# Patient Record
Sex: Female | Born: 1950
Health system: Southern US, Community
[De-identification: ages and names within clinical notes are randomized; demographics above are authoritative.]

## PROBLEM LIST (undated history)

## (undated) DIAGNOSIS — G629 Polyneuropathy, unspecified: Secondary | ICD-10-CM

## (undated) DIAGNOSIS — D649 Anemia, unspecified: Secondary | ICD-10-CM

## (undated) DIAGNOSIS — K5792 Diverticulitis of intestine, part unspecified, without perforation or abscess without bleeding: Secondary | ICD-10-CM

## (undated) DIAGNOSIS — F32A Depression, unspecified: Secondary | ICD-10-CM

## (undated) DIAGNOSIS — M869 Osteomyelitis, unspecified: Secondary | ICD-10-CM

## (undated) DIAGNOSIS — E11319 Type 2 diabetes mellitus with unspecified diabetic retinopathy without macular edema: Secondary | ICD-10-CM

## (undated) DIAGNOSIS — Z87442 Personal history of urinary calculi: Secondary | ICD-10-CM

## (undated) DIAGNOSIS — M5136 Other intervertebral disc degeneration, lumbar region: Secondary | ICD-10-CM

## (undated) DIAGNOSIS — E559 Vitamin D deficiency, unspecified: Secondary | ICD-10-CM

## (undated) DIAGNOSIS — N189 Chronic kidney disease, unspecified: Secondary | ICD-10-CM

## (undated) DIAGNOSIS — I10 Essential (primary) hypertension: Secondary | ICD-10-CM

## (undated) DIAGNOSIS — M51369 Other intervertebral disc degeneration, lumbar region without mention of lumbar back pain or lower extremity pain: Secondary | ICD-10-CM

## (undated) DIAGNOSIS — I6381 Other cerebral infarction due to occlusion or stenosis of small artery: Secondary | ICD-10-CM

## (undated) DIAGNOSIS — E538 Deficiency of other specified B group vitamins: Secondary | ICD-10-CM

## (undated) DIAGNOSIS — F329 Major depressive disorder, single episode, unspecified: Secondary | ICD-10-CM

## (undated) DIAGNOSIS — Z972 Presence of dental prosthetic device (complete) (partial): Secondary | ICD-10-CM

## (undated) HISTORY — DX: Deficiency of other specified B group vitamins: E53.8

## (undated) HISTORY — DX: Other cerebral infarction due to occlusion or stenosis of small artery: I63.81

## (undated) HISTORY — PX: TOE AMPUTATION: SHX809

## (undated) HISTORY — DX: Polyneuropathy, unspecified: G62.9

## (undated) HISTORY — DX: Other intervertebral disc degeneration, lumbar region without mention of lumbar back pain or lower extremity pain: M51.369

## (undated) HISTORY — DX: Other intervertebral disc degeneration, lumbar region: M51.36

---
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
## 1997-12-05 ENCOUNTER — Emergency Department (HOSPITAL_COMMUNITY): Admission: EM | Admit: 1997-12-05 | Discharge: 1997-12-05 | Payer: Self-pay | Admitting: Emergency Medicine

## 1997-12-05 ENCOUNTER — Encounter: Payer: Self-pay | Admitting: Emergency Medicine

## 1998-09-02 ENCOUNTER — Emergency Department (HOSPITAL_COMMUNITY): Admission: EM | Admit: 1998-09-02 | Discharge: 1998-09-02 | Payer: Self-pay | Admitting: *Deleted

## 1999-04-19 ENCOUNTER — Encounter: Payer: Self-pay | Admitting: Emergency Medicine

## 1999-04-19 ENCOUNTER — Emergency Department (HOSPITAL_COMMUNITY): Admission: EM | Admit: 1999-04-19 | Discharge: 1999-04-19 | Payer: Self-pay | Admitting: Emergency Medicine

## 2002-07-11 ENCOUNTER — Emergency Department (HOSPITAL_COMMUNITY): Admission: EM | Admit: 2002-07-11 | Discharge: 2002-07-11 | Payer: Self-pay | Admitting: Emergency Medicine

## 2002-07-11 ENCOUNTER — Encounter: Payer: Self-pay | Admitting: Emergency Medicine

## 2002-07-12 ENCOUNTER — Ambulatory Visit (HOSPITAL_COMMUNITY): Admission: RE | Admit: 2002-07-12 | Discharge: 2002-07-12 | Payer: Self-pay | Admitting: Emergency Medicine

## 2002-07-12 ENCOUNTER — Encounter: Payer: Self-pay | Admitting: Emergency Medicine

## 2008-07-05 ENCOUNTER — Encounter: Admission: RE | Admit: 2008-07-05 | Discharge: 2008-07-05 | Payer: Self-pay | Admitting: Family Medicine

## 2009-01-05 ENCOUNTER — Inpatient Hospital Stay (HOSPITAL_COMMUNITY): Admission: EM | Admit: 2009-01-05 | Discharge: 2009-01-14 | Payer: Self-pay | Admitting: Emergency Medicine

## 2009-01-07 ENCOUNTER — Encounter (INDEPENDENT_AMBULATORY_CARE_PROVIDER_SITE_OTHER): Payer: Self-pay | Admitting: Specialist

## 2010-04-13 ENCOUNTER — Inpatient Hospital Stay (HOSPITAL_COMMUNITY)
Admission: EM | Admit: 2010-04-13 | Discharge: 2010-04-15 | DRG: 637 | Disposition: A | Discharge status: Home / Self Care | Payer: Managed Care, Other (non HMO) | Attending: Internal Medicine | Admitting: Internal Medicine

## 2010-04-13 ENCOUNTER — Emergency Department (HOSPITAL_COMMUNITY): Payer: Managed Care, Other (non HMO)

## 2010-04-13 ENCOUNTER — Emergency Department (HOSPITAL_COMMUNITY): Payer: Managed Care, Other (non HMO) | Attending: Emergency Medicine

## 2010-04-13 DIAGNOSIS — Z9119 Patient's noncompliance with other medical treatment and regimen: Secondary | ICD-10-CM

## 2010-04-13 DIAGNOSIS — D649 Anemia, unspecified: Secondary | ICD-10-CM | POA: Diagnosis present

## 2010-04-13 DIAGNOSIS — R5381 Other malaise: Secondary | ICD-10-CM | POA: Insufficient documentation

## 2010-04-13 DIAGNOSIS — E876 Hypokalemia: Secondary | ICD-10-CM | POA: Diagnosis present

## 2010-04-13 DIAGNOSIS — E119 Type 2 diabetes mellitus without complications: Secondary | ICD-10-CM | POA: Insufficient documentation

## 2010-04-13 DIAGNOSIS — E871 Hypo-osmolality and hyponatremia: Secondary | ICD-10-CM | POA: Diagnosis present

## 2010-04-13 DIAGNOSIS — Z91199 Patient's noncompliance with other medical treatment and regimen due to unspecified reason: Secondary | ICD-10-CM

## 2010-04-13 DIAGNOSIS — N390 Urinary tract infection, site not specified: Secondary | ICD-10-CM | POA: Diagnosis present

## 2010-04-13 DIAGNOSIS — R4182 Altered mental status, unspecified: Secondary | ICD-10-CM | POA: Insufficient documentation

## 2010-04-13 DIAGNOSIS — E8809 Other disorders of plasma-protein metabolism, not elsewhere classified: Secondary | ICD-10-CM | POA: Diagnosis present

## 2010-04-13 DIAGNOSIS — R748 Abnormal levels of other serum enzymes: Secondary | ICD-10-CM | POA: Diagnosis present

## 2010-04-13 DIAGNOSIS — F29 Unspecified psychosis not due to a substance or known physiological condition: Secondary | ICD-10-CM | POA: Insufficient documentation

## 2010-04-13 DIAGNOSIS — J40 Bronchitis, not specified as acute or chronic: Secondary | ICD-10-CM | POA: Diagnosis present

## 2010-04-13 DIAGNOSIS — G9349 Other encephalopathy: Secondary | ICD-10-CM | POA: Diagnosis present

## 2010-04-13 DIAGNOSIS — IMO0001 Reserved for inherently not codable concepts without codable children: Principal | ICD-10-CM | POA: Diagnosis present

## 2010-04-13 DIAGNOSIS — R5383 Other fatigue: Secondary | ICD-10-CM | POA: Insufficient documentation

## 2010-04-13 LAB — AMMONIA: Ammonia: <8 umol/L — ABNORMAL LOW (ref 11–35)

## 2010-04-13 LAB — BLOOD GAS, ARTERIAL
Bicarbonate: 23.3 mEq/L (ref 20.0–24.0)
O2 Saturation: 93.3 %
pO2, Arterial: 62.8 mmHg — ABNORMAL LOW (ref 80.0–100.0)

## 2010-04-13 LAB — ACETAMINOPHEN LEVEL: Acetaminophen (Tylenol), Serum: <10 ug/mL — ABNORMAL LOW (ref 10–30)

## 2010-04-13 LAB — GLUCOSE, CAPILLARY
Glucose-Capillary: 314 mg/dL — ABNORMAL HIGH (ref 70–99)
Glucose-Capillary: 256 mg/dL — ABNORMAL HIGH (ref 70–99)
Glucose-Capillary: 259 mg/dL — ABNORMAL HIGH (ref 70–99)

## 2010-04-13 LAB — DIFFERENTIAL
Basophils Absolute: 0 10*3/uL (ref 0.0–0.1)
Lymphocytes Relative: 12 % (ref 12–46)
Monocytes Absolute: 0.4 10*3/uL (ref 0.1–1.0)
Neutro Abs: 7.4 10*3/uL (ref 1.7–7.7)

## 2010-04-13 LAB — URINE MICROSCOPIC-ADD ON

## 2010-04-13 LAB — HEPATIC FUNCTION PANEL
ALT: 8 U/L (ref 0–35)
Bilirubin, Direct: 0.1 mg/dL (ref 0.0–0.3)
Indirect Bilirubin: 0.8 mg/dL (ref 0.3–0.9)

## 2010-04-13 LAB — BASIC METABOLIC PANEL
Calcium: 9.4 mg/dL (ref 8.4–10.5)
Creatinine, Ser: 0.68 mg/dL (ref 0.4–1.2)
GFR calc Af Amer: >60 mL/min (ref >60)

## 2010-04-13 LAB — URINALYSIS, ROUTINE W REFLEX MICROSCOPIC
Specific Gravity, Urine: 1.031 — ABNORMAL HIGH (ref 1.005–1.030)
Urine Glucose, Fasting: >1000 mg/dL — AB

## 2010-04-13 LAB — CBC
HCT: 38.8 % (ref 36.0–46.0)
Hemoglobin: 12.8 g/dL (ref 12.0–15.0)
MCHC: 33 g/dL (ref 30.0–36.0)
WBC: 9 10*3/uL (ref 4.0–10.5)

## 2010-04-13 LAB — CK TOTAL AND CKMB (NOT AT ARMC)
Relative Index: INVALID (ref 0.0–2.5)
Total CK: <7 U/L — ABNORMAL LOW (ref 7–177)

## 2010-04-13 LAB — BRAIN NATRIURETIC PEPTIDE: Pro B Natriuretic peptide (BNP): 35.5 pg/mL (ref 0.0–100.0)

## 2010-04-13 LAB — GLUCOSE, RANDOM: Glucose, Bld: 245 mg/dL — ABNORMAL HIGH (ref 70–99)

## 2010-04-14 LAB — GLUCOSE, CAPILLARY
Glucose-Capillary: 202 mg/dL — ABNORMAL HIGH (ref 70–99)
Glucose-Capillary: 240 mg/dL — ABNORMAL HIGH (ref 70–99)
Glucose-Capillary: 292 mg/dL — ABNORMAL HIGH (ref 70–99)

## 2010-04-14 LAB — COMPREHENSIVE METABOLIC PANEL
ALT: <8 U/L (ref 0–35)
AST: 6 U/L (ref 0–37)
Albumin: 2.1 g/dL — ABNORMAL LOW (ref 3.5–5.2)
CO2: 23 mEq/L (ref 19–32)
Calcium: 8 mg/dL — ABNORMAL LOW (ref 8.4–10.5)
Chloride: 100 mEq/L (ref 96–112)
Creatinine, Ser: 0.52 mg/dL (ref 0.4–1.2)
GFR calc Af Amer: >60 mL/min (ref >60)
GFR calc non Af Amer: >60 mL/min (ref >60)
Sodium: 132 mEq/L — ABNORMAL LOW (ref 135–145)

## 2010-04-14 LAB — CBC
Hemoglobin: 11.1 g/dL — ABNORMAL LOW (ref 12.0–15.0)
MCHC: 32.6 g/dL (ref 30.0–36.0)
Platelets: 273 10*3/uL (ref 150–400)
RBC: 3.85 MIL/uL — ABNORMAL LOW (ref 3.87–5.11)

## 2010-04-14 LAB — DIFFERENTIAL
Basophils Absolute: 0 10*3/uL (ref 0.0–0.1)
Eosinophils Absolute: 0.1 10*3/uL (ref 0.0–0.7)
Lymphocytes Relative: 20 % (ref 12–46)
Monocytes Relative: 7 % (ref 3–12)

## 2010-04-14 LAB — TSH: TSH: 2.023 u[IU]/mL (ref 0.350–4.500)

## 2010-04-14 LAB — URINE CULTURE

## 2010-04-14 LAB — HEMOGLOBIN A1C: Mean Plasma Glucose: 315 mg/dL — ABNORMAL HIGH (ref <117)

## 2010-04-15 LAB — CBC
HCT: 32.6 % — ABNORMAL LOW (ref 36.0–46.0)
Hemoglobin: 10.9 g/dL — ABNORMAL LOW (ref 12.0–15.0)
MCH: 29.7 pg (ref 26.0–34.0)
MCHC: 33.4 g/dL (ref 30.0–36.0)
MCV: 88.8 fL (ref 78.0–100.0)
RBC: 3.67 MIL/uL — ABNORMAL LOW (ref 3.87–5.11)

## 2010-04-15 LAB — BASIC METABOLIC PANEL
CO2: 25 mEq/L (ref 19–32)
Calcium: 8.1 mg/dL — ABNORMAL LOW (ref 8.4–10.5)
Chloride: 101 mEq/L (ref 96–112)
Creatinine, Ser: 0.45 mg/dL (ref 0.4–1.2)
GFR calc Af Amer: >60 mL/min (ref >60)
Glucose, Bld: 316 mg/dL — ABNORMAL HIGH (ref 70–99)

## 2010-04-15 LAB — GLUCOSE, CAPILLARY
Glucose-Capillary: 312 mg/dL — ABNORMAL HIGH (ref 70–99)
Glucose-Capillary: 284 mg/dL — ABNORMAL HIGH (ref 70–99)

## 2010-04-15 LAB — FOLATE RBC: RBC Folate: 887 ng/mL — ABNORMAL HIGH (ref 180–600)

## 2010-04-18 NOTE — Discharge Summary (Signed)
Bailey [name], Bailey [name]                ACCOUNT NO.:  [id number]  MEDICAL RECORD NO.:  [id number]           PATIENT TYPE:  I  LOCATION:  0973                         FACILITY:  [facility]  PHYSICIAN:  Jacquelynn Cree, M.D.   DATE OF BIRTH:  [birth year]  DATE OF ADMISSION:  04/13/2010 DATE OF DISCHARGE:  04/15/2010                              DISCHARGE SUMMARY   PRIMARY CARE PHYSICIAN:  [name], M.D.  The patient states she has not followed up with Dr. Ernie Hew because of losing her insurance.  The patient will be referred to the [facility].  DISCHARGE DIAGNOSES: 1. Altered mental status secondary to hyperglycemic encephalopathy. 2. Polymicrobial urinary tract infection. 3. Uncontrolled type 2 diabetes. 4. Mild elevation of alkaline phosphatase. 5. Hypokalemia. 6. Hyponatremia. 7. Hypoalbuminemia. 8. Bronchitis. 9. Mild normocytic anemia.  DISCHARGE MEDICATIONS: 1. Insulin 70/30, 15 units subcutaneously b.i.d. 2. Azithromycin 250 mg p.o. daily. 3. Aleve 220 mg p.o. q.8 h. p.r.n. headache.  CONSULTATIONS:  None.  BRIEF ADMISSION HISTORY OF PRESENT ILLNESS:  The patient is a 60 year old female with past medical history of type 2 diabetes, who presented to the hospital with alteration in her mental status.  The patient has not been taking care of her diabetes and upon initial evaluation, had a marked elevation of her blood glucose with a serum glucose documented as 351.  She subsequently was referred to the Hospitalist Service for further evaluation and treatment.  For the full details, please see the dictated report done by Dr. Maudie Mercury.  PROCEDURES AND DIAGNOSTIC STUDIES: 1. CT scan of the head on April 13, 2010, was negative for bleed or     other acute intracranial process. 2. Chest x-ray on April 13, 2010, showed vascular congestion with     possible mild edema and no focal airspace disease.  DISCHARGE LABORATORY VALUES:  RBC folate was 887.  ANA was  negative. Magnesium was 1.9.  Sodium was 132, potassium 4.2, chloride 101, bicarb 25, BUN 5, creatinine 0.45, glucose 316, calcium 8.1.  White blood cell count was 8.4, hemoglobin 10.9, hematocrit 32.6, platelets 261.  Urine cultures grew multiple bacterial morphotypes.  Hemoglobin A1c was 12.6%. RPR was nonreactive.  Vitamin B12 was 292.  TSH was 2.023.  ESR was 108. BNP was 35.5.  Cardiac markers were negative.  Salicylate level was less than 4.  Alcohol level was less than 5.  Acetaminophen level was less than 10.  Ammonia level was less than 8.  HOSPITAL COURSE BY PROBLEM: 1. Altered mental status:  The patient was admitted and her husband     described delusional type behaviors.  A diagnostic evaluation for     altered mental status was undertaken and the findings were     unrevealing.  It was felt that the patient's mental status change     is most likely due to encephalopathy induced by significant     hyperglycemia.  The patient had no changes in her mental status     noted once her sugars came down. 2. Polymicrobial urinary tract infection:  The patient was treated     with Rocephin  for 2 days which should be adequate given that this     was a polymicrobial and asymptomatic infection. 3. Uncontrolled type 2 diabetes:  The patient has been noncompliant     with medications secondary to cost issues.  She was initially put     on GlucoStabilizer and insulin drip.  She was subsequently seen by     the inpatient diabetes program coordinator and recommendations were     made to switch her to 70/30 insulin which was done.  The patient     will be discharged on 15 units of 70/30 b.i.d. with instructions to     increase her insulin by 5 units b.i.d. if her sugars are     consistently above 250.  She is also provided information as to     follow up with the Public Health Department at the [facility] and provided with their number with instructions to call     given her  problems affording her treatment. 4. Hypokalemia:  The patient was fully repleted with potassium. 5. Hyponatremia:  Felt to be artifactual related to her hyperglycemia. 6. Hypoalbuminemia:  Likely related to poor nutritional intake. 7. Bronchitis:  The patient was treated with Rocephin and     azithromycin.  She will be discharged on additional 3 days of     therapy with azithromycin. 8. Mild normocytic anemia:  No further diagnostic evaluation was done     other than to check her vitamin B12 and serum folate levels which     were within normal range.  DISPOSITION:  The patient is medically stable and will be discharged home.  CONDITION AT DISCHARGE:  Improved.  Time spent coordinating care for discharge and discharge instructions including face-to-face time equals 25 minutes.     Jacquelynn Cree, M.D.     CR/MEDQ  D:  04/15/2010  T:  04/16/2010  Job:  [id number]  cc:   [facility]  [name], M.D. Fax: [phone]  Electronically Signed by Jacquelynn Cree M.D. on 04/18/2010 03:12:50 PM

## 2010-05-14 NOTE — H&P (Signed)
[name], [name]                ACCOUNT NO.:  [id number]  MEDICAL RECORD NO.:  [id number]           PATIENT TYPE:  E  LOCATION:  WLED                         FACILITY:  [facility]  PHYSICIAN:  Arlyss Repress, MD        DATE OF BIRTH:  [birth year]  DATE OF ADMISSION:  04/13/2010 DATE OF DISCHARGE:                             HISTORY & PHYSICAL   PRIMARY CARE PHYSICIAN:  [name], M.D.  CHIEF COMPLAINT:  Altered mental status.  HISTORY OF PRESENT ILLNESS:  This 60 year old female with type 2 diabetes apparently seem delusional according to her husband.  She seems slightly disoriented but denied any focal weakness, numbness, tingling, seizure-like activity.  Apparently, they checked her blood sugar at home and it was 480.  Apparently, she has been using some form of insulin to control her sugar but may have been somewhat noncompliant.  The patient was evaluated in the ED and CT brain was negative for any acute process. Chest x-ray showed question of mild vascular congestion and BNP is still pending.  EKG showed normal sinus rhythm at 100, normal axis, no ST-T segment changes consistent with acute ischemia.  Urinalysis did show evidence of a urinary tract infection.  The patient was not acidotic and cardiac markers were negative.  The patient appeared to be dehydrated as well.  Specific gravity was high.  The patient will be admitted for altered mental status.  PAST MEDICAL HISTORY: 1. Type 2 diabetes. 2. Left first toe osteomyelitis of the proximal and distal phalanx 3. Sigmoid colonic diverticulosis on CT scan Jul 11, 2002, small cyst     of the left ovary, seen on CT scan Jul 11, 2002 with followup     ultrasound Jul 12, 2002.  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  SOCIAL HISTORY:  The patient is married and has 3 children.  She was born in [place], [place].  She used to work for [employer] but has been laid off.  She does not smoke or drink.  She does not do drugs.  Mother died at age 40 of old age but was a diabetic.  Father died at age 30 of old age.  One sister died at age 70 of breast cancer and was a nonsmoker.  REVIEW OF SYSTEMS:  Positive for GERD and dysphagia to solid foods. This has been going on for about a year.  Review of systems is otherwise negative for all 10 organ systems except for pertinent positives stated above.  ALLERGIES:  No known drug allergies.  MEDICATIONS:  Unknown, the patient cannot recall.  PHYSICAL EXAMINATION:  VITAL SIGNS:  Temperature 99.3, pulse 108, blood pressure 150/85, pulse ox is 95% on room air. HEENT:  Anicteric. NECK:  No JVD, no bruit. HEART:  Regular rate and rhythm.  S1, S2.  No murmurs, gallops or rubs. LUNGS:  Slight crackles at the left base.  No wheezes. ABDOMEN:  Soft, obese, nontender, nondistended.  Positive bowel sounds. EXTREMITIES:  No cyanosis, clubbing or edema. SKIN:  No rashes. LYMPH NODES:  No adenopathy.  NEURO EXAM:  Nonfocal.  Cranial nerves II through XII intact.  Reflexes 2+, symmetric, diffuse with downgoing toes bilaterally, motor strength 5/5 in all 4 extremities, pinprick intact.  LABORATORY DATA:  ABG, pH 7.482, pCO2 of 31.5, pO2 of 62.8.  WBC 9.0, hemoglobin 12.8, platelet count 326.  Urinalysis shows ketones greater than 80, specific gravity 1.031, nitrite positive, leuk esterase positive, wbcs 11 to 20.  Sodium 132, potassium 3.9, BUN 11, creatinine 0.68.  Ammonia less than 8.  Tylenol level less than 10.  AST 8, ALT 8, alk phos 148, total bilirubin 0.9.  Alcohol level less than 5.  CPK less than 7, MB 0.3, troponin-I less than 0.01.  CT brain negative for bleed or other acute intracranial process.  Chest x-ray, vascular congestion with possible mild edema.  ASSESSMENT AND PLAN: 1. Altered mental status:  Check M40, folic acid, ESR, ANA, RPR, TSH.     This may be possibly  due to hyperglycemia or urinary tract     infection. 2. Cough, ? mild vascular congestion, possible edema:  The patient may     have bronchitis or trace of pneumonia at the left lung base.  The     patient will be covered with Zithromax. 3. Urinary tract infection:  Ceftriaxone 1 g IV daily. 4. Diabetes type 2:  Fingerstick blood sugars initially q.4 h. and we     will keep the patient n.p.o. until blood sugars are less than 200     and then can transition to a.c. and h.s. with NovoLog sensitive     sliding scale. 5. Abnormal liver function tests:  Repeat a CMP in the a.m.  If alk     phos is still high, then consider a GGT. 6. History of cystic left ovary:  Please follow up with Dr. [name] for this. 7. DVT prophylaxis, SCDs.     Arlyss Repress, MD     JYK/MEDQ  D:  04/13/2010  T:  04/13/2010  Job:  [id number]  cc:   [name], M.D. Fax: [phone]  Electronically Signed by Jani Gravel MD on 05/14/2010 10:28:53 PM

## 2010-05-28 LAB — BASIC METABOLIC PANEL
BUN: 1 mg/dL — ABNORMAL LOW (ref 6–23)
BUN: 2 mg/dL — ABNORMAL LOW (ref 6–23)
BUN: 2 mg/dL — ABNORMAL LOW (ref 6–23)
BUN: 4 mg/dL — ABNORMAL LOW (ref 6–23)
BUN: 6 mg/dL (ref 6–23)
BUN: 5 mg/dL — ABNORMAL LOW (ref 6–23)
BUN: 5 mg/dL — ABNORMAL LOW (ref 6–23)
CO2: 27 mEq/L (ref 19–32)
CO2: 29 mEq/L (ref 19–32)
CO2: 29 mEq/L (ref 19–32)
CO2: 25 mEq/L (ref 19–32)
CO2: 25 mEq/L (ref 19–32)
Calcium: 7.8 mg/dL — ABNORMAL LOW (ref 8.4–10.5)
Calcium: 7.9 mg/dL — ABNORMAL LOW (ref 8.4–10.5)
Calcium: 8.4 mg/dL (ref 8.4–10.5)
Calcium: 8.5 mg/dL (ref 8.4–10.5)
Calcium: 7.8 mg/dL — ABNORMAL LOW (ref 8.4–10.5)
Calcium: 9.1 mg/dL (ref 8.4–10.5)
Chloride: 102 mEq/L (ref 96–112)
Chloride: 104 mEq/L (ref 96–112)
Chloride: 105 mEq/L (ref 96–112)
Chloride: 105 mEq/L (ref 96–112)
Chloride: 106 mEq/L (ref 96–112)
Chloride: 108 mEq/L (ref 96–112)
Chloride: 105 mEq/L (ref 96–112)
Creatinine, Ser: 0.46 mg/dL (ref 0.4–1.2)
Creatinine, Ser: 0.46 mg/dL (ref 0.4–1.2)
Creatinine, Ser: 0.46 mg/dL (ref 0.4–1.2)
Creatinine, Ser: 0.48 mg/dL (ref 0.4–1.2)
Creatinine, Ser: 0.56 mg/dL (ref 0.4–1.2)
Creatinine, Ser: 0.34 mg/dL — ABNORMAL LOW (ref 0.4–1.2)
Creatinine, Ser: 0.35 mg/dL — ABNORMAL LOW (ref 0.4–1.2)
Creatinine, Ser: 0.45 mg/dL (ref 0.4–1.2)
GFR calc Af Amer: >60 mL/min (ref >60)
GFR calc Af Amer: >60 mL/min (ref >60)
GFR calc Af Amer: >60 mL/min (ref >60)
GFR calc Af Amer: >60 mL/min (ref >60)
GFR calc Af Amer: >60 mL/min (ref >60)
GFR calc non Af Amer: >60 mL/min (ref >60)
GFR calc non Af Amer: >60 mL/min (ref >60)
GFR calc non Af Amer: >60 mL/min (ref >60)
Glucose, Bld: 178 mg/dL — ABNORMAL HIGH (ref 70–99)
Glucose, Bld: 196 mg/dL — ABNORMAL HIGH (ref 70–99)
Glucose, Bld: 227 mg/dL — ABNORMAL HIGH (ref 70–99)
Glucose, Bld: 207 mg/dL — ABNORMAL HIGH (ref 70–99)
Potassium: 3.5 mEq/L (ref 3.5–5.1)
Potassium: 3.6 mEq/L (ref 3.5–5.1)
Potassium: 3.9 mEq/L (ref 3.5–5.1)
Potassium: 3.6 mEq/L (ref 3.5–5.1)
Sodium: 141 mEq/L (ref 135–145)
Sodium: 132 mEq/L — ABNORMAL LOW (ref 135–145)

## 2010-05-28 LAB — ANAEROBIC CULTURE

## 2010-05-28 LAB — CBC
HCT: 28.1 % — ABNORMAL LOW (ref 36.0–46.0)
HCT: 29.2 % — ABNORMAL LOW (ref 36.0–46.0)
HCT: 29.3 % — ABNORMAL LOW (ref 36.0–46.0)
HCT: 30.9 % — ABNORMAL LOW (ref 36.0–46.0)
HCT: 31.3 % — ABNORMAL LOW (ref 36.0–46.0)
Hemoglobin: 12.9 g/dL (ref 12.0–15.0)
MCHC: 33 g/dL (ref 30.0–36.0)
MCHC: 33.2 g/dL (ref 30.0–36.0)
MCHC: 33.2 g/dL (ref 30.0–36.0)
MCHC: 33.2 g/dL (ref 30.0–36.0)
MCHC: 34 g/dL (ref 30.0–36.0)
MCHC: 35.1 g/dL (ref 30.0–36.0)
MCV: 86.8 fL (ref 78.0–100.0)
MCV: 87.1 fL (ref 78.0–100.0)
MCV: 87.5 fL (ref 78.0–100.0)
MCV: 87.5 fL (ref 78.0–100.0)
MCV: 87.6 fL (ref 78.0–100.0)
MCV: 87.3 fL (ref 78.0–100.0)
Platelets: 248 10*3/uL (ref 150–400)
Platelets: 258 10*3/uL (ref 150–400)
Platelets: 273 10*3/uL (ref 150–400)
Platelets: 276 10*3/uL (ref 150–400)
Platelets: 293 10*3/uL (ref 150–400)
Platelets: 279 10*3/uL (ref 150–400)
Platelets: 317 10*3/uL (ref 150–400)
RBC: 3.07 MIL/uL — ABNORMAL LOW (ref 3.87–5.11)
RBC: 3.24 MIL/uL — ABNORMAL LOW (ref 3.87–5.11)
RBC: 3.34 MIL/uL — ABNORMAL LOW (ref 3.87–5.11)
RBC: 4.35 MIL/uL (ref 3.87–5.11)
RDW: 13.8 % (ref 11.5–15.5)
RDW: 14 % (ref 11.5–15.5)
RDW: 14.6 % (ref 11.5–15.5)
RDW: 12.8 % (ref 11.5–15.5)
RDW: 12.9 % (ref 11.5–15.5)
RDW: 13.1 % (ref 11.5–15.5)
WBC: 4.5 10*3/uL (ref 4.0–10.5)
WBC: 4.9 10*3/uL (ref 4.0–10.5)

## 2010-05-28 LAB — GLUCOSE, CAPILLARY
Glucose-Capillary: 113 mg/dL — ABNORMAL HIGH (ref 70–99)
Glucose-Capillary: 156 mg/dL — ABNORMAL HIGH (ref 70–99)
Glucose-Capillary: 162 mg/dL — ABNORMAL HIGH (ref 70–99)
Glucose-Capillary: 178 mg/dL — ABNORMAL HIGH (ref 70–99)
Glucose-Capillary: 181 mg/dL — ABNORMAL HIGH (ref 70–99)
Glucose-Capillary: 190 mg/dL — ABNORMAL HIGH (ref 70–99)
Glucose-Capillary: 203 mg/dL — ABNORMAL HIGH (ref 70–99)
Glucose-Capillary: 205 mg/dL — ABNORMAL HIGH (ref 70–99)
Glucose-Capillary: 206 mg/dL — ABNORMAL HIGH (ref 70–99)
Glucose-Capillary: 214 mg/dL — ABNORMAL HIGH (ref 70–99)
Glucose-Capillary: 215 mg/dL — ABNORMAL HIGH (ref 70–99)
Glucose-Capillary: 227 mg/dL — ABNORMAL HIGH (ref 70–99)
Glucose-Capillary: 244 mg/dL — ABNORMAL HIGH (ref 70–99)
Glucose-Capillary: 250 mg/dL — ABNORMAL HIGH (ref 70–99)
Glucose-Capillary: 256 mg/dL — ABNORMAL HIGH (ref 70–99)
Glucose-Capillary: 98 mg/dL (ref 70–99)
Glucose-Capillary: 130 mg/dL — ABNORMAL HIGH (ref 70–99)
Glucose-Capillary: 145 mg/dL — ABNORMAL HIGH (ref 70–99)
Glucose-Capillary: 183 mg/dL — ABNORMAL HIGH (ref 70–99)
Glucose-Capillary: 220 mg/dL — ABNORMAL HIGH (ref 70–99)
Glucose-Capillary: 274 mg/dL — ABNORMAL HIGH (ref 70–99)
Glucose-Capillary: 315 mg/dL — ABNORMAL HIGH (ref 70–99)
Glucose-Capillary: 339 mg/dL — ABNORMAL HIGH (ref 70–99)
Glucose-Capillary: 356 mg/dL — ABNORMAL HIGH (ref 70–99)
Glucose-Capillary: 469 mg/dL — ABNORMAL HIGH (ref 70–99)

## 2010-05-28 LAB — FERRITIN: Ferritin: 258 ng/mL (ref 10–291)

## 2010-05-28 LAB — TISSUE CULTURE

## 2010-05-28 LAB — PROTIME-INR: Prothrombin Time: 14.8 seconds (ref 11.6–15.2)

## 2010-05-28 LAB — DIFFERENTIAL
Basophils Absolute: 0 10*3/uL (ref 0.0–0.1)
Basophils Absolute: 0 10*3/uL (ref 0.0–0.1)
Basophils Relative: 0 % (ref 0–1)
Basophils Relative: 0 % (ref 0–1)
Eosinophils Absolute: 0.1 10*3/uL (ref 0.0–0.7)
Eosinophils Relative: 3 % (ref 0–5)
Lymphocytes Relative: 28 % (ref 12–46)
Monocytes Absolute: 0.5 10*3/uL (ref 0.1–1.0)
Monocytes Relative: 6 % (ref 3–12)
Neutro Abs: 3.5 10*3/uL (ref 1.7–7.7)
Neutro Abs: 5.3 10*3/uL (ref 1.7–7.7)
Neutrophils Relative %: 59 % (ref 43–77)
Neutrophils Relative %: 65 % (ref 43–77)

## 2010-05-28 LAB — LIPID PANEL
Total CHOL/HDL Ratio: 6.6 RATIO
Triglycerides: 238 mg/dL — ABNORMAL HIGH (ref <150)
VLDL: 48 mg/dL — ABNORMAL HIGH (ref 0–40)

## 2010-05-28 LAB — HEMOGLOBIN A1C
Hgb A1c MFr Bld: 13.5 % — ABNORMAL HIGH (ref 4.6–6.1)
Hgb A1c MFr Bld: 13.9 % — ABNORMAL HIGH (ref 4.6–6.1)
Mean Plasma Glucose: 341 mg/dL

## 2010-05-28 LAB — IRON AND TIBC
TIBC: 175 ug/dL — ABNORMAL LOW (ref 250–470)
UIBC: 156 ug/dL

## 2010-05-28 LAB — FOLATE: Folate: 19.8 ng/mL

## 2010-05-28 LAB — MAGNESIUM: Magnesium: 1.7 mg/dL (ref 1.5–2.5)

## 2010-05-28 LAB — RETICULOCYTES: Retic Count, Absolute: 71.6 10*3/uL (ref 19.0–186.0)

## 2011-01-22 ENCOUNTER — Encounter: Payer: Self-pay | Admitting: *Deleted

## 2011-01-22 ENCOUNTER — Emergency Department (HOSPITAL_COMMUNITY)
Admission: EM | Admit: 2011-01-22 | Discharge: 2011-01-22 | Disposition: A | Discharge status: Other Acute Inpatient Hospital | Payer: Self-pay | Attending: Emergency Medicine | Admitting: Emergency Medicine

## 2011-01-22 DIAGNOSIS — E119 Type 2 diabetes mellitus without complications: Secondary | ICD-10-CM | POA: Insufficient documentation

## 2011-01-22 DIAGNOSIS — L02219 Cutaneous abscess of trunk, unspecified: Secondary | ICD-10-CM | POA: Insufficient documentation

## 2011-01-22 DIAGNOSIS — L02212 Cutaneous abscess of back [any part, except buttock]: Secondary | ICD-10-CM

## 2011-01-22 LAB — BASIC METABOLIC PANEL
BUN: 12 mg/dL (ref 6–23)
CO2: 23 mEq/L (ref 19–32)
Calcium: 9.5 mg/dL (ref 8.4–10.5)
Chloride: 93 mEq/L — ABNORMAL LOW (ref 96–112)
Creatinine, Ser: 0.42 mg/dL — ABNORMAL LOW (ref 0.50–1.10)
GFR calc Af Amer: >90 mL/min (ref >90)
GFR calc non Af Amer: >90 mL/min (ref >90)
Glucose, Bld: 382 mg/dL — ABNORMAL HIGH (ref 70–99)
Potassium: 3.7 mEq/L (ref 3.5–5.1)
Sodium: 128 mEq/L — ABNORMAL LOW (ref 135–145)

## 2011-01-22 LAB — DIFFERENTIAL
Basophils Absolute: 0 10*3/uL (ref 0.0–0.1)
Basophils Relative: 0 % (ref 0–1)
Eosinophils Absolute: 0.1 10*3/uL (ref 0.0–0.7)
Eosinophils Relative: 1 % (ref 0–5)
Lymphocytes Relative: 16 % (ref 12–46)
Lymphs Abs: 2.1 10*3/uL (ref 0.7–4.0)
Monocytes Absolute: 0.9 10*3/uL (ref 0.1–1.0)
Monocytes Relative: 7 % (ref 3–12)
Neutro Abs: 10.4 10*3/uL — ABNORMAL HIGH (ref 1.7–7.7)
Neutrophils Relative %: 77 % (ref 43–77)

## 2011-01-22 LAB — CBC
HCT: 40.3 % (ref 36.0–46.0)
Hemoglobin: 13.7 g/dL (ref 12.0–15.0)
MCH: 30.2 pg (ref 26.0–34.0)
MCHC: 34 g/dL (ref 30.0–36.0)
MCV: 88.8 fL (ref 78.0–100.0)
Platelets: 198 10*3/uL (ref 150–400)
RBC: 4.54 MIL/uL (ref 3.87–5.11)
RDW: 13.1 % (ref 11.5–15.5)
WBC: 13.5 10*3/uL — ABNORMAL HIGH (ref 4.0–10.5)

## 2011-01-22 MED ORDER — HYDROCODONE-ACETAMINOPHEN 5-325 MG PO TABS: 1.0000 | ORAL_TABLET | Freq: Four times a day (QID) | ORAL | Status: AC | PRN

## 2011-01-22 MED ORDER — LIDOCAINE HCL 2 % IJ SOLN
INTRAMUSCULAR | Status: AC
  Filled 2011-01-22: qty 1

## 2011-01-22 MED ORDER — FENTANYL CITRATE 0.05 MG/ML IJ SOLN
100.0000 ug | Freq: Once | INTRAMUSCULAR | Status: AC
  Administered 2011-01-22: 100 ug via INTRAVENOUS
  Filled 2011-01-22: qty 2

## 2011-01-22 MED ORDER — CLINDAMYCIN PHOSPHATE 900 MG/50ML IV SOLN
900.0000 mg | Freq: Once | INTRAVENOUS | Status: AC
  Administered 2011-01-22: 900 mg via INTRAVENOUS
  Filled 2011-01-22: qty 50

## 2011-01-22 MED ORDER — CLINDAMYCIN HCL 150 MG PO CAPS: 450.0000 mg | ORAL_CAPSULE | Freq: Three times a day (TID) | ORAL | Status: AC

## 2011-01-22 NOTE — ED Provider Notes (Signed)
Medical screening examination/treatment/procedure(s) were conducted as a shared visit with non-physician practitioner(s) and myself.  I personally evaluated the patient during the encounter  Pt with localized abscess without cellulitis with large amt of pus.  No systemic sx.  Blanchie Dessert, MD 01/22/11 [phone]

## 2011-01-22 NOTE — ED Provider Notes (Signed)
History     CSN: [id number] Arrival date & time: 01/22/2011  7:41 AM   First MD Initiated Contact with Patient 01/22/11 0815      Chief Complaint  Patient presents with  . Cyst    (Consider location/radiation/quality/duration/timing/severity/associated sxs/prior treatment) The history is provided by the patient.   SUBJECTIVE: Bailey [name] is a 60 y.o. female who presents with erythema, tenderness, pain, fever and swelling.  Location: center back between the shoulder blades  Onset: acute   Duration: 4 days and symptoms are worsening x 2 days Associated symptoms: fatigue Recent treatment: OTC medications and "boil ease" Functional status affected: no Pt states it began with itching in that spot and began swelling rather immediately.  Fever of 102 F last night documented by the patient, but resolved by this morning.  Home treatments have not resolved any symptoms.  No drainage noted. Pt is a known diabetic who has been noncompliant with medications due to financial strain for more than 1 yr.  Was taking metformin and insulin before losing insurance.  Pts home blood sugar was in the high 200's when last checked a few weeks ago.  Pt does not have a PCP at this time.  Hx of diabetic infection with L great toe amputation, no Hx of significant boil requiring I&D.  Denies CP, SOB, other lesions/rashes, weakness, dizziness, insomnia.  Admits to polyuria, polydipsia, mild anorexia.     Past Medical History  Diagnosis Date  . Diabetes mellitus     Past Surgical History  Procedure Date  . Toe amputation     left foot great toe    No family history on file.  History  Substance Use Topics  . Smoking status: Never Smoker   . Smokeless tobacco: Not on file  . Alcohol Use: No    OB History    Grav Para Term Preterm Abortions TAB SAB Ect Mult Living                  Review of Systems All pertinent positives and negatives in the history of present illness  Allergies  Review of  patient's allergies indicates no known allergies.  Home Medications   Current Outpatient Rx  Name Route Sig Dispense Refill  . ASPIRIN 500 MG PO TBEC Oral Take 500 mg by mouth every 6 (six) hours as needed. For pain     . BOIL-EASE EX Apply externally Apply 1 application topically 3 (three) times daily as needed. For back pain/irritaion     . DRAW OUT SALVE EX Apply externally Apply 1 application topically 3 (three) times daily as needed. For back pain/irritation     . INSULIN ASPART PROT & ASPART (70-30) 100 UNIT/ML Allison SUSP Subcutaneous Inject 15 Units into the skin daily.      Marland Kitchen METFORMIN HCL 500 MG PO TABS Oral Take 500 mg by mouth 2 (two) times daily with a meal.        BP 127/67  Pulse 94  Temp(Src) 98.7 F (37.1 C) (Oral)  Resp 14  Wt 150 lb (68.04 kg)  SpO2 97%  Physical Exam  Constitutional: She is oriented to person, place, and time. She appears well-developed and well-nourished.  HENT:  Head: Normocephalic and atraumatic.  Eyes: Pupils are equal, round, and reactive to light.  Neck: Normal range of motion.  Cardiovascular: Normal rate and regular rhythm.   Pulmonary/Chest: Effort normal and breath sounds normal.  Abdominal: Soft.  Neurological: She is alert and oriented to person, place, and  time. She has normal reflexes.  Skin: Skin is warm and dry. Lesion noted. She is not diaphoretic. There is erythema.     Psychiatric: She has a normal mood and affect. Her behavior is normal. Judgment and thought content normal.  LESION SIZE/LOCATION: 12-15cm LESION DESCRIPTION: erythema, swelling, ulceration, induration and fluctuant  ASSOCIATED SIGNS: none  SYSTEMIC SYMPTOMS: fatigue, fever CAPILLARY REFILL: Normal  ED Course  Procedures (including critical care time)  INCISION AND DRAINAGE Performed by: Brent General Consent: Verbal consent obtained. Risks and benefits: risks, benefits and alternatives were discussed Type: abscess  Body area: mid upper  back  Anesthesia: local infiltration  Local anesthetic: lidocaine 2% without epinephrine  Anesthetic total: 10 ml  Complexity: complex Blunt dissection to break up loculations  Drainage: purulent  Drainage amount: 25cc  Packing material: 1/2 in iodoform gauze  Patient tolerance: Patient tolerated the procedure well with no immediate complications.    Labs Reviewed  BASIC METABOLIC PANEL - Abnormal; Notable for the following:    Sodium 128 (*)    Chloride 93 (*)    Glucose, Bld 382 (*)    Creatinine, Ser 0.42 (*)    All other components within normal limits  CBC - Abnormal; Notable for the following:    WBC 13.5 (*)    All other components within normal limits  DIFFERENTIAL - Abnormal; Notable for the following:    Neutro Abs 10.4 (*)    All other components within normal limits     Large amount of pus drained from this abscess.  Patient is advised to return here in 2 days for a recheck.  Her blood sugars are not very well controlled and she states she cannot afford the medication.      [place], [place] 01/22/11 [phone]

## 2011-01-22 NOTE — ED Notes (Signed)
Pt states "it started bothering me probably last Sat, I used hot compresses on it, it was itching"; pt presents with large abcess to posterior neck region

## 2011-01-24 ENCOUNTER — Encounter (HOSPITAL_COMMUNITY): Payer: Self-pay | Admitting: *Deleted

## 2011-01-24 ENCOUNTER — Emergency Department (HOSPITAL_COMMUNITY)
Admission: EM | Admit: 2011-01-24 | Discharge: 2011-01-24 | Disposition: A | Discharge status: Home / Self Care | Payer: Self-pay | Attending: Emergency Medicine | Admitting: Emergency Medicine

## 2011-01-24 DIAGNOSIS — R739 Hyperglycemia, unspecified: Secondary | ICD-10-CM

## 2011-01-24 DIAGNOSIS — Z794 Long term (current) use of insulin: Secondary | ICD-10-CM | POA: Insufficient documentation

## 2011-01-24 DIAGNOSIS — L0390 Cellulitis, unspecified: Secondary | ICD-10-CM | POA: Insufficient documentation

## 2011-01-24 DIAGNOSIS — E119 Type 2 diabetes mellitus without complications: Secondary | ICD-10-CM | POA: Insufficient documentation

## 2011-01-24 DIAGNOSIS — L0291 Cutaneous abscess, unspecified: Secondary | ICD-10-CM | POA: Insufficient documentation

## 2011-01-24 DIAGNOSIS — Z09 Encounter for follow-up examination after completed treatment for conditions other than malignant neoplasm: Secondary | ICD-10-CM | POA: Insufficient documentation

## 2011-01-24 LAB — GLUCOSE, CAPILLARY: Glucose-Capillary: 162 mg/dL — ABNORMAL HIGH (ref 70–99)

## 2011-01-24 MED ORDER — METFORMIN HCL 500 MG PO TABS: 500.0000 mg | ORAL_TABLET | Freq: Two times a day (BID) | ORAL | Status: DC

## 2011-01-24 MED ORDER — SODIUM CHLORIDE 0.9 % IV BOLUS (SEPSIS)
500.0000 mL | Freq: Once | INTRAVENOUS | Status: AC
  Administered 2011-01-24: 500 mL via INTRAVENOUS

## 2011-01-24 MED ORDER — INSULIN ASPART 100 UNIT/ML ~~LOC~~ SOLN
10.0000 [IU] | Freq: Once | SUBCUTANEOUS | Status: AC
  Administered 2011-01-24: 10 [IU] via INTRAVENOUS
  Filled 2011-01-24: qty 1

## 2011-01-24 NOTE — ED Notes (Signed)
Pt here to have packing removed from upper back area

## 2011-01-24 NOTE — ED Notes (Signed)
CSW met with pt by bedside per request of RN. Pt informed Probation officer that she has been unemployed since August and is in need of medication assistance. CSW contacted in house CM who provided Probation officer with a list of medications that pt can receive through an assistance program at [employer]. Writer provided information that was given from in house CM to pt and also provided pt with information for [facility] for medical care. Pt verbalized no other concerns at this time. CSW will remain available to pt and provide assistance as needed.

## 2011-01-24 NOTE — ED Notes (Signed)
Social work at bedside.  

## 2011-01-24 NOTE — ED Notes (Signed)
Pt is DM II has not had her Insulin nor Metformin. States FBS this AM was >300. My concern is that with her BS elevated like this that we are going to have difficulty healing this infection.

## 2011-01-24 NOTE — ED Provider Notes (Signed)
Medical screening examination/treatment/procedure(s) were performed by non-physician practitioner and as supervising physician I was immediately available for consultation/collaboration.  Elmer Picker, MD 01/24/11 1544

## 2011-01-24 NOTE — ED Notes (Signed)
I&D 01/12/11 on back, returns for removal of packing. Continues to take ATB, has not taken pain medication since last PM @ 2100.

## 2011-01-24 NOTE — ED Notes (Signed)
Pt was here on Thursday last week for an abcess on her upper back, had a I & D and came back for recheck of wound, bs is 390, pt sts she los there job and can no longer afford her dm medications

## 2011-01-24 NOTE — ED Provider Notes (Signed)
History     CSN: [id number] Arrival date & time: 01/24/2011  8:48 AM   First MD Initiated Contact with Patient 01/24/11 1023      Chief Complaint  Patient presents with  . Wound Check    I&D on back 01/12/11    (Consider location/radiation/quality/duration/timing/severity/associated sxs/prior treatment) HPI Comments: Pt presents for wound check of her I &D done on 11/19.  Patient states she is taking her antibiotics as prescribed.  She denies fevers, night sweats, chills.  Her diabetes has been uncontrolled for the last couple of months and she is not taking her medication.  Her blood sugars have been running in the high 300s.  Patient did not have insurance or a PCP to followup with.  Patient is a 60 y.o. female presenting with wound check. The history is provided by the patient.  Wound Check     Past Medical History  Diagnosis Date  . Diabetes mellitus     Past Surgical History  Procedure Date  . Toe amputation     left foot great toe    No family history on file.  History  Substance Use Topics  . Smoking status: Never Smoker   . Smokeless tobacco: Not on file  . Alcohol Use: No    OB History    Grav Para Term Preterm Abortions TAB SAB Ect Mult Living                  Review of Systems  All other systems reviewed and are negative.    Allergies  Review of patient's allergies indicates no known allergies.  Home Medications   Current Outpatient Rx  Name Route Sig Dispense Refill  . ASPIRIN 500 MG PO TBEC Oral Take 500 mg by mouth every 6 (six) hours as needed. For pain     . BOIL-EASE EX Apply externally Apply 1 application topically 3 (three) times daily as needed. For back pain/irritaion     . CLINDAMYCIN HCL 150 MG PO CAPS Oral Take 3 capsules (450 mg total) by mouth 3 (three) times daily. If patient can't afford she will need Doxy 100 28(twenty-eight) 1 PO BID 90 capsule 0  . DRAW OUT SALVE EX Apply externally Apply 1 application topically 3 (three)  times daily as needed. For back pain/irritation     . HYDROCODONE-ACETAMINOPHEN 5-325 MG PO TABS Oral Take 1 tablet by mouth every 6 (six) hours as needed for pain. 15 tablet 0  . INSULIN ASPART PROT & ASPART (70-30) 100 UNIT/ML Ebony SUSP Subcutaneous Inject 15 Units into the skin daily.      Marland Kitchen METFORMIN HCL 500 MG PO TABS Oral Take 500 mg by mouth 2 (two) times daily with a meal.        BP 114/72  Pulse 81  Temp(Src) 98.7 F (37.1 C) (Oral)  Resp 16  SpO2 98%  Physical Exam  Nursing note and vitals reviewed. Constitutional: She is oriented to person, place, and time. She appears well-developed and well-nourished. No distress.  HENT:  Head: Normocephalic and atraumatic.  Eyes:       Normal appearance  Neck: Normal range of motion.  Neurological: She is alert and oriented to person, place, and time.  Skin:     Psychiatric: She has a normal mood and affect. Her behavior is normal.    ED Course  Procedures (including critical care time)  .Wound Recheck  Performed by: Verl Dicker Consent: Verbal consent obtained. Risks and benefits: risks, benefits and alternatives  were discussed Type: abscess Packing material: 1/4 in iodoform gauze Erythematous border not currently healing properly.    Discussed with patient the necessity to followup with the wound clinic in regards to the healing of her wound.  I will also be discharging the patient with metformin and informed her to go to [facility] because they have a special going on right now for free medication.  Patient verbalized her understanding and states she will do this.        Labs Reviewed  GLUCOSE, CAPILLARY - Abnormal; Notable for the following:    Glucose-Capillary 382 (*)    All other components within normal limits  POCT CBG MONITORING   No results found.   No diagnosis found.    MDM  Hyperglycemia Abscess  Wound re-check         Verl Dicker, [place] 01/24/11 1228

## 2011-01-24 NOTE — ED Notes (Signed)
bs 162

## 2011-01-24 NOTE — ED Notes (Signed)
TTC:NG39<EV> Expected date:01/24/11<BR> Expected time: 1:18 PM<BR> Means of arrival:Ambulance<BR> Comments:<BR> [place]

## 2011-01-26 ENCOUNTER — Encounter (HOSPITAL_BASED_OUTPATIENT_CLINIC_OR_DEPARTMENT_OTHER): Payer: Self-pay | Attending: Internal Medicine

## 2011-01-26 DIAGNOSIS — L02219 Cutaneous abscess of trunk, unspecified: Secondary | ICD-10-CM | POA: Insufficient documentation

## 2011-01-26 DIAGNOSIS — L03319 Cellulitis of trunk, unspecified: Secondary | ICD-10-CM | POA: Insufficient documentation

## 2011-01-26 DIAGNOSIS — IMO0001 Reserved for inherently not codable concepts without codable children: Secondary | ICD-10-CM | POA: Insufficient documentation

## 2011-01-26 DIAGNOSIS — S98119A Complete traumatic amputation of unspecified great toe, initial encounter: Secondary | ICD-10-CM | POA: Insufficient documentation

## 2011-01-26 DIAGNOSIS — L98499 Non-pressure chronic ulcer of skin of other sites with unspecified severity: Secondary | ICD-10-CM | POA: Insufficient documentation

## 2011-01-26 NOTE — Progress Notes (Unsigned)
[facility]  NAME:  Bailey [name], Bailey [name]                ACCOUNT NO.:  [id number]  MEDICAL RECORD NO.:  [id number]      DATE OF BIRTH:  11-30-1950  PHYSICIAN:  Orlando Penner. Anastashia Westerfeld, M.D.  VISIT DATE:  01/26/2011                                  OFFICE VISIT   HISTORY:  This is a 60 year old white female with type 2 diabetes, is seen for follow up of an abscess, which was drained in the emergency room 4 days ago and repacked 2 days ago.  The patient has had type 2 diabetes for some time, but apparently because of her husband's loss of insurance, she has not had a primary doctor in some time and had been off all medications.  She apparently developed a painful "boil" in the dorsal region of her back and was seen in the emergency room 4 days ago, where this was felt to be an abscess and was lanced.  The culture results are not available at this time. She was placed on doxycycline 100 mg b.i.d. and the wound was packed with iodoform gauze.  She returned to the ER 2 days later, it was repacked and was referred here for further evaluation and continued care.  PAST MEDICAL HISTORY:  Primarily remarkable for her diabetes, which is currently unattended and for toe amputation of the left hallux done in 2010 as result of complications of diabetes.  Apparently, that has healed them in satisfactory since.  ALLERGIES:  She has no known medicinal allergies.  MEDICATIONS:  Metformin 500 mg b.i.d., which was begun again in the emergency room; doxycycline 100 mg b.i.d., likewise begun in the emergency room, hydrocodone 5/325 as needed for pain.  The patient has no other active problems or complaints at this time. She was given a list of physicians who accepting new patients and promises to seek a new physician for her diabetes care post haste. Incidentally, she says her blood sugars continue run in about 240 to 250 range even on the current dose of metformin.  EXAMINATION:  VITAL  SIGNS:  Blood pressure is 155/86, pulse is 80 and regular, respirations 16, temperature 97.7, random blood glucose 244 mg/dL. GENERAL:  She is a late middle-aged white female, appearing a bit unkempt, in no immediate distress.  Alert and cooperative.  General examination is not undertaken, but she does have on her upper back just to the left of the midline, at approximately the T3 level, an opened abscess cavity measuring 2.0 x 0.7 x 1.1 cm in depth with surrounding erythema, which appears to be abating.  There is bloody serosanguineous drainage from the wound.  IMPRESSION: 1. Drained abscess possible secondarily infected sebaceous cyst at the     left upper back. 2. Diabetes mellitus type 2, poorly controlled.  DISPOSITION:  The wound is irrigated and notices that there is some fibrous tissue around one of the walls near the mouth of the wound, which raises the issue of could this possibly been a cystic area that was secondarily infected.  Incidentally, the patient is unaware of any typical sebaceous odor when the wound was drained.  Following the irrigation and excision of that fibrous tissue, the wound was then repacked with Iodoform gauze.  The patient is to continue her doxycycline 100 mg  p.o. b.i.d.  She will follow up here in 3 days for repacking of the wound.  She will be seen again by me in 1 week.          ______________________________ Orlando Penner. London Pepper, M.D.     RES/MEDQ  D:  01/26/2011  T:  01/26/2011  Job:  [id number]

## 2011-02-25 ENCOUNTER — Encounter (HOSPITAL_BASED_OUTPATIENT_CLINIC_OR_DEPARTMENT_OTHER): Payer: Self-pay | Attending: Internal Medicine

## 2011-02-25 DIAGNOSIS — L02219 Cutaneous abscess of trunk, unspecified: Secondary | ICD-10-CM | POA: Insufficient documentation

## 2011-02-25 DIAGNOSIS — L03319 Cellulitis of trunk, unspecified: Secondary | ICD-10-CM | POA: Insufficient documentation

## 2011-02-26 NOTE — Progress Notes (Signed)
[facility]  NAME:  [facility]                     ACCOUNT NO.:  MEDICAL RECORD NO.:  [id number]      DATE OF BIRTH:  Nov 27, 1950  PHYSICIAN:  Theodoro Kos, DO       VISIT DATE:  02/25/2011                                  OFFICE VISIT   HISTORY OF PRESENT ILLNESS:  The patient is a 61 year old white female with an abscess on her back area.  She has been using her Aquacel Ag with Mepetil and according to the notes, this is markedly improved since her last visit.  The redness and swelling have improved around the area. She is a diabetic.  There has been no change in her medications.  Her blood sugars have been a little elevated due to the holidays but she states that she is going to work on keeping those regulated better.  No change in social history.  REVIEW OF SYSTEMS:  Negative.  PHYSICAL EXAMINATION:  GENERAL:  She is alert and oriented, cooperative, very pleasant.  She seems to be a good historian. HEENT:  Her pupils are equal.  Extraocular muscles are intact. BACK:  No cervical lymphadenopathy. LUNGS:  Breathing is unlabored. HEART:  Regular. ABDOMEN:  Soft. SKIN:  Her wound as noted in the nurse's note and described above with a little bit of undermining from the 12-3 o'clock position, but overall improving.  At this point, I would like to continue with Aquacel Ag for 1 week and then hope to be able to switch to collagen.  The patient acknowledges understanding and agreement with this plan and we will see her back in 1 week.     Theodoro Kos, DO     CS/MEDQ  D:  02/25/2011  T:  02/26/2011  Job:  [id number]

## 2011-03-02 ENCOUNTER — Encounter (HOSPITAL_BASED_OUTPATIENT_CLINIC_OR_DEPARTMENT_OTHER): Payer: Managed Care, Other (non HMO)

## 2014-02-10 ENCOUNTER — Ambulatory Visit (HOSPITAL_BASED_OUTPATIENT_CLINIC_OR_DEPARTMENT_OTHER)
Admission: RE | Admit: 2014-02-10 | Discharge: 2014-02-10 | Disposition: A | Discharge status: Home / Self Care | Payer: Medicare Other | Source: Ambulatory Visit | Attending: Family Medicine | Admitting: Family Medicine

## 2014-02-10 ENCOUNTER — Ambulatory Visit (INDEPENDENT_AMBULATORY_CARE_PROVIDER_SITE_OTHER): Payer: Medicare Other | Admitting: Family Medicine

## 2014-02-10 ENCOUNTER — Ambulatory Visit (INDEPENDENT_AMBULATORY_CARE_PROVIDER_SITE_OTHER): Payer: Medicare Other

## 2014-02-10 ENCOUNTER — Inpatient Hospital Stay (HOSPITAL_COMMUNITY)
Admission: EM | Admit: 2014-02-10 | Discharge: 2014-02-14 | DRG: 871 | Disposition: A | Discharge status: Home / Self Care | Payer: Medicare Other | Attending: Internal Medicine | Admitting: Internal Medicine

## 2014-02-10 ENCOUNTER — Encounter (HOSPITAL_COMMUNITY): Payer: Self-pay | Admitting: *Deleted

## 2014-02-10 VITALS — BP 96/52 | HR 90 | Temp 98.1°F | Resp 24 | Ht 63.25 in | Wt 149.4 lb

## 2014-02-10 DIAGNOSIS — A4901 Methicillin susceptible Staphylococcus aureus infection, unspecified site: Secondary | ICD-10-CM | POA: Diagnosis present

## 2014-02-10 DIAGNOSIS — K5730 Diverticulosis of large intestine without perforation or abscess without bleeding: Secondary | ICD-10-CM | POA: Diagnosis present

## 2014-02-10 DIAGNOSIS — E119 Type 2 diabetes mellitus without complications: Secondary | ICD-10-CM

## 2014-02-10 DIAGNOSIS — M791 Myalgia: Secondary | ICD-10-CM

## 2014-02-10 DIAGNOSIS — B9620 Unspecified Escherichia coli [E. coli] as the cause of diseases classified elsewhere: Secondary | ICD-10-CM | POA: Diagnosis present

## 2014-02-10 DIAGNOSIS — E118 Type 2 diabetes mellitus with unspecified complications: Secondary | ICD-10-CM

## 2014-02-10 DIAGNOSIS — R229 Localized swelling, mass and lump, unspecified: Secondary | ICD-10-CM | POA: Insufficient documentation

## 2014-02-10 DIAGNOSIS — Z89412 Acquired absence of left great toe: Secondary | ICD-10-CM | POA: Diagnosis not present

## 2014-02-10 DIAGNOSIS — R8281 Pyuria: Secondary | ICD-10-CM

## 2014-02-10 DIAGNOSIS — E1310 Other specified diabetes mellitus with ketoacidosis without coma: Secondary | ICD-10-CM | POA: Diagnosis present

## 2014-02-10 DIAGNOSIS — N390 Urinary tract infection, site not specified: Secondary | ICD-10-CM | POA: Diagnosis present

## 2014-02-10 DIAGNOSIS — A419 Sepsis, unspecified organism: Principal | ICD-10-CM | POA: Diagnosis present

## 2014-02-10 DIAGNOSIS — M25552 Pain in left hip: Secondary | ICD-10-CM

## 2014-02-10 DIAGNOSIS — L0231 Cutaneous abscess of buttock: Secondary | ICD-10-CM | POA: Diagnosis present

## 2014-02-10 DIAGNOSIS — Z794 Long term (current) use of insulin: Secondary | ICD-10-CM | POA: Diagnosis not present

## 2014-02-10 DIAGNOSIS — M7918 Myalgia, other site: Secondary | ICD-10-CM

## 2014-02-10 DIAGNOSIS — E1165 Type 2 diabetes mellitus with hyperglycemia: Secondary | ICD-10-CM

## 2014-02-10 DIAGNOSIS — E1110 Type 2 diabetes mellitus with ketoacidosis without coma: Secondary | ICD-10-CM | POA: Diagnosis present

## 2014-02-10 DIAGNOSIS — Z833 Family history of diabetes mellitus: Secondary | ICD-10-CM

## 2014-02-10 DIAGNOSIS — Z9119 Patient's noncompliance with other medical treatment and regimen: Secondary | ICD-10-CM | POA: Diagnosis present

## 2014-02-10 LAB — POCT URINALYSIS DIPSTICK
Glucose, UA: >=1000
Ketones, UA: 40
Nitrite, UA: NEGATIVE
Protein, UA: NEGATIVE
Spec Grav, UA: <=1.005
Urobilinogen, UA: 1
pH, UA: 5.5

## 2014-02-10 LAB — BASIC METABOLIC PANEL
ANION GAP: 21 — AB (ref 5–15)
BUN: 18 mg/dL (ref 6–23)
CALCIUM: 9.6 mg/dL (ref 8.4–10.5)
CHLORIDE: 88 meq/L — AB (ref 96–112)
CO2: 21 meq/L (ref 19–32)
Creatinine, Ser: 0.53 mg/dL (ref 0.50–1.10)
GFR calc Af Amer: >90 mL/min (ref >90)
GFR calc non Af Amer: >90 mL/min (ref >90)
GLUCOSE: 563 mg/dL — AB (ref 70–99)
Potassium: 3.6 mEq/L — ABNORMAL LOW (ref 3.7–5.3)
SODIUM: 130 meq/L — AB (ref 137–147)

## 2014-02-10 LAB — CBG MONITORING, ED
GLUCOSE-CAPILLARY: 552 mg/dL — AB (ref 70–99)
Glucose-Capillary: 496 mg/dL — ABNORMAL HIGH (ref 70–99)

## 2014-02-10 LAB — POCT CBC
Granulocyte percent: 81.9 %G — AB (ref 37–80)
HCT, POC: 38.7 % (ref 37.7–47.9)
Hemoglobin: 12.9 g/dL (ref 12.2–16.2)
Lymph, poc: 2.3 (ref 0.6–3.4)
MCH, POC: 29.8 pg (ref 27–31.2)
MCHC: 33.3 g/dL (ref 31.8–35.4)
MCV: 89.6 fL (ref 80–97)
MID (cbc): 0.6 (ref 0–0.9)
MPV: 6.2 fL (ref 0–99.8)
POC Granulocyte: 13.5 — AB (ref 2–6.9)
POC LYMPH PERCENT: 14.2 %L (ref 10–50)
POC MID %: 3.9 %M (ref 0–12)
Platelet Count, POC: 327 10*3/uL (ref 142–424)
RBC: 4.32 M/uL (ref 4.04–5.48)
RDW, POC: 13 %
WBC: 16.5 10*3/uL — AB (ref 4.6–10.2)

## 2014-02-10 LAB — CBC WITH DIFFERENTIAL/PLATELET
Basophils Absolute: 0 10*3/uL (ref 0.0–0.1)
Basophils Relative: 0 % (ref 0–1)
Eosinophils Absolute: 0 10*3/uL (ref 0.0–0.7)
Eosinophils Relative: 0 % (ref 0–5)
HCT: 39 % (ref 36.0–46.0)
Hemoglobin: 13.2 g/dL (ref 12.0–15.0)
LYMPHS ABS: 1 10*3/uL (ref 0.7–4.0)
LYMPHS PCT: 9 % — AB (ref 12–46)
MCH: 29.8 pg (ref 26.0–34.0)
MCHC: 33.8 g/dL (ref 30.0–36.0)
MCV: 88 fL (ref 78.0–100.0)
Monocytes Absolute: 0.7 10*3/uL (ref 0.1–1.0)
Monocytes Relative: 6 % (ref 3–12)
NEUTROS ABS: 10.2 10*3/uL — AB (ref 1.7–7.7)
Neutrophils Relative %: 85 % — ABNORMAL HIGH (ref 43–77)
PLATELETS: 288 10*3/uL (ref 150–400)
RBC: 4.43 MIL/uL (ref 3.87–5.11)
RDW: 12.8 % (ref 11.5–15.5)
WBC: 11.9 10*3/uL — AB (ref 4.0–10.5)

## 2014-02-10 LAB — CREATININE, SERUM: Creat: 0.74 mg/dL (ref 0.50–1.10)

## 2014-02-10 LAB — URINE MICROSCOPIC-ADD ON

## 2014-02-10 LAB — GLUCOSE, CAPILLARY: Glucose-Capillary: 277 mg/dL — ABNORMAL HIGH (ref 70–99)

## 2014-02-10 LAB — POCT UA - MICROSCOPIC ONLY
Casts, Ur, LPF, POC: NEGATIVE
Crystals, Ur, HPF, POC: NEGATIVE
Mucus, UA: NEGATIVE
Yeast, UA: POSITIVE

## 2014-02-10 LAB — URINALYSIS, ROUTINE W REFLEX MICROSCOPIC
BILIRUBIN URINE: NEGATIVE
Glucose, UA: >1000 mg/dL — AB
Ketones, ur: 40 mg/dL — AB
LEUKOCYTES UA: NEGATIVE
Nitrite: NEGATIVE
PH: 5 (ref 5.0–8.0)
Protein, ur: 30 mg/dL — AB
SPECIFIC GRAVITY, URINE: 1.042 — AB (ref 1.005–1.030)
UROBILINOGEN UA: 1 mg/dL (ref 0.0–1.0)

## 2014-02-10 LAB — POCT SEDIMENTATION RATE: POCT SED RATE: 115 mm/hr — AB (ref 0–22)

## 2014-02-10 LAB — BUN+CREAT: BUN/Creatinine Ratio: 27 Ratio

## 2014-02-10 LAB — BUN: BUN: 20 mg/dL (ref 6–23)

## 2014-02-10 MED ORDER — VANCOMYCIN HCL IN DEXTROSE 750-5 MG/150ML-% IV SOLN
750.0000 mg | Freq: Two times a day (BID) | INTRAVENOUS | Status: DC
  Administered 2014-02-10 – 2014-02-11 (×2): 750 mg via INTRAVENOUS
  Filled 2014-02-10 (×2): qty 150

## 2014-02-10 MED ORDER — ONDANSETRON HCL 4 MG/2ML IJ SOLN
4.0000 mg | Freq: Once | INTRAMUSCULAR | Status: AC
  Administered 2014-02-10: 4 mg via INTRAVENOUS
  Filled 2014-02-10: qty 2

## 2014-02-10 MED ORDER — HYDROCODONE-ACETAMINOPHEN 5-325 MG PO TABS
1.0000 | ORAL_TABLET | Freq: Four times a day (QID) | ORAL | Status: DC | PRN
  Administered 2014-02-12 – 2014-02-14 (×3): 1 via ORAL
  Filled 2014-02-10 (×3): qty 1

## 2014-02-10 MED ORDER — SODIUM CHLORIDE 0.9 % IV SOLN
INTRAVENOUS | Status: DC
  Administered 2014-02-10: 23:00:00 via INTRAVENOUS

## 2014-02-10 MED ORDER — DEXTROSE-NACL 5-0.45 % IV SOLN
INTRAVENOUS | Status: DC
  Administered 2014-02-11: 01:00:00 via INTRAVENOUS

## 2014-02-10 MED ORDER — PIPERACILLIN-TAZOBACTAM 3.375 G IVPB
3.3750 g | Freq: Three times a day (TID) | INTRAVENOUS | Status: DC
  Administered 2014-02-11 – 2014-02-13 (×8): 3.375 g via INTRAVENOUS
  Filled 2014-02-10 (×9): qty 50

## 2014-02-10 MED ORDER — SODIUM CHLORIDE 0.9 % IV BOLUS (SEPSIS)
1000.0000 mL | Freq: Once | INTRAVENOUS | Status: AC
  Administered 2014-02-10: 1000 mL via INTRAVENOUS

## 2014-02-10 MED ORDER — POTASSIUM CHLORIDE 10 MEQ/100ML IV SOLN
10.0000 meq | Freq: Once | INTRAVENOUS | Status: AC
  Administered 2014-02-10: 10 meq via INTRAVENOUS
  Filled 2014-02-10: qty 100

## 2014-02-10 MED ORDER — ACETAMINOPHEN 500 MG PO TABS: 500.0000 mg | ORAL_TABLET | Freq: Four times a day (QID) | ORAL | Status: DC | PRN

## 2014-02-10 MED ORDER — MORPHINE SULFATE 2 MG/ML IJ SOLN
2.0000 mg | INTRAMUSCULAR | Status: DC | PRN
  Administered 2014-02-11 – 2014-02-12 (×2): 2 mg via INTRAVENOUS
  Filled 2014-02-10 (×2): qty 1

## 2014-02-10 MED ORDER — HYDROMORPHONE HCL 1 MG/ML IJ SOLN
1.0000 mg | Freq: Once | INTRAMUSCULAR | Status: AC
  Administered 2014-02-10: 1 mg via INTRAVENOUS
  Filled 2014-02-10: qty 1

## 2014-02-10 MED ORDER — DEXTROSE-NACL 5-0.45 % IV SOLN: INTRAVENOUS | Status: DC

## 2014-02-10 MED ORDER — HEPARIN SODIUM (PORCINE) 5000 UNIT/ML IJ SOLN
5000.0000 [IU] | Freq: Three times a day (TID) | INTRAMUSCULAR | Status: DC
  Administered 2014-02-10: 5000 [IU] via SUBCUTANEOUS
  Filled 2014-02-10 (×2): qty 1

## 2014-02-10 MED ORDER — POTASSIUM CHLORIDE CRYS ER 20 MEQ PO TBCR
40.0000 meq | EXTENDED_RELEASE_TABLET | Freq: Once | ORAL | Status: AC
  Administered 2014-02-10: 40 meq via ORAL
  Filled 2014-02-10: qty 2

## 2014-02-10 MED ORDER — SODIUM CHLORIDE 0.9 % IV SOLN
INTRAVENOUS | Status: DC
  Administered 2014-02-10: 4.4 [IU]/h via INTRAVENOUS
  Filled 2014-02-10: qty 2.5

## 2014-02-10 MED ORDER — ONDANSETRON HCL 4 MG/2ML IJ SOLN: 4.0000 mg | Freq: Three times a day (TID) | INTRAMUSCULAR | Status: AC | PRN

## 2014-02-10 MED ORDER — SODIUM CHLORIDE 0.9 % IV SOLN: 1000.0000 mL | INTRAVENOUS | Status: DC

## 2014-02-10 MED ORDER — HYDROCODONE-ACETAMINOPHEN 5-325 MG PO TABS: 1.0000 | ORAL_TABLET | Freq: Four times a day (QID) | ORAL | Status: DC | PRN

## 2014-02-10 MED ORDER — SODIUM CHLORIDE 0.9 % IV SOLN
INTRAVENOUS | Status: DC
  Filled 2014-02-10: qty 2.5

## 2014-02-10 MED ORDER — SODIUM CHLORIDE 0.9 % IV BOLUS (SEPSIS): 1000.0000 mL | Freq: Once | INTRAVENOUS | Status: DC

## 2014-02-10 MED ORDER — IOHEXOL 300 MG/ML  SOLN
100.0000 mL | Freq: Once | INTRAMUSCULAR | Status: AC | PRN
  Administered 2014-02-10: 100 mL via INTRAVENOUS

## 2014-02-10 MED ORDER — CEFTRIAXONE SODIUM 1 G IJ SOLR
1.0000 g | Freq: Once | INTRAMUSCULAR | Status: AC
  Administered 2014-02-10: 1 g via INTRAMUSCULAR

## 2014-02-10 MED ORDER — SODIUM CHLORIDE 0.9 % IV SOLN
1000.0000 mL | Freq: Once | INTRAVENOUS | Status: AC
  Administered 2014-02-10: 1000 mL via INTRAVENOUS

## 2014-02-10 NOTE — ED Notes (Signed)
Pt reports L buttock abscess since Monday.  Had a CT done today and was seen at the [facility]-advised to come to the ED.

## 2014-02-10 NOTE — Progress Notes (Addendum)
This 63 year old retired woman who comes in with left buttock swelling and pain. She is unable to sit or press on the ischium.  Patient denies any acute fall or trauma, but does have a history of overuse with lifting one week ago. She's never had problems with her hip. Has no difficulty bending the knee or during the foot in her outlook. She has no numbness in her left leg either.  Patient also notes some urinary frequency over the last week.  Objective: Patient's in no acute distress, is appropriate and has good eye contact Examination of the left buttock reveals a fullness over the ischium which is quite tender. There is no erythema or warmth over this area. She has good range of motion of her hip.  There is no rash.  Rectal exam is normal with no pain in the anus or in the rectal ampulla. There is no swelling or bony tenderness on digital rectal exam.  [facility] reading (PRIMARY) by  Dr. Joseph Art:  Left hip: some soft tissue swelling, bones appear normal  Results for orders placed or performed in visit on 02/10/14  POCT CBC  Result Value Ref Range   WBC 16.5 (A) 4.6 - 10.2 K/uL   Lymph, poc 2.3 0.6 - 3.4   POC LYMPH PERCENT 14.2 10 - 50 %L   MID (cbc) 0.6 0 - 0.9   POC MID % 3.9 0 - 12 %M   POC Granulocyte 13.5 (A) 2 - 6.9   Granulocyte percent 81.9 (A) 37 - 80 %G   RBC 4.32 4.04 - 5.48 M/uL   Hemoglobin 12.9 12.2 - 16.2 g/dL   HCT, POC 38.7 37.7 - 47.9 %   MCV 89.6 80 - 97 fL   MCH, POC 29.8 27 - 31.2 pg   MCHC 33.3 31.8 - 35.4 g/dL   RDW, POC 13.0 %   Platelet Count, POC 327 142 - 424 K/uL   MPV 6.2 0 - 99.8 fL  POCT urinalysis dipstick  Result Value Ref Range   Color, UA yellow    Clarity, UA cloudy    Glucose, UA >=1000    Bilirubin, UA small    Ketones, UA 40    Spec Grav, UA <=1.005    Blood, UA trace    pH, UA 5.5    Protein, UA neg    Urobilinogen, UA 1.0    Nitrite, UA neg    Leukocytes, UA Trace   POCT UA - Microscopic Only  Result Value Ref Range   WBC, Ur,  HPF, POC 20-25    RBC, urine, microscopic 1-3    Bacteria, U Microscopic 3+    Mucus, UA neg    Epithelial cells, urine per micros 2-4    Crystals, Ur, HPF, POC neg    Casts, Ur, LPF, POC neg    Yeast, UA positive    . Assessment: This is an unusual presentation. She does have a urinary tract infection but the swelling and tenderness over the ischium is difficult to explain. Unconcerned she may have a deep abscess in her left gluteal region.  Plan:  Rocephin 1 gm IM norco CT of left gluteal area Urine culture This chart was scribed in my presence and reviewed by me personally.    ICD-9-CM ICD-10-CM   1. Hip pain, acute, left 719.45 M25.552 DG Hip Complete Left     POCT CBC     POCT SEDIMENTATION RATE     POCT urinalysis dipstick     POCT  UA - Microscopic Only     HYDROcodone-acetaminophen (NORCO) 5-325 MG per tablet     BUN+Creat     CANCELED: BUN+Creat  2. Pain in left buttock 729.1 M79.1 CT Pelvis W Contrast     HYDROcodone-acetaminophen (NORCO) 5-325 MG per tablet     cefTRIAXone (ROCEPHIN) injection 1 g     BUN+Creat     CANCELED: BUN+Creat  3. Pyuria 791.9 N39.0 Urine culture     cefTRIAXone (ROCEPHIN) injection 1 g     BUN+Creat     CANCELED: BUN+Creat   Note: This is sam green's sister  Signed, Robyn Haber, MD

## 2014-02-10 NOTE — Patient Instructions (Signed)
Please go to [facility] you will check in at the Emergency Department for a 3pm appointment.  Tell them you are there for an outpatient scan.  [address] [place],  [address] Phone: [phone]

## 2014-02-10 NOTE — ED Notes (Signed)
Critical value-CBG 563 relayed to Nurse [place] as well as PA JGEIPLE

## 2014-02-10 NOTE — ED Provider Notes (Signed)
CSN: [id number]     Arrival date & time 02/10/14  1742 History   First MD Initiated Contact with Patient 02/10/14 1856     Chief Complaint  Patient presents with  . Abscess   (Consider location/radiation/quality/duration/timing/severity/associated sxs/prior Treatment) HPI Comments: Patient with history of diabetes, currently noncompliant, presents with complaint of left buttock pain for approximately one week, much worse over the past 5 days. Patient saw an urgent care physician today and had a CT ordered which demonstrated a deep large gluteal abscess on the left. Patient denies fever, nausea, vomiting. No rectal pain or difficulty with bowel movements. The onset of this condition was gradual. The course is worsening. Aggravating factors: palpation and lying on the area. Alleviating factors: none.    Patient is a 63 y.o. female presenting with abscess. The history is provided by the patient and medical records.  Abscess Associated symptoms: no fever, no headaches, no nausea and no vomiting     Past Medical History  Diagnosis Date  . Diabetes mellitus    Past Surgical History  Procedure Laterality Date  . Toe amputation      left foot great toe   Family History  Problem Relation Age of Onset  . Diabetes Mother   . Asthma Sister    History  Substance Use Topics  . Smoking status: Never Smoker   . Smokeless tobacco: Never Used  . Alcohol Use: No   OB History    No data available     Review of Systems  Constitutional: Negative for fever.  HENT: Negative for rhinorrhea and sore throat.   Eyes: Negative for redness.  Respiratory: Negative for cough.   Cardiovascular: Negative for chest pain.  Gastrointestinal: Negative for nausea, vomiting, abdominal pain and diarrhea.  Genitourinary: Negative for dysuria.  Musculoskeletal: Positive for myalgias. Negative for arthralgias.  Skin: Negative for color change and rash.       Positive for abscess.  Neurological: Negative for  headaches.  Hematological: Negative for adenopathy.    Allergies  Review of patient's allergies indicates no known allergies.  Home Medications   Prior to Admission medications   Medication Sig Start Date End Date Taking? Authorizing Provider  acetaminophen (TYLENOL) 500 MG tablet Take 500 mg by mouth every 6 (six) hours as needed for moderate pain (pain).   Yes Historical Provider, MD  Menthol, Topical Analgesic, (ICY HOT EX) Apply 1 application topically daily as needed (pain).   Yes Historical Provider, MD  HYDROcodone-acetaminophen (NORCO) 5-325 MG per tablet Take 1 tablet by mouth every 6 (six) hours as needed for moderate pain. 02/10/14   Robyn Haber, MD  insulin aspart protamine-insulin aspart (NOVOLOG 70/30) (70-30) 100 UNIT/ML injection Inject 15 Units into the skin daily.      Historical Provider, MD  metFORMIN (GLUCOPHAGE) 500 MG tablet Take 500 mg by mouth 2 (two) times daily with a meal.      Historical Provider, MD   BP 147/72 mmHg  Pulse 102  Temp(Src) 98.5 F (36.9 C) (Oral)  Resp 21  SpO2 98%   Physical Exam  Constitutional: She appears well-developed and well-nourished.  HENT:  Head: Normocephalic and atraumatic.  Eyes: Conjunctivae are normal. Right eye exhibits no discharge. Left eye exhibits no discharge.  Neck: Normal range of motion. Neck supple.  Cardiovascular: Regular rhythm and normal heart sounds.  Tachycardia present.   Mild tachycardia  Pulmonary/Chest: Effort normal and breath sounds normal.  Abdominal: Soft. There is no tenderness.  Genitourinary:  Candidal infection  noted in gluteal cleft.   Neurological: She is alert.  Skin: Skin is warm and dry.  Firm large area of induration noted L buttock, warm and tender, no significant overlying redness.   Psychiatric: She has a normal mood and affect.  Nursing note and vitals reviewed.   ED Course  Procedures (including critical care time) Labs Review Labs Reviewed  CBC WITH DIFFERENTIAL -  Abnormal; Notable for the following:    WBC 11.9 (*)    Neutrophils Relative % 85 (*)    Neutro Abs 10.2 (*)    Lymphocytes Relative 9 (*)    All other components within normal limits  BASIC METABOLIC PANEL - Abnormal; Notable for the following:    Sodium 130 (*)    Potassium 3.6 (*)    Chloride 88 (*)    Glucose, Bld 563 (*)    Anion gap 21 (*)    All other components within normal limits  CBG MONITORING, ED - Abnormal; Notable for the following:    Glucose-Capillary 552 (*)    All other components within normal limits  URINALYSIS, ROUTINE W REFLEX MICROSCOPIC    Imaging Review Ct Pelvis W Contrast  02/10/2014   CLINICAL DATA:  Left buttock swelling. Question abscess. No trauma.  EXAM: CT PELVIS WITH CONTRAST  TECHNIQUE: Multidetector CT imaging of the pelvis was performed using the standard protocol following the bolus administration of intravenous contrast.  CONTRAST:  OMNIPAQUE IOHEXOL 300 MG/ML  SOLN  COMPARISON:  CT 07/11/2002  FINDINGS: Along the inferior medial border of the left gluteus maximus muscle there ovoid mass measuring 9.4 x 4.7x 5.5 cm. The mass has similar density to adjacent muscle. There is edema within the subcutaneous fat superficial to this mass lesion suggesting infection. The mass extends from the coccyx laterally towards the femur. Mass lesion is seen on image 39, series 2.  There is no evidence perianal fistula. There is no evidence of abscess or infection within the peritoneal retroperitoneal space with the pelvis.  Adjacent bone to this presumed infection is normal without cortical erosion.  There are several diverticula of the sigmoid colon without acute inflammation. The uterus and ovaries are normal. The bladder is intact. Abdominal aorta is partially image appears normal.  IMPRESSION: 1. Large presumed abscess along the medial border of the left gluteus maximus muscle. 2. No evidence of intraperitoneal retroperitoneal infection. These results will be  called to the ordering clinician or representative by the Radiologist Assistant, and communication documented in the PACS or zVision Dashboard.   Electronically Signed   By: Genevive Bi M.D.   On: 02/10/2014 16:13     EKG Interpretation None       7:19 PM Patient seen and examined. Work-up initiated. Medications ordered.   Vital signs reviewed and are as follows: BP 147/72 mmHg  Pulse 102  Temp(Src) 98.5 F (36.9 C) (Oral)  Resp 21  SpO2 98%  9:03 PM Pt discussed with Dr. Effie Shy. DKA suspected, given ketones in urine on previous UA and AG > 20. Started insulin drip. Pt has received 2L NS previously. Also will give K+ given low potassium.   I spoke with Dr. Abbey Chatters who will see to manage abscess.   Spoke with Dr. Clyde Lundborg who will admit patient. Stepdown bed requested.   CRITICAL CARE Performed by: Carolee Rota Total critical care time: 30 Critical care time was exclusive of separately billable procedures and treating other patients. Critical care was necessary to treat or prevent imminent or life-threatening  deterioration. Critical care was time spent personally by me on the following activities: development of treatment plan with patient and/or surrogate as well as nursing, discussions with consultants, evaluation of patient's response to treatment, examination of patient, obtaining history from patient or surrogate, ordering and performing treatments and interventions, ordering and review of laboratory studies, ordering and review of radiographic studies, pulse oximetry and re-evaluation of patient's condition.   MDM   Final diagnoses:  Diabetic ketoacidosis without coma associated with type 2 diabetes mellitus  Gluteal abscess   Admit.     Carlisle Cater, PA-C 02/10/14 2106  Richarda Blade, MD 02/10/14 [phone]

## 2014-02-10 NOTE — Consult Note (Signed)
Reason for Consult:  Left buttock abscess Referring Physician: Carlisle Cater, PA  Bailey [name] is an 63 y.o. female.  HPI: Bailey [name] went to urgent care today because of left buttock swelling and pain. Approximately a 1 week ago Bailey [name] noted some left buttock soreness.  Approximately 4 days ago Bailey [name] noted the swelling was very uncomfortable. Bailey [name] went to urgent care and was noted to have swelling and warmth in the left buttock region and a leukocytosis. Bailey [name] subsequently was sent for a CT scan which demonstrated a 9.4 cm left buttock abscess. Bailey [name] denies any trauma to the area. Of note is that Bailey [name] has diabetes mellitus and is currently in DKA. I was asked to see her because of the abscess. Bailey [name] denies any fever or chills.  Past Medical History  Diagnosis Date  . Diabetes mellitus     Past Surgical History  Procedure Laterality Date  . Toe amputation      left foot great toe    Family History  Problem Relation Age of Onset  . Diabetes Mother   . Asthma Sister     Social History:  reports that Bailey [name] has never smoked. Bailey [name] has never used smokeless tobacco. Bailey [name] reports that Bailey [name] does not drink alcohol or use illicit drugs.  Allergies: No Known Allergies  Prior to Admission medications   Medication Sig Start Date End Date Taking? Authorizing Provider  acetaminophen (TYLENOL) 500 MG tablet Take 500 mg by mouth every 6 (six) hours as needed for moderate pain (pain).   Yes Historical Provider, MD  Menthol, Topical Analgesic, (ICY HOT EX) Apply 1 application topically daily as needed (pain).   Yes Historical Provider, MD  HYDROcodone-acetaminophen (NORCO) 5-325 MG per tablet Take 1 tablet by mouth every 6 (six) hours as needed for moderate pain. 02/10/14   Robyn Haber, MD  insulin aspart protamine-insulin aspart (NOVOLOG 70/30) (70-30) 100 UNIT/ML injection Inject 15 Units into the skin daily.      Historical Provider, MD  metFORMIN (GLUCOPHAGE) 500 MG tablet Take 500 mg by mouth 2 (two) times daily with  a meal.      Historical Provider, MD    Results for orders placed or performed during the hospital encounter of 02/10/14 (from the past 48 hour(s))  CBC with Differential     Status: Abnormal   Collection Time: 02/10/14  7:19 PM  Result Value Ref Range   WBC 11.9 (H) 4.0 - 10.5 K/uL   RBC 4.43 3.87 - 5.11 MIL/uL   Hemoglobin 13.2 12.0 - 15.0 g/dL   HCT 39.0 36.0 - 46.0 %   MCV 88.0 78.0 - 100.0 fL   MCH 29.8 26.0 - 34.0 pg   MCHC 33.8 30.0 - 36.0 g/dL   RDW 12.8 11.5 - 15.5 %   Platelets 288 150 - 400 K/uL   Neutrophils Relative % 85 (H) 43 - 77 %   Neutro Abs 10.2 (H) 1.7 - 7.7 K/uL   Lymphocytes Relative 9 (L) 12 - 46 %   Lymphs Abs 1.0 0.7 - 4.0 K/uL   Monocytes Relative 6 3 - 12 %   Monocytes Absolute 0.7 0.1 - 1.0 K/uL   Eosinophils Relative 0 0 - 5 %   Eosinophils Absolute 0.0 0.0 - 0.7 K/uL   Basophils Relative 0 0 - 1 %   Basophils Absolute 0.0 0.0 - 0.1 K/uL  Basic metabolic panel     Status: Abnormal   Collection Time: 02/10/14  7:19 PM  Result Value Ref Range  Sodium 130 (L) 137 - 147 mEq/L   Potassium 3.6 (L) 3.7 - 5.3 mEq/L   Chloride 88 (L) 96 - 112 mEq/L   CO2 21 19 - 32 mEq/L   Glucose, Bld 563 (HH) 70 - 99 mg/dL    Comment: CRITICAL RESULT CALLED TO, READ BACK BY AND VERIFIED WITH: SPOKE WITH DUDLEY,F RN [id number] [place],N    BUN 18 6 - 23 mg/dL   Creatinine, Ser 0.53 0.50 - 1.10 mg/dL   Calcium 9.6 8.4 - 10.5 mg/dL   GFR calc non Af Amer >90 >90 mL/min   GFR calc Af Amer >90 >90 mL/min    Comment: (NOTE) The eGFR has been calculated using the CKD EPI equation. This calculation has not been validated in all clinical situations. eGFR's persistently <90 mL/min signify possible Chronic Kidney Disease.    Anion gap 21 (H) 5 - 15    Comment: REPEATED TO VERIFY  CBG monitoring, ED     Status: Abnormal   Collection Time: 02/10/14  7:38 PM  Result Value Ref Range   Glucose-Capillary 552 (HH) 70 - 99 mg/dL    Ct Pelvis W Contrast  02/10/2014    CLINICAL DATA:  Left buttock swelling. Question abscess. No trauma.  EXAM: CT PELVIS WITH CONTRAST  TECHNIQUE: Multidetector CT imaging of the pelvis was performed using the standard protocol following the bolus administration of intravenous contrast.  CONTRAST:  131m OMNIPAQUE IOHEXOL 300 MG/ML  SOLN  COMPARISON:  CT 07/11/2002  FINDINGS: Along the inferior medial border of the left gluteus maximus muscle there ovoid mass measuring 9.4 x 4.7x 5.5 cm. The mass has similar density to adjacent muscle. There is edema within the subcutaneous fat superficial to this mass lesion suggesting infection. The mass extends from the coccyx laterally towards the femur. Mass lesion is seen on image 39, series 2.  There is no evidence perianal fistula. There is no evidence of abscess or infection within the peritoneal retroperitoneal space with the pelvis.  Adjacent bone to this presumed infection is normal without cortical erosion.  There are several diverticula of the sigmoid colon without acute inflammation. The uterus and ovaries are normal. The bladder is intact. Abdominal aorta is partially image appears normal.  IMPRESSION: 1. Large presumed abscess along the medial border of the left gluteus maximus muscle. 2. No evidence of intraperitoneal retroperitoneal infection. These results will be called to the ordering clinician or representative by the Radiologist Assistant, and communication documented in the PACS or zVision Dashboard.   Electronically Signed   By: SSuzy BouchardM.D.   On: 02/10/2014 16:13    Review of Systems  Constitutional: Negative for fever, chills, weight loss and malaise/fatigue.  Cardiovascular: Negative for chest pain.  Gastrointestinal: Negative for abdominal pain, diarrhea and constipation.  Genitourinary: Positive for dysuria. Negative for hematuria.   Blood pressure 144/55, pulse 102, temperature 98.5 F (36.9 C), temperature source Oral, resp. rate 21, SpO2 99 %. Physical Exam   Constitutional: Bailey [name] appears well-developed and well-nourished. No distress.  HENT:  Head: Normocephalic and atraumatic.  GI: Soft. Bailey [name] exhibits no distension and no mass. There is no tenderness.  Genitourinary:  There is some perianal redness present but no swelling or fissure.  Musculoskeletal:  There is a 9-10 cm warm tender left buttock mass present.  Neurological: Bailey [name] is alert.  Psychiatric: Bailey [name] has a normal mood and affect. Her behavior is normal.    Assessment/Plan: 1. Large left buttock mass  2. Diabetic ketoacidosis  Plan:  Bailey [name] is being admitted to the medical service for control of her diabetic ketoacidosis. We'll plan on taking her to the operating room tomorrow for incision and drainage of the left buttock abscess. In the meantime have recommended broad-spectrum antibiotics. I discussed the procedure and risks. The risks include but are not limited to bleeding, recurrent infection, need for other operations, poor wound healing, anesthesia. I did explain to her and her family that if Bailey [name] did not keep her blood sugars under good control at home, the risk of poor wound healing was significantly higher. They seem to understand this and agree with the plan.  Varvara Legault J 02/10/2014, 9:22 PM

## 2014-02-10 NOTE — Progress Notes (Signed)
ANTIBIOTIC CONSULT NOTE - INITIAL  Pharmacy Consult for Vancomycin & Zosyn Indication: Buttock Abscess  No Known Allergies  Patient Measurements: Height = 63.25 inches Weight = 67.8 kg   Vital Signs: Temp: 98.5 F (36.9 C) (12/19 1817) Temp Source: Oral (12/19 1817) BP: 139/63 mmHg (12/19 2130) Pulse Rate: 103 (12/19 2130) Intake/Output from previous day:   Intake/Output from this shift:    Labs:  Recent Labs  02/10/14 1127 02/10/14 1220 02/10/14 1919  WBC 16.5*  --  11.9*  HGB 12.9  --  13.2  PLT  --   --  288  CREATININE  --  0.74 0.53   Estimated Creatinine Clearance: 65.2 mL/min (by C-G formula based on Cr of 0.53). No results for input(s): VANCOTROUGH, VANCOPEAK, VANCORANDOM, GENTTROUGH, GENTPEAK, GENTRANDOM, TOBRATROUGH, TOBRAPEAK, TOBRARND, AMIKACINPEAK, AMIKACINTROU, AMIKACIN in the last 72 hours.   Microbiology: No results found for this or any previous visit (from the past 720 hour(s)).  Medical History: Past Medical History  Diagnosis Date  . Diabetes mellitus     Medications:  Scheduled:  . heparin  5,000 Units Subcutaneous 3 times per day  . sodium chloride  1,000 mL Intravenous Once   Infusions:  . sodium chloride    . dextrose 5 % and 0.45% NaCl    . insulin (NOVOLIN-R) infusion     Assessment:  63 yr female with h/o DM presents with left buttock swelling and pain.  CT shows abscess  Plan for I&D tomorrow  CrCl ~ 65 ml/min  Blood and urine cultures ordered  Goal of Therapy:  Vancomycin trough level 15-20 mcg/ml  Plan:  Vancomycin $RemoveBef'750mg'EaEPifQTeU$  IV q12 Zosyn 3.375gm IV q8h (each dose infused over 4 hrs) Follow cultures & sensitivities  Shambria Camerer, Toribio Harbour, PharmD 02/10/2014,10:43 PM

## 2014-02-10 NOTE — H&P (Addendum)
[facility] History and Physical  Bailey [name]:[id number] DOB: March 24, 1950 DOA: 02/10/2014  Referring physician: ED physician PCP: No PCP Per Patient  Specialists:   Chief Complaint: left buttock pain  HPI: Bailey [name] is a 63 y.o. female with past history of diabetes type 2, noncompliant medications, who presents with left buttock pain.  Patient reports that she started having left buttock pain for approximately one week, which has been progressively getting worse. Patient saw an urgent care physician today and had  CT ordered which demonstrated a deep large gluteal abscess on the left. Patient feeling cold and has chills. She denies headaches, cough, chest pain, SOB, abdominal pain, diarrhea, constipation, hematuria, skin rashes, joint pain or leg swelling.  Work up in the ED demonstrates tachycardia with heart rates while 2, leukocytosis with WBC 11.9, trace amount of leukocytes in urine, positive ketone in urine. Potassium 3.6, elevated anion gap of 21. Patient is admitted to inpatient for further evaluation and treatment.  Review of Systems: As presented in the history of presenting illness, rest negative.  Where does patient live?  At home Can patient participate in ADLs? Yes  Allergy: No Known Allergies  Past Medical History  Diagnosis Date  . Diabetes mellitus     Past Surgical History  Procedure Laterality Date  . Toe amputation      left foot great toe    Social History:  reports that she has never smoked. She has never used smokeless tobacco. She reports that she does not drink alcohol or use illicit drugs.  Family History:  Family History  Problem Relation Age of Onset  . Diabetes Mother   . Asthma Sister      Prior to Admission medications   Medication Sig Start Date End Date Taking? Authorizing Provider  acetaminophen (TYLENOL) 500 MG tablet Take 500 mg by mouth every 6 (six) hours as needed for moderate pain (pain).   Yes Historical Provider,  MD  Menthol, Topical Analgesic, (ICY HOT EX) Apply 1 application topically daily as needed (pain).   Yes Historical Provider, MD  HYDROcodone-acetaminophen (NORCO) 5-325 MG per tablet Take 1 tablet by mouth every 6 (six) hours as needed for moderate pain. 02/10/14   Robyn Haber, MD  insulin aspart protamine-insulin aspart (NOVOLOG 70/30) (70-30) 100 UNIT/ML injection Inject 15 Units into the skin daily.      Historical Provider, MD  metFORMIN (GLUCOPHAGE) 500 MG tablet Take 500 mg by mouth 2 (two) times daily with a meal.      Historical Provider, MD    Physical Exam: Filed Vitals:   02/10/14 1900 02/10/14 2000 02/10/14 2030 02/10/14 2130  BP: 149/62 144/57 144/55 139/63  Pulse: 105 102  103  Temp:      TempSrc:      Resp:      SpO2: 98% 99%  100%   General: Not in acute distress HEENT:       Eyes: PERRL, EOMI, no scleral icterus       ENT: No discharge from the ears and nose, no pharynx injection, no tonsillar enlargement.        Neck: No JVD, no bruit, no mass felt. Cardiac: S1/S2, RRR, tachycardia.  No murmurs, No gallops or rubs Pulm: Good air movement bilaterally. Clear to auscultation bilaterally. No rales, wheezing, rhonchi or rubs. Abd: Soft, nondistended, nontender, no rebound pain, no organomegaly, BS present Ext: No edema bilaterally. 2+DP/PT pulse bilaterally Musculoskeletal: No joint deformities, erythema, or stiffness, ROM full Skin: Firm large  area of induration over L buttock, warm and tender, not red. Genitourinary: There is some perianal redness, no swelling or fissure.  Neuro: Alert and oriented X3, cranial nerves II-XII grossly intact, muscle strength 5/5 in all extremeties, sensation to light touch intact. B Psych: Patient is not psychotic, no suicidal or hemocidal ideation.  Labs on Admission:  Basic Metabolic Panel:  Recent Labs Lab 02/10/14 1220 02/10/14 1919  NA  --  130*  K  --  3.6*  CL  --  88*  CO2  --  21  GLUCOSE  --  563*  BUN 20 18   CREATININE 0.74 0.53  CALCIUM  --  9.6   Liver Function Tests: No results for input(s): AST, ALT, ALKPHOS, BILITOT, PROT, ALBUMIN in the last 168 hours. No results for input(s): LIPASE, AMYLASE in the last 168 hours. No results for input(s): AMMONIA in the last 168 hours. CBC:  Recent Labs Lab 02/10/14 1127 02/10/14 1919  WBC 16.5* 11.9*  NEUTROABS  --  10.2*  HGB 12.9 13.2  HCT 38.7 39.0  MCV 89.6 88.0  PLT  --  288   Cardiac Enzymes: No results for input(s): CKTOTAL, CKMB, CKMBINDEX, TROPONINI in the last 168 hours.  BNP (last 3 results) No results for input(s): PROBNP in the last 8760 hours. CBG:  Recent Labs Lab 02/10/14 1938 02/10/14 2057  GLUCAP 552* 496*    Radiological Exams on Admission: Ct Pelvis W Contrast  02/10/2014   CLINICAL DATA:  Left buttock swelling. Question abscess. No trauma.  EXAM: CT PELVIS WITH CONTRAST  TECHNIQUE: Multidetector CT imaging of the pelvis was performed using the standard protocol following the bolus administration of intravenous contrast.  CONTRAST:  174mL OMNIPAQUE IOHEXOL 300 MG/ML  SOLN  COMPARISON:  CT 07/11/2002  FINDINGS: Along the inferior medial border of the left gluteus maximus muscle there ovoid mass measuring 9.4 x 4.7x 5.5 cm. The mass has similar density to adjacent muscle. There is edema within the subcutaneous fat superficial to this mass lesion suggesting infection. The mass extends from the coccyx laterally towards the femur. Mass lesion is seen on image 39, series 2.  There is no evidence perianal fistula. There is no evidence of abscess or infection within the peritoneal retroperitoneal space with the pelvis.  Adjacent bone to this presumed infection is normal without cortical erosion.  There are several diverticula of the sigmoid colon without acute inflammation. The uterus and ovaries are normal. The bladder is intact. Abdominal aorta is partially image appears normal.  IMPRESSION: 1. Large presumed abscess along the  medial border of the left gluteus maximus muscle. 2. No evidence of intraperitoneal retroperitoneal infection. These results will be called to the ordering clinician or representative by the Radiologist Assistant, and communication documented in the PACS or zVision Dashboard.   Electronically Signed   By: Suzy Bouchard M.D.   On: 02/10/2014 16:13    EKG: will get one  Assessment/Plan Principal Problem:   Abscess of buttock, left Active Problems:   Diabetes mellitus without complication   DKA (diabetic ketoacidoses)   Sepsis  DKA: patient has mild DKA with AG of 21 and bicarbonate of 2, ketone positivity in urine. Mental status is normal. Obviously due to Medication non-complaints and ongoing infection. - Admit to stepdown  - Received 2L of NS bolus in ED, will give another 1L of ns bolus,  - start DKA protocol with BMP q2h - IVF: NS 125 cc/h; will switch to D5-1/4NS when CBG<250 - replete  K as needed - Zofran prn nausea  - NPO   Abscess of left buttock and sepsis: Patient has mild sepsis with heart rate of 102, leukocytosis. General surgeon was consulted, Dr. Elinor Parkinson evaluated the patient, plan to do I&D after DKA is controlled. Patient received 2 L of normal saline bolus in the emergency room. -will give another 1L of ns bolus, and followed by IVF: 125 cc/h of NS -check lactic acid -IV vancomycin plus Zosyn -Blood cultures 2  -PTT/IRN -pain control  DM-II: has not taken insulin and metformin for long time (~2 years per patient). Now with DKA -will consult to Diabetic Educator -resume home regimen at discharge  DVT ppx: SQ Heparin     Code Status: Full code Family Communication:  Yes, patient's  husband     at bed side Disposition Plan: Admit to inpatient   Date of Service 02/10/2014    Ivor Costa [facility] Pager [phone]  If 7PM-7AM, please contact night-coverage [email or web address] Password [email or web address] 02/10/2014, 10:05 PM

## 2014-02-11 ENCOUNTER — Encounter (HOSPITAL_COMMUNITY)
Admission: EM | Disposition: A | Discharge status: Home / Self Care | Payer: Self-pay | Source: Home / Self Care | Attending: Internal Medicine

## 2014-02-11 ENCOUNTER — Encounter (HOSPITAL_COMMUNITY): Payer: Self-pay | Admitting: *Deleted

## 2014-02-11 ENCOUNTER — Inpatient Hospital Stay (HOSPITAL_COMMUNITY): Payer: Medicare Other | Admitting: *Deleted

## 2014-02-11 DIAGNOSIS — E1010 Type 1 diabetes mellitus with ketoacidosis without coma: Secondary | ICD-10-CM

## 2014-02-11 HISTORY — PX: INCISION AND DRAINAGE ABSCESS: SHX5864

## 2014-02-11 LAB — GLUCOSE, CAPILLARY
GLUCOSE-CAPILLARY: 183 mg/dL — AB (ref 70–99)
GLUCOSE-CAPILLARY: 131 mg/dL — AB (ref 70–99)
GLUCOSE-CAPILLARY: 119 mg/dL — AB (ref 70–99)
GLUCOSE-CAPILLARY: 137 mg/dL — AB (ref 70–99)
GLUCOSE-CAPILLARY: 151 mg/dL — AB (ref 70–99)
Glucose-Capillary: 420 mg/dL — ABNORMAL HIGH (ref 70–99)
Glucose-Capillary: 204 mg/dL — ABNORMAL HIGH (ref 70–99)
Glucose-Capillary: 239 mg/dL — ABNORMAL HIGH (ref 70–99)
Glucose-Capillary: 200 mg/dL — ABNORMAL HIGH (ref 70–99)
Glucose-Capillary: 161 mg/dL — ABNORMAL HIGH (ref 70–99)
Glucose-Capillary: 145 mg/dL — ABNORMAL HIGH (ref 70–99)
Glucose-Capillary: 159 mg/dL — ABNORMAL HIGH (ref 70–99)
Glucose-Capillary: 208 mg/dL — ABNORMAL HIGH (ref 70–99)
Glucose-Capillary: 123 mg/dL — ABNORMAL HIGH (ref 70–99)
Glucose-Capillary: 167 mg/dL — ABNORMAL HIGH (ref 70–99)
Glucose-Capillary: 171 mg/dL — ABNORMAL HIGH (ref 70–99)

## 2014-02-11 LAB — BASIC METABOLIC PANEL
ANION GAP: 12 (ref 5–15)
Anion gap: 16 — ABNORMAL HIGH (ref 5–15)
Anion gap: 17 — ABNORMAL HIGH (ref 5–15)
BUN: 13 mg/dL (ref 6–23)
BUN: 13 mg/dL (ref 6–23)
BUN: 12 mg/dL (ref 6–23)
CO2: 19 mEq/L (ref 19–32)
CO2: 19 mEq/L (ref 19–32)
CO2: 20 mEq/L (ref 19–32)
Calcium: 8.4 mg/dL (ref 8.4–10.5)
Calcium: 8.5 mg/dL (ref 8.4–10.5)
Calcium: 8.4 mg/dL (ref 8.4–10.5)
Chloride: 98 mEq/L (ref 96–112)
Chloride: 99 mEq/L (ref 96–112)
Chloride: 102 mEq/L (ref 96–112)
Creatinine, Ser: 0.47 mg/dL — ABNORMAL LOW (ref 0.50–1.10)
Creatinine, Ser: 0.48 mg/dL — ABNORMAL LOW (ref 0.50–1.10)
Creatinine, Ser: 0.54 mg/dL (ref 0.50–1.10)
GFR calc Af Amer: >90 mL/min (ref >90)
GFR calc non Af Amer: >90 mL/min (ref >90)
GFR calc non Af Amer: >90 mL/min (ref >90)
Glucose, Bld: 237 mg/dL — ABNORMAL HIGH (ref 70–99)
Glucose, Bld: 206 mg/dL — ABNORMAL HIGH (ref 70–99)
Glucose, Bld: 126 mg/dL — ABNORMAL HIGH (ref 70–99)
POTASSIUM: 3.6 meq/L — AB (ref 3.7–5.3)
POTASSIUM: 3.9 meq/L (ref 3.7–5.3)
Potassium: 4 mEq/L (ref 3.7–5.3)
SODIUM: 135 meq/L — AB (ref 137–147)
Sodium: 133 mEq/L — ABNORMAL LOW (ref 137–147)
Sodium: 134 mEq/L — ABNORMAL LOW (ref 137–147)

## 2014-02-11 LAB — HEMOGLOBIN A1C
Hgb A1c MFr Bld: 12.9 % — ABNORMAL HIGH (ref <5.7)
Mean Plasma Glucose: 324 mg/dL — ABNORMAL HIGH (ref <117)

## 2014-02-11 LAB — PROTIME-INR
INR: 1.47 (ref 0.00–1.49)
Prothrombin Time: 18 seconds — ABNORMAL HIGH (ref 11.6–15.2)

## 2014-02-11 LAB — TYPE AND SCREEN
ABO/RH(D): A NEG
ANTIBODY SCREEN: NEGATIVE

## 2014-02-11 LAB — ABO/RH: ABO/RH(D): A NEG

## 2014-02-11 LAB — APTT: aPTT: 38 seconds — ABNORMAL HIGH (ref 24–37)

## 2014-02-11 LAB — LACTIC ACID, PLASMA: LACTIC ACID, VENOUS: 2.3 mmol/L — AB (ref 0.5–2.2)

## 2014-02-11 LAB — MRSA PCR SCREENING: MRSA by PCR: NEGATIVE

## 2014-02-11 SURGERY — INCISION AND DRAINAGE, ABSCESS
Anesthesia: General | Site: Buttocks | Laterality: Left

## 2014-02-11 MED ORDER — BUPIVACAINE HCL (PF) 0.5 % IJ SOLN
INTRAMUSCULAR | Status: AC
Start: 2014-02-11 — End: 2014-02-11
  Filled 2014-02-11: qty 30

## 2014-02-11 MED ORDER — MIDAZOLAM HCL 2 MG/2ML IJ SOLN
INTRAMUSCULAR | Status: AC
  Filled 2014-02-11: qty 2

## 2014-02-11 MED ORDER — ONDANSETRON HCL 4 MG/2ML IJ SOLN
INTRAMUSCULAR | Status: DC | PRN
  Administered 2014-02-11: 4 mg via INTRAVENOUS

## 2014-02-11 MED ORDER — LIDOCAINE HCL (CARDIAC) 20 MG/ML IV SOLN
INTRAVENOUS | Status: DC | PRN
  Administered 2014-02-11: 100 mg via INTRAVENOUS

## 2014-02-11 MED ORDER — INSULIN ASPART 100 UNIT/ML ~~LOC~~ SOLN
3.0000 [IU] | Freq: Three times a day (TID) | SUBCUTANEOUS | Status: DC
  Administered 2014-02-12 – 2014-02-13 (×4): 3 [IU] via SUBCUTANEOUS

## 2014-02-11 MED ORDER — MICONAZOLE NITRATE 2 % VA CREA
1.0000 | TOPICAL_CREAM | Freq: Every day | VAGINAL | Status: DC
  Administered 2014-02-11 – 2014-02-13 (×4): 1 via VAGINAL
  Filled 2014-02-11 (×3): qty 45

## 2014-02-11 MED ORDER — MICONAZOLE NITRATE 2 % VA CREA
1.0000 | TOPICAL_CREAM | Freq: Every day | VAGINAL | Status: DC
  Filled 2014-02-11: qty 45

## 2014-02-11 MED ORDER — FENTANYL CITRATE 0.05 MG/ML IJ SOLN
25.0000 ug | INTRAMUSCULAR | Status: DC | PRN
  Administered 2014-02-11 (×2): 50 ug via INTRAVENOUS

## 2014-02-11 MED ORDER — 0.9 % SODIUM CHLORIDE (POUR BTL) OPTIME
TOPICAL | Status: DC | PRN
  Administered 2014-02-11: 1000 mL

## 2014-02-11 MED ORDER — PROPOFOL 10 MG/ML IV BOLUS
INTRAVENOUS | Status: AC
  Filled 2014-02-11: qty 20

## 2014-02-11 MED ORDER — LACTATED RINGERS IV SOLN
INTRAVENOUS | Status: DC | PRN
  Administered 2014-02-11: 12:00:00 via INTRAVENOUS

## 2014-02-11 MED ORDER — HEPARIN SODIUM (PORCINE) 5000 UNIT/ML IJ SOLN
5000.0000 [IU] | Freq: Three times a day (TID) | INTRAMUSCULAR | Status: DC
  Administered 2014-02-12 – 2014-02-14 (×6): 5000 [IU] via SUBCUTANEOUS
  Filled 2014-02-11 (×9): qty 1

## 2014-02-11 MED ORDER — SODIUM CHLORIDE 0.9 % IV SOLN
INTRAVENOUS | Status: DC
  Administered 2014-02-11 (×2): via INTRAVENOUS

## 2014-02-11 MED ORDER — POTASSIUM CHLORIDE 10 MEQ/100ML IV SOLN
10.0000 meq | Freq: Once | INTRAVENOUS | Status: AC
  Administered 2014-02-11: 10 meq via INTRAVENOUS
  Filled 2014-02-11: qty 100

## 2014-02-11 MED ORDER — INSULIN DETEMIR 100 UNIT/ML ~~LOC~~ SOLN
10.0000 [IU] | Freq: Every day | SUBCUTANEOUS | Status: DC
  Administered 2014-02-11: 10 [IU] via SUBCUTANEOUS
  Filled 2014-02-11 (×2): qty 0.1

## 2014-02-11 MED ORDER — INSULIN ASPART 100 UNIT/ML ~~LOC~~ SOLN
0.0000 [IU] | Freq: Three times a day (TID) | SUBCUTANEOUS | Status: DC
  Administered 2014-02-11: 2 [IU] via SUBCUTANEOUS
  Administered 2014-02-11: 3 [IU] via SUBCUTANEOUS
  Administered 2014-02-12: 2 [IU] via SUBCUTANEOUS
  Administered 2014-02-12: 1 [IU] via SUBCUTANEOUS
  Administered 2014-02-13 – 2014-02-14 (×5): 2 [IU] via SUBCUTANEOUS

## 2014-02-11 MED ORDER — FENTANYL CITRATE 0.05 MG/ML IJ SOLN
INTRAMUSCULAR | Status: AC
  Filled 2014-02-11: qty 2

## 2014-02-11 MED ORDER — CETYLPYRIDINIUM CHLORIDE 0.05 % MT LIQD
7.0000 mL | Freq: Two times a day (BID) | OROMUCOSAL | Status: DC
  Administered 2014-02-11 – 2014-02-14 (×7): 7 mL via OROMUCOSAL

## 2014-02-11 MED ORDER — SUCCINYLCHOLINE CHLORIDE 20 MG/ML IJ SOLN
INTRAMUSCULAR | Status: DC | PRN
  Administered 2014-02-11: 100 mg via INTRAVENOUS

## 2014-02-11 MED ORDER — PROPOFOL 10 MG/ML IV BOLUS
INTRAVENOUS | Status: DC | PRN
  Administered 2014-02-11: 150 mg via INTRAVENOUS

## 2014-02-11 MED ORDER — MIDAZOLAM HCL 5 MG/5ML IJ SOLN
INTRAMUSCULAR | Status: DC | PRN
  Administered 2014-02-11: 2 mg via INTRAVENOUS

## 2014-02-11 MED ORDER — FENTANYL CITRATE 0.05 MG/ML IJ SOLN
INTRAMUSCULAR | Status: DC | PRN
  Administered 2014-02-11 (×2): 50 ug via INTRAVENOUS

## 2014-02-11 MED ORDER — LACTATED RINGERS IV SOLN: INTRAVENOUS | Status: DC

## 2014-02-11 MED ORDER — GERHARDT'S BUTT CREAM
TOPICAL_CREAM | Freq: Two times a day (BID) | CUTANEOUS | Status: DC
  Administered 2014-02-11: 1 via TOPICAL
  Administered 2014-02-11: 16:00:00 via TOPICAL
  Administered 2014-02-12: 1 via TOPICAL
  Administered 2014-02-13 – 2014-02-14 (×4): via TOPICAL
  Filled 2014-02-11 (×2): qty 1

## 2014-02-11 MED ORDER — INSULIN DETEMIR 100 UNIT/ML ~~LOC~~ SOLN
10.0000 [IU] | Freq: Once | SUBCUTANEOUS | Status: AC
  Administered 2014-02-11: 10 [IU] via SUBCUTANEOUS
  Filled 2014-02-11: qty 0.1

## 2014-02-11 MED ORDER — VANCOMYCIN HCL IN DEXTROSE 1-5 GM/200ML-% IV SOLN
1000.0000 mg | Freq: Two times a day (BID) | INTRAVENOUS | Status: DC
  Administered 2014-02-11 – 2014-02-13 (×4): 1000 mg via INTRAVENOUS
  Filled 2014-02-11 (×4): qty 200

## 2014-02-11 SURGICAL SUPPLY — 30 items
BLADE SURG 15 STRL LF DISP TIS (BLADE) ×1 IMPLANT
BLADE SURG 15 STRL SS (BLADE) ×2
BNDG GAUZE ELAST 4 BULKY (GAUZE/BANDAGES/DRESSINGS) IMPLANT
COVER SURGICAL LIGHT HANDLE (MISCELLANEOUS) ×3 IMPLANT
DECANTER SPIKE VIAL GLASS SM (MISCELLANEOUS) IMPLANT
DRAIN PENROSE 18X1/2 LTX STRL (DRAIN) ×3 IMPLANT
DRAPE LAPAROSCOPIC ABDOMINAL (DRAPES) IMPLANT
DRAPE LAPAROTOMY TRNSV 102X78 (DRAPE) ×3 IMPLANT
DRSG PAD ABDOMINAL 8X10 ST (GAUZE/BANDAGES/DRESSINGS) IMPLANT
ELECT REM PT RETURN 9FT ADLT (ELECTROSURGICAL) ×3
ELECTRODE REM PT RTRN 9FT ADLT (ELECTROSURGICAL) ×1 IMPLANT
GAUZE SPONGE 4X4 12PLY STRL (GAUZE/BANDAGES/DRESSINGS) IMPLANT
GLOVE BIO SURGEON STRL SZ7.5 (GLOVE) ×3 IMPLANT
GOWN STRL REUS W/TWL LRG LVL3 (GOWN DISPOSABLE) ×6 IMPLANT
KIT BASIN OR (CUSTOM PROCEDURE TRAY) ×3 IMPLANT
NEEDLE HYPO 25X1 1.5 SAFETY (NEEDLE) IMPLANT
NS IRRIG 1000ML POUR BTL (IV SOLUTION) ×3 IMPLANT
PAD ABD 8X10 STRL (GAUZE/BANDAGES/DRESSINGS) ×6 IMPLANT
PENCIL BUTTON HOLSTER BLD 10FT (ELECTRODE) ×3 IMPLANT
SPONGE LAP 18X18 X RAY DECT (DISPOSABLE) ×3 IMPLANT
SUT MNCRL AB 4-0 PS2 18 (SUTURE) IMPLANT
SUT VIC AB 3-0 SH 27 (SUTURE)
SUT VIC AB 3-0 SH 27XBRD (SUTURE) IMPLANT
SWAB COLLECTION DEVICE MRSA (MISCELLANEOUS) ×3 IMPLANT
SYR BULB 3OZ (MISCELLANEOUS) IMPLANT
SYR CONTROL 10ML LL (SYRINGE) IMPLANT
TAPE CLOTH SURG 6X10 WHT LF (GAUZE/BANDAGES/DRESSINGS) ×3 IMPLANT
TOWEL OR 17X26 10 PK STRL BLUE (TOWEL DISPOSABLE) ×3 IMPLANT
TUBE ANAEROBIC SPECIMEN COL (MISCELLANEOUS) ×3 IMPLANT
YANKAUER SUCT BULB TIP NO VENT (SUCTIONS) ×3 IMPLANT

## 2014-02-11 NOTE — Progress Notes (Signed)
Pt for I/D left buttocks today. T.Callahan paged concerning AM dose of heparin. Per NP to hold heparin this am, SCDs ordered and requested from house staff.

## 2014-02-11 NOTE — Progress Notes (Signed)
[facility] PROGRESS NOTE  Bailey [name] XNA:[id number] DOB: 28-Mar-1950 DOA: 02/10/2014 PCP: No PCP Per Patient  Assessment/Plan: Buttock Abscess -Continue vanc/zosyn -For I an D by surgery later today.  DKA -GAP closed and bicarb 20. -Will transition off drip today. -Admits to being non-compliant with insulin and was only using her metformin.  Code Status: Full code Family Communication: husband at  bedside Disposition Plan: to be determined   Consultants:  Surgery   Antibiotics:  Vancomycin 12/19-->  Zosyn 12/19-->  Subjective: C/o pain in back and buttocks  Objective: Filed Vitals:   02/11/14 1320 02/11/14 1325 02/11/14 1330 02/11/14 1353  BP:   107/52 118/48  Pulse: 87 85 85 86  Temp:   99.1 F (37.3 C) 99.3 F (37.4 C)  TempSrc:      Resp: $Remo'26 24 19 24  'oWfVw$ Height:      Weight:      SpO2: 97% 99% 98% 98%    Intake/Output Summary (Last 24 hours) at 02/11/14 1534 Last data filed at 02/11/14 1400  Gross per 24 hour  Intake 2838.92 ml  Output    425 ml  Net 2413.92 ml   Filed Weights   02/10/14 2300 02/11/14 0400  Weight: 71.5 kg (157 lb 10.1 oz) 71.5 kg (157 lb 10.1 oz)    Exam:   General:  AA OX3  Cardiovascular: RRR  Respiratory: CTA B  Abdomen: S/NT/ND/+BS  Extremities: no C/C/E  Neurologic:  Non-focal  Data Reviewed: Basic Metabolic Panel:  Recent Labs Lab 02/10/14 1220 02/10/14 1919 02/11/14 0049 02/11/14 0300 02/11/14 0629  NA  --  130* 133* 135* 134*  K  --  3.6* 4.0 3.6* 3.9  CL  --  88* 98 99 102  CO2  --  $R'21 19 19 20  'ud$ GLUCOSE  --  563* 237* 206* 126*  BUN $Re'20 18 13 13 12  'ThP$ CREATININE 0.74 0.53 0.47* 0.48* 0.54  CALCIUM  --  9.6 8.4 8.5 8.4   Liver Function Tests: No results for input(s): AST, ALT, ALKPHOS, BILITOT, PROT, ALBUMIN in the last 168 hours. No results for input(s): LIPASE, AMYLASE in the last 168 hours. No results for input(s): AMMONIA in the last 168 hours. CBC:  Recent Labs Lab  02/10/14 1127 02/10/14 1919  WBC 16.5* 11.9*  NEUTROABS  --  10.2*  HGB 12.9 13.2  HCT 38.7 39.0  MCV 89.6 88.0  PLT  --  288   Cardiac Enzymes: No results for input(s): CKTOTAL, CKMB, CKMBINDEX, TROPONINI in the last 168 hours. BNP (last 3 results) No results for input(s): PROBNP in the last 8760 hours. CBG:  Recent Labs Lab 02/11/14 0801 02/11/14 0912 02/11/14 1022 02/11/14 1115 02/11/14 1253  GLUCAP 137* 145* 159* 208* 151*    Recent Results (from the past 240 hour(s))  MRSA PCR Screening     Status: None   Collection Time: 02/10/14 10:32 PM  Result Value Ref Range Status   MRSA by PCR NEGATIVE NEGATIVE Final    Comment:        The GeneXpert MRSA Assay (FDA approved for NASAL specimens only), is one component of a comprehensive MRSA colonization surveillance program. It is not intended to diagnose MRSA infection nor to guide or monitor treatment for MRSA infections.      Studies: Dg Hip Complete Left  02/11/2014   CLINICAL DATA:  Left buttock region pain and swelling. No history of trauma  EXAM: LEFT HIP - COMPLETE 2+ VIEW  COMPARISON:  None.  FINDINGS: Frontal pelvis and lateral left hip images were obtained. No fracture or dislocation. Joint spaces appear intact. No erosive change. There appears to be some soft tissue swelling over the left hip region.  IMPRESSION: Soft tissue swelling left hip region. No fracture or dislocation. No appreciable arthropathy.   Electronically Signed   By: Lowella Grip M.D.   On: 02/11/2014 10:46   Ct Pelvis W Contrast  02/10/2014   CLINICAL DATA:  Left buttock swelling. Question abscess. No trauma.  EXAM: CT PELVIS WITH CONTRAST  TECHNIQUE: Multidetector CT imaging of the pelvis was performed using the standard protocol following the bolus administration of intravenous contrast.  CONTRAST:  161mL OMNIPAQUE IOHEXOL 300 MG/ML  SOLN  COMPARISON:  CT 07/11/2002  FINDINGS: Along the inferior medial border of the left gluteus  maximus muscle there ovoid mass measuring 9.4 x 4.7x 5.5 cm. The mass has similar density to adjacent muscle. There is edema within the subcutaneous fat superficial to this mass lesion suggesting infection. The mass extends from the coccyx laterally towards the femur. Mass lesion is seen on image 39, series 2.  There is no evidence perianal fistula. There is no evidence of abscess or infection within the peritoneal retroperitoneal space with the pelvis.  Adjacent bone to this presumed infection is normal without cortical erosion.  There are several diverticula of the sigmoid colon without acute inflammation. The uterus and ovaries are normal. The bladder is intact. Abdominal aorta is partially image appears normal.  IMPRESSION: 1. Large presumed abscess along the medial border of the left gluteus maximus muscle. 2. No evidence of intraperitoneal retroperitoneal infection. These results will be called to the ordering clinician or representative by the Radiologist Assistant, and communication documented in the PACS or zVision Dashboard.   Electronically Signed   By: Suzy Bouchard M.D.   On: 02/10/2014 16:13    Scheduled Meds: . antiseptic oral rinse  7 mL Mouth Rinse BID  . Gerhardt's butt cream   Topical BID  . heparin  5,000 Units Subcutaneous 3 times per day  . insulin aspart  0-9 Units Subcutaneous TID WC  . insulin aspart  3 Units Subcutaneous TID WC  . insulin detemir  10 Units Subcutaneous QHS  . miconazole  1 Applicatorful Vaginal QHS  . piperacillin-tazobactam (ZOSYN)  IV  3.375 g Intravenous Q8H  . sodium chloride  1,000 mL Intravenous Once  . vancomycin  1,000 mg Intravenous Q12H   Continuous Infusions: . sodium chloride 125 mL/hr at 02/10/14 2314  . sodium chloride 100 mL/hr at 02/11/14 1325  . insulin (NOVOLIN-R) infusion Stopped (02/11/14 1119)  . lactated ringers    . lactated ringers      Principal Problem:   Abscess of buttock, left Active Problems:   Diabetes mellitus  without complication   DKA (diabetic ketoacidoses)   Sepsis    Time spent: 35 minutes. Greater than 50% of this time was spent in direct contact with the patient coordinating care.    Lelon Frohlich  [facility] Pager [phone]  If 7PM-7AM, please contact night-coverage at [email or web address], password [facility] 02/11/2014, 3:34 PM  LOS: 1 day

## 2014-02-11 NOTE — Progress Notes (Signed)
Pt with red angry skin in perianal area, complains of itching as well. Pt with urinary urgency and incontinence. T.Callahan NP paged and updated. Monistat Cream ordered. Awaiting from pharmacy.

## 2014-02-11 NOTE — Anesthesia Preprocedure Evaluation (Addendum)
Anesthesia Evaluation  Patient identified by MRN, date of birth, ID band Patient awake    Reviewed: Allergy & Precautions, H&P , NPO status , Patient's Chart, lab work & pertinent test results  Airway Mallampati: II  TM Distance: >3 FB Neck ROM: full    Dental  (+) Missing, Dental Advisory Given Missing all of front teeth on left side:   Pulmonary neg pulmonary ROS,  breath sounds clear to auscultation  Pulmonary exam normal       Cardiovascular Exercise Tolerance: Good negative cardio ROS  Rhythm:regular Rate:Normal     Neuro/Psych negative neurological ROS  negative psych ROS   GI/Hepatic negative GI ROS, Neg liver ROS,   Endo/Other  diabetes, Type 2, Insulin Dependent  Renal/GU negative Renal ROS  negative genitourinary   Musculoskeletal   Abdominal   Peds  Hematology negative hematology ROS (+)   Anesthesia Other Findings   Reproductive/Obstetrics negative OB ROS                           Anesthesia Physical Anesthesia Plan  ASA: III  Anesthesia Plan: General   Post-op Pain Management:    Induction: Intravenous  Airway Management Planned: Oral ETT  Additional Equipment:   Intra-op Plan:   Post-operative Plan: Extubation in OR  Informed Consent: I have reviewed the patients History and Physical, chart, labs and discussed the procedure including the risks, benefits and alternatives for the proposed anesthesia with the patient or authorized representative who has indicated his/her understanding and acceptance.   Dental Advisory Given  Plan Discussed with: CRNA and Surgeon  Anesthesia Plan Comments:        Anesthesia Quick Evaluation

## 2014-02-11 NOTE — Progress Notes (Signed)
ANTIBIOTIC CONSULT NOTE - FOLLOW UP  Pharmacy Consult for Vancomycin and Zosyn Indication: Buttock Abscess  No Known Allergies  Patient Measurements: Height: 5\' 3"  (160 cm) Weight: 157 lb 10.1 oz (71.5 kg) IBW/kg (Calculated) : 52.4  Vital Signs: Temp: 99.1 F (37.3 C) (12/20 1330) Temp Source: Oral (12/20 0800) BP: 107/52 mmHg (12/20 1330) Pulse Rate: 85 (12/20 1330) Intake/Output from previous day: 12/19 0701 - 12/20 0700 In: 1333.4 [I.V.:1033.4; IV Piggyback:300] Out: 275 [Urine:275]  Labs:  Recent Labs  02/10/14 1127  02/10/14 1919 02/11/14 0049 02/11/14 0300 02/11/14 0629  WBC 16.5*  --  11.9*  --   --   --   HGB 12.9  --  13.2  --   --   --   PLT  --   --  288  --   --   --   CREATININE  --   < > 0.53 0.47* 0.48* 0.54  < > = values in this interval not displayed. Estimated Creatinine Clearance: 66.4 mL/min (by C-G formula based on Cr of 0.54). No results for input(s): VANCOTROUGH, VANCOPEAK, VANCORANDOM, GENTTROUGH, GENTPEAK, GENTRANDOM, TOBRATROUGH, TOBRAPEAK, TOBRARND, AMIKACINPEAK, AMIKACINTROU, AMIKACIN in the last 72 hours.    Assessment: 30 yoF presenting 12/19 with left buttock swelling and pain.  CT shows abscess, now s/p I&D in OR on 12/20.  Pharmacy consulted to dose Vancomycin and Zosyn.  12/19 >> vanc >> 12/19 >> zosyn >>   Today, 02/11/2014:   Tmax: 100.1  WBCs: 11.9 (12/19)  Renal: Scr 0.54 with CrCl ~ 66 ml/min  Cultures pending   Goal of Therapy:  Vancomycin trough level 15-20 mcg/ml  Plan:   Continue Zosyn 3.375g IV Q8H infused over 4hrs.   Increase to Vancomycin 1g IV q12h.  Measure Vanc trough at steady state.  Follow up renal fxn and culture results.  Gretta Arab PharmD, BCPS Pager [phone] 02/11/2014 1:38 PM

## 2014-02-11 NOTE — Op Note (Signed)
Operative Note  Bailey [name] female 63 y.o. 02/11/2014  PREOPERATIVE DX:  Left buttock abscess  POSTOPERATIVE DX:  Same  PROCEDURE:   Complex incision and drainage of left buttock's         Surgeon: Odis Hollingshead   Assistants: none  Anesthesia: General endotracheal anesthesia  Indications:   This is a 64 year old diabetic female with an enlarging painful mass in the left buttock consistent with abscess by CT and on exam. She now brought to the operating room for drainage.    Procedure Detail:  The left buttock was marked with my initials. She is brought to the operating room placed supine on the operating table and a general anesthetic was given. She was then placed in the right lateral decubitus position and padded appropriately. The left buttock was sterilely prepped and draped.  An incision was made directly over the firm indurated area and carried through the subcutaneous tissue and muscle. Purulent fluid was then evacuated some of which was sent for culture. I digitally broke up loculations. There the cavity size was approximately 10-12 cm. The wound was copiously irrigated.  A half-inch Penrose drain was then placed into the wound. It was anchored to a safety pin which had been anchored to the skin to allow for gradual withdrawal. Hemostasis was adequate. A bulky dry dressing was applied.  She tolerated the procedure well without any apparent complications was taken to the recovery room in satisfactory condition.   Estimated Blood Loss:  150 ml         Drains: PENROSE                Specimens: abscess fluid sent for culture        Complications:  * No complications entered in OR log *         Disposition: PACU - hemodynamically stable.         Condition: stable

## 2014-02-11 NOTE — Transfer of Care (Signed)
Immediate Anesthesia Transfer of Care Note  Patient: Bailey [name]  Procedure(s) Performed: Procedure(s): INCISION AND DRAINAGE ABSCESS Left Buttock (Left)  Patient Location: PACU  Anesthesia Type:General  Level of Consciousness: awake and alert   Airway & Oxygen Therapy: Patient Spontanous Breathing and Patient connected to face mask oxygen  Post-op Assessment: Report given to PACU RN and Post -op Vital signs reviewed and stable  Post vital signs: Reviewed and stable  Complications: No apparent anesthesia complications

## 2014-02-11 NOTE — Anesthesia Postprocedure Evaluation (Signed)
  Anesthesia Post-op Note  Patient: Bailey [name]  Procedure(s) Performed: Procedure(s) (LRB): INCISION AND DRAINAGE ABSCESS Left Buttock (Left)  Patient Location: PACU  Anesthesia Type: General  Level of Consciousness: awake and alert   Airway and Oxygen Therapy: Patient Spontanous Breathing  Post-op Pain: mild  Post-op Assessment: Post-op Vital signs reviewed, Patient's Cardiovascular Status Stable, Respiratory Function Stable, Patent Airway and No signs of Nausea or vomiting  Last Vitals:  Filed Vitals:   02/11/14 1320  BP:   Pulse: 87  Temp:   Resp: 26    Post-op Vital Signs: stable   Complications: No apparent anesthesia complications

## 2014-02-11 NOTE — Anesthesia Procedure Notes (Signed)
Procedure Name: Intubation Date/Time: 02/11/2014 12:07 PM Performed by: Erline Hau Pre-anesthesia Checklist: Patient identified, Emergency Drugs available, Suction available, Patient being monitored and Timeout performed Patient Re-evaluated:Patient Re-evaluated prior to inductionOxygen Delivery Method: Circle system utilized Preoxygenation: Pre-oxygenation with 100% oxygen Intubation Type: IV induction Ventilation: Mask ventilation without difficulty Laryngoscope Size: Miller and 3 Grade View: Grade I Tube type: Oral Tube size: 7.5 mm Number of attempts: 1 Placement Confirmation: ETT inserted through vocal cords under direct vision,  positive ETCO2,  CO2 detector and breath sounds checked- equal and bilateral Secured at: 22 cm Dental Injury: Teeth and Oropharynx as per pre-operative assessment

## 2014-02-11 NOTE — Progress Notes (Signed)
DKA better.  Glucose 206 this AM.  To OR later this AM for incision and drainage of buttock abscess.

## 2014-02-12 ENCOUNTER — Encounter (HOSPITAL_COMMUNITY): Payer: Self-pay | Admitting: General Surgery

## 2014-02-12 LAB — BASIC METABOLIC PANEL
ANION GAP: 12 (ref 5–15)
BUN: 14 mg/dL (ref 6–23)
CALCIUM: 8 mg/dL — AB (ref 8.4–10.5)
CO2: 19 mEq/L (ref 19–32)
CREATININE: 0.7 mg/dL (ref 0.50–1.10)
Chloride: 104 mEq/L (ref 96–112)
GFR calc non Af Amer: 89 mL/min — ABNORMAL LOW (ref >90)
Glucose, Bld: 130 mg/dL — ABNORMAL HIGH (ref 70–99)
Potassium: 3.4 mEq/L — ABNORMAL LOW (ref 3.7–5.3)
Sodium: 135 mEq/L — ABNORMAL LOW (ref 137–147)

## 2014-02-12 LAB — CBC
HCT: 29.9 % — ABNORMAL LOW (ref 36.0–46.0)
Hemoglobin: 9.8 g/dL — ABNORMAL LOW (ref 12.0–15.0)
MCH: 29.7 pg (ref 26.0–34.0)
MCHC: 32.8 g/dL (ref 30.0–36.0)
MCV: 90.6 fL (ref 78.0–100.0)
Platelets: 213 10*3/uL (ref 150–400)
RBC: 3.3 MIL/uL — ABNORMAL LOW (ref 3.87–5.11)
RDW: 13.3 % (ref 11.5–15.5)
WBC: 10.7 10*3/uL — ABNORMAL HIGH (ref 4.0–10.5)

## 2014-02-12 LAB — GLUCOSE, CAPILLARY
GLUCOSE-CAPILLARY: 128 mg/dL — AB (ref 70–99)
Glucose-Capillary: 116 mg/dL — ABNORMAL HIGH (ref 70–99)
Glucose-Capillary: 165 mg/dL — ABNORMAL HIGH (ref 70–99)
Glucose-Capillary: 290 mg/dL — ABNORMAL HIGH (ref 70–99)

## 2014-02-12 MED ORDER — DOCUSATE SODIUM 100 MG PO CAPS
100.0000 mg | ORAL_CAPSULE | Freq: Two times a day (BID) | ORAL | Status: DC
  Administered 2014-02-12 – 2014-02-14 (×5): 100 mg via ORAL
  Filled 2014-02-12 (×6): qty 1

## 2014-02-12 MED ORDER — POTASSIUM CHLORIDE IN NACL 20-0.9 MEQ/L-% IV SOLN
INTRAVENOUS | Status: DC
  Administered 2014-02-12 (×2): via INTRAVENOUS
  Filled 2014-02-12 (×5): qty 1000

## 2014-02-12 MED ORDER — INSULIN STARTER KIT- SYRINGES (ENGLISH)
1.0000 | Freq: Once | Status: AC
  Administered 2014-02-12: 1
  Filled 2014-02-12: qty 1

## 2014-02-12 MED ORDER — INSULIN DETEMIR 100 UNIT/ML ~~LOC~~ SOLN
15.0000 [IU] | Freq: Every day | SUBCUTANEOUS | Status: DC
  Administered 2014-02-12: 15 [IU] via SUBCUTANEOUS
  Filled 2014-02-12: qty 0.15

## 2014-02-12 MED ORDER — LIVING WELL WITH DIABETES BOOK
Freq: Once | Status: AC
  Administered 2014-02-12: 17:00:00
  Filled 2014-02-12: qty 1

## 2014-02-12 MED ORDER — ZOLPIDEM TARTRATE 5 MG PO TABS
5.0000 mg | ORAL_TABLET | Freq: Once | ORAL | Status: AC
  Administered 2014-02-12: 5 mg via ORAL
  Filled 2014-02-12: qty 1

## 2014-02-12 NOTE — Evaluation (Signed)
Physical Therapy One TIme Evaluation Patient Details Name: Bailey [name] MRN: [id number] DOB: Jun 14, 1950 Today's Date: 02/12/2014   History of Present Illness  Pt is a 63 year old female s/p I & D for L gluteal abscess 12/20.  Clinical Impression  Patient evaluated by Physical Therapy with no further acute PT needs identified. All education has been completed and the patient has no further questions.  Pt mobilizing well s/p L gluteal I&D.  Pt reports performing yard sale this past weekend and doing standing exercises while waiting, she reports feeling a "catching" sensation L gluteal area and has been having increased pain lately leading to admission.  Pt states she ambulates around yard every day which has hills so discussed flat surface activity for now due to strain on gluteal muscles with increased terrain. No further needs identified and pt agreeable to ambulate as tolerated during hospitalization. PT is signing off. Thank you for this referral.     Follow Up Recommendations No PT follow up    Equipment Recommendations  None recommended by PT    Recommendations for Other Services       Precautions / Restrictions Precautions Precautions: Fall Precaution Comments: bandage on L gluteal area from I and D Restrictions Weight Bearing Restrictions: No      Mobility  Bed Mobility Overal bed mobility: Needs Assistance Bed Mobility: Supine to Sit;Sit to Supine     Supine to sit: Supervision Sit to supine: Supervision   General bed mobility comments: increased time and VC for sequencing  Transfers Overall transfer level: Needs assistance Equipment used: Rolling walker (2 wheeled) Transfers: Sit to/from Stand Sit to Stand: Supervision         General transfer comment: RW used to stand however no device upon sitting  Ambulation/Gait Ambulation/Gait assistance: Min guard;Supervision Ambulation Distance (Feet): 400 Feet Assistive device: Rolling walker (2  wheeled);None Gait Pattern/deviations: Step-through pattern     General Gait Details: pt utilized RW initially however able to progress to no UE support, has RW at home if needed however no unsteadiness or LOB observed  Financial trader Rankin (Stroke Patients Only)       Balance Overall balance assessment: No apparent balance deficits (not formally assessed)                                           Pertinent Vitals/Pain Pain Assessment: 0-10 Pain Score: 5  Pain Location: L gluteal area during ambulation Pain Descriptors / Indicators: Sore Pain Intervention(s): Monitored during session;Limited activity within patient's tolerance;Patient requesting pain meds-RN notified    Home Living Family/patient expects to be discharged to:: Private residence Living Arrangements: Spouse/significant other Available Help at Discharge: Family Type of Home: House Home Access: Stairs to enter Entrance Stairs-Rails: Can reach both Entrance Stairs-Number of Steps: 6 in front Home Layout: One level [place]: Environmental consultant - 2 wheels      Prior Function Level of Independence: Independent               Hand Dominance        Extremity/Trunk Assessment   Upper Extremity Assessment: Generalized weakness           Lower Extremity Assessment: Overall WFL for tasks assessed         Communication   Communication: No difficulties  Cognition Arousal/Alertness: Awake/alert Behavior During Therapy: WFL for tasks assessed/performed Overall Cognitive Status: Within Functional Limits for tasks assessed                      General Comments      Exercises        Assessment/Plan    PT Assessment Patent does not need any further PT services  PT Diagnosis Difficulty walking   PT Problem List    PT Treatment Interventions     PT Goals (Current goals can be found in the Care Plan section) Acute Rehab PT  Goals Patient Stated Goal: home before holiday PT Goal Formulation: All assessment and education complete, DC therapy    Frequency     Barriers to discharge        Co-evaluation               End of Session   Activity Tolerance: Patient tolerated treatment well Patient left: in bed;with call bell/phone within reach Nurse Communication: Patient requests pain meds;Mobility status         Time: 5885-0277 PT Time Calculation (min) (ACUTE ONLY): 13 min   Charges:   PT Evaluation $Initial PT Evaluation Tier I: 1 Procedure PT Treatments $Gait Training: 8-22 mins   PT G Codes:          Alesi Zachery,KATHrine E 02/12/2014, 3:44 PM Carmelia Bake, PT, DPT 02/12/2014 Pager: [phone]

## 2014-02-12 NOTE — Progress Notes (Signed)
[facility] PROGRESS NOTE  Bailey [name] EVO:[id number] DOB: [birth year] DOA: 02/10/2014 PCP: No PCP Per Patient    Assessment/Plan: Buttock Abscess -Continue vanc/zosyn Culture pending . Status post incision and drainage of left buttock abscess Appreciate general surgery input   DKA -GAP closed and bicarb 19 Continue Lantus SSI -Admits to being non-compliant with insulin and was only using her metformin. Diabetes coordinator consultation Insulin teaching   Code Status: Full code Family Communication: husband at  bedside Disposition Plan: Transfer to [place]   Consultants:  Surgery   Antibiotics:  Vancomycin 12/19-->  Zosyn 12/19-->  Subjective: C/o pain in back and buttocks  Objective: Filed Vitals:   02/12/14 0700 02/12/14 0800 02/12/14 0900 02/12/14 1000  BP: 92/41 92/51 110/46 95/47  Pulse: 75 75 78 79  Temp:  97.6 F (36.4 C)    TempSrc:  Oral    Resp: $Remo'26 18 20 23  'CHHpl$ Height:      Weight:      SpO2: 96% 97% 96% 98%    Intake/Output Summary (Last 24 hours) at 02/12/14 1128 Last data filed at 02/12/14 0800  Gross per 24 hour  Intake 3508.33 ml  Output    650 ml  Net 2858.33 ml   Filed Weights   02/10/14 2300 02/11/14 0400 02/12/14 0400  Weight: 71.5 kg (157 lb 10.1 oz) 71.5 kg (157 lb 10.1 oz) 74.2 kg (163 lb 9.3 oz)    Exam:   General:  AA OX3  Cardiovascular: RRR  Respiratory: CTA B  Abdomen: S/NT/ND/+BS  Extremities: no C/C/E  Neurologic:  Non-focal  Data Reviewed: Basic Metabolic Panel:  Recent Labs Lab 02/10/14 1919 02/11/14 0049 02/11/14 0300 02/11/14 0629 02/12/14 0339  NA 130* 133* 135* 134* 135*  K 3.6* 4.0 3.6* 3.9 3.4*  CL 88* 98 99 102 104  CO2 $Re'21 19 19 20 19  'BYO$ GLUCOSE 563* 237* 206* 126* 130*  BUN $Re'18 13 13 12 14  'Ahj$ CREATININE 0.53 0.47* 0.48* 0.54 0.70  CALCIUM 9.6 8.4 8.5 8.4 8.0*   Liver Function Tests: No results for input(s): AST, ALT, ALKPHOS, BILITOT, PROT, ALBUMIN in the last 168  hours. No results for input(s): LIPASE, AMYLASE in the last 168 hours. No results for input(s): AMMONIA in the last 168 hours. CBC:  Recent Labs Lab 02/10/14 1127 02/10/14 1919 02/12/14 0339  WBC 16.5* 11.9* 10.7*  NEUTROABS  --  10.2*  --   HGB 12.9 13.2 9.8*  HCT 38.7 39.0 29.9*  MCV 89.6 88.0 90.6  PLT  --  288 213   Cardiac Enzymes: No results for input(s): CKTOTAL, CKMB, CKMBINDEX, TROPONINI in the last 168 hours. BNP (last 3 results) No results for input(s): PROBNP in the last 8760 hours. CBG:  Recent Labs Lab 02/11/14 1253 02/11/14 1512 02/11/14 1740 02/11/14 2125 02/12/14 0729  GLUCAP 151* 123* 167* 171* 116*    Recent Results (from the past 240 hour(s))  Urine culture     Status: None (Preliminary result)   Collection Time: 02/10/14 12:20 PM  Result Value Ref Range Status   Preliminary Report Culture reincubated for better growth  Preliminary  MRSA PCR Screening     Status: None   Collection Time: 02/10/14 10:32 PM  Result Value Ref Range Status   MRSA by PCR NEGATIVE NEGATIVE Final    Comment:        The GeneXpert MRSA Assay (FDA approved for NASAL specimens only), is one component of a comprehensive MRSA colonization surveillance program. It is  not intended to diagnose MRSA infection nor to guide or monitor treatment for MRSA infections.   Culture, blood (routine x 2)     Status: None (Preliminary result)   Collection Time: 02/10/14 10:33 PM  Result Value Ref Range Status   Specimen Description BLOOD LEFT HAND  Final   Special Requests BOTTLES DRAWN AEROBIC ONLY 5CC  Final   Culture  Setup Time   Final    02/11/2014 04:13 Performed at [facility]    Culture   Final           BLOOD CULTURE RECEIVED NO GROWTH TO DATE CULTURE WILL BE HELD FOR 5 DAYS BEFORE ISSUING A FINAL NEGATIVE REPORT Performed at [facility]    Report Status PENDING  Incomplete  Culture, blood (routine x 2)     Status: None (Preliminary result)    Collection Time: 02/10/14 10:38 PM  Result Value Ref Range Status   Specimen Description BLOOD TIGHT HAND RIGHT HAND  Final   Special Requests BOTTLES DRAWN AEROBIC ONLY 5CC  Final   Culture  Setup Time   Final    02/11/2014 04:13 Performed at [facility]    Culture   Final           BLOOD CULTURE RECEIVED NO GROWTH TO DATE CULTURE WILL BE HELD FOR 5 DAYS BEFORE ISSUING A FINAL NEGATIVE REPORT Performed at [facility]    Report Status PENDING  Incomplete     Studies: Dg Hip Complete Left  02/11/2014   CLINICAL DATA:  Left buttock region pain and swelling. No history of trauma  EXAM: LEFT HIP - COMPLETE 2+ VIEW  COMPARISON:  None.  FINDINGS: Frontal pelvis and lateral left hip images were obtained. No fracture or dislocation. Joint spaces appear intact. No erosive change. There appears to be some soft tissue swelling over the left hip region.  IMPRESSION: Soft tissue swelling left hip region. No fracture or dislocation. No appreciable arthropathy.   Electronically Signed   By: Lowella Grip M.D.   On: 02/11/2014 10:46   Ct Pelvis W Contrast  02/10/2014   CLINICAL DATA:  Left buttock swelling. Question abscess. No trauma.  EXAM: CT PELVIS WITH CONTRAST  TECHNIQUE: Multidetector CT imaging of the pelvis was performed using the standard protocol following the bolus administration of intravenous contrast.  CONTRAST:  111mL OMNIPAQUE IOHEXOL 300 MG/ML  SOLN  COMPARISON:  CT 07/11/2002  FINDINGS: Along the inferior medial border of the left gluteus maximus muscle there ovoid mass measuring 9.4 x 4.7x 5.5 cm. The mass has similar density to adjacent muscle. There is edema within the subcutaneous fat superficial to this mass lesion suggesting infection. The mass extends from the coccyx laterally towards the femur. Mass lesion is seen on image 39, series 2.  There is no evidence perianal fistula. There is no evidence of abscess or infection within the peritoneal retroperitoneal  space with the pelvis.  Adjacent bone to this presumed infection is normal without cortical erosion.  There are several diverticula of the sigmoid colon without acute inflammation. The uterus and ovaries are normal. The bladder is intact. Abdominal aorta is partially image appears normal.  IMPRESSION: 1. Large presumed abscess along the medial border of the left gluteus maximus muscle. 2. No evidence of intraperitoneal retroperitoneal infection. These results will be called to the ordering clinician or representative by the Radiologist Assistant, and communication documented in the PACS or zVision Dashboard.   Electronically Signed   By: Nicole Kindred  Leonia Reeves M.D.   On: 02/10/2014 16:13    Scheduled Meds: . antiseptic oral rinse  7 mL Mouth Rinse BID  . docusate sodium  100 mg Oral BID  . Gerhardt's butt cream   Topical BID  . heparin  5,000 Units Subcutaneous 3 times per day  . insulin aspart  0-9 Units Subcutaneous TID WC  . insulin aspart  3 Units Subcutaneous TID WC  . insulin detemir  15 Units Subcutaneous QHS  . miconazole  1 Applicatorful Vaginal QHS  . piperacillin-tazobactam (ZOSYN)  IV  3.375 g Intravenous Q8H  . sodium chloride  1,000 mL Intravenous Once  . vancomycin  1,000 mg Intravenous Q12H   Continuous Infusions: . 0.9 % NaCl with KCl 20 mEq / L 125 mL/hr at 02/12/14 1024  . lactated ringers Stopped (02/11/14 1900)  . lactated ringers Stopped (02/11/14 1900)    Principal Problem:   Abscess of buttock, left Active Problems:   Diabetes mellitus without complication   DKA (diabetic ketoacidoses)   Sepsis    Time spent: 35 minutes. Greater than 50% of this time was spent in direct contact with the patient coordinating care.    [facility]  [facility] Pager [phone]  If 7PM-7AM, please contact night-coverage at [email or web address], password [facility] 02/12/2014, 11:28 AM  LOS: 2 days

## 2014-02-12 NOTE — Evaluation (Signed)
Occupational Therapy Evaluation Patient Details Name: Bailey [name] MRN: [id number] DOB: [birth year] Today's Date: 02/12/2014    History of Present Illness Pt admitted with L gluteal abscess.  Pt did have  Incision and drainage left gluteal abscess 12/20.    Clinical Impression   Pt admitted with L gluteal abscess. Pt currently with functional limitations due to the deficits listed below (see OT Problem List).  Pt will benefit from skilled OT to increase their safety and independence with ADL and functional mobility for ADL to facilitate discharge to venue listed below.      Follow Up Recommendations  Supervision - Intermittent;No OT follow up    Equipment Recommendations  None recommended by OT    Recommendations for Other Services       Precautions / Restrictions Precautions Precautions: Fall Precaution Comments: bandage on L gluteal area from I and D      Mobility Bed Mobility Overal bed mobility: Needs Assistance Bed Mobility: Supine to Sit     Supine to sit: Min assist     General bed mobility comments: increased time and VC for sequencing  Transfers Overall transfer level: Needs assistance Equipment used: Rolling walker (2 wheeled) Transfers: Sit to/from Stand Sit to Stand: Min assist                   ADL Overall ADL's : Needs assistance/impaired     Grooming: Sitting;Set up   Upper Body Bathing: Set up;Sitting   Lower Body Bathing: Moderate assistance;Sit to/from stand   Upper Body Dressing : Set up;Sitting   Lower Body Dressing: Moderate assistance;Sit to/from stand   Toilet Transfer: Minimal assistance;BSC;RW   Toileting- Clothing Manipulation and Hygiene: Minimal assistance;Sit to/from stand       Functional mobility during ADLs: Rolling walker;Minimal assistance;Cueing for safety                 Pertinent Vitals/Pain Pain Assessment: 0-10 Pain Score: 3  Pain Location: L gluteal area Pain Descriptors / Indicators:  Sore Pain Intervention(s): Monitored during session;Limited activity within patient's tolerance;Repositioned     Hand Dominance     Extremity/Trunk Assessment Upper Extremity Assessment Upper Extremity Assessment: Generalized weakness           Communication Communication Communication: No difficulties   Cognition Arousal/Alertness: Awake/alert Behavior During Therapy: WFL for tasks assessed/performed Overall Cognitive Status: Within Functional Limits for tasks assessed                     General Comments   pt needed to go to rest room - opted for bed side commode            Home Living Family/patient expects to be discharged to:: Private residence Living Arrangements: Spouse/significant other Available Help at Discharge: Family Type of Home: House Home Access: Stairs to enter [employer] of Steps: 6 in front Entrance Stairs-Rails: Can reach both Home Layout: One level     Bathroom Shower/Tub: Tub/shower unit;Walk-in shower   Bathroom Toilet: Standard     Home Equipment: None          Prior Functioning/Environment Level of Independence: Independent             OT Diagnosis: Generalized weakness;Acute pain   OT Problem List: Decreased strength;Decreased activity tolerance   OT Treatment/Interventions: Self-care/ADL training;Patient/family education;DME and/or AE instruction    OT Goals(Current goals can be found in the care plan section) Acute Rehab OT Goals Patient Stated Goal: home before holiday OT  Goal Formulation: With patient Time For Goal Achievement: 02/19/14 ADL Goals Pt Will Perform Grooming: with modified independence;standing Pt Will Perform Lower Body Dressing: with modified independence;sit to/from stand Pt Will Transfer to Toilet: with modified independence;regular height toilet;ambulating Pt Will Perform Toileting - Clothing Manipulation and hygiene: with modified independence;sit to/from stand Pt Will Perform  Tub/Shower Transfer: Shower transfer;with supervision  OT Frequency: Min 2X/week              End of Session Nurse Communication: Mobility status  Activity Tolerance: Patient tolerated treatment well Patient left: in bed;with call bell/phone within reach   Time: 1228-1247 OT Time Calculation (min): 19 min Charges:  OT General Charges $OT Visit: 1 Procedure OT Evaluation $Initial OT Evaluation Tier I: 1 Procedure OT Treatments $Self Care/Home Management : 8-22 mins G-Codes:    Payton Mccallum D 02-20-14, 12:51 PM

## 2014-02-12 NOTE — Progress Notes (Signed)
1 Day Post-Op  Subjective: Stable. Alert. Cooperative. Next line states pain in left gluteal area is much better. Glucose 130. Potassium 3.4. Hemoglobin 9.8. WBC 10,700. Creatinine 0.7.  Objective: Vital signs in last 24 hours: Temp:  [97.2 F (36.2 C)-99.6 F (37.6 C)] 98.3 F (36.8 C) (12/21 0400) Pulse Rate:  [73-95] 75 (12/21 0600) Resp:  [12-31] 22 (12/21 0600) BP: (86-131)/(35-86) 98/44 mmHg (12/21 0600) SpO2:  [95 %-100 %] 97 % (12/21 0600) Weight:  [163 lb 9.3 oz (74.2 kg)] 163 lb 9.3 oz (74.2 kg) (12/21 0400) Last BM Date: 02/10/14  Intake/Output from previous day: 12/20 0701 - 12/21 0700 In: 3765.5 [P.O.:360; I.V.:2905.5; IV Piggyback:500] Out: 800 [Urine:800] Intake/Output this shift: Total I/O In: 1350 [I.V.:1100; IV Piggyback:250] Out: 600 [Urine:600]  General appearance: Alert. No distress. Cooperative. Oriented. Mental status normal. Slightly deconditioned. GI: soft, non-tender; bowel sounds normal; no masses,  no organomegaly Incision/Wound:Left gluteal wound examined. Skin is intact without necrosis or significant cellulitis. Penrose drain in place with serosanguineous drainage. No odor. Redressed. Tissues are soft.  Lab Results:  Results for orders placed or performed during the hospital encounter of 02/10/14 (from the past 24 hour(s))  Glucose, capillary     Status: Abnormal   Collection Time: 02/11/14  6:55 AM  Result Value Ref Range   Glucose-Capillary 119 (H) 70 - 99 mg/dL  Glucose, capillary     Status: Abnormal   Collection Time: 02/11/14  8:01 AM  Result Value Ref Range   Glucose-Capillary 137 (H) 70 - 99 mg/dL   Comment 1 Documented in Chart    Comment 2 Notify RN   Glucose, capillary     Status: Abnormal   Collection Time: 02/11/14  9:12 AM  Result Value Ref Range   Glucose-Capillary 145 (H) 70 - 99 mg/dL   Comment 1 Documented in Chart    Comment 2 Notify RN   Glucose, capillary     Status: Abnormal   Collection Time: 02/11/14 10:22 AM   Result Value Ref Range   Glucose-Capillary 159 (H) 70 - 99 mg/dL  Glucose, capillary     Status: Abnormal   Collection Time: 02/11/14 11:15 AM  Result Value Ref Range   Glucose-Capillary 208 (H) 70 - 99 mg/dL  Glucose, capillary     Status: Abnormal   Collection Time: 02/11/14 12:53 PM  Result Value Ref Range   Glucose-Capillary 151 (H) 70 - 99 mg/dL   Comment 1 Documented in Chart   Glucose, capillary     Status: Abnormal   Collection Time: 02/11/14  3:12 PM  Result Value Ref Range   Glucose-Capillary 123 (H) 70 - 99 mg/dL   Comment 1 Documented in Chart    Comment 2 Notify RN   Glucose, capillary     Status: Abnormal   Collection Time: 02/11/14  5:40 PM  Result Value Ref Range   Glucose-Capillary 167 (H) 70 - 99 mg/dL   Comment 1 Documented in Chart    Comment 2 Notify RN   Glucose, capillary     Status: Abnormal   Collection Time: 02/11/14  9:25 PM  Result Value Ref Range   Glucose-Capillary 171 (H) 70 - 99 mg/dL  Basic metabolic panel     Status: Abnormal   Collection Time: 02/12/14  3:39 AM  Result Value Ref Range   Sodium 135 (L) 137 - 147 mEq/L   Potassium 3.4 (L) 3.7 - 5.3 mEq/L   Chloride 104 96 - 112 mEq/L   CO2 19  19 - 32 mEq/L   Glucose, Bld 130 (H) 70 - 99 mg/dL   BUN 14 6 - 23 mg/dL   Creatinine, Ser 0.70 0.50 - 1.10 mg/dL   Calcium 8.0 (L) 8.4 - 10.5 mg/dL   GFR calc non Af Amer 89 (L) >90 mL/min   GFR calc Af Amer >90 >90 mL/min   Anion gap 12 5 - 15  CBC     Status: Abnormal   Collection Time: 02/12/14  3:39 AM  Result Value Ref Range   WBC 10.7 (H) 4.0 - 10.5 K/uL   RBC 3.30 (L) 3.87 - 5.11 MIL/uL   Hemoglobin 9.8 (L) 12.0 - 15.0 g/dL   HCT 29.9 (L) 36.0 - 46.0 %   MCV 90.6 78.0 - 100.0 fL   MCH 29.7 26.0 - 34.0 pg   MCHC 32.8 30.0 - 36.0 g/dL   RDW 13.3 11.5 - 15.5 %   Platelets 213 150 - 400 K/uL     Studies/Results: No results found.  Marland Kitchen antiseptic oral rinse  7 mL Mouth Rinse BID  . docusate sodium  100 mg Oral BID  . Gerhardt's butt  cream   Topical BID  . heparin  5,000 Units Subcutaneous 3 times per day  . insulin aspart  0-9 Units Subcutaneous TID WC  . insulin aspart  3 Units Subcutaneous TID WC  . insulin detemir  10 Units Subcutaneous QHS  . miconazole  1 Applicatorful Vaginal QHS  . piperacillin-tazobactam (ZOSYN)  IV  3.375 g Intravenous Q8H  . sodium chloride  1,000 mL Intravenous Once  . vancomycin  1,000 mg Intravenous Q12H     Assessment/Plan: s/p Procedure(s): INCISION AND DRAINAGE ABSCESS Left Buttock  POD #1. Incision and drainage left gluteal abscess. Neck line appears adequately drained Wound care ordered Advance diet and activities. Continue IV Zosyn.  DVT prophylaxis. On subcutaneous heparin.  Diabetes. Admitted with DKA that this seems to have resolved.  appreciate all of the management by [facility]. It appears that she has been noncompliant as outpatient. Patient states that she will resume seeing [name] as outpatient.  $RemoveBefo'@PROBHOSP'QAnhpxXpgZv$ @  LOS: 2 days    Tlaloc Taddei M 02/12/2014  . .prob

## 2014-02-12 NOTE — Progress Notes (Signed)
Inpatient Diabetes Program Recommendations  AACE/ADA: New Consensus Statement on Inpatient Glycemic Control (2013)  Target Ranges:  Prepandial:   less than 140 mg/dL      Peak postprandial:   less than 180 mg/dL (1-2 hours)      Critically ill patients:  140 - 180 mg/dL   Reason for Visit:  Diabetes Consult  Diabetes history: DM2 Outpatient Diabetes medications: None Current orders for Inpatient glycemic control: Levemir 15 units QHS, Novolog sensitive tidwc + 3 units tidwc  Results for WAUNETA, SILVERIA (MRN [id number]) as of 02/12/2014 16:31  Ref. Range 02/11/2014 15:12 02/11/2014 17:40 02/11/2014 21:25 02/12/2014 07:29 02/12/2014 12:08  Glucose-Capillary Latest Range: 70-99 mg/dL 123 (H) 167 (H) 171 (H) 116 (H) 128 (H)   Results for JAIDYNN, BALSTER (MRN [id number]) as of 02/12/2014 16:31  Ref. Range 02/11/2014 06:29  Hgb A1c MFr Bld Latest Range: <5.7 % 12.9 (H)   Transitioned to Levemir-Novolog after criteria met for discontinuation of GlucoStabilizer. AG is 12   Inpatient Diabetes Program Recommendations Correction (SSI): Add HS correction HgbA1C: 12.9% uncontrolled Outpatient Referral: Will order OP Diabetes Education consult for uncontrolled DM  Note: Pt states she's taken no meds x 2 years d/t costs. Has Medicare now and states her PCP is re-opening her practice in January [name]) and she will be following up with her. Interested in OP Diabetes Education classes. Will order same. Discussed importance of controlling blood sugars to prevent long-term complications. Discussed HgbA1C and insulin administration. Insulin starter kit ordered and RN to begin teaching insulin administration. Encouraged pt to view diabetes videos on pt ed channel. Ordered Living Well With Diabetes book and will f/u with questions tomorrow am.  May need care management to f/u on what copay will be for Levemir and Novolog and whether an insulin pen is covered. May need more affordable insulin with 2  shots/day - 70/30.  Thank you. Lorenda Peck, RD, LDN, CDE Inpatient Diabetes Coordinator [phone]

## 2014-02-13 LAB — COMPREHENSIVE METABOLIC PANEL
ALK PHOS: 408 U/L — AB (ref 39–117)
ALT: 11 U/L (ref 0–35)
AST: 17 U/L (ref 0–37)
Albumin: 2 g/dL — ABNORMAL LOW (ref 3.5–5.2)
Anion gap: 7 (ref 5–15)
BUN: 18 mg/dL (ref 6–23)
CALCIUM: 7.9 mg/dL — AB (ref 8.4–10.5)
CO2: 21 mmol/L (ref 19–32)
Chloride: 111 mEq/L (ref 96–112)
Creatinine, Ser: 0.68 mg/dL (ref 0.50–1.10)
GFR calc non Af Amer: 90 mL/min — ABNORMAL LOW (ref >90)
GLUCOSE: 225 mg/dL — AB (ref 70–99)
POTASSIUM: 4.3 mmol/L (ref 3.5–5.1)
SODIUM: 139 mmol/L (ref 135–145)
Total Bilirubin: 0.4 mg/dL (ref 0.3–1.2)
Total Protein: 5.8 g/dL — ABNORMAL LOW (ref 6.0–8.3)

## 2014-02-13 LAB — GLUCOSE, CAPILLARY
GLUCOSE-CAPILLARY: 159 mg/dL — AB (ref 70–99)
Glucose-Capillary: 152 mg/dL — ABNORMAL HIGH (ref 70–99)
Glucose-Capillary: 171 mg/dL — ABNORMAL HIGH (ref 70–99)

## 2014-02-13 LAB — CULTURE, ROUTINE-ABSCESS

## 2014-02-13 LAB — CBC
HEMATOCRIT: 32.6 % — AB (ref 36.0–46.0)
HEMOGLOBIN: 10.5 g/dL — AB (ref 12.0–15.0)
MCH: 29.5 pg (ref 26.0–34.0)
MCHC: 32.2 g/dL (ref 30.0–36.0)
MCV: 91.6 fL (ref 78.0–100.0)
Platelets: 269 10*3/uL (ref 150–400)
RBC: 3.56 MIL/uL — ABNORMAL LOW (ref 3.87–5.11)
RDW: 13.3 % (ref 11.5–15.5)
WBC: 7.7 10*3/uL (ref 4.0–10.5)

## 2014-02-13 LAB — URINE CULTURE: Colony Count: >=100000

## 2014-02-13 MED ORDER — LEVOFLOXACIN 500 MG PO TABS
500.0000 mg | ORAL_TABLET | Freq: Every day | ORAL | Status: DC
  Administered 2014-02-13 – 2014-02-14 (×2): 500 mg via ORAL
  Filled 2014-02-13 (×3): qty 1

## 2014-02-13 MED ORDER — INSULIN ASPART PROT & ASPART (70-30 MIX) 100 UNIT/ML ~~LOC~~ SUSP
10.0000 [IU] | Freq: Two times a day (BID) | SUBCUTANEOUS | Status: DC
  Administered 2014-02-13 – 2014-02-14 (×2): 10 [IU] via SUBCUTANEOUS
  Filled 2014-02-13: qty 10

## 2014-02-13 NOTE — Progress Notes (Signed)
CARE MANAGEMENT NOTE 02/13/2014  Patient:  Bailey [name], Bailey [name]   Account Number:  [id number]  Date Initiated:  02/13/2014  Documentation initiated by:  Edwyna Shell  Subjective/Objective Assessment:   63 yo female admitted with abscess of left buttock from home     Action/Plan:   discharge planning   Anticipated DC Date:  02/15/2014   Anticipated DC Plan:  [place]  CM consult      Choice offered to / List presented to:             Status of service:  Completed, signed off Medicare Important Message given?  YES (If response is "NO", the following Medicare IM given date fields will be blank) Date Medicare IM given:  02/13/2014 Medicare IM given by:  Edwyna Shell Date Additional Medicare IM given:   Additional Medicare IM given by:    Discharge Disposition:    Per UR Regulation:    If discussed at Long Length of Stay Meetings, dates discussed:    Comments:  02/13/14 Edwyna Shell RN BSN CM [phone] Patient stated that she qualified for Medicare in July but prior to that did not have health insurance or PCP. She stated that she recently found a PCP, Bailey [name], that she will follow up with. Patient stated that she feels confirdent that she will be able to remain on DM medications. Discussed [facility] $4 list in addition to Medicare coverage and provided the patient with [facility] $4 med list.

## 2014-02-13 NOTE — Progress Notes (Signed)
2 Days Post-Op  Subjective: No complaints, pain much improved.    Objective: Vital signs in last 24 hours: Temp:  [98 F (36.7 C)-98.7 F (37.1 C)] 98 F (36.7 C) (12/22 0456) Pulse Rate:  [72-86] 72 (12/22 0456) Resp:  [20-27] 22 (12/22 0456) BP: (95-136)/(47-91) 129/60 mmHg (12/22 0456) SpO2:  [95 %-100 %] 100 % (12/22 0456) Last BM Date: 02/12/14 360 PO 1 BM recorded Afebrile, VSS RR up in the 20-30 range, BP better Glucose improving Intake/Output from previous day: 12/21 0701 - 12/22 0700 In: 3085 [P.O.:360; I.V.:2525; IV Piggyback:200] Out: -  Intake/Output this shift: Total I/O In: 240 [P.O.:240] Out: -   General appearance: alert, cooperative and no distress Skin: Skin color, texture, turgor normal. No rashes or lesions or drain has some purulent drainage on dressing ,but cellulitis is resolved.    Lab Results:   Recent Labs  02/12/14 0339 02/13/14 0441  WBC 10.7* 7.7  HGB 9.8* 10.5*  HCT 29.9* 32.6*  PLT 213 269    BMET  Recent Labs  02/12/14 0339 02/13/14 0500  NA 135* 139  K 3.4* 4.3  CL 104 111  CO2 19 21  GLUCOSE 130* 225*  BUN 14 18  CREATININE 0.70 0.68  CALCIUM 8.0* 7.9*   PT/INR  Recent Labs  02/10/14 2233  LABPROT 18.0*  INR 1.47     Recent Labs Lab 02/13/14 0500  AST 17  ALT 11  ALKPHOS 408*  BILITOT 0.4  PROT 5.8*  ALBUMIN 2.0*     Lipase  No results found for: LIPASE   Studies/Results: No results found.  Medications: . antiseptic oral rinse  7 mL Mouth Rinse BID  . docusate sodium  100 mg Oral BID  . Gerhardt's butt cream   Topical BID  . heparin  5,000 Units Subcutaneous 3 times per day  . insulin aspart  0-9 Units Subcutaneous TID WC  . insulin aspart  3 Units Subcutaneous TID WC  . insulin detemir  15 Units Subcutaneous QHS  . miconazole  1 Applicatorful Vaginal QHS  . piperacillin-tazobactam (ZOSYN)  IV  3.375 g Intravenous Q8H  . sodium chloride  1,000 mL Intravenous Once  . vancomycin  1,000 mg  Intravenous Q12H    Assessment/Plan Left buttock abscess Complex incision and drainage of left buttock's, 02/11/2014, Dr. Jackolyn Confer DKA AODM - A1C 12.9 Prior left great toe amputation  UTI - e coli Plan:  Continue dressing change, advance drain some tomorrow.  Mobilize.    This is day 4 of antibiotics.  Wound culture:  ABUNDANT STAPHYLOCOCCUS AUREUS     Antibiotic Sensitivity Microscan Status     CLINDAMYCIN  RESISTANT Final    Method: MIC    ERYTHROMYCIN Resistant >=8 RESISTANT Final    Method: MIC    GENTAMICIN Sensitive <=0.5 SENSITIVE Final    Method: MIC    LEVOFLOXACIN Sensitive 0.25 SENSITIVE Final    Method: MIC    MOXIFLOXACIN Sensitive <=0.25 SENSITIVE Final    Method: MIC    OXACILLIN Sensitive 0.5 SENSITIVE Final    Method: MIC    PENICILLIN Resistant >=0.5 RESISTANT Final    Method: MIC    RIFAMPIN Sensitive <=0.5 SENSITIVE Final    Method: MIC    TETRACYCLINE Sensitive <=1 SENSITIVE Final    Method: MIC    TRIMETH/SULFA Sensitive <=10 SENSITIVE Final    Method: MIC    VANCOMYCIN Sensitive 1 SENSITIVE Final    Method: MIC    Comments STAPHYLOCOCCUS AUREUS (  MIC)    ABUNDANT STAPHYLOCOCCUS AUREUS             UTI -   Culture ESCHERICHIA COLIP   Colony Count >=100,000 COLONIES/MLP   Sensitive to everything  It looks like Levofloxacin will cover everything.   LOS: 3 days    Bailey [name] 02/13/2014

## 2014-02-13 NOTE — Progress Notes (Signed)
[facility] PROGRESS NOTE  Bailey [name] TKW:[id number] DOB: 01-18-1951 DOA: 02/10/2014 PCP: No PCP Per Patient    Assessment/Plan: Buttock Abscess Discontinue vanc/zosyn , switched to Levaquin Urine culture shows Escherichia coli, wound culture shows staph aureus, both sensitive to Levaquin . Status post incision and drainage of left buttock abscess Appreciate general surgery input   DKA, CBG improving -GAP closed and bicarb 21 Discontinued Levemir, transition to insulin 70/30, 10 units twice a day -Monitor Accu-Cheks today, if stable patient may discharge home tomorrow Diabetes coordinator consultation appreciated Insulin teaching   Code Status: Full code Family Communication: husband at  bedside Disposition Plan: Continue MedSurg, anticipate discharge tomorrow   Consultants:  Surgery   Antibiotics:  Vancomycin 12/19-->  Zosyn 12/19-->  Subjective: C/o pain in back and buttocks  Objective: Filed Vitals:   02/12/14 1413 02/12/14 2103 02/13/14 0456 02/13/14 1052  BP: 136/63 110/91 129/60 133/69  Pulse: 82 86 72 73  Temp: 98.7 F (37.1 C) 98.6 F (37 C) 98 F (36.7 C) 98.1 F (36.7 C)  TempSrc: Oral Oral Oral Oral  Resp: $Remo'24 22 22 20  'qVDiI$ Height:      Weight:      SpO2: 97% 95% 100% 100%    Intake/Output Summary (Last 24 hours) at 02/13/14 1211 Last data filed at 02/13/14 0843  Gross per 24 hour  Intake   2850 ml  Output      0 ml  Net   2850 ml   Filed Weights   02/10/14 2300 02/11/14 0400 02/12/14 0400  Weight: 71.5 kg (157 lb 10.1 oz) 71.5 kg (157 lb 10.1 oz) 74.2 kg (163 lb 9.3 oz)    Exam:   General:  AA OX3  Cardiovascular: RRR  Respiratory: CTA B  Abdomen: S/NT/ND/+BS  Extremities: no C/C/E  Neurologic:  Non-focal  Data Reviewed: Basic Metabolic Panel:  Recent Labs Lab 02/11/14 0049 02/11/14 0300 02/11/14 0629 02/12/14 0339 02/13/14 0500  NA 133* 135* 134* 135* 139  K 4.0 3.6* 3.9 3.4* 4.3  CL 98 99 102  104 111  CO2 $Re'19 19 20 19 21  'vQn$ GLUCOSE 237* 206* 126* 130* 225*  BUN $Re'13 13 12 14 18  'rQS$ CREATININE 0.47* 0.48* 0.54 0.70 0.68  CALCIUM 8.4 8.5 8.4 8.0* 7.9*   Liver Function Tests:  Recent Labs Lab 02/13/14 0500  AST 17  ALT 11  ALKPHOS 408*  BILITOT 0.4  PROT 5.8*  ALBUMIN 2.0*   No results for input(s): LIPASE, AMYLASE in the last 168 hours. No results for input(s): AMMONIA in the last 168 hours. CBC:  Recent Labs Lab 02/10/14 1127 02/10/14 1919 02/12/14 0339 02/13/14 0441  WBC 16.5* 11.9* 10.7* 7.7  NEUTROABS  --  10.2*  --   --   HGB 12.9 13.2 9.8* 10.5*  HCT 38.7 39.0 29.9* 32.6*  MCV 89.6 88.0 90.6 91.6  PLT  --  288 213 269   Cardiac Enzymes: No results for input(s): CKTOTAL, CKMB, CKMBINDEX, TROPONINI in the last 168 hours. BNP (last 3 results) No results for input(s): PROBNP in the last 8760 hours. CBG:  Recent Labs Lab 02/12/14 0729 02/12/14 1208 02/12/14 1657 02/12/14 2151 02/13/14 0739  GLUCAP 116* 128* 165* 290* 152*    Recent Results (from the past 240 hour(s))  Urine culture     Status: None (Preliminary result)   Collection Time: 02/10/14 12:20 PM  Result Value Ref Range Status   Culture ESCHERICHIA COLI  Preliminary   Colony Count >=100,000  COLONIES/ML  Preliminary   Organism ID, Bacteria ESCHERICHIA COLI  Preliminary      Susceptibility   Escherichia coli -  (no method available)    AMPICILLIN <=2 Sensitive     AMOX/CLAVULANIC <=2 Sensitive     AMPICILLIN/SULBACTAM <=2 Sensitive     PIP/TAZO <=4 Sensitive     IMIPENEM <=0.25 Sensitive     CEFAZOLIN <=4 Sensitive     CEFTRIAXONE <=1 Sensitive     CEFTAZIDIME <=1 Sensitive     CEFEPIME <=1 Sensitive     GENTAMICIN <=1 Sensitive     TOBRAMYCIN <=1 Sensitive     CIPROFLOXACIN <=0.25 Sensitive     LEVOFLOXACIN <=0.12 Sensitive     NITROFURANTOIN <=16 Sensitive     TRIMETH/SULFA <=20 Sensitive   MRSA PCR Screening     Status: None   Collection Time: 02/10/14 10:32 PM  Result Value  Ref Range Status   MRSA by PCR NEGATIVE NEGATIVE Final    Comment:        The GeneXpert MRSA Assay (FDA approved for NASAL specimens only), is one component of a comprehensive MRSA colonization surveillance program. It is not intended to diagnose MRSA infection nor to guide or monitor treatment for MRSA infections.   Culture, blood (routine x 2)     Status: None (Preliminary result)   Collection Time: 02/10/14 10:33 PM  Result Value Ref Range Status   Specimen Description BLOOD LEFT HAND  Final   Special Requests BOTTLES DRAWN AEROBIC ONLY 5CC  Final   Culture  Setup Time   Final    02/11/2014 04:13 Performed at [facility]    Culture   Final           BLOOD CULTURE RECEIVED NO GROWTH TO DATE CULTURE WILL BE HELD FOR 5 DAYS BEFORE ISSUING A FINAL NEGATIVE REPORT Note: Culture results may be compromised due to an inadequate volume of blood received in culture bottles. Performed at [facility]    Report Status PENDING  Incomplete  Culture, blood (routine x 2)     Status: None (Preliminary result)   Collection Time: 02/10/14 10:38 PM  Result Value Ref Range Status   Specimen Description BLOOD TIGHT HAND RIGHT HAND  Final   Special Requests BOTTLES DRAWN AEROBIC ONLY 5CC  Final   Culture  Setup Time   Final    02/11/2014 04:13 Performed at [facility]    Culture   Final           BLOOD CULTURE RECEIVED NO GROWTH TO DATE CULTURE WILL BE HELD FOR 5 DAYS BEFORE ISSUING A FINAL NEGATIVE REPORT Performed at [facility]    Report Status PENDING  Incomplete  Anaerobic culture     Status: None (Preliminary result)   Collection Time: 02/11/14 12:32 PM  Result Value Ref Range Status   Specimen Description ABSCESS LEFT BUTTOCK  Final   Special Requests PATIENT ON FOLLOWING VANCOMYCIN AND ZOSYN  Final   Gram Stain PENDING  Incomplete   Culture   Final    NO ANAEROBES ISOLATED; CULTURE IN PROGRESS FOR 5 DAYS Performed at [facility]     Report Status PENDING  Incomplete  Culture, routine-abscess     Status: None   Collection Time: 02/11/14 12:32 PM  Result Value Ref Range Status   Specimen Description ABSCESS LEFT BUTTOCK DRAINAGE  Final   Special Requests PATIENT ON FOLLOWING VANCOMYCIN AND ZOSYN  Final   Gram Stain   Final  ABUNDANT WBC PRESENT,BOTH PMN AND MONONUCLEAR NO SQUAMOUS EPITHELIAL CELLS SEEN FEW GRAM POSITIVE COCCI IN PAIRS IN CLUSTERS Performed at [facility]    Culture   Final    ABUNDANT STAPHYLOCOCCUS AUREUS Note: RIFAMPIN AND GENTAMICIN SHOULD NOT BE USED AS SINGLE DRUGS FOR TREATMENT OF STAPH INFECTIONS. This organism is presumed to be Clindamycin resistant based on detection of inducible Clindamycin resistance. Performed at [facility]    Report Status 02/13/2014 FINAL  Final   Organism ID, Bacteria STAPHYLOCOCCUS AUREUS  Final      Susceptibility   Staphylococcus aureus - MIC*    CLINDAMYCIN RESISTANT      ERYTHROMYCIN >=8 RESISTANT Resistant     GENTAMICIN <=0.5 SENSITIVE Sensitive     LEVOFLOXACIN 0.25 SENSITIVE Sensitive     OXACILLIN 0.5 SENSITIVE Sensitive     PENICILLIN >=0.5 RESISTANT Resistant     RIFAMPIN <=0.5 SENSITIVE Sensitive     TRIMETH/SULFA <=10 SENSITIVE Sensitive     VANCOMYCIN 1 SENSITIVE Sensitive     TETRACYCLINE <=1 SENSITIVE Sensitive     MOXIFLOXACIN <=0.25 SENSITIVE Sensitive     * ABUNDANT STAPHYLOCOCCUS AUREUS     Studies: No results found.  Scheduled Meds: . antiseptic oral rinse  7 mL Mouth Rinse BID  . docusate sodium  100 mg Oral BID  . Gerhardt's butt cream   Topical BID  . heparin  5,000 Units Subcutaneous 3 times per day  . insulin aspart  0-9 Units Subcutaneous TID WC  . insulin aspart protamine- aspart  10 Units Subcutaneous BID WC  . miconazole  1 Applicatorful Vaginal QHS  . piperacillin-tazobactam (ZOSYN)  IV  3.375 g Intravenous Q8H  . sodium chloride  1,000 mL Intravenous Once  . vancomycin  1,000 mg Intravenous Q12H     Continuous Infusions: . lactated ringers Stopped (02/11/14 1900)  . lactated ringers Stopped (02/11/14 1900)    Principal Problem:   Abscess of buttock, left Active Problems:   Diabetes mellitus without complication   DKA (diabetic ketoacidoses)   Sepsis    Time spent: 35 minutes. Greater than 50% of this time was spent in direct contact with the patient coordinating care.    [facility]  [facility] Pager [phone]  If 7PM-7AM, please contact night-coverage at [email or web address], password [facility] 02/13/2014, 12:11 PM  LOS: 3 days

## 2014-02-14 LAB — BASIC METABOLIC PANEL
ANION GAP: 3 — AB (ref 5–15)
BUN: 11 mg/dL (ref 6–23)
CO2: 21 mmol/L (ref 19–32)
CREATININE: 0.52 mg/dL (ref 0.50–1.10)
Calcium: 8.6 mg/dL (ref 8.4–10.5)
Chloride: 114 mEq/L — ABNORMAL HIGH (ref 96–112)
GFR calc Af Amer: >90 mL/min (ref >90)
Glucose, Bld: 201 mg/dL — ABNORMAL HIGH (ref 70–99)
Potassium: 4.6 mmol/L (ref 3.5–5.1)
SODIUM: 138 mmol/L (ref 135–145)

## 2014-02-14 LAB — GLUCOSE, CAPILLARY
GLUCOSE-CAPILLARY: 139 mg/dL — AB (ref 70–99)
Glucose-Capillary: 168 mg/dL — ABNORMAL HIGH (ref 70–99)
Glucose-Capillary: 187 mg/dL — ABNORMAL HIGH (ref 70–99)

## 2014-02-14 MED ORDER — DSS 100 MG PO CAPS: 100.0000 mg | ORAL_CAPSULE | Freq: Two times a day (BID) | ORAL | Status: DC

## 2014-02-14 MED ORDER — INSULIN ASPART PROT & ASPART (70-30 MIX) 100 UNIT/ML ~~LOC~~ SUSP: 15.0000 [IU] | Freq: Two times a day (BID) | SUBCUTANEOUS | Status: DC

## 2014-02-14 MED ORDER — HYDROCODONE-ACETAMINOPHEN 5-325 MG PO TABS: 1.0000 | ORAL_TABLET | Freq: Four times a day (QID) | ORAL | Status: DC | PRN

## 2014-02-14 MED ORDER — LEVOFLOXACIN 500 MG PO TABS: 500.0000 mg | ORAL_TABLET | Freq: Every day | ORAL | Status: DC

## 2014-02-14 NOTE — Progress Notes (Signed)
Inpatient Diabetes Program Recommendations  AACE/ADA: New Consensus Statement on Inpatient Glycemic Control (2013)  Target Ranges:  Prepandial:   less than 140 mg/dL      Peak postprandial:   less than 180 mg/dL (1-2 hours)      Critically ill patients:  140 - 180 mg/dL   Reason for Visit: Hyperglycemia  On 70/30 10 units bid. FBS 201. Will likely need insulin adjustment.  Consider increasing 70/30 to 12 units bid. (May need 15 units bid at home.) Will need prescription for Novolin 70/30 (ReliOn brand) at [facility]. Has meter and supplies at home. Will need f/u appt to PCP who will be back in January. Have discussed hypoglycemia s/s and treatment, importance of spacing meals and eating moderate portion sizes with limited simple CHOs. Ordered OP Diabetes Education consult [facility] will call pt for appt.  Pt seems motivated to control blood sugars at home.  Thank you. Lorenda Peck, RD, LDN, CDE Inpatient Diabetes Coordinator [phone]

## 2014-02-14 NOTE — Progress Notes (Signed)
Discharge instructions given to pt, verbalized understanding. Left the unit in stable condition. 

## 2014-02-14 NOTE — Discharge Summary (Addendum)
Physician Discharge Summary  Bailey [name] MRN: [id number] DOB/AGE: 1950/06/17 63 y.o.  PCP: No PCP Per Patient   Admit date: 02/10/2014 Discharge date: 02/14/2014  Discharge Diagnoses:     Abscess of buttock, left   Diabetes mellitus without complication   DKA (diabetic ketoacidoses)   Sepsis  Follow up recommendations  Follow-up with PCP in 5-7 days Hemoglobin A1c in 3 months Follow-up with general surgery     Medication List    STOP taking these medications        metFORMIN 500 MG tablet  Commonly known as:  GLUCOPHAGE      TAKE these medications        acetaminophen 500 MG tablet  Commonly known as:  TYLENOL  Take 500 mg by mouth every 6 (six) hours as needed for moderate pain (pain).     DSS 100 MG Caps  Take 100 mg by mouth 2 (two) times daily.     HYDROcodone-acetaminophen 5-325 MG per tablet  Commonly known as:  NORCO  Take 1 tablet by mouth every 6 (six) hours as needed for moderate pain.     ICY HOT EX  Apply 1 application topically daily as needed (pain).     insulin aspart protamine- aspart (70-30) 100 UNIT/ML injection  Commonly known as:  NOVOLOG MIX 70/30  Inject 0.15 mLs (15 Units total) into the skin 2 (two) times daily with a meal.     levofloxacin 500 MG tablet  Commonly known as:  LEVAQUIN  Take 1 tablet (500 mg total) by mouth daily.        Discharge Condition: Disposition: 01-Home or Self Care   Consults: General surgery      Significant Diagnostic Studies: Dg Hip Complete Left  02/11/2014   CLINICAL DATA:  Left buttock region pain and swelling. No history of trauma  EXAM: LEFT HIP - COMPLETE 2+ VIEW  COMPARISON:  None.  FINDINGS: Frontal pelvis and lateral left hip images were obtained. No fracture or dislocation. Joint spaces appear intact. No erosive change. There appears to be some soft tissue swelling over the left hip region.  IMPRESSION: Soft tissue swelling left hip region. No fracture or dislocation. No  appreciable arthropathy.   Electronically Signed   By: Lowella Grip M.D.   On: 02/11/2014 10:46   Ct Pelvis W Contrast  02/10/2014   CLINICAL DATA:  Left buttock swelling. Question abscess. No trauma.  EXAM: CT PELVIS WITH CONTRAST  TECHNIQUE: Multidetector CT imaging of the pelvis was performed using the standard protocol following the bolus administration of intravenous contrast.  CONTRAST:  123m OMNIPAQUE IOHEXOL 300 MG/ML  SOLN  COMPARISON:  CT 07/11/2002  FINDINGS: Along the inferior medial border of the left gluteus maximus muscle there ovoid mass measuring 9.4 x 4.7x 5.5 cm. The mass has similar density to adjacent muscle. There is edema within the subcutaneous fat superficial to this mass lesion suggesting infection. The mass extends from the coccyx laterally towards the femur. Mass lesion is seen on image 39, series 2.  There is no evidence perianal fistula. There is no evidence of abscess or infection within the peritoneal retroperitoneal space with the pelvis.  Adjacent bone to this presumed infection is normal without cortical erosion.  There are several diverticula of the sigmoid colon without acute inflammation. The uterus and ovaries are normal. The bladder is intact. Abdominal aorta is partially image appears normal.  IMPRESSION: 1. Large presumed abscess along the medial border of the left gluteus  maximus muscle. 2. No evidence of intraperitoneal retroperitoneal infection. These results will be called to the ordering clinician or representative by the Radiologist Assistant, and communication documented in the PACS or zVision Dashboard.   Electronically Signed   By: Suzy Bouchard M.D.   On: 02/10/2014 16:13      Microbiology: Recent Results (from the past 240 hour(s))  Urine culture     Status: None   Collection Time: 02/10/14 12:20 PM  Result Value Ref Range Status   Culture ESCHERICHIA COLI  Final   Colony Count >=100,000 COLONIES/ML  Final   Organism ID, Bacteria  ESCHERICHIA COLI  Final      Susceptibility   Escherichia coli -  (no method available)    AMPICILLIN <=2 Sensitive     AMOX/CLAVULANIC <=2 Sensitive     AMPICILLIN/SULBACTAM <=2 Sensitive     PIP/TAZO <=4 Sensitive     IMIPENEM <=0.25 Sensitive     CEFAZOLIN <=4 Sensitive     CEFTRIAXONE <=1 Sensitive     CEFTAZIDIME <=1 Sensitive     CEFEPIME <=1 Sensitive     GENTAMICIN <=1 Sensitive     TOBRAMYCIN <=1 Sensitive     CIPROFLOXACIN <=0.25 Sensitive     LEVOFLOXACIN <=0.12 Sensitive     NITROFURANTOIN <=16 Sensitive     TRIMETH/SULFA <=20 Sensitive   MRSA PCR Screening     Status: None   Collection Time: 02/10/14 10:32 PM  Result Value Ref Range Status   MRSA by PCR NEGATIVE NEGATIVE Final    Comment:        The GeneXpert MRSA Assay (FDA approved for NASAL specimens only), is one component of a comprehensive MRSA colonization surveillance program. It is not intended to diagnose MRSA infection nor to guide or monitor treatment for MRSA infections.   Culture, blood (routine x 2)     Status: None (Preliminary result)   Collection Time: 02/10/14 10:33 PM  Result Value Ref Range Status   Specimen Description BLOOD LEFT HAND  Final   Special Requests BOTTLES DRAWN AEROBIC ONLY 5CC  Final   Culture  Setup Time   Final    02/11/2014 04:13 Performed at [facility]    Culture   Final           BLOOD CULTURE RECEIVED NO GROWTH TO DATE CULTURE WILL BE HELD FOR 5 DAYS BEFORE ISSUING A FINAL NEGATIVE REPORT Note: Culture results may be compromised due to an inadequate volume of blood received in culture bottles. Performed at [facility]    Report Status PENDING  Incomplete  Culture, blood (routine x 2)     Status: None (Preliminary result)   Collection Time: 02/10/14 10:38 PM  Result Value Ref Range Status   Specimen Description BLOOD TIGHT HAND RIGHT HAND  Final   Special Requests BOTTLES DRAWN AEROBIC ONLY 5CC  Final   Culture  Setup Time   Final     02/11/2014 04:13 Performed at [facility]    Culture   Final           BLOOD CULTURE RECEIVED NO GROWTH TO DATE CULTURE WILL BE HELD FOR 5 DAYS BEFORE ISSUING A FINAL NEGATIVE REPORT Performed at [facility]    Report Status PENDING  Incomplete  Anaerobic culture     Status: None (Preliminary result)   Collection Time: 02/11/14 12:32 PM  Result Value Ref Range Status   Specimen Description ABSCESS LEFT BUTTOCK  Final   Special Requests PATIENT ON  FOLLOWING VANCOMYCIN AND ZOSYN  Final   Gram Stain PENDING  Incomplete   Culture   Final    NO ANAEROBES ISOLATED; CULTURE IN PROGRESS FOR 5 DAYS Performed at [facility]    Report Status PENDING  Incomplete  Culture, routine-abscess     Status: None   Collection Time: 02/11/14 12:32 PM  Result Value Ref Range Status   Specimen Description ABSCESS LEFT BUTTOCK DRAINAGE  Final   Special Requests PATIENT ON FOLLOWING VANCOMYCIN AND ZOSYN  Final   Gram Stain   Final    ABUNDANT WBC PRESENT,BOTH PMN AND MONONUCLEAR NO SQUAMOUS EPITHELIAL CELLS SEEN FEW GRAM POSITIVE COCCI IN PAIRS IN CLUSTERS Performed at [facility]    Culture   Final    ABUNDANT STAPHYLOCOCCUS AUREUS Note: RIFAMPIN AND GENTAMICIN SHOULD NOT BE USED AS SINGLE DRUGS FOR TREATMENT OF STAPH INFECTIONS. This organism is presumed to be Clindamycin resistant based on detection of inducible Clindamycin resistance. Performed at [facility]    Report Status 02/13/2014 FINAL  Final   Organism ID, Bacteria STAPHYLOCOCCUS AUREUS  Final      Susceptibility   Staphylococcus aureus - MIC*    CLINDAMYCIN RESISTANT      ERYTHROMYCIN >=8 RESISTANT Resistant     GENTAMICIN <=0.5 SENSITIVE Sensitive     LEVOFLOXACIN 0.25 SENSITIVE Sensitive     OXACILLIN 0.5 SENSITIVE Sensitive     PENICILLIN >=0.5 RESISTANT Resistant     RIFAMPIN <=0.5 SENSITIVE Sensitive     TRIMETH/SULFA <=10 SENSITIVE Sensitive     VANCOMYCIN 1 SENSITIVE Sensitive      TETRACYCLINE <=1 SENSITIVE Sensitive     MOXIFLOXACIN <=0.25 SENSITIVE Sensitive     * ABUNDANT STAPHYLOCOCCUS AUREUS  AFB culture with smear     Status: None (Preliminary result)   Collection Time: 02/11/14 12:33 PM  Result Value Ref Range Status   Specimen Description ABSCESS  Final   Special Requests PATIENT ON FOLLOWING VANCOMYCIN AND ZOSYN  Final   Acid Fast Smear   Final    NO ACID FAST BACILLI SEEN Performed at [facility]    Culture   Final    CULTURE WILL BE EXAMINED FOR 6 WEEKS BEFORE ISSUING A FINAL REPORT Performed at [facility]    Report Status PENDING  Incomplete     Labs: Results for orders placed or performed during the hospital encounter of 02/10/14 (from the past 48 hour(s))  Glucose, capillary     Status: Abnormal   Collection Time: 02/12/14  4:57 PM  Result Value Ref Range   Glucose-Capillary 165 (H) 70 - 99 mg/dL  Glucose, capillary     Status: Abnormal   Collection Time: 02/12/14  9:51 PM  Result Value Ref Range   Glucose-Capillary 290 (H) 70 - 99 mg/dL   Comment 1 Notify RN   CBC     Status: Abnormal   Collection Time: 02/13/14  4:41 AM  Result Value Ref Range   WBC 7.7 4.0 - 10.5 K/uL   RBC 3.56 (L) 3.87 - 5.11 MIL/uL   Hemoglobin 10.5 (L) 12.0 - 15.0 g/dL   HCT 32.6 (L) 36.0 - 46.0 %   MCV 91.6 78.0 - 100.0 fL   MCH 29.5 26.0 - 34.0 pg   MCHC 32.2 30.0 - 36.0 g/dL   RDW 13.3 11.5 - 15.5 %   Platelets 269 150 - 400 K/uL  Comprehensive metabolic panel     Status: Abnormal   Collection Time: 02/13/14  5:00 AM  Result Value Ref Range   Sodium 139 135 - 145 mmol/L    Comment: Please note change in reference range.   Potassium 4.3 3.5 - 5.1 mmol/L    Comment: Please note change in reference range. DELTA CHECK NOTED    Chloride 111 96 - 112 mEq/L   CO2 21 19 - 32 mmol/L   Glucose, Bld 225 (H) 70 - 99 mg/dL   BUN 18 6 - 23 mg/dL   Creatinine, Ser 0.68 0.50 - 1.10 mg/dL   Calcium 7.9 (L) 8.4 - 10.5 mg/dL   Total Protein 5.8  (L) 6.0 - 8.3 g/dL   Albumin 2.0 (L) 3.5 - 5.2 g/dL   AST 17 0 - 37 U/L   ALT 11 0 - 35 U/L   Alkaline Phosphatase 408 (H) 39 - 117 U/L   Total Bilirubin 0.4 0.3 - 1.2 mg/dL   GFR calc non Af Amer 90 (L) >90 mL/min   GFR calc Af Amer >90 >90 mL/min    Comment: (NOTE) The eGFR has been calculated using the CKD EPI equation. This calculation has not been validated in all clinical situations. eGFR's persistently <90 mL/min signify possible Chronic Kidney Disease.    Anion gap 7 5 - 15  Glucose, capillary     Status: Abnormal   Collection Time: 02/13/14  7:39 AM  Result Value Ref Range   Glucose-Capillary 152 (H) 70 - 99 mg/dL  Glucose, capillary     Status: Abnormal   Collection Time: 02/13/14 12:12 PM  Result Value Ref Range   Glucose-Capillary 171 (H) 70 - 99 mg/dL  Glucose, capillary     Status: Abnormal   Collection Time: 02/13/14  4:48 PM  Result Value Ref Range   Glucose-Capillary 159 (H) 70 - 99 mg/dL  Glucose, capillary     Status: Abnormal   Collection Time: 02/13/14  9:44 PM  Result Value Ref Range   Glucose-Capillary 139 (H) 70 - 99 mg/dL  Basic metabolic panel     Status: Abnormal   Collection Time: 02/14/14  5:18 AM  Result Value Ref Range   Sodium 138 135 - 145 mmol/L    Comment: Please note change in reference range.   Potassium 4.6 3.5 - 5.1 mmol/L    Comment: Please note change in reference range.   Chloride 114 (H) 96 - 112 mEq/L   CO2 21 19 - 32 mmol/L   Glucose, Bld 201 (H) 70 - 99 mg/dL   BUN 11 6 - 23 mg/dL   Creatinine, Ser 0.52 0.50 - 1.10 mg/dL   Calcium 8.6 8.4 - 10.5 mg/dL   GFR calc non Af Amer >90 >90 mL/min   GFR calc Af Amer >90 >90 mL/min    Comment: (NOTE) The eGFR has been calculated using the CKD EPI equation. This calculation has not been validated in all clinical situations. eGFR's persistently <90 mL/min signify possible Chronic Kidney Disease.    Anion gap 3 (L) 5 - 15  Glucose, capillary     Status: Abnormal   Collection Time:  02/14/14  7:48 AM  Result Value Ref Range   Glucose-Capillary 168 (H) 70 - 99 mg/dL  Glucose, capillary     Status: Abnormal   Collection Time: 02/14/14 11:39 AM  Result Value Ref Range   Glucose-Capillary 187 (H) 70 - 99 mg/dL       HPI :Bailey [name] is a 63 y.o. female with past history of diabetes type 2, noncompliant medications,  who presents with left buttock pain.  Patient reports that she started having left buttock pain for approximately one week, which has been progressively getting worse. Patient saw an urgent care physician today and had CT ordered which demonstrated a deep large gluteal abscess on the left. Patient feeling cold and has chills. She denies headaches, cough, chest pain, SOB, abdominal pain, diarrhea, constipation, hematuria, skin rashes, joint pain or leg swelling.  Work up in the ED demonstrates tachycardia with heart rates while 2, leukocytosis with WBC 11.9, trace amount of leukocytes in urine, positive ketone in urine. Potassium 3.6, elevated anion gap of 21. Patient is admitted to inpatient for further evaluation and treatment.   HOSPITAL COURSE:  Buttock Abscess Initially treated with broad-spectrum antibiotics, Culture grew out MSSA from left gluteal abscess, status post incision and drainage Discontinue vanc/zosyn , switched to Levaquin for 10 days Urine culture shows Escherichia coli, wound culture shows staph aureus, both sensitive to Levaquin General surgery recommends follow-up with Dr. Zella Richer in one week for drain management. she would need to change the bandage with 4 x 4's and tape twice a day  DKA, CBG improving -GAP closed and bicarb 21 Discontinued Levemir, transition to insulin 70/30, 15 units twice a day Patient provided with a prescription for Novolin 70/30 (ReliOn brand) at [facility]. Has meter and supplies at home. Will need f/u appt to PCP who will be back in January. Have discussed hypoglycemia s/s and treatment, importance  of spacing meals and eating moderate portion sizes with limited simple CHOs. Ordered OP Diabetes Education consult [facility] will call pt for appt.   Discharge Exam: Blood pressure 117/66, pulse 69, temperature 97.8 F (36.6 C), temperature source Oral, resp. rate 18, height 5' 3" (1.6 m), weight 74.2 kg (163 lb 9.3 oz), SpO2 100 %.  General appearance: Alert. No distress. Cooperative. GI: soft, non-tender; bowel sounds normal; no masses, no organomegaly Skin: Left gluteal wound clean. No cellulitis. No bleeding. Minimal thin drainage. No odor. No necrosis. Penrose drain advanced 1 events and resecured with safety pin today       Discharge Instructions    Ambulatory referral to Nutrition and Diabetic Education    Complete by:  As directed      Diet - low sodium heart healthy    Complete by:  As directed      Increase activity slowly    Complete by:  As directed            Follow-up Information    Follow up with [facility] On 02/20/2014.   Why:  Be at the office at 1:45 PM for check in your appointment is for 2:15 PM.  You are the 4th person double booked so you may be a little later, but show up and get checked in on time.   Contact information:   [address] [place]   [place] [address] [phone]       Signed: Reyne Dumas 02/14/2014, 12:10 PM

## 2014-02-14 NOTE — Progress Notes (Signed)
Dr. Zella Richer is not in the clinic next week so I have double booked her to see [place] in the office next Tuesday in the [place] clinic.  Info and care instructions are in the AVS.

## 2014-02-14 NOTE — Progress Notes (Signed)
3 Days Post-Op  Subjective: Patient is doing well. Spirits good. Denies pain. Currently on Levaquin for MSS a left gluteal abscess and Escherichia coli UTI. Next line the patient is asking to go home. Afebrile.  Objective: Vital signs in last 24 hours: Temp:  [98.1 F (36.7 C)-98.5 F (36.9 C)] 98.2 F (36.8 C) (12/22 2100) Pulse Rate:  [73-83] 78 (12/22 2100) Resp:  [20] 20 (12/22 2100) BP: (128-143)/(61-71) 143/61 mmHg (12/22 2100) SpO2:  [96 %-100 %] 96 % (12/22 2100) Last BM Date: 02/13/14  Intake/Output from previous day: 12/22 0701 - 12/23 0700 In: 600 [P.O.:600] Out: -  Intake/Output this shift:    General appearance: Alert. No distress. Cooperative. GI: soft, non-tender; bowel sounds normal; no masses,  no organomegaly Skin:  Left gluteal wound clean. No cellulitis. No bleeding. Minimal thin drainage. No odor. No necrosis. Penrose drain advanced 1 events and resecured with safety pin today.  Lab Results:  Results for orders placed or performed during the hospital encounter of 02/10/14 (from the past 24 hour(s))  Glucose, capillary     Status: Abnormal   Collection Time: 02/13/14  7:39 AM  Result Value Ref Range   Glucose-Capillary 152 (H) 70 - 99 mg/dL  Glucose, capillary     Status: Abnormal   Collection Time: 02/13/14 12:12 PM  Result Value Ref Range   Glucose-Capillary 171 (H) 70 - 99 mg/dL  Glucose, capillary     Status: Abnormal   Collection Time: 02/13/14  4:48 PM  Result Value Ref Range   Glucose-Capillary 159 (H) 70 - 99 mg/dL  Glucose, capillary     Status: Abnormal   Collection Time: 02/13/14  9:44 PM  Result Value Ref Range   Glucose-Capillary 139 (H) 70 - 99 mg/dL     Studies/Results: No results found.  Marland Kitchen antiseptic oral rinse  7 mL Mouth Rinse BID  . docusate sodium  100 mg Oral BID  . Gerhardt's butt cream   Topical BID  . heparin  5,000 Units Subcutaneous 3 times per day  . insulin aspart  0-9 Units Subcutaneous TID WC  . insulin aspart  protamine- aspart  10 Units Subcutaneous BID WC  . levofloxacin  500 mg Oral Daily  . miconazole  1 Applicatorful Vaginal QHS  . sodium chloride  1,000 mL Intravenous Once     Assessment/Plan: s/p Procedure(s): INCISION AND DRAINAGE ABSCESS Left Buttock  MSSA abscess left gluteal area. Appears adequately drained.   patient could be discharged on 10 day course of Levaquin, if discharged could follow-up with Dr. Zella Richer  in one week for drain management.  she would need to change the bandage with 4 x 4's and tape twice a day Will discuss with you.  Escherichia coli UTI. Also sensitive to Levaquin.  DKA. Resolved. CBGs ranging from 139 to 290. Needs careful internal medicine and/or endocrinology follow-up once discharged.  $RemoveBefo'@PROBHOSP'[id number]$ @  LOS: 4 days    Lord Lancour M 02/14/2014  . .prob

## 2014-02-14 NOTE — Discharge Instructions (Signed)
Peri-Rectal Abscess Your caregiver has diagnosed you as having a peri-rectal abscess. This is an infected area near the rectum that is filled with pus. If the abscess is near the surface of the skin, your caregiver may open (incise) the area and drain the pus. HOME CARE INSTRUCTIONS   If your abscess was opened up and drained. A small piece of gauze may be placed in the opening so that it can drain. Do not remove the gauze unless directed by your caregiver.  You can shower and get soap and water in this site.  Redress after shower.  A loose dressing may be placed over the abscess site. Change the dressing as often as necessary to keep it clean and dry.  After the drain is removed, the area may be washed with a gentle antiseptic (soap) four times per day.  A warm sitz bath, warm packs or heating pad may be used for pain relief, taking care not to burn yourself.  Return for a wound check in 1 day or as directed.  An "inflatable doughnut" may be used for sitting with added comfort. These can be purchased at a drugstore or medical supply house.  To reduce pain and straining with bowel movements, eat a high fiber diet with plenty of fruits and vegetables. Use stool softeners as recommended by your caregiver. This is especially important if narcotic type pain medications were prescribed as these may cause marked constipation.  Only take over-the-counter or prescription medicines for pain, discomfort, or fever as directed by your caregiver. SEEK IMMEDIATE MEDICAL CARE IF:   You have increasing pain that is not controlled by medication.  There is increased inflammation (redness), swelling, bleeding, or drainage from the area.  An oral temperature above 102 F (38.9 C) develops.  You develop chills or generalized malaise (feel lethargic or feel "washed out").  You develop any new symptoms (problems) you feel may be related to your present problem. Document Released: 02/07/2000 Document Revised:  05/04/2011 Document Reviewed: 02/07/2008 [facility] Patient Information 2015 [place], [place]. This information is not intended to replace advice given to you by your health care provider. Make sure you discuss any questions you have with your health care provider.   bandage with 4 x 4's and tape twice a day

## 2014-02-14 NOTE — Progress Notes (Signed)
Occupational Therapy Treatment Patient Details Name: Bailey [name] MRN: [id number] DOB: 07/26/1950 Today's Date: 02/14/2014    History of present illness Pt is a 63 year old female s/p I & D for L gluteal abscess 12/20.   OT comments  Pt ready to go home from OT standpoint. She is overall S with ADL's working toward I  Follow Up Recommendations  Supervision - Intermittent;No OT follow up    Equipment Recommendations  None recommended by OT    Recommendations for Other Services      Precautions / Restrictions         Mobility Bed Mobility Overal bed mobility: Independent                Transfers Overall transfer level: Needs assistance     Sit to Stand: Supervision              Balance                                   ADL Overall ADL's : Needs assistance/impaired Eating/Feeding: Independent;Sitting   Grooming: Sitting;Set up   Upper Body Bathing: Set up;Sitting   Lower Body Bathing: Set up;Sit to/from stand   Upper Body Dressing : Set up;Sitting   Lower Body Dressing: Set up;Sit to/from stand   Toilet Transfer: Supervision/safety;Ambulation;Regular Toilet   Toileting- Water quality scientist and Hygiene: Supervision/safety;Sit to/from stand       Functional mobility during ADLs: Set up        Vision                     Perception     Praxis      Cognition   Behavior During Therapy: [facility] for tasks assessed/performed Overall Cognitive Status: Within Functional Limits for tasks assessed                       Extremity/Trunk Assessment               Exercises     Shoulder Instructions       General Comments      Pertinent Vitals/ Pain       Pain Assessment: No/denies pain  Home Living                                          Prior Functioning/Environment              Frequency       Progress Toward Goals  OT Goals(current goals can now be found in the care  plan section)  Progress towards OT goals: Progressing toward goals     Plan Discharge plan remains appropriate    Co-evaluation                 End of Session     Activity Tolerance Patient tolerated treatment well   Patient Left in chair;with call bell/phone within reach   Nurse Communication Mobility status        Time: 9480-1655 OT Time Calculation (min): 24 min  Charges: OT General Charges $OT Visit: 1 Procedure OT Treatments $Self Care/Home Management : 23-37 mins  [name] 02/14/2014, 10:06 AM

## 2014-02-16 ENCOUNTER — Telehealth (INDEPENDENT_AMBULATORY_CARE_PROVIDER_SITE_OTHER): Payer: Self-pay | Admitting: Surgery

## 2014-02-16 LAB — ANAEROBIC CULTURE

## 2014-02-16 NOTE — Telephone Encounter (Signed)
Bailey [name]  06-07-1950 [id number]  Patient Care Team: No Pcp Per Patient as PCP - General (General Practice)  This patient is a 63 y.o.female who calls today for surgical evaluation.   02/11/2014  PREOPERATIVE DX: Left buttock abscess  POSTOPERATIVE DX: Same  PROCEDURE: Complex incision and drainage of left buttock abscess     Surgeon: Odis Hollingshead    Reason for call:   S/p I&D abscess drainage.  D/c'd 2 days ago.  Husband called concerned that the penrose tube in the wound has partially moved out.  Wound draining well & pain / swelling is less.  Continues w Levaquin PO ABx.  Since the wound is draining & she is feeling better, continue the Tx plan & keep appt in 4 days.  I updated the status of the patient to the patient's husband.  I made recommendations.  I answered questions.  Understanding & appreciation was expressed.   Patient Active Problem List   Diagnosis Date Noted  . DKA (diabetic ketoacidoses) 02/10/2014  . Abscess of buttock, left 02/10/2014  . Sepsis 02/10/2014  . Diabetes mellitus without complication     Past Medical History  Diagnosis Date  . Diabetes mellitus     Past Surgical History  Procedure Laterality Date  . Toe amputation      left foot great toe  . Incision and drainage abscess Left 02/11/2014    Procedure: INCISION AND DRAINAGE ABSCESS Left Buttock;  Surgeon: Jackolyn Confer, MD;  Location: [facility];  Service: General;  Laterality: Left;    History   Social History  . Marital Status: Married    Spouse Name: N/A    Number of Children: N/A  . Years of Education: N/A   Occupational History  . Not on file.   Social History Main Topics  . Smoking status: Never Smoker   . Smokeless tobacco: Never Used  . Alcohol Use: No  . Drug Use: No  . Sexual Activity: Not on file   Other Topics Concern  . Not on file   Social History Narrative    Family History  Problem Relation Age of Onset  . Diabetes Mother   .  Asthma Sister     Current Outpatient Prescriptions  Medication Sig Dispense Refill  . acetaminophen (TYLENOL) 500 MG tablet Take 500 mg by mouth every 6 (six) hours as needed for moderate pain (pain).    Marland Kitchen docusate sodium 100 MG CAPS Take 100 mg by mouth 2 (two) times daily. 10 capsule 0  . HYDROcodone-acetaminophen (NORCO) 5-325 MG per tablet Take 1 tablet by mouth every 6 (six) hours as needed for moderate pain. 30 tablet 0  . insulin aspart protamine- aspart (NOVOLOG MIX 70/30) (70-30) 100 UNIT/ML injection Inject 0.15 mLs (15 Units total) into the skin 2 (two) times daily with a meal. 10 mL 11  . levofloxacin (LEVAQUIN) 500 MG tablet Take 1 tablet (500 mg total) by mouth daily. 10 tablet 0  . Menthol, Topical Analgesic, (ICY HOT EX) Apply 1 application topically daily as needed (pain).     No current facility-administered medications for this visit.     No Known Allergies  $RemoveBe'@VS'gYixpvFgn$ @  Dg Hip Complete Left  02/11/2014   CLINICAL DATA:  Left buttock region pain and swelling. No history of trauma  EXAM: LEFT HIP - COMPLETE 2+ VIEW  COMPARISON:  None.  FINDINGS: Frontal pelvis and lateral left hip images were obtained. No fracture or dislocation. Joint spaces appear intact. No erosive change. There  appears to be some soft tissue swelling over the left hip region.  IMPRESSION: Soft tissue swelling left hip region. No fracture or dislocation. No appreciable arthropathy.   Electronically Signed   By: Lowella Grip M.D.   On: 02/11/2014 10:46   Ct Pelvis W Contrast  02/10/2014   CLINICAL DATA:  Left buttock swelling. Question abscess. No trauma.  EXAM: CT PELVIS WITH CONTRAST  TECHNIQUE: Multidetector CT imaging of the pelvis was performed using the standard protocol following the bolus administration of intravenous contrast.  CONTRAST:  114mL OMNIPAQUE IOHEXOL 300 MG/ML  SOLN  COMPARISON:  CT 07/11/2002  FINDINGS: Along the inferior medial border of the left gluteus maximus muscle there ovoid  mass measuring 9.4 x 4.7x 5.5 cm. The mass has similar density to adjacent muscle. There is edema within the subcutaneous fat superficial to this mass lesion suggesting infection. The mass extends from the coccyx laterally towards the femur. Mass lesion is seen on image 39, series 2.  There is no evidence perianal fistula. There is no evidence of abscess or infection within the peritoneal retroperitoneal space with the pelvis.  Adjacent bone to this presumed infection is normal without cortical erosion.  There are several diverticula of the sigmoid colon without acute inflammation. The uterus and ovaries are normal. The bladder is intact. Abdominal aorta is partially image appears normal.  IMPRESSION: 1. Large presumed abscess along the medial border of the left gluteus maximus muscle. 2. No evidence of intraperitoneal retroperitoneal infection. These results will be called to the ordering clinician or representative by the Radiologist Assistant, and communication documented in the PACS or zVision Dashboard.   Electronically Signed   By: Suzy Bouchard M.D.   On: 02/10/2014 16:13    Note: This dictation was prepared with Dragon/digital dictation along with [employer]. Any transcriptional errors that result from this process are unintentional.

## 2014-02-17 LAB — CULTURE, BLOOD (ROUTINE X 2)
CULTURE: NO GROWTH
Culture: NO GROWTH

## 2014-03-02 DIAGNOSIS — E663 Overweight: Secondary | ICD-10-CM | POA: Diagnosis not present

## 2014-03-02 DIAGNOSIS — L0291 Cutaneous abscess, unspecified: Secondary | ICD-10-CM | POA: Diagnosis not present

## 2014-03-02 DIAGNOSIS — E119 Type 2 diabetes mellitus without complications: Secondary | ICD-10-CM | POA: Diagnosis not present

## 2014-03-06 DIAGNOSIS — K611 Rectal abscess: Secondary | ICD-10-CM | POA: Diagnosis not present

## 2014-03-26 DIAGNOSIS — L0291 Cutaneous abscess, unspecified: Secondary | ICD-10-CM | POA: Diagnosis not present

## 2014-03-26 DIAGNOSIS — E663 Overweight: Secondary | ICD-10-CM | POA: Diagnosis not present

## 2014-03-26 DIAGNOSIS — E119 Type 2 diabetes mellitus without complications: Secondary | ICD-10-CM | POA: Diagnosis not present

## 2014-04-01 LAB — AFB CULTURE WITH SMEAR (NOT AT ARMC): Acid Fast Smear: NONE SEEN

## 2014-04-19 DIAGNOSIS — I10 Essential (primary) hypertension: Secondary | ICD-10-CM | POA: Diagnosis not present

## 2014-04-19 DIAGNOSIS — N390 Urinary tract infection, site not specified: Secondary | ICD-10-CM | POA: Diagnosis not present

## 2014-05-03 DIAGNOSIS — M545 Low back pain: Secondary | ICD-10-CM | POA: Diagnosis not present

## 2014-05-03 DIAGNOSIS — E663 Overweight: Secondary | ICD-10-CM | POA: Diagnosis not present

## 2014-05-03 DIAGNOSIS — I10 Essential (primary) hypertension: Secondary | ICD-10-CM | POA: Diagnosis not present

## 2014-05-03 DIAGNOSIS — E1142 Type 2 diabetes mellitus with diabetic polyneuropathy: Secondary | ICD-10-CM | POA: Diagnosis not present

## 2014-05-28 DIAGNOSIS — M545 Low back pain: Secondary | ICD-10-CM | POA: Diagnosis not present

## 2014-05-28 DIAGNOSIS — E1142 Type 2 diabetes mellitus with diabetic polyneuropathy: Secondary | ICD-10-CM | POA: Diagnosis not present

## 2014-05-28 DIAGNOSIS — E663 Overweight: Secondary | ICD-10-CM | POA: Diagnosis not present

## 2014-05-28 DIAGNOSIS — Z23 Encounter for immunization: Secondary | ICD-10-CM | POA: Diagnosis not present

## 2014-06-18 DIAGNOSIS — E1142 Type 2 diabetes mellitus with diabetic polyneuropathy: Secondary | ICD-10-CM | POA: Diagnosis not present

## 2014-06-18 DIAGNOSIS — E663 Overweight: Secondary | ICD-10-CM | POA: Diagnosis not present

## 2014-06-18 DIAGNOSIS — K219 Gastro-esophageal reflux disease without esophagitis: Secondary | ICD-10-CM | POA: Diagnosis not present

## 2016-02-24 HISTORY — PX: CATARACT EXTRACTION: SUR2

## 2016-04-23 DIAGNOSIS — N179 Acute kidney failure, unspecified: Secondary | ICD-10-CM

## 2016-04-23 HISTORY — DX: Acute kidney failure, unspecified: N17.9

## 2016-05-05 ENCOUNTER — Inpatient Hospital Stay (HOSPITAL_COMMUNITY)
Admission: EM | Admit: 2016-05-05 | Discharge: 2016-05-16 | DRG: 348 | Disposition: A | Discharge status: Home / Self Care | Payer: Medicare HMO | Attending: Internal Medicine | Admitting: Internal Medicine

## 2016-05-05 ENCOUNTER — Encounter (HOSPITAL_COMMUNITY): Payer: Self-pay | Admitting: Emergency Medicine

## 2016-05-05 ENCOUNTER — Emergency Department (HOSPITAL_COMMUNITY): Payer: Medicare HMO

## 2016-05-05 DIAGNOSIS — K5712 Diverticulitis of small intestine without perforation or abscess without bleeding: Secondary | ICD-10-CM | POA: Diagnosis not present

## 2016-05-05 DIAGNOSIS — N179 Acute kidney failure, unspecified: Secondary | ICD-10-CM | POA: Diagnosis not present

## 2016-05-05 DIAGNOSIS — E1169 Type 2 diabetes mellitus with other specified complication: Secondary | ICD-10-CM | POA: Diagnosis not present

## 2016-05-05 DIAGNOSIS — D649 Anemia, unspecified: Secondary | ICD-10-CM | POA: Diagnosis present

## 2016-05-05 DIAGNOSIS — R188 Other ascites: Secondary | ICD-10-CM | POA: Diagnosis not present

## 2016-05-05 DIAGNOSIS — Z794 Long term (current) use of insulin: Secondary | ICD-10-CM | POA: Diagnosis not present

## 2016-05-05 DIAGNOSIS — K56609 Unspecified intestinal obstruction, unspecified as to partial versus complete obstruction: Secondary | ICD-10-CM

## 2016-05-05 DIAGNOSIS — K631 Perforation of intestine (nontraumatic): Secondary | ICD-10-CM

## 2016-05-05 DIAGNOSIS — R109 Unspecified abdominal pain: Secondary | ICD-10-CM | POA: Diagnosis not present

## 2016-05-05 DIAGNOSIS — R319 Hematuria, unspecified: Secondary | ICD-10-CM | POA: Diagnosis not present

## 2016-05-05 DIAGNOSIS — E860 Dehydration: Secondary | ICD-10-CM | POA: Diagnosis not present

## 2016-05-05 DIAGNOSIS — M7989 Other specified soft tissue disorders: Secondary | ICD-10-CM | POA: Diagnosis not present

## 2016-05-05 DIAGNOSIS — K565 Intestinal adhesions [bands], unspecified as to partial versus complete obstruction: Secondary | ICD-10-CM | POA: Diagnosis not present

## 2016-05-05 DIAGNOSIS — K5730 Diverticulosis of large intestine without perforation or abscess without bleeding: Secondary | ICD-10-CM | POA: Diagnosis not present

## 2016-05-05 DIAGNOSIS — N390 Urinary tract infection, site not specified: Secondary | ICD-10-CM | POA: Diagnosis not present

## 2016-05-05 DIAGNOSIS — E119 Type 2 diabetes mellitus without complications: Secondary | ICD-10-CM | POA: Diagnosis not present

## 2016-05-05 DIAGNOSIS — E861 Hypovolemia: Secondary | ICD-10-CM | POA: Diagnosis present

## 2016-05-05 DIAGNOSIS — R198 Other specified symptoms and signs involving the digestive system and abdomen: Secondary | ICD-10-CM

## 2016-05-05 DIAGNOSIS — E1165 Type 2 diabetes mellitus with hyperglycemia: Secondary | ICD-10-CM | POA: Insufficient documentation

## 2016-05-05 DIAGNOSIS — K567 Ileus, unspecified: Secondary | ICD-10-CM | POA: Diagnosis present

## 2016-05-05 DIAGNOSIS — K6389 Other specified diseases of intestine: Secondary | ICD-10-CM | POA: Diagnosis not present

## 2016-05-05 DIAGNOSIS — E118 Type 2 diabetes mellitus with unspecified complications: Secondary | ICD-10-CM

## 2016-05-05 DIAGNOSIS — K5710 Diverticulosis of small intestine without perforation or abscess without bleeding: Secondary | ICD-10-CM

## 2016-05-05 DIAGNOSIS — K529 Noninfective gastroenteritis and colitis, unspecified: Secondary | ICD-10-CM | POA: Diagnosis not present

## 2016-05-05 DIAGNOSIS — E876 Hypokalemia: Secondary | ICD-10-CM | POA: Diagnosis not present

## 2016-05-05 DIAGNOSIS — K561 Intussusception: Secondary | ICD-10-CM | POA: Diagnosis not present

## 2016-05-05 DIAGNOSIS — Z79899 Other long term (current) drug therapy: Secondary | ICD-10-CM | POA: Diagnosis not present

## 2016-05-05 DIAGNOSIS — K469 Unspecified abdominal hernia without obstruction or gangrene: Secondary | ICD-10-CM | POA: Diagnosis present

## 2016-05-05 DIAGNOSIS — IMO0002 Reserved for concepts with insufficient information to code with codable children: Secondary | ICD-10-CM

## 2016-05-05 LAB — COMPREHENSIVE METABOLIC PANEL
ALBUMIN: 3.3 g/dL — AB (ref 3.5–5.0)
ALT: 11 U/L — ABNORMAL LOW (ref 14–54)
ANION GAP: 7 (ref 5–15)
AST: 11 U/L — ABNORMAL LOW (ref 15–41)
Alkaline Phosphatase: 115 U/L (ref 38–126)
BUN: 27 mg/dL — ABNORMAL HIGH (ref 6–20)
CHLORIDE: 111 mmol/L (ref 101–111)
CO2: 19 mmol/L — AB (ref 22–32)
Calcium: 9 mg/dL (ref 8.9–10.3)
Creatinine, Ser: 0.95 mg/dL (ref 0.44–1.00)
GFR calc Af Amer: >60 mL/min (ref >60)
GFR calc non Af Amer: >60 mL/min (ref >60)
GLUCOSE: 298 mg/dL — AB (ref 65–99)
Potassium: 4.5 mmol/L (ref 3.5–5.1)
SODIUM: 137 mmol/L (ref 135–145)
TOTAL PROTEIN: 6.9 g/dL (ref 6.5–8.1)
Total Bilirubin: 0.7 mg/dL (ref 0.3–1.2)

## 2016-05-05 LAB — URINALYSIS, ROUTINE W REFLEX MICROSCOPIC
Bilirubin Urine: NEGATIVE
Glucose, UA: >=500 mg/dL — AB
KETONES UR: 5 mg/dL — AB
Nitrite: NEGATIVE
PROTEIN: 100 mg/dL — AB
Specific Gravity, Urine: 1.028 (ref 1.005–1.030)
pH: 5 (ref 5.0–8.0)

## 2016-05-05 LAB — LIPASE, BLOOD: LIPASE: 15 U/L (ref 11–51)

## 2016-05-05 LAB — CBC
HEMATOCRIT: 31.9 % — AB (ref 36.0–46.0)
Hemoglobin: 10.5 g/dL — ABNORMAL LOW (ref 12.0–15.0)
MCH: 28.2 pg (ref 26.0–34.0)
MCHC: 32.9 g/dL (ref 30.0–36.0)
MCV: 85.5 fL (ref 78.0–100.0)
Platelets: 189 10*3/uL (ref 150–400)
RBC: 3.73 MIL/uL — ABNORMAL LOW (ref 3.87–5.11)
RDW: 13.1 % (ref 11.5–15.5)
WBC: 10.3 10*3/uL (ref 4.0–10.5)

## 2016-05-05 MED ORDER — ONDANSETRON HCL 4 MG PO TABS: 4.0000 mg | ORAL_TABLET | Freq: Four times a day (QID) | ORAL | Status: DC | PRN

## 2016-05-05 MED ORDER — INSULIN ASPART 100 UNIT/ML ~~LOC~~ SOLN
0.0000 [IU] | SUBCUTANEOUS | Status: DC
  Administered 2016-05-06: 5 [IU] via SUBCUTANEOUS
  Administered 2016-05-06: 3 [IU] via SUBCUTANEOUS
  Administered 2016-05-06 – 2016-05-09 (×3): 2 [IU] via SUBCUTANEOUS
  Administered 2016-05-09: 3 [IU] via SUBCUTANEOUS
  Administered 2016-05-09 – 2016-05-10 (×2): 2 [IU] via SUBCUTANEOUS
  Administered 2016-05-10: 1 [IU] via SUBCUTANEOUS
  Administered 2016-05-13 (×2): 2 [IU] via SUBCUTANEOUS
  Administered 2016-05-13: 3 [IU] via SUBCUTANEOUS
  Administered 2016-05-14 (×3): 2 [IU] via SUBCUTANEOUS

## 2016-05-05 MED ORDER — PIPERACILLIN-TAZOBACTAM 3.375 G IVPB 30 MIN
3.3750 g | Freq: Once | INTRAVENOUS | Status: AC
  Administered 2016-05-05: 3.375 g via INTRAVENOUS
  Filled 2016-05-05: qty 50

## 2016-05-05 MED ORDER — ENOXAPARIN SODIUM 40 MG/0.4ML ~~LOC~~ SOLN
40.0000 mg | Freq: Every day | SUBCUTANEOUS | Status: DC
  Administered 2016-05-06 – 2016-05-09 (×5): 40 mg via SUBCUTANEOUS
  Filled 2016-05-05 (×7): qty 0.4

## 2016-05-05 MED ORDER — IOPAMIDOL (ISOVUE-300) INJECTION 61%
100.0000 mL | Freq: Once | INTRAVENOUS | Status: AC | PRN
  Administered 2016-05-05: 100 mL via INTRAVENOUS

## 2016-05-05 MED ORDER — IOPAMIDOL (ISOVUE-300) INJECTION 61%
INTRAVENOUS | Status: AC
Start: 2016-05-05 — End: 2016-05-06
  Filled 2016-05-05: qty 100

## 2016-05-05 MED ORDER — SODIUM CHLORIDE 0.9 % IV SOLN
INTRAVENOUS | Status: AC
  Administered 2016-05-06 (×2): via INTRAVENOUS

## 2016-05-05 MED ORDER — SODIUM CHLORIDE 0.9 % IV BOLUS (SEPSIS)
1000.0000 mL | Freq: Once | INTRAVENOUS | Status: AC
  Administered 2016-05-05: 1000 mL via INTRAVENOUS

## 2016-05-05 MED ORDER — MORPHINE SULFATE (PF) 4 MG/ML IV SOLN
1.0000 mg | INTRAVENOUS | Status: DC | PRN
  Administered 2016-05-06: 1 mg via INTRAVENOUS
  Filled 2016-05-05 (×2): qty 1

## 2016-05-05 MED ORDER — ONDANSETRON HCL 4 MG/2ML IJ SOLN
4.0000 mg | Freq: Four times a day (QID) | INTRAMUSCULAR | Status: DC | PRN
  Administered 2016-05-06 – 2016-05-08 (×3): 4 mg via INTRAVENOUS
  Filled 2016-05-05 (×3): qty 2

## 2016-05-05 MED ORDER — INSULIN ASPART 100 UNIT/ML ~~LOC~~ SOLN: 7.0000 [IU] | Freq: Once | SUBCUTANEOUS | Status: DC

## 2016-05-05 NOTE — H&P (Signed)
History and Physical    Bailey [name] KGM:[id number] DOB: 22-Jan-1951 DOA: 05/05/2016  PCP: No PCP Per Patient   Patient coming from: Home  Chief Complaint: Severe abdominal pain   HPI: Bailey [name] is a 66 y.o. female with medical history significant for type 2 diabetes mellitus and chronic normocytic anemia who presents to the emergency department for evaluation of acute abdominal pain. Patient reports going without her diabetes medications for the past several months due to financial constraints, but states that she has remained well over this interval. She woke in her usual state of health and was having an unremarkable day until the early afternoon when she developed acute onset of severe abdominal pain, described as sharp, localized to the mid abdomen, constant, without alleviating or exacerbating factors identified, and unlike pain she had experienced previously. There has been no recent fevers or chills and no significant nausea, vomiting, or diarrhea. She denies any melena or hematochezia. With her pain unrelenting, she was brought into the ED for evaluation. She did not attempt any interventions prior to coming in.   ED Course: Upon arrival to the ED, patient is found to be afebrile, saturating well on room air, mildly hypertensive, and with vitals otherwise stable. Chemistry panel is notable for an elevated BUN to creatinine ratio and serum glucose of 298. CBC features a stable normocytic anemia with hemoglobin 10.5. CT of the abdomen and pelvis reveals an air-fluid collection adjacent to a loop of small bowel consistent with a contained perforation in the mid abdomen, adjacent to changes consistent with intermittent intussusception. There is no evidence for obstruction on CT. Gen. surgery was consulted by the ED physician and has evaluated the patient in the emergency department. A medical admission was advised for a trial of conservative management with antibiotics and bowel rest. Patient  remained hemodynamically stable in the ED. She was treated with 2 L normal saline and empiric Zosyn. She will be admitted to the medical-surgical unit for ongoing evaluation and management of a contained small bowel perforation.  Review of Systems:  All other systems reviewed and apart from HPI, are negative.  Past Medical History:  Diagnosis Date  . Diabetes mellitus     Past Surgical History:  Procedure Laterality Date  . INCISION AND DRAINAGE ABSCESS Left 02/11/2014   Procedure: INCISION AND DRAINAGE ABSCESS Left Buttock;  Surgeon: Jackolyn Confer, MD;  Location: [facility];  Service: General;  Laterality: Left;  . TOE AMPUTATION     left foot great toe     reports that she has never smoked. She has never used smokeless tobacco. She reports that she does not drink alcohol or use drugs.  No Known Allergies  Family History  Problem Relation Age of Onset  . Diabetes Mother   . Asthma Sister      Prior to Admission medications   Medication Sig Start Date End Date Taking? Authorizing Provider  ibuprofen (ADVIL,MOTRIN) 200 MG tablet Take 400 mg by mouth every 6 (six) hours as needed for moderate pain.   Yes Historical Provider, MD  acetaminophen (TYLENOL) 500 MG tablet Take 500 mg by mouth every 6 (six) hours as needed for moderate pain (pain).    Historical Provider, MD  insulin aspart protamine- aspart (NOVOLOG MIX 70/30) (70-30) 100 UNIT/ML injection Inject 0.15 mLs (15 Units total) into the skin 2 (two) times daily with a meal. Patient not taking: Reported on 05/05/2016 02/14/14   Reyne Dumas, MD  Menthol, Topical Analgesic, (ICY HOT EX)  Apply 1 application topically daily as needed (pain).    Historical Provider, MD    Physical Exam: Vitals:   05/05/16 1744 05/05/16 2100 05/05/16 2219  BP: 170/73 150/68   Pulse: 89 87   Resp: 18 18   Temp: 98.3 F (36.8 C) 98.2 F (36.8 C)   TempSrc: Oral    SpO2: 100% 95%   Weight:   63.5 kg (140 lb)  Height:   '5\' 4"'$  (1.626 m)       Constitutional: NAD, calm, comfortable Eyes: PERTLA, lids and conjunctivae normal ENMT: Mucous membranes are moist. Posterior pharynx clear of any exudate or lesions.   Neck: normal, supple, no masses, no thyromegaly Respiratory: clear to auscultation bilaterally, no wheezing, no crackles. Normal respiratory effort.   Cardiovascular: S1 & S2 heard, regular rate and rhythm, soft systolic murmur at upper sternal border. No significant JVD. Abdomen: No distension, soft, tender in lower quadrants and periumbilical region, no rebound pain or guarding. Rare bowel sounds.  Musculoskeletal: no clubbing / cyanosis. No joint deformity upper and lower extremities. Normal muscle tone.  Skin: no significant rashes, lesions, ulcers. Warm, dry, well-perfused. Neurologic: CN 2-12 grossly intact. Sensation intact, DTR normal. Strength 5/5 in all 4 limbs.  Psychiatric: Normal judgment and insight. Alert and oriented x 3. Normal mood and affect.     Labs on Admission: I have personally reviewed following labs and imaging studies  CBC:  Recent Labs Lab 05/05/16 1923  WBC 10.3  HGB 10.5*  HCT 31.9*  MCV 85.5  PLT 960   Basic Metabolic Panel:  Recent Labs Lab 05/05/16 1923  NA 137  K 4.5  CL 111  CO2 19*  GLUCOSE 298*  BUN 27*  CREATININE 0.95  CALCIUM 9.0   GFR: Estimated Creatinine Clearance: 49.6 mL/min (by C-G formula based on SCr of 0.95 mg/dL). Liver Function Tests:  Recent Labs Lab 05/05/16 1923  AST 11*  ALT 11*  ALKPHOS 115  BILITOT 0.7  PROT 6.9  ALBUMIN 3.3*    Recent Labs Lab 05/05/16 1923  LIPASE 15   No results for input(s): AMMONIA in the last 168 hours. Coagulation Profile: No results for input(s): INR, PROTIME in the last 168 hours. Cardiac Enzymes: No results for input(s): CKTOTAL, CKMB, CKMBINDEX, TROPONINI in the last 168 hours. BNP (last 3 results) No results for input(s): PROBNP in the last 8760 hours. HbA1C: No results for input(s):  HGBA1C in the last 72 hours. CBG: No results for input(s): GLUCAP in the last 168 hours. Lipid Profile: No results for input(s): CHOL, HDL, LDLCALC, TRIG, CHOLHDL, LDLDIRECT in the last 72 hours. Thyroid Function Tests: No results for input(s): TSH, T4TOTAL, FREET4, T3FREE, THYROIDAB in the last 72 hours. Anemia Panel: No results for input(s): VITAMINB12, FOLATE, FERRITIN, TIBC, IRON, RETICCTPCT in the last 72 hours. Urine analysis:    Component Value Date/Time   COLORURINE YELLOW 05/05/2016 2227   APPEARANCEUR HAZY (A) 05/05/2016 2227   LABSPEC 1.028 05/05/2016 2227   PHURINE 5.0 05/05/2016 2227   GLUCOSEU >=500 (A) 05/05/2016 2227   HGBUR MODERATE (A) 05/05/2016 2227   BILIRUBINUR NEGATIVE 05/05/2016 2227   BILIRUBINUR small 02/10/2014 1127   KETONESUR 5 (A) 05/05/2016 2227   PROTEINUR 100 (A) 05/05/2016 2227   UROBILINOGEN 1.0 02/10/2014 2233   NITRITE NEGATIVE 05/05/2016 2227   LEUKOCYTESUR MODERATE (A) 05/05/2016 2227   Sepsis Labs: $RemoveBefo'@[id number]$ (procalcitonin:4,lacticidven:4) )No results found for this or any previous visit (from the past 240 hour(s)).   Radiological Exams on Admission:  Ct Abdomen Pelvis W Contrast  Result Date: 05/05/2016 CLINICAL DATA:  Lower abdominal pain EXAM: CT ABDOMEN AND PELVIS WITH CONTRAST TECHNIQUE: Multidetector CT imaging of the abdomen and pelvis was performed using the standard protocol following bolus administration of intravenous contrast. CONTRAST:  16mL ISOVUE-300 IOPAMIDOL (ISOVUE-300) INJECTION 61% COMPARISON:  None. FINDINGS: Lower chest: No acute abnormality. Hepatobiliary: No focal liver abnormality is seen. No gallstones, gallbladder wall thickening, or biliary dilatation. Pancreas: Unremarkable. No pancreatic ductal dilatation or surrounding inflammatory changes. Spleen: Normal in size without focal abnormality. Adrenals/Urinary Tract: Adrenal glands are unremarkable. Kidneys are normal, without renal calculi, focal lesion, or  hydronephrosis. Bladder is well distended although some air is noted in the nondependent portion of the bladder. This may be related to recent instrumentation. Clinical correlation is recommended. Stomach/Bowel: Diverticular change of the colon is noted without evidence of diverticulitis. No obstructive nor inflammatory changes are seen. The appendix is well visualized and within normal limits. No inflammatory changes are noted. An area of ring enhancement is identified in the left mid abdomen within a loop of small bowel suggestive of intermittent intussusception. In the mid abdomen adjacent to a loop of small bowel there is a somewhat irregular 5.3 by 3.3 cm air-fluid collection consistent with focal perforation and abscess formation. This lies in close proximity to the area of intussusception. No other focal abnormality is seen. Vascular/Lymphatic: Aortic atherosclerosis. No enlarged abdominal or pelvic lymph nodes. Reproductive: The uterus is within normal limits. Other: Minimal free pelvic fluid is noted. Musculoskeletal: No acute or significant osseous findings. IMPRESSION: Air-fluid collection adjacent to loop of small bowel consistent with an area of contained perforation. This lies in the mid abdomen and is best seen on image number 58 of series 2. In a small bowel loop adjacent to this there are changes consistent within intermittent intussusception. No obstructive changes are noted. Diverticular change without diverticulitis. Air within the bladder although likely related to instrumentation. Clinical correlation is recommended. Electronically Signed   By: Inez Catalina M.D.   On: 05/05/2016 21:36    EKG: Not performed, will obtain as appropriate   Assessment/Plan  1. Small bowel perforation - Pt presents with severe acute abd pain, is non-toxic, and with labs largely unremarkable  - CT abd/pelvis suggests a contained perforation involving small bowel  - Etiology not yet clear, possibly  diverticular - Gen surgery is consulting and much appreciated; a trial of conservative management is recommended  - Continue IVF hydration, bowel rest, empiric abx with Unasyn, serial exams    2. Type II DM  - A1c was 12.9% in 2015  - Previously treated with Novolog and metformin, then most recently with Invokanmet, but has not taken in several mos d/t financial constraints  - Check CBG's q4h while NPO  - Start a moderate-intensity SSI and adjust prn  - Case management consultation requested for any available resources to help with discharge medications    3. Normocytic anemia  - Hgb is 10.5 on admission, stable relative to priors and with no overt bleeding  - Uncertain etiology, will check anemia panel  - Monitor with periodic CBC   4. UTI - UA is consistent with infection and sample will be sent for culture  - Remote urine culture grew pan-sensitive E coli  - She will be continued on Unasyn as above      DVT prophylaxis: sq Lovenox  Code Status: Full  Family Communication: Husband updated at bedside Disposition Plan: Admit to med-surg Consults called: General  Surgery Admission status: Inpatient    Vianne Bulls, MD [facility] Pager [phone]  If 7PM-7AM, please contact night-coverage [email or web address] Password [facility]  05/05/2016, 10:59 PM

## 2016-05-05 NOTE — Consult Note (Signed)
Reason for Consult: abdominal pain   Bailey [name] is an 66 y.o. female.   HPI: patient is a pleasant 66 year old diabetic female who states at 1 PM today she had the sudden onset of sharp abdominal pain. She describes severe stabbing pain in the mid abdomen and left upper quadrant. This was constant and quite severe and she called her husband to take her to the emergency department. There was no associated nausea, vomiting, fever or chills. Had a normal bowel movement yesterday and somewhat constipated this morning. No melena or hematochezia. Denies any history of chronic GI complaints or similar episodes of recurrent pain. No urinary symptoms. After arriving in the emergency department the pain essentially went away although currently she has some recurrent much milder pain in the same location.  Past Medical History:  Diagnosis Date  . Diabetes mellitus     Past Surgical History:  Procedure Laterality Date  . INCISION AND DRAINAGE ABSCESS Left 02/11/2014   Procedure: INCISION AND DRAINAGE ABSCESS Left Buttock;  Surgeon: Jackolyn Confer, MD;  Location: [facility];  Service: General;  Laterality: Left;  . TOE AMPUTATION     left foot great toe    Family History  Problem Relation Age of Onset  . Diabetes Mother   . Asthma Sister     Social History:  reports that she has never smoked. She has never used smokeless tobacco. She reports that she does not drink alcohol or use drugs.  Allergies: No Known Allergies  Current Facility-Administered Medications  Medication Dose Route Frequency Provider Last Rate Last Dose  . 0.9 %  sodium chloride infusion   Intravenous Continuous Ilene Qua Opyd, MD      . enoxaparin (LOVENOX) injection 40 mg  40 mg Subcutaneous Q24H Vianne Bulls, MD      . Derrill Memo ON 05/06/2016] insulin aspart (novoLOG) injection 0-15 Units  0-15 Units Subcutaneous Q4H Timothy S Opyd, MD      . insulin aspart (novoLOG) injection 7 Units  7 Units Subcutaneous Once Ilene Qua  Opyd, MD      . iopamidol (ISOVUE-300) 61 % injection           . morphine 4 MG/ML injection 1-3 mg  1-3 mg Intravenous Q3H PRN Ilene Qua Opyd, MD      . ondansetron (ZOFRAN) tablet 4 mg  4 mg Oral Q6H PRN Vianne Bulls, MD       Or  . ondansetron (ZOFRAN) injection 4 mg  4 mg Intravenous Q6H PRN Vianne Bulls, MD       Current Outpatient Prescriptions  Medication Sig Dispense Refill  . ibuprofen (ADVIL,MOTRIN) 200 MG tablet Take 400 mg by mouth every 6 (six) hours as needed for moderate pain.    Marland Kitchen acetaminophen (TYLENOL) 500 MG tablet Take 500 mg by mouth every 6 (six) hours as needed for moderate pain (pain).    . insulin aspart protamine- aspart (NOVOLOG MIX 70/30) (70-30) 100 UNIT/ML injection Inject 0.15 mLs (15 Units total) into the skin 2 (two) times daily with a meal. (Patient not taking: Reported on 05/05/2016) 10 mL 11  . Menthol, Topical Analgesic, (ICY HOT EX) Apply 1 application topically daily as needed (pain).       Results for orders placed or performed during the hospital encounter of 05/05/16 (from the past 48 hour(s))  Lipase, blood     Status: None   Collection Time: 05/05/16  7:23 PM  Result Value Ref Range   Lipase 15 11 -  51 U/L  Comprehensive metabolic panel     Status: Abnormal   Collection Time: 05/05/16  7:23 PM  Result Value Ref Range   Sodium 137 135 - 145 mmol/L   Potassium 4.5 3.5 - 5.1 mmol/L   Chloride 111 101 - 111 mmol/L   CO2 19 (L) 22 - 32 mmol/L   Glucose, Bld 298 (H) 65 - 99 mg/dL   BUN 27 (H) 6 - 20 mg/dL   Creatinine, Ser 0.95 0.44 - 1.00 mg/dL   Calcium 9.0 8.9 - 10.3 mg/dL   Total Protein 6.9 6.5 - 8.1 g/dL   Albumin 3.3 (L) 3.5 - 5.0 g/dL   AST 11 (L) 15 - 41 U/L   ALT 11 (L) 14 - 54 U/L   Alkaline Phosphatase 115 38 - 126 U/L   Total Bilirubin 0.7 0.3 - 1.2 mg/dL   GFR calc non Af Amer >60 >60 mL/min   GFR calc Af Amer >60 >60 mL/min    Comment: (NOTE) The eGFR has been calculated using the CKD EPI equation. This calculation has  not been validated in all clinical situations. eGFR's persistently <60 mL/min signify possible Chronic Kidney Disease.    Anion gap 7 5 - 15  CBC     Status: Abnormal   Collection Time: 05/05/16  7:23 PM  Result Value Ref Range   WBC 10.3 4.0 - 10.5 K/uL   RBC 3.73 (L) 3.87 - 5.11 MIL/uL   Hemoglobin 10.5 (L) 12.0 - 15.0 g/dL   HCT 31.9 (L) 36.0 - 46.0 %   MCV 85.5 78.0 - 100.0 fL   MCH 28.2 26.0 - 34.0 pg   MCHC 32.9 30.0 - 36.0 g/dL   RDW 13.1 11.5 - 15.5 %   Platelets 189 150 - 400 K/uL  Urinalysis, Routine w reflex microscopic     Status: Abnormal   Collection Time: 05/05/16 10:27 PM  Result Value Ref Range   Color, Urine YELLOW YELLOW   APPearance HAZY (A) CLEAR   Specific Gravity, Urine 1.028 1.005 - 1.030   pH 5.0 5.0 - 8.0   Glucose, UA >=500 (A) NEGATIVE mg/dL   Hgb urine dipstick MODERATE (A) NEGATIVE   Bilirubin Urine NEGATIVE NEGATIVE   Ketones, ur 5 (A) NEGATIVE mg/dL   Protein, ur 100 (A) NEGATIVE mg/dL   Nitrite NEGATIVE NEGATIVE   Leukocytes, UA MODERATE (A) NEGATIVE   RBC / HPF 6-30 0 - 5 RBC/hpf   WBC, UA TOO NUMEROUS TO COUNT 0 - 5 WBC/hpf   Bacteria, UA RARE (A) NONE SEEN   Squamous Epithelial / LPF 0-5 (A) NONE SEEN   Mucous PRESENT    Hyaline Casts, UA PRESENT     Ct Abdomen Pelvis W Contrast  Result Date: 05/05/2016 CLINICAL DATA:  Lower abdominal pain EXAM: CT ABDOMEN AND PELVIS WITH CONTRAST TECHNIQUE: Multidetector CT imaging of the abdomen and pelvis was performed using the standard protocol following bolus administration of intravenous contrast. CONTRAST:  121m ISOVUE-300 IOPAMIDOL (ISOVUE-300) INJECTION 61% COMPARISON:  None. FINDINGS: Lower chest: No acute abnormality. Hepatobiliary: No focal liver abnormality is seen. No gallstones, gallbladder wall thickening, or biliary dilatation. Pancreas: Unremarkable. No pancreatic ductal dilatation or surrounding inflammatory changes. Spleen: Normal in size without focal abnormality. Adrenals/Urinary  Tract: Adrenal glands are unremarkable. Kidneys are normal, without renal calculi, focal lesion, or hydronephrosis. Bladder is well distended although some air is noted in the nondependent portion of the bladder. This may be related to recent instrumentation. Clinical correlation is recommended.  Stomach/Bowel: Diverticular change of the colon is noted without evidence of diverticulitis. No obstructive nor inflammatory changes are seen. The appendix is well visualized and within normal limits. No inflammatory changes are noted. An area of ring enhancement is identified in the left mid abdomen within a loop of small bowel suggestive of intermittent intussusception. In the mid abdomen adjacent to a loop of small bowel there is a somewhat irregular 5.3 by 3.3 cm air-fluid collection consistent with focal perforation and abscess formation. This lies in close proximity to the area of intussusception. No other focal abnormality is seen. Vascular/Lymphatic: Aortic atherosclerosis. No enlarged abdominal or pelvic lymph nodes. Reproductive: The uterus is within normal limits. Other: Minimal free pelvic fluid is noted. Musculoskeletal: No acute or significant osseous findings. IMPRESSION: Air-fluid collection adjacent to loop of small bowel consistent with an area of contained perforation. This lies in the mid abdomen and is best seen on image number 58 of series 2. In a small bowel loop adjacent to this there are changes consistent within intermittent intussusception. No obstructive changes are noted. Diverticular change without diverticulitis. Air within the bladder although likely related to instrumentation. Clinical correlation is recommended. Electronically Signed   By: Inez Catalina M.D.   On: 05/05/2016 21:36    Review of Systems  Constitutional: Negative for chills, fever, malaise/fatigue and weight loss.  Respiratory: Negative.   Cardiovascular: Negative.   Gastrointestinal: Positive for abdominal pain and  constipation. Negative for blood in stool, diarrhea, heartburn, melena, nausea and vomiting.  Genitourinary: Negative.    Blood pressure 136/80, pulse 86, temperature 98.2 F (36.8 C), resp. rate 18, height _0  (1.626 m), weight 63.5 kg (140 lb), SpO2 100 %. Physical Exam General: Alert, mildly obese Caucasian female, in no distress Skin: Warm and dry without rash or infection. HEENT: No palpable masses or thyromegaly. Sclera nonicteric. Pupils equal round and reactive.  Lymph nodes: No cervical, supraclavicular, or inguinal nodes palpable. Lungs: Breath sounds clear and equal without increased work of breathing Cardiovascular: Regular rate and rhythm without murmur. No JVD or edema.  Abdomen: Nondistended. Bowel sounds hypoactive. There is mild tenderness in the mid abdomen and left upper quadrant without guarding.. No masses palpable. No organomegaly. No palpable hernias. Extremities: No edema or joint swelling or deformity. No chronic venous stasis changes. Neurologic: Alert and fully oriented. No gross motor deficits.   Assessment/Plan: 66 year old female with poorly controlled diabetes presents with acute midabdominal pain and CT findings of apparent walled off small bowel perforation. This is adjacent to an area of intussusception which may be transient.  She currently looks remarkably well with no fever or elevated white blood count and minimal pain and tenderness. Etiology is unclear, possible small bowel diverticulum, foreign body perforation or potentially tumor. She does not require emergency surgery. Recommend initially trial of antibiotics and bowel rest. She may require small bowel resection. If she clinically resolve she will need careful imaging follow-up of her small bowel.  Pat Elicker T 05/05/2016, 11:12 PM

## 2016-05-05 NOTE — ED Triage Notes (Signed)
Patient c/o lower abd pain that started today. Patient states that she has had many kidney stones before but usually in flank area but this pain feels similar.  Patient states that is diabetic and hasnt eaten today because hasnt felt good.  Patient states that she has had her medications in about 3-4 months due to not able to afford. Patient hasnt checked her CBG in a while either.  Patient denies any urination problems.

## 2016-05-05 NOTE — ED Provider Notes (Signed)
[facility] DEPT Provider Note   CSN: [id number] Arrival date & time: 05/05/16  1718     History   Chief Complaint Chief Complaint  Patient presents with  . lower abd pain    HPI Bailey [name] is a 66 y.o. female.  HPI 66 year old female with history of diabetes who presents with left lower quadrant abdominal pain. The patient states that her pain began as an aching, gnawing, throbbing left lower quadrant pain earlier today. Throughout the day, her pain progressively worsened and she has had nausea and vomiting and has been unable to eat or drink. She has also had constipation. The pain worsened enough that she decided to call her husband who brought her to the ED. She currently states that her pain has actually improved. She continues to feel mild tenderness in the left lower quadrant but it feels improved. No fevers. Of note, she also has not been taking her diabetic medications for the last 5 months. She does have some polyuria but no dysuria.  Past Medical History:  Diagnosis Date  . Diabetes mellitus     Patient Active Problem List   Diagnosis Date Noted  . DKA (diabetic ketoacidoses) ([place]) 02/10/2014  . Abscess of buttock, left 02/10/2014  . Sepsis ([place]) 02/10/2014  . Diabetes mellitus without complication [facility])     Past Surgical History:  Procedure Laterality Date  . INCISION AND DRAINAGE ABSCESS Left 02/11/2014   Procedure: INCISION AND DRAINAGE ABSCESS Left Buttock;  Surgeon: Jackolyn Confer, MD;  Location: [facility];  Service: General;  Laterality: Left;  . TOE AMPUTATION     left foot great toe    OB History    No data available       Home Medications    Prior to Admission medications   Medication Sig Start Date End Date Taking? Authorizing Provider  ibuprofen (ADVIL,MOTRIN) 200 MG tablet Take 400 mg by mouth every 6 (six) hours as needed for moderate pain.   Yes Historical Provider, MD  acetaminophen (TYLENOL) 500 MG tablet Take 500 mg by mouth every 6  (six) hours as needed for moderate pain (pain).    Historical Provider, MD  docusate sodium 100 MG CAPS Take 100 mg by mouth 2 (two) times daily. Patient not taking: Reported on 05/05/2016 02/14/14   Reyne Dumas, MD  HYDROcodone-acetaminophen (NORCO) 5-325 MG per tablet Take 1 tablet by mouth every 6 (six) hours as needed for moderate pain. Patient not taking: Reported on 05/05/2016 02/14/14   Reyne Dumas, MD  insulin aspart protamine- aspart (NOVOLOG MIX 70/30) (70-30) 100 UNIT/ML injection Inject 0.15 mLs (15 Units total) into the skin 2 (two) times daily with a meal. Patient not taking: Reported on 05/05/2016 02/14/14   Reyne Dumas, MD  levofloxacin (LEVAQUIN) 500 MG tablet Take 1 tablet (500 mg total) by mouth daily. Patient not taking: Reported on 05/05/2016 02/14/14   Reyne Dumas, MD  Menthol, Topical Analgesic, (ICY HOT EX) Apply 1 application topically daily as needed (pain).    Historical Provider, MD    Family History Family History  Problem Relation Age of Onset  . Diabetes Mother   . Asthma Sister     Social History Social History  Substance Use Topics  . Smoking status: Never Smoker  . Smokeless tobacco: Never Used  . Alcohol use No     Allergies   Patient has no known allergies.   Review of Systems Review of Systems  Constitutional: Positive for fatigue. Negative for chills and fever.  HENT: Negative for congestion and rhinorrhea.   Eyes: Negative for visual disturbance.  Respiratory: Negative for cough, shortness of breath and wheezing.   Cardiovascular: Negative for chest pain and leg swelling.  Gastrointestinal: Positive for abdominal pain, constipation and nausea. Negative for diarrhea and vomiting.  Genitourinary: Negative for dysuria and flank pain.  Musculoskeletal: Negative for neck pain and neck stiffness.  Skin: Negative for rash and wound.  Allergic/Immunologic: Negative for immunocompromised state.  Neurological: Negative for syncope, weakness and  headaches.  All other systems reviewed and are negative.    Physical Exam Updated Vital Signs BP 150/68 (BP Location: Left Arm)   Pulse 87   Temp 98.2 F (36.8 C)   Resp 18   Ht $R'5\' 4"'sT$  (1.626 m)   Wt 140 lb (63.5 kg)   SpO2 95%   BMI 24.03 kg/m   Physical Exam  Constitutional: She is oriented to person, place, and time. She appears well-developed and well-nourished. No distress.  HENT:  Head: Normocephalic and atraumatic.  Eyes: Conjunctivae are normal.  Neck: Neck supple.  Cardiovascular: Normal rate, regular rhythm and normal heart sounds.  Exam reveals no friction rub.   No murmur heard. Pulmonary/Chest: Effort normal and breath sounds normal. No respiratory distress. She has no wheezes. She has no rales.  Abdominal: She exhibits no distension. There is generalized tenderness and tenderness in the left lower quadrant.  Musculoskeletal: She exhibits no edema.  Neurological: She is alert and oriented to person, place, and time. She exhibits normal muscle tone.  Skin: Skin is warm. Capillary refill takes less than 2 seconds.  Psychiatric: She has a normal mood and affect.  Nursing note and vitals reviewed.    ED Treatments / Results  Labs (all labs ordered are listed, but only abnormal results are displayed) Labs Reviewed  COMPREHENSIVE METABOLIC PANEL - Abnormal; Notable for the following:       Result Value   CO2 19 (*)    Glucose, Bld 298 (*)    BUN 27 (*)    Albumin 3.3 (*)    AST 11 (*)    ALT 11 (*)    All other components within normal limits  CBC - Abnormal; Notable for the following:    RBC 3.73 (*)    Hemoglobin 10.5 (*)    HCT 31.9 (*)    All other components within normal limits  CULTURE, BLOOD (ROUTINE X 2)  CULTURE, BLOOD (ROUTINE X 2)  LIPASE, BLOOD  URINALYSIS, ROUTINE W REFLEX MICROSCOPIC    EKG  EKG Interpretation None       Radiology Ct Abdomen Pelvis W Contrast  Result Date: 05/05/2016 CLINICAL DATA:  Lower abdominal pain EXAM:  CT ABDOMEN AND PELVIS WITH CONTRAST TECHNIQUE: Multidetector CT imaging of the abdomen and pelvis was performed using the standard protocol following bolus administration of intravenous contrast. CONTRAST:  143mL ISOVUE-300 IOPAMIDOL (ISOVUE-300) INJECTION 61% COMPARISON:  None. FINDINGS: Lower chest: No acute abnormality. Hepatobiliary: No focal liver abnormality is seen. No gallstones, gallbladder wall thickening, or biliary dilatation. Pancreas: Unremarkable. No pancreatic ductal dilatation or surrounding inflammatory changes. Spleen: Normal in size without focal abnormality. Adrenals/Urinary Tract: Adrenal glands are unremarkable. Kidneys are normal, without renal calculi, focal lesion, or hydronephrosis. Bladder is well distended although some air is noted in the nondependent portion of the bladder. This may be related to recent instrumentation. Clinical correlation is recommended. Stomach/Bowel: Diverticular change of the colon is noted without evidence of diverticulitis. No obstructive nor inflammatory changes are seen.  The appendix is well visualized and within normal limits. No inflammatory changes are noted. An area of ring enhancement is identified in the left mid abdomen within a loop of small bowel suggestive of intermittent intussusception. In the mid abdomen adjacent to a loop of small bowel there is a somewhat irregular 5.3 by 3.3 cm air-fluid collection consistent with focal perforation and abscess formation. This lies in close proximity to the area of intussusception. No other focal abnormality is seen. Vascular/Lymphatic: Aortic atherosclerosis. No enlarged abdominal or pelvic lymph nodes. Reproductive: The uterus is within normal limits. Other: Minimal free pelvic fluid is noted. Musculoskeletal: No acute or significant osseous findings. IMPRESSION: Air-fluid collection adjacent to loop of small bowel consistent with an area of contained perforation. This lies in the mid abdomen and is best seen  on image number 58 of series 2. In a small bowel loop adjacent to this there are changes consistent within intermittent intussusception. No obstructive changes are noted. Diverticular change without diverticulitis. Air within the bladder although likely related to instrumentation. Clinical correlation is recommended. Electronically Signed   By: Inez Catalina M.D.   On: 05/05/2016 21:36    Procedures .Critical Care Performed by: Duffy Bruce Authorized by: Duffy Bruce   Critical care provider statement:    Critical care time (minutes):  35   Critical care time was exclusive of:  Separately billable procedures and treating other patients   Critical care was necessary to treat or prevent imminent or life-threatening deterioration of the following conditions:  Dehydration and sepsis   Critical care was time spent personally by me on the following activities:  Blood draw for specimens, development of treatment plan with patient or surrogate, discussions with consultants, evaluation of patient's response to treatment, obtaining history from patient or surrogate, examination of patient, ordering and performing treatments and interventions, ordering and review of laboratory studies, pulse oximetry, ordering and review of radiographic studies and re-evaluation of patient's condition   I assumed direction of critical care for this patient from another provider in my specialty: no     (including critical care time)  Medications Ordered in ED Medications  iopamidol (ISOVUE-300) 61 % injection (not administered)  piperacillin-tazobactam (ZOSYN) IVPB 3.375 g (not administered)  sodium chloride 0.9 % bolus 1,000 mL (1,000 mLs Intravenous New Bag/Given 05/05/16 2100)  sodium chloride 0.9 % bolus 1,000 mL (1,000 mLs Intravenous New Bag/Given 05/05/16 2058)  iopamidol (ISOVUE-300) 61 % injection 100 mL (100 mLs Intravenous Contrast Given 05/05/16 2113)     Initial Impression / Assessment and Plan / ED  Course  I have reviewed the triage vital signs and the nursing notes.  Pertinent labs & imaging results that were available during my care of the patient were reviewed by me and considered in my medical decision making (see chart for details).     66 year old female with history of diabetes and medical history as above who presents with generalized abdominal pain, now improved. Patient's initial labs reveal mild dehydration with elevated BUN and depressed bicarbonate. She has a normal white count. However, given her tenderness, differential is broad including includes diverticulitis, ileitis, perforation, or obstruction. Will check CT.  CT scan shows perforated small loop of bowel, which is contained. I discussed with general surgery Dr. Excell Seltzer, who recommends hospitalist admission and IV antibiotics. He will evaluate the patient. IV Zosyn ordered with blood Cx. Fluid given for dehydration. Patient updated and in agreement.  Final Clinical Impressions(s) / ED Diagnoses   Final diagnoses:  Dehydration  Perforated viscus  Small bowel perforation (HCC)    New Prescriptions New Prescriptions   No medications on file     Duffy Bruce, MD 05/05/16 2231

## 2016-05-06 DIAGNOSIS — N390 Urinary tract infection, site not specified: Secondary | ICD-10-CM | POA: Diagnosis present

## 2016-05-06 DIAGNOSIS — E860 Dehydration: Secondary | ICD-10-CM

## 2016-05-06 DIAGNOSIS — R198 Other specified symptoms and signs involving the digestive system and abdomen: Secondary | ICD-10-CM

## 2016-05-06 LAB — GLUCOSE, CAPILLARY
GLUCOSE-CAPILLARY: 168 mg/dL — AB (ref 65–99)
GLUCOSE-CAPILLARY: 111 mg/dL — AB (ref 65–99)
Glucose-Capillary: 101 mg/dL — ABNORMAL HIGH (ref 65–99)
Glucose-Capillary: 109 mg/dL — ABNORMAL HIGH (ref 65–99)
Glucose-Capillary: 120 mg/dL — ABNORMAL HIGH (ref 65–99)
Glucose-Capillary: 135 mg/dL — ABNORMAL HIGH (ref 65–99)
Glucose-Capillary: 230 mg/dL — ABNORMAL HIGH (ref 65–99)

## 2016-05-06 LAB — COMPREHENSIVE METABOLIC PANEL
ALBUMIN: 2.8 g/dL — AB (ref 3.5–5.0)
ALK PHOS: 90 U/L (ref 38–126)
ALT: 9 U/L — ABNORMAL LOW (ref 14–54)
ANION GAP: 7 (ref 5–15)
AST: 10 U/L — ABNORMAL LOW (ref 15–41)
BILIRUBIN TOTAL: 0.6 mg/dL (ref 0.3–1.2)
BUN: 24 mg/dL — AB (ref 6–20)
CALCIUM: 7.9 mg/dL — AB (ref 8.9–10.3)
CO2: 17 mmol/L — ABNORMAL LOW (ref 22–32)
Chloride: 110 mmol/L (ref 101–111)
Creatinine, Ser: 0.97 mg/dL (ref 0.44–1.00)
GFR calc Af Amer: >60 mL/min (ref >60)
GFR calc non Af Amer: 59 mL/min — ABNORMAL LOW (ref >60)
GLUCOSE: 186 mg/dL — AB (ref 65–99)
Potassium: 3.8 mmol/L (ref 3.5–5.1)
Sodium: 134 mmol/L — ABNORMAL LOW (ref 135–145)
TOTAL PROTEIN: 6.1 g/dL — AB (ref 6.5–8.1)

## 2016-05-06 LAB — PROTIME-INR
INR: 1.23
Prothrombin Time: 15.6 seconds — ABNORMAL HIGH (ref 11.4–15.2)

## 2016-05-06 LAB — CBC
HEMATOCRIT: 28.1 % — AB (ref 36.0–46.0)
HEMOGLOBIN: 9.2 g/dL — AB (ref 12.0–15.0)
MCH: 28 pg (ref 26.0–34.0)
MCHC: 32.7 g/dL (ref 30.0–36.0)
MCV: 85.4 fL (ref 78.0–100.0)
Platelets: 185 10*3/uL (ref 150–400)
RBC: 3.29 MIL/uL — ABNORMAL LOW (ref 3.87–5.11)
RDW: 13.3 % (ref 11.5–15.5)
WBC: 8.3 10*3/uL (ref 4.0–10.5)

## 2016-05-06 MED ORDER — MORPHINE SULFATE (PF) 4 MG/ML IV SOLN
1.0000 mg | INTRAVENOUS | Status: DC | PRN
  Administered 2016-05-06: 2 mg via INTRAVENOUS
  Administered 2016-05-06: 1 mg via INTRAVENOUS
  Administered 2016-05-06: 3 mg via INTRAVENOUS
  Administered 2016-05-06 – 2016-05-07 (×3): 2 mg via INTRAVENOUS
  Administered 2016-05-07 (×2): 3 mg via INTRAVENOUS
  Administered 2016-05-08: 2 mg via INTRAVENOUS
  Filled 2016-05-06 (×8): qty 1

## 2016-05-06 MED ORDER — SODIUM CHLORIDE 0.9 % IV SOLN
3.0000 g | Freq: Four times a day (QID) | INTRAVENOUS | Status: DC
  Administered 2016-05-06 – 2016-05-08 (×11): 3 g via INTRAVENOUS
  Filled 2016-05-06 (×13): qty 3

## 2016-05-06 MED ORDER — DEXTROSE IN LACTATED RINGERS 5 % IV SOLN: INTRAVENOUS | Status: DC

## 2016-05-06 MED ORDER — LACTATED RINGERS IV SOLN
INTRAVENOUS | Status: DC
  Administered 2016-05-06 – 2016-05-14 (×16): via INTRAVENOUS

## 2016-05-06 NOTE — Progress Notes (Signed)
Patient ID: Bailey [name], female   DOB: 12-01-1950, 66 y.o.   MRN: [id number]  [facility] Surgery Progress Note     Subjective: Pain well controlled with medication this morning. Denies any n/v. Denies abdominal bloating. Pain is localized to the mid-abdomen.  Objective: Vital signs in last 24 hours: Temp:  [97.7 F (36.5 C)-98.3 F (36.8 C)] 97.7 F (36.5 C) (03/14 0539) Pulse Rate:  [76-89] 76 (03/14 0539) Resp:  [18] 18 (03/14 0539) BP: (113-170)/(49-100) 127/53 (03/14 0539) SpO2:  [94 %-100 %] 100 % (03/14 0539) Weight:  [139 lb 15.9 oz (63.5 kg)-140 lb (63.5 kg)] 139 lb 15.9 oz (63.5 kg) (03/14 0500) Last BM Date: 05/04/16  Intake/Output from previous day: 03/13 0701 - 03/14 0700 In: 663.2 [I.V.:663.2] Out: 600 [Urine:600] Intake/Output this shift: No intake/output data recorded.  PE: Gen:  Alert, NAD, pleasant Card:  RRR, no M/G/R heard Pulm:  CTAB, no W/R/R, effort normal Abd: Soft, ND, +BS, no HSM, mild RLQ tenderness, no rebound or guarding Ext:  No erythema, edema, or tenderness   Lab Results:   Recent Labs  05/05/16 1923 05/06/16 0450  WBC 10.3 8.3  HGB 10.5* 9.2*  HCT 31.9* 28.1*  PLT 189 185   BMET  Recent Labs  05/05/16 1923 05/06/16 0450  NA 137 134*  K 4.5 3.8  CL 111 110  CO2 19* 17*  GLUCOSE 298* 186*  BUN 27* 24*  CREATININE 0.95 0.97  CALCIUM 9.0 7.9*   PT/INR  Recent Labs  05/06/16 0450  LABPROT 15.6*  INR 1.23   CMP     Component Value Date/Time   NA 134 (L) 05/06/2016 0450   K 3.8 05/06/2016 0450   CL 110 05/06/2016 0450   CO2 17 (L) 05/06/2016 0450   GLUCOSE 186 (H) 05/06/2016 0450   BUN 24 (H) 05/06/2016 0450   CREATININE 0.97 05/06/2016 0450   CREATININE 0.74 02/10/2014 1220   CALCIUM 7.9 (L) 05/06/2016 0450   PROT 6.1 (L) 05/06/2016 0450   ALBUMIN 2.8 (L) 05/06/2016 0450   AST 10 (L) 05/06/2016 0450   ALT 9 (L) 05/06/2016 0450   ALKPHOS 90 05/06/2016 0450   BILITOT 0.6 05/06/2016 0450   GFRNONAA  59 (L) 05/06/2016 0450   GFRAA >60 05/06/2016 0450   Lipase     Component Value Date/Time   LIPASE 15 05/05/2016 1923       Studies/Results: Ct Abdomen Pelvis W Contrast  Result Date: 05/05/2016 CLINICAL DATA:  Lower abdominal pain EXAM: CT ABDOMEN AND PELVIS WITH CONTRAST TECHNIQUE: Multidetector CT imaging of the abdomen and pelvis was performed using the standard protocol following bolus administration of intravenous contrast. CONTRAST:  ISOVUE-300 IOPAMIDOL (ISOVUE-300) INJECTION 61% COMPARISON:  None. FINDINGS: Lower chest: No acute abnormality. Hepatobiliary: No focal liver abnormality is seen. No gallstones, gallbladder wall thickening, or biliary dilatation. Pancreas: Unremarkable. No pancreatic ductal dilatation or surrounding inflammatory changes. Spleen: Normal in size without focal abnormality. Adrenals/Urinary Tract: Adrenal glands are unremarkable. Kidneys are normal, without renal calculi, focal lesion, or hydronephrosis. Bladder is well distended although some air is noted in the nondependent portion of the bladder. This may be related to recent instrumentation. Clinical correlation is recommended. Stomach/Bowel: Diverticular change of the colon is noted without evidence of diverticulitis. No obstructive nor inflammatory changes are seen. The appendix is well visualized and within normal limits. No inflammatory changes are noted. An area of ring enhancement is identified in the left mid abdomen within a loop of small  bowel suggestive of intermittent intussusception. In the mid abdomen adjacent to a loop of small bowel there is a somewhat irregular 5.3 by 3.3 cm air-fluid collection consistent with focal perforation and abscess formation. This lies in close proximity to the area of intussusception. No other focal abnormality is seen. Vascular/Lymphatic: Aortic atherosclerosis. No enlarged abdominal or pelvic lymph nodes. Reproductive: The uterus is within normal limits. Other:  Minimal free pelvic fluid is noted. Musculoskeletal: No acute or significant osseous findings. IMPRESSION: Air-fluid collection adjacent to loop of small bowel consistent with an area of contained perforation. This lies in the mid abdomen and is best seen on image number 58 of series 2. In a small bowel loop adjacent to this there are changes consistent within intermittent intussusception. No obstructive changes are noted. Diverticular change without diverticulitis. Air within the bladder although likely related to instrumentation. Clinical correlation is recommended. Electronically Signed   By: Inez Catalina M.D.   On: 05/05/2016 21:36    Anti-infectives: Anti-infectives    Start     Dose/Rate Route Frequency Ordered Stop   05/06/16 0600  Ampicillin-Sulbactam (UNASYN) 3 g in sodium chloride 0.9 % 100 mL IVPB     3 g 200 mL/hr over 30 Minutes Intravenous Every 6 hours 05/06/16 0220     05/05/16 2215  piperacillin-tazobactam (ZOSYN) IVPB 3.375 g     3.375 g 100 mL/hr over 30 Minutes Intravenous  Once 05/05/16 2211 05/05/16 2303       Assessment/Plan Bowel perforation and intussusception, unclear etiology  - ?possible small bowel diverticulum, foreign body perforation or potentially tumor? - no prior h/o abdominal surgery - acute onset mid-abdominal pain - WBC 8.3 today, VSS  DM poorly controlled Chronic normocytic anemia UTI  ID - unasyn 3/14>> FEN - IVF, NPO VTE - SCDs, lovenox  Plan - Continue IV antibiotics and bowel rest.   LOS: 1 day    Jerrye Beavers , [facility] Surgery 05/06/2016, 8:48 AM Pager: [phone] Consults: [phone] Mon-Fri 7:00 am-4:30 pm Sat-Sun 7:00 am-11:30 am

## 2016-05-06 NOTE — Progress Notes (Signed)
Pharmacy Antibiotic Note  Bailey [name] is a 66 y.o. female admitted on 05/05/2016 with Intra-abdominal infection.  Pharmacy has been consulted for Unasyn dosing.  Plan: Unasyn 3gm iv q6hr  Height: $Remove'5\' 4"'VIOLWvN$  (162.6 cm) Weight: 140 lb (63.5 kg) IBW/kg (Calculated) : 54.7  Temp (24hrs), Avg:98.2 F (36.8 C), Min:98 F (36.7 C), Max:98.3 F (36.8 C)   Recent Labs Lab 05/05/16 1923  WBC 10.3  CREATININE 0.95    Estimated Creatinine Clearance: 49.6 mL/min (by C-G formula based on SCr of 0.95 mg/dL).    No Known Allergies  Antimicrobials this admission: Zosyn 3/13 x1 Unasyn 3/14 >>  Dose adjustments this admission: -  Microbiology results: pending  Thank you for allowing pharmacy to be a part of this patient's care.  Nani Skillern Crowford 05/06/2016 2:22 AM

## 2016-05-06 NOTE — Progress Notes (Signed)
PROGRESS NOTE    Bailey [name]  HYQ:[id number] DOB: 21-Jun-1950 DOA: 05/05/2016 PCP: No PCP Per Patient    Brief Narrative:  66 yo female with past medical history of T2dm, presented with abdominal pain, constant and severe.  On the initial physical examination, she was found dehydrated, tender abdomen to palpation, CT positive for small bowel perforation. Surgery consulted with recommendations for conservative care. Started on IV antibiotics and IV fluids, NPO.    Assessment & Plan:   Principal Problem:   Small bowel perforation (HCC) Active Problems:   Diabetes mellitus without complication (HCC)   Normocytic anemia   UTI (urinary tract infection)   Dehydration   Perforated viscus   1. Small bowel perforation. Will continue supportive medical therapy with antibiotics Unasyn and IV fluids, will change to a balanced electrolyte solution with lactate ringers. Follow on surgical recommendations, will need re-imaging on this admission. Continue npo, pain control with morphine, will increase as needed q 2 hours.    2. T2DM. Patient is npo, will continue glucose cover with insulin sliding scale, capillary glucose 168, 135, 101. Will calculate insulin requirements before starting basal insulin regimen.   3. Anemia. Hb and hct stable at 9.2 from 10.5, no signs of active bleeding, will continue to close follow up.   4. Urine infection. Urine analysis with TNT wbc, will continue antibiotic therapy with unasyn, will follow on cultures, cell count and temperature curve.    DVT prophylaxis: enoxaparin  Code Status: full  Family Communication: No family at the bedside  Disposition Plan: Home   Consultants:   Surgery   Procedures:     Antimicrobials:   Unasyn     Subjective: Patient with positive abdominal pain, moderate to severe in intensity, no radiation, mild improvement with pain medications, no worsening factors. Patient is NPO.   Objective: Vitals:   05/05/16 2330  05/05/16 2358 05/06/16 0500 05/06/16 0539  BP: 113/100 (!) 126/49  (!) 127/53  Pulse: 84 84  76  Resp:  18  18  Temp:  98 F (36.7 C)  97.7 F (36.5 C)  TempSrc:  Oral  Oral  SpO2: 98% 100%  100%  Weight:   63.5 kg (139 lb 15.9 oz)   Height:        Intake/Output Summary (Last 24 hours) at 05/06/16 1155 Last data filed at 05/06/16 1020  Gross per 24 hour  Intake           663.17 ml  Output              700 ml  Net           -36.83 ml   Filed Weights   05/05/16 2219 05/06/16 0500  Weight: 63.5 kg (140 lb) 63.5 kg (139 lb 15.9 oz)    Examination:  General exam: in pain, deconditioned E ENT: mild pallor, oral mucosa dry, no icterus. Respiratory system: Clear to auscultation. Respiratory effort normal. No wheezing, rales or rhonchi.  Cardiovascular system: S1 & S2 heard, RRR. No JVD, murmurs, rubs, gallops or clicks. No pedal edema. Gastrointestinal system: Abdomen is mild distended, soft but tender to deep palpation in the lower quadrants. No organomegaly or masses felt. Normal bowel sounds heard. Central nervous system: Alert and oriented. No focal neurological deficits. Extremities: Symmetric 5 x 5 power. Skin: No rashes, lesions or ulcers    Data Reviewed: I have personally reviewed following labs and imaging studies  CBC:  Recent Labs Lab 05/05/16 1923 05/06/16 0450  WBC 10.3 8.3  HGB 10.5* 9.2*  HCT 31.9* 28.1*  MCV 85.5 85.4  PLT 189 161   Basic Metabolic Panel:  Recent Labs Lab 05/05/16 1923 05/06/16 0450  NA 137 134*  K 4.5 3.8  CL 111 110  CO2 19* 17*  GLUCOSE 298* 186*  BUN 27* 24*  CREATININE 0.95 0.97  CALCIUM 9.0 7.9*   GFR: Estimated Creatinine Clearance: 48.6 mL/min (by C-G formula based on SCr of 0.97 mg/dL). Liver Function Tests:  Recent Labs Lab 05/05/16 1923 05/06/16 0450  AST 11* 10*  ALT 11* 9*  ALKPHOS 115 90  BILITOT 0.7 0.6  PROT 6.9 6.1*  ALBUMIN 3.3* 2.8*    Recent Labs Lab 05/05/16 1923  LIPASE 15   No  results for input(s): AMMONIA in the last 168 hours. Coagulation Profile:  Recent Labs Lab 05/06/16 0450  INR 1.23   Cardiac Enzymes: No results for input(s): CKTOTAL, CKMB, CKMBINDEX, TROPONINI in the last 168 hours. BNP (last 3 results) No results for input(s): PROBNP in the last 8760 hours. HbA1C: No results for input(s): HGBA1C in the last 72 hours. CBG:  Recent Labs Lab 05/06/16 0012 05/06/16 0402 05/06/16 0804  GLUCAP 230* 168* 135*   Lipid Profile: No results for input(s): CHOL, HDL, LDLCALC, TRIG, CHOLHDL, LDLDIRECT in the last 72 hours. Thyroid Function Tests: No results for input(s): TSH, T4TOTAL, FREET4, T3FREE, THYROIDAB in the last 72 hours. Anemia Panel: No results for input(s): VITAMINB12, FOLATE, FERRITIN, TIBC, IRON, RETICCTPCT in the last 72 hours. Sepsis Labs: No results for input(s): PROCALCITON, LATICACIDVEN in the last 168 hours.  Recent Results (from the past 240 hour(s))  Blood culture (routine x 2)     Status: None (Preliminary result)   Collection Time: 05/05/16 10:15 PM  Result Value Ref Range Status   Specimen Description BLOOD LEFT ANTECUBITAL  Final   Special Requests BOTTLES DRAWN AEROBIC AND ANAEROBIC 5 ML EA  Final   Culture   Final    NO GROWTH < 12 HOURS Performed at [facility], [place] [address], [place], [place] [address]    Report Status PENDING  Incomplete  Blood culture (routine x 2)     Status: None (Preliminary result)   Collection Time: 05/05/16 10:25 PM  Result Value Ref Range Status   Specimen Description BLOOD L WRIST  Final   Special Requests IN PEDIATRIC BOTTLE 2ML  Final   Culture   Final    NO GROWTH < 12 HOURS Performed at [facility], [place] [address] [place], [place] [address]    Report Status PENDING  Incomplete         Radiology Studies: Ct Abdomen Pelvis W Contrast  Result Date: 05/05/2016 CLINICAL DATA:  Lower abdominal pain EXAM: CT ABDOMEN AND PELVIS WITH CONTRAST TECHNIQUE:  Multidetector CT imaging of the abdomen and pelvis was performed using the standard protocol following bolus administration of intravenous contrast. CONTRAST:  141mL ISOVUE-300 IOPAMIDOL (ISOVUE-300) INJECTION 61% COMPARISON:  None. FINDINGS: Lower chest: No acute abnormality. Hepatobiliary: No focal liver abnormality is seen. No gallstones, gallbladder wall thickening, or biliary dilatation. Pancreas: Unremarkable. No pancreatic ductal dilatation or surrounding inflammatory changes. Spleen: Normal in size without focal abnormality. Adrenals/Urinary Tract: Adrenal glands are unremarkable. Kidneys are normal, without renal calculi, focal lesion, or hydronephrosis. Bladder is well distended although some air is noted in the nondependent portion of the bladder. This may be related to recent instrumentation. Clinical correlation is recommended. Stomach/Bowel: Diverticular change of the  colon is noted without evidence of diverticulitis. No obstructive nor inflammatory changes are seen. The appendix is well visualized and within normal limits. No inflammatory changes are noted. An area of ring enhancement is identified in the left mid abdomen within a loop of small bowel suggestive of intermittent intussusception. In the mid abdomen adjacent to a loop of small bowel there is a somewhat irregular 5.3 by 3.3 cm air-fluid collection consistent with focal perforation and abscess formation. This lies in close proximity to the area of intussusception. No other focal abnormality is seen. Vascular/Lymphatic: Aortic atherosclerosis. No enlarged abdominal or pelvic lymph nodes. Reproductive: The uterus is within normal limits. Other: Minimal free pelvic fluid is noted. Musculoskeletal: No acute or significant osseous findings. IMPRESSION: Air-fluid collection adjacent to loop of small bowel consistent with an area of contained perforation. This lies in the mid abdomen and is best seen on image number 58 of series 2. In a small  bowel loop adjacent to this there are changes consistent within intermittent intussusception. No obstructive changes are noted. Diverticular change without diverticulitis. Air within the bladder although likely related to instrumentation. Clinical correlation is recommended. Electronically Signed   By: Inez Catalina M.D.   On: 05/05/2016 21:36        Scheduled Meds: . ampicillin-sulbactam (UNASYN) IV  3 g Intravenous Q6H  . enoxaparin (LOVENOX) injection  40 mg Subcutaneous QHS  . insulin aspart  0-15 Units Subcutaneous Q4H  . insulin aspart  7 Units Subcutaneous Once   Continuous Infusions: . dextrose 5% lactated ringers       LOS: 1 day     Berlin Viereck Gerome Apley, MD [facility] Pager [phone]  If 7PM-7AM, please contact night-coverage [email or web address] Password [facility] 05/06/2016, 11:55 AM

## 2016-05-07 LAB — CBC
HCT: 28 % — ABNORMAL LOW (ref 36.0–46.0)
HEMOGLOBIN: 9.1 g/dL — AB (ref 12.0–15.0)
MCH: 28.1 pg (ref 26.0–34.0)
MCHC: 32.5 g/dL (ref 30.0–36.0)
MCV: 86.4 fL (ref 78.0–100.0)
Platelets: 181 10*3/uL (ref 150–400)
RBC: 3.24 MIL/uL — AB (ref 3.87–5.11)
RDW: 13.4 % (ref 11.5–15.5)
WBC: 5.9 10*3/uL (ref 4.0–10.5)

## 2016-05-07 LAB — GLUCOSE, CAPILLARY
GLUCOSE-CAPILLARY: 106 mg/dL — AB (ref 65–99)
GLUCOSE-CAPILLARY: 104 mg/dL — AB (ref 65–99)
GLUCOSE-CAPILLARY: 103 mg/dL — AB (ref 65–99)
Glucose-Capillary: 101 mg/dL — ABNORMAL HIGH (ref 65–99)
Glucose-Capillary: 103 mg/dL — ABNORMAL HIGH (ref 65–99)
Glucose-Capillary: 115 mg/dL — ABNORMAL HIGH (ref 65–99)

## 2016-05-07 LAB — BASIC METABOLIC PANEL
ANION GAP: 9 (ref 5–15)
BUN: 22 mg/dL — ABNORMAL HIGH (ref 6–20)
CO2: 16 mmol/L — ABNORMAL LOW (ref 22–32)
Calcium: 8.2 mg/dL — ABNORMAL LOW (ref 8.9–10.3)
Chloride: 117 mmol/L — ABNORMAL HIGH (ref 101–111)
Creatinine, Ser: 1 mg/dL (ref 0.44–1.00)
GFR calc Af Amer: >60 mL/min (ref >60)
GFR, EST NON AFRICAN AMERICAN: 57 mL/min — AB (ref >60)
Glucose, Bld: 117 mg/dL — ABNORMAL HIGH (ref 65–99)
POTASSIUM: 3.9 mmol/L (ref 3.5–5.1)
SODIUM: 142 mmol/L (ref 135–145)

## 2016-05-07 LAB — HEMOGLOBIN A1C
HEMOGLOBIN A1C: 10.2 % — AB (ref 4.8–5.6)
Mean Plasma Glucose: 246 mg/dL

## 2016-05-07 LAB — URINE CULTURE

## 2016-05-07 MED ORDER — LIP MEDEX EX OINT
TOPICAL_OINTMENT | CUTANEOUS | Status: AC
  Administered 2016-05-07: 14:00:00
  Filled 2016-05-07: qty 7

## 2016-05-07 NOTE — Progress Notes (Signed)
Patient ID: Bailey [name], female   DOB: 1950/07/28, 66 y.o.   MRN: [id number]  [facility] Surgery Progress Note     Subjective: Patient up out of bed this morning. States that her abdominal pain is significantly improved. She feels a little distended and some lower abdominal pain, but much better than yesterday.  Denies n/v. Passing flatus. No BM  Objective: Vital signs in last 24 hours: Temp:  [97.9 F (36.6 C)-98.7 F (37.1 C)] 97.9 F (36.6 C) (03/15 0540) Pulse Rate:  [71-77] 71 (03/15 0540) Resp:  [16-18] 16 (03/15 0540) BP: (134-144)/(51-74) 134/59 (03/15 0540) SpO2:  [96 %-97 %] 97 % (03/15 0540) Weight:  [146 lb 13.2 oz (66.6 kg)] 146 lb 13.2 oz (66.6 kg) (03/15 0500) Last BM Date: 05/04/16  Intake/Output from previous day: 03/14 0701 - 03/15 0700 In: 1662.5 [I.V.:1162.5; IV Piggyback:500] Out: 700 [Urine:700] Intake/Output this shift: No intake/output data recorded.  PE: Gen:  Alert, NAD, pleasant Card:  RRR, no M/G/R heard Pulm:  CTAB, no W/R/R, effort normal Abd: Soft, ND, +BS, no HSM, mild lower abdominal tenderness, no rebound or guarding Ext:  No erythema, edema, or tenderness   Lab Results:   Recent Labs  05/06/16 0450 05/07/16 0654  WBC 8.3 5.9  HGB 9.2* 9.1*  HCT 28.1* 28.0*  PLT 185 181   BMET  Recent Labs  05/06/16 0450 05/07/16 0448  NA 134* 142  K 3.8 3.9  CL 110 117*  CO2 17* 16*  GLUCOSE 186* 117*  BUN 24* 22*  CREATININE 0.97 1.00  CALCIUM 7.9* 8.2*   PT/INR  Recent Labs  05/06/16 0450  LABPROT 15.6*  INR 1.23   CMP     Component Value Date/Time   NA 142 05/07/2016 0448   K 3.9 05/07/2016 0448   CL 117 (H) 05/07/2016 0448   CO2 16 (L) 05/07/2016 0448   GLUCOSE 117 (H) 05/07/2016 0448   BUN 22 (H) 05/07/2016 0448   CREATININE 1.00 05/07/2016 0448   CREATININE 0.74 02/10/2014 1220   CALCIUM 8.2 (L) 05/07/2016 0448   PROT 6.1 (L) 05/06/2016 0450   ALBUMIN 2.8 (L) 05/06/2016 0450   AST 10 (L) 05/06/2016  0450   ALT 9 (L) 05/06/2016 0450   ALKPHOS 90 05/06/2016 0450   BILITOT 0.6 05/06/2016 0450   GFRNONAA 57 (L) 05/07/2016 0448   GFRAA >60 05/07/2016 0448   Lipase     Component Value Date/Time   LIPASE 15 05/05/2016 1923       Studies/Results: Ct Abdomen Pelvis W Contrast  Result Date: 05/05/2016 CLINICAL DATA:  Lower abdominal pain EXAM: CT ABDOMEN AND PELVIS WITH CONTRAST TECHNIQUE: Multidetector CT imaging of the abdomen and pelvis was performed using the standard protocol following bolus administration of intravenous contrast. CONTRAST:  186mL ISOVUE-300 IOPAMIDOL (ISOVUE-300) INJECTION 61% COMPARISON:  None. FINDINGS: Lower chest: No acute abnormality. Hepatobiliary: No focal liver abnormality is seen. No gallstones, gallbladder wall thickening, or biliary dilatation. Pancreas: Unremarkable. No pancreatic ductal dilatation or surrounding inflammatory changes. Spleen: Normal in size without focal abnormality. Adrenals/Urinary Tract: Adrenal glands are unremarkable. Kidneys are normal, without renal calculi, focal lesion, or hydronephrosis. Bladder is well distended although some air is noted in the nondependent portion of the bladder. This may be related to recent instrumentation. Clinical correlation is recommended. Stomach/Bowel: Diverticular change of the colon is noted without evidence of diverticulitis. No obstructive nor inflammatory changes are seen. The appendix is well visualized and within normal limits. No inflammatory changes are  noted. An area of ring enhancement is identified in the left mid abdomen within a loop of small bowel suggestive of intermittent intussusception. In the mid abdomen adjacent to a loop of small bowel there is a somewhat irregular 5.3 by 3.3 cm air-fluid collection consistent with focal perforation and abscess formation. This lies in close proximity to the area of intussusception. No other focal abnormality is seen. Vascular/Lymphatic: Aortic  atherosclerosis. No enlarged abdominal or pelvic lymph nodes. Reproductive: The uterus is within normal limits. Other: Minimal free pelvic fluid is noted. Musculoskeletal: No acute or significant osseous findings. IMPRESSION: Air-fluid collection adjacent to loop of small bowel consistent with an area of contained perforation. This lies in the mid abdomen and is best seen on image number 58 of series 2. In a small bowel loop adjacent to this there are changes consistent within intermittent intussusception. No obstructive changes are noted. Diverticular change without diverticulitis. Air within the bladder although likely related to instrumentation. Clinical correlation is recommended. Electronically Signed   By: Inez Catalina M.D.   On: 05/05/2016 21:36    Anti-infectives: Anti-infectives    Start     Dose/Rate Route Frequency Ordered Stop   05/06/16 0600  Ampicillin-Sulbactam (UNASYN) 3 g in sodium chloride 0.9 % 100 mL IVPB     3 g 200 mL/hr over 30 Minutes Intravenous Every 6 hours 05/06/16 0220     05/05/16 2215  piperacillin-tazobactam (ZOSYN) IVPB 3.375 g     3.375 g 100 mL/hr over 30 Minutes Intravenous  Once 05/05/16 2211 05/05/16 2303       Assessment/Plan Bowel perforation and intussusception, unclear etiology  - ?possible small bowel diverticulum, foreign body perforation or potentially tumor? - no prior h/o abdominal surgery - acute onset mid-abdominal pain - WBC 5.9 today, VSS  DM poorly controlled Chronic normocytic anemia UTI  ID - unasyn 3/14>> FEN - IVF, NPO VTE - SCDs, lovenox  Plan - Abdominal pain improving. Continue IV antibiotics and bowel rest. Plan for repeat CT scan in AM.   LOS: 2 days    Jerrye Beavers , [facility] Surgery 05/07/2016, 8:55 AM Pager: [phone] Consults: [phone] Mon-Fri 7:00 am-4:30 pm Sat-Sun 7:00 am-11:30 am

## 2016-05-07 NOTE — Progress Notes (Signed)
PROGRESS NOTE    Bailey [name]  OYD:[id number] DOB: 1950-12-26 DOA: 05/05/2016 PCP: No PCP Per Patient    Brief Narrative:  66 yo female with past medical history of T2dm, presented with abdominal pain, constant and severe.  On the initial physical examination, she was found dehydrated, tender abdomen to palpation, CT positive for small bowel perforation. Surgery consulted with recommendations for conservative care. Started on IV antibiotics and IV fluids, NPO.   Assessment & Plan:   Principal Problem:   Small bowel perforation (HCC) Active Problems:   Diabetes mellitus without complication (HCC)   Normocytic anemia   UTI (urinary tract infection)   Dehydration   Perforated viscus    1. Small bowel perforation. On supportive medical therapy with antibiotics Unasyn #1 and IV fluids. Follow on surgery recommendations for timing for  re-imaging. Pain with improved control, continue morphine IV, continue npo for now. Patient with no bowel movement and reports not passing gas.   2. T2DM. Patient continue npo, glucose cover and monitoring with insulin sliding scale, capillary glucose 106, 104, 115. Hold on basal insulin for now to prevent hypoglycemia.   3. Anemia. Hb and hct stable at 9.1.  4. Urine infection. Urine analysis with TNT wbc,  antibiotic therapy with unasyn. Urine culture with poly-bacterial. Stable white cell count 8,3 and 5,9.   DVT prophylaxis: enoxaparin  Code Status: full  Family Communication: No family at the bedside  Disposition Plan: Home   Consultants:   Surgery   Procedures:     Antimicrobials:   Unasyn      Subjective: Patient feeling better, improved abdominal pain, but still persistent, no radiation, improved with pain medications, worse with palpation. No nausea or vomiting, has not pass gas, no bowel movements.   Objective: Vitals:   05/06/16 1500 05/06/16 2114 05/07/16 0500 05/07/16 0540  BP: (!) 144/74 (!) 135/51  (!) 134/59    Pulse: 77 73  71  Resp: $Remo'18 18  16  'EWqye$ Temp: 98.7 F (37.1 C) 98.6 F (37 C)  97.9 F (36.6 C)  TempSrc: Oral Oral  Oral  SpO2: 97% 96%  97%  Weight:   66.6 kg (146 lb 13.2 oz)   Height:        Intake/Output Summary (Last 24 hours) at 05/07/16 1336 Last data filed at 05/07/16 1000  Gross per 24 hour  Intake           1562.5 ml  Output              850 ml  Net            712.5 ml   Filed Weights   05/05/16 2219 05/06/16 0500 05/07/16 0500  Weight: 63.5 kg (140 lb) 63.5 kg (139 lb 15.9 oz) 66.6 kg (146 lb 13.2 oz)    Examination:  General exam: deconditioned E ENT: Mild pallor, oral mucosa moist, no icterus.  Respiratory system: Clear to auscultation. Respiratory effort normal. No wheezing, rales or rhonchi.  Cardiovascular system: S1 & S2 heard, RRR. No JVD, murmurs, rubs, gallops or clicks. No pedal edema. Gastrointestinal system: Abdomen is nondistended, soft, tender to deep palpation at the lower quadrants. No organomegaly or masses felt. Normal bowel sounds heard. Central nervous system: Alert and oriented. No focal neurological deficits. Extremities: Symmetric 5 x 5 power. Skin: No rashes, lesions or ulcers     Data Reviewed: I have personally reviewed following labs and imaging studies  CBC:  Recent Labs Lab 05/05/16 1923 05/06/16 0450  05/07/16 0654  WBC 10.3 8.3 5.9  HGB 10.5* 9.2* 9.1*  HCT 31.9* 28.1* 28.0*  MCV 85.5 85.4 86.4  PLT 189 185 181   Basic Metabolic Panel:  Recent Labs Lab 05/05/16 1923 05/06/16 0450 05/07/16 0448  NA 137 134* 142  K 4.5 3.8 3.9  CL 111 110 117*  CO2 19* 17* 16*  GLUCOSE 298* 186* 117*  BUN 27* 24* 22*  CREATININE 0.95 0.97 1.00  CALCIUM 9.0 7.9* 8.2*   GFR: Estimated Creatinine Clearance: 51.3 mL/min (by C-G formula based on SCr of 1 mg/dL). Liver Function Tests:  Recent Labs Lab 05/05/16 1923 05/06/16 0450  AST 11* 10*  ALT 11* 9*  ALKPHOS 115 90  BILITOT 0.7 0.6  PROT 6.9 6.1*  ALBUMIN 3.3* 2.8*     Recent Labs Lab 05/05/16 1923  LIPASE 15   No results for input(s): AMMONIA in the last 168 hours. Coagulation Profile:  Recent Labs Lab 05/06/16 0450  INR 1.23   Cardiac Enzymes: No results for input(s): CKTOTAL, CKMB, CKMBINDEX, TROPONINI in the last 168 hours. BNP (last 3 results) No results for input(s): PROBNP in the last 8760 hours. HbA1C:  Recent Labs  05/06/16 0004  HGBA1C 10.2*   CBG:  Recent Labs Lab 05/06/16 1937 05/06/16 2353 05/07/16 0408 05/07/16 0824 05/07/16 1203  GLUCAP 111* 120* 106* 104* 115*   Lipid Profile: No results for input(s): CHOL, HDL, LDLCALC, TRIG, CHOLHDL, LDLDIRECT in the last 72 hours. Thyroid Function Tests: No results for input(s): TSH, T4TOTAL, FREET4, T3FREE, THYROIDAB in the last 72 hours. Anemia Panel: No results for input(s): VITAMINB12, FOLATE, FERRITIN, TIBC, IRON, RETICCTPCT in the last 72 hours. Sepsis Labs: No results for input(s): PROCALCITON, LATICACIDVEN in the last 168 hours.  Recent Results (from the past 240 hour(s))  Blood culture (routine x 2)     Status: None (Preliminary result)   Collection Time: 05/05/16 10:15 PM  Result Value Ref Range Status   Specimen Description BLOOD LEFT ANTECUBITAL  Final   Special Requests BOTTLES DRAWN AEROBIC AND ANAEROBIC 5 ML EA  Final   Culture   Final    NO GROWTH < 12 HOURS Performed at [facility], [address], [place], [place] [address]    Report Status PENDING  Incomplete  Blood culture (routine x 2)     Status: None (Preliminary result)   Collection Time: 05/05/16 10:25 PM  Result Value Ref Range Status   Specimen Description BLOOD L WRIST  Final   Special Requests IN PEDIATRIC BOTTLE  Final   Culture   Final    NO GROWTH < 12 HOURS Performed at [facility], [address], [place], [place] [address]    Report Status PENDING  Incomplete  Urine culture     Status: Abnormal   Collection Time: 05/05/16 10:27 PM  Result Value Ref Range  Status   Specimen Description URINE, RANDOM  Final   Special Requests NONE  Final   Culture MULTIPLE SPECIES PRESENT, SUGGEST RECOLLECTION (A)  Final   Report Status 05/07/2016 FINAL  Final         Radiology Studies: Ct Abdomen Pelvis W Contrast  Result Date: 05/05/2016 CLINICAL DATA:  Lower abdominal pain EXAM: CT ABDOMEN AND PELVIS WITH CONTRAST TECHNIQUE: Multidetector CT imaging of the abdomen and pelvis was performed using the standard protocol following bolus administration of intravenous contrast. CONTRAST:  ISOVUE-300 IOPAMIDOL (ISOVUE-300) INJECTION 61% COMPARISON:  None. FINDINGS: Lower chest: No acute  abnormality. Hepatobiliary: No focal liver abnormality is seen. No gallstones, gallbladder wall thickening, or biliary dilatation. Pancreas: Unremarkable. No pancreatic ductal dilatation or surrounding inflammatory changes. Spleen: Normal in size without focal abnormality. Adrenals/Urinary Tract: Adrenal glands are unremarkable. Kidneys are normal, without renal calculi, focal lesion, or hydronephrosis. Bladder is well distended although some air is noted in the nondependent portion of the bladder. This may be related to recent instrumentation. Clinical correlation is recommended. Stomach/Bowel: Diverticular change of the colon is noted without evidence of diverticulitis. No obstructive nor inflammatory changes are seen. The appendix is well visualized and within normal limits. No inflammatory changes are noted. An area of ring enhancement is identified in the left mid abdomen within a loop of small bowel suggestive of intermittent intussusception. In the mid abdomen adjacent to a loop of small bowel there is a somewhat irregular 5.3 by 3.3 cm air-fluid collection consistent with focal perforation and abscess formation. This lies in close proximity to the area of intussusception. No other focal abnormality is seen. Vascular/Lymphatic: Aortic atherosclerosis. No enlarged abdominal or  pelvic lymph nodes. Reproductive: The uterus is within normal limits. Other: Minimal free pelvic fluid is noted. Musculoskeletal: No acute or significant osseous findings. IMPRESSION: Air-fluid collection adjacent to loop of small bowel consistent with an area of contained perforation. This lies in the mid abdomen and is best seen on image number 58 of series 2. In a small bowel loop adjacent to this there are changes consistent within intermittent intussusception. No obstructive changes are noted. Diverticular change without diverticulitis. Air within the bladder although likely related to instrumentation. Clinical correlation is recommended. Electronically Signed   By: Inez Catalina M.D.   On: 05/05/2016 21:36        Scheduled Meds: . lip balm      . ampicillin-sulbactam (UNASYN) IV  3 g Intravenous Q6H  . enoxaparin (LOVENOX) injection  40 mg Subcutaneous QHS  . insulin aspart  0-15 Units Subcutaneous Q4H  . insulin aspart  7 Units Subcutaneous Once   Continuous Infusions: . lactated ringers 75 mL/hr at 05/07/16 0438     LOS: 2 days       Tawni Millers, MD [facility] Pager [phone]  If 7PM-7AM, please contact night-coverage [email or web address] Password [email or web address] 05/07/2016, 1:36 PM

## 2016-05-08 ENCOUNTER — Inpatient Hospital Stay (HOSPITAL_COMMUNITY): Payer: Medicare HMO

## 2016-05-08 ENCOUNTER — Encounter (HOSPITAL_COMMUNITY): Payer: Self-pay | Admitting: Radiology

## 2016-05-08 DIAGNOSIS — K5710 Diverticulosis of small intestine without perforation or abscess without bleeding: Secondary | ICD-10-CM

## 2016-05-08 DIAGNOSIS — K56609 Unspecified intestinal obstruction, unspecified as to partial versus complete obstruction: Secondary | ICD-10-CM

## 2016-05-08 LAB — CBC WITH DIFFERENTIAL/PLATELET
Basophils Absolute: 0 10*3/uL (ref 0.0–0.1)
Basophils Relative: 0 %
EOS ABS: 0.1 10*3/uL (ref 0.0–0.7)
EOS PCT: 2 %
HCT: 28.7 % — ABNORMAL LOW (ref 36.0–46.0)
Hemoglobin: 9.6 g/dL — ABNORMAL LOW (ref 12.0–15.0)
LYMPHS PCT: 28 %
Lymphs Abs: 1.7 10*3/uL (ref 0.7–4.0)
MCH: 28.8 pg (ref 26.0–34.0)
MCHC: 33.4 g/dL (ref 30.0–36.0)
MCV: 86.2 fL (ref 78.0–100.0)
MONO ABS: 0.4 10*3/uL (ref 0.1–1.0)
Monocytes Relative: 7 %
Neutro Abs: 3.7 10*3/uL (ref 1.7–7.7)
Neutrophils Relative %: 63 %
PLATELETS: 218 10*3/uL (ref 150–400)
RBC: 3.33 MIL/uL — ABNORMAL LOW (ref 3.87–5.11)
RDW: 13.5 % (ref 11.5–15.5)
WBC: 5.9 10*3/uL (ref 4.0–10.5)

## 2016-05-08 LAB — BASIC METABOLIC PANEL
Anion gap: 12 (ref 5–15)
BUN: 23 mg/dL — AB (ref 6–20)
CALCIUM: 8.5 mg/dL — AB (ref 8.9–10.3)
CHLORIDE: 115 mmol/L — AB (ref 101–111)
CO2: 17 mmol/L — ABNORMAL LOW (ref 22–32)
Creatinine, Ser: 1.17 mg/dL — ABNORMAL HIGH (ref 0.44–1.00)
GFR calc Af Amer: 55 mL/min — ABNORMAL LOW (ref >60)
GFR, EST NON AFRICAN AMERICAN: 47 mL/min — AB (ref >60)
Glucose, Bld: 108 mg/dL — ABNORMAL HIGH (ref 65–99)
Potassium: 3.8 mmol/L (ref 3.5–5.1)
SODIUM: 144 mmol/L (ref 135–145)

## 2016-05-08 LAB — GLUCOSE, CAPILLARY
GLUCOSE-CAPILLARY: 98 mg/dL (ref 65–99)
GLUCOSE-CAPILLARY: 104 mg/dL — AB (ref 65–99)
GLUCOSE-CAPILLARY: 117 mg/dL — AB (ref 65–99)
GLUCOSE-CAPILLARY: 141 mg/dL — AB (ref 65–99)
Glucose-Capillary: 100 mg/dL — ABNORMAL HIGH (ref 65–99)

## 2016-05-08 MED ORDER — LIP MEDEX EX OINT
1.0000 "application " | TOPICAL_OINTMENT | Freq: Two times a day (BID) | CUTANEOUS | Status: DC
  Administered 2016-05-08 – 2016-05-15 (×10): 1 via TOPICAL

## 2016-05-08 MED ORDER — MAGIC MOUTHWASH
15.0000 mL | Freq: Four times a day (QID) | ORAL | Status: DC | PRN
  Filled 2016-05-08: qty 15

## 2016-05-08 MED ORDER — IOPAMIDOL (ISOVUE-300) INJECTION 61%
INTRAVENOUS | Status: AC
  Filled 2016-05-08: qty 100

## 2016-05-08 MED ORDER — GABAPENTIN 300 MG PO CAPS
300.0000 mg | ORAL_CAPSULE | ORAL | Status: AC
  Administered 2016-05-09: 300 mg via ORAL
  Filled 2016-05-08: qty 1

## 2016-05-08 MED ORDER — BISACODYL 10 MG RE SUPP: 10.0000 mg | Freq: Two times a day (BID) | RECTAL | Status: DC | PRN

## 2016-05-08 MED ORDER — HYDRALAZINE HCL 20 MG/ML IJ SOLN: 5.0000 mg | Freq: Four times a day (QID) | INTRAMUSCULAR | Status: DC | PRN

## 2016-05-08 MED ORDER — DEXTROSE 5 % IV SOLN: 2.0000 g | INTRAVENOUS | Status: DC

## 2016-05-08 MED ORDER — LACTATED RINGERS IV BOLUS (SEPSIS): 1000.0000 mL | Freq: Three times a day (TID) | INTRAVENOUS | Status: AC | PRN

## 2016-05-08 MED ORDER — ACETAMINOPHEN 500 MG PO TABS
1000.0000 mg | ORAL_TABLET | ORAL | Status: AC
  Administered 2016-05-09: 1000 mg via ORAL
  Filled 2016-05-08: qty 2

## 2016-05-08 MED ORDER — METOCLOPRAMIDE HCL 5 MG/ML IJ SOLN: 5.0000 mg | Freq: Four times a day (QID) | INTRAMUSCULAR | Status: DC | PRN

## 2016-05-08 MED ORDER — DEXTROSE 5 % IV SOLN
2.0000 g | INTRAVENOUS | Status: DC
  Filled 2016-05-08: qty 2

## 2016-05-08 MED ORDER — ACETAMINOPHEN 650 MG RE SUPP: 650.0000 mg | Freq: Four times a day (QID) | RECTAL | Status: DC | PRN

## 2016-05-08 MED ORDER — ONDANSETRON HCL 4 MG/2ML IJ SOLN
4.0000 mg | Freq: Four times a day (QID) | INTRAMUSCULAR | Status: DC | PRN
  Administered 2016-05-09: 4 mg via INTRAVENOUS
  Filled 2016-05-08: qty 2

## 2016-05-08 MED ORDER — IOPAMIDOL (ISOVUE-300) INJECTION 61%
INTRAVENOUS | Status: AC
  Administered 2016-05-08: 15 mL via ORAL
  Filled 2016-05-08: qty 30

## 2016-05-08 MED ORDER — HYDROMORPHONE HCL 1 MG/ML IJ SOLN: 0.5000 mg | INTRAMUSCULAR | Status: DC | PRN

## 2016-05-08 MED ORDER — IOPAMIDOL (ISOVUE-300) INJECTION 61%
100.0000 mL | Freq: Once | INTRAVENOUS | Status: AC | PRN
  Administered 2016-05-08: 100 mL via INTRAVENOUS

## 2016-05-08 MED ORDER — DIPHENHYDRAMINE HCL 50 MG/ML IJ SOLN: 12.5000 mg | Freq: Four times a day (QID) | INTRAMUSCULAR | Status: DC | PRN

## 2016-05-08 MED ORDER — ACETAMINOPHEN 325 MG PO TABS: 325.0000 mg | ORAL_TABLET | Freq: Four times a day (QID) | ORAL | Status: DC | PRN

## 2016-05-08 MED ORDER — IOPAMIDOL (ISOVUE-300) INJECTION 61%
30.0000 mL | Freq: Once | INTRAVENOUS | Status: AC | PRN
Start: 2016-05-08 — End: 2016-05-08
  Administered 2016-05-08 (×2): 15 mL via ORAL
  Filled 2016-05-08 (×2): qty 30

## 2016-05-08 MED ORDER — MENTHOL 3 MG MT LOZG
1.0000 | LOZENGE | OROMUCOSAL | Status: DC | PRN
  Administered 2016-05-10: 3 mg via ORAL
  Filled 2016-05-08: qty 9

## 2016-05-08 MED ORDER — PROCHLORPERAZINE EDISYLATE 5 MG/ML IJ SOLN: 5.0000 mg | INTRAMUSCULAR | Status: DC | PRN

## 2016-05-08 MED ORDER — PHENOL 1.4 % MT LIQD
2.0000 | OROMUCOSAL | Status: DC | PRN
  Filled 2016-05-08: qty 177

## 2016-05-08 MED ORDER — METOPROLOL TARTRATE 5 MG/5ML IV SOLN: 5.0000 mg | Freq: Four times a day (QID) | INTRAVENOUS | Status: DC | PRN

## 2016-05-08 MED ORDER — SODIUM CHLORIDE 0.9 % IV SOLN
8.0000 mg | Freq: Four times a day (QID) | INTRAVENOUS | Status: DC | PRN
  Filled 2016-05-08: qty 4

## 2016-05-08 MED ORDER — DEXTROSE 5 % IV SOLN
2.0000 g | INTRAVENOUS | Status: DC
  Administered 2016-05-08: 2 g via INTRAVENOUS
  Filled 2016-05-08: qty 2

## 2016-05-08 NOTE — Progress Notes (Signed)
Bailey [name]  17-Aug-1950 [id number]  Patient Care Team: No Pcp Per Patient as PCP - General (General Practice)  This patient is a 66 y.o.female who calls today for surgical evaluation.   Past evaluate by Dr. Delana Meyer as I am coming on call this weekend.  Patient with abdominal distention and intermittent pain.  Repeat CT scan more suggestive of intraluminal intestinal mass causing bowel obstruction.  Patient did have some emesis with the oral contrast but feeling better now.  Not having any flatus or bowel movements.  Would like to avoid a nasogastric tube if possible.  No abdominal pain.  Discussed with the patient that she has good transition zone with worsening distention and ascites.  No evidence of perforation or abscess at this time.  I think she would benefit from abdominal exploration with probable bowel resection of area of transition.  Do not know if this is a tumor, atypical diverticulum causing obstruction or perforation.  We will start lap assisted depending on how much can be done.  She is distended but rather soft.  There are risks to surgery but she understands these risks and wishes to proceed.  She is not toxic so we will do this first thing in the morning.  The anatomy & physiology of the digestive tract was discussed.  The pathophysiology of perforation was discussed.  Differential diagnosis such as perforated ulcer or colon, etc was discussed.   Natural history risks without surgery such as death was discussed.  I recommended abdominal exploration to diagnose & treat the source of the problem.  Laparoscopic & open techniques were discussed.   Risks such as bleeding, infection, abscess, leak, reoperation, bowel resection, possible ostomy, injury to other organs, need for repair of tissues / organs, hernia, heart attack, death, and other risks were discussed.   The risks of no intervention will lead to serious problems including death.   I expressed a good likelihood that surgery will  address the problem.    Goals of post-operative recovery were discussed as well.  We will work to minimize complications although risks in an emergent setting are high.   Questions were answered.  The patient expressed understanding & wishes to proceed with surgery.       General: Pt awake/alert/oriented x4 in no major acute distress Eyes: PERRL, normal EOM. Sclera nonicteric Neuro: CN II-XII intact w/o focal sensory/motor deficits. Lymph: No head/neck/groin lymphadenopathy Psych:  No delerium/psychosis/paranoia HENT: Normocephalic, Mucus membranes moist.  No thrush Neck: Supple, No tracheal deviation Chest: No pain.  Good respiratory excursion. CV:  Pulses intact.  Regular rhythm MS: Normal AROM mjr joints.  No obvious deformity Abdomen: Soft, moderately distended.  Nontender.  No incarcerated hernias. Ext:  SCDs BLE.  No significant edema.  No cyanosis Skin: No petechiae / purpura   Patient Active Problem List   Diagnosis Date Noted  . SBO (small bowel obstruction) 05/08/2016  . Diverticular disease of jejunum 05/08/2016  . UTI (urinary tract infection) 05/06/2016  . Dehydration   . Perforated viscus   . Mass of small intestine 05/05/2016  . Normocytic anemia 05/05/2016  . Diabetes mellitus without complication [facility])     Past Medical History:  Diagnosis Date  . Diabetes mellitus     Past Surgical History:  Procedure Laterality Date  . INCISION AND DRAINAGE ABSCESS Left 02/11/2014   Procedure: INCISION AND DRAINAGE ABSCESS Left Buttock;  Surgeon: Jackolyn Confer, MD;  Location: [facility];  Service: General;  Laterality: Left;  . TOE AMPUTATION  left foot great toe    Social History   Social History  . Marital status: Married    Spouse name: N/A  . Number of children: N/A  . Years of education: N/A   Occupational History  . Not on file.   Social History Main Topics  . Smoking status: Never Smoker  . Smokeless tobacco: Never Used  . Alcohol use No  . Drug  use: No  . Sexual activity: Not on file   Other Topics Concern  . Not on file   Social History Narrative  . No narrative on file    Family History  Problem Relation Age of Onset  . Diabetes Mother   . Asthma Sister     Current Facility-Administered Medications  Medication Dose Route Frequency Provider Last Rate Last Dose  . acetaminophen (TYLENOL) suppository 650 mg  650 mg Rectal Q6H PRN Michael Boston, MD      . Derrill Memo ON 05/09/2016] acetaminophen (TYLENOL) tablet 1,000 mg  1,000 mg Oral On Call to OR Michael Boston, MD      . acetaminophen (TYLENOL) tablet 325-650 mg  325-650 mg Oral Q6H PRN Michael Boston, MD      . bisacodyl (DULCOLAX) suppository 10 mg  10 mg Rectal Q12H PRN Michael Boston, MD      . Derrill Memo ON 05/09/2016] cefoTEtan (CEFOTAN) 2 g in dextrose 5 % 50 mL IVPB  2 g Intravenous On Call to Pierre Part, MD      . cefTRIAXone (ROCEPHIN) 2 g in dextrose 5 % 50 mL IVPB  2 g Intravenous Q24H Michael Boston, MD      . diphenhydrAMINE (BENADRYL) injection 12.5-25 mg  12.5-25 mg Intravenous Q6H PRN Michael Boston, MD      . enoxaparin (LOVENOX) injection 40 mg  40 mg Subcutaneous QHS Vianne Bulls, MD   40 mg at 05/08/16 2103  . [START ON 05/09/2016] gabapentin (NEURONTIN) capsule 300 mg  300 mg Oral On Call to [employer], MD      . hydrALAZINE (APRESOLINE) injection 5-20 mg  5-20 mg Intravenous Q6H PRN Michael Boston, MD      . HYDROmorphone (DILAUDID) injection 0.5-2 mg  0.5-2 mg Intravenous Q2H PRN Michael Boston, MD      . insulin aspart (novoLOG) injection 0-15 Units  0-15 Units Subcutaneous Q4H Vianne Bulls, MD   2 Units at 05/08/16 2104  . insulin aspart (novoLOG) injection 7 Units  7 Units Subcutaneous Once Ilene Qua Opyd, MD      . iopamidol (ISOVUE-300) 61 % injection           . lactated ringers bolus 1,000 mL  1,000 mL Intravenous Q8H PRN Michael Boston, MD      . lactated ringers infusion   Intravenous Continuous Michael Boston, MD 75 mL/hr at 05/08/16 2103    . lip balm  (CARMEX) ointment 1 application  1 application Topical BID Michael Boston, MD      . magic mouthwash  15 mL Oral QID PRN Michael Boston, MD      . menthol-cetylpyridinium (CEPACOL) lozenge 3 mg  1 lozenge Oral PRN Michael Boston, MD      . metoCLOPramide (REGLAN) injection 5-10 mg  5-10 mg Intravenous Q6H PRN Michael Boston, MD      . metoprolol (LOPRESSOR) injection 5 mg  5 mg Intravenous Q6H PRN Michael Boston, MD      . morphine 4 MG/ML injection 1-3 mg  1-3 mg Intravenous Q2H PRN Mauricio  Gerome Apley, MD   2 mg at 05/08/16 0226  . ondansetron (ZOFRAN) injection 4 mg  4 mg Intravenous Q6H PRN Michael Boston, MD       Or  . ondansetron (ZOFRAN) 8 mg in sodium chloride 0.9 % 50 mL IVPB  8 mg Intravenous Q6H PRN Michael Boston, MD      . ondansetron [facility]) tablet 4 mg  4 mg Oral Q6H PRN Vianne Bulls, MD      . phenol (CHLORASEPTIC) mouth spray 2 spray  2 spray Mouth/Throat PRN Michael Boston, MD      . prochlorperazine (COMPAZINE) injection 5-10 mg  5-10 mg Intravenous Q4H PRN Michael Boston, MD         No Known Allergies  BP 136/64 (BP Location: Right Arm)   Pulse 76   Temp 97.6 F (36.4 C) (Oral)   Resp 16   Ht $R'5\' 4"'tF$  (1.626 m)   Wt 66.6 kg (146 lb 13.2 oz)   SpO2 96%   BMI 25.20 kg/m   Ct Abdomen Pelvis W Contrast  Result Date: 05/08/2016 CLINICAL DATA:  Contained small bowel perforation seen on previous CT. EXAM: CT ABDOMEN AND PELVIS WITH CONTRAST TECHNIQUE: Multidetector CT imaging of the abdomen and pelvis was performed using the standard protocol following bolus administration of intravenous contrast. CONTRAST:  166mL ISOVUE-300 IOPAMIDOL (ISOVUE-300) INJECTION 61% COMPARISON:  05/05/2016 FINDINGS: Lower chest:  Subsegmental atelectasis noted lung bases. Hepatobiliary: No focal abnormality within the liver parenchyma. Gallbladder is markedly distended. No intrahepatic or extrahepatic biliary dilation. Pancreas: Pancreas diffusely atrophic. Spleen: No splenomegaly. No focal mass lesion.  Adrenals/Urinary Tract: No adrenal nodule or mass. Kidneys unremarkable. No evidence for hydroureter. Bladder is decompressed. Gas in the bladder lumen presumably secondary to recent instrumentation. Stomach/Bowel: Stomach is moderately distended. Large duodenal diverticulum evident. In the interval since prior study, small-bowel loops have become diffusely fluid filled and dilated, measuring up to 3.6 cm diameter. On today's exam, multiple large diverticuli of the small bowel are seen in the proximal small bowel loops. The area that appeared to represent contained abscess on the previous study is no longer present. However, the area on the prior study felt to represent intussusception is now seen just to the right and caudal of the umbilicus and represents a primary small bowel lesion. This lesion is the site of small bowel obstruction with loops distal to this location being decompressed. The terminal ileum is normal. The appendix is normal. The colon shows diffuse diverticular change and is collapsed along its entire length. Vascular/Lymphatic: No abdominal aortic aneurysm. The portal vein and superior mesenteric vein are patent. Splenic vein is patent. There is no gastrohepatic or hepatoduodenal ligament lymphadenopathy. No intraperitoneal or retroperitoneal lymphadenopathy. No pelvic sidewall lymphadenopathy. Reproductive: The uterus has normal CT imaging appearance. There is no adnexal mass. Other: Free intraperitoneal fluid is identified around the liver, spleen, in the para colic gutters, and in the central pelvis. Since the prior study, the patient has developed fairly prominent interloop mesenteric fluid associated with the dilated small bowel loops. Musculoskeletal: Bone windows reveal no worrisome lytic or sclerotic osseous lesions. IMPRESSION: 1. In the interval since the prior study, the patient has developed a small bowel obstruction secondary to an intrinsic small-bowel lesion located in the mid  small bowel, near the jejunal ileal junction probably in the proximal to mid ileum. This lesion is visible on image 64 of series 2 today, represents circumferential wall thickening along a length of about 3 cm and is  just deep to the rectus fascia to the right and caudal of the umbilicus. Small bowel distal to this lesion is decompressed as is the entire colon. 2. Patient is noted to have multiple relatively large jejunal diverticuli. As there is no residual of the apparent contained perforation seen on the prior study, this appearance on the prior study was probably related to a large debris filled small bowel diverticulum. 3. Interval development of small to moderate volume interloop mesenteric and free intraperitoneal fluid. 4. Diffuse colonic diverticulosis. I personally discussed these findings by telephone with Dr. Cathlean Sauer at approximately 10:50 a.m. on 05/08/2016. Electronically Signed   By: Misty Stanley M.D.   On: 05/08/2016 10:54   Ct Abdomen Pelvis W Contrast  Result Date: 05/05/2016 CLINICAL DATA:  Lower abdominal pain EXAM: CT ABDOMEN AND PELVIS WITH CONTRAST TECHNIQUE: Multidetector CT imaging of the abdomen and pelvis was performed using the standard protocol following bolus administration of intravenous contrast. CONTRAST:  115mL ISOVUE-300 IOPAMIDOL (ISOVUE-300) INJECTION 61% COMPARISON:  None. FINDINGS: Lower chest: No acute abnormality. Hepatobiliary: No focal liver abnormality is seen. No gallstones, gallbladder wall thickening, or biliary dilatation. Pancreas: Unremarkable. No pancreatic ductal dilatation or surrounding inflammatory changes. Spleen: Normal in size without focal abnormality. Adrenals/Urinary Tract: Adrenal glands are unremarkable. Kidneys are normal, without renal calculi, focal lesion, or hydronephrosis. Bladder is well distended although some air is noted in the nondependent portion of the bladder. This may be related to recent instrumentation. Clinical correlation is  recommended. Stomach/Bowel: Diverticular change of the colon is noted without evidence of diverticulitis. No obstructive nor inflammatory changes are seen. The appendix is well visualized and within normal limits. No inflammatory changes are noted. An area of ring enhancement is identified in the left mid abdomen within a loop of small bowel suggestive of intermittent intussusception. In the mid abdomen adjacent to a loop of small bowel there is a somewhat irregular 5.3 by 3.3 cm air-fluid collection consistent with focal perforation and abscess formation. This lies in close proximity to the area of intussusception. No other focal abnormality is seen. Vascular/Lymphatic: Aortic atherosclerosis. No enlarged abdominal or pelvic lymph nodes. Reproductive: The uterus is within normal limits. Other: Minimal free pelvic fluid is noted. Musculoskeletal: No acute or significant osseous findings. IMPRESSION: Air-fluid collection adjacent to loop of small bowel consistent with an area of contained perforation. This lies in the mid abdomen and is best seen on image number 58 of series 2. In a small bowel loop adjacent to this there are changes consistent within intermittent intussusception. No obstructive changes are noted. Diverticular change without diverticulitis. Air within the bladder although likely related to instrumentation. Clinical correlation is recommended. Electronically Signed   By: Inez Catalina M.D.   On: 05/05/2016 21:36    Note: This dictation was prepared with Dragon/digital dictation along with [employer]. Any transcriptional errors that result from this process are unintentional.   .Adin Hector, M.D., F.A.C.S. Gastrointestinal and Minimally Invasive Surgery [facility] [address] [place], [place] [address] [phone] Main / Paging  05/08/2016 9:44 PM

## 2016-05-08 NOTE — Progress Notes (Signed)
Patient ID: Bailey [name], female   DOB: [birth year], 66 y.o.   MRN: [id number]  [facility] Surgery Progress Note     Subjective: Sitting up in bed drinking oral contrast for CT scan. Lower abdomen still a little sore, but patient states that she continues to feel better. No BM. Denies n/v.  Objective: Vital signs in last 24 hours: Temp:  [97.8 F (36.6 C)-98.2 F (36.8 C)] 97.8 F (36.6 C) (03/16 0426) Pulse Rate:  [71-80] 71 (03/16 0426) Resp:  [16-18] 16 (03/16 0426) BP: (126-143)/(57-67) 126/60 (03/16 0426) SpO2:  [96 %-99 %] 96 % (03/16 0426) Last BM Date: 05/04/16  Intake/Output from previous day: 03/15 0701 - 03/16 0700 In: 850 [I.V.:750; IV Piggyback:100] Out: 1750 [Urine:1750] Intake/Output this shift: No intake/output data recorded.  PE: Gen: Alert, NAD, pleasant Pulm: effort normal Abd: Soft, ND, +BS, no HSM, mild left and right lower abdominal tenderness, no rebound or guarding Ext: No erythema, edema, or tenderness   Lab Results:   Recent Labs  05/07/16 0654 05/08/16 0421  WBC 5.9 5.9  HGB 9.1* 9.6*  HCT 28.0* 28.7*  PLT 181 218   BMET  Recent Labs  05/07/16 0448 05/08/16 0421  NA 142 144  K 3.9 3.8  CL 117* 115*  CO2 16* 17*  GLUCOSE 117* 108*  BUN 22* 23*  CREATININE 1.00 1.17*  CALCIUM 8.2* 8.5*   PT/INR  Recent Labs  05/06/16 0450  LABPROT 15.6*  INR 1.23   CMP     Component Value Date/Time   NA 144 05/08/2016 0421   K 3.8 05/08/2016 0421   CL 115 (H) 05/08/2016 0421   CO2 17 (L) 05/08/2016 0421   GLUCOSE 108 (H) 05/08/2016 0421   BUN 23 (H) 05/08/2016 0421   CREATININE 1.17 (H) 05/08/2016 0421   CREATININE 0.74 02/10/2014 1220   CALCIUM 8.5 (L) 05/08/2016 0421   PROT 6.1 (L) 05/06/2016 0450   ALBUMIN 2.8 (L) 05/06/2016 0450   AST 10 (L) 05/06/2016 0450   ALT 9 (L) 05/06/2016 0450   ALKPHOS 90 05/06/2016 0450   BILITOT 0.6 05/06/2016 0450   GFRNONAA 47 (L) 05/08/2016 0421   GFRAA 55 (L) 05/08/2016 0421    Lipase     Component Value Date/Time   LIPASE 15 05/05/2016 1923       Studies/Results: No results found.  Anti-infectives: Anti-infectives    Start     Dose/Rate Route Frequency Ordered Stop   05/06/16 0600  Ampicillin-Sulbactam (UNASYN) 3 g in sodium chloride 0.9 % 100 mL IVPB     3 g 200 mL/hr over 30 Minutes Intravenous Every 6 hours 05/06/16 0220     05/05/16 2215  piperacillin-tazobactam (ZOSYN) IVPB 3.375 g     3.375 g 100 mL/hr over 30 Minutes Intravenous  Once 05/05/16 2211 05/05/16 2303       Assessment/Plan Bowel perforation and intussusception, unclear etiology  - ?possible small bowel diverticulum, foreign body perforation or potentially tumor? - no prior h/o abdominal surgery - acute onset mid-abdominal pain - WBC 5.9 today, VSS  DM poorly controlled Chronic normocytic anemia UTI  ID- unasyn 3/14>> FEN - IVF, NPO VTE - SCDs, lovenox  Plan - CT scan planned for this morning. Continue IV antibiotics and bowel rest. Will make further recommendations once CT scan is complete.   LOS: 3 days    Jerrye Beavers , [facility] Surgery 05/08/2016, 8:57 AM Pager: [phone] Consults: [phone] Mon-Fri 7:00 am-4:30 pm Sat-Sun 7:00 am-11:30  am

## 2016-05-08 NOTE — Progress Notes (Signed)
Pharmacy Antibiotic Note  Bailey [name] is a 66 y.o. female admitted on 05/05/2016 with Intra-abdominal infection.  Pharmacy has been consulted for Unasyn dosing.  Plan: Day 3 Antibiotics Continue Unasyn 3g IV q6hr Recommend change to Augmentin when appropriate  Height: $Remove'5\' 4"'BBaWXoW$  (162.6 cm) Weight: 146 lb 13.2 oz (66.6 kg) IBW/kg (Calculated) : 54.7  Temp (24hrs), Avg:98 F (36.7 C), Min:97.8 F (36.6 C), Max:98.2 F (36.8 C)   Recent Labs Lab 05/05/16 1923 05/06/16 0450 05/07/16 0448 05/07/16 0654 05/08/16 0421  WBC 10.3 8.3  --  5.9 5.9  CREATININE 0.95 0.97 1.00  --  1.17*    Estimated Creatinine Clearance: 43.8 mL/min (A) (by C-G formula based on SCr of 1.17 mg/dL (H)).    No Known Allergies  Antimicrobials this admission: Zosyn 3/13 x1 Unasyn 3/14 >>  Dose adjustments this admission: -  Microbiology results: 3/13 BCx: ngtd 3/13 UCx: sent - multiple species suggest recollect  Thank you for allowing pharmacy to be a part of this patient's care.  Adrian Saran, PharmD, BCPS Pager [phone] 05/08/2016 9:55 AM

## 2016-05-08 NOTE — Progress Notes (Addendum)
PROGRESS NOTE    INA SCRIVENS  HAF:[id number] DOB: 09-05-1950 DOA: 05/05/2016 PCP: No PCP Per Patient    Brief Narrative:  66 yo female with past medical history of T2dm, presented with abdominal pain, constant and severe. On the initial physical examination, she was found dehydrated, tender abdomen to palpation, CT positive for small bowel perforation. Surgery consulted with recommendations for conservative care. Started on IV antibiotics and IV fluids, NPO. Follow CT with signs of bowel obstruction, large jejunal diverticulosis with no signs of perforation.    Assessment & Plan:   Principal Problem:   Small bowel perforation (HCC) Active Problems:   Diabetes mellitus without complication (HCC)   Normocytic anemia   UTI (urinary tract infection)   Dehydration   Perforated viscus   1. Small bowel obstruction. Continue supportive medical therapy with antibiotics Unasyn #2 and IV fluids. CT abdomen and pelvis with signs of small bowel obstruction. Will continue npo and will follow on surgery recommendations. No significant abdominal distention, no nausea or vomiting, will hold on NG tube for now.   2. T2DM. Patient npo, glucose cover and monitoring with insulin sliding scale, capillary glucose 98, 104, 117.   3. Anemia. Hb and hct stable at 9.6.  4. Urine infection. Culture with poly-bacterial. Will continue antibiotic therapy with unasyn.    DVT prophylaxis:enoxaparin  Code Status:full  Family Communication:No family at the bedside  Disposition Plan:Home   Consultants:  Surgery   Procedures:    Antimicrobials:   Unasyn  Subjective: Persistent abdominal pain, at the lower abdomen, no nausea or vomiting, not passing gas or stools. No chest pain or dyspnea.   Objective: Vitals:   05/07/16 2120 05/08/16 0426 05/08/16 1100 05/08/16 1431  BP: (!) 143/57 126/60 131/61 124/66  Pulse: 76 71 73 80  Resp: $Remo'16 16 16 16  'rxdcX$ Temp: 98 F (36.7 C) 97.8 F (36.6  C) 98 F (36.7 C) 98.4 F (36.9 C)  TempSrc: Oral Oral Oral Oral  SpO2: 99% 96% 100% 98%  Weight:      Height:        Intake/Output Summary (Last 24 hours) at 05/08/16 1620 Last data filed at 05/08/16 1600  Gross per 24 hour  Intake             1582 ml  Output             2000 ml  Net             -418 ml   Filed Weights   05/05/16 2219 05/06/16 0500 05/07/16 0500  Weight: 63.5 kg (140 lb) 63.5 kg (139 lb 15.9 oz) 66.6 kg (146 lb 13.2 oz)    Examination:  General exam: no dyspnea. E ENT: Mild pallor, no icterus.   Respiratory system: Clear to auscultation. Respiratory effort normal. No wheezing or rhonchi.  Cardiovascular system: S1 & S2 heard, RRR. No JVD, murmurs, rubs, gallops or clicks. No pedal edema. Gastrointestinal system: Abdomen distended and tender. No organomegaly or masses felt. Normal bowel sounds heard. Central nervous system: Alert and oriented. No focal neurological deficits. Extremities: Symmetric 5 x 5 power. Skin: No rashes, lesions or ulcers     Data Reviewed: I have personally reviewed following labs and imaging studies  CBC:  Recent Labs Lab 05/05/16 1923 05/06/16 0450 05/07/16 0654 05/08/16 0421  WBC 10.3 8.3 5.9 5.9  NEUTROABS  --   --   --  3.7  HGB 10.5* 9.2* 9.1* 9.6*  HCT 31.9* 28.1* 28.0* 28.7*  MCV 85.5 85.4 86.4 86.2  PLT 189 185 181 846   Basic Metabolic Panel:  Recent Labs Lab 05/05/16 1923 05/06/16 0450 05/07/16 0448 05/08/16 0421  NA 137 134* 142 144  K 4.5 3.8 3.9 3.8  CL 111 110 117* 115*  CO2 19* 17* 16* 17*  GLUCOSE 298* 186* 117* 108*  BUN 27* 24* 22* 23*  CREATININE 0.95 0.97 1.00 1.17*  CALCIUM 9.0 7.9* 8.2* 8.5*   GFR: Estimated Creatinine Clearance: 43.8 mL/min (A) (by C-G formula based on SCr of 1.17 mg/dL (H)). Liver Function Tests:  Recent Labs Lab 05/05/16 1923 05/06/16 0450  AST 11* 10*  ALT 11* 9*  ALKPHOS 115 90  BILITOT 0.7 0.6  PROT 6.9 6.1*  ALBUMIN 3.3* 2.8*    Recent Labs Lab  05/05/16 1923  LIPASE 15   No results for input(s): AMMONIA in the last 168 hours. Coagulation Profile:  Recent Labs Lab 05/06/16 0450  INR 1.23   Cardiac Enzymes: No results for input(s): CKTOTAL, CKMB, CKMBINDEX, TROPONINI in the last 168 hours. BNP (last 3 results) No results for input(s): PROBNP in the last 8760 hours. HbA1C:  Recent Labs  05/06/16 0004  HGBA1C 10.2*   CBG:  Recent Labs Lab 05/07/16 2344 05/08/16 0357 05/08/16 0810 05/08/16 1159 05/08/16 1612  GLUCAP 101* 100* 98 104* 117*   Lipid Profile: No results for input(s): CHOL, HDL, LDLCALC, TRIG, CHOLHDL, LDLDIRECT in the last 72 hours. Thyroid Function Tests: No results for input(s): TSH, T4TOTAL, FREET4, T3FREE, THYROIDAB in the last 72 hours. Anemia Panel: No results for input(s): VITAMINB12, FOLATE, FERRITIN, TIBC, IRON, RETICCTPCT in the last 72 hours. Sepsis Labs: No results for input(s): PROCALCITON, LATICACIDVEN in the last 168 hours.  Recent Results (from the past 240 hour(s))  Blood culture (routine x 2)     Status: None (Preliminary result)   Collection Time: 05/05/16 10:15 PM  Result Value Ref Range Status   Specimen Description BLOOD LEFT ANTECUBITAL  Final   Special Requests BOTTLES DRAWN AEROBIC AND ANAEROBIC 5 ML EA  Final   Culture   Final    NO GROWTH 3 DAYS Performed at [facility], [address] [place], [place] [address]    Report Status PENDING  Incomplete  Blood culture (routine x 2)     Status: None (Preliminary result)   Collection Time: 05/05/16 10:25 PM  Result Value Ref Range Status   Specimen Description BLOOD L WRIST  Final   Special Requests IN PEDIATRIC BOTTLE 2ML  Final   Culture   Final    NO GROWTH 3 DAYS Performed at [facility], [place] [address], [place], [place] [address]    Report Status PENDING  Incomplete  Urine culture     Status: Abnormal   Collection Time: 05/05/16 10:27 PM  Result Value Ref Range Status   Specimen Description  URINE, RANDOM  Final   Special Requests NONE  Final   Culture MULTIPLE SPECIES PRESENT, SUGGEST RECOLLECTION (A)  Final   Report Status 05/07/2016 FINAL  Final         Radiology Studies: Ct Abdomen Pelvis W Contrast  Result Date: 05/08/2016 CLINICAL DATA:  Contained small bowel perforation seen on previous CT. EXAM: CT ABDOMEN AND PELVIS WITH CONTRAST TECHNIQUE: Multidetector CT imaging of the abdomen and pelvis was performed using the standard protocol following bolus administration of intravenous contrast. CONTRAST:  112mL ISOVUE-300 IOPAMIDOL (ISOVUE-300) INJECTION 61% COMPARISON:  05/05/2016 FINDINGS: Lower chest:  Subsegmental atelectasis  noted lung bases. Hepatobiliary: No focal abnormality within the liver parenchyma. Gallbladder is markedly distended. No intrahepatic or extrahepatic biliary dilation. Pancreas: Pancreas diffusely atrophic. Spleen: No splenomegaly. No focal mass lesion. Adrenals/Urinary Tract: No adrenal nodule or mass. Kidneys unremarkable. No evidence for hydroureter. Bladder is decompressed. Gas in the bladder lumen presumably secondary to recent instrumentation. Stomach/Bowel: Stomach is moderately distended. Large duodenal diverticulum evident. In the interval since prior study, small-bowel loops have become diffusely fluid filled and dilated, measuring up to 3.6 cm diameter. On today's exam, multiple large diverticuli of the small bowel are seen in the proximal small bowel loops. The area that appeared to represent contained abscess on the previous study is no longer present. However, the area on the prior study felt to represent intussusception is now seen just to the right and caudal of the umbilicus and represents a primary small bowel lesion. This lesion is the site of small bowel obstruction with loops distal to this location being decompressed. The terminal ileum is normal. The appendix is normal. The colon shows diffuse diverticular change and is collapsed along its  entire length. Vascular/Lymphatic: No abdominal aortic aneurysm. The portal vein and superior mesenteric vein are patent. Splenic vein is patent. There is no gastrohepatic or hepatoduodenal ligament lymphadenopathy. No intraperitoneal or retroperitoneal lymphadenopathy. No pelvic sidewall lymphadenopathy. Reproductive: The uterus has normal CT imaging appearance. There is no adnexal mass. Other: Free intraperitoneal fluid is identified around the liver, spleen, in the para colic gutters, and in the central pelvis. Since the prior study, the patient has developed fairly prominent interloop mesenteric fluid associated with the dilated small bowel loops. Musculoskeletal: Bone windows reveal no worrisome lytic or sclerotic osseous lesions. IMPRESSION: 1. In the interval since the prior study, the patient has developed a small bowel obstruction secondary to an intrinsic small-bowel lesion located in the mid small bowel, near the jejunal ileal junction probably in the proximal to mid ileum. This lesion is visible on image 64 of series 2 today, represents circumferential wall thickening along a length of about 3 cm and is just deep to the rectus fascia to the right and caudal of the umbilicus. Small bowel distal to this lesion is decompressed as is the entire colon. 2. Patient is noted to have multiple relatively large jejunal diverticuli. As there is no residual of the apparent contained perforation seen on the prior study, this appearance on the prior study was probably related to a large debris filled small bowel diverticulum. 3. Interval development of small to moderate volume interloop mesenteric and free intraperitoneal fluid. 4. Diffuse colonic diverticulosis. I personally discussed these findings by telephone with Dr. Cathlean Sauer at approximately 10:50 a.m. on 05/08/2016. Electronically Signed   By: Misty Stanley M.D.   On: 05/08/2016 10:54        Scheduled Meds: . ampicillin-sulbactam (UNASYN) IV  3 g  Intravenous Q6H  . enoxaparin (LOVENOX) injection  40 mg Subcutaneous QHS  . insulin aspart  0-15 Units Subcutaneous Q4H  . insulin aspart  7 Units Subcutaneous Once  . iopamidol       Continuous Infusions: . lactated ringers 75 mL/hr at 05/08/16 1975     LOS: 3 days      Aydn Ferrara Gerome Apley, MD [facility]   If 7PM-7AM, please contact night-coverage [email or web address] Password [email or web address] 05/08/2016, 4:20 PM

## 2016-05-09 ENCOUNTER — Inpatient Hospital Stay (HOSPITAL_COMMUNITY): Payer: Medicare HMO | Admitting: Anesthesiology

## 2016-05-09 ENCOUNTER — Encounter (HOSPITAL_COMMUNITY): Payer: Self-pay | Admitting: Anesthesiology

## 2016-05-09 ENCOUNTER — Encounter (HOSPITAL_COMMUNITY)
Admission: EM | Disposition: A | Discharge status: Home / Self Care | Payer: Self-pay | Source: Home / Self Care | Attending: Internal Medicine

## 2016-05-09 DIAGNOSIS — K5710 Diverticulosis of small intestine without perforation or abscess without bleeding: Principal | ICD-10-CM

## 2016-05-09 DIAGNOSIS — N179 Acute kidney failure, unspecified: Secondary | ICD-10-CM

## 2016-05-09 DIAGNOSIS — K6389 Other specified diseases of intestine: Secondary | ICD-10-CM

## 2016-05-09 HISTORY — PX: LAPAROSCOPIC SMALL BOWEL RESECTION: SHX5929

## 2016-05-09 HISTORY — PX: LAPAROSCOPY: SHX197

## 2016-05-09 LAB — CBC WITH DIFFERENTIAL/PLATELET
Basophils Absolute: 0 10*3/uL (ref 0.0–0.1)
Basophils Relative: 0 %
Eosinophils Absolute: 0.1 10*3/uL (ref 0.0–0.7)
Eosinophils Relative: 1 %
HEMATOCRIT: 30.2 % — AB (ref 36.0–46.0)
HEMOGLOBIN: 10.2 g/dL — AB (ref 12.0–15.0)
LYMPHS ABS: 1.6 10*3/uL (ref 0.7–4.0)
LYMPHS PCT: 21 %
MCH: 29.4 pg (ref 26.0–34.0)
MCHC: 33.8 g/dL (ref 30.0–36.0)
MCV: 87 fL (ref 78.0–100.0)
Monocytes Absolute: 0.4 10*3/uL (ref 0.1–1.0)
Monocytes Relative: 5 %
NEUTROS PCT: 73 %
Neutro Abs: 5.4 10*3/uL (ref 1.7–7.7)
Platelets: 285 10*3/uL (ref 150–400)
RBC: 3.47 MIL/uL — AB (ref 3.87–5.11)
RDW: 13.6 % (ref 11.5–15.5)
WBC: 7.5 10*3/uL (ref 4.0–10.5)

## 2016-05-09 LAB — BASIC METABOLIC PANEL
Anion gap: 20 — ABNORMAL HIGH (ref 5–15)
BUN: 25 mg/dL — AB (ref 6–20)
CHLORIDE: 104 mmol/L (ref 101–111)
CO2: 17 mmol/L — ABNORMAL LOW (ref 22–32)
Calcium: 8.8 mg/dL — ABNORMAL LOW (ref 8.9–10.3)
Creatinine, Ser: 1.68 mg/dL — ABNORMAL HIGH (ref 0.44–1.00)
GFR calc Af Amer: 35 mL/min — ABNORMAL LOW (ref >60)
GFR calc non Af Amer: 30 mL/min — ABNORMAL LOW (ref >60)
GLUCOSE: 153 mg/dL — AB (ref 65–99)
Potassium: 3.5 mmol/L (ref 3.5–5.1)
Sodium: 141 mmol/L (ref 135–145)

## 2016-05-09 LAB — GLUCOSE, CAPILLARY
GLUCOSE-CAPILLARY: 137 mg/dL — AB (ref 65–99)
GLUCOSE-CAPILLARY: 140 mg/dL — AB (ref 65–99)
GLUCOSE-CAPILLARY: 149 mg/dL — AB (ref 65–99)
GLUCOSE-CAPILLARY: 152 mg/dL — AB (ref 65–99)
GLUCOSE-CAPILLARY: 157 mg/dL — AB (ref 65–99)
GLUCOSE-CAPILLARY: 87 mg/dL (ref 65–99)
Glucose-Capillary: 161 mg/dL — ABNORMAL HIGH (ref 65–99)

## 2016-05-09 LAB — SURGICAL PCR SCREEN
MRSA, PCR: NEGATIVE
STAPHYLOCOCCUS AUREUS: POSITIVE — AB

## 2016-05-09 SURGERY — LAPAROSCOPY, DIAGNOSTIC
Anesthesia: General | Site: Abdomen

## 2016-05-09 MED ORDER — LACTATED RINGERS IV SOLN
INTRAVENOUS | Status: DC | PRN
  Administered 2016-05-09 (×2): via INTRAVENOUS

## 2016-05-09 MED ORDER — LACTATED RINGERS IV BOLUS (SEPSIS): 1000.0000 mL | Freq: Three times a day (TID) | INTRAVENOUS | Status: AC | PRN

## 2016-05-09 MED ORDER — LACTATED RINGERS IR SOLN
Status: DC | PRN
  Administered 2016-05-09: 3000 mL

## 2016-05-09 MED ORDER — HYDROMORPHONE HCL 1 MG/ML IJ SOLN
INTRAMUSCULAR | Status: DC | PRN
  Administered 2016-05-09: 1 mg via INTRAVENOUS
  Administered 2016-05-09 (×2): 0.5 mg via INTRAVENOUS

## 2016-05-09 MED ORDER — KETOROLAC TROMETHAMINE 30 MG/ML IJ SOLN: 30.0000 mg | Freq: Once | INTRAMUSCULAR | Status: DC

## 2016-05-09 MED ORDER — 0.9 % SODIUM CHLORIDE (POUR BTL) OPTIME
TOPICAL | Status: DC | PRN
  Administered 2016-05-09: 1000 mL

## 2016-05-09 MED ORDER — SUCCINYLCHOLINE CHLORIDE 20 MG/ML IJ SOLN
INTRAMUSCULAR | Status: DC | PRN
  Administered 2016-05-09: 120 mg via INTRAVENOUS

## 2016-05-09 MED ORDER — FENTANYL CITRATE (PF) 250 MCG/5ML IJ SOLN
INTRAMUSCULAR | Status: AC
  Filled 2016-05-09: qty 5

## 2016-05-09 MED ORDER — HYDROMORPHONE HCL 1 MG/ML IJ SOLN
0.5000 mg | INTRAMUSCULAR | Status: DC | PRN
  Administered 2016-05-10 – 2016-05-11 (×4): 1 mg via INTRAVENOUS
  Filled 2016-05-09 (×5): qty 1

## 2016-05-09 MED ORDER — SUGAMMADEX SODIUM 200 MG/2ML IV SOLN
INTRAVENOUS | Status: DC | PRN
  Administered 2016-05-09: 200 mg via INTRAVENOUS

## 2016-05-09 MED ORDER — HYDROMORPHONE HCL 1 MG/ML IJ SOLN
INTRAMUSCULAR | Status: AC
  Filled 2016-05-09: qty 1

## 2016-05-09 MED ORDER — METHOCARBAMOL 1000 MG/10ML IJ SOLN
1000.0000 mg | Freq: Four times a day (QID) | INTRAMUSCULAR | Status: DC | PRN
  Administered 2016-05-14: 1000 mg via INTRAVENOUS
  Filled 2016-05-09 (×3): qty 10

## 2016-05-09 MED ORDER — PROPOFOL 10 MG/ML IV BOLUS
INTRAVENOUS | Status: AC
  Filled 2016-05-09: qty 20

## 2016-05-09 MED ORDER — EPHEDRINE SULFATE 50 MG/ML IJ SOLN
INTRAMUSCULAR | Status: DC | PRN
  Administered 2016-05-09: 10 mg via INTRAVENOUS

## 2016-05-09 MED ORDER — PROMETHAZINE HCL 25 MG/ML IJ SOLN: 12.5000 mg | Freq: Once | INTRAMUSCULAR | Status: DC | PRN

## 2016-05-09 MED ORDER — MEPERIDINE HCL 50 MG/ML IJ SOLN: 6.2500 mg | INTRAMUSCULAR | Status: DC | PRN

## 2016-05-09 MED ORDER — LIDOCAINE 2% (20 MG/ML) 5 ML SYRINGE
INTRAMUSCULAR | Status: AC
  Filled 2016-05-09: qty 5

## 2016-05-09 MED ORDER — MIDAZOLAM HCL 2 MG/2ML IJ SOLN
INTRAMUSCULAR | Status: AC
  Filled 2016-05-09: qty 2

## 2016-05-09 MED ORDER — EPHEDRINE 5 MG/ML INJ
INTRAVENOUS | Status: AC
  Filled 2016-05-09: qty 10

## 2016-05-09 MED ORDER — DEXTROSE 5 % IV SOLN
INTRAVENOUS | Status: DC | PRN
  Administered 2016-05-09: 2 g via INTRAVENOUS

## 2016-05-09 MED ORDER — ONDANSETRON HCL 4 MG/2ML IJ SOLN
INTRAMUSCULAR | Status: DC | PRN
  Administered 2016-05-09: 4 mg via INTRAVENOUS

## 2016-05-09 MED ORDER — PROPOFOL 10 MG/ML IV BOLUS
INTRAVENOUS | Status: AC
Start: 2016-05-09 — End: 2016-05-09
  Filled 2016-05-09: qty 20

## 2016-05-09 MED ORDER — DEXTROSE 5 % IV SOLN
2.0000 g | Freq: Two times a day (BID) | INTRAVENOUS | Status: AC
  Administered 2016-05-09: 2 g via INTRAVENOUS
  Filled 2016-05-09: qty 2

## 2016-05-09 MED ORDER — CEFOTETAN DISODIUM-DEXTROSE 2-2.08 GM-% IV SOLR
INTRAVENOUS | Status: AC
  Filled 2016-05-09: qty 50

## 2016-05-09 MED ORDER — PHENYLEPHRINE 40 MCG/ML (10ML) SYRINGE FOR IV PUSH (FOR BLOOD PRESSURE SUPPORT)
PREFILLED_SYRINGE | INTRAVENOUS | Status: AC
  Filled 2016-05-09: qty 10

## 2016-05-09 MED ORDER — SODIUM CHLORIDE 0.9 % IV SOLN
INTRAVENOUS | Status: DC | PRN
  Administered 2016-05-09: 10:00:00 via INTRAVENOUS

## 2016-05-09 MED ORDER — SUGAMMADEX SODIUM 200 MG/2ML IV SOLN
INTRAVENOUS | Status: AC
  Filled 2016-05-09: qty 2

## 2016-05-09 MED ORDER — PROPOFOL 10 MG/ML IV BOLUS
INTRAVENOUS | Status: DC | PRN
  Administered 2016-05-09: 140 mg via INTRAVENOUS
  Administered 2016-05-09: 60 mg via INTRAVENOUS
  Administered 2016-05-09: 50 mg via INTRAVENOUS

## 2016-05-09 MED ORDER — DIPHENHYDRAMINE HCL 50 MG/ML IJ SOLN: 12.5000 mg | Freq: Four times a day (QID) | INTRAMUSCULAR | Status: DC | PRN

## 2016-05-09 MED ORDER — ONDANSETRON HCL 4 MG/2ML IJ SOLN
INTRAMUSCULAR | Status: AC
  Filled 2016-05-09: qty 2

## 2016-05-09 MED ORDER — FENTANYL CITRATE (PF) 100 MCG/2ML IJ SOLN
INTRAMUSCULAR | Status: DC | PRN
  Administered 2016-05-09 (×2): 50 ug via INTRAVENOUS
  Administered 2016-05-09 (×2): 100 ug via INTRAVENOUS
  Administered 2016-05-09 (×4): 50 ug via INTRAVENOUS

## 2016-05-09 MED ORDER — HYDROMORPHONE HCL 2 MG/ML IJ SOLN
INTRAMUSCULAR | Status: AC
  Filled 2016-05-09: qty 1

## 2016-05-09 MED ORDER — ROCURONIUM BROMIDE 100 MG/10ML IV SOLN
INTRAVENOUS | Status: DC | PRN
  Administered 2016-05-09: 10 mg via INTRAVENOUS
  Administered 2016-05-09: 50 mg via INTRAVENOUS

## 2016-05-09 MED ORDER — PHENYLEPHRINE HCL 10 MG/ML IJ SOLN
INTRAMUSCULAR | Status: DC | PRN
  Administered 2016-05-09: 120 mg via INTRAVENOUS

## 2016-05-09 MED ORDER — BUPIVACAINE-EPINEPHRINE 0.25% -1:200000 IJ SOLN
INTRAMUSCULAR | Status: DC | PRN
  Administered 2016-05-09: 50 mL

## 2016-05-09 MED ORDER — MIDAZOLAM HCL 2 MG/2ML IJ SOLN
INTRAMUSCULAR | Status: DC | PRN
  Administered 2016-05-09: 2 mg via INTRAVENOUS

## 2016-05-09 MED ORDER — HYDROMORPHONE HCL 1 MG/ML IJ SOLN
0.2500 mg | INTRAMUSCULAR | Status: DC | PRN
  Administered 2016-05-09: 0.5 mg via INTRAVENOUS

## 2016-05-09 MED ORDER — BUPIVACAINE-EPINEPHRINE 0.25% -1:200000 IJ SOLN
INTRAMUSCULAR | Status: AC
  Filled 2016-05-09: qty 2

## 2016-05-09 SURGICAL SUPPLY — 77 items
APPLIER CLIP 5 13 M/L LIGAMAX5 (MISCELLANEOUS)
APPLIER CLIP ROT 10 11.4 M/L (STAPLE)
BLADE HEX COATED 2.75 (ELECTRODE) ×4 IMPLANT
BLADE SURG SZ10 CARB STEEL (BLADE) ×4 IMPLANT
CABLE HIGH FREQUENCY MONO STRZ (ELECTRODE) ×4 IMPLANT
CELLS DAT CNTRL 66122 CELL SVR (MISCELLANEOUS) IMPLANT
CLIP APPLIE 5 13 M/L LIGAMAX5 (MISCELLANEOUS) IMPLANT
CLIP APPLIE ROT 10 11.4 M/L (STAPLE) IMPLANT
COVER MAYO STAND STRL (DRAPES) ×4 IMPLANT
COVER SURGICAL LIGHT HANDLE (MISCELLANEOUS) ×4 IMPLANT
DECANTER SPIKE VIAL GLASS SM (MISCELLANEOUS) ×4 IMPLANT
DRAIN CHANNEL 19F RND (DRAIN) IMPLANT
DRAPE LAPAROSCOPIC ABDOMINAL (DRAPES) ×4 IMPLANT
DRAPE SHEET LG 3/4 BI-LAMINATE (DRAPES) IMPLANT
DRAPE UTILITY XL STRL (DRAPES) ×4 IMPLANT
DRAPE WARM FLUID 44X44 (DRAPE) ×4 IMPLANT
DRSG OPSITE POSTOP 4X10 (GAUZE/BANDAGES/DRESSINGS) IMPLANT
DRSG OPSITE POSTOP 4X6 (GAUZE/BANDAGES/DRESSINGS) ×4 IMPLANT
DRSG OPSITE POSTOP 4X8 (GAUZE/BANDAGES/DRESSINGS) IMPLANT
DRSG TEGADERM 2-3/8X2-3/4 SM (GAUZE/BANDAGES/DRESSINGS) ×4 IMPLANT
DRSG TEGADERM 4X4.75 (GAUZE/BANDAGES/DRESSINGS) ×4 IMPLANT
ELECT PENCIL ROCKER SW 15FT (MISCELLANEOUS) ×4 IMPLANT
ELECT REM PT RETURN 9FT ADLT (ELECTROSURGICAL) ×4
ELECTRODE REM PT RTRN 9FT ADLT (ELECTROSURGICAL) ×2 IMPLANT
ENDOLOOP SUT PDS II  0 18 (SUTURE)
ENDOLOOP SUT PDS II 0 18 (SUTURE) IMPLANT
GAUZE SPONGE 2X2 8PLY STRL LF (GAUZE/BANDAGES/DRESSINGS) ×2 IMPLANT
GAUZE SPONGE 4X4 12PLY STRL (GAUZE/BANDAGES/DRESSINGS) IMPLANT
GLOVE ECLIPSE 8.0 STRL XLNG CF (GLOVE) ×8 IMPLANT
GLOVE INDICATOR 8.0 STRL GRN (GLOVE) ×4 IMPLANT
GOWN STRL REUS W/TWL XL LVL3 (GOWN DISPOSABLE) ×16 IMPLANT
HANDLE SUCTION POOLE (INSTRUMENTS) ×2 IMPLANT
IRRIG SUCT STRYKERFLOW 2 WTIP (MISCELLANEOUS) ×4
IRRIGATION SUCT STRKRFLW 2 WTP (MISCELLANEOUS) ×2 IMPLANT
KIT BASIN OR (CUSTOM PROCEDURE TRAY) ×4 IMPLANT
LEGGING LITHOTOMY PAIR STRL (DRAPES) IMPLANT
LIGASURE IMPACT 36 18CM CVD LR (INSTRUMENTS) ×4 IMPLANT
LUBRICANT JELLY K Y 4OZ (MISCELLANEOUS) IMPLANT
PAD POSITIONING PINK XL (MISCELLANEOUS) ×4 IMPLANT
POSITIONER SURGICAL ARM (MISCELLANEOUS) ×4 IMPLANT
RELOAD AUTO 90-3.5 TA90 BLE (ENDOMECHANICALS) ×4 IMPLANT
RELOAD PROXIMATE 75MM BLUE (ENDOMECHANICALS) ×12 IMPLANT
RTRCTR WOUND ALEXIS 18CM MED (MISCELLANEOUS)
SCISSORS LAP 5X35 DISP (ENDOMECHANICALS) ×4 IMPLANT
SEALER TISSUE G2 CVD JAW 35 (ENDOMECHANICALS) IMPLANT
SEALER TISSUE G2 CVD JAW 45CM (ENDOMECHANICALS)
SEALER TISSUE G2 STRG ARTC 35C (ENDOMECHANICALS) IMPLANT
SLEEVE XCEL OPT CAN 5 100 (ENDOMECHANICALS) ×8 IMPLANT
SPONGE GAUZE 2X2 STER 10/PKG (GAUZE/BANDAGES/DRESSINGS) ×2
SPONGE LAP 18X18 X RAY DECT (DISPOSABLE) IMPLANT
STAPLER 90 3.5 STAND SLIM (STAPLE) ×4
STAPLER 90 3.5 STD SLIM (STAPLE) ×2 IMPLANT
STAPLER PROXIMATE 75MM BLUE (STAPLE) ×4 IMPLANT
STAPLER VISISTAT 35W (STAPLE) IMPLANT
SUCTION POOLE HANDLE (INSTRUMENTS) ×4
SUT MNCRL AB 4-0 PS2 18 (SUTURE) ×4 IMPLANT
SUT PDS AB 1 CTX 36 (SUTURE) ×8 IMPLANT
SUT PDS AB 1 TP1 96 (SUTURE) IMPLANT
SUT PROLENE 0 CT 2 (SUTURE) IMPLANT
SUT SILK 2 0 (SUTURE) ×2
SUT SILK 2 0 SH CR/8 (SUTURE) ×16 IMPLANT
SUT SILK 2-0 18XBRD TIE 12 (SUTURE) ×2 IMPLANT
SUT SILK 3 0 (SUTURE) ×2
SUT SILK 3 0 SH CR/8 (SUTURE) ×4 IMPLANT
SUT SILK 3-0 18XBRD TIE 12 (SUTURE) ×2 IMPLANT
SYR BULB IRRIGATION 50ML (SYRINGE) ×4 IMPLANT
SYS LAPSCP GELPORT 120MM (MISCELLANEOUS)
SYSTEM LAPSCP GELPORT 120MM (MISCELLANEOUS) IMPLANT
TAPE UMBILICAL COTTON 1/8X30 (MISCELLANEOUS) IMPLANT
TOWEL OR 17X26 10 PK STRL BLUE (TOWEL DISPOSABLE) ×8 IMPLANT
TOWEL OR NON WOVEN STRL DISP B (DISPOSABLE) ×4 IMPLANT
TRAY FOLEY W/METER SILVER 16FR (SET/KITS/TRAYS/PACK) IMPLANT
TRAY LAPAROSCOPIC (CUSTOM PROCEDURE TRAY) ×4 IMPLANT
TROCAR BLADELESS OPT 5 100 (ENDOMECHANICALS) ×4 IMPLANT
TROCAR XCEL NON-BLD 11X100MML (ENDOMECHANICALS) IMPLANT
TUBING INSUF HEATED (TUBING) ×4 IMPLANT
YANKAUER SUCT BULB TIP 10FT TU (MISCELLANEOUS) ×4 IMPLANT

## 2016-05-09 NOTE — H&P (View-Only) (Signed)
Bailey [name]  [birth year] [id number]  Patient Care Team: No Pcp Per Patient as PCP - General (General Practice)  This patient is a 66 y.o.female who calls today for surgical evaluation.   Past evaluate by Dr. Delana Meyer as I am coming on call this weekend.  Patient with abdominal distention and intermittent pain.  Repeat CT scan more suggestive of intraluminal intestinal mass causing bowel obstruction.  Patient did have some emesis with the oral contrast but feeling better now.  Not having any flatus or bowel movements.  Would like to avoid a nasogastric tube if possible.  No abdominal pain.  Discussed with the patient that she has good transition zone with worsening distention and ascites.  No evidence of perforation or abscess at this time.  I think she would benefit from abdominal exploration with probable bowel resection of area of transition.  Do not know if this is a tumor, atypical diverticulum causing obstruction or perforation.  We will start lap assisted depending on how much can be done.  She is distended but rather soft.  There are risks to surgery but she understands these risks and wishes to proceed.  She is not toxic so we will do this first thing in the morning.  The anatomy & physiology of the digestive tract was discussed.  The pathophysiology of perforation was discussed.  Differential diagnosis such as perforated ulcer or colon, etc was discussed.   Natural history risks without surgery such as death was discussed.  I recommended abdominal exploration to diagnose & treat the source of the problem.  Laparoscopic & open techniques were discussed.   Risks such as bleeding, infection, abscess, leak, reoperation, bowel resection, possible ostomy, injury to other organs, need for repair of tissues / organs, hernia, heart attack, death, and other risks were discussed.   The risks of no intervention will lead to serious problems including death.   I expressed a good likelihood that surgery will  address the problem.    Goals of post-operative recovery were discussed as well.  We will work to minimize complications although risks in an emergent setting are high.   Questions were answered.  The patient expressed understanding & wishes to proceed with surgery.       General: Pt awake/alert/oriented x4 in no major acute distress Eyes: PERRL, normal EOM. Sclera nonicteric Neuro: CN II-XII intact w/o focal sensory/motor deficits. Lymph: No head/neck/groin lymphadenopathy Psych:  No delerium/psychosis/paranoia HENT: Normocephalic, Mucus membranes moist.  No thrush Neck: Supple, No tracheal deviation Chest: No pain.  Good respiratory excursion. CV:  Pulses intact.  Regular rhythm MS: Normal AROM mjr joints.  No obvious deformity Abdomen: Soft, moderately distended.  Nontender.  No incarcerated hernias. Ext:  SCDs BLE.  No significant edema.  No cyanosis Skin: No petechiae / purpura   Patient Active Problem List   Diagnosis Date Noted  . SBO (small bowel obstruction) 05/08/2016  . Diverticular disease of jejunum 05/08/2016  . UTI (urinary tract infection) 05/06/2016  . Dehydration   . Perforated viscus   . Mass of small intestine 05/05/2016  . Normocytic anemia 05/05/2016  . Diabetes mellitus without complication [facility])     Past Medical History:  Diagnosis Date  . Diabetes mellitus     Past Surgical History:  Procedure Laterality Date  . INCISION AND DRAINAGE ABSCESS Left 02/11/2014   Procedure: INCISION AND DRAINAGE ABSCESS Left Buttock;  Surgeon: Jackolyn Confer, MD;  Location: [facility];  Service: General;  Laterality: Left;  . TOE AMPUTATION  left foot great toe    Social History   Social History  . Marital status: Married    Spouse name: N/A  . Number of children: N/A  . Years of education: N/A   Occupational History  . Not on file.   Social History Main Topics  . Smoking status: Never Smoker  . Smokeless tobacco: Never Used  . Alcohol use No  . Drug  use: No  . Sexual activity: Not on file   Other Topics Concern  . Not on file   Social History Narrative  . No narrative on file    Family History  Problem Relation Age of Onset  . Diabetes Mother   . Asthma Sister     Current Facility-Administered Medications  Medication Dose Route Frequency Provider Last Rate Last Dose  . acetaminophen (TYLENOL) suppository 650 mg  650 mg Rectal Q6H PRN Michael Boston, MD      . Derrill Memo ON 05/09/2016] acetaminophen (TYLENOL) tablet 1,000 mg  1,000 mg Oral On Call to OR Michael Boston, MD      . acetaminophen (TYLENOL) tablet 325-650 mg  325-650 mg Oral Q6H PRN Michael Boston, MD      . bisacodyl (DULCOLAX) suppository 10 mg  10 mg Rectal Q12H PRN Michael Boston, MD      . Derrill Memo ON 05/09/2016] cefoTEtan (CEFOTAN) 2 g in dextrose 5 % 50 mL IVPB  2 g Intravenous On Call to Jerome, MD      . cefTRIAXone (ROCEPHIN) 2 g in dextrose 5 % 50 mL IVPB  2 g Intravenous Q24H Michael Boston, MD      . diphenhydrAMINE (BENADRYL) injection 12.5-25 mg  12.5-25 mg Intravenous Q6H PRN Michael Boston, MD      . enoxaparin (LOVENOX) injection 40 mg  40 mg Subcutaneous QHS Vianne Bulls, MD   40 mg at 05/08/16 2103  . [START ON 05/09/2016] gabapentin (NEURONTIN) capsule 300 mg  300 mg Oral On Call to Obetz, MD      . hydrALAZINE (APRESOLINE) injection 5-20 mg  5-20 mg Intravenous Q6H PRN Michael Boston, MD      . HYDROmorphone (DILAUDID) injection 0.5-2 mg  0.5-2 mg Intravenous Q2H PRN Michael Boston, MD      . insulin aspart (novoLOG) injection 0-15 Units  0-15 Units Subcutaneous Q4H Vianne Bulls, MD   2 Units at 05/08/16 2104  . insulin aspart (novoLOG) injection 7 Units  7 Units Subcutaneous Once Ilene Qua Opyd, MD      . iopamidol (ISOVUE-300) 61 % injection           . lactated ringers bolus 1,000 mL  1,000 mL Intravenous Q8H PRN Michael Boston, MD      . lactated ringers infusion   Intravenous Continuous Michael Boston, MD 75 mL/hr at 05/08/16 2103    . lip balm  (CARMEX) ointment 1 application  1 application Topical BID Michael Boston, MD      . magic mouthwash  15 mL Oral QID PRN Michael Boston, MD      . menthol-cetylpyridinium (CEPACOL) lozenge 3 mg  1 lozenge Oral PRN Michael Boston, MD      . metoCLOPramide (REGLAN) injection 5-10 mg  5-10 mg Intravenous Q6H PRN Michael Boston, MD      . metoprolol (LOPRESSOR) injection 5 mg  5 mg Intravenous Q6H PRN Michael Boston, MD      . morphine 4 MG/ML injection 1-3 mg  1-3 mg Intravenous Q2H PRN Mauricio  Gerome Apley, MD   2 mg at 05/08/16 0226  . ondansetron (ZOFRAN) injection 4 mg  4 mg Intravenous Q6H PRN Michael Boston, MD       Or  . ondansetron (ZOFRAN) 8 mg in sodium chloride 0.9 % 50 mL IVPB  8 mg Intravenous Q6H PRN Michael Boston, MD      . ondansetron [facility]) tablet 4 mg  4 mg Oral Q6H PRN Vianne Bulls, MD      . phenol (CHLORASEPTIC) mouth spray 2 spray  2 spray Mouth/Throat PRN Michael Boston, MD      . prochlorperazine (COMPAZINE) injection 5-10 mg  5-10 mg Intravenous Q4H PRN Michael Boston, MD         No Known Allergies  BP 136/64 (BP Location: Right Arm)   Pulse 76   Temp 97.6 F (36.4 C) (Oral)   Resp 16   Ht $R'5\' 4"'CA$  (1.626 m)   Wt 66.6 kg (146 lb 13.2 oz)   SpO2 96%   BMI 25.20 kg/m   Ct Abdomen Pelvis W Contrast  Result Date: 05/08/2016 CLINICAL DATA:  Contained small bowel perforation seen on previous CT. EXAM: CT ABDOMEN AND PELVIS WITH CONTRAST TECHNIQUE: Multidetector CT imaging of the abdomen and pelvis was performed using the standard protocol following bolus administration of intravenous contrast. CONTRAST:  138mL ISOVUE-300 IOPAMIDOL (ISOVUE-300) INJECTION 61% COMPARISON:  05/05/2016 FINDINGS: Lower chest:  Subsegmental atelectasis noted lung bases. Hepatobiliary: No focal abnormality within the liver parenchyma. Gallbladder is markedly distended. No intrahepatic or extrahepatic biliary dilation. Pancreas: Pancreas diffusely atrophic. Spleen: No splenomegaly. No focal mass lesion.  Adrenals/Urinary Tract: No adrenal nodule or mass. Kidneys unremarkable. No evidence for hydroureter. Bladder is decompressed. Gas in the bladder lumen presumably secondary to recent instrumentation. Stomach/Bowel: Stomach is moderately distended. Large duodenal diverticulum evident. In the interval since prior study, small-bowel loops have become diffusely fluid filled and dilated, measuring up to 3.6 cm diameter. On today's exam, multiple large diverticuli of the small bowel are seen in the proximal small bowel loops. The area that appeared to represent contained abscess on the previous study is no longer present. However, the area on the prior study felt to represent intussusception is now seen just to the right and caudal of the umbilicus and represents a primary small bowel lesion. This lesion is the site of small bowel obstruction with loops distal to this location being decompressed. The terminal ileum is normal. The appendix is normal. The colon shows diffuse diverticular change and is collapsed along its entire length. Vascular/Lymphatic: No abdominal aortic aneurysm. The portal vein and superior mesenteric vein are patent. Splenic vein is patent. There is no gastrohepatic or hepatoduodenal ligament lymphadenopathy. No intraperitoneal or retroperitoneal lymphadenopathy. No pelvic sidewall lymphadenopathy. Reproductive: The uterus has normal CT imaging appearance. There is no adnexal mass. Other: Free intraperitoneal fluid is identified around the liver, spleen, in the para colic gutters, and in the central pelvis. Since the prior study, the patient has developed fairly prominent interloop mesenteric fluid associated with the dilated small bowel loops. Musculoskeletal: Bone windows reveal no worrisome lytic or sclerotic osseous lesions. IMPRESSION: 1. In the interval since the prior study, the patient has developed a small bowel obstruction secondary to an intrinsic small-bowel lesion located in the mid  small bowel, near the jejunal ileal junction probably in the proximal to mid ileum. This lesion is visible on image 64 of series 2 today, represents circumferential wall thickening along a length of about 3 cm and is  just deep to the rectus fascia to the right and caudal of the umbilicus. Small bowel distal to this lesion is decompressed as is the entire colon. 2. Patient is noted to have multiple relatively large jejunal diverticuli. As there is no residual of the apparent contained perforation seen on the prior study, this appearance on the prior study was probably related to a large debris filled small bowel diverticulum. 3. Interval development of small to moderate volume interloop mesenteric and free intraperitoneal fluid. 4. Diffuse colonic diverticulosis. I personally discussed these findings by telephone with Dr. Cathlean Sauer at approximately 10:50 a.m. on 05/08/2016. Electronically Signed   By: Misty Stanley M.D.   On: 05/08/2016 10:54   Ct Abdomen Pelvis W Contrast  Result Date: 05/05/2016 CLINICAL DATA:  Lower abdominal pain EXAM: CT ABDOMEN AND PELVIS WITH CONTRAST TECHNIQUE: Multidetector CT imaging of the abdomen and pelvis was performed using the standard protocol following bolus administration of intravenous contrast. CONTRAST:  126mL ISOVUE-300 IOPAMIDOL (ISOVUE-300) INJECTION 61% COMPARISON:  None. FINDINGS: Lower chest: No acute abnormality. Hepatobiliary: No focal liver abnormality is seen. No gallstones, gallbladder wall thickening, or biliary dilatation. Pancreas: Unremarkable. No pancreatic ductal dilatation or surrounding inflammatory changes. Spleen: Normal in size without focal abnormality. Adrenals/Urinary Tract: Adrenal glands are unremarkable. Kidneys are normal, without renal calculi, focal lesion, or hydronephrosis. Bladder is well distended although some air is noted in the nondependent portion of the bladder. This may be related to recent instrumentation. Clinical correlation is  recommended. Stomach/Bowel: Diverticular change of the colon is noted without evidence of diverticulitis. No obstructive nor inflammatory changes are seen. The appendix is well visualized and within normal limits. No inflammatory changes are noted. An area of ring enhancement is identified in the left mid abdomen within a loop of small bowel suggestive of intermittent intussusception. In the mid abdomen adjacent to a loop of small bowel there is a somewhat irregular 5.3 by 3.3 cm air-fluid collection consistent with focal perforation and abscess formation. This lies in close proximity to the area of intussusception. No other focal abnormality is seen. Vascular/Lymphatic: Aortic atherosclerosis. No enlarged abdominal or pelvic lymph nodes. Reproductive: The uterus is within normal limits. Other: Minimal free pelvic fluid is noted. Musculoskeletal: No acute or significant osseous findings. IMPRESSION: Air-fluid collection adjacent to loop of small bowel consistent with an area of contained perforation. This lies in the mid abdomen and is best seen on image number 58 of series 2. In a small bowel loop adjacent to this there are changes consistent within intermittent intussusception. No obstructive changes are noted. Diverticular change without diverticulitis. Air within the bladder although likely related to instrumentation. Clinical correlation is recommended. Electronically Signed   By: Inez Catalina M.D.   On: 05/05/2016 21:36    Note: This dictation was prepared with Dragon/digital dictation along with [employer]. Any transcriptional errors that result from this process are unintentional.   .Adin Hector, M.D., F.A.C.S. Gastrointestinal and Minimally Invasive Surgery [facility] [address] [place], [place] [address] [phone] Main / Paging  05/08/2016 9:44 PM

## 2016-05-09 NOTE — Interval H&P Note (Signed)
History and Physical Interval Note:  05/09/2016 7:30 AM  Bailey [name]  has presented today for surgery, with the diagnosis of small bowel obstruction with tumor  The various methods of treatment have been discussed with the patient and family. After consideration of risks, benefits and other options for treatment, the patient has consented to  Procedure(s): LAPAROSCOPY DIAGNOSTIC (N/A) LAPAROSCOPIC SMALL BOWEL RESECTION (N/A) as a surgical intervention .  The patient's history has been reviewed, patient examined, no change in status, stable for surgery.  I have reviewed the patient's chart and labs.  Questions were answered to the patient's satisfaction.     Shawnmichael Parenteau C.

## 2016-05-09 NOTE — Anesthesia Procedure Notes (Signed)
Procedure Name: Intubation Date/Time: 05/09/2016 7:55 AM Performed by: Lissa Morales Pre-anesthesia Checklist: Patient identified, Emergency Drugs available, Suction available and Patient being monitored Patient Re-evaluated:Patient Re-evaluated prior to inductionOxygen Delivery Method: Circle system utilized Preoxygenation: Pre-oxygenation with 100% oxygen Intubation Type: IV induction Ventilation: Mask ventilation without difficulty Grade View: Grade II Tube type: Oral Tube size: 7.0 mm Number of attempts: 1 Airway Equipment and Method: Stylet and Oral airway Placement Confirmation: ETT inserted through vocal cords under direct vision,  positive ETCO2 and breath sounds checked- equal and bilateral Secured at: 21 cm Tube secured with: Tape Dental Injury: Teeth and Oropharynx as per pre-operative assessment

## 2016-05-09 NOTE — Addendum Note (Signed)
Addendum  created 05/09/16 1254 by Lissa Morales, CRNA   Anesthesia Event edited, Anesthesia Intra Flowsheets edited, Anesthesia Intra Meds edited, Anesthesia Staff edited, Sign clinical note

## 2016-05-09 NOTE — Anesthesia Preprocedure Evaluation (Signed)
Anesthesia Evaluation  Patient identified by MRN, date of birth, ID band Patient awake    Reviewed: Allergy & Precautions, H&P , NPO status , Patient's Chart, lab work & pertinent test results  Airway Mallampati: II  TM Distance: >3 FB Neck ROM: full    Dental  (+) Missing, Dental Advisory Given Missing all of front teeth on left side:   Pulmonary neg pulmonary ROS,    Pulmonary exam normal breath sounds clear to auscultation       Cardiovascular Exercise Tolerance: Good negative cardio ROS Normal cardiovascular exam Rhythm:regular Rate:Normal     Neuro/Psych negative neurological ROS  negative psych ROS   GI/Hepatic negative GI ROS, Neg liver ROS,   Endo/Other  diabetes, Type 2, Insulin Dependent  Renal/GU negative Renal ROS  negative genitourinary   Musculoskeletal negative musculoskeletal ROS (+)   Abdominal Normal abdominal exam  (+)   Peds  Hematology   Anesthesia Other Findings   Reproductive/Obstetrics negative OB ROS                             Anesthesia Physical  Anesthesia Plan  ASA: II  Anesthesia Plan: General   Post-op Pain Management:    Induction: Intravenous  Airway Management Planned: Oral ETT  Additional Equipment:   Intra-op Plan:   Post-operative Plan: Extubation in OR  Informed Consent: I have reviewed the patients History and Physical, chart, labs and discussed the procedure including the risks, benefits and alternatives for the proposed anesthesia with the patient or authorized representative who has indicated his/her understanding and acceptance.   Dental Advisory Given  Plan Discussed with: CRNA and Surgeon  Anesthesia Plan Comments:         Anesthesia Quick Evaluation

## 2016-05-09 NOTE — Op Note (Addendum)
05/09/2016  10:32 AM  PATIENT:  Bailey [name]  66 y.o. female  Patient Care Team: [name], MD as PCP - General (Family Medicine) Michael Boston, MD as Consulting Physician (General Surgery) Sydnee Cabal, MD as Consulting Physician (Orthopedic Surgery)  PRE-OPERATIVE DIAGNOSIS:    Small bowel obstruction with possible tumor Jejunal diverticulosis  POST-OPERATIVE DIAGNOSIS:    Proximal jejunal diverticulosis with mesenteric mass (Probable jejunal diverticulitis versus tumor) causing internal hernia & SBO  Small bowel fecalization at transition point  PROCEDURE:    Harvey x 2  SURGEON:  Adin Hector, MD  ASSISTANT: RN   ANESTHESIA:   local and general  EBL:  No intake/output data recorded.  Delay start of Pharmacological VTE agent (>24hrs) due to surgical blood loss or risk of bleeding:  no  DRAINS: none   SPECIMENS:    1.  Proximal jejunum with numerous enlarged thinned out large diverticuli.  Focus of inflammation and nodularity in mesentery with lymphadenopathy.  Possible tumor versus jejunal diverticulitis.  2.  Distal ileum with mesenteric adhesion mass to jejunal mass.  Excised to have en bloc resection  DISPOSITION OF SPECIMEN:  PATHOLOGY  COUNTS:  YES  PLAN OF CARE: Admit to inpatient   PATIENT DISPOSITION:  PACU - hemodynamically stable.  INDICATION:    Patient with abdominal pain.  Concern of possible small bowel perforation.  Improve with hydration and antibiotics.  Repeat CT scan more suspicious for intraluminal mass.  He wondered if it was an intussusception.  Also some diverticuli in the jejunum.  Strong transition zone.  Worsening distention and ascites.  I recommended laparoscopic possible open exploration and probable bowel resection.  The anatomy & physiology of the digestive tract was discussed.  The pathophysiology was discussed.  Natural history  risks without surgery was discussed.   I worked to give an overview of the disease and the frequent need to have multispecialty involvement.  I feel the risks of no intervention will lead to serious problems that outweigh the operative risks; therefore, I recommended a partial colectomy to remove the pathology.  Laparoscopic & open techniques were discussed.   Risks such as bleeding, infection, abscess, leak, reoperation, possible ostomy, hernia, heart attack, death, and other risks were discussed.  I noted a good likelihood this will help address the problem.   Goals of post-operative recovery were discussed as well.  We will work to minimize complications.  An educational handout on the pathology was given as well.  Questions were answered.    The patient expresses understanding & wishes to proceed with surgery.  OR FINDINGS:   Patient had some very enlarged thinned out proximal jejunal diverticuli over about 30 cm.  Inflamed meseneteric nodular mass in this region suspicious for a perforated diverticulitis with lymphadenopathy versus a tumor.  This area was adhered to distal ileal mesentery.  Resulting in closed loop obstruction.  Jejunum with inflamed mass and diverticuli excise.  Small knuckle of ileum that had the other end of the adhesion excised as well.  Thickened intraluminal 3x3cm mass actually point of fecalization.  Soft and pasty.  Easily milked to ileocecal region.  No obvious metastatic disease on visceral parietal peritoneum or liver.   DESCRIPTION:   Informed consent was confirmed.  The patient underwent general anaesthesia without difficulty.  The patient was positioned with arms tucked & secured appropriately.  VTE prevention in place.  The patient's abdomen was clipped, prepped, & draped  in a sterile fashion.  Surgical timeout confirmed our plan.  The patient was positioned in reverse Trendelenburg.  Abdominal entry was gained using optical entry technique in the  left upper  abdomen.  Entry was clean.  I induced carbon dioxide insufflation.  Camera inspection revealed no injury.  Extra ports were carefully placed under direct laparoscopic visualization.  I mobilized & reflected the greater omentum in the upper abdomen.  There is some thinly bilious ascites.  I aspirated about a liter of that.   I could see some obviously dilated proximal jejunum.  Large pedunculated extremely thinned out jejunal diverticuli noted.  No definite inflammation perforation there though.  His able to find the ascending colon.  Find the cecum.  Find the terminal ileum.  At around the small bowel proximally.  Very decompressed.  Eventually became apparent the small bowel was diving into the retroperitoneum.  Found an adhesion between the jejunum to the ileum.  I freed that off.  That seemed to be close to where the transition point was.  In the proximal ileum there was a thickened intraluminal soft mass.  I ran the small bowel more proximally.  The proximal jejunum there were about five large thinned out jejunal diverticuli.  A couple of them very large.  There is also an inflamed mass which is the proximal causing the close small bowel loop obstruction.  Firm and nodular.  I placed a clamp with the intraluminal mass.  I placed a wound protector through a periumbilcal 6cm incision in the supraumbilical region.  With that, I was able to eviserate the  entire small bowel.  I found the intraluminal mass.  It is actually soft and pasty.  Easily decompressed and mashable.  Consistent more with fecalization.  No tumor.  No polyp.  No mass.  I could not milk it down to the ileocecal region several feet without difficulty.  There was a firm nodule in the mid ileum consistent with the distal adhesion point.  I came more proximally and came to about a foot of proximal jejunum that had about five enlarged jejunal diverticuli.  One rather enlarged and pedunculated extremely thinned out.  Mesenteric mass within this  region as the proximal adhesion point.  Rather firm and nodule.  I decided to excise this region for suspicious of a tumor.  I excised proximal jejunum  I could isolate the pathology. When he went ahead and proceeded with transection.   I did a side-to-side stapled anastomosis using a 38mm GIA stapler.  I aspirated over a liter of fluid and succus and help decompress the proximal small bowel markedly.  Also some mid jejunal small bowel.  This helped the bowel be more normal caliber.  It pinked up and look more healthy.  We then transected the specimen off (including the common bowel defect) using a TX-90 stapler.  I took the intervening mesentery using bipolar energy and occasional clamps and silk ties as well.  There is actually some bulky more proximal jejunal mesenteric masses suspicious for lymphadenopathy.  I transected that in a radial fashion taking care to preserve the major pedicles.  We assured hemostasis.  I closed off the common mesenteric defect using interrupted silk stitches to avoid any internal hernias.  I used some of that redundant mesentery to oversew and protect the TX staple line.  I then ran the small bowel more distally.  On a firm mesenteric mass in the mid/distal ileum consistent with the proximal adhesion.  It seems  more nodular than just inflammation.  Ahead and incised a 10 cm region of that in a similar fashion as noted above.  I reran the small bowel from the ileocecal valve to ligament of Treitz.  There was one mild ileal web that was smooth and not nodular.  Still had a 20 mm caliber opening.  Since was a smooth very narrow ring consistent with an AB up.  I left that alone since already had two small bowel resections.  I could milk the fecalized material across it quite easily.  Not a point of obstruction.  I did already had two small bowel resections so did not want to do a third.  Small bowel returned to the abdomen.  I changed gloves.  Switched to clean instruments.  Did  laparoscopic exploration.  Did copious irrigation of 5 L of saline.  Clear return.  I again ran the small bowel laparoscopically.  Anastomoses looked healthy and viable.  No twisting or torsion.  Came year necrosis.  No other abnormalities.  I allowed the greater omentum to come down to the abdomen.  I confirmed that the nasogastric tube tip was going down to the antrum.  The stomach was quite massively dilated but well decompressed.  No peritoneum evacuated.  Ports and wound protector removed.  I closed the 67mm port sites using Monocryl stitch and sterile dressing.  I closed the midline incision using #1 PDS running closure. I closed the transverse periumbilical incision with some interrupted Monocryl stitches. Patient is being extubated go to recovery room.  I am about to look for family to discuss findings and plans.  Nasogastric tube decompression and hydration.  Wait for ileus to resolve.    I made an attempt to locate family to discuss patient's status and recommendations.  No one in waiting rooms nor patient room.  No one is available at this time.  I will try again later    Adin Hector, M.D., F.A.C.S. Gastrointestinal and Minimally Invasive Surgery [facility] [address], [place], [place] [address] [phone] Main / Paging

## 2016-05-09 NOTE — Transfer of Care (Signed)
Immediate Anesthesia Transfer of Care Note  Patient: Bailey [name]  Procedure(s) Performed: Procedure(s): LAPAROSCOPY DIAGNOSTIC, LYSIS OF ADHESIONS, SMALL BOWEL RESECTION X 2 (N/A) LAPAROSCOPIC SMALL BOWEL RESECTION (N/A)  Patient Location: PACU  Anesthesia Type:General  Level of Consciousness: awake, alert , oriented and patient cooperative  Airway & Oxygen Therapy: Patient Spontanous Breathing and Patient connected to face mask oxygen  Post-op Assessment: Report given to RN, Post -op Vital signs reviewed and stable and Patient moving all extremities X 4  Post vital signs: stable  Last Vitals:  Vitals:   05/09/16 1130 05/09/16 1155  BP: 117/70 (!) 121/54  Pulse: 82 82  Resp: 14 13  Temp: 36.6 C 36.3 C    Last Pain:  Vitals:   05/09/16 1040  TempSrc:   PainSc: 9       Patients Stated Pain Goal: 4 (88/32/54 9826)  Complications: No apparent anesthesia complications charted late secondary to computer in [place] not working . Whole chart recreated from paper chart

## 2016-05-09 NOTE — Progress Notes (Signed)
PROGRESS NOTE    Bailey [name]  YJE:[id number] DOB: [birth year] DOA: 05/05/2016 PCP: [name], MD    Brief Narrative:  66 yo female with past medical history of T2dm, presented with abdominal pain, constant and severe. On the initial physical examination, she was found dehydrated, tender abdomen to palpation, CT positive for small bowel perforation. Surgery consulted with recommendations for conservative care. Started on IV antibiotics and IV fluids, NPO. Follow CT with signs of bowel obstruction, large jejunal diverticulosis with no signs of perforation. Patient underwent laparoscopic small bowel resection.    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 Small bowel obstruction. Patient sp laparoscopic small bowel resection, will follow on surgery recommendations. NG tube in place with high green liquid output. Will continue pain control with hydromorphone.   2. T2DM. Continue patient NPO, glucose cover and monitoringwith insulin sliding scale, capillary glucose 137, 162, 152. Will hold on basal insulin for now to prevent hypoglycemia.   3. Anemia multifactorial. Hb and hct stable at 9.6. Follow iron panel.   4. Urine infection. Positive for pyuria on urine analysis, culture with poly-bacterial. Will continue antibiotic therapy with Unasyn #4 for total course of 7 days.    5. New AKI. Suspected pre-renal due to bowel obstruction, noted high output on NG tube post surgery, will continue hydration with balanced electrolyte solution lactated ringers at 100 ml per hour. Follow on renal panel in am, avoid hypotension or nephrotoxic medications.   DVT prophylaxis:enoxaparin  Code  Status:full  Family Communication:No family at the bedside  Disposition Plan:Home   Consultants:  Surgery   Procedures:    Antimicrobials:   Unasyn    Subjective: Patient post op, positive abdominal pain, somnolent. Most information from the medical record. NG tube in place with high output.   Objective: Vitals:   05/08/16 2130 05/09/16 0521 05/09/16 1030 05/09/16 1040  BP: 136/64 (!) 145/75 130/61   Pulse: 76 84 88 85  Resp: $Remo'16 16 16 13  'zyVNJ$ Temp: 97.6 F (36.4 C) 98.9 F (37.2 C) 97.6 F (36.4 C)   TempSrc: Oral Oral    SpO2: 96% 100% 97% 99%  Weight:  71.3 kg (157 lb 3 oz)    Height:        Intake/Output Summary (Last 24 hours) at 05/09/16 1115 Last data filed at 05/09/16 1035  Gross per 24 hour  Intake          1056.67 ml  Output             1100 ml  Net           -43.33 ml   Filed Weights   05/06/16 0500 05/07/16 0500 05/09/16 0521  Weight: 63.5 kg (139 lb 15.9 oz) 66.6 kg (146 lb 13.2 oz) 71.3 kg (157 lb 3 oz)    Examination:  General exam: post op, somnolent and in pain E ENT: mild pallor, no icterus, oral mucosa dry.  Respiratory system: Clear to auscultation. Respiratory effort normal. No wheezing, rales or rhonchi.  Cardiovascular system: S1 & S2 heard, RRR. No JVD, murmurs, rubs, gallops or clicks. No pedal edema. Gastrointestinal system: Abdomen is distended, tender to palpation, surgical wound with dressing in  place, no drains. Soft. No organomegaly or masses felt. Decreased  bowel sounds heard. Central nervous system: Alert and oriented. No focal neurological deficits. Extremities: Symmetric 5 x 5 power. Skin: No rashes, lesions or ulcers    Data Reviewed: I have personally reviewed following labs and imaging studies  CBC:  Recent Labs Lab 05/05/16 1923 05/06/16 0450 05/07/16 0654 05/08/16 0421 05/09/16 0509  WBC 10.3 8.3 5.9 5.9 7.5  NEUTROABS  --   --   --  3.7 5.4  HGB 10.5* 9.2* 9.1* 9.6* 10.2*  HCT 31.9* 28.1* 28.0*  28.7* 30.2*  MCV 85.5 85.4 86.4 86.2 87.0  PLT 189 185 181 218 412   Basic Metabolic Panel:  Recent Labs Lab 05/05/16 1923 05/06/16 0450 05/07/16 0448 05/08/16 0421 05/09/16 0509  NA 137 134* 142 144 141  K 4.5 3.8 3.9 3.8 3.5  CL 111 110 117* 115* 104  CO2 19* 17* 16* 17* 17*  GLUCOSE 298* 186* 117* 108* 153*  BUN 27* 24* 22* 23* 25*  CREATININE 0.95 0.97 1.00 1.17* 1.68*  CALCIUM 9.0 7.9* 8.2* 8.5* 8.8*   GFR: Estimated Creatinine Clearance: 31.4 mL/min (A) (by C-G formula based on SCr of 1.68 mg/dL (H)). Liver Function Tests:  Recent Labs Lab 05/05/16 1923 05/06/16 0450  AST 11* 10*  ALT 11* 9*  ALKPHOS 115 90  BILITOT 0.7 0.6  PROT 6.9 6.1*  ALBUMIN 3.3* 2.8*    Recent Labs Lab 05/05/16 1923  LIPASE 15   No results for input(s): AMMONIA in the last 168 hours. Coagulation Profile:  Recent Labs Lab 05/06/16 0450  INR 1.23   Cardiac Enzymes: No results for input(s): CKTOTAL, CKMB, CKMBINDEX, TROPONINI in the last 168 hours. BNP (last 3 results) No results for input(s): PROBNP in the last 8760 hours. HbA1C: No results for input(s): HGBA1C in the last 72 hours. CBG:  Recent Labs Lab 05/08/16 1612 05/08/16 1959 05/08/16 2353 05/09/16 0514 05/09/16 1048  GLUCAP 117* 141* 87 137* 161*   Lipid Profile: No results for input(s): CHOL, HDL, LDLCALC, TRIG, CHOLHDL, LDLDIRECT in the last 72 hours. Thyroid Function Tests: No results for input(s): TSH, T4TOTAL, FREET4, T3FREE, THYROIDAB in the last 72 hours. Anemia Panel: No results for input(s): VITAMINB12, FOLATE, FERRITIN, TIBC, IRON, RETICCTPCT in the last 72 hours. Sepsis Labs: No results for input(s): PROCALCITON, LATICACIDVEN in the last 168 hours.  Recent Results (from the past 240 hour(s))  Blood culture (routine x 2)     Status: None (Preliminary result)   Collection Time: 05/05/16 10:15 PM  Result Value Ref Range Status   Specimen Description BLOOD LEFT ANTECUBITAL  Final   Special  Requests BOTTLES DRAWN AEROBIC AND ANAEROBIC 5 ML EA  Final   Culture   Final    NO GROWTH 3 DAYS Performed at [facility], [address] [place], [place] [address]    Report Status PENDING  Incomplete  Blood culture (routine x 2)     Status: None (Preliminary result)   Collection Time: 05/05/16 10:25 PM  Result Value Ref Range Status   Specimen Description BLOOD L WRIST  Final   Special Requests IN PEDIATRIC BOTTLE 2ML  Final   Culture   Final    NO GROWTH 3 DAYS Performed at [facility], [place] [address] [place], [place] [address]    Report Status PENDING  Incomplete  Urine culture     Status: Abnormal   Collection Time: 05/05/16 10:27 PM  Result Value  Ref Range Status   Specimen Description URINE, RANDOM  Final   Special Requests NONE  Final   Culture MULTIPLE SPECIES PRESENT, SUGGEST RECOLLECTION (A)  Final   Report Status 05/07/2016 FINAL  Final  Surgical pcr screen     Status: Abnormal   Collection Time: 05/09/16  1:51 AM  Result Value Ref Range Status   MRSA, PCR NEGATIVE NEGATIVE Final   Staphylococcus aureus POSITIVE (A) NEGATIVE Final    Comment:        The Xpert SA Assay (FDA approved for NASAL specimens in patients over 56 years of age), is one component of a comprehensive surveillance program.  Test performance has been validated by [facility] for patients greater than or equal to 73 year old. It is not intended to diagnose infection nor to guide or monitor treatment.          Radiology Studies: Ct Abdomen Pelvis W Contrast  Result Date: 05/08/2016 CLINICAL DATA:  Contained small bowel perforation seen on previous CT. EXAM: CT ABDOMEN AND PELVIS WITH CONTRAST TECHNIQUE: Multidetector CT imaging of the abdomen and pelvis was performed using the standard protocol following bolus administration of intravenous contrast. CONTRAST:  133mL ISOVUE-300 IOPAMIDOL (ISOVUE-300) INJECTION 61% COMPARISON:  05/05/2016 FINDINGS: Lower chest:   Subsegmental atelectasis noted lung bases. Hepatobiliary: No focal abnormality within the liver parenchyma. Gallbladder is markedly distended. No intrahepatic or extrahepatic biliary dilation. Pancreas: Pancreas diffusely atrophic. Spleen: No splenomegaly. No focal mass lesion. Adrenals/Urinary Tract: No adrenal nodule or mass. Kidneys unremarkable. No evidence for hydroureter. Bladder is decompressed. Gas in the bladder lumen presumably secondary to recent instrumentation. Stomach/Bowel: Stomach is moderately distended. Large duodenal diverticulum evident. In the interval since prior study, small-bowel loops have become diffusely fluid filled and dilated, measuring up to 3.6 cm diameter. On today's exam, multiple large diverticuli of the small bowel are seen in the proximal small bowel loops. The area that appeared to represent contained abscess on the previous study is no longer present. However, the area on the prior study felt to represent intussusception is now seen just to the right and caudal of the umbilicus and represents a primary small bowel lesion. This lesion is the site of small bowel obstruction with loops distal to this location being decompressed. The terminal ileum is normal. The appendix is normal. The colon shows diffuse diverticular change and is collapsed along its entire length. Vascular/Lymphatic: No abdominal aortic aneurysm. The portal vein and superior mesenteric vein are patent. Splenic vein is patent. There is no gastrohepatic or hepatoduodenal ligament lymphadenopathy. No intraperitoneal or retroperitoneal lymphadenopathy. No pelvic sidewall lymphadenopathy. Reproductive: The uterus has normal CT imaging appearance. There is no adnexal mass. Other: Free intraperitoneal fluid is identified around the liver, spleen, in the para colic gutters, and in the central pelvis. Since the prior study, the patient has developed fairly prominent interloop mesenteric fluid associated with the dilated  small bowel loops. Musculoskeletal: Bone windows reveal no worrisome lytic or sclerotic osseous lesions. IMPRESSION: 1. In the interval since the prior study, the patient has developed a small bowel obstruction secondary to an intrinsic small-bowel lesion located in the mid small bowel, near the jejunal ileal junction probably in the proximal to mid ileum. This lesion is visible on image 64 of series 2 today, represents circumferential wall thickening along a length of about 3 cm and is just deep to the rectus fascia to the right and caudal of the umbilicus. Small bowel distal to this lesion is decompressed as is  the entire colon. 2. Patient is noted to have multiple relatively large jejunal diverticuli. As there is no residual of the apparent contained perforation seen on the prior study, this appearance on the prior study was probably related to a large debris filled small bowel diverticulum. 3. Interval development of small to moderate volume interloop mesenteric and free intraperitoneal fluid. 4. Diffuse colonic diverticulosis. I personally discussed these findings by telephone with Dr. Cathlean Sauer at approximately 10:50 a.m. on 05/08/2016. Electronically Signed   By: Misty Stanley M.D.   On: 05/08/2016 10:54        Scheduled Meds: . cefoTEtan (CEFOTAN) IV  2 g Intravenous On Call to OR  . [MAR Hold] cefTRIAXone (ROCEPHIN)  IV  2 g Intravenous Q24H  . [MAR Hold] enoxaparin (LOVENOX) injection  40 mg Subcutaneous QHS  . HYDROmorphone      . [MAR Hold] insulin aspart  0-15 Units Subcutaneous Q4H  . [MAR Hold] insulin aspart  7 Units Subcutaneous Once  . ketorolac  30 mg Intravenous Once  . [MAR Hold] lip balm  1 application Topical BID   Continuous Infusions: . lactated ringers 100 mL/hr at 05/08/16 2344     LOS: 4 days       Niaya Hickok Gerome Apley, MD [facility] Pager [phone]  If 7PM-7AM, please contact night-coverage [email or web address] Password [email or web address] 05/09/2016, 11:15 AM

## 2016-05-09 NOTE — Progress Notes (Signed)
Patient has had 2 episodes of emesis about 300 cc's in all during the night. She reports it is the same green emesis she had yesterday after having contrast prior to CT scan. Zofran 4 mg IV was administered. Will continue to monitor.

## 2016-05-09 NOTE — Addendum Note (Signed)
Addendum  created 05/09/16 1751 by Lissa Morales, CRNA   Anesthesia Event deleted, Anesthesia Event edited, Anesthesia Intra Blocks edited, Anesthesia Intra Flowsheets edited, Anesthesia Intra Meds edited, Charge Capture section accepted, Child order released for a procedure order, LDA created via procedure documentation, Sign clinical note, Visit diagnoses modified

## 2016-05-09 NOTE — Transfer of Care (Signed)
Immediate Anesthesia Transfer of Care Note  Patient: Bailey [name]  Procedure(s) Performed: Procedure(s): LAPAROSCOPY DIAGNOSTIC (N/A) LAPAROSCOPIC SMALL BOWEL RESECTION (N/A)  Patient Location: PACU  Anesthesia Type:General  Level of Consciousness: Sedated  Airway & Oxygen Therapy: O2 via FM  Post-op Assessment: Stable  Post vital signs: VSSAF  Last Vitals:  Vitals:   05/08/16 2130 05/09/16 0521  BP: 136/64 (!) 145/75  Pulse: 76 84  Resp: 16 16  Temp: 36.4 C 37.2 C    Last Pain:  Vitals:   05/09/16 0521  TempSrc: Oral  PainSc:       Patients Stated Pain Goal: 3 (43/32/95 1884)  Complications: No Anesthesia complications

## 2016-05-09 NOTE — Anesthesia Postprocedure Evaluation (Addendum)
Anesthesia Post Note  Patient: Bailey [name]  Procedure(s) Performed: Procedure(s) (LRB): LAPAROSCOPY DIAGNOSTIC (N/A) LAPAROSCOPIC SMALL BOWEL RESECTION (N/A)  Patient location during evaluation: PACU Anesthesia Type: General Level of consciousness: sedated Pain management: pain level controlled Vital Signs Assessment: post-procedure vital signs reviewed and stable Respiratory status: spontaneous breathing Cardiovascular status: stable Postop Assessment: no signs of nausea or vomiting Anesthetic complications: no Comments: CBG is 161.        Last Vitals:  Vitals:   05/09/16 0521 05/09/16 1030  BP: (!) 145/75 130/61  Pulse: 84 88  Resp: 16 16  Temp: 37.2 C 36.4 C    Last Pain:  Vitals:   05/09/16 0521  TempSrc: Oral  PainSc:    Pain Goal: Patients Stated Pain Goal: 3 (05/08/16 0227)               Dashawn Golda JR,JOHN Mateo Flow

## 2016-05-10 DIAGNOSIS — N179 Acute kidney failure, unspecified: Secondary | ICD-10-CM

## 2016-05-10 DIAGNOSIS — E118 Type 2 diabetes mellitus with unspecified complications: Secondary | ICD-10-CM

## 2016-05-10 LAB — GLUCOSE, CAPILLARY
GLUCOSE-CAPILLARY: 120 mg/dL — AB (ref 65–99)
GLUCOSE-CAPILLARY: 120 mg/dL — AB (ref 65–99)
GLUCOSE-CAPILLARY: 121 mg/dL — AB (ref 65–99)
Glucose-Capillary: 102 mg/dL — ABNORMAL HIGH (ref 65–99)
Glucose-Capillary: 108 mg/dL — ABNORMAL HIGH (ref 65–99)
Glucose-Capillary: 115 mg/dL — ABNORMAL HIGH (ref 65–99)

## 2016-05-10 LAB — CBC WITH DIFFERENTIAL/PLATELET
Basophils Absolute: 0 10*3/uL (ref 0.0–0.1)
Basophils Relative: 0 %
EOS ABS: 0 10*3/uL (ref 0.0–0.7)
EOS PCT: 1 %
HCT: 24.7 % — ABNORMAL LOW (ref 36.0–46.0)
Hemoglobin: 8.4 g/dL — ABNORMAL LOW (ref 12.0–15.0)
Lymphocytes Relative: 20 %
Lymphs Abs: 1.7 10*3/uL (ref 0.7–4.0)
MCH: 29.4 pg (ref 26.0–34.0)
MCHC: 34 g/dL (ref 30.0–36.0)
MCV: 86.4 fL (ref 78.0–100.0)
Monocytes Absolute: 0.6 10*3/uL (ref 0.1–1.0)
Monocytes Relative: 7 %
Neutro Abs: 6.4 10*3/uL (ref 1.7–7.7)
Neutrophils Relative %: 72 %
PLATELETS: 225 10*3/uL (ref 150–400)
RBC: 2.86 MIL/uL — AB (ref 3.87–5.11)
RDW: 13.8 % (ref 11.5–15.5)
WBC: 8.7 10*3/uL (ref 4.0–10.5)

## 2016-05-10 LAB — CREATININE, URINE, RANDOM: CREATININE, URINE: 115.08 mg/dL

## 2016-05-10 LAB — CULTURE, BLOOD (ROUTINE X 2)
CULTURE: NO GROWTH
Culture: NO GROWTH

## 2016-05-10 LAB — MAGNESIUM: Magnesium: 1.7 mg/dL (ref 1.7–2.4)

## 2016-05-10 LAB — BASIC METABOLIC PANEL
Anion gap: 16 — ABNORMAL HIGH (ref 5–15)
BUN: 28 mg/dL — ABNORMAL HIGH (ref 6–20)
CALCIUM: 7.9 mg/dL — AB (ref 8.9–10.3)
CO2: 18 mmol/L — ABNORMAL LOW (ref 22–32)
CREATININE: 2.24 mg/dL — AB (ref 0.44–1.00)
Chloride: 109 mmol/L (ref 101–111)
GFR, EST AFRICAN AMERICAN: 25 mL/min — AB (ref >60)
GFR, EST NON AFRICAN AMERICAN: 21 mL/min — AB (ref >60)
Glucose, Bld: 122 mg/dL — ABNORMAL HIGH (ref 65–99)
Potassium: 3.4 mmol/L — ABNORMAL LOW (ref 3.5–5.1)
SODIUM: 143 mmol/L (ref 135–145)

## 2016-05-10 LAB — NA AND K (SODIUM & POTASSIUM), RAND UR
POTASSIUM UR: 32 mmol/L
SODIUM UR: 69 mmol/L

## 2016-05-10 MED ORDER — PHENOL 1.4 % MT LIQD
1.0000 | OROMUCOSAL | Status: DC | PRN
Start: 2016-05-10 — End: 2016-05-10

## 2016-05-10 MED ORDER — MAGNESIUM SULFATE 2 GM/50ML IV SOLN
2.0000 g | Freq: Once | INTRAVENOUS | Status: AC
  Administered 2016-05-10: 2 g via INTRAVENOUS
  Filled 2016-05-10: qty 50

## 2016-05-10 MED ORDER — LACTATED RINGERS IV BOLUS (SEPSIS)
1000.0000 mL | Freq: Once | INTRAVENOUS | Status: AC
  Administered 2016-05-10: 1000 mL via INTRAVENOUS

## 2016-05-10 MED ORDER — ENOXAPARIN SODIUM 30 MG/0.3ML ~~LOC~~ SOLN
30.0000 mg | Freq: Every day | SUBCUTANEOUS | Status: DC
  Administered 2016-05-10: 30 mg via SUBCUTANEOUS
  Filled 2016-05-10: qty 0.3

## 2016-05-10 NOTE — Progress Notes (Signed)
PROGRESS NOTE    Bailey [name]  QQI:[id number] DOB: 10/04/1950 DOA: 05/05/2016 PCP: [name], MD    Brief Narrative:  66 yo female with past medical history of T2dm, presented with abdominal pain, constant and severe. On the initial physical examination, she was found dehydrated, tender abdomen to palpation, CT positive for small bowel perforation. Surgery consulted with recommendations for conservative care. Started on IV antibiotics and IV fluids, NPO. Follow CT with signs of bowel obstruction, large jejunal diverticulosis with no signs of perforation. Patient underwent laparoscopic small bowel resection. Worsening renal function.    Assessment & Plan:   Principal Problem:   Mass of small intestine s/p SB resection 05/09/2016 Active Problems:   Poorly controlled type 2 diabetes mellitus with complication (HCC)   Normocytic anemia   UTI (urinary tract infection)   Dehydration   SBO (small bowel obstruction)   Diverticular disease of jejunum s/p resection 05/09/2016   Acute renal failure (ARF) ([place])  1. Small bowel obstruction. Patient sp laparoscopic small bowel resection, NG tube in place, drain 400 cc over last 24 hours, green liquid. Will continue NPO for now, continue hydration and will add antiacid therapy.   2. T2DM. NPO, glucose cover and monitoringwith insulin sliding scale, capillary glucose 120, 120, 121. No basal insulin for now due to risk of hypoglycemia.  3. Anemia multifactorial. Hb and hct stable at 8,4. No need for blood transfusion, will check on iron panel.   4. Urine infection. Patient off antibiotic therapy, had full 4 days. Will continue close monitoring.    5. AKI. Continue worsening renal function, now with cr at 2.24, urine output documented 610 ml over last 24 hours plus 400 cc loss per ng tube. Will increase IV fluids to 125 cc per hour to compensate losses including insensible losses. Follow on urine Na, K, Osmolality, suspected pre-renal.  Serum bicarbonate 18.    DVT prophylaxis:enoxaparin  Code Status:full  Family Communication:No family at the bedside  Disposition Plan:Home   Consultants:  Surgery   Procedures: 03/17/18LAPAROSCOPY DIAGNOSTIC                 LAPAROSCOPIC LYSIS OF AHDESIONS                LAPAROSCOPIC SMALL BOWEL RESECTION x 2   Antimicrobials:     Subjective: Patient with no chest pain or dyspnea, no significant abdominal pain, continue to be NPO. No fever or chills.   Objective: Vitals:   05/10/16 0149 05/10/16 0525 05/10/16 0800 05/10/16 1009  BP: (!) 107/42 (!) 137/55  127/64  Pulse: 82 79  81  Resp: $Remo'14 14  16  'MRDSe$ Temp: 98.9 F (37.2 C) 98.1 F (36.7 C)  98.3 F (36.8 C)  TempSrc: Oral Oral  Oral  SpO2: 97% 98% 98% 100%  Weight:      Height:        Intake/Output Summary (Last 24 hours) at 05/10/16 1244 Last data filed at 05/10/16 1000  Gross per 24 hour  Intake          1778.33 ml  Output             1280 ml  Net           498.33 ml   Filed Weights   05/06/16 0500 05/07/16 0500 05/09/16 0521  Weight: 63.5 kg (139 lb 15.9 oz) 66.6 kg (146 lb 13.2 oz) 71.3 kg (157 lb 3 oz)    Examination:  General exam: deconditioned E ENT: mild pallor,  no icterus. NG tube in place with green fluid drainage.   Respiratory system: Clear to auscultation. Respiratory effort normal. Mild decreased breath sounds at bases, no wheezing, rales or rhonchi.  Cardiovascular system: S1 & S2 heard, RRR. No JVD, murmurs, rubs, gallops or clicks. No pedal edema. Gastrointestinal system: Abdomen is distended and tympanic, soft. No organomegaly or masses felt. Very decreased  bowel sounds heard. Central nervous system: Alert and oriented. No focal neurological deficits. Extremities: Symmetric 5 x 5 power. Skin: No rashes, lesions or ulcers     Data Reviewed: I have personally reviewed following labs and imaging studies  CBC:  Recent Labs Lab 05/06/16 0450 05/07/16 0654 05/08/16 0421  05/09/16 0509 05/10/16 0438  WBC 8.3 5.9 5.9 7.5 8.7  NEUTROABS  --   --  3.7 5.4 6.4  HGB 9.2* 9.1* 9.6* 10.2* 8.4*  HCT 28.1* 28.0* 28.7* 30.2* 24.7*  MCV 85.4 86.4 86.2 87.0 86.4  PLT 185 181 218 285 960   Basic Metabolic Panel:  Recent Labs Lab 05/06/16 0450 05/07/16 0448 05/08/16 0421 05/09/16 0509 05/10/16 0438  NA 134* 142 144 141 143  K 3.8 3.9 3.8 3.5 3.4*  CL 110 117* 115* 104 109  CO2 17* 16* 17* 17* 18*  GLUCOSE 186* 117* 108* 153* 122*  BUN 24* 22* 23* 25* 28*  CREATININE 0.97 1.00 1.17* 1.68* 2.24*  CALCIUM 7.9* 8.2* 8.5* 8.8* 7.9*  MG  --   --   --   --  1.7   GFR: Estimated Creatinine Clearance: 23.6 mL/min (A) (by C-G formula based on SCr of 2.24 mg/dL (H)). Liver Function Tests:  Recent Labs Lab 05/05/16 1923 05/06/16 0450  AST 11* 10*  ALT 11* 9*  ALKPHOS 115 90  BILITOT 0.7 0.6  PROT 6.9 6.1*  ALBUMIN 3.3* 2.8*    Recent Labs Lab 05/05/16 1923  LIPASE 15   No results for input(s): AMMONIA in the last 168 hours. Coagulation Profile:  Recent Labs Lab 05/06/16 0450  INR 1.23   Cardiac Enzymes: No results for input(s): CKTOTAL, CKMB, CKMBINDEX, TROPONINI in the last 168 hours. BNP (last 3 results) No results for input(s): PROBNP in the last 8760 hours. HbA1C: No results for input(s): HGBA1C in the last 72 hours. CBG:  Recent Labs Lab 05/09/16 2121 05/09/16 2355 05/10/16 0357 05/10/16 0723 05/10/16 1154  GLUCAP 157* 140* 120* 120* 121*   Lipid Profile: No results for input(s): CHOL, HDL, LDLCALC, TRIG, CHOLHDL, LDLDIRECT in the last 72 hours. Thyroid Function Tests: No results for input(s): TSH, T4TOTAL, FREET4, T3FREE, THYROIDAB in the last 72 hours. Anemia Panel: No results for input(s): VITAMINB12, FOLATE, FERRITIN, TIBC, IRON, RETICCTPCT in the last 72 hours. Sepsis Labs: No results for input(s): PROCALCITON, LATICACIDVEN in the last 168 hours.  Recent Results (from the past 240 hour(s))  Blood culture (routine x  2)     Status: None (Preliminary result)   Collection Time: 05/05/16 10:15 PM  Result Value Ref Range Status   Specimen Description BLOOD LEFT ANTECUBITAL  Final   Special Requests BOTTLES DRAWN AEROBIC AND ANAEROBIC 5 ML EA  Final   Culture   Final    NO GROWTH 4 DAYS Performed at [facility], [address], [place], [place] [address]    Report Status PENDING  Incomplete  Blood culture (routine x 2)     Status: None (Preliminary result)   Collection Time: 05/05/16 10:25 PM  Result Value Ref Range Status   Specimen Description  BLOOD L WRIST  Final   Special Requests IN PEDIATRIC BOTTLE 2ML  Final   Culture   Final    NO GROWTH 4 DAYS Performed at [facility] [address] [place], [place] [address]    Report Status PENDING  Incomplete  Urine culture     Status: Abnormal   Collection Time: 05/05/16 10:27 PM  Result Value Ref Range Status   Specimen Description URINE, RANDOM  Final   Special Requests NONE  Final   Culture MULTIPLE SPECIES PRESENT, SUGGEST RECOLLECTION (A)  Final   Report Status 05/07/2016 FINAL  Final  Surgical pcr screen     Status: Abnormal   Collection Time: 05/09/16  1:51 AM  Result Value Ref Range Status   MRSA, PCR NEGATIVE NEGATIVE Final   Staphylococcus aureus POSITIVE (A) NEGATIVE Final    Comment:        The Xpert SA Assay (FDA approved for NASAL specimens in patients over 63 years of age), is one component of a comprehensive surveillance program.  Test performance has been validated by [facility] for patients greater than or equal to 32 year old. It is not intended to diagnose infection nor to guide or monitor treatment.          Radiology Studies: No results found.      Scheduled Meds: . enoxaparin (LOVENOX) injection  30 mg Subcutaneous QHS  . insulin aspart  0-15 Units Subcutaneous Q4H  . insulin aspart  7 Units Subcutaneous Once  . lactated ringers  1,000 mL Intravenous Once  . lip balm  1 application  Topical BID   Continuous Infusions: . lactated ringers 100 mL/hr at 05/10/16 0819     LOS: 5 days        Betania Dizon Gerome Apley, MD [facility] Pager [phone]  If 7PM-7AM, please contact night-coverage [email or web address] Password [facility] 05/10/2016, 12:44 PM

## 2016-05-10 NOTE — Progress Notes (Signed)
Bailey  [name] [place], [place] [address] Phone: [phone] FAX: [phone]   [name] [id number] Feb 14, 1951    Problem List:   Principal Problem:   Mass of small intestine s/p SB resection 05/09/2016 Active Problems:   Poorly controlled type 2 diabetes mellitus with complication (HCC)   Normocytic anemia   UTI (urinary tract infection)   Dehydration   SBO (small bowel obstruction)   Diverticular disease of jejunum s/p resection 05/09/2016   1 Day Post-Op  05/09/2016  POST-OPERATIVE DIAGNOSIS:    Proximal jejunal diverticulosis with mesenteric mass (Probable jejunal diverticulitis versus tumor) causing internal hernia & SBO  Small bowel fecalization at transition point  PROCEDURE:    LAPAROSCOPY DIAGNOSTIC LAPAROSCOPIC LYSIS OF AHDESIONS LAPAROSCOPIC SMALL BOWEL RESECTION x 2  SURGEON:  Adin Hector, MD  Assessment  Recovering  Plan:  -IVF for nonoliguric ARF -DM control - SSI & lantus -NGT until flatus -follow off IV Abx since no peritonitis intraop & focal inflammation removed -VTE prophylaxis- SCDs, etc -mobilize as tolerated to help recovery  Adin Hector, M.D., F.A.C.S. Gastrointestinal and Minimally Invasive Surgery [facility] [address], [place], [place] [address] [phone] Main / Paging   05/10/2016  CARE TEAM:  PCP: [name], MD  Outpatient Care Team: Patient Care Team: [name], MD as PCP - General (Family Medicine) Michael Boston, MD as Consulting Physician (General Surgery) Sydnee Cabal, MD as Consulting Physician (Orthopedic Surgery)  Inpatient Treatment Team: Treatment Team: Attending Provider: Tawni Millers, MD; Rounding Team: Md Edison Pace, MD; Rounding Team: Redmond Baseman, MD; Registered Nurse: Arnold Long, RN; Technician: Leda Quail, NT; Registered Nurse: Oleta Mouse, RN;  Registered Nurse: Roanna Epley, RN; Registered Nurse: Bailey Mech, RN; Registered Nurse: Mertha Baars, RN; Registered Nurse: Eliot Ford, RN  Subjective:  She denies much pain.  Got up and walked.  Claims she is urinating fine.  Objective:  Vital signs:  Vitals:   05/09/16 1801 05/09/16 2200 05/10/16 0149 05/10/16 0525  BP: (!) 126/51 (!) 131/51 (!) 107/42 (!) 137/55  Pulse: 84 85 82 79  Resp: 14 16 14 14   Temp: 98 F (36.7 C) 98.9 F (37.2 C) 98.9 F (37.2 C) 98.1 F (36.7 C)  TempSrc: Oral Oral Oral Oral  SpO2: 100% 100% 97% 98%  Weight:      Height:        Last BM Date: 05/04/16  Intake/Output   Yesterday:  03/17 0701 - 03/18 0700 In: 5778.3 [I.V.:5778.3] Out: 1110 [Urine:610; Emesis/NG output:400; Blood:100] This shift:  No intake/output data recorded.  Bowel function:  Flatus: No  BM:  No  NGT Drain: Bilious   Physical Exam:  General: Pt awake/alert/oriented x4 in No acute distress Eyes: PERRL, normal EOM.  Sclera clear.  No icterus Neuro: CN II-XII intact w/o focal sensory/motor deficits. Lymph: No head/neck/groin lymphadenopathy Psych:  No delerium/psychosis/paranoia HENT: Normocephalic, Mucus membranes moist.  No thrush Neck: Supple, No tracheal deviation Chest: No chest wall pain w good excursion CV:  Pulses intact.  Regular rhythm MS: Normal AROM mjr joints.  No obvious deformity Abdomen: Soft.  Mildy distended.  Mildly tender at incisions only.  No evidence of peritonitis.  No incarcerated hernias. Ext:  SCDs BLE.  No mjr edema.  No cyanosis Skin: No petechiae / purpura  Results:   Labs: Results for orders placed or performed during the hospital encounter of 05/05/16 (from the  past 48 hour(s))  Glucose, capillary     Status: None   Collection Time: 05/08/16  8:10 AM  Result Value Ref Range   Glucose-Capillary 98 65 - 99 mg/dL  Glucose, capillary     Status: Abnormal   Collection Time: 05/08/16 11:59 AM  Result  Value Ref Range   Glucose-Capillary 104 (H) 65 - 99 mg/dL  Glucose, capillary     Status: Abnormal   Collection Time: 05/08/16  4:12 PM  Result Value Ref Range   Glucose-Capillary 117 (H) 65 - 99 mg/dL  Glucose, capillary     Status: Abnormal   Collection Time: 05/08/16  7:59 PM  Result Value Ref Range   Glucose-Capillary 141 (H) 65 - 99 mg/dL  Glucose, capillary     Status: None   Collection Time: 05/08/16 11:53 PM  Result Value Ref Range   Glucose-Capillary 87 65 - 99 mg/dL  Surgical pcr screen     Status: Abnormal   Collection Time: 05/09/16  1:51 AM  Result Value Ref Range   MRSA, PCR NEGATIVE NEGATIVE   Staphylococcus aureus POSITIVE (A) NEGATIVE    Comment:        The Xpert SA Assay (FDA approved for NASAL specimens in patients over 25 years of age), is one component of a comprehensive surveillance program.  Test performance has been validated by [facility] for patients greater than or equal to 75 year old. It is not intended to diagnose infection nor to guide or monitor treatment.   CBC with Differential/Platelet     Status: Abnormal   Collection Time: 05/09/16  5:09 AM  Result Value Ref Range   WBC 7.5 4.0 - 10.5 K/uL   RBC 3.47 (L) 3.87 - 5.11 MIL/uL   Hemoglobin 10.2 (L) 12.0 - 15.0 g/dL   HCT 30.2 (L) 36.0 - 46.0 %   MCV 87.0 78.0 - 100.0 fL   MCH 29.4 26.0 - 34.0 pg   MCHC 33.8 30.0 - 36.0 g/dL   RDW 13.6 11.5 - 15.5 %   Platelets 285 150 - 400 K/uL   Neutrophils Relative % 73 %   Neutro Abs 5.4 1.7 - 7.7 K/uL   Lymphocytes Relative 21 %   Lymphs Abs 1.6 0.7 - 4.0 K/uL   Monocytes Relative 5 %   Monocytes Absolute 0.4 0.1 - 1.0 K/uL   Eosinophils Relative 1 %   Eosinophils Absolute 0.1 0.0 - 0.7 K/uL   Basophils Relative 0 %   Basophils Absolute 0.0 0.0 - 0.1 K/uL  Basic metabolic panel     Status: Abnormal   Collection Time: 05/09/16  5:09 AM  Result Value Ref Range   Sodium 141 135 - 145 mmol/L   Potassium 3.5 3.5 - 5.1 mmol/L   Chloride 104  101 - 111 mmol/L   CO2 17 (L) 22 - 32 mmol/L   Glucose, Bld 153 (H) 65 - 99 mg/dL   BUN 25 (H) 6 - 20 mg/dL   Creatinine, Ser 1.68 (H) 0.44 - 1.00 mg/dL   Calcium 8.8 (L) 8.9 - 10.3 mg/dL   GFR calc non Af Amer 30 (L) >60 mL/min   GFR calc Af Amer 35 (L) >60 mL/min    Comment: (NOTE) The eGFR has been calculated using the CKD EPI equation. This calculation has not been validated in all clinical situations. eGFR's persistently <60 mL/min signify possible Chronic Kidney Disease.    Anion gap 20 (H) 5 - 15  Glucose, capillary  Status: Abnormal   Collection Time: 05/09/16  5:14 AM  Result Value Ref Range   Glucose-Capillary 137 (H) 65 - 99 mg/dL  Glucose, capillary     Status: Abnormal   Collection Time: 05/09/16 10:48 AM  Result Value Ref Range   Glucose-Capillary 161 (H) 65 - 99 mg/dL  Glucose, capillary     Status: Abnormal   Collection Time: 05/09/16 12:20 PM  Result Value Ref Range   Glucose-Capillary 152 (H) 65 - 99 mg/dL  Glucose, capillary     Status: Abnormal   Collection Time: 05/09/16  4:05 PM  Result Value Ref Range   Glucose-Capillary 149 (H) 65 - 99 mg/dL  Glucose, capillary     Status: Abnormal   Collection Time: 05/09/16  9:21 PM  Result Value Ref Range   Glucose-Capillary 157 (H) 65 - 99 mg/dL   Comment 1 Notify RN   Glucose, capillary     Status: Abnormal   Collection Time: 05/09/16 11:55 PM  Result Value Ref Range   Glucose-Capillary 140 (H) 65 - 99 mg/dL  Glucose, capillary     Status: Abnormal   Collection Time: 05/10/16  3:57 AM  Result Value Ref Range   Glucose-Capillary 120 (H) 65 - 99 mg/dL  Basic metabolic panel     Status: Abnormal   Collection Time: 05/10/16  4:38 AM  Result Value Ref Range   Sodium 143 135 - 145 mmol/L   Potassium 3.4 (L) 3.5 - 5.1 mmol/L   Chloride 109 101 - 111 mmol/L   CO2 18 (L) 22 - 32 mmol/L   Glucose, Bld 122 (H) 65 - 99 mg/dL   BUN 28 (H) 6 - 20 mg/dL   Creatinine, Ser 2.24 (H) 0.44 - 1.00 mg/dL   Calcium 7.9  (L) 8.9 - 10.3 mg/dL   GFR calc non Af Amer 21 (L) >60 mL/min   GFR calc Af Amer 25 (L) >60 mL/min    Comment: (NOTE) The eGFR has been calculated using the CKD EPI equation. This calculation has not been validated in all clinical situations. eGFR's persistently <60 mL/min signify possible Chronic Kidney Disease.    Anion gap 16 (H) 5 - 15  Magnesium     Status: None   Collection Time: 05/10/16  4:38 AM  Result Value Ref Range   Magnesium 1.7 1.7 - 2.4 mg/dL  CBC with Differential/Platelet     Status: Abnormal   Collection Time: 05/10/16  4:38 AM  Result Value Ref Range   WBC 8.7 4.0 - 10.5 K/uL   RBC 2.86 (L) 3.87 - 5.11 MIL/uL   Hemoglobin 8.4 (L) 12.0 - 15.0 g/dL   HCT 24.7 (L) 36.0 - 46.0 %   MCV 86.4 78.0 - 100.0 fL   MCH 29.4 26.0 - 34.0 pg   MCHC 34.0 30.0 - 36.0 g/dL   RDW 13.8 11.5 - 15.5 %   Platelets 225 150 - 400 K/uL   Neutrophils Relative % 72 %   Neutro Abs 6.4 1.7 - 7.7 K/uL   Lymphocytes Relative 20 %   Lymphs Abs 1.7 0.7 - 4.0 K/uL   Monocytes Relative 7 %   Monocytes Absolute 0.6 0.1 - 1.0 K/uL   Eosinophils Relative 1 %   Eosinophils Absolute 0.0 0.0 - 0.7 K/uL   Basophils Relative 0 %   Basophils Absolute 0.0 0.0 - 0.1 K/uL  Glucose, capillary     Status: Abnormal   Collection Time: 05/10/16  7:23 AM  Result Value Ref Range  Glucose-Capillary 120 (H) 65 - 99 mg/dL    Imaging / Studies: Ct Abdomen Pelvis W Contrast  Result Date: 05/08/2016 CLINICAL DATA:  Contained small bowel perforation seen on previous CT. EXAM: CT ABDOMEN AND PELVIS WITH CONTRAST TECHNIQUE: Multidetector CT imaging of the abdomen and pelvis was performed using the standard protocol following bolus administration of intravenous contrast. CONTRAST:  148m ISOVUE-300 IOPAMIDOL (ISOVUE-300) INJECTION 61% COMPARISON:  05/05/2016 FINDINGS: Lower chest:  Subsegmental atelectasis noted lung bases. Hepatobiliary: No focal abnormality within the liver parenchyma. Gallbladder is markedly  distended. No intrahepatic or extrahepatic biliary dilation. Pancreas: Pancreas diffusely atrophic. Spleen: No splenomegaly. No focal mass lesion. Adrenals/Urinary Tract: No adrenal nodule or mass. Kidneys unremarkable. No evidence for hydroureter. Bladder is decompressed. Gas in the bladder lumen presumably secondary to recent instrumentation. Stomach/Bowel: Stomach is moderately distended. Large duodenal diverticulum evident. In the interval since prior study, small-bowel loops have become diffusely fluid filled and dilated, measuring up to 3.6 cm diameter. On today's exam, multiple large diverticuli of the small bowel are seen in the proximal small bowel loops. The area that appeared to represent contained abscess on the previous study is no longer present. However, the area on the prior study felt to represent intussusception is now seen just to the right and caudal of the umbilicus and represents a primary small bowel lesion. This lesion is the site of small bowel obstruction with loops distal to this location being decompressed. The terminal ileum is normal. The appendix is normal. The colon shows diffuse diverticular change and is collapsed along its entire length. Vascular/Lymphatic: No abdominal aortic aneurysm. The portal vein and superior mesenteric vein are patent. Splenic vein is patent. There is no gastrohepatic or hepatoduodenal ligament lymphadenopathy. No intraperitoneal or retroperitoneal lymphadenopathy. No pelvic sidewall lymphadenopathy. Reproductive: The uterus has normal CT imaging appearance. There is no adnexal mass. Other: Free intraperitoneal fluid is identified around the liver, spleen, in the para colic gutters, and in the central pelvis. Since the prior study, the patient has developed fairly prominent interloop mesenteric fluid associated with the dilated small bowel loops. Musculoskeletal: Bone windows reveal no worrisome lytic or sclerotic osseous lesions. IMPRESSION: 1. In the  interval since the prior study, the patient has developed a small bowel obstruction secondary to an intrinsic small-bowel lesion located in the mid small bowel, near the jejunal ileal junction probably in the proximal to mid ileum. This lesion is visible on image 64 of series 2 today, represents circumferential wall thickening along a length of about 3 cm and is just deep to the rectus fascia to the right and caudal of the umbilicus. Small bowel distal to this lesion is decompressed as is the entire colon. 2. Patient is noted to have multiple relatively large jejunal diverticuli. As there is no residual of the apparent contained perforation seen on the prior study, this appearance on the prior study was probably related to a large debris filled small bowel diverticulum. 3. Interval development of small to moderate volume interloop mesenteric and free intraperitoneal fluid. 4. Diffuse colonic diverticulosis. I personally discussed these findings by telephone with Dr. ACathlean Sauerat approximately 10:50 a.m. on 05/08/2016. Electronically Signed   By: EMisty StanleyM.D.   On: 05/08/2016 10:54    Medications / Allergies: per chart  Antibiotics: Anti-infectives    Start     Dose/Rate Route Frequency Ordered Stop   05/09/16 2200  cefoTEtan (CEFOTAN) 2 g in dextrose 5 % 50 mL IVPB     2 g 100  mL/hr over 30 Minutes Intravenous Every 12 hours 05/09/16 1228 05/09/16 2230   05/09/16 0600  cefoTEtan (CEFOTAN) 2 g in dextrose 5 % 50 mL IVPB  Status:  Discontinued     2 g 100 mL/hr over 30 Minutes Intravenous On call to O.R. 05/08/16 2137 05/08/16 2142   05/09/16 0600  cefoTEtan (CEFOTAN) 2 g in dextrose 5 % 50 mL IVPB  Status:  Discontinued     2 g 100 mL/hr over 30 Minutes Intravenous On call to O.R. 05/08/16 2141 05/09/16 1211   05/08/16 2200  cefTRIAXone (ROCEPHIN) 2 g in dextrose 5 % 50 mL IVPB  Status:  Discontinued    Comments:  Pharmacy may adjust dosing strength / duration / interval for maximal efficacy   2  g 100 mL/hr over 30 Minutes Intravenous Every 24 hours 05/08/16 2119 05/09/16 1228   05/06/16 0600  Ampicillin-Sulbactam (UNASYN) 3 g in sodium chloride 0.9 % 100 mL IVPB  Status:  Discontinued     3 g 200 mL/hr over 30 Minutes Intravenous Every 6 hours 05/06/16 0220 05/08/16 2119   05/05/16 2215  piperacillin-tazobactam (ZOSYN) IVPB 3.375 g     3.375 g 100 mL/hr over 30 Minutes Intravenous  Once 05/05/16 2211 05/05/16 2303        Note: Portions of this report may have been transcribed using voice recognition software. Every effort was made to ensure accuracy; however, inadvertent computerized transcription errors may be present.   Any transcriptional errors that result from this process are unintentional.     Adin Hector, M.D., F.A.C.S. Gastrointestinal and Minimally Invasive Surgery [facility] [address], [place], [place] [address] [phone] Main / Paging   05/10/2016

## 2016-05-11 DIAGNOSIS — N189 Chronic kidney disease, unspecified: Secondary | ICD-10-CM

## 2016-05-11 HISTORY — DX: Chronic kidney disease, unspecified: N18.9

## 2016-05-11 LAB — CBC
HCT: 23.3 % — ABNORMAL LOW (ref 36.0–46.0)
Hemoglobin: 7.9 g/dL — ABNORMAL LOW (ref 12.0–15.0)
MCH: 29.3 pg (ref 26.0–34.0)
MCHC: 33.9 g/dL (ref 30.0–36.0)
MCV: 86.3 fL (ref 78.0–100.0)
Platelets: 197 K/uL (ref 150–400)
RBC: 2.7 MIL/uL — ABNORMAL LOW (ref 3.87–5.11)
RDW: 14 % (ref 11.5–15.5)
WBC: 7.9 K/uL (ref 4.0–10.5)

## 2016-05-11 LAB — BASIC METABOLIC PANEL
Anion gap: 10 (ref 5–15)
BUN: 25 mg/dL — ABNORMAL HIGH (ref 6–20)
CHLORIDE: 110 mmol/L (ref 101–111)
CO2: 25 mmol/L (ref 22–32)
CREATININE: 1.47 mg/dL — AB (ref 0.44–1.00)
Calcium: 8 mg/dL — ABNORMAL LOW (ref 8.9–10.3)
GFR, EST AFRICAN AMERICAN: 41 mL/min — AB (ref >60)
GFR, EST NON AFRICAN AMERICAN: 36 mL/min — AB (ref >60)
Glucose, Bld: 112 mg/dL — ABNORMAL HIGH (ref 65–99)
Potassium: 3.4 mmol/L — ABNORMAL LOW (ref 3.5–5.1)
SODIUM: 145 mmol/L (ref 135–145)

## 2016-05-11 LAB — GLUCOSE, CAPILLARY
GLUCOSE-CAPILLARY: 105 mg/dL — AB (ref 65–99)
GLUCOSE-CAPILLARY: 108 mg/dL — AB (ref 65–99)
GLUCOSE-CAPILLARY: 96 mg/dL (ref 65–99)
Glucose-Capillary: 102 mg/dL — ABNORMAL HIGH (ref 65–99)
Glucose-Capillary: 115 mg/dL — ABNORMAL HIGH (ref 65–99)
Glucose-Capillary: 78 mg/dL (ref 65–99)

## 2016-05-11 LAB — MAGNESIUM: MAGNESIUM: 2.2 mg/dL (ref 1.7–2.4)

## 2016-05-11 LAB — OSMOLALITY, URINE: Osmolality, Ur: 460 mOsm/kg (ref 300–900)

## 2016-05-11 MED ORDER — ENOXAPARIN SODIUM 40 MG/0.4ML ~~LOC~~ SOLN
40.0000 mg | Freq: Every day | SUBCUTANEOUS | Status: DC
  Administered 2016-05-11 – 2016-05-15 (×5): 40 mg via SUBCUTANEOUS
  Filled 2016-05-11 (×5): qty 0.4

## 2016-05-11 NOTE — Progress Notes (Signed)
Spoke with patient at bedside. She states she lives at home with her spouse, independent of ADL's. Has a pharmacy she uses regularly, PCP is [name], she has not been to her PCP recently but has seen her in the last year. States she stop taking some of her diabetes medicine, did not like the side effects. She has a meter and knows how to check her blood sugar. Has continued to take her Metformin. Verbalizes understanding of the need to treat her diabetes and keep blood sugar under control. No further HH needs identified, will continue to follow.

## 2016-05-11 NOTE — Care Management Important Message (Signed)
Important Message  Patient Details  Name: Bailey [name] MRN: [id number] Date of Birth: 23-Feb-1951   Medicare Important Message Given:  Yes    Kerin Salen 05/11/2016, 1:03 [place] Message  Patient Details  Name: Bailey [name] MRN: [id number] Date of Birth: 11-07-1950   Medicare Important Message Given:  Yes    Kerin Salen 05/11/2016, 1:03 PM

## 2016-05-11 NOTE — Progress Notes (Signed)
2 Days Post-Op  Subjective: Reports small bm this am; ambulated multiple times yesterday; pain ok  Objective: Vital signs in last 24 hours: Temp:  [97.7 F (36.5 C)-98.3 F (36.8 C)] 97.7 F (36.5 C) (03/19 0424) Pulse Rate:  [74-82] 74 (03/19 0424) Resp:  [14-16] 14 (03/19 0424) BP: (127-145)/(59-64) 145/59 (03/19 0424) SpO2:  [96 %-100 %] 96 % (03/19 0424) Last BM Date:  (PTA)  Intake/Output from previous day: 03/18 0701 - 03/19 0700 In: 4175 [I.V.:2825; NG/GT:350; IV Piggyback:1000] Out: 1800 [Urine:1350; Emesis/NG output:450] Intake/Output this shift: No intake/output data recorded.  Asleep, easily awakens approp cta ant Reg Soft, very mild distension, +BS, mild approp TTP; dressing c/d/i  Lab Results:   Recent Labs  05/10/16 0438 05/11/16 0418  WBC 8.7 7.9  HGB 8.4* 7.9*  HCT 24.7* 23.3*  PLT 225 197   BMET  Recent Labs  05/10/16 0438 05/11/16 0418  NA 143 145  K 3.4* 3.4*  CL 109 110  CO2 18* 25  GLUCOSE 122* 112*  BUN 28* 25*  CREATININE 2.24* 1.47*  CALCIUM 7.9* 8.0*   PT/INR No results for input(s): LABPROT, INR in the last 72 hours. ABG No results for input(s): PHART, HCO3 in the last 72 hours.  Invalid input(s): PCO2, PO2  Studies/Results: No results found.  Anti-infectives: Anti-infectives    Start     Dose/Rate Route Frequency Ordered Stop   05/09/16 2200  cefoTEtan (CEFOTAN) 2 g in dextrose 5 % 50 mL IVPB     2 g 100 mL/hr over 30 Minutes Intravenous Every 12 hours 05/09/16 1228 05/09/16 2230   05/09/16 0600  cefoTEtan (CEFOTAN) 2 g in dextrose 5 % 50 mL IVPB  Status:  Discontinued     2 g 100 mL/hr over 30 Minutes Intravenous On call to O.R. 05/08/16 2137 05/08/16 2142   05/09/16 0600  cefoTEtan (CEFOTAN) 2 g in dextrose 5 % 50 mL IVPB  Status:  Discontinued     2 g 100 mL/hr over 30 Minutes Intravenous On call to O.R. 05/08/16 2141 05/09/16 1211   05/08/16 2200  cefTRIAXone (ROCEPHIN) 2 g in dextrose 5 % 50 mL IVPB  Status:   Discontinued    Comments:  Pharmacy may adjust dosing strength / duration / interval for maximal efficacy   2 g 100 mL/hr over 30 Minutes Intravenous Every 24 hours 05/08/16 2119 05/09/16 1228   05/06/16 0600  Ampicillin-Sulbactam (UNASYN) 3 g in sodium chloride 0.9 % 100 mL IVPB  Status:  Discontinued     3 g 200 mL/hr over 30 Minutes Intravenous Every 6 hours 05/06/16 0220 05/08/16 2119   05/05/16 2215  piperacillin-tazobactam (ZOSYN) IVPB 3.375 g     3.375 g 100 mL/hr over 30 Minutes Intravenous  Once 05/05/16 2211 05/05/16 2303      Assessment/Plan: s/p Procedure(s): LAPAROSCOPY DIAGNOSTIC, LYSIS OF ADHESIONS, SMALL BOWEL RESECTION X 2 (N/A) LAPAROSCOPIC SMALL BOWEL RESECTION (N/A) POD 2 by Dr Johney Maine  Cont NG tube today given proximal SB resection; hopefully clamp/remove in AM Ambulate, IS pulm toilet  Leighton Ruff. Redmond Pulling, MD, FACS General, Bariatric, & Minimally Invasive Surgery [facility], [place]   LOS: 6 days    Gayland Curry 05/11/2016

## 2016-05-11 NOTE — Progress Notes (Signed)
Correction on IV removal for rt wrist.  It was not bruised but it did look red.  It was previously saline locked and when it was flushed, it began leaking.

## 2016-05-11 NOTE — Progress Notes (Signed)
PROGRESS NOTE    Bailey [name]  YIF:[id number] DOB: 05-14-1950 DOA: 05/05/2016 PCP: [name], MD   Brief Narrative:  66 yo female with past medical history of T2dm, presented with abdominal pain, constant and severe. On the initial physical examination, she was found dehydrated, tender abdomen to palpation, CT positive for small bowel perforation. Surgery consulted with recommendations for conservative care. Started on IV antibiotics and IV fluids, NPO. Follow CT with signs of bowel obstruction, large jejunal diverticulosis with no signs of perforation. Patient underwent laparoscopic small bowel resection. AKI due to hypovolemia.    Assessment & Plan:   Principal Problem:   Mass of small intestine s/p SB resection 05/09/2016 Active Problems:   Poorly controlled type 2 diabetes mellitus with complication (HCC)   Normocytic anemia   UTI (urinary tract infection)   Dehydration   SBO (small bowel obstruction)   Diverticular disease of jejunum s/p resection 05/09/2016   Acute renal failure (ARF) ([place])   1. Small bowel obstruction. sp laparoscopic small bowel resection, NG tube in place, drain 450 cc over last 24 hours, green liquid. Will continue NPO for now, IV hydration and  antiacid therapy.   2. T2DM. NPO, glucose cover and monitoringwith insulin sliding scale, capillary glucose 105-108-96. No basal insulin for now due to risk of hypoglycemia.  3. Anemia multifactorial. Hb and hct stable at 7.9. No transfusion for now.   4. Urine infection. Resolved.    5. AKI. Renal function with cr trending down, will follow on renal panel in am, continue IV fluids with balanced electrolyte solutions at 125 cc/hr, Serum bicarb at 25 and K at 4. Avoid hypotension and nephrotoxic medications. Follow renal panel in am.   DVT prophylaxis:enoxaparin  Code Status:full  Family Communication:No family at the bedside  Disposition Plan:Home   Consultants:  Surgery    Procedures: 03/17/18LAPAROSCOPY DIAGNOSTIC                 LAPAROSCOPIC LYSIS OF AHDESIONS                LAPAROSCOPIC SMALL BOWEL RESECTION x 2   Antimicrobials  Subjective: Patient with no significant abdominal pain. Ng tube in place. No dyspnea or chest pain.   Objective: Vitals:   05/10/16 1341 05/10/16 2055 05/11/16 0424 05/11/16 1406  BP: (!) 135/59 133/60 (!) 145/59 (!) 164/69  Pulse: 80 82 74 80  Resp:  $Remo'16 14 15  'kPXPQ$ Temp: 98.2 F (36.8 C) 98.2 F (36.8 C) 97.7 F (36.5 C) 98 F (36.7 C)  TempSrc: Oral Oral Oral Oral  SpO2: 99% 96% 96% 98%  Weight:      Height:        Intake/Output Summary (Last 24 hours) at 05/11/16 1849 Last data filed at 05/11/16 1809  Gross per 24 hour  Intake             3250 ml  Output             1450 ml  Net             1800 ml   Filed Weights   05/06/16 0500 05/07/16 0500 05/09/16 0521  Weight: 63.5 kg (139 lb 15.9 oz) 66.6 kg (146 lb 13.2 oz) 71.3 kg (157 lb 3 oz)    Examination:  General exam: deconditioned E ENT: mild pallor, oral mucosa moist.   NG tube in place.  Respiratory system: Clear to auscultation. Respiratory effort normal. No wheezing, rales or rhonchi Cardiovascular system: S1 & S2 heard,  RRR. No JVD, murmurs, rubs, gallops or clicks. No pedal edema. Gastrointestinal system: Abdomen is nondistended, soft and nontender. No organomegaly or masses felt. Normal bowel sounds heard. Central nervous system: Alert and oriented. No focal neurological deficits. Extremities: Symmetric 5 x 5 power. Skin: No rashes, lesions or ulcers     Data Reviewed: I have personally reviewed following labs and imaging studies  CBC:  Recent Labs Lab 05/07/16 0654 05/08/16 0421 05/09/16 0509 05/10/16 0438 05/11/16 0418  WBC 5.9 5.9 7.5 8.7 7.9  NEUTROABS  --  3.7 5.4 6.4  --   HGB 9.1* 9.6* 10.2* 8.4* 7.9*  HCT 28.0* 28.7* 30.2* 24.7* 23.3*  MCV 86.4 86.2 87.0 86.4 86.3  PLT 181 218 285 225 197   Basic Metabolic  Panel:  Recent Labs Lab 05/07/16 0448 05/08/16 0421 05/09/16 0509 05/10/16 0438 05/11/16 0418  NA 142 144 141 143 145  K 3.9 3.8 3.5 3.4* 3.4*  CL 117* 115* 104 109 110  CO2 16* 17* 17* 18* 25  GLUCOSE 117* 108* 153* 122* 112*  BUN 22* 23* 25* 28* 25*  CREATININE 1.00 1.17* 1.68* 2.24* 1.47*  CALCIUM 8.2* 8.5* 8.8* 7.9* 8.0*  MG  --   --   --  1.7 2.2   GFR: Estimated Creatinine Clearance: 35.9 mL/min (A) (by C-G formula based on SCr of 1.47 mg/dL (H)). Liver Function Tests:  Recent Labs Lab 05/05/16 1923 05/06/16 0450  AST 11* 10*  ALT 11* 9*  ALKPHOS 115 90  BILITOT 0.7 0.6  PROT 6.9 6.1*  ALBUMIN 3.3* 2.8*    Recent Labs Lab 05/05/16 1923  LIPASE 15   No results for input(s): AMMONIA in the last 168 hours. Coagulation Profile:  Recent Labs Lab 05/06/16 0450  INR 1.23   Cardiac Enzymes: No results for input(s): CKTOTAL, CKMB, CKMBINDEX, TROPONINI in the last 168 hours. BNP (last 3 results) No results for input(s): PROBNP in the last 8760 hours. HbA1C: No results for input(s): HGBA1C in the last 72 hours. CBG:  Recent Labs Lab 05/10/16 2329 05/11/16 0342 05/11/16 0724 05/11/16 1153 05/11/16 1707  GLUCAP 115* 115* 105* 108* 96   Lipid Profile: No results for input(s): CHOL, HDL, LDLCALC, TRIG, CHOLHDL, LDLDIRECT in the last 72 hours. Thyroid Function Tests: No results for input(s): TSH, T4TOTAL, FREET4, T3FREE, THYROIDAB in the last 72 hours. Anemia Panel: No results for input(s): VITAMINB12, FOLATE, FERRITIN, TIBC, IRON, RETICCTPCT in the last 72 hours. Sepsis Labs: No results for input(s): PROCALCITON, LATICACIDVEN in the last 168 hours.  Recent Results (from the past 240 hour(s))  Blood culture (routine x 2)     Status: None   Collection Time: 05/05/16 10:15 PM  Result Value Ref Range Status   Specimen Description BLOOD LEFT ANTECUBITAL  Final   Special Requests BOTTLES DRAWN AEROBIC AND ANAEROBIC 5 ML EA  Final   Culture   Final     NO GROWTH 5 DAYS Performed at [facility], [address] [place], [place] [address]    Report Status 05/10/2016 FINAL  Final  Blood culture (routine x 2)     Status: None   Collection Time: 05/05/16 10:25 PM  Result Value Ref Range Status   Specimen Description BLOOD L WRIST  Final   Special Requests IN PEDIATRIC BOTTLE  Final   Culture   Final    NO GROWTH 5 DAYS Performed at [facility], [address] [place], [place] [address]    Report  Status 05/10/2016 FINAL  Final  Urine culture     Status: Abnormal   Collection Time: 05/05/16 10:27 PM  Result Value Ref Range Status   Specimen Description URINE, RANDOM  Final   Special Requests NONE  Final   Culture MULTIPLE SPECIES PRESENT, SUGGEST RECOLLECTION (A)  Final   Report Status 05/07/2016 FINAL  Final  Surgical pcr screen     Status: Abnormal   Collection Time: 05/09/16  1:51 AM  Result Value Ref Range Status   MRSA, PCR NEGATIVE NEGATIVE Final   Staphylococcus aureus POSITIVE (A) NEGATIVE Final    Comment:        The Xpert SA Assay (FDA approved for NASAL specimens in patients over 29 years of age), is one component of a comprehensive surveillance program.  Test performance has been validated by [facility] for patients greater than or equal to 45 year old. It is not intended to diagnose infection nor to guide or monitor treatment.          Radiology Studies: No results found.      Scheduled Meds: . enoxaparin (LOVENOX) injection  40 mg Subcutaneous QHS  . insulin aspart  0-15 Units Subcutaneous Q4H  . insulin aspart  7 Units Subcutaneous Once  . lip balm  1 application Topical BID   Continuous Infusions: . lactated ringers 125 mL/hr at 05/11/16 1800     LOS: 6 days        Hartford Maulden Gerome Apley, MD [facility] Pager [phone]  If 7PM-7AM, please contact night-coverage [email or web address] Password [facility] 05/11/2016, 6:49 PM

## 2016-05-12 LAB — CBC WITH DIFFERENTIAL/PLATELET
Basophils Absolute: 0 10*3/uL (ref 0.0–0.1)
Basophils Relative: 0 %
Eosinophils Absolute: 0.2 10*3/uL (ref 0.0–0.7)
Eosinophils Relative: 4 %
HCT: 24 % — ABNORMAL LOW (ref 36.0–46.0)
Hemoglobin: 7.8 g/dL — ABNORMAL LOW (ref 12.0–15.0)
LYMPHS ABS: 1.7 10*3/uL (ref 0.7–4.0)
LYMPHS PCT: 28 %
MCH: 27.7 pg (ref 26.0–34.0)
MCHC: 32.5 g/dL (ref 30.0–36.0)
MCV: 85.1 fL (ref 78.0–100.0)
MONO ABS: 0.4 10*3/uL (ref 0.1–1.0)
MONOS PCT: 7 %
NEUTROS ABS: 3.7 10*3/uL (ref 1.7–7.7)
Neutrophils Relative %: 61 %
Platelets: 214 10*3/uL (ref 150–400)
RBC: 2.82 MIL/uL — ABNORMAL LOW (ref 3.87–5.11)
RDW: 13.7 % (ref 11.5–15.5)
WBC: 6.1 10*3/uL (ref 4.0–10.5)

## 2016-05-12 LAB — BASIC METABOLIC PANEL
Anion gap: 10 (ref 5–15)
BUN: 14 mg/dL (ref 6–20)
CO2: 27 mmol/L (ref 22–32)
Calcium: 7.8 mg/dL — ABNORMAL LOW (ref 8.9–10.3)
Chloride: 107 mmol/L (ref 101–111)
Creatinine, Ser: 0.87 mg/dL (ref 0.44–1.00)
GFR calc Af Amer: >60 mL/min (ref >60)
GFR calc non Af Amer: >60 mL/min (ref >60)
Glucose, Bld: 87 mg/dL (ref 65–99)
POTASSIUM: 2.7 mmol/L — AB (ref 3.5–5.1)
Sodium: 144 mmol/L (ref 135–145)

## 2016-05-12 LAB — GLUCOSE, CAPILLARY
GLUCOSE-CAPILLARY: 89 mg/dL (ref 65–99)
GLUCOSE-CAPILLARY: 91 mg/dL (ref 65–99)
GLUCOSE-CAPILLARY: 94 mg/dL (ref 65–99)
Glucose-Capillary: 89 mg/dL (ref 65–99)
Glucose-Capillary: 87 mg/dL (ref 65–99)
Glucose-Capillary: 95 mg/dL (ref 65–99)

## 2016-05-12 MED ORDER — POTASSIUM CHLORIDE 10 MEQ/100ML IV SOLN
10.0000 meq | INTRAVENOUS | Status: DC
  Filled 2016-05-12 (×3): qty 100

## 2016-05-12 MED ORDER — POTASSIUM CHLORIDE 10 MEQ/100ML IV SOLN
10.0000 meq | INTRAVENOUS | Status: AC
  Administered 2016-05-12 (×3): 10 meq via INTRAVENOUS
  Filled 2016-05-12 (×3): qty 100

## 2016-05-12 MED ORDER — SODIUM CHLORIDE 0.9 % IV SOLN: 30.0000 meq | INTRAVENOUS | Status: DC

## 2016-05-12 MED ORDER — POTASSIUM CHLORIDE 2 MEQ/ML IV SOLN: 30.0000 meq | Freq: Once | INTRAVENOUS | Status: DC

## 2016-05-12 NOTE — Progress Notes (Signed)
Port abdominal dressings removed per order for POD#3; pt assisted to shower with soap and water applied to abdominal wound sites; will continue to monitor

## 2016-05-12 NOTE — Progress Notes (Signed)
PROGRESS NOTE    Bailey [name]  ION:[id number] DOB: December 06, 1950 DOA: 05/05/2016 PCP: [name], MD    Brief Narrative:  66 yo female with past medical history of T2dm, presented with abdominal pain, constant and severe. On the initial physical examination, she was found dehydrated, tender abdomen to palpation, CT positive for small bowel perforation. Surgery consulted with recommendations for conservative care. Started on IV antibiotics and IV fluids, NPO. Follow CT with signs of bowel obstruction, large jejunal diverticulosis with no signs of perforation. Patient underwent laparoscopic small bowel resection. AKI due to hypovolemia, resolving.    Assessment & Plan:   Principal Problem:   Mass of small intestine s/p SB resection 05/09/2016 Active Problems:   Poorly controlled type 2 diabetes mellitus with complication (HCC)   Normocytic anemia   UTI (urinary tract infection)   Dehydration   SBO (small bowel obstruction)   Diverticular disease of jejunum s/p resection 05/09/2016   Acute renal failure (ARF) ([place])   1. Small bowel obstruction. sp laparoscopic small bowel resection, NG tube has been removed. Will continue to follow on surgery recommendations.   2. T2DM. NPO for now, will continue glucose cover and monitoringwith insulin sliding scale, capillary glucose 87-94-91. Hold on basal insulin for now due to risk of hypoglycemia.  3. Anemia multifactorial. Hb and hct stable at 7.8. No transfusion for now. No signs of active bleeding.    4. Urine infection. Resolved. Completed antibiotic therapy.   5. AKI. Renal function with improved cr down to 0.87, will decrease rate of IV fluids to avoid volume overload, will follow on renal function in am, replete k with IV kcl, follow on renal panel in am, avoid hypotension or nephrotoxic medications. Note urine output  At 2050 ml over last 24 hours.   DVT prophylaxis:enoxaparin  Code Status:full  Family Communication:No  family at the bedside  Disposition Plan:Home   Consultants:  Surgery   Procedures: 03/17/18LAPAROSCOPY DIAGNOSTIC  LAPAROSCOPIC LYSIS OF AHDESIONS LAPAROSCOPIC SMALL BOWEL RESECTION x 2   Antimicrobials Subjective: NG tube has been removed, no nausea or vomiting. No dyspnea or chest pain.   Objective: Vitals:   05/11/16 2121 05/12/16 0500 05/12/16 0530 05/12/16 1140  BP: (!) 158/58  (!) 155/56 (!) 162/62  Pulse: 78  69 68  Resp: $Remo'16  16 15  'PfiXW$ Temp: 98.3 F (36.8 C)  98.4 F (36.9 C) 97.7 F (36.5 C)  TempSrc: Oral  Oral Oral  SpO2: 94%  97% 97%  Weight:  72.9 kg (160 lb 11.5 oz)    Height:        Intake/Output Summary (Last 24 hours) at 05/12/16 1356 Last data filed at 05/12/16 1350  Gross per 24 hour  Intake             2750 ml  Output             3000 ml  Net             -250 ml   Filed Weights   05/07/16 0500 05/09/16 0521 05/12/16 0500  Weight: 66.6 kg (146 lb 13.2 oz) 71.3 kg (157 lb 3 oz) 72.9 kg (160 lb 11.5 oz)    Examination:  General exam: Not in pain or dyspnea E ENT: mild pallor, oral mucosa moist.  Respiratory system: Clear to auscultation. Respiratory effort normal. No wheezing, rales or rhonchi. Cardiovascular system: S1 & S2 heard, RRR. No JVD, murmurs, rubs, gallops or clicks. No pedal edema. Gastrointestinal system: Abdomen is nondistended, soft and  nontender. No organomegaly or masses felt. Normal bowel sounds heard. Central nervous system: Alert and oriented. No focal neurological deficits. Extremities: Symmetric 5 x 5 power. Skin: No rashes, lesions or ulcers    Data Reviewed: I have personally reviewed following labs and imaging studies  CBC:  Recent Labs Lab 05/08/16 0421 05/09/16 0509 05/10/16 0438 05/11/16 0418 05/12/16 0513  WBC 5.9 7.5 8.7 7.9 6.1  NEUTROABS 3.7 5.4 6.4  --  3.7  HGB 9.6* 10.2* 8.4* 7.9* 7.8*  HCT 28.7* 30.2* 24.7* 23.3* 24.0*  MCV 86.2 87.0 86.4 86.3 85.1  PLT 218  285 225 197 454   Basic Metabolic Panel:  Recent Labs Lab 05/08/16 0421 05/09/16 0509 05/10/16 0438 05/11/16 0418 05/12/16 0513  NA 144 141 143 145 144  K 3.8 3.5 3.4* 3.4* 2.7*  CL 115* 104 109 110 107  CO2 17* 17* 18* 25 27  GLUCOSE 108* 153* 122* 112* 87  BUN 23* 25* 28* 25* 14  CREATININE 1.17* 1.68* 2.24* 1.47* 0.87  CALCIUM 8.5* 8.8* 7.9* 8.0* 7.8*  MG  --   --  1.7 2.2  --    GFR: Estimated Creatinine Clearance: 61.4 mL/min (by C-G formula based on SCr of 0.87 mg/dL). Liver Function Tests:  Recent Labs Lab 05/05/16 1923 05/06/16 0450  AST 11* 10*  ALT 11* 9*  ALKPHOS 115 90  BILITOT 0.7 0.6  PROT 6.9 6.1*  ALBUMIN 3.3* 2.8*    Recent Labs Lab 05/05/16 1923  LIPASE 15   No results for input(s): AMMONIA in the last 168 hours. Coagulation Profile:  Recent Labs Lab 05/06/16 0450  INR 1.23   Cardiac Enzymes: No results for input(s): CKTOTAL, CKMB, CKMBINDEX, TROPONINI in the last 168 hours. BNP (last 3 results) No results for input(s): PROBNP in the last 8760 hours. HbA1C: No results for input(s): HGBA1C in the last 72 hours. CBG:  Recent Labs Lab 05/11/16 1945 05/11/16 2359 05/12/16 0409 05/12/16 0744 05/12/16 1132  GLUCAP 102* 78 89 87 94   Lipid Profile: No results for input(s): CHOL, HDL, LDLCALC, TRIG, CHOLHDL, LDLDIRECT in the last 72 hours. Thyroid Function Tests: No results for input(s): TSH, T4TOTAL, FREET4, T3FREE, THYROIDAB in the last 72 hours. Anemia Panel: No results for input(s): VITAMINB12, FOLATE, FERRITIN, TIBC, IRON, RETICCTPCT in the last 72 hours. Sepsis Labs: No results for input(s): PROCALCITON, LATICACIDVEN in the last 168 hours.  Recent Results (from the past 240 hour(s))  Blood culture (routine x 2)     Status: None   Collection Time: 05/05/16 10:15 PM  Result Value Ref Range Status   Specimen Description BLOOD LEFT ANTECUBITAL  Final   Special Requests BOTTLES DRAWN AEROBIC AND ANAEROBIC 5 ML EA  Final    Culture   Final    NO GROWTH 5 DAYS Performed at [facility], [address] [place], [place] [address]    Report Status 05/10/2016 FINAL  Final  Blood culture (routine x 2)     Status: None   Collection Time: 05/05/16 10:25 PM  Result Value Ref Range Status   Specimen Description BLOOD L WRIST  Final   Special Requests IN PEDIATRIC BOTTLE 2ML  Final   Culture   Final    NO GROWTH 5 DAYS Performed at [facility], [place] [address] [place], [place] [address]    Report Status 05/10/2016 FINAL  Final  Urine culture     Status: Abnormal   Collection Time: 05/05/16 10:27 PM  Result  Value Ref Range Status   Specimen Description URINE, RANDOM  Final   Special Requests NONE  Final   Culture MULTIPLE SPECIES PRESENT, SUGGEST RECOLLECTION (A)  Final   Report Status 05/07/2016 FINAL  Final  Surgical pcr screen     Status: Abnormal   Collection Time: 05/09/16  1:51 AM  Result Value Ref Range Status   MRSA, PCR NEGATIVE NEGATIVE Final   Staphylococcus aureus POSITIVE (A) NEGATIVE Final    Comment:        The Xpert SA Assay (FDA approved for NASAL specimens in patients over 58 years of age), is one component of a comprehensive surveillance program.  Test performance has been validated by [facility] for patients greater than or equal to 82 year old. It is not intended to diagnose infection nor to guide or monitor treatment.          Radiology Studies: No results found.      Scheduled Meds: . enoxaparin (LOVENOX) injection  40 mg Subcutaneous QHS  . insulin aspart  0-15 Units Subcutaneous Q4H  . insulin aspart  7 Units Subcutaneous Once  . lip balm  1 application Topical BID   Continuous Infusions: . lactated ringers 125 mL/hr at 05/12/16 1000     LOS: 7 days       Elmo Shumard Gerome Apley, MD [facility] Pager [phone]  If 7PM-7AM, please contact night-coverage [email or web address] Password [email or web address] 05/12/2016, 1:56 PM

## 2016-05-12 NOTE — Progress Notes (Signed)
3 Days Post-Op  Subjective: Some flatus. 1 bm. Not much pain. walked  Objective: Vital signs in last 24 hours: Temp:  [98 F (36.7 C)-98.4 F (36.9 C)] 98.4 F (36.9 C) (03/20 0530) Pulse Rate:  [69-80] 69 (03/20 0530) Resp:  [15-16] 16 (03/20 0530) BP: (155-164)/(56-69) 155/56 (03/20 0530) SpO2:  [94 %-98 %] 97 % (03/20 0530) Weight:  [72.9 kg (160 lb 11.5 oz)] 72.9 kg (160 lb 11.5 oz) (03/20 0500) Last BM Date: 05/11/16  Intake/Output from previous day: 03/19 0701 - 03/20 0700 In: 2750 [I.V.:2750] Out: 2400 [Urine:2050; Emesis/NG output:350] Intake/Output this shift: No intake/output data recorded.  On BS commode. Tech at [place] Soft, mild distension; mild approp TTP; hypoBS, dressing ok  Lab Results:   Recent Labs  05/11/16 0418 05/12/16 0513  WBC 7.9 6.1  HGB 7.9* 7.8*  HCT 23.3* 24.0*  PLT 197 214   BMET  Recent Labs  05/11/16 0418 05/12/16 0513  NA 145 144  K 3.4* 2.7*  CL 110 107  CO2 25 27  GLUCOSE 112* 87  BUN 25* 14  CREATININE 1.47* 0.87  CALCIUM 8.0* 7.8*   PT/INR No results for input(s): LABPROT, INR in the last 72 hours. ABG No results for input(s): PHART, HCO3 in the last 72 hours.  Invalid input(s): PCO2, PO2  Studies/Results: No results found.  Anti-infectives: Anti-infectives    Start     Dose/Rate Route Frequency Ordered Stop   05/09/16 2200  cefoTEtan (CEFOTAN) 2 g in dextrose 5 % 50 mL IVPB     2 g 100 mL/hr over 30 Minutes Intravenous Every 12 hours 05/09/16 1228 05/09/16 2230   05/09/16 0600  cefoTEtan (CEFOTAN) 2 g in dextrose 5 % 50 mL IVPB  Status:  Discontinued     2 g 100 mL/hr over 30 Minutes Intravenous On call to O.R. 05/08/16 2137 05/08/16 2142   05/09/16 0600  cefoTEtan (CEFOTAN) 2 g in dextrose 5 % 50 mL IVPB  Status:  Discontinued     2 g 100 mL/hr over 30 Minutes Intravenous On call to O.R. 05/08/16 2141 05/09/16 1211   05/08/16 2200  cefTRIAXone (ROCEPHIN) 2 g in dextrose 5 % 50 mL IVPB  Status:   Discontinued    Comments:  Pharmacy may adjust dosing strength / duration / interval for maximal efficacy   2 g 100 mL/hr over 30 Minutes Intravenous Every 24 hours 05/08/16 2119 05/09/16 1228   05/06/16 0600  Ampicillin-Sulbactam (UNASYN) 3 g in sodium chloride 0.9 % 100 mL IVPB  Status:  Discontinued     3 g 200 mL/hr over 30 Minutes Intravenous Every 6 hours 05/06/16 0220 05/08/16 2119   05/05/16 2215  piperacillin-tazobactam (ZOSYN) IVPB 3.375 g     3.375 g 100 mL/hr over 30 Minutes Intravenous  Once 05/05/16 2211 05/05/16 2303      Assessment/Plan: s/p Procedure(s): LAPAROSCOPY DIAGNOSTIC, LYSIS OF ADHESIONS, SMALL BOWEL RESECTION X 2 (N/A) LAPAROSCOPIC SMALL BOWEL RESECTION (N/A) POD 3 by Dr Johney Maine  Dc ng tube Possible clears later today if no n/v/bloating  Hypokalemia - replace potassium, gave 30mEq Repeat lytes, Mag in am Cont chemical vte prophylaxis Anemia - hgb stable ambualte  Leighton Ruff. Redmond Pulling, MD, FACS General, Bariatric, & Minimally Invasive Surgery [facility], [place]   LOS: 7 days    Gayland Curry 05/12/2016

## 2016-05-13 ENCOUNTER — Encounter (HOSPITAL_COMMUNITY): Payer: Self-pay | Admitting: Surgery

## 2016-05-13 DIAGNOSIS — E1165 Type 2 diabetes mellitus with hyperglycemia: Secondary | ICD-10-CM

## 2016-05-13 LAB — CBC WITH DIFFERENTIAL/PLATELET
BASOS ABS: 0 10*3/uL (ref 0.0–0.1)
BASOS PCT: 0 %
EOS ABS: 0.3 10*3/uL (ref 0.0–0.7)
Eosinophils Relative: 4 %
HCT: 25.1 % — ABNORMAL LOW (ref 36.0–46.0)
HEMOGLOBIN: 8.5 g/dL — AB (ref 12.0–15.0)
Lymphocytes Relative: 34 %
Lymphs Abs: 2.3 10*3/uL (ref 0.7–4.0)
MCH: 29.3 pg (ref 26.0–34.0)
MCHC: 33.9 g/dL (ref 30.0–36.0)
MCV: 86.6 fL (ref 78.0–100.0)
Monocytes Absolute: 0.5 10*3/uL (ref 0.1–1.0)
Monocytes Relative: 8 %
NEUTROS PCT: 54 %
Neutro Abs: 3.8 10*3/uL (ref 1.7–7.7)
Platelets: 240 10*3/uL (ref 150–400)
RBC: 2.9 MIL/uL — AB (ref 3.87–5.11)
RDW: 13.9 % (ref 11.5–15.5)
WBC: 6.9 10*3/uL (ref 4.0–10.5)

## 2016-05-13 LAB — BASIC METABOLIC PANEL
ANION GAP: 14 (ref 5–15)
BUN: 10 mg/dL (ref 6–20)
CALCIUM: 7.9 mg/dL — AB (ref 8.9–10.3)
CO2: 25 mmol/L (ref 22–32)
Chloride: 103 mmol/L (ref 101–111)
Creatinine, Ser: 0.82 mg/dL (ref 0.44–1.00)
GFR calc Af Amer: >60 mL/min (ref >60)
Glucose, Bld: 91 mg/dL (ref 65–99)
POTASSIUM: 2.9 mmol/L — AB (ref 3.5–5.1)
SODIUM: 142 mmol/L (ref 135–145)

## 2016-05-13 LAB — GLUCOSE, CAPILLARY
GLUCOSE-CAPILLARY: 81 mg/dL (ref 65–99)
GLUCOSE-CAPILLARY: 125 mg/dL — AB (ref 65–99)
GLUCOSE-CAPILLARY: 148 mg/dL — AB (ref 65–99)
Glucose-Capillary: 114 mg/dL — ABNORMAL HIGH (ref 65–99)
Glucose-Capillary: 157 mg/dL — ABNORMAL HIGH (ref 65–99)

## 2016-05-13 LAB — MAGNESIUM: Magnesium: 1.6 mg/dL — ABNORMAL LOW (ref 1.7–2.4)

## 2016-05-13 MED ORDER — MAGNESIUM SULFATE 2 GM/50ML IV SOLN
2.0000 g | Freq: Once | INTRAVENOUS | Status: AC
  Administered 2016-05-13: 2 g via INTRAVENOUS
  Filled 2016-05-13: qty 50

## 2016-05-13 MED ORDER — OXYCODONE HCL 5 MG PO TABS: 5.0000 mg | ORAL_TABLET | ORAL | Status: DC | PRN

## 2016-05-13 MED ORDER — HYDROMORPHONE HCL 1 MG/ML IJ SOLN: 0.5000 mg | INTRAMUSCULAR | Status: DC | PRN

## 2016-05-13 MED ORDER — CHLORHEXIDINE GLUCONATE CLOTH 2 % EX PADS
6.0000 | MEDICATED_PAD | Freq: Every day | CUTANEOUS | Status: DC
  Administered 2016-05-13 – 2016-05-15 (×3): 6 via TOPICAL

## 2016-05-13 MED ORDER — POTASSIUM CHLORIDE 2 MEQ/ML IV SOLN: 30.0000 meq | Freq: Once | INTRAVENOUS | Status: DC

## 2016-05-13 MED ORDER — ACETAMINOPHEN 325 MG PO TABS: 650.0000 mg | ORAL_TABLET | Freq: Four times a day (QID) | ORAL | Status: DC | PRN

## 2016-05-13 MED ORDER — MUPIROCIN 2 % EX OINT
1.0000 "application " | TOPICAL_OINTMENT | Freq: Two times a day (BID) | CUTANEOUS | Status: DC
  Administered 2016-05-13 – 2016-05-15 (×6): 1 via NASAL
  Filled 2016-05-13: qty 22

## 2016-05-13 MED ORDER — POTASSIUM CHLORIDE CRYS ER 20 MEQ PO TBCR
20.0000 meq | EXTENDED_RELEASE_TABLET | Freq: Three times a day (TID) | ORAL | Status: AC
  Administered 2016-05-13 (×3): 20 meq via ORAL
  Filled 2016-05-13 (×3): qty 1

## 2016-05-13 MED ORDER — POTASSIUM CHLORIDE 10 MEQ/100ML IV SOLN
10.0000 meq | INTRAVENOUS | Status: DC
  Filled 2016-05-13 (×3): qty 100

## 2016-05-13 NOTE — Progress Notes (Signed)
PT Cancellation Note  Patient Details Name: Bailey [name] MRN: [id number] DOB: [birth year]   Cancelled Treatment:    Reason Eval/Treat Not Completed: PT screened, no needs identified, will sign off (patient observed ambulating  with no assistance and nursing student.  no further needs per patient.)   Marcelino Freestone PT [phone]  05/13/2016, 3:10 PM

## 2016-05-13 NOTE — Progress Notes (Signed)
Patient ID: Bailey [name], female   DOB: [birth year], 66 y.o.   MRN: [id number]  [facility] Surgery Progress Note  4 Days Post-Op  Subjective: States that her abdomen is a little sore this morning, but not painful. States that she has been moving around more and this may be the cause. Ambulated in halls 3 times yesterday. She is passing flatus and has had 2 BM's. She is also still burping some as well. Tolerating clear liquids. Denies n/v.  Objective: Vital signs in last 24 hours: Temp:  [97.6 F (36.4 C)-98.2 F (36.8 C)] 98.2 F (36.8 C) (03/21 0445) Pulse Rate:  [65-70] 67 (03/21 0445) Resp:  [15-18] 16 (03/21 0445) BP: (152-175)/(62-68) 155/66 (03/21 0445) SpO2:  [95 %-100 %] 97 % (03/21 0445) Last BM Date: 05/12/16  Intake/Output from previous day: 03/20 0701 - 03/21 0700 In: 2418.8 [I.V.:2418.8] Out: 2025 [Urine:1825; Emesis/NG output:200] Intake/Output this shift: No intake/output data recorded.  PE: Gen:  Alert, NAD, pleasant Pulm:  Effort normal, CTAB Abd: Soft, NT/ND, +BS, lap incisions C/D/I  Lab Results:   Recent Labs  05/12/16 0513 05/13/16 0455  WBC 6.1 6.9  HGB 7.8* 8.5*  HCT 24.0* 25.1*  PLT 214 240   BMET  Recent Labs  05/12/16 0513 05/13/16 0455  NA 144 142  K 2.7* 2.9*  CL 107 103  CO2 27 25  GLUCOSE 87 91  BUN 14 10  CREATININE 0.87 0.82  CALCIUM 7.8* 7.9*   PT/INR No results for input(s): LABPROT, INR in the last 72 hours. CMP     Component Value Date/Time   NA 142 05/13/2016 0455   K 2.9 (L) 05/13/2016 0455   CL 103 05/13/2016 0455   CO2 25 05/13/2016 0455   GLUCOSE 91 05/13/2016 0455   BUN 10 05/13/2016 0455   CREATININE 0.82 05/13/2016 0455   CREATININE 0.74 02/10/2014 1220   CALCIUM 7.9 (L) 05/13/2016 0455   PROT 6.1 (L) 05/06/2016 0450   ALBUMIN 2.8 (L) 05/06/2016 0450   AST 10 (L) 05/06/2016 0450   ALT 9 (L) 05/06/2016 0450   ALKPHOS 90 05/06/2016 0450   BILITOT 0.6 05/06/2016 0450   GFRNONAA >60  05/13/2016 0455   GFRAA >60 05/13/2016 0455   Lipase     Component Value Date/Time   LIPASE 15 05/05/2016 1923       Studies/Results: No results found.  Anti-infectives: Anti-infectives    Start     Dose/Rate Route Frequency Ordered Stop   05/09/16 2200  cefoTEtan (CEFOTAN) 2 g in dextrose 5 % 50 mL IVPB     2 g 100 mL/hr over 30 Minutes Intravenous Every 12 hours 05/09/16 1228 05/09/16 2230   05/09/16 0600  cefoTEtan (CEFOTAN) 2 g in dextrose 5 % 50 mL IVPB  Status:  Discontinued     2 g 100 mL/hr over 30 Minutes Intravenous On call to O.R. 05/08/16 2137 05/08/16 2142   05/09/16 0600  cefoTEtan (CEFOTAN) 2 g in dextrose 5 % 50 mL IVPB  Status:  Discontinued     2 g 100 mL/hr over 30 Minutes Intravenous On call to O.R. 05/08/16 2141 05/09/16 1211   05/08/16 2200  cefTRIAXone (ROCEPHIN) 2 g in dextrose 5 % 50 mL IVPB  Status:  Discontinued    Comments:  Pharmacy may adjust dosing strength / duration / interval for maximal efficacy   2 g 100 mL/hr over 30 Minutes Intravenous Every 24 hours 05/08/16 2119 05/09/16 1228   05/06/16 0600  Ampicillin-Sulbactam (  UNASYN) 3 g in sodium chloride 0.9 % 100 mL IVPB  Status:  Discontinued     3 g 200 mL/hr over 30 Minutes Intravenous Every 6 hours 05/06/16 0220 05/08/16 2119   05/05/16 2215  piperacillin-tazobactam (ZOSYN) IVPB 3.375 g     3.375 g 100 mL/hr over 30 Minutes Intravenous  Once 05/05/16 2211 05/05/16 2303       Assessment/Plan s/p Procedure(s): LAPAROSCOPY DIAGNOSTIC, LYSIS OF ADHESIONS, SMALL BOWEL RESECTION X 2 (N/A) LAPAROSCOPIC SMALL BOWEL RESECTION (N/A) 3/17 by Dr Johney Maine - POD 4 - path: DIVERTICULI WITH ASSOCIATED ULCERATION AND ACUTE INFLAMMATION, NO EVIDENCE OF MALIGNANCY  DM poorly controlled Chronic normocytic anemia UTI  FEN - IVF, full liquids VTE - lovenox  Plan - advance to full liquids. Continue to ambulate. Replace potassium and magnesium.    LOS: 8 days    Jerrye Beavers , [facility] 05/13/2016, 9:13 AM Pager: [phone] Consults: [phone] Mon-Fri 7:00 am-4:30 pm Sat-Sun 7:00 am-11:30 am

## 2016-05-13 NOTE — Progress Notes (Signed)
PROGRESS NOTE    Bailey [name]  PIR:[id number] DOB: 1950/07/08 DOA: 05/05/2016 PCP: [name], MD   Chief Complaint  Patient presents with  . lower abd pain    Brief Narrative:  66 yo female with past medical history of T2dm, presented with abdominal pain, constant and severe. On the initial physical examination, she was found dehydrated, tender abdomen to palpation, CT positive for small bowel perforation. Surgery consulted with recommendations for conservative care. Started on IV antibiotics and IV fluids, NPO. Follow CT with signs of bowel obstruction, large jejunal diverticulosis with no signs of perforation. Patient underwent laparoscopic small bowel resection. AKI due to hypovolemia, resolving.  Assessment & Plan   Small bowel obstruction -General surgery consulted and appreciated, s/p laparoscopic small bowel resection -Placed on full liquids today per surgery -Continue pain control -Spoke with Dr. Redmond Pulling, patient will advance diet slowly  Diabetes mellitus, type II -Continue ISS and CBG monitoring  Normocytic Anemia multifactorial -Baseline hemoglobin 9-10 -hemoglobin currently 8.5 (dropped to 7.8) -Currently has no signs of active bleeding -Possibly dilutional -Continue to monitor CBC  Urine infection  -Resolved. Completed antibiotic therapy.   Acute kidney injury -Resolved -Creatinine peaked at 2.24 -Currently 0.82  Hypokalemia/hypomagnesemia  -continue to replace and monitor BMP and magnesium  DVT Prophylaxis  lovenox  Code Status: Full  Family Communication: None at bedside  Disposition Plan: Admitted. Pending advancement in diet.  Consultants General surgery  Procedures  LAPAROSCOPY DIAGNOSTIC, LYSIS OF ADHESIONS, SMALL BOWEL RESECTION X 2 (N/A) LAPAROSCOPIC SMALL BOWEL RESECTION (N/A)  Antibiotics   Anti-infectives    Start     Dose/Rate Route Frequency Ordered Stop   05/09/16 2200  cefoTEtan (CEFOTAN) 2 g in dextrose 5 % 50 mL IVPB      2 g 100 mL/hr over 30 Minutes Intravenous Every 12 hours 05/09/16 1228 05/09/16 2230   05/09/16 0600  cefoTEtan (CEFOTAN) 2 g in dextrose 5 % 50 mL IVPB  Status:  Discontinued     2 g 100 mL/hr over 30 Minutes Intravenous On call to O.R. 05/08/16 2137 05/08/16 2142   05/09/16 0600  cefoTEtan (CEFOTAN) 2 g in dextrose 5 % 50 mL IVPB  Status:  Discontinued     2 g 100 mL/hr over 30 Minutes Intravenous On call to O.R. 05/08/16 2141 05/09/16 1211   05/08/16 2200  cefTRIAXone (ROCEPHIN) 2 g in dextrose 5 % 50 mL IVPB  Status:  Discontinued    Comments:  Pharmacy may adjust dosing strength / duration / interval for maximal efficacy   2 g 100 mL/hr over 30 Minutes Intravenous Every 24 hours 05/08/16 2119 05/09/16 1228   05/06/16 0600  Ampicillin-Sulbactam (UNASYN) 3 g in sodium chloride 0.9 % 100 mL IVPB  Status:  Discontinued     3 g 200 mL/hr over 30 Minutes Intravenous Every 6 hours 05/06/16 0220 05/08/16 2119   05/05/16 2215  piperacillin-tazobactam (ZOSYN) IVPB 3.375 g     3.375 g 100 mL/hr over 30 Minutes Intravenous  Once 05/05/16 2211 05/05/16 2303      Subjective:   Vallorie Niccoli seen and examined today. States she is feeling better today.  Does have some abdominal soreness with movement. Denies nausea or vomiting. Was able to have a bowel movement and had gas as well as belching. Denies chest pain, shortness of breath.    Objective:   Vitals:   05/12/16 1800 05/12/16 2131 05/13/16 0445 05/13/16 1100  BP: (!) 175/68 (!) 152/67 (!) 155/66 (!) 155/65  Pulse:  65 70 67 67  Resp:  $Remo'18 16 16  'ThAPZ$ Temp: 97.6 F (36.4 C) 97.7 F (36.5 C) 98.2 F (36.8 C) 98.2 F (36.8 C)  TempSrc: Oral Oral Oral Oral  SpO2: 100% 95% 97%   Weight:      Height:        Intake/Output Summary (Last 24 hours) at 05/13/16 1429 Last data filed at 05/13/16 1012  Gross per 24 hour  Intake          2018.75 ml  Output             1525 ml  Net           493.75 ml   Filed Weights   05/07/16 0500  05/09/16 0521 05/12/16 0500  Weight: 66.6 kg (146 lb 13.2 oz) 71.3 kg (157 lb 3 oz) 72.9 kg (160 lb 11.5 oz)    Exam  General: Well developed, well nourished, NAD, appears stated age  HEENT: NCAT, mucous membranes moist.   Cardiovascular: S1 S2 auscultated, no rubs, murmurs or gallops. Regular rate and rhythm.  Respiratory: Clear to auscultation bilaterally with equal chest rise  Abdomen: Soft, nontender, mildly distended, + bowel sounds, incisions clean  Extremities: warm dry without cyanosis clubbing or edema  Neuro: AAOx3, nonfocal  Psych: Normal affect and demeanor with intact judgement and insight   Data Reviewed: I have personally reviewed following labs and imaging studies  CBC:  Recent Labs Lab 05/08/16 0421 05/09/16 0509 05/10/16 0438 05/11/16 0418 05/12/16 0513 05/13/16 0455  WBC 5.9 7.5 8.7 7.9 6.1 6.9  NEUTROABS 3.7 5.4 6.4  --  3.7 3.8  HGB 9.6* 10.2* 8.4* 7.9* 7.8* 8.5*  HCT 28.7* 30.2* 24.7* 23.3* 24.0* 25.1*  MCV 86.2 87.0 86.4 86.3 85.1 86.6  PLT 218 285 225 197 214 287   Basic Metabolic Panel:  Recent Labs Lab 05/09/16 0509 05/10/16 0438 05/11/16 0418 05/12/16 0513 05/13/16 0455  NA 141 143 145 144 142  K 3.5 3.4* 3.4* 2.7* 2.9*  CL 104 109 110 107 103  CO2 17* 18* $Remov'25 27 25  'JpGQZN$ GLUCOSE 153* 122* 112* 87 91  BUN 25* 28* 25* 14 10  CREATININE 1.68* 2.24* 1.47* 0.87 0.82  CALCIUM 8.8* 7.9* 8.0* 7.8* 7.9*  MG  --  1.7 2.2  --  1.6*   GFR: Estimated Creatinine Clearance: 65.2 mL/min (by C-G formula based on SCr of 0.82 mg/dL). Liver Function Tests: No results for input(s): AST, ALT, ALKPHOS, BILITOT, PROT, ALBUMIN in the last 168 hours. No results for input(s): LIPASE, AMYLASE in the last 168 hours. No results for input(s): AMMONIA in the last 168 hours. Coagulation Profile: No results for input(s): INR, PROTIME in the last 168 hours. Cardiac Enzymes: No results for input(s): CKTOTAL, CKMB, CKMBINDEX, TROPONINI in the last 168  hours. BNP (last 3 results) No results for input(s): PROBNP in the last 8760 hours. HbA1C: No results for input(s): HGBA1C in the last 72 hours. CBG:  Recent Labs Lab 05/12/16 1544 05/12/16 2007 05/12/16 2357 05/13/16 0739 05/13/16 1200  GLUCAP 91 95 89 81 125*   Lipid Profile: No results for input(s): CHOL, HDL, LDLCALC, TRIG, CHOLHDL, LDLDIRECT in the last 72 hours. Thyroid Function Tests: No results for input(s): TSH, T4TOTAL, FREET4, T3FREE, THYROIDAB in the last 72 hours. Anemia Panel: No results for input(s): VITAMINB12, FOLATE, FERRITIN, TIBC, IRON, RETICCTPCT in the last 72 hours. Urine analysis:    Component Value Date/Time   COLORURINE YELLOW 05/05/2016 2227   APPEARANCEUR HAZY (  A) 05/05/2016 2227   LABSPEC 1.028 05/05/2016 2227   PHURINE 5.0 05/05/2016 2227   GLUCOSEU >=500 (A) 05/05/2016 2227   HGBUR MODERATE (A) 05/05/2016 2227   BILIRUBINUR NEGATIVE 05/05/2016 2227   BILIRUBINUR small 02/10/2014 1127   KETONESUR 5 (A) 05/05/2016 2227   PROTEINUR 100 (A) 05/05/2016 2227   UROBILINOGEN 1.0 02/10/2014 2233   NITRITE NEGATIVE 05/05/2016 2227   LEUKOCYTESUR MODERATE (A) 05/05/2016 2227   Sepsis Labs: $RemoveBefo'@LABRCNTIP'XmcmOKLjwlo$ (procalcitonin:4,lacticidven:4)  ) Recent Results (from the past 240 hour(s))  Blood culture (routine x 2)     Status: None   Collection Time: 05/05/16 10:15 PM  Result Value Ref Range Status   Specimen Description BLOOD LEFT ANTECUBITAL  Final   Special Requests BOTTLES DRAWN AEROBIC AND ANAEROBIC 5 ML EA  Final   Culture   Final    NO GROWTH 5 DAYS Performed at [facility], [place] [address], [place], [place] [address]    Report Status 05/10/2016 FINAL  Final  Blood culture (routine x 2)     Status: None   Collection Time: 05/05/16 10:25 PM  Result Value Ref Range Status   Specimen Description BLOOD L WRIST  Final   Special Requests IN PEDIATRIC BOTTLE 2ML  Final   Culture   Final    NO GROWTH 5 DAYS Performed at [facility], [place] [address] [place], [place] [address]    Report Status 05/10/2016 FINAL  Final  Urine culture     Status: Abnormal   Collection Time: 05/05/16 10:27 PM  Result Value Ref Range Status   Specimen Description URINE, RANDOM  Final   Special Requests NONE  Final   Culture MULTIPLE SPECIES PRESENT, SUGGEST RECOLLECTION (A)  Final   Report Status 05/07/2016 FINAL  Final  Surgical pcr screen     Status: Abnormal   Collection Time: 05/09/16  1:51 AM  Result Value Ref Range Status   MRSA, PCR NEGATIVE NEGATIVE Final   Staphylococcus aureus POSITIVE (A) NEGATIVE Final    Comment:        The Xpert SA Assay (FDA approved for NASAL specimens in patients over 59 years of age), is one component of a comprehensive surveillance program.  Test performance has been validated by [facility] for patients greater than or equal to 19 year old. It is not intended to diagnose infection nor to guide or monitor treatment.       Radiology Studies: No results found.   Scheduled Meds: . Chlorhexidine Gluconate Cloth  6 each Topical Daily  . enoxaparin (LOVENOX) injection  40 mg Subcutaneous QHS  . insulin aspart  0-15 Units Subcutaneous Q4H  . insulin aspart  7 Units Subcutaneous Once  . lip balm  1 application Topical BID  . mupirocin ointment  1 application Nasal BID  . potassium chloride  20 mEq Oral TID   Continuous Infusions: . lactated ringers 100 mL/hr at 05/13/16 1011     LOS: 8 days   Time Spent in minutes   30 minutes  Masaji Billups D.O. on 05/13/2016 at 2:29 PM  Between 7am to 7pm - Pager - [phone]  After 7pm go to [email or web address] - password [email or web address]  And look for the night coverage person covering for me after hours  [facility] Office  [phone]

## 2016-05-14 LAB — GLUCOSE, CAPILLARY
GLUCOSE-CAPILLARY: 119 mg/dL — AB (ref 65–99)
GLUCOSE-CAPILLARY: 116 mg/dL — AB (ref 65–99)
Glucose-Capillary: 103 mg/dL — ABNORMAL HIGH (ref 65–99)
Glucose-Capillary: 124 mg/dL — ABNORMAL HIGH (ref 65–99)
Glucose-Capillary: 125 mg/dL — ABNORMAL HIGH (ref 65–99)
Glucose-Capillary: 147 mg/dL — ABNORMAL HIGH (ref 65–99)

## 2016-05-14 LAB — BASIC METABOLIC PANEL
Anion gap: 6 (ref 5–15)
BUN: 7 mg/dL (ref 6–20)
CO2: 29 mmol/L (ref 22–32)
CREATININE: 0.63 mg/dL (ref 0.44–1.00)
Calcium: 7.5 mg/dL — ABNORMAL LOW (ref 8.9–10.3)
Chloride: 103 mmol/L (ref 101–111)
Glucose, Bld: 122 mg/dL — ABNORMAL HIGH (ref 65–99)
Potassium: 3 mmol/L — ABNORMAL LOW (ref 3.5–5.1)
SODIUM: 138 mmol/L (ref 135–145)

## 2016-05-14 LAB — CBC
HCT: 25.2 % — ABNORMAL LOW (ref 36.0–46.0)
Hemoglobin: 8.6 g/dL — ABNORMAL LOW (ref 12.0–15.0)
MCH: 29.5 pg (ref 26.0–34.0)
MCHC: 34.1 g/dL (ref 30.0–36.0)
MCV: 86.3 fL (ref 78.0–100.0)
Platelets: 246 10*3/uL (ref 150–400)
RBC: 2.92 MIL/uL — AB (ref 3.87–5.11)
RDW: 13.7 % (ref 11.5–15.5)
WBC: 6.1 10*3/uL (ref 4.0–10.5)

## 2016-05-14 LAB — MAGNESIUM: MAGNESIUM: 1.8 mg/dL (ref 1.7–2.4)

## 2016-05-14 MED ORDER — POTASSIUM CHLORIDE CRYS ER 20 MEQ PO TBCR
20.0000 meq | EXTENDED_RELEASE_TABLET | Freq: Three times a day (TID) | ORAL | Status: AC
  Administered 2016-05-14 (×3): 20 meq via ORAL
  Filled 2016-05-14 (×3): qty 1

## 2016-05-14 NOTE — Progress Notes (Signed)
5 Days Post-Op  Subjective: Soup last night too salty; spit up small amount of grits this am; had multiple BMs. Reports more flatus than burping today  Objective: Vital signs in last 24 hours: Temp:  [98.1 F (36.7 C)-98.8 F (37.1 C)] 98.2 F (36.8 C) (03/22 0459) Pulse Rate:  [67-72] 70 (03/22 0459) Resp:  [16-22] 16 (03/22 0459) BP: (136-155)/(61-67) 136/61 (03/22 0459) SpO2:  [98 %-100 %] 98 % (03/22 0459) Weight:  [74.6 kg (164 lb 7.4 oz)] 74.6 kg (164 lb 7.4 oz) (03/22 0459) Last BM Date: 05/14/16  Intake/Output from previous day: 03/21 0701 - 03/22 0700 In: 2265 [I.V.:2265] Out: 1050 [Urine:1050] Intake/Output this shift: No intake/output data recorded.  Alert, sitting on bedside commode cta Soft, some distension, incision ok No edema  Lab Results:   Recent Labs  05/13/16 0455 05/14/16 0443  WBC 6.9 6.1  HGB 8.5* 8.6*  HCT 25.1* 25.2*  PLT 240 246   BMET  Recent Labs  05/13/16 0455 05/14/16 0443  NA 142 138  K 2.9* 3.0*  CL 103 103  CO2 25 29  GLUCOSE 91 122*  BUN 10 7  CREATININE 0.82 0.63  CALCIUM 7.9* 7.5*   PT/INR No results for input(s): LABPROT, INR in the last 72 hours. ABG No results for input(s): PHART, HCO3 in the last 72 hours.  Invalid input(s): PCO2, PO2  Studies/Results: No results found.  Anti-infectives: Anti-infectives    Start     Dose/Rate Route Frequency Ordered Stop   05/09/16 2200  cefoTEtan (CEFOTAN) 2 g in dextrose 5 % 50 mL IVPB     2 g 100 mL/hr over 30 Minutes Intravenous Every 12 hours 05/09/16 1228 05/09/16 2230   05/09/16 0600  cefoTEtan (CEFOTAN) 2 g in dextrose 5 % 50 mL IVPB  Status:  Discontinued     2 g 100 mL/hr over 30 Minutes Intravenous On call to O.R. 05/08/16 2137 05/08/16 2142   05/09/16 0600  cefoTEtan (CEFOTAN) 2 g in dextrose 5 % 50 mL IVPB  Status:  Discontinued     2 g 100 mL/hr over 30 Minutes Intravenous On call to O.R. 05/08/16 2141 05/09/16 1211   05/08/16 2200  cefTRIAXone  (ROCEPHIN) 2 g in dextrose 5 % 50 mL IVPB  Status:  Discontinued    Comments:  Pharmacy may adjust dosing strength / duration / interval for maximal efficacy   2 g 100 mL/hr over 30 Minutes Intravenous Every 24 hours 05/08/16 2119 05/09/16 1228   05/06/16 0600  Ampicillin-Sulbactam (UNASYN) 3 g in sodium chloride 0.9 % 100 mL IVPB  Status:  Discontinued     3 g 200 mL/hr over 30 Minutes Intravenous Every 6 hours 05/06/16 0220 05/08/16 2119   05/05/16 2215  piperacillin-tazobactam (ZOSYN) IVPB 3.375 g     3.375 g 100 mL/hr over 30 Minutes Intravenous  Once 05/05/16 2211 05/05/16 2303      Assessment/Plan: s/p Procedure(s): LAPAROSCOPY DIAGNOSTIC, LYSIS OF ADHESIONS, SMALL BOWEL RESECTION X 2 (N/A) LAPAROSCOPIC SMALL BOWEL RESECTION (N/A) - POD 5 - path: DIVERTICULI WITH ASSOCIATED ULCERATION AND ACUTE INFLAMMATION, NO EVIDENCE OF MALIGNANCY  DM poorly controlled Chronic normocytic anemia UTI  FEN - IVF, full liquids - do not adv diet until less distension; hypokalemia - replace potassium VTE - lovenox  Leighton Ruff. Redmond Pulling, MD, FACS General, Bariatric, & Minimally Invasive Surgery [facility], [place]   LOS: 9 days    Gayland Curry 05/14/2016

## 2016-05-14 NOTE — Progress Notes (Signed)
PROGRESS NOTE    Bailey [name]  BMW:[id number] DOB: 1950/09/24 DOA: 05/05/2016 PCP: [name], MD   Chief Complaint  Patient presents with  . lower abd pain    Brief Narrative:  66 yo female with past medical history of T2dm, presented with abdominal pain, constant and severe. On the initial physical examination, she was found dehydrated, tender abdomen to palpation, CT positive for small bowel perforation. Surgery consulted with recommendations for conservative care. Started on IV antibiotics and IV fluids, NPO. Follow CT with signs of bowel obstruction, large jejunal diverticulosis with no signs of perforation. Patient underwent laparoscopic small bowel resection. AKI due to hypovolemia, resolving.  Assessment & Plan   Small bowel obstruction -General surgery consulted and appreciated, s/p laparoscopic small bowel resection -Placed on full liquids today per surgery -Continue pain control -Spoke with Dr. Redmond Pulling, patient will advance diet slowly -Patient had some nausea and vomiting this morning.  Diabetes mellitus, type II -Continue ISS and CBG monitoring  Normocytic Anemia multifactorial -Baseline hemoglobin 9-10 -hemoglobin currently 8.6 (dropped to 7.8) -Currently has no signs of active bleeding -Possibly dilutional -Continue to monitor CBC  Urine infection  -Resolved. Completed antibiotic therapy.   Acute kidney injury -Resolved -Creatinine peaked at 2.24 -Currently 0.63  Hypokalemia/hypomagnesemia  -continue to replace and monitor BMP and magnesium  DVT Prophylaxis  lovenox  Code Status: Full  Family Communication: None at bedside  Disposition Plan: Admitted. Pending advancement in diet.  Consultants General surgery  Procedures  LAPAROSCOPY DIAGNOSTIC, LYSIS OF ADHESIONS, SMALL BOWEL RESECTION X 2 (N/A) LAPAROSCOPIC SMALL BOWEL RESECTION (N/A)  Antibiotics   Anti-infectives    Start     Dose/Rate Route Frequency Ordered Stop   05/09/16  2200  cefoTEtan (CEFOTAN) 2 g in dextrose 5 % 50 mL IVPB     2 g 100 mL/hr over 30 Minutes Intravenous Every 12 hours 05/09/16 1228 05/09/16 2230   05/09/16 0600  cefoTEtan (CEFOTAN) 2 g in dextrose 5 % 50 mL IVPB  Status:  Discontinued     2 g 100 mL/hr over 30 Minutes Intravenous On call to O.R. 05/08/16 2137 05/08/16 2142   05/09/16 0600  cefoTEtan (CEFOTAN) 2 g in dextrose 5 % 50 mL IVPB  Status:  Discontinued     2 g 100 mL/hr over 30 Minutes Intravenous On call to O.R. 05/08/16 2141 05/09/16 1211   05/08/16 2200  cefTRIAXone (ROCEPHIN) 2 g in dextrose 5 % 50 mL IVPB  Status:  Discontinued    Comments:  Pharmacy may adjust dosing strength / duration / interval for maximal efficacy   2 g 100 mL/hr over 30 Minutes Intravenous Every 24 hours 05/08/16 2119 05/09/16 1228   05/06/16 0600  Ampicillin-Sulbactam (UNASYN) 3 g in sodium chloride 0.9 % 100 mL IVPB  Status:  Discontinued     3 g 200 mL/hr over 30 Minutes Intravenous Every 6 hours 05/06/16 0220 05/08/16 2119   05/05/16 2215  piperacillin-tazobactam (ZOSYN) IVPB 3.375 g     3.375 g 100 mL/hr over 30 Minutes Intravenous  Once 05/05/16 2211 05/05/16 2303      Subjective:   Bailey [name] seen and examined today. States she tried to eat soup overnight, however it was salty.  She tried to eat grits this morning, however, felt it was too heavy and vomited.  Still has some soreness with movement. Is having bowel movements.  Denies chest pain, shortness of breath.    Objective:   Vitals:   05/13/16 1100 05/13/16 1500 05/13/16  2123 05/14/16 0459  BP: (!) 155/65 (!) 153/67 (!) 143/65 136/61  Pulse: 67 72 71 70  Resp: 16 (!) $Remo'22 18 16  'TpDLv$ Temp: 98.2 F (36.8 C) 98.1 F (36.7 C) 98.8 F (37.1 C) 98.2 F (36.8 C)  TempSrc: Oral Oral Oral Oral  SpO2:   100% 98%  Weight:    74.6 kg (164 lb 7.4 oz)  Height:        Intake/Output Summary (Last 24 hours) at 05/14/16 1301 Last data filed at 05/14/16 0912  Gross per 24 hour  Intake              1985 ml  Output              650 ml  Net             1335 ml   Filed Weights   05/09/16 0521 05/12/16 0500 05/14/16 0459  Weight: 71.3 kg (157 lb 3 oz) 72.9 kg (160 lb 11.5 oz) 74.6 kg (164 lb 7.4 oz)    Exam  General: Well developed, well nourished, NAD, appears stated age  91: NCAT, mucous membranes moist.   Cardiovascular: S1 S2 auscultated,no murmurs, RRR  Respiratory: Clear to auscultation bilaterally with equal chest rise  Abdomen: Soft, nontender, mildly distended, + bowel sounds, incisions clean  Extremities: warm dry without cyanosis clubbing or edema  Neuro: AAOx3, nonfocal  Psych: Normal affect and demeanor, pleasant   Data Reviewed: I have personally reviewed following labs and imaging studies  CBC:  Recent Labs Lab 05/08/16 0421 05/09/16 0509 05/10/16 0438 05/11/16 0418 05/12/16 0513 05/13/16 0455 05/14/16 0443  WBC 5.9 7.5 8.7 7.9 6.1 6.9 6.1  NEUTROABS 3.7 5.4 6.4  --  3.7 3.8  --   HGB 9.6* 10.2* 8.4* 7.9* 7.8* 8.5* 8.6*  HCT 28.7* 30.2* 24.7* 23.3* 24.0* 25.1* 25.2*  MCV 86.2 87.0 86.4 86.3 85.1 86.6 86.3  PLT 218 285 225 197 214 240 628   Basic Metabolic Panel:  Recent Labs Lab 05/10/16 0438 05/11/16 0418 05/12/16 0513 05/13/16 0455 05/14/16 0443  NA 143 145 144 142 138  K 3.4* 3.4* 2.7* 2.9* 3.0*  CL 109 110 107 103 103  CO2 18* $Remov'25 27 25 29  'tgXJNf$ GLUCOSE 122* 112* 87 91 122*  BUN 28* 25* $Remov'14 10 7  'BXSOod$ CREATININE 2.24* 1.47* 0.87 0.82 0.63  CALCIUM 7.9* 8.0* 7.8* 7.9* 7.5*  MG 1.7 2.2  --  1.6* 1.8   GFR: Estimated Creatinine Clearance: 67.5 mL/min (by C-G formula based on SCr of 0.63 mg/dL). Liver Function Tests: No results for input(s): AST, ALT, ALKPHOS, BILITOT, PROT, ALBUMIN in the last 168 hours. No results for input(s): LIPASE, AMYLASE in the last 168 hours. No results for input(s): AMMONIA in the last 168 hours. Coagulation Profile: No results for input(s): INR, PROTIME in the last 168 hours. Cardiac Enzymes: No  results for input(s): CKTOTAL, CKMB, CKMBINDEX, TROPONINI in the last 168 hours. BNP (last 3 results) No results for input(s): PROBNP in the last 8760 hours. HbA1C: No results for input(s): HGBA1C in the last 72 hours. CBG:  Recent Labs Lab 05/13/16 1955 05/13/16 2351 05/14/16 0408 05/14/16 0748 05/14/16 1143  GLUCAP 157* 114* 119* 103* 125*   Lipid Profile: No results for input(s): CHOL, HDL, LDLCALC, TRIG, CHOLHDL, LDLDIRECT in the last 72 hours. Thyroid Function Tests: No results for input(s): TSH, T4TOTAL, FREET4, T3FREE, THYROIDAB in the last 72 hours. Anemia Panel: No results for input(s): VITAMINB12, FOLATE, FERRITIN, TIBC, IRON, RETICCTPCT  in the last 72 hours. Urine analysis:    Component Value Date/Time   COLORURINE YELLOW 05/05/2016 2227   APPEARANCEUR HAZY (A) 05/05/2016 2227   LABSPEC 1.028 05/05/2016 2227   PHURINE 5.0 05/05/2016 2227   GLUCOSEU >=500 (A) 05/05/2016 2227   HGBUR MODERATE (A) 05/05/2016 2227   BILIRUBINUR NEGATIVE 05/05/2016 2227   BILIRUBINUR small 02/10/2014 1127   KETONESUR 5 (A) 05/05/2016 2227   PROTEINUR 100 (A) 05/05/2016 2227   UROBILINOGEN 1.0 02/10/2014 2233   NITRITE NEGATIVE 05/05/2016 2227   LEUKOCYTESUR MODERATE (A) 05/05/2016 2227   Sepsis Labs: $RemoveBefo'@LABRCNTIP'QohwzVupzUB$ (procalcitonin:4,lacticidven:4)  ) Recent Results (from the past 240 hour(s))  Blood culture (routine x 2)     Status: None   Collection Time: 05/05/16 10:15 PM  Result Value Ref Range Status   Specimen Description BLOOD LEFT ANTECUBITAL  Final   Special Requests BOTTLES DRAWN AEROBIC AND ANAEROBIC 5 ML EA  Final   Culture   Final    NO GROWTH 5 DAYS Performed at [facility], [place] [address], [place], [place] [address]    Report Status 05/10/2016 FINAL  Final  Blood culture (routine x 2)     Status: None   Collection Time: 05/05/16 10:25 PM  Result Value Ref Range Status   Specimen Description BLOOD L WRIST  Final   Special Requests IN PEDIATRIC BOTTLE 2ML   Final   Culture   Final    NO GROWTH 5 DAYS Performed at [facility], [place] [address] [place],  [address]    Report Status 05/10/2016 FINAL  Final  Urine culture     Status: Abnormal   Collection Time: 05/05/16 10:27 PM  Result Value Ref Range Status   Specimen Description URINE, RANDOM  Final   Special Requests NONE  Final   Culture MULTIPLE SPECIES PRESENT, SUGGEST RECOLLECTION (A)  Final   Report Status 05/07/2016 FINAL  Final  Surgical pcr screen     Status: Abnormal   Collection Time: 05/09/16  1:51 AM  Result Value Ref Range Status   MRSA, PCR NEGATIVE NEGATIVE Final   Staphylococcus aureus POSITIVE (A) NEGATIVE Final    Comment:        The Xpert SA Assay (FDA approved for NASAL specimens in patients over 21 years of age), is one component of a comprehensive surveillance program.  Test performance has been validated by [facility] for patients greater than or equal to 57 year old. It is not intended to diagnose infection nor to guide or monitor treatment.       Radiology Studies: No results found.   Scheduled Meds: . Chlorhexidine Gluconate Cloth  6 each Topical Daily  . enoxaparin (LOVENOX) injection  40 mg Subcutaneous QHS  . insulin aspart  0-15 Units Subcutaneous Q4H  . insulin aspart  7 Units Subcutaneous Once  . lip balm  1 application Topical BID  . mupirocin ointment  1 application Nasal BID  . potassium chloride  20 mEq Oral TID   Continuous Infusions: . lactated ringers 100 mL/hr at 05/14/16 0757     LOS: 9 days   Time Spent in minutes   30 minutes  Taejah Ohalloran D.O. on 05/14/2016 at 1:01 PM  Between 7am to 7pm - Pager - [phone]  After 7pm go to [email or web address] - password [email or web address]  And look for the night coverage person covering for me after hours  [facility] Office  [phone]

## 2016-05-14 NOTE — Progress Notes (Signed)
Initial Nutrition Assessment  INTERVENTION:   Once tolerating full liquids, will provide Ensure Enlive po BID, each supplement provides 350 kcal and 20 grams of protein RD to continue to monitor plan  NUTRITION DIAGNOSIS:   Inadequate oral intake related to nausea, vomiting, poor appetite as evidenced by per patient/family report.  GOAL:   Patient will meet greater than or equal to 90% of their needs  MONITOR:   PO intake, Labs, Weight trends, I & O's  REASON FOR ASSESSMENT:   LOS (x 9 days, inability to advance diet)    ASSESSMENT:   66 yo female with past medical history of T2dm, presented with abdominal pain, constant and severe.  On the initial physical examination, she was found dehydrated, tender abdomen to palpation, CT positive for small bowel perforation. Surgery consulted with recommendations for conservative care. Started on IV antibiotics and IV fluids, NPO. Follow CT with signs of bowel obstruction, large jejunal diverticulosis with no signs of perforation. Patient underwent laparoscopic small bowel resection. AKI due to hypovolemia, resolving 3/17: s/p LAPAROSCOPY DIAGNOSTIC, LYSIS OF ADHESIONS, SMALL BOWEL RESECTION X 2 (N/A) LAPAROSCOPIC SMALL BOWEL RESECTION   Patient reports not feeling well today and has had little intake. Pt states she ate for the first time since admission (9 days) yesterday and had a bowl of cream of chicken soup and orange sherbet. She tolerated these items but did not tolerate tomato soup or some grits this morning. Pt states the soup was "too salty", RD recommended not trying tomato soup d/t the acidity. Pt agreed. Encouraged pt to try some more orange sherbet and puddings today.  Will add nutrition supplements once pt is able to tolerate more full liquids.   Per chart review, pt's weight has increased since admission, +24 lb. Used admission weight for estimated needs. If pt has had weight loss it is masked by fluid weight.  Nutrition  focused physical exam shows no sign of depletion of muscle mass or body fat.  Medications: K-DUR tablet TID, Lactated ringer infusion Labs reviewed: CBGs: 103-125 Low K Mg WNL  Diet Order:  Diet - low sodium heart healthy Diet full liquid Room service appropriate? Yes; Fluid consistency: Thin  Skin:  Reviewed, no issues  Last BM:  3/22  Height:   Ht Readings from Last 1 Encounters:  05/05/16 $RemoveB'5\' 4"'eBbQPNFl$  (1.626 m)    Weight:   Wt Readings from Last 1 Encounters:  05/14/16 164 lb 7.4 oz (74.6 kg)    Ideal Body Weight:  54.5 kg  BMI:  Body mass index is 28.23 kg/m.  Estimated Nutritional Needs:   Kcal:  1600-1800  Protein:  75-85g  Fluid:  1.8L/day  EDUCATION NEEDS:   No education needs identified at this time  Clayton Bibles, MS, RD, LDN Pager: [phone] After Hours Pager: [phone]

## 2016-05-15 DIAGNOSIS — E1169 Type 2 diabetes mellitus with other specified complication: Secondary | ICD-10-CM

## 2016-05-15 DIAGNOSIS — E1165 Type 2 diabetes mellitus with hyperglycemia: Secondary | ICD-10-CM

## 2016-05-15 LAB — GLUCOSE, CAPILLARY
GLUCOSE-CAPILLARY: 108 mg/dL — AB (ref 65–99)
GLUCOSE-CAPILLARY: 99 mg/dL (ref 65–99)
GLUCOSE-CAPILLARY: 106 mg/dL — AB (ref 65–99)
Glucose-Capillary: 129 mg/dL — ABNORMAL HIGH (ref 65–99)
Glucose-Capillary: 146 mg/dL — ABNORMAL HIGH (ref 65–99)
Glucose-Capillary: 148 mg/dL — ABNORMAL HIGH (ref 65–99)

## 2016-05-15 LAB — BASIC METABOLIC PANEL
ANION GAP: 6 (ref 5–15)
BUN: 6 mg/dL (ref 6–20)
CO2: 31 mmol/L (ref 22–32)
Calcium: 7.6 mg/dL — ABNORMAL LOW (ref 8.9–10.3)
Chloride: 102 mmol/L (ref 101–111)
Creatinine, Ser: 0.75 mg/dL (ref 0.44–1.00)
GLUCOSE: 110 mg/dL — AB (ref 65–99)
POTASSIUM: 3 mmol/L — AB (ref 3.5–5.1)
SODIUM: 139 mmol/L (ref 135–145)

## 2016-05-15 LAB — CBC
HEMATOCRIT: 24.5 % — AB (ref 36.0–46.0)
HEMOGLOBIN: 8.2 g/dL — AB (ref 12.0–15.0)
MCH: 29 pg (ref 26.0–34.0)
MCHC: 33.5 g/dL (ref 30.0–36.0)
MCV: 86.6 fL (ref 78.0–100.0)
Platelets: 214 10*3/uL (ref 150–400)
RBC: 2.83 MIL/uL — AB (ref 3.87–5.11)
RDW: 13.7 % (ref 11.5–15.5)
WBC: 4.7 10*3/uL (ref 4.0–10.5)

## 2016-05-15 LAB — MAGNESIUM: Magnesium: 1.8 mg/dL (ref 1.7–2.4)

## 2016-05-15 MED ORDER — POLYETHYLENE GLYCOL 3350 17 G PO PACK: 17.0000 g | PACK | Freq: Every day | ORAL | Status: DC | PRN

## 2016-05-15 MED ORDER — POTASSIUM CHLORIDE CRYS ER 20 MEQ PO TBCR
40.0000 meq | EXTENDED_RELEASE_TABLET | Freq: Three times a day (TID) | ORAL | Status: AC
  Administered 2016-05-15 (×3): 40 meq via ORAL
  Filled 2016-05-15 (×3): qty 2

## 2016-05-15 MED ORDER — POLYETHYLENE GLYCOL 3350 17 G PO PACK: 17.0000 g | PACK | Freq: Every day | ORAL | 0 refills | Status: DC | PRN

## 2016-05-15 MED ORDER — IBUPROFEN 200 MG PO TABS: ORAL_TABLET | ORAL | Status: DC

## 2016-05-15 MED ORDER — INSULIN ASPART 100 UNIT/ML ~~LOC~~ SOLN: 0.0000 [IU] | Freq: Three times a day (TID) | SUBCUTANEOUS | Status: DC

## 2016-05-15 MED ORDER — INSULIN ASPART 100 UNIT/ML ~~LOC~~ SOLN
0.0000 [IU] | Freq: Three times a day (TID) | SUBCUTANEOUS | Status: DC
  Administered 2016-05-15: 2 [IU] via SUBCUTANEOUS

## 2016-05-15 MED ORDER — BISACODYL 10 MG RE SUPP: 10.0000 mg | RECTAL | 0 refills | Status: DC | PRN

## 2016-05-15 MED ORDER — MAGNESIUM SULFATE 2 GM/50ML IV SOLN
2.0000 g | Freq: Once | INTRAVENOUS | Status: AC
  Administered 2016-05-15: 2 g via INTRAVENOUS
  Filled 2016-05-15: qty 50

## 2016-05-15 MED ORDER — ENSURE ENLIVE PO LIQD
237.0000 mL | Freq: Two times a day (BID) | ORAL | Status: DC
  Administered 2016-05-15: 237 mL via ORAL

## 2016-05-15 MED ORDER — DOCUSATE SODIUM 100 MG PO CAPS
100.0000 mg | ORAL_CAPSULE | Freq: Two times a day (BID) | ORAL | Status: DC
  Administered 2016-05-15 (×2): 100 mg via ORAL
  Filled 2016-05-15 (×2): qty 1

## 2016-05-15 MED ORDER — IBUPROFEN 200 MG PO TABS: 600.0000 mg | ORAL_TABLET | Freq: Four times a day (QID) | ORAL | Status: DC | PRN

## 2016-05-15 MED ORDER — OXYCODONE HCL 5 MG PO TABS: 5.0000 mg | ORAL_TABLET | ORAL | 0 refills | Status: DC | PRN

## 2016-05-15 NOTE — Progress Notes (Signed)
I have personally reviewed the patients medication history on the [place] controlled substance database. Plan to send her home on PRN Ibuprofen, tylenol and last resort oxycodone.  # 15.  She has taken almost nothing here in the hospital.

## 2016-05-15 NOTE — Discharge Summary (Signed)
Physician Discharge Summary  Bailey [name] XHB:[id number] DOB: [birth year] DOA: 05/05/2016  PCP: [name], MD  Admit date: 05/05/2016 Discharge date: 05/16/2016  Admitted From: home Disposition:  home   Recommendations for Outpatient Follow-up:  1. Bmet and CBC in 1 wk by PCP 2.  address poor control of blood sugars and non-compliance with medications- I have not added medications as her diet is currently limited and sugars are controlled  Discharge Condition:  stable   CODE STATUS:  Full code   Diet recommendation:  Carb modified, heart healthy Consultations:  gen surgery  Procedures/Studies: 05/09/16 LAPAROSCOPY DIAGNOSTIC LAPAROSCOPIC LYSIS OF AHDESIONS LAPAROSCOPIC SMALL BOWEL RESECTION x 2 Left lower extremity venous duplex complete. There is no evidence of deep or superficial vein thrombosis involving the left lower extremity. All visualized vessels appear patent and compressible. There is no evidence of a Baker's cyst on the left.   Discharge Diagnoses:  Principal Problem:   Diverticular disease of jejunum s/p resection 05/09/2016 Active Problems:   SBO (small bowel obstruction)   Acute renal failure (ARF) (HCC)   Type II diabetes mellitus, uncontrolled (HCC)   Normocytic anemia   UTI (urinary tract infection)   Dehydration    Subjective: No complaints. Tolerating solid food. No constipation.  We have had a long discussion about her uncontrolled DM including complications of uncontrolled sugars and the need to get back on track with her medications and her PCP  Brief Summary: 66 yo female with past medical history of T2DM not taking her medications for 6 mo presented with abdominal pain, constant and severe. CT positive for walled off small bowel perforation. Surgery consulted with recommendations for conservative care. Started on IV antibiotics and IV fluids, NPO. Repeat CT scan on 3/16 more suggestive of intraluminal intestinal mass causing bowel obstruction.  Patient underwent laparoscopic small bowel resection.     Hospital Course:  Small bowel obstruction -General surgery consulted- s/p laparoscopic small bowel resection on 3/17  - post op diagnosis was : Proximal jejunal diverticulosis with mesenteric mass (Probable jejunal diverticulitis versus tumor) causing internal hernia & SBO - path: DIVERTICULI WITH ASSOCIATED ULCERATION AND ACUTE INFLAMMATION, NO EVIDENCE OF MALIGNANCY - has been slowly progressed to a soft diet - post op instructions and f/u mentioned below    Diabetes mellitus, type II - A1c 10.2 on 05/15/16 - states that she has not been taking Insulin or Invokamet (afraid to take this because of commercials on TV) that her doctor prescribed her  - sugars well controlled in hospital as PO intake was in adequate due to above - have asked her to see her PCP next 1-2 weeks and discuss further plans for treatment based on the amount of oral intake she is having post op (she will be on a light diet per GI)  Normocytic Anemia   -Baseline hemoglobin 9-10 -hemoglobin currently around 8   UTI - culture 3/13 showed multiple species- she was on multiple different antibiotics including Unasyn, Rocephin & Cefotetan due to GI issues and did receive more than 3 continuous days of antibiotics  Acute kidney injury -Resolved -Creatinine peaked at 2.24 -Currently 0.75  Hypokalemia/hypomagnesemia  -continue to replace    Left leg edema - venous duplex negative - given TEDS to take home- asked to elevate until edema resolves   Discharge Instructions  Discharge Instructions    Call MD for:    Complete by:  As directed    FEVER > 101.5 F  (temperatures < 101.5 F are not significant)  Call MD for:  extreme fatigue    Complete by:  As directed    Call MD for:  persistant dizziness or light-headedness    Complete by:  As directed    Call MD for:  persistant nausea and vomiting    Complete by:  As directed    Call MD for:   redness, tenderness, or signs of infection (pain, swelling, redness, odor or green/yellow discharge around incision site)    Complete by:  As directed    Call MD for:  severe uncontrolled pain    Complete by:  As directed    Diet - low sodium heart healthy    Complete by:  As directed    Follow a light diet the first few days at home.   Start with a bland diet such as soups, liquids, starchy foods, low fat foods, etc.   If you feel full, bloated, or constipated, stay on a full liquid or pureed/blenderized diet for a few days until you feel better and no longer constipated. Be sure to drink plenty of fluids every day to avoid getting dehydrated (feeling dizzy, not urinating, etc.). Gradually add a fiber supplement to your diet   Diet - low sodium heart healthy    Complete by:  As directed    Diet Carb Modified    Complete by:  As directed    Discharge instructions    Complete by:  As directed    See Discharge Instructions If you are not getting better after two weeks or are noticing you are getting worse, contact our office [phone] for further advice.  We may need to adjust your medications, re-evaluate you in the office, send you to the emergency room, or see what other things we can do to help. The clinic staff is available to answer your questions during regular business hours (8:30am-5pm).  Please don't hesitate to call and ask to speak to one of our nurses for clinical concerns.    A surgeon from [facility] is always on call at the hospitals 24 hours/day If you have a medical emergency, go to the nearest emergency room or call 911.   Driving Restrictions    Complete by:  As directed    You may drive when you are no longer taking narcotic prescription pain medication, you can comfortably wear a seatbelt, and you can safely make sudden turns/stops to protect yourself without hesitating due to pain.   Increase activity slowly    Complete by:  As directed    Start  light daily activities --- self-care, walking, climbing stairs- beginning the day after surgery.  Gradually increase activities as tolerated.  Control your pain to be active.  Stop when you are tired.  Ideally, walk several times a day, eventually an hour a day.   Most people are back to most day-to-day activities in a few weeks.  It takes 4-8 weeks to get back to unrestricted, intense activity. If you can walk 30 minutes without difficulty, it is safe to try more intense activity such as jogging, treadmill, bicycling, low-impact aerobics, swimming, etc. Save the most intensive and strenuous activity for last (Usually 4-8 weeks after surgery) such as sit-ups, heavy lifting, contact sports, etc.  Refrain from any intense heavy lifting or straining until you are off narcotics for pain control.  You will have off days, but things should improve week-by-week. DO NOT PUSH THROUGH PAIN.  Let pain be your guide: If it hurts to do something,  don't do it.  Pain is your body warning you to avoid that activity for another week until the pain goes down.   Lifting restrictions    Complete by:  As directed    If you can walk 30 minutes without difficulty, it is safe to try more intense activity such as jogging, treadmill, bicycling, low-impact aerobics, swimming, etc. Save the most intensive and strenuous activity for last (Usually 4-8 weeks after surgery) such as sit-ups, heavy lifting, contact sports, etc.  Refrain from any intense heavy lifting or straining until you are off narcotics for pain control.  You will have off days, but things should improve week-by-week. DO NOT PUSH THROUGH PAIN.  Let pain be your guide: If it hurts to do something, don't do it.  Pain is your body warning you to avoid that activity for another week until the pain goes down.   May walk up steps    Complete by:  As directed    No wound care    Complete by:  As directed    It is good for closed incision and even open wounds to be washed  every day.  Shower every day.  Short baths are fine.  Wash the incisions and wounds clean with soap & water.    If you have a closed incision(s), wash the incision with soap & water every day.  You may leave closed incisions open to air if it is dry.   You may cover the incision with clean gauze & replace it after your daily shower for comfort. If you have skin tapes (Steristrips) or skin glue (Dermabond) on your incision, leave them in place.  They will fall off on their own like a scab.  You may trim any edges that curl up with clean scissors.  If you have staples, set up an appointment for them to be removed in the office in 10 days after surgery.  If you have a drain, wash around the skin exit site with soap & water and place a new dressing of gauze or band aid around the skin every day.  Keep the drain site clean & dry.   Sexual Activity Restrictions    Complete by:  As directed    You may have sexual intercourse when it is comfortable. If it hurts to do something, stop.     Allergies as of 05/16/2016   No Known Allergies     Medication List    TAKE these medications   acetaminophen 500 MG tablet Commonly known as:  TYLENOL Take 500 mg by mouth every 6 (six) hours as needed for moderate pain (pain).   bisacodyl 10 MG suppository Commonly known as:  DULCOLAX Place 1 suppository (10 mg total) rectally as needed for moderate constipation (use if you have not had a BM in 2 days with using the Miralax.).   ibuprofen 200 MG tablet Commonly known as:  ADVIL,MOTRIN You can take 2-3 tablets every 6 hours as needed for pain.  I would take this or the plain Tylenol for pain first.  Use prescribed narcotic as second line pain medication. What changed:  how much to take  how to take this  when to take this  reasons to take this  additional instructions   ICY HOT EX Apply 1 application topically daily as needed (pain).   oxyCODONE 5 MG immediate release tablet Commonly known as:  Oxy  IR/ROXICODONE Take 1 tablet (5 mg total) by mouth every 4 (four) hours as  needed for moderate pain or severe pain.   polyethylene glycol packet Commonly known as:  MIRALAX Take 17 g by mouth daily as needed for mild constipation. Can take up to 2-3 x day if needed      Follow-up Information    GROSS,STEVEN C., MD Follow up.   Specialty:  General Surgery Why:  Our office with call you with follow up appointment.  If you don't hear by Monday call and ask. Be at the office 30 minutes early for check in.  Bring insurance photo ID with you. Contact information: [address] [place] [address] [phone]        DEWEY,ELIZABETH, MD Follow up in 1 week(s).   Specialty:  Family Medicine Contact information: [address] [place] [address] [phone]          No Known Allergies    Imaging:  Ct Abdomen Pelvis W Contrast  Result Date: 05/08/2016 CLINICAL DATA:  Contained small bowel perforation seen on previous CT. EXAM: CT ABDOMEN AND PELVIS WITH CONTRAST TECHNIQUE: Multidetector CT imaging of the abdomen and pelvis was performed using the standard protocol following bolus administration of intravenous contrast. CONTRAST:  ISOVUE-300 IOPAMIDOL (ISOVUE-300) INJECTION 61% COMPARISON:  05/05/2016 FINDINGS: Lower chest:  Subsegmental atelectasis noted lung bases. Hepatobiliary: No focal abnormality within the liver parenchyma. Gallbladder is markedly distended. No intrahepatic or extrahepatic biliary dilation. Pancreas: Pancreas diffusely atrophic. Spleen: No splenomegaly. No focal mass lesion. Adrenals/Urinary Tract: No adrenal nodule or mass. Kidneys unremarkable. No evidence for hydroureter. Bladder is decompressed. Gas in the bladder lumen presumably secondary to recent instrumentation. Stomach/Bowel: Stomach is moderately distended. Large duodenal diverticulum evident. In the interval since prior study, small-bowel loops have become diffusely fluid  filled and dilated, measuring up to 3.6 cm diameter. On today's exam, multiple large diverticuli of the small bowel are seen in the proximal small bowel loops. The area that appeared to represent contained abscess on the previous study is no longer present. However, the area on the prior study felt to represent intussusception is now seen just to the right and caudal of the umbilicus and represents a primary small bowel lesion. This lesion is the site of small bowel obstruction with loops distal to this location being decompressed. The terminal ileum is normal. The appendix is normal. The colon shows diffuse diverticular change and is collapsed along its entire length. Vascular/Lymphatic: No abdominal aortic aneurysm. The portal vein and superior mesenteric vein are patent. Splenic vein is patent. There is no gastrohepatic or hepatoduodenal ligament lymphadenopathy. No intraperitoneal or retroperitoneal lymphadenopathy. No pelvic sidewall lymphadenopathy. Reproductive: The uterus has normal CT imaging appearance. There is no adnexal mass. Other: Free intraperitoneal fluid is identified around the liver, spleen, in the para colic gutters, and in the central pelvis. Since the prior study, the patient has developed fairly prominent interloop mesenteric fluid associated with the dilated small bowel loops. Musculoskeletal: Bone windows reveal no worrisome lytic or sclerotic osseous lesions. IMPRESSION: 1. In the interval since the prior study, the patient has developed a small bowel obstruction secondary to an intrinsic small-bowel lesion located in the mid small bowel, near the jejunal ileal junction probably in the proximal to mid ileum. This lesion is visible on image 64 of series 2 today, represents circumferential wall thickening along a length of about 3 cm and is just deep to the rectus fascia to the right and caudal of the umbilicus. Small bowel distal to this lesion is decompressed  as is the entire colon. 2.  Patient is noted to have multiple relatively large jejunal diverticuli. As there is no residual of the apparent contained perforation seen on the prior study, this appearance on the prior study was probably related to a large debris filled small bowel diverticulum. 3. Interval development of small to moderate volume interloop mesenteric and free intraperitoneal fluid. 4. Diffuse colonic diverticulosis. I personally discussed these findings by telephone with Dr. Cathlean Sauer at approximately 10:50 a.m. on 05/08/2016. Electronically Signed   By: Misty Stanley M.D.   On: 05/08/2016 10:54   Ct Abdomen Pelvis W Contrast  Result Date: 05/05/2016 CLINICAL DATA:  Lower abdominal pain EXAM: CT ABDOMEN AND PELVIS WITH CONTRAST TECHNIQUE: Multidetector CT imaging of the abdomen and pelvis was performed using the standard protocol following bolus administration of intravenous contrast. CONTRAST:  182mL ISOVUE-300 IOPAMIDOL (ISOVUE-300) INJECTION 61% COMPARISON:  None. FINDINGS: Lower chest: No acute abnormality. Hepatobiliary: No focal liver abnormality is seen. No gallstones, gallbladder wall thickening, or biliary dilatation. Pancreas: Unremarkable. No pancreatic ductal dilatation or surrounding inflammatory changes. Spleen: Normal in size without focal abnormality. Adrenals/Urinary Tract: Adrenal glands are unremarkable. Kidneys are normal, without renal calculi, focal lesion, or hydronephrosis. Bladder is well distended although some air is noted in the nondependent portion of the bladder. This may be related to recent instrumentation. Clinical correlation is recommended. Stomach/Bowel: Diverticular change of the colon is noted without evidence of diverticulitis. No obstructive nor inflammatory changes are seen. The appendix is well visualized and within normal limits. No inflammatory changes are noted. An area of ring enhancement is identified in the left mid abdomen within a loop of small bowel suggestive of intermittent  intussusception. In the mid abdomen adjacent to a loop of small bowel there is a somewhat irregular 5.3 by 3.3 cm air-fluid collection consistent with focal perforation and abscess formation. This lies in close proximity to the area of intussusception. No other focal abnormality is seen. Vascular/Lymphatic: Aortic atherosclerosis. No enlarged abdominal or pelvic lymph nodes. Reproductive: The uterus is within normal limits. Other: Minimal free pelvic fluid is noted. Musculoskeletal: No acute or significant osseous findings. IMPRESSION: Air-fluid collection adjacent to loop of small bowel consistent with an area of contained perforation. This lies in the mid abdomen and is best seen on image number 58 of series 2. In a small bowel loop adjacent to this there are changes consistent within intermittent intussusception. No obstructive changes are noted. Diverticular change without diverticulitis. Air within the bladder although likely related to instrumentation. Clinical correlation is recommended. Electronically Signed   By: Inez Catalina M.D.   On: 05/05/2016 21:36       Discharge Exam: Vitals:   05/15/16 2130 05/16/16 0515  BP: (!) 152/75 (!) 125/56  Pulse: 82 73  Resp: 18 18  Temp: 98.3 F (36.8 C) 98.6 F (37 C)   Vitals:   05/14/16 2110 05/15/16 0436 05/15/16 2130 05/16/16 0515  BP: (!) 146/74 (!) 147/74 (!) 152/75 (!) 125/56  Pulse: 78 76 82 73  Resp: $Remo'18 18 18 18  'hjdoG$ Temp: 98.2 F (36.8 C) 98.9 F (37.2 C) 98.3 F (36.8 C) 98.6 F (37 C)  TempSrc: Oral Oral Oral Oral  SpO2: 98% 98% 98% 99%  Weight:  76.5 kg (168 lb 10.4 oz)    Height:        General: Pt is alert, awake, not in acute distress Cardiovascular: RRR, S1/S2 +, no rubs, no gallops Respiratory: CTA bilaterally, no wheezing, no rhonchi Abdominal: Soft,  NT, ND, bowel sounds + Extremities: + edema L > R leg , no cyanosis    The results of significant diagnostics from this hospitalization (including imaging, microbiology,  ancillary and laboratory) are listed below for reference.     Microbiology: Recent Results (from the past 240 hour(s))  Surgical pcr screen     Status: Abnormal   Collection Time: 05/09/16  1:51 AM  Result Value Ref Range Status   MRSA, PCR NEGATIVE NEGATIVE Final   Staphylococcus aureus POSITIVE (A) NEGATIVE Final    Comment:        The Xpert SA Assay (FDA approved for NASAL specimens in patients over 44 years of age), is one component of a comprehensive surveillance program.  Test performance has been validated by [facility] for patients greater than or equal to 62 year old. It is not intended to diagnose infection nor to guide or monitor treatment.      Labs: BNP (last 3 results) No results for input(s): BNP in the last 8760 hours. Basic Metabolic Panel:  Recent Labs Lab 05/10/16 0438 05/11/16 0418 05/12/16 0513 05/13/16 0455 05/14/16 0443 05/15/16 0411  NA 143 145 144 142 138 139  K 3.4* 3.4* 2.7* 2.9* 3.0* 3.0*  CL 109 110 107 103 103 102  CO2 18* $Remov'25 27 25 29 31  'JtZDCp$ GLUCOSE 122* 112* 87 91 122* 110*  BUN 28* 25* $Remov'14 10 7 6  'fAyeLF$ CREATININE 2.24* 1.47* 0.87 0.82 0.63 0.75  CALCIUM 7.9* 8.0* 7.8* 7.9* 7.5* 7.6*  MG 1.7 2.2  --  1.6* 1.8 1.8   Liver Function Tests: No results for input(s): AST, ALT, ALKPHOS, BILITOT, PROT, ALBUMIN in the last 168 hours. No results for input(s): LIPASE, AMYLASE in the last 168 hours. No results for input(s): AMMONIA in the last 168 hours. CBC:  Recent Labs Lab 05/10/16 0438 05/11/16 0418 05/12/16 0513 05/13/16 0455 05/14/16 0443 05/15/16 0411  WBC 8.7 7.9 6.1 6.9 6.1 4.7  NEUTROABS 6.4  --  3.7 3.8  --   --   HGB 8.4* 7.9* 7.8* 8.5* 8.6* 8.2*  HCT 24.7* 23.3* 24.0* 25.1* 25.2* 24.5*  MCV 86.4 86.3 85.1 86.6 86.3 86.6  PLT 225 197 214 240 246 214   Cardiac Enzymes: No results for input(s): CKTOTAL, CKMB, CKMBINDEX, TROPONINI in the last 168 hours. BNP: Invalid input(s): POCBNP CBG:  Recent Labs Lab 05/15/16 1139  05/15/16 1635 05/15/16 1946 05/15/16 2218 05/16/16 0747  GLUCAP 129* 106* 146* 148* 110*   D-Dimer No results for input(s): DDIMER in the last 72 hours. Hgb A1c No results for input(s): HGBA1C in the last 72 hours. Lipid Profile No results for input(s): CHOL, HDL, LDLCALC, TRIG, CHOLHDL, LDLDIRECT in the last 72 hours. Thyroid function studies No results for input(s): TSH, T4TOTAL, T3FREE, THYROIDAB in the last 72 hours.  Invalid input(s): FREET3 Anemia work up No results for input(s): VITAMINB12, FOLATE, FERRITIN, TIBC, IRON, RETICCTPCT in the last 72 hours. Urinalysis    Component Value Date/Time   COLORURINE YELLOW 05/05/2016 2227   APPEARANCEUR HAZY (A) 05/05/2016 2227   LABSPEC 1.028 05/05/2016 2227   PHURINE 5.0 05/05/2016 2227   GLUCOSEU >=500 (A) 05/05/2016 2227   HGBUR MODERATE (A) 05/05/2016 2227   BILIRUBINUR NEGATIVE 05/05/2016 2227   BILIRUBINUR small 02/10/2014 1127   KETONESUR 5 (A) 05/05/2016 2227   PROTEINUR 100 (A) 05/05/2016 2227   UROBILINOGEN 1.0 02/10/2014 2233   NITRITE NEGATIVE 05/05/2016 2227   LEUKOCYTESUR MODERATE (A) 05/05/2016 2227   Sepsis Labs  Invalid input(s): PROCALCITONIN,  WBC,  LACTICIDVEN Microbiology Recent Results (from the past 240 hour(s))  Surgical pcr screen     Status: Abnormal   Collection Time: 05/09/16  1:51 AM  Result Value Ref Range Status   MRSA, PCR NEGATIVE NEGATIVE Final   Staphylococcus aureus POSITIVE (A) NEGATIVE Final    Comment:        The Xpert SA Assay (FDA approved for NASAL specimens in patients over 56 years of age), is one component of a comprehensive surveillance program.  Test performance has been validated by [facility] for patients greater than or equal to 24 year old. It is not intended to diagnose infection nor to guide or monitor treatment.      Time coordinating discharge: Over 30 minutes  SIGNED:   Debbe Odea, MD  [facility] 05/16/2016, 10:08 AM Pager   If 7PM-7AM,  please contact night-coverage [email or web address] Password [email or web address]

## 2016-05-15 NOTE — Discharge Instructions (Signed)
Continue to check your sugars before breakfast and before dinner and take this information to your PCP.  Please take all your medications with you for your next visit with your Primary MD. Please request your Primary MD to go over all hospital test results at the follow up. Please ask your Primary MD to get all Hospital records sent to his/her office.  If you experience worsening of your admission symptoms, develop shortness of breath, chest pain, suicidal or homicidal thoughts or a life threatening emergency, you must seek medical attention immediately by calling 911 or calling your MD.  Bailey [name] must read the complete instructions/literature along with all the possible adverse reactions/side effects for all the medicines you take including new medications that have been prescribed to you. Take new medicines after you have completely understood and accpet all the possible adverse reactions/side effects.   Do not drive when taking pain medications or sedatives.    Do not take more than prescribed Pain, Sleep and Anxiety Medications  If you have smoked or chewed Tobacco in the last 2 yrs please stop. Stop any regular alcohol and or recreational drug use.  Wear Seat belts while driving.       SURGERY: POST OP INSTRUCTIONS (Surgery for small bowel obstruction, colon resection, etc)   ######################################################################  EAT Gradually transition to a high fiber diet with a fiber supplement over the next few days after discharge  WALK Walk an hour a day.  Control your pain to do that.    CONTROL PAIN Control pain so that you can walk, sleep, tolerate sneezing/coughing, go up/down stairs.  HAVE A BOWEL MOVEMENT DAILY Keep your bowels regular to avoid problems.  OK to try a laxative to override constipation.  OK to use an antidairrheal to slow down diarrhea.  Call if not better after 2 tries  CALL IF YOU HAVE PROBLEMS/CONCERNS Call if you are  still struggling despite following these instructions. Call if you have concerns not answered by these instructions  ######################################################################   DIET Follow a light diet the first few days at home.  Start with a bland diet such as soups, liquids, starchy foods, low fat foods, etc.  If you feel full, bloated, or constipated, stay on a ful liquid or pureed/blenderized diet for a few days until you feel better and no longer constipated. Be sure to drink plenty of fluids every day to avoid getting dehydrated (feeling dizzy, not urinating, etc.). Gradually add a fiber supplement to your diet over the next week.  Gradually get back to a regular solid diet.  Avoid fast food or heavy meals the first week as you are more likely to get nauseated. It is expected for your digestive tract to need a few months to get back to normal.  It is common for your bowel movements and stools to be irregular.  You will have occasional bloating and cramping that should eventually fade away.  Until you are eating solid food normally, off all pain medications, and back to regular activities; your bowels will not be normal. Focus on eating a low-fat, high fiber diet the rest of your life (See Getting to [place], below).  CARE of your INCISION or WOUND It is good for closed incision and even open wounds to be washed every day.  Shower every day.  Short baths are fine.  Wash the incisions and wounds clean with soap & water.    If you have a closed incision(s), wash the incision with soap &  water every day.  You may leave closed incisions open to air if it is dry.   You may cover the incision with clean gauze & replace it after your daily shower for comfort. If you have skin tapes (Steristrips) or skin glue (Dermabond) on your incision, leave them in place.  They will fall off on their own like a scab.  You may trim any edges that curl up with clean scissors.  If you have  staples, set up an appointment for them to be removed in the office in 10 days after surgery.  If you have a drain, wash around the skin exit site with soap & water and place a new dressing of gauze or band aid around the skin every day.  Keep the drain site clean & dry.    If you have an open wound with packing, see wound care instructions.  In general, it is encouraged that you remove your dressing and packing, shower with soap & water, and replace your dressing once a day.  Pack the wound with clean gauze moistened with normal (0.9%) saline to keep the wound moist & uninfected.  Pressure on the dressing for 30 minutes will stop most wound bleeding.  Eventually your body will heal & pull the open wound closed over the next few months.  Raw open wounds will occasionally bleed or secrete yellow drainage until it heals closed.  Drain sites will drain a little until the drain is removed.  Even closed incisions can have mild bleeding or drainage the first few days until the skin edges scab over & seal.   If you have an open wound with a wound vac, see wound vac care instructions.     ACTIVITIES as tolerated Start light daily activities --- self-care, walking, climbing stairs-- beginning the day after surgery.  Gradually increase activities as tolerated.  Control your pain to be active.  Stop when you are tired.  Ideally, walk several times a day, eventually an hour a day.   Most people are back to most day-to-day activities in a few weeks.  It takes 4-8 weeks to get back to unrestricted, intense activity. If you can walk 30 minutes without difficulty, it is safe to try more intense activity such as jogging, treadmill, bicycling, low-impact aerobics, swimming, etc. Save the most intensive and strenuous activity for last (Usually 4-8 weeks after surgery) such as sit-ups, heavy lifting, contact sports, etc.  Refrain from any intense heavy lifting or straining until you are off narcotics for pain control.  You  will have off days, but things should improve week-by-week. DO NOT PUSH THROUGH PAIN.  Let pain be your guide: If it hurts to do something, don't do it.  Pain is your body warning you to avoid that activity for another week until the pain goes down. You may drive when you are no longer taking narcotic prescription pain medication, you can comfortably wear a seatbelt, and you can safely make sudden turns/stops to protect yourself without hesitating due to pain. You may have sexual intercourse when it is comfortable. If it hurts to do something, stop.  MEDICATIONS Take your usually prescribed home medications unless otherwise directed.   Blood thinners:  Usually you can restart any strong blood thinners after the second postoperative day.  It is OK to take aspirin right away.     If you are on strong blood thinners (warfarin/Coumadin, Plavix, Xerelto, Eliquis, Pradaxa, etc), discuss with your surgeon, medicine PCP, and/or cardiologist for instructions  on when to restart the blood thinner & if blood monitoring is needed (PT/INR blood check, etc).     PAIN CONTROL Pain after surgery or related to activity is often due to strain/injury to muscle, tendon, nerves and/or incisions.  This pain is usually short-term and will improve in a few months.  To help speed the process of healing and to get back to regular activity more quickly, DO THE FOLLOWING THINGS TOGETHER: 1. Increase activity gradually.  DO NOT PUSH THROUGH PAIN 2. Use Ice and/or Heat 3. Try Gentle Massage and/or Stretching 4. Take over the counter pain medication 5. Take Narcotic prescription pain medication for more severe pain  Good pain control = faster recovery.  It is better to take more medicine to be more active than to stay in bed all day to avoid medications. 1.  Increase activity gradually Avoid heavy lifting at first, then increase to lifting as tolerated over the next 6 weeks. Do not push through the pain.  Listen to your  body and avoid positions and maneuvers than reproduce the pain.  Wait a few days before trying something more intense Walking an hour a day is encouraged to help your body recover faster and more safely.  Start slowly and stop when getting sore.  If you can walk 30 minutes without stopping or pain, you can try more intense activity (running, jogging, aerobics, cycling, swimming, treadmill, sex, sports, weightlifting, etc.) Remember: If it hurts to do it, then dont do it! 2. Use Ice and/or Heat You will have swelling and bruising around the incisions.  This will take several weeks to resolve. Ice packs or heating pads (6-8 times a day, 30-60 minutes at a time) will help sooth soreness & bruising. Some people prefer to use ice alone, heat alone, or alternate between ice & heat.  Experiment and see what works best for you.  Consider trying ice for the first few days to help decrease swelling and bruising; then, switch to heat to help relax sore spots and speed recovery. Shower every day.  Short baths are fine.  It feels good!  Keep the incisions and wounds clean with soap & water.   3. Try Gentle Massage and/or Stretching Massage at the area of pain many times a day Stop if you feel pain - do not overdo it 4. Take over the counter pain medication This helps the muscle and nerve tissues become less irritable and calm down faster Choose ONE of the following over-the-counter anti-inflammatory medications: Acetaminophen 500mg  tabs (Tylenol) 1-2 pills with every meal and just before bedtime (avoid if you have liver problems or if you have acetaminophen in you narcotic prescription) Naproxen 220mg  tabs (ex. Aleve, Naprosyn) 1-2 pills twice a day (avoid if you have kidney, stomach, IBD, or bleeding problems) Ibuprofen 200mg  tabs (ex. Advil, Motrin) 3-4 pills with every meal and just before bedtime (avoid if you have kidney, stomach, IBD, or bleeding problems) Take with food/snack several times a day as  directed for at least 2 weeks to help keep pain / soreness down & more manageable. 5. Take Narcotic prescription pain medication for more severe pain A prescription for strong pain control is often given to you upon discharge (for example: oxycodone/Percocet, hydrocodone/Norco/Vicodin, or tramadol/Ultram) Take your pain medication as prescribed. Be mindful that most narcotic prescriptions contain Tylenol (acetaminophen) as well - avoid taking too much Tylenol. If you are having problems/concerns with the prescription medicine (does not control pain, nausea, vomiting, rash, itching, etc.), please  call us [phone] to see if we need to switch you to a different pain medicine that will work better for you and/or control your side effects better. If you need a refill on your pain medication, you must call the office before 4 pm and on weekdays only.  By federal law, prescriptions for narcotics cannot be called into a pharmacy.  They must be filled out on paper & picked up from our office by the patient or authorized caretaker.  Prescriptions cannot be filled after 4 pm nor on weekends.    WHEN TO CALL us [phone] Severe uncontrolled or worsening pain  Fever over 101 F (38.5 C) Concerns with the incision: Worsening pain, redness, rash/hives, swelling, bleeding, or drainage Reactions / problems with new medications (itching, rash, hives, nausea, etc.) Nausea and/or vomiting Difficulty urinating Difficulty breathing Worsening fatigue, dizziness, lightheadedness, blurred vision Other concerns If you are not getting better after two weeks or are noticing you are getting worse, contact our office [phone] for further advice.  We may need to adjust your medications, re-evaluate you in the office, send you to the emergency room, or see what other things we can do to help. The clinic staff is available to answer your questions during regular business hours (8:30am-5pm).  Please dont hesitate  to call and ask to speak to one of our nurses for clinical concerns.    A surgeon from [facility] is always on call at the hospitals 24 hours/day If you have a medical emergency, go to the nearest emergency room or call 911.  FOLLOW UP in our office One the day of your discharge from the hospital (or the next business weekday), please call [facility] to set up or confirm an appointment to see your surgeon in the office for a follow-up appointment.  Usually it is 2-3 weeks after your surgery.   If you have skin staples at your incision(s), let the office know so we can set up a time in the office for the nurse to remove them (usually around 10 days after surgery). Make sure that you call for appointments the day of discharge (or the next business weekday) from the hospital to ensure a convenient appointment time. IF YOU HAVE DISABILITY OR FAMILY LEAVE FORMS, BRING THEM TO THE OFFICE FOR PROCESSING.  DO NOT GIVE THEM TO YOUR DOCTOR.  [facility] [address] [place], [place], [place]  [address] ? [phone] - Main [phone] - [place],  [phone] - Fax [email or web address]  GETTING TO GOOD BOWEL HEALTH. It is expected for your digestive tract to need a few months to get back to normal.  It is common for your bowel movements and stools to be irregular.  You will have occasional bloating and cramping that should eventually fade away.  Until you are eating solid food normally, off all pain medications, and back to regular activities; your bowels will not be normal.   Avoiding constipation The goal: ONE SOFT BOWEL MOVEMENT A DAY!    Drink plenty of fluids.  Choose water first. TAKE A FIBER SUPPLEMENT EVERY DAY THE REST OF YOUR LIFE During your first week back home, gradually add back a fiber supplement every day Experiment which form you can tolerate.   There are many forms such as powders, tablets, wafers, gummies,  etc Psyllium bran (Metamucil), methylcellulose (Citrucel), Miralax or Glycolax, Benefiber, Flax Seed.  Adjust the dose week-by-week (1/2 dose/day to 6 doses a  day) until you are moving your bowels 1-2 times a day.  Cut back the dose or try a different fiber product if it is giving you problems such as diarrhea or bloating. Sometimes a laxative is needed to help jump-start bowels if constipated until the fiber supplement can help regulate your bowels.  If you are tolerating eating & you are farting, it is okay to try a gentle laxative such as double dose MiraLax, prune juice, or Milk of Magnesia.  Avoid using laxatives too often. Stool softeners can sometimes help counteract the constipating effects of narcotic pain medicines.  It can also cause diarrhea, so avoid using for too long. If you are still constipated despite taking fiber daily, eating solids, and a few doses of laxatives, call our office. Controlling diarrhea Try drinking liquids and eating bland foods for a few days to avoid stressing your intestines further. Avoid dairy products (especially milk & ice cream) for a short time.  The intestines often can lose the ability to digest lactose when stressed. Avoid foods that cause gassiness or bloating.  Typical foods include beans and other legumes, cabbage, broccoli, and dairy foods.  Avoid greasy, spicy, fast foods.  Every person has some sensitivity to other foods, so listen to your body and avoid those foods that trigger problems for you. Probiotics (such as active yogurt, Align, etc) may help repopulate the intestines and colon with normal bacteria and calm down a sensitive digestive tract Adding a fiber supplement gradually can help thicken stools by absorbing excess fluid and retrain the intestines to act more normally.  Slowly increase the dose over a few weeks.  Too much fiber too soon can backfire and cause cramping & bloating. It is okay to try and slow down diarrhea with a few doses of  antidiarrheal medicines.   Bismuth subsalicylate (ex. Kayopectate, Pepto Bismol) for a few doses can help control diarrhea.  Avoid if pregnant.   Loperamide (Imodium) can slow down diarrhea.  Start with one tablet ($RemoveBef'2mg'[id number]$ ) first.  Avoid if you are having fevers or severe pain.  ILEOSTOMY PATIENTS WILL HAVE CHRONIC DIARRHEA since their colon is not in use.    Drink plenty of liquids.  You will need to drink even more glasses of water/liquid a day to avoid getting dehydrated. Record output from your ileostomy.  Expect to empty the bag every 3-4 hours at first.  Most people with a permanent ileostomy empty their bag 4-6 times at the least.   Use antidiarrheal medicine (especially Imodium) several times a day to avoid getting dehydrated.  Start with a dose at bedtime & breakfast.  Adjust up or down as needed.  Increase antidiarrheal medications as directed to avoid emptying the bag more than 8 times a day (every 3 hours). Work with your wound ostomy nurse to learn care for your ostomy.  See ostomy care instructions. TROUBLESHOOTING IRREGULAR BOWELS 1) Start with a soft & bland diet. No spicy, greasy, or fried foods.  2) Avoid gluten/wheat or dairy products from diet to see if symptoms improve. 3) Miralax 17gm or flax seed mixed in Cloud. water or juice-daily. May use 2-4 times a day as needed. 4) Gas-X, Phazyme, etc. as needed for gas & bloating.  5) Prilosec (omeprazole) over-the-counter as needed 6)  Consider probiotics (Align, Activa, etc) to help calm the bowels down  Call your doctor if you are getting worse or not getting better.  Sometimes further testing (cultures, endoscopy, X-ray studies, CT scans, bloodwork, etc.) may be  needed to help diagnose and treat the cause of the diarrhea. [facility], [place], [place], [place], [place]  [address] [phone] - Main.    [phone]  - Toll Free.   [phone] - Fax [email or web address]   EATING AFTER A  SMALL BOWEL OBSTRUCTION   EAT Gradually transition to a high fiber diet with a fiber supplement over the next few days after discharge  WALK Walk an hour a day.  Control your pain to do that.    CONTROL PAIN Control pain so that you can walk, sleep, tolerate sneezing/coughing, go up/down stairs.  HAVE A BOWEL MOVEMENT DAILY Keep your bowels regular to avoid problems.  OK to try a laxative to override constipation.  OK to use an antidairrheal to slow down diarrhea.  Call if not better after 2 tries  CALL IF YOU HAVE PROBLEMS/CONCERNS Call if you are still struggling despite following these instructions. Call if you have concerns not answered by these instructions     After your BOWEL OBSTRUCTION, expect some issues over the next few weeks.    To help you through this temporary phase, we start you out on a pureed (blenderized) diet.  Your first meal in the hospital was thin liquids.  You should have been given a pureed diet by the time you left the hospital.  We ask patients to stay on a pureed diet for the first few days to avoid anything getting "stuck."  Don't be alarmed if your ability to swallow doesn't progress according to this plan.  Everyone is different and some diets can advance more or less quickly.     Some BASIC RULES to follow are:  Maintain an upright position whenever eating or drinking.  Take small bites - just a teaspoon size bite at a time.  Eat slowly.  It may also help to eat only one food at a time.  Consider nibbling through smaller, more frequent meals & avoid the urge to eat BIG meals  Do not push through feelings of fullness, nausea, or bloatedness  Do not mix solid foods and liquids in the same mouthful  Try not to "wash foods down" with large gulps of liquids.  Avoid carbonated (bubbly/fizzy) drinks.    Avoid foods that make you feel gassy or bloated.  Start with bland foods first.  Wait on trying greasy, fried, or spicy meals until you are  tolerating more bland solids well.  Expect to be more gassy/flatulent/bloated initially.  Walking will help your body manage it better.  Consider using medications for bloating that contain simethicone such as  Maalox or Gas-X   Eat in a relaxed atmosphere & minimize distractions.  Avoid talking while eating.    Do not use straws.  Following each meal, sit in an upright position (90 degree angle) for 60 to 90 minutes.  Going for a short walk can help as well  If food does stick, don't panic.  Try to relax and let the food pass on its own.  Sipping WARM LIQUID such as strong hot black tea can also help slide it down.   Be gradual in changes & use common sense:  -If you easily tolerating a certain "level" of foods, advance to the next level gradually -If you are having trouble swallowing a particular food, then avoid it.   -If food is sticking when you advance your diet, go back to thinner previous diet (the lower LEVEL) for 1-2 days.  LEVEL 1 =  PUREED DIET  Do for the first Walker in this group are pureed or blenderized to a smooth, mashed potato-like consistency.  -If necessary, the pureed foods can keep their shape with the addition of a thickening agent.   -Meat should be pureed to a smooth, pasty consistency.  Hot broth or gravy may be added to the pureed meat, approximately 1 oz. of liquid per 3 oz. serving of meat. -CAUTION:  If any foods do not puree into a smooth consistency, swallowing will be more difficult.  (For example, nuts or seeds sometimes do not blend well.)  Hot Foods Cold Foods  Pureed scrambled eggs and cheese Pureed cottage cheese  Baby cereals Thickened juices and nectars  Thinned cooked cereals (no lumps) Thickened milk or eggnog  Pureed [place] toast or pancakes Ensure  Mashed potatoes Ice cream  Pureed parsley, au gratin, scalloped potatoes, candied sweet potatoes Fruit or [place] ice, sherbet  Pureed buttered or  alfredo noodles Plain yogurt  Pureed vegetables (no corn or peas) Instant breakfast  Pureed soups and creamed soups Smooth pudding, mousse, custard  Pureed scalloped apples Whipped gelatin  Gravies Sugar, syrup, honey, jelly  Sauces, cheese, tomato, barbecue, white, creamed Cream  Any baby food Creamer  Alcohol in moderation (not beer or champagne) Margarine  Coffee or tea Mayonnaise   Ketchup, mustard   Apple sauce   SAMPLE MENU:  PUREED DIET Breakfast Lunch Dinner   Orange juice, 1/2 cup  Cream of wheat, 1/2 cup  Pineapple juice, 1/2 cup  Pureed [place], barley soup, 3/4 cup  Pureed Hawaiian chicken, 3 oz   Scrambled eggs, mashed or blended with cheese, 1/2 cup  Tea or coffee, 1 cup   Whole milk, 1 cup   Non-dairy creamer, 2 Tbsp.  Mashed potatoes, 1/2 cup  Pureed cooled broccoli, 1/2 cup  Apple sauce, 1/2 cup  Coffee or tea  Mashed potatoes, 1/2 cup  Pureed spinach, 1/2 cup  Frozen yogurt, 1/2 cup  Tea or coffee      LEVEL 2 = SOFT DIET  After your first few days, you can advance to a soft diet.   Keep on this diet until everything goes down easily.  Hot Foods Cold Foods  White fish Cottage cheese  Stuffed fish Junior baby fruit  Baby food meals Semi thickened juices  Minced soft cooked, scrambled, poached eggs nectars  Souffle & omelets Ripe mashed bananas  Cooked cereals Canned fruit, pineapple sauce, milk  potatoes Milkshake  Buttered or Alfredo noodles Custard  Cooked cooled vegetable Puddings, including tapioca  Sherbet Yogurt  Vegetable soup or alphabet soup Fruit ice, [place] ice  Gravies Whipped gelatin  Sugar, syrup, honey, jelly Junior baby desserts  Sauces:  Cheese, creamed, barbecue, tomato, white Cream  Coffee or tea Margarine   SAMPLE MENU:  LEVEL 2 Breakfast Lunch Dinner   Orange juice, 1/2 cup  Oatmeal, 1/2 cup  Scrambled eggs with cheese, 1/2 cup  Decaffeinated tea, 1 cup  Whole milk, 1 cup  Non-dairy creamer, 2  Tbsp  Pineapple juice, 1/2 cup  Minced beef, 3 oz  Gravy, 2 Tbsp  Mashed potatoes, 1/2 cup  Minced fresh broccoli, 1/2 cup  Applesauce, 1/2 cup  Coffee, 1 cup  [place], barley soup, 3/4 cup  Minced Hawaiian chicken, 3 oz  Mashed potatoes, 1/2 cup  Cooked spinach, 1/2 cup  Frozen yogurt, 1/2 cup  Non-dairy creamer, 2 Tbsp      LEVEL 3 = CHOPPED  DIET  -After all the foods in level 2 (soft diet) are passing through well you should advance up to more chopped foods.  -It is still important to cut these foods into small pieces and eat slowly.  Hot Foods Cold Foods  Poultry Cottage cheese  Chopped Swedish meatballs Yogurt  Meat salads (ground or flaked meat) Milk  Flaked fish (tuna) Milkshakes  Poached or scrambled eggs Soft, cold, dry cereal  Souffles and omelets Fruit juices or nectars  Cooked cereals Chopped canned fruit  Chopped [place] toast or pancakes Canned fruit cocktail  Noodles or pasta (no rice) Pudding, mousse, custard  Cooked vegetables (no frozen peas, corn, or mixed vegetables) Green salad  Canned small sweet peas Ice cream  Creamed soup or vegetable soup Fruit ice, [place] ice  Pureed vegetable soup or alphabet soup Non-dairy creamer  Ground scalloped apples Margarine  Gravies Mayonnaise  Sauces:  Cheese, creamed, barbecue, tomato, white Ketchup  Coffee or tea Mustard   SAMPLE MENU:  LEVEL 3 Breakfast Lunch Dinner   Orange juice, 1/2 cup  Oatmeal, 1/2 cup  Scrambled eggs with cheese, 1/2 cup  Decaffeinated tea, 1 cup  Whole milk, 1 cup  Non-dairy creamer, 2 Tbsp  Ketchup, 1 Tbsp  Margarine, 1 tsp  Salt, 1/4 tsp  Sugar, 2 tsp  Pineapple juice, 1/2 cup  Ground beef, 3 oz  Gravy, 2 Tbsp  Mashed potatoes, 1/2 cup  Cooked spinach, 1/2 cup  Applesauce, 1/2 cup  Decaffeinated coffee  Whole milk  Non-dairy creamer, 2 Tbsp  Margarine, 1 tsp  Salt, 1/4 tsp  Pureed [place], barley soup, 3/4 cup  Barbecue chicken, 3  oz  Mashed potatoes, 1/2 cup  Ground fresh broccoli, 1/2 cup  Frozen yogurt, 1/2 cup  Decaffeinated tea, 1 cup  Non-dairy creamer, 2 Tbsp  Margarine, 1 tsp  Salt, 1/4 tsp  Sugar, 1 tsp    LEVEL 4:  REGULAR FOODS  -Foods in this group are soft, moist, regularly textured foods.   -This level includes meat and breads, which tend to be the hardest things to swallow.   -Eat very slowly, chew well and continue to avoid carbonated drinks. -most people are at this level in 2-4 weeks  Hot Foods Cold Foods  Baked fish or skinned Soft cheeses - cottage cheese  Souffles and omelets Cream cheese  Eggs Yogurt  Stuffed shells Milk  Spaghetti with meat sauce Milkshakes  Cooked cereal Cold dry cereals (no nuts, dried fruit, coconut)  [place] toast or pancakes Crackers  Buttered toast Fruit juices or nectars  Noodles or pasta (no rice) Canned fruit  Potatoes (all types) Ripe bananas  Soft, cooked vegetables (no corn, lima, or baked beans) Peeled, ripe, fresh fruit  Creamed soups or vegetable soup Cakes (no nuts, dried fruit, coconut)  Canned chicken noodle soup Plain doughnuts  Gravies Ice cream  Bacon dressing Pudding, mousse, custard  Sauces:  Cheese, creamed, barbecue, tomato, white Fruit ice, [place] ice, sherbet  Decaffeinated tea or coffee Whipped gelatin  Pork chops Regular gelatin   Canned fruited gelatin molds   Sugar, syrup, honey, jam, jelly   Cream   Non-dairy   Margarine   Oil   Mayonnaise   Ketchup   Mustard   TROUBLESHOOTING IRREGULAR BOWELS  1) Avoid extremes of bowel movements (no bad constipation/diarrhea)  2) Miralax 17gm mixed in 8oz. water or juice-daily. May use BID as needed.  3) Gas-x,Phazyme, etc. as needed for gas & bloating.  4) Soft,bland diet. No spicy,greasy,fried  foods.  5) Prilosec over-the-counter as needed  6) May hold gluten/wheat products from diet to see if symptoms improve.  7) May try probiotics (Align, Activa, etc) to help calm the  bowels down  7) If symptoms become worse call back immediately.    If you have any questions please call our office at [place]: [phone].   Small Bowel Obstruction A small bowel obstruction is a blockage in the small bowel. The small bowel, which is also called the small intestine, is a long, slender tube that connects the stomach to the colon. When a person eats and drinks, food and fluids go from the stomach to the small bowel. This is where most of the nutrients in the food and fluids are absorbed. A small bowel obstruction will prevent food and fluids from passing through the small bowel as they normally do during digestion. The small bowel can become partially or completely blocked. This can cause symptoms such as abdominal pain, vomiting, and bloating. If this condition is not treated, it can be dangerous because the small bowel could rupture. CAUSES Common causes of this condition include: 2. Scar tissue from previous surgery or radiation treatment. 3. Recent surgery. This may cause the movements of the bowel to slow down and cause food to block the intestine. 4. Hernias. 5. Inflammatory bowel disease (colitis). 6. Twisting of the bowel (volvulus). 7. Tumors. 8. A foreign body. 9. Slipping of a part of the bowel into another part (intussusception). SYMPTOMS Symptoms of this condition include: 2. Abdominal pain. This may be dull cramps or sharp pain. It may occur in one area, or it may be present in the entire abdomen. Pain can range from mild to severe, depending on the degree of obstruction. 3. Nausea and vomiting. Vomit may be greenish or a yellow bile color. 4. Abdominal bloating. 5. Constipation. 6. Lack of passing gas. 7. Frequent belching. 8. Diarrhea. This may occur if the obstruction is partial and runny stool is able to leak around the obstruction. DIAGNOSIS This condition may be diagnosed based on a physical exam, medical history, and X-rays of the  abdomen. You may also have other tests, such as a CT scan of the abdomen and pelvis. TREATMENT Treatment for this condition depends on the cause and severity of the problem. Treatment options may include: 2. Bed rest along with fluids and pain medicines that are given through an IV tube inserted into one of your veins. Sometimes, this is all that is needed for the obstruction to improve. 3. Following a simple diet. In some cases, a clear liquid diet may be required for several days. This allows the bowel to rest. 4. Placement of a small tube (nasogastric tube) into the stomach. When the bowel is blocked, it usually swells up like a balloon that is filled with air and fluids. The air and fluids may be removed by suction through the nasogastric tube. This can help with pain, discomfort, and nausea. It can also help the obstruction to clear up faster. 5. Surgery. This may be required if other treatments do not work. Bowel obstruction from a hernia may require early surgery and can be an emergency procedure. Surgery may also be required for scar tissue that causes frequent or severe obstructions. HOME CARE INSTRUCTIONS  Get plenty of rest.  Follow instructions from your health care provider about eating restrictions. You may need to avoid solid foods and consume only clear liquids until your condition improves.  Take over-the-counter and prescription medicines  only as told by your health care provider.  Keep all follow-up visits as told by your health care provider. This is important. SEEK MEDICAL CARE IF: 2. You have a fever. 3. You have chills. SEEK IMMEDIATE MEDICAL CARE IF: 2. You have increased pain or cramping. 3. You vomit blood. 4. You have uncontrolled vomiting or nausea. 5. You cannot drink fluids because of vomiting or pain. 6. You develop confusion. 7. You begin feeling very dry or thirsty (dehydrated). 8. You have severe bloating. 9. You feel extremely weak or you faint.   This  information is not intended to replace advice given to you by your health care provider. Make sure you discuss any questions you have with your health care provider.

## 2016-05-15 NOTE — Care Management Important Message (Signed)
Important Message  Patient Details  Name: Bailey [name] MRN: [id number] Date of Birth: 1950-12-31   Medicare Important Message Given:  Yes    Kerin Salen 05/15/2016, 11:52 AMImportant Message  Patient Details  Name: Bailey [name] MRN: [id number] Date of Birth: 08/27/1950   Medicare Important Message Given:  Yes    Kerin Salen 05/15/2016, 11:52 AM

## 2016-05-15 NOTE — Progress Notes (Signed)
Called Dr. Wynelle Cleveland concerning left leg being swollen. D/C was placed on hold for a doppler to be done in the am to rule out a DVT.  Pt is having no pain, no redness, and a negative Homan's sign.  Will continue to monitor.  Also, an order was given allowing [place] to saline lock her iv.  Pt is tolerating her diet and has been ambulating in the hall today.

## 2016-05-15 NOTE — Progress Notes (Signed)
6 Days Post-Op  Subjective: Had bowl of potato soup and chocolate pudding and juice last night. +flatus. 2 BMs.  Less burping. Some soreness. Grandson's 18th bday is sat  Objective: Vital signs in last 24 hours: Temp:  [98.2 F (36.8 C)-98.9 F (37.2 C)] 98.9 F (37.2 C) (03/23 0436) Pulse Rate:  [71-78] 76 (03/23 0436) Resp:  [16-18] 18 (03/23 0436) BP: (146-152)/(69-74) 147/74 (03/23 0436) SpO2:  [98 %-99 %] 98 % (03/23 0436) Weight:  [76.5 kg (168 lb 10.4 oz)] 76.5 kg (168 lb 10.4 oz) (03/23 0436) Last BM Date: 05/14/16  Intake/Output from previous day: 03/22 0701 - 03/23 0700 In: 2550 [P.O.:600; I.V.:1900; IV Piggyback:50] Out: 700 [Urine:700] Intake/Output this shift: No intake/output data recorded.  Alert, nad, nontoxic cta Soft, a little full but not distended, +BS, incisions c/d/I, nontender  Lab Results:   Recent Labs  05/14/16 0443 05/15/16 0411  WBC 6.1 4.7  HGB 8.6* 8.2*  HCT 25.2* 24.5*  PLT 246 214   BMET  Recent Labs  05/14/16 0443 05/15/16 0411  NA 138 139  K 3.0* 3.0*  CL 103 102  CO2 29 31  GLUCOSE 122* 110*  BUN 7 6  CREATININE 0.63 0.75  CALCIUM 7.5* 7.6*   PT/INR No results for input(s): LABPROT, INR in the last 72 hours. ABG No results for input(s): PHART, HCO3 in the last 72 hours.  Invalid input(s): PCO2, PO2  Studies/Results: No results found.  Anti-infectives: Anti-infectives    Start     Dose/Rate Route Frequency Ordered Stop   05/09/16 2200  cefoTEtan (CEFOTAN) 2 g in dextrose 5 % 50 mL IVPB     2 g 100 mL/hr over 30 Minutes Intravenous Every 12 hours 05/09/16 1228 05/09/16 2230   05/09/16 0600  cefoTEtan (CEFOTAN) 2 g in dextrose 5 % 50 mL IVPB  Status:  Discontinued     2 g 100 mL/hr over 30 Minutes Intravenous On call to O.R. 05/08/16 2137 05/08/16 2142   05/09/16 0600  cefoTEtan (CEFOTAN) 2 g in dextrose 5 % 50 mL IVPB  Status:  Discontinued     2 g 100 mL/hr over 30 Minutes Intravenous On call to O.R. 05/08/16  2141 05/09/16 1211   05/08/16 2200  cefTRIAXone (ROCEPHIN) 2 g in dextrose 5 % 50 mL IVPB  Status:  Discontinued    Comments:  Pharmacy may adjust dosing strength / duration / interval for maximal efficacy   2 g 100 mL/hr over 30 Minutes Intravenous Every 24 hours 05/08/16 2119 05/09/16 1228   05/06/16 0600  Ampicillin-Sulbactam (UNASYN) 3 g in sodium chloride 0.9 % 100 mL IVPB  Status:  Discontinued     3 g 200 mL/hr over 30 Minutes Intravenous Every 6 hours 05/06/16 0220 05/08/16 2119   05/05/16 2215  piperacillin-tazobactam (ZOSYN) IVPB 3.375 g     3.375 g 100 mL/hr over 30 Minutes Intravenous  Once 05/05/16 2211 05/05/16 2303      Assessment/Plan: s/p Procedure(s): LAPAROSCOPY DIAGNOSTIC, LYSIS OF ADHESIONS, SMALL BOWEL RESECTION X 2 (N/A) LAPAROSCOPIC SMALL BOWEL RESECTION (N/A) by Dr Johney Maine  - POD 6 - path: DIVERTICULI WITH ASSOCIATED ULCERATION AND ACUTE INFLAMMATION, NO EVIDENCE OF MALIGNANCY  DM poorly controlled Chronic normocytic anemia UTI  FEN - dec IVF, adv diet to soft,  hypokalemia - replace potassium VTE - lovenox  I think if she tolerates lunch without n/v, she can go home this pm. Will have PA go ahead and put f/u, etc on chart.  Leighton Ruff. Redmond Pulling, MD, FACS General, Bariatric, & Minimally Invasive Surgery [facility], [place]   LOS: 10 days    Gayland Curry 05/15/2016

## 2016-05-16 ENCOUNTER — Inpatient Hospital Stay (HOSPITAL_COMMUNITY): Payer: Medicare HMO

## 2016-05-16 DIAGNOSIS — M7989 Other specified soft tissue disorders: Secondary | ICD-10-CM

## 2016-05-16 LAB — GLUCOSE, CAPILLARY: GLUCOSE-CAPILLARY: 110 mg/dL — AB (ref 65–99)

## 2016-05-16 MED ORDER — HYDROCODONE-ACETAMINOPHEN 5-325 MG PO TABS: 1.0000 | ORAL_TABLET | ORAL | 0 refills | Status: DC | PRN

## 2016-05-16 NOTE — Progress Notes (Signed)
  General Surgery [facility]  Assessment & Plan:  POD#7 - lap small bowel resection  Tolerating diet  Anticipating discharge home today  Will arrange follow up at [place] office        Earnstine Regal, MD, [facility]       Office: [phone]    Subjective: Patient up in chair, comfortable.  Objective: Vital signs in last 24 hours: Temp:  [98.3 F (36.8 C)-98.6 F (37 C)] 98.6 F (37 C) (03/24 0515) Pulse Rate:  [73-82] 73 (03/24 0515) Resp:  [18] 18 (03/24 0515) BP: (125-152)/(56-75) 125/56 (03/24 0515) SpO2:  [98 %-99 %] 99 % (03/24 0515) Last BM Date: 05/15/16  Intake/Output from previous day: 03/23 0701 - 03/24 0700 In: 3460 [P.O.:960; I.V.:2500] Out: 1300 [Urine:1300] Intake/Output this shift: No intake/output data recorded.  Physical Exam: HEENT - sclerae clear, mucous membranes moist Neck - soft Abdomen - soft, mild distension; wound dry and intact Ext - no edema, non-tender Neuro - alert & oriented, no focal deficits  Lab Results:   Recent Labs  05/14/16 0443 05/15/16 0411  WBC 6.1 4.7  HGB 8.6* 8.2*  HCT 25.2* 24.5*  PLT 246 214   BMET  Recent Labs  05/14/16 0443 05/15/16 0411  NA 138 139  K 3.0* 3.0*  CL 103 102  CO2 29 31  GLUCOSE 122* 110*  BUN 7 6  CREATININE 0.63 0.75  CALCIUM 7.5* 7.6*   PT/INR No results for input(s): LABPROT, INR in the last 72 hours. Comprehensive Metabolic Panel:    Component Value Date/Time   NA 139 05/15/2016 0411   NA 138 05/14/2016 0443   K 3.0 (L) 05/15/2016 0411   K 3.0 (L) 05/14/2016 0443   CL 102 05/15/2016 0411   CL 103 05/14/2016 0443   CO2 31 05/15/2016 0411   CO2 29 05/14/2016 0443   BUN 6 05/15/2016 0411   BUN 7 05/14/2016 0443   CREATININE 0.75 05/15/2016 0411   CREATININE 0.63 05/14/2016 0443   CREATININE 0.74 02/10/2014 1220   GLUCOSE 110 (H) 05/15/2016 0411   GLUCOSE 122 (H) 05/14/2016 0443   CALCIUM 7.6 (L) 05/15/2016 0411   CALCIUM 7.5 (L) 05/14/2016 0443   AST 10 (L) 05/06/2016 0450   AST 11 (L) 05/05/2016 1923   ALT 9 (L) 05/06/2016 0450   ALT 11 (L) 05/05/2016 1923   ALKPHOS 90 05/06/2016 0450   ALKPHOS 115 05/05/2016 1923   BILITOT 0.6 05/06/2016 0450   BILITOT 0.7 05/05/2016 1923   PROT 6.1 (L) 05/06/2016 0450   PROT 6.9 05/05/2016 1923   ALBUMIN 2.8 (L) 05/06/2016 0450   ALBUMIN 3.3 (L) 05/05/2016 1923    Studies/Results: No results found.    Bailey [name] M 05/16/2016  Patient ID: Bailey [name], female   DOB: [birth year], 66 y.o.   MRN: [id number]

## 2016-05-16 NOTE — Progress Notes (Signed)
**  Preliminary report by tech**  Left lower extremity venous duplex complete. There is no evidence of deep or superficial vein thrombosis involving the left lower extremity. All visualized vessels appear patent and compressible. There is no evidence of a Baker's cyst on the left.  05/16/16 8:10 AM Bailey [name] RVT

## 2016-05-16 NOTE — Progress Notes (Signed)
[facility]  Patient was not discharged yesterday due to LLE edema. Due to bedbound state for > 1 wk, venous duplex was checked today. This was negative. OK to d/c home. See d/c summary from 3/23 which I have updated.   Debbe Odea, MD

## 2016-05-18 DIAGNOSIS — E1142 Type 2 diabetes mellitus with diabetic polyneuropathy: Secondary | ICD-10-CM | POA: Diagnosis not present

## 2016-05-18 DIAGNOSIS — K56609 Unspecified intestinal obstruction, unspecified as to partial versus complete obstruction: Secondary | ICD-10-CM | POA: Diagnosis not present

## 2016-05-18 DIAGNOSIS — K5792 Diverticulitis of intestine, part unspecified, without perforation or abscess without bleeding: Secondary | ICD-10-CM | POA: Diagnosis not present

## 2016-06-08 DIAGNOSIS — D649 Anemia, unspecified: Secondary | ICD-10-CM | POA: Diagnosis not present

## 2016-06-09 DIAGNOSIS — E1142 Type 2 diabetes mellitus with diabetic polyneuropathy: Secondary | ICD-10-CM | POA: Diagnosis not present

## 2016-06-09 DIAGNOSIS — Z1211 Encounter for screening for malignant neoplasm of colon: Secondary | ICD-10-CM | POA: Diagnosis not present

## 2016-06-09 DIAGNOSIS — D509 Iron deficiency anemia, unspecified: Secondary | ICD-10-CM | POA: Diagnosis not present

## 2016-06-09 DIAGNOSIS — R21 Rash and other nonspecific skin eruption: Secondary | ICD-10-CM | POA: Diagnosis not present

## 2016-07-14 ENCOUNTER — Ambulatory Visit
Admission: RE | Admit: 2016-07-14 | Discharge: 2016-07-14 | Disposition: A | Discharge status: Home / Self Care | Payer: Medicare HMO | Source: Ambulatory Visit | Attending: Family Medicine | Admitting: Family Medicine

## 2016-07-14 ENCOUNTER — Ambulatory Visit (INDEPENDENT_AMBULATORY_CARE_PROVIDER_SITE_OTHER): Payer: Self-pay | Admitting: Orthopedic Surgery

## 2016-07-14 ENCOUNTER — Other Ambulatory Visit: Payer: Self-pay | Admitting: Family Medicine

## 2016-07-14 DIAGNOSIS — M8610 Other acute osteomyelitis, unspecified site: Secondary | ICD-10-CM

## 2016-07-14 DIAGNOSIS — Z6823 Body mass index (BMI) 23.0-23.9, adult: Secondary | ICD-10-CM | POA: Diagnosis not present

## 2016-07-14 DIAGNOSIS — M79674 Pain in right toe(s): Secondary | ICD-10-CM | POA: Diagnosis not present

## 2016-07-14 DIAGNOSIS — S91301A Unspecified open wound, right foot, initial encounter: Secondary | ICD-10-CM | POA: Diagnosis not present

## 2016-07-14 DIAGNOSIS — E1142 Type 2 diabetes mellitus with diabetic polyneuropathy: Secondary | ICD-10-CM | POA: Diagnosis not present

## 2016-07-14 DIAGNOSIS — E1159 Type 2 diabetes mellitus with other circulatory complications: Secondary | ICD-10-CM | POA: Diagnosis not present

## 2016-07-17 ENCOUNTER — Ambulatory Visit (INDEPENDENT_AMBULATORY_CARE_PROVIDER_SITE_OTHER): Payer: Medicare HMO | Admitting: Family

## 2016-07-17 DIAGNOSIS — E11621 Type 2 diabetes mellitus with foot ulcer: Secondary | ICD-10-CM

## 2016-07-17 DIAGNOSIS — L97523 Non-pressure chronic ulcer of other part of left foot with necrosis of muscle: Secondary | ICD-10-CM

## 2016-07-17 MED ORDER — MUPIROCIN 2 % EX OINT: 1.0000 "application " | TOPICAL_OINTMENT | Freq: Two times a day (BID) | CUTANEOUS | 6 refills | Status: DC

## 2016-07-21 NOTE — Progress Notes (Signed)
Office Visit Note   Patient: Bailey [name]           Date of Birth: 1950-07-29           MRN: [id number] Visit Date: 07/17/2016              Requested by: [name], [address] [place], [place] [address] PCP: [name], MD  No chief complaint on file.     HPI: The patient is a 66 year old woman who presents today complaining of a right foot callus. Is concerned for infection. Was seen and evaluated by her primary care last used weight. Was started on Keflex. Complaints today for foul odor and yellow drainage from her ulcer. Did have radiographs of her great toe which were negative for osteomyelitis last week.  These were independently reviewed today by me.  Assessment & Plan: Visit Diagnoses:  1. 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 She'll continue the Keflex. Follow-up in 2 weeks. Have provided her a Darco shoe to offload the forefoot.  Follow-Up Instructions: Return in about 2 weeks (around 07/31/2016).   Ortho Exam  Patient is alert, oriented, no adenopathy, well-dressed, normal affect, normal respiratory effort. Examination the right foot is plantigrade. There is a palpable callus beneath the first metatarsal head. This is debrided of nonviable tissue there is underlying callus that is 1 cm in diameter this probes 7 mm deep. Does not probe to bone or fascia. There is necrotic tissue in the wound bed. There is scant serous drainage today on exam there is no surrounding erythema no cellulitis.  Imaging: No results found.  Labs: Lab Results  Component Value Date   HGBA1C 10.2 (H) 05/06/2016   HGBA1C 12.9 (H) 02/11/2014   HGBA1C (H) 04/14/2010    12.6 (NOTE)                                                                       According to the ADA Clinical Practice Recommendations for 2011, when HbA1c is used as a screening test:   >=6.5%    Diagnostic of Diabetes Mellitus           (if abnormal result  is confirmed)  5.7-6.4%   Increased risk of developing Diabetes Mellitus  References:Diagnosis and Classification of Diabetes Mellitus,Diabetes WGYK,5993,57(SVXBL 1):S62-S69 and Standards of Medical Care in         Diabetes - 2011,Diabetes Care,2011,34  (Suppl 1):S11-S61.   ESRSEDRATE 108 (H) 04/13/2010   REPTSTATUS 05/07/2016 FINAL 05/05/2016   GRAMSTAIN  02/11/2014    ABUNDANT WBC PRESENT,BOTH PMN AND MONONUCLEAR NO SQUAMOUS EPITHELIAL CELLS SEEN FEW GRAM POSITIVE COCCI IN PAIRS IN CLUSTERS Performed at [place]  02/11/2014    ABUNDANT WBC PRESENT,BOTH PMN AND MONONUCLEAR NO SQUAMOUS EPITHELIAL CELLS SEEN FEW GRAM POSITIVE COCCI IN PAIRS IN CLUSTERS Performed at [place], SUGGEST RECOLLECTION (A) 05/05/2016   LABORGA STAPHYLOCOCCUS AUREUS 02/11/2014    Orders:  No orders of the defined types were placed in this encounter.  Meds ordered this encounter  Medications  . DISCONTD: mupirocin ointment (BACTROBAN) 2 %    Sig: Apply 1 application topically 2 (two) times daily.    Dispense:  22 g    Refill:  6  . mupirocin ointment (BACTROBAN) 2 %    Sig: Apply 1 application topically 2 (two) times daily.    Dispense:  22 g    Refill:  6     Procedures: No procedures performed  Clinical Data: No additional findings.  ROS:  All other systems negative, except as noted in the HPI. Review of Systems  Constitutional: Negative for chills and fever.  Cardiovascular: Negative for leg swelling.  Skin: Positive for wound. Negative for color change.    Objective: Vital Signs: There were no vitals taken for this visit.  Specialty Comments:  No specialty comments available.  PMFS History: Patient Active Problem List   Diagnosis Date Noted  . Diabetic ulcer of left foot ([place]) 07/17/2016  . Type II diabetes mellitus, uncontrolled ([place])   . Acute  renal failure (ARF) ([place]) 05/10/2016  . SBO (small bowel obstruction) ([place]) 05/08/2016  . Diverticular disease of jejunum s/p resection 05/09/2016 05/08/2016  . UTI (urinary tract infection) 05/06/2016  . Dehydration   . Normocytic anemia 05/05/2016   Past Medical History:  Diagnosis Date  . Diabetes mellitus     Family History  Problem Relation Age of Onset  . Diabetes Mother   . Asthma Sister     Past Surgical History:  Procedure Laterality Date  . INCISION AND DRAINAGE ABSCESS Left 02/11/2014   Procedure: INCISION AND DRAINAGE ABSCESS Left Buttock;  Surgeon: Jackolyn Confer, MD;  Location: [facility];  Service: General;  Laterality: Left;  . LAPAROSCOPIC SMALL BOWEL RESECTION N/A 05/09/2016   Procedure: LAPAROSCOPIC SMALL BOWEL RESECTION;  Surgeon: Michael Boston, MD;  Location: [facility];  Service: General;  Laterality: N/A;  . LAPAROSCOPY N/A 05/09/2016   Procedure: LAPAROSCOPY DIAGNOSTIC, LYSIS OF ADHESIONS, SMALL BOWEL RESECTION X 2;  Surgeon: Michael Boston, MD;  Location: [facility];  Service: General;  Laterality: N/A;  . TOE AMPUTATION     left foot great toe   Social History   Occupational History  . Not on file.   Social History Main Topics  . Smoking status: Never Smoker  . Smokeless tobacco: Never Used  . Alcohol use No  . Drug use: No  . Sexual activity: Not on file

## 2016-07-24 ENCOUNTER — Encounter (INDEPENDENT_AMBULATORY_CARE_PROVIDER_SITE_OTHER): Payer: Self-pay | Admitting: Family

## 2016-07-24 ENCOUNTER — Ambulatory Visit (INDEPENDENT_AMBULATORY_CARE_PROVIDER_SITE_OTHER): Payer: Medicare HMO | Admitting: Family

## 2016-07-24 VITALS — Ht 64.0 in | Wt 168.0 lb

## 2016-07-24 DIAGNOSIS — L97513 Non-pressure chronic ulcer of other part of right foot with necrosis of muscle: Secondary | ICD-10-CM | POA: Diagnosis not present

## 2016-07-24 DIAGNOSIS — E08621 Diabetes mellitus due to underlying condition with foot ulcer: Secondary | ICD-10-CM | POA: Diagnosis not present

## 2016-07-24 NOTE — Progress Notes (Signed)
Office Visit Note   Patient: Bailey [name]           Date of Birth: 01/18/1951           MRN: [id number] Visit Date: 07/24/2016              Requested by: [name], [address], [place] [address] PCP: [name], MD  Chief Complaint  Patient presents with  . Right Foot - Wound Check      HPI: The patient is a 66 year old woman who presents today in follow up for right foot ulceration. Is concerned for continued odor. Has been taking Keflex. Is full weight bearing in a Darco.  Did have recent radiographs of her great toe which were negative for osteomyelitis.   Assessment & Plan: Visit Diagnoses:  1. Diabetic ulcer of other part of right foot associated with diabetes mellitus due to underlying condition, with necrosis of muscle ([place])     Plan: Ulcer debrided. Follow-up for MRI review. Continue the Darco shoe to offload the forefoot. Continue antibiotics, daily wound cleansing, antibacterial ointment dressings.  Follow-Up Instructions: Return for p mri.   Ortho Exam  Patient is alert, oriented, no adenopathy, well-dressed, normal affect, normal respiratory effort. Examination the right foot is plantigrade. There is ulceration beneath the first metatarsal head. This is debrided of nonviable tissue there is underlying callus that is 8 mm in diameter this probes 7 mm deep. Does not probe to bone or fascia. There is necrotic tissue in the wound bed. no drainage today on exam there is no surrounding erythema no cellulitis.  Imaging: No results found.  Labs: Lab Results  Component Value Date   HGBA1C 10.2 (H) 05/06/2016   HGBA1C 12.9 (H) 02/11/2014   HGBA1C (H) 04/14/2010    12.6 (NOTE)                                                                       According to the ADA Clinical Practice Recommendations for 2011, when HbA1c is used as a screening test:   >=6.5%   Diagnostic of Diabetes Mellitus           (if abnormal result  is  confirmed)  5.7-6.4%   Increased risk of developing Diabetes Mellitus  References:Diagnosis and Classification of Diabetes Mellitus,Diabetes XBJY,7829,56(OZHYQ 1):S62-S69 and Standards of Medical Care in         Diabetes - 2011,Diabetes Care,2011,34  (Suppl 1):S11-S61.   ESRSEDRATE 108 (H) 04/13/2010   REPTSTATUS 05/07/2016 FINAL 05/05/2016   GRAMSTAIN  02/11/2014    ABUNDANT WBC PRESENT,BOTH PMN AND MONONUCLEAR NO SQUAMOUS EPITHELIAL CELLS SEEN FEW GRAM POSITIVE COCCI IN PAIRS IN CLUSTERS Performed at [place]  02/11/2014    ABUNDANT WBC PRESENT,BOTH PMN AND MONONUCLEAR NO SQUAMOUS EPITHELIAL CELLS SEEN FEW GRAM POSITIVE COCCI IN PAIRS IN CLUSTERS Performed at [place], SUGGEST RECOLLECTION (A) 05/05/2016   LABORGA STAPHYLOCOCCUS AUREUS 02/11/2014    Orders:  Orders Placed This Encounter  Procedures  . MR Foot Right w/o contrast   No orders of the defined types were placed in this encounter.    Procedures: No procedures  performed  Clinical Data: No additional findings.  ROS:  All other systems negative, except as noted in the HPI. Review of Systems  Constitutional: Negative for chills and fever.  Cardiovascular: Negative for leg swelling.  Skin: Positive for wound. Negative for color change.    Objective: Vital Signs: Ht $RemoveB'5\' 4"'lMryFjZL$  (1.626 m)   Wt 168 lb (76.2 kg)   BMI 28.84 kg/m   Specialty Comments:  No specialty comments available.  PMFS History: Patient Active Problem List   Diagnosis Date Noted  . Diabetic ulcer of left foot ([place]) 07/17/2016  . Type II diabetes mellitus, uncontrolled ([place])   . Acute renal failure (ARF) ([place]) 05/10/2016  . SBO (small bowel obstruction) ([place]) 05/08/2016  . Diverticular disease of jejunum s/p resection 05/09/2016 05/08/2016  . UTI (urinary tract infection) 05/06/2016  . Dehydration   . Normocytic anemia 05/05/2016   Past Medical History:  Diagnosis  Date  . Diabetes mellitus     Family History  Problem Relation Age of Onset  . Diabetes Mother   . Asthma Sister     Past Surgical History:  Procedure Laterality Date  . INCISION AND DRAINAGE ABSCESS Left 02/11/2014   Procedure: INCISION AND DRAINAGE ABSCESS Left Buttock;  Surgeon: Jackolyn Confer, MD;  Location: [facility];  Service: General;  Laterality: Left;  . LAPAROSCOPIC SMALL BOWEL RESECTION N/A 05/09/2016   Procedure: LAPAROSCOPIC SMALL BOWEL RESECTION;  Surgeon: Michael Boston, MD;  Location: [facility];  Service: General;  Laterality: N/A;  . LAPAROSCOPY N/A 05/09/2016   Procedure: LAPAROSCOPY DIAGNOSTIC, LYSIS OF ADHESIONS, SMALL BOWEL RESECTION X 2;  Surgeon: Michael Boston, MD;  Location: [facility];  Service: General;  Laterality: N/A;  . TOE AMPUTATION     left foot great toe   Social History   Occupational History  . Not on file.   Social History Main Topics  . Smoking status: Never Smoker  . Smokeless tobacco: Never Used  . Alcohol use No  . Drug use: No  . Sexual activity: Not on file

## 2016-07-27 ENCOUNTER — Encounter (HOSPITAL_BASED_OUTPATIENT_CLINIC_OR_DEPARTMENT_OTHER): Payer: Medicare HMO

## 2016-07-28 DIAGNOSIS — D509 Iron deficiency anemia, unspecified: Secondary | ICD-10-CM | POA: Diagnosis not present

## 2016-07-29 ENCOUNTER — Ambulatory Visit (HOSPITAL_COMMUNITY): Payer: Medicare HMO

## 2016-07-30 ENCOUNTER — Ambulatory Visit (INDEPENDENT_AMBULATORY_CARE_PROVIDER_SITE_OTHER): Payer: Medicare HMO | Admitting: Family

## 2016-07-30 DIAGNOSIS — L0291 Cutaneous abscess, unspecified: Secondary | ICD-10-CM | POA: Diagnosis not present

## 2016-07-30 DIAGNOSIS — D649 Anemia, unspecified: Secondary | ICD-10-CM | POA: Diagnosis not present

## 2016-07-30 DIAGNOSIS — Z6824 Body mass index (BMI) 24.0-24.9, adult: Secondary | ICD-10-CM | POA: Diagnosis not present

## 2016-07-30 DIAGNOSIS — E1159 Type 2 diabetes mellitus with other circulatory complications: Secondary | ICD-10-CM | POA: Diagnosis not present

## 2016-07-31 NOTE — Addendum Note (Signed)
Addendum  created 07/31/16 1107 by Kalmen Lollar, MD   Sign clinical note    

## 2016-08-03 ENCOUNTER — Ambulatory Visit (HOSPITAL_COMMUNITY)
Admission: RE | Admit: 2016-08-03 | Discharge: 2016-08-03 | Disposition: A | Discharge status: Home / Self Care | Payer: Medicare HMO | Source: Ambulatory Visit | Attending: Family | Admitting: Family

## 2016-08-03 DIAGNOSIS — M6250 Muscle wasting and atrophy, not elsewhere classified, unspecified site: Secondary | ICD-10-CM | POA: Insufficient documentation

## 2016-08-03 DIAGNOSIS — E08621 Diabetes mellitus due to underlying condition with foot ulcer: Secondary | ICD-10-CM | POA: Insufficient documentation

## 2016-08-03 DIAGNOSIS — L97513 Non-pressure chronic ulcer of other part of right foot with necrosis of muscle: Secondary | ICD-10-CM | POA: Insufficient documentation

## 2016-08-03 DIAGNOSIS — M7989 Other specified soft tissue disorders: Secondary | ICD-10-CM | POA: Diagnosis not present

## 2016-08-10 ENCOUNTER — Telehealth (INDEPENDENT_AMBULATORY_CARE_PROVIDER_SITE_OTHER): Payer: Self-pay

## 2016-08-10 ENCOUNTER — Ambulatory Visit (INDEPENDENT_AMBULATORY_CARE_PROVIDER_SITE_OTHER): Payer: Medicare HMO | Admitting: Orthopedic Surgery

## 2016-08-10 NOTE — Telephone Encounter (Signed)
Patient has appointment scheduled for Wednesday 08/12/16 with Dr. Sharol Given.

## 2016-08-10 NOTE — Telephone Encounter (Signed)
-----   Message from Suzan Slick, NP sent at 08/05/2016  8:26 AM EDT ----- Regarding: mri review Will you schedule mri review with duda  ----- Message ----- From: Interface, Rad Results In Sent: 08/03/2016   7:51 PM To: Suzan Slick, NP

## 2016-08-11 DIAGNOSIS — H5203 Hypermetropia, bilateral: Secondary | ICD-10-CM | POA: Diagnosis not present

## 2016-08-11 DIAGNOSIS — E113413 Type 2 diabetes mellitus with severe nonproliferative diabetic retinopathy with macular edema, bilateral: Secondary | ICD-10-CM | POA: Diagnosis not present

## 2016-08-12 ENCOUNTER — Encounter (INDEPENDENT_AMBULATORY_CARE_PROVIDER_SITE_OTHER): Payer: Self-pay | Admitting: Orthopedic Surgery

## 2016-08-12 ENCOUNTER — Ambulatory Visit (INDEPENDENT_AMBULATORY_CARE_PROVIDER_SITE_OTHER): Payer: Medicare HMO | Admitting: Orthopedic Surgery

## 2016-08-12 VITALS — Ht 64.0 in | Wt 168.0 lb

## 2016-08-12 DIAGNOSIS — M86171 Other acute osteomyelitis, right ankle and foot: Secondary | ICD-10-CM

## 2016-08-12 DIAGNOSIS — E1142 Type 2 diabetes mellitus with diabetic polyneuropathy: Secondary | ICD-10-CM | POA: Diagnosis not present

## 2016-08-12 NOTE — Progress Notes (Signed)
Office Visit Note   Patient: Bailey [name]           Date of Birth: January 25, 1951           MRN: [id number] Visit Date: 08/12/2016              Requested by: [name], [address] [place], [place] [address] PCP: [name], MD  Chief Complaint  Patient presents with  . Right Foot - Follow-up    Review MRI right foot      HPI: Patient is a 66 year old woman diabetic insensate neuropathy who has had a ulcer beneath the right first metatarsal head she is status post a left first ray amputation. Patient complains of chronic ulceration and pain despite conservative therapy including a Darco shoe.  Assessment & Plan: Visit Diagnoses:  1. Acute osteomyelitis of toe, right ([name])   2. Diabetic polyneuropathy associated with type 2 diabetes mellitus ([name])     Plan: Patient has a Wagner grade 3 ulcer with osteomyelitis of the first metatarsal head. Discussed treatment options including nonoperative versus operative operative treatment. Patient states she would like to proceed with surgery we will set this up at her convenience. Anticipate surgery Friday afternoon.  Follow-Up Instructions: Return in about 2 weeks (around 08/26/2016).   Ortho Exam  Patient is alert, oriented, no adenopathy, well-dressed, normal affect, normal respiratory effort. Examination patient has a palpable pulse she does not have protective sensation there is no ascending cellulitis. After informed consent a 10 blade knife was used to debride the skin and soft tissue from beneath the ulcer first metatarsal head right foot. A silver nitrate stick a probe the wound and this probed all the way down to the sesamoids and the first metatarsal head the wound is approximately 5 mm in diameter and 1 cm deep. Review of the MRI scan shows osteomyelitis of the medial sesamoid as well as edema of the metatarsal head. There is no purulence no abscess.  Imaging: No results found.  Labs: Lab Results    Component Value Date   HGBA1C 10.2 (H) 05/06/2016   HGBA1C 12.9 (H) 02/11/2014   HGBA1C (H) 04/14/2010    12.6 (NOTE)                                                                       According to the ADA Clinical Practice Recommendations for 2011, when HbA1c is used as a screening test:   >=6.5%   Diagnostic of Diabetes Mellitus           (if abnormal result  is confirmed)  5.7-6.4%   Increased risk of developing Diabetes Mellitus  References:Diagnosis and Classification of Diabetes Mellitus,Diabetes DDUK,0254,27(CWCBJ 1):S62-S69 and Standards of Medical Care in         Diabetes - 2011,Diabetes Care,2011,34  (Suppl 1):S11-S61.   ESRSEDRATE 108 (H) 04/13/2010   REPTSTATUS 05/07/2016 FINAL 05/05/2016   GRAMSTAIN  02/11/2014    ABUNDANT WBC PRESENT,BOTH PMN AND MONONUCLEAR NO SQUAMOUS EPITHELIAL CELLS SEEN FEW GRAM POSITIVE COCCI IN PAIRS IN CLUSTERS Performed at [facility]  02/11/2014    ABUNDANT WBC PRESENT,BOTH PMN AND MONONUCLEAR NO SQUAMOUS EPITHELIAL CELLS SEEN FEW GRAM POSITIVE  COCCI IN PAIRS IN CLUSTERS Performed at [facility] (A) 05/05/2016   LABORGA STAPHYLOCOCCUS AUREUS 02/11/2014    Orders:  No orders of the defined types were placed in this encounter.  No orders of the defined types were placed in this encounter.    Procedures: No procedures performed  Clinical Data: No additional findings.  ROS:  All other systems negative, except as noted in the HPI. Review of Systems  Objective: Vital Signs: Ht $RemoveB'5\' 4"'QrXOTuOJ$  (1.626 m)   Wt 168 lb (76.2 kg)   BMI 28.84 kg/m   Specialty Comments:  No specialty comments available.  PMFS History: Patient Active Problem List   Diagnosis Date Noted  . Diabetic ulcer of left foot ([name]) 07/17/2016  . Type II diabetes mellitus, uncontrolled ([name])   . Acute renal failure (ARF) ([place]) 05/10/2016  . SBO (small bowel obstruction) ([place])  05/08/2016  . Diverticular disease of jejunum s/p resection 05/09/2016 05/08/2016  . UTI (urinary tract infection) 05/06/2016  . Dehydration   . Normocytic anemia 05/05/2016   Past Medical History:  Diagnosis Date  . Diabetes mellitus     Family History  Problem Relation Age of Onset  . Diabetes Mother   . Asthma Sister     Past Surgical History:  Procedure Laterality Date  . INCISION AND DRAINAGE ABSCESS Left 02/11/2014   Procedure: INCISION AND DRAINAGE ABSCESS Left Buttock;  Surgeon: Jackolyn Confer, MD;  [facility];  Service: General;  Laterality: Left;  . LAPAROSCOPIC SMALL BOWEL RESECTION N/A 05/09/2016   Procedure: LAPAROSCOPIC SMALL BOWEL RESECTION;  Surgeon: Michael Boston, MD;  [facility];  Service: General;  Laterality: N/A;  . LAPAROSCOPY N/A 05/09/2016   Procedure: LAPAROSCOPY DIAGNOSTIC, LYSIS OF ADHESIONS, SMALL BOWEL RESECTION X 2;  Surgeon: Michael Boston, MD;  [facility];  Service: General;  Laterality: N/A;  . TOE AMPUTATION     left foot great toe   Social History   Occupational History  . Not on file.   Social History Main Topics  . Smoking status: Never Smoker  . Smokeless tobacco: Never Used  . Alcohol use No  . Drug use: No  . Sexual activity: Not on file

## 2016-08-13 ENCOUNTER — Other Ambulatory Visit (INDEPENDENT_AMBULATORY_CARE_PROVIDER_SITE_OTHER): Payer: Self-pay | Admitting: Orthopedic Surgery

## 2016-08-13 ENCOUNTER — Encounter (HOSPITAL_COMMUNITY): Payer: Self-pay | Admitting: *Deleted

## 2016-08-13 DIAGNOSIS — M869 Osteomyelitis, unspecified: Secondary | ICD-10-CM

## 2016-08-13 NOTE — Progress Notes (Signed)
Bailey [name] is a Type II Dm, was started on Insulin the past 3 months.  Patient reports taht CBG fasting runs around 110.   Patient's PCP Dr Deirdre Evener is her PCP and manages her diabetes.  I instructed patient that if CBG is > 70I instructed patient to check CBG after awaking and every 2 hours until arrival  to the hospital.  I Instructed patient if CBG is less than 70 to take 4 Glucose Tablets or 1 tube of Glucose Gel or 1/2 cup of a clear juice. Recheck CBG in 15 minutes then call pre- op desk at [phone] for further instructions. If scheduled to receive Insulin, do not take Insulin to take 1/2 of Lantus dose- 7 units.

## 2016-08-14 ENCOUNTER — Ambulatory Visit (HOSPITAL_COMMUNITY)
Admission: RE | Admit: 2016-08-14 | Discharge: 2016-08-14 | Disposition: A | Discharge status: Home / Self Care | Payer: Medicare HMO | Source: Ambulatory Visit | Attending: Orthopedic Surgery | Admitting: Orthopedic Surgery

## 2016-08-14 ENCOUNTER — Encounter (HOSPITAL_COMMUNITY)
Admission: RE | Disposition: A | Discharge status: Home / Self Care | Payer: Self-pay | Source: Ambulatory Visit | Attending: Orthopedic Surgery

## 2016-08-14 ENCOUNTER — Ambulatory Visit (HOSPITAL_COMMUNITY): Payer: Medicare HMO | Admitting: Anesthesiology

## 2016-08-14 ENCOUNTER — Encounter (HOSPITAL_COMMUNITY): Payer: Self-pay | Admitting: General Practice

## 2016-08-14 DIAGNOSIS — D649 Anemia, unspecified: Secondary | ICD-10-CM | POA: Diagnosis not present

## 2016-08-14 DIAGNOSIS — E11319 Type 2 diabetes mellitus with unspecified diabetic retinopathy without macular edema: Secondary | ICD-10-CM | POA: Insufficient documentation

## 2016-08-14 DIAGNOSIS — Z89412 Acquired absence of left great toe: Secondary | ICD-10-CM | POA: Insufficient documentation

## 2016-08-14 DIAGNOSIS — M869 Osteomyelitis, unspecified: Secondary | ICD-10-CM

## 2016-08-14 DIAGNOSIS — E11628 Type 2 diabetes mellitus with other skin complications: Secondary | ICD-10-CM | POA: Diagnosis not present

## 2016-08-14 DIAGNOSIS — E1122 Type 2 diabetes mellitus with diabetic chronic kidney disease: Secondary | ICD-10-CM | POA: Diagnosis not present

## 2016-08-14 DIAGNOSIS — E1140 Type 2 diabetes mellitus with diabetic neuropathy, unspecified: Secondary | ICD-10-CM | POA: Insufficient documentation

## 2016-08-14 DIAGNOSIS — E1169 Type 2 diabetes mellitus with other specified complication: Secondary | ICD-10-CM | POA: Diagnosis not present

## 2016-08-14 DIAGNOSIS — E11621 Type 2 diabetes mellitus with foot ulcer: Secondary | ICD-10-CM | POA: Insufficient documentation

## 2016-08-14 DIAGNOSIS — L97529 Non-pressure chronic ulcer of other part of left foot with unspecified severity: Secondary | ICD-10-CM | POA: Insufficient documentation

## 2016-08-14 DIAGNOSIS — Z794 Long term (current) use of insulin: Secondary | ICD-10-CM | POA: Insufficient documentation

## 2016-08-14 DIAGNOSIS — Z79899 Other long term (current) drug therapy: Secondary | ICD-10-CM | POA: Diagnosis not present

## 2016-08-14 DIAGNOSIS — E1151 Type 2 diabetes mellitus with diabetic peripheral angiopathy without gangrene: Secondary | ICD-10-CM | POA: Diagnosis not present

## 2016-08-14 DIAGNOSIS — N189 Chronic kidney disease, unspecified: Secondary | ICD-10-CM | POA: Insufficient documentation

## 2016-08-14 DIAGNOSIS — N179 Acute kidney failure, unspecified: Secondary | ICD-10-CM | POA: Diagnosis not present

## 2016-08-14 HISTORY — DX: Diverticulitis of intestine, part unspecified, without perforation or abscess without bleeding: K57.92

## 2016-08-14 HISTORY — DX: Osteomyelitis, unspecified: M86.9

## 2016-08-14 HISTORY — PX: AMPUTATION: SHX166

## 2016-08-14 HISTORY — DX: Type 2 diabetes mellitus with unspecified diabetic retinopathy without macular edema: E11.319

## 2016-08-14 HISTORY — DX: Chronic kidney disease, unspecified: N18.9

## 2016-08-14 HISTORY — DX: Personal history of urinary calculi: Z87.442

## 2016-08-14 LAB — CBC
HCT: 32.7 % — ABNORMAL LOW (ref 36.0–46.0)
HEMOGLOBIN: 10.2 g/dL — AB (ref 12.0–15.0)
MCH: 27.3 pg (ref 26.0–34.0)
MCHC: 31.2 g/dL (ref 30.0–36.0)
MCV: 87.7 fL (ref 78.0–100.0)
PLATELETS: 167 10*3/uL (ref 150–400)
RBC: 3.73 MIL/uL — ABNORMAL LOW (ref 3.87–5.11)
RDW: 14.6 % (ref 11.5–15.5)
WBC: 9.2 10*3/uL (ref 4.0–10.5)

## 2016-08-14 LAB — COMPREHENSIVE METABOLIC PANEL
ALBUMIN: 3.5 g/dL (ref 3.5–5.0)
ALK PHOS: 75 U/L (ref 38–126)
ALT: 14 U/L (ref 14–54)
ANION GAP: 9 (ref 5–15)
AST: 20 U/L (ref 15–41)
BUN: 29 mg/dL — ABNORMAL HIGH (ref 6–20)
CALCIUM: 8.9 mg/dL (ref 8.9–10.3)
CHLORIDE: 111 mmol/L (ref 101–111)
CO2: 19 mmol/L — AB (ref 22–32)
Creatinine, Ser: 0.91 mg/dL (ref 0.44–1.00)
GFR calc non Af Amer: >60 mL/min (ref >60)
GLUCOSE: 92 mg/dL (ref 65–99)
POTASSIUM: 3.4 mmol/L — AB (ref 3.5–5.1)
SODIUM: 139 mmol/L (ref 135–145)
Total Bilirubin: 0.6 mg/dL (ref 0.3–1.2)
Total Protein: 7.1 g/dL (ref 6.5–8.1)

## 2016-08-14 LAB — GLUCOSE, CAPILLARY
GLUCOSE-CAPILLARY: 83 mg/dL (ref 65–99)
GLUCOSE-CAPILLARY: 91 mg/dL (ref 65–99)

## 2016-08-14 LAB — APTT: aPTT: 32 seconds (ref 24–36)

## 2016-08-14 LAB — PROTIME-INR
INR: 1.1
PROTHROMBIN TIME: 14.3 s (ref 11.4–15.2)

## 2016-08-14 SURGERY — AMPUTATION, FOOT, RAY
Anesthesia: Monitor Anesthesia Care | Laterality: Right

## 2016-08-14 MED ORDER — CEFAZOLIN SODIUM-DEXTROSE 2-4 GM/100ML-% IV SOLN
2.0000 g | INTRAVENOUS | Status: AC
  Administered 2016-08-14: 2 g via INTRAVENOUS

## 2016-08-14 MED ORDER — CHLORHEXIDINE GLUCONATE 4 % EX LIQD: 60.0000 mL | Freq: Once | CUTANEOUS | Status: DC

## 2016-08-14 MED ORDER — MIDAZOLAM HCL 5 MG/5ML IJ SOLN
INTRAMUSCULAR | Status: DC | PRN
  Administered 2016-08-14: 2 mg via INTRAVENOUS

## 2016-08-14 MED ORDER — FENTANYL CITRATE (PF) 100 MCG/2ML IJ SOLN
50.0000 ug | Freq: Once | INTRAMUSCULAR | Status: AC
  Administered 2016-08-14: 50 ug via INTRAVENOUS

## 2016-08-14 MED ORDER — MIDAZOLAM HCL 2 MG/2ML IJ SOLN
INTRAMUSCULAR | Status: AC
  Filled 2016-08-14: qty 2

## 2016-08-14 MED ORDER — ONDANSETRON HCL 4 MG/2ML IJ SOLN
INTRAMUSCULAR | Status: AC
  Filled 2016-08-14: qty 2

## 2016-08-14 MED ORDER — BUPIVACAINE-EPINEPHRINE (PF) 0.5% -1:200000 IJ SOLN
INTRAMUSCULAR | Status: DC | PRN
  Administered 2016-08-14: 25 mL via PERINEURAL

## 2016-08-14 MED ORDER — PROPOFOL 10 MG/ML IV BOLUS
INTRAVENOUS | Status: DC | PRN
  Administered 2016-08-14 (×2): 30 mg via INTRAVENOUS
  Administered 2016-08-14: 50 mg via INTRAVENOUS
  Administered 2016-08-14 (×2): 30 mg via INTRAVENOUS
  Administered 2016-08-14: 20 mg via INTRAVENOUS

## 2016-08-14 MED ORDER — FENTANYL CITRATE (PF) 250 MCG/5ML IJ SOLN
INTRAMUSCULAR | Status: AC
  Filled 2016-08-14: qty 5

## 2016-08-14 MED ORDER — LACTATED RINGERS IV SOLN
INTRAVENOUS | Status: DC
  Administered 2016-08-14: 12:00:00 via INTRAVENOUS

## 2016-08-14 MED ORDER — PROPOFOL 10 MG/ML IV BOLUS
INTRAVENOUS | Status: AC
  Filled 2016-08-14: qty 20

## 2016-08-14 MED ORDER — CEFAZOLIN SODIUM-DEXTROSE 2-4 GM/100ML-% IV SOLN
INTRAVENOUS | Status: AC
  Filled 2016-08-14: qty 100

## 2016-08-14 MED ORDER — HYDROCODONE-ACETAMINOPHEN 5-325 MG PO TABS: 1.0000 | ORAL_TABLET | ORAL | 0 refills | Status: DC | PRN

## 2016-08-14 MED ORDER — HYDROMORPHONE HCL 1 MG/ML IJ SOLN: 0.2500 mg | INTRAMUSCULAR | Status: DC | PRN

## 2016-08-14 MED ORDER — ONDANSETRON HCL 4 MG/2ML IJ SOLN
INTRAMUSCULAR | Status: DC | PRN
  Administered 2016-08-14: 4 mg via INTRAVENOUS

## 2016-08-14 MED ORDER — MIDAZOLAM HCL 2 MG/2ML IJ SOLN
1.0000 mg | Freq: Once | INTRAMUSCULAR | Status: AC
  Administered 2016-08-14: 1 mg via INTRAVENOUS

## 2016-08-14 MED ORDER — 0.9 % SODIUM CHLORIDE (POUR BTL) OPTIME
TOPICAL | Status: DC | PRN
  Administered 2016-08-14: 1000 mL

## 2016-08-14 SURGICAL SUPPLY — 31 items
BLADE SAW SGTL MED 73X18.5 STR (BLADE) ×3 IMPLANT
BLADE SURG 21 STRL SS (BLADE) ×3 IMPLANT
BNDG COHESIVE 4X5 TAN STRL (GAUZE/BANDAGES/DRESSINGS) ×3 IMPLANT
BNDG GAUZE ELAST 4 BULKY (GAUZE/BANDAGES/DRESSINGS) ×6 IMPLANT
COVER SURGICAL LIGHT HANDLE (MISCELLANEOUS) ×6 IMPLANT
DRAPE U-SHAPE 47X51 STRL (DRAPES) ×6 IMPLANT
DRSG ADAPTIC 3X8 NADH LF (GAUZE/BANDAGES/DRESSINGS) ×3 IMPLANT
DRSG PAD ABDOMINAL 8X10 ST (GAUZE/BANDAGES/DRESSINGS) ×6 IMPLANT
DURAPREP 26ML APPLICATOR (WOUND CARE) ×3 IMPLANT
ELECT REM PT RETURN 9FT ADLT (ELECTROSURGICAL) ×3
ELECTRODE REM PT RTRN 9FT ADLT (ELECTROSURGICAL) ×1 IMPLANT
GAUZE SPONGE 4X4 12PLY STRL (GAUZE/BANDAGES/DRESSINGS) ×3 IMPLANT
GLOVE BIOGEL PI IND STRL 6.5 (GLOVE) ×1 IMPLANT
GLOVE BIOGEL PI IND STRL 7.0 (GLOVE) ×1 IMPLANT
GLOVE BIOGEL PI IND STRL 9 (GLOVE) ×1 IMPLANT
GLOVE BIOGEL PI INDICATOR 6.5 (GLOVE) ×2
GLOVE BIOGEL PI INDICATOR 7.0 (GLOVE) ×2
GLOVE BIOGEL PI INDICATOR 9 (GLOVE) ×2
GLOVE SURG ORTHO 9.0 STRL STRW (GLOVE) ×3 IMPLANT
GLOVE SURG SS PI 6.5 STRL IVOR (GLOVE) ×3 IMPLANT
GLOVE SURG SS PI 7.0 STRL IVOR (GLOVE) ×3 IMPLANT
GOWN STRL REUS W/ TWL XL LVL3 (GOWN DISPOSABLE) ×2 IMPLANT
GOWN STRL REUS W/TWL XL LVL3 (GOWN DISPOSABLE) ×4
KIT BASIN OR (CUSTOM PROCEDURE TRAY) ×3 IMPLANT
KIT ROOM TURNOVER OR (KITS) ×3 IMPLANT
NS IRRIG 1000ML POUR BTL (IV SOLUTION) ×3 IMPLANT
PACK ORTHO EXTREMITY (CUSTOM PROCEDURE TRAY) ×3 IMPLANT
PAD ARMBOARD 7.5X6 YLW CONV (MISCELLANEOUS) ×6 IMPLANT
STOCKINETTE IMPERVIOUS LG (DRAPES) IMPLANT
SUT ETHILON 2 0 PSLX (SUTURE) ×6 IMPLANT
TOWEL OR 17X26 10 PK STRL BLUE (TOWEL DISPOSABLE) ×3 IMPLANT

## 2016-08-14 NOTE — Transfer of Care (Signed)
Immediate Anesthesia Transfer of Care Note  Patient: Bailey [name]  Procedure(s) Performed: Procedure(s): RIGHT 1ST RAY AMPUTATION MID-SHAFT (Right)  Patient Location: PACU  Anesthesia Type:MAC  Level of Consciousness: drowsy and patient cooperative  Airway & Oxygen Therapy: Patient Spontanous Breathing and Patient connected to face mask oxygen  Post-op Assessment: Report given to RN and Post -op Vital signs reviewed and stable  Post vital signs: Reviewed and stable  Last Vitals:  Vitals:   08/14/16 1255 08/14/16 1354  BP: (!) 155/53   Pulse: 77   Resp: 16   Temp:  36.7 C    Last Pain:  Vitals:   08/14/16 1136  TempSrc: Oral      Patients Stated Pain Goal: 2 (67/12/45 8099)  Complications: No apparent anesthesia complications

## 2016-08-14 NOTE — H&P (Signed)
Bailey [name] is an 66 y.o. female.   Chief Complaint: Osteomyelitis right foot great toe MTP joint HPI: Patient is a 66 year old woman type II diabetic peripheral vascular disease status post left foot first ray amputation and presents with chronic ulceration and osteomyelitis great toe MTP joint.  Past Medical History:  Diagnosis Date  . Chronic kidney disease 05/11/2016   ARF-- dehydration  resolved by  discharged  . Diabetes mellitus    Type II  . Diabetic retinopathy ([place])   . Diverticulitis   . History of kidney stones    passed- 6  . Osteomyelitis [facility])     Past Surgical History:  Procedure Laterality Date  . INCISION AND DRAINAGE ABSCESS Left 02/11/2014   Procedure: INCISION AND DRAINAGE ABSCESS Left Buttock;  Surgeon: Jackolyn Confer, MD;  [facility];  Service: General;  Laterality: Left;  . LAPAROSCOPIC SMALL BOWEL RESECTION N/A 05/09/2016   Procedure: LAPAROSCOPIC SMALL BOWEL RESECTION;  Surgeon: Michael Boston, MD;  [facility];  Service: General;  Laterality: N/A;  . LAPAROSCOPY N/A 05/09/2016   Procedure: LAPAROSCOPY DIAGNOSTIC, LYSIS OF ADHESIONS, SMALL BOWEL RESECTION X 2;  Surgeon: Michael Boston, MD;  [facility];  Service: General;  Laterality: N/A;  . TOE AMPUTATION     left foot great toe    Family History  Problem Relation Age of Onset  . Diabetes Mother   . Asthma Sister    Social History:  reports that she has never smoked. She has never used smokeless tobacco. She reports that she does not drink alcohol or use drugs.  Allergies:  Allergies  Allergen Reactions  . No Known Allergies     No prescriptions prior to admission.    No results found for this or any previous visit (from the past 48 hour(s)). No results found.  Review of Systems  All other systems reviewed and are negative.   There were no vitals taken for this visit. Physical Exam  On examination patient is alert oriented no adenopathy well-dressed normal affect normal  respiratory effort she does have an antalgic gait. Patient has a good dorsalis pedis pulse. She has an ulcer beneath the great toe MTP joint which probes down to bone. MRI scan shows osteomyelitis of the medial sesamoid. Assessment/Plan Assessment: Diabetic insensate neuropathy with osteomyelitis right great toe first metatarsal head.  Plan: We'll plan for first ray amputation. Risk and benefits were discussed including persistent infection nonhealing the wound need for additional surgery. Patient states she understands and wishes to proceed.  Newt Minion, MD 08/14/2016, 6:29 AM

## 2016-08-14 NOTE — Op Note (Signed)
08/14/2016  1:50 PM  PATIENT:  [name]    PRE-OPERATIVE DIAGNOSIS:  Osteomyelitis Right Great Toe MTP  POST-OPERATIVE DIAGNOSIS:  Same  PROCEDURE:  RIGHT 1ST RAY AMPUTATION   SURGEON:  Newt Minion, MD  PHYSICIAN ASSISTANT:None ANESTHESIA:   General  PREOPERATIVE INDICATIONS:  KHAMIYA VARIN is a  66 y.o. female with a diagnosis of Osteomyelitis Right Great Toe MTP who failed conservative measures and elected for surgical management.    The risks benefits and alternatives were discussed with the patient preoperatively including but not limited to the risks of infection, bleeding, nerve injury, cardiopulmonary complications, the need for revision surgery, among others, and the patient was willing to proceed.  OPERATIVE IMPLANTS: None  OPERATIVE FINDINGS: Good petechial bleeding  OPERATIVE PROCEDURE: Patient brought the operating room after undergoing a popliteal block. After adequate levels anesthesia were obtained patient's right lower extremity was prepped using DuraPrep draped into a sterile field a timeout was called. A racquet incision was made around the ulcerative tissue beneath the first metatarsal head extending up to the base of the first metatarsal. The first ray was resected in 1 block of tissue. Electrocautery was used for hemostasis. The wound was irrigated with normal saline. There is no signs of abscess or infection the level of amputation. The incision was closed using 2-0 nylon. A sterile compressive dressing was applied patient was taken to the PACU in stable condition.

## 2016-08-14 NOTE — Anesthesia Procedure Notes (Signed)
Anesthesia Regional Block: Popliteal block   Pre-Anesthetic Checklist: ,, timeout performed, Correct Patient, Correct Site, Correct Laterality, Correct Procedure, Correct Position, site marked, Risks and benefits discussed,  Surgical consent,  Pre-op evaluation,  At surgeon's request and post-op pain management  Laterality: Right  Prep: chloraprep       Needles:  Injection technique: Single-shot  Needle Type: Echogenic Needle     Needle Length: 9cm  Needle Gauge: 21     Additional Needles:   Procedures: ultrasound guided,,,,,,,,  Narrative:  Start time: 08/14/2016 12:41 PM End time: 08/14/2016 12:48 PM Injection made incrementally with aspirations every 5 mL.  Performed by: Personally  Anesthesiologist: Suzette Battiest

## 2016-08-14 NOTE — Progress Notes (Signed)
Versed '1mg'$  and Fentanyl 28mcg wasted in sink witness Norville Haggard, Rn

## 2016-08-14 NOTE — Anesthesia Postprocedure Evaluation (Signed)
Anesthesia Post Note  Patient: Bailey [name]  Procedure(s) Performed: Procedure(s) (LRB): RIGHT 1ST RAY AMPUTATION MID-SHAFT (Right)     Patient location during evaluation: PACU Anesthesia Type: Regional Level of consciousness: awake and alert Pain management: pain level controlled Vital Signs Assessment: post-procedure vital signs reviewed and stable Respiratory status: spontaneous breathing, nonlabored ventilation, respiratory function stable and patient connected to nasal cannula oxygen Cardiovascular status: stable and blood pressure returned to baseline Anesthetic complications: no    Last Vitals:  Vitals:   08/14/16 1410 08/14/16 1425  BP: 136/66 133/60  Pulse: 72 72  Resp: 14 19  Temp:  36.6 C    Last Pain:  Vitals:   08/14/16 1136  TempSrc: Oral                 Tiajuana Amass

## 2016-08-14 NOTE — Anesthesia Preprocedure Evaluation (Signed)
Anesthesia Evaluation  Patient identified by MRN, date of birth, ID band Patient awake    Reviewed: Allergy & Precautions, NPO status , Patient's Chart, lab work & pertinent test results  Airway Mallampati: II  TM Distance: >3 FB Neck ROM: Full    Dental  (+) Upper Dentures   Pulmonary neg pulmonary ROS,    breath sounds clear to auscultation       Cardiovascular negative cardio ROS   Rhythm:Regular Rate:Normal     Neuro/Psych negative neurological ROS     GI/Hepatic negative GI ROS, Neg liver ROS,   Endo/Other  diabetes, Type 2, Insulin Dependent  Renal/GU Renal disease     Musculoskeletal   Abdominal   Peds  Hematology  (+) anemia ,   Anesthesia Other Findings   Reproductive/Obstetrics                             Lab Results  Component Value Date   WBC 9.2 08/14/2016   HGB 10.2 (L) 08/14/2016   HCT 32.7 (L) 08/14/2016   MCV 87.7 08/14/2016   PLT 167 08/14/2016   Lab Results  Component Value Date   CREATININE 0.91 08/14/2016   BUN 29 (H) 08/14/2016   NA 139 08/14/2016   K 3.4 (L) 08/14/2016   CL 111 08/14/2016   CO2 19 (L) 08/14/2016    Anesthesia Physical Anesthesia Plan  ASA: III  Anesthesia Plan: MAC and Regional   Post-op Pain Management:    Induction: Intravenous  PONV Risk Score and Plan: 2 and Ondansetron and Dexamethasone  Airway Management Planned:   Additional Equipment:   Intra-op Plan:   Post-operative Plan:   Informed Consent: I have reviewed the patients History and Physical, chart, labs and discussed the procedure including the risks, benefits and alternatives for the proposed anesthesia with the patient or authorized representative who has indicated his/her understanding and acceptance.     Plan Discussed with: CRNA  Anesthesia Plan Comments:         Anesthesia Quick Evaluation

## 2016-08-14 NOTE — Progress Notes (Signed)
Orthopedic Tech Progress Note Patient Details:  Bailey [name] [birth year] [id number]  Ortho Devices Type of Ortho Device: Postop shoe/boot Ortho Device/Splint Interventions: Application   [place] Pink 08/14/2016, 2:34 PM

## 2016-08-15 ENCOUNTER — Encounter (HOSPITAL_COMMUNITY): Payer: Self-pay | Admitting: Orthopedic Surgery

## 2016-08-19 ENCOUNTER — Ambulatory Visit (INDEPENDENT_AMBULATORY_CARE_PROVIDER_SITE_OTHER): Payer: Medicare HMO | Admitting: Orthopedic Surgery

## 2016-08-19 ENCOUNTER — Encounter (INDEPENDENT_AMBULATORY_CARE_PROVIDER_SITE_OTHER): Payer: Self-pay | Admitting: Orthopedic Surgery

## 2016-08-19 VITALS — Ht 64.0 in | Wt 168.0 lb

## 2016-08-19 DIAGNOSIS — IMO0002 Reserved for concepts with insufficient information to code with codable children: Secondary | ICD-10-CM

## 2016-08-19 DIAGNOSIS — Z89411 Acquired absence of right great toe: Secondary | ICD-10-CM

## 2016-08-19 NOTE — Progress Notes (Signed)
   Post-Op Visit Note   Patient: Bailey [name]           Date of Birth: 1951-02-09           MRN: [id number] Visit Date: 08/19/2016 PCP: [name], MD  Chief Complaint:  Chief Complaint  Patient presents with  . Right Foot - Routine Post Op    08/14/16 right first ray amputation     HPI:  The patient is a 66 year old woman who presents today one week status post right first ray amputation. She's been nonweightbearing in her postop shoe. States she does elevated to the level per hardware throughout the day. No complaints of pain.    Ortho Exam Incision is well proximate with sutures. There is moderate swelling there is him minimal bloody drainage there is no gaping no straining erythema odor or sign of infection.  Visit Diagnoses:  1. Great toe amputation status, right ([place])     Plan: Continue elevating. Begin daily Dial soap cleansing. Apply dry dressing. Follow up in office in 2 weeks for suture removal.  Follow-Up Instructions: Return in about 2 weeks (around 09/02/2016).   Imaging: No results found.  Orders:  No orders of the defined types were placed in this encounter.  No orders of the defined types were placed in this encounter.    PMFS History: Patient Active Problem List   Diagnosis Date Noted  . Osteomyelitis of toe of right foot ([place])   . Diabetic ulcer of left foot ([place]) 07/17/2016  . Type II diabetes mellitus, uncontrolled ([place])   . Acute renal failure (ARF) ([place]) 05/10/2016  . SBO (small bowel obstruction) ([place]) 05/08/2016  . Diverticular disease of jejunum s/p resection 05/09/2016 05/08/2016  . UTI (urinary tract infection) 05/06/2016  . Dehydration   . Normocytic anemia 05/05/2016   Past Medical History:  Diagnosis Date  . Chronic kidney disease 05/11/2016   ARF-- dehydration  resolved by  discharged  . Diabetes mellitus    Type II  . Diabetic retinopathy ([place])   . Diverticulitis   . History of kidney stones    passed- 6  .  Osteomyelitis ([place])     Family History  Problem Relation Age of Onset  . Diabetes Mother   . Asthma Sister     Past Surgical History:  Procedure Laterality Date  . AMPUTATION Right 08/14/2016   Procedure: RIGHT 1ST RAY AMPUTATION MID-SHAFT;  Surgeon: Newt Minion, MD;  Location: [facility];  Service: Orthopedics;  Laterality: Right;  . INCISION AND DRAINAGE ABSCESS Left 02/11/2014   Procedure: INCISION AND DRAINAGE ABSCESS Left Buttock;  Surgeon: Jackolyn Confer, MD;  [facility];  Service: General;  Laterality: Left;  . LAPAROSCOPIC SMALL BOWEL RESECTION N/A 05/09/2016   Procedure: LAPAROSCOPIC SMALL BOWEL RESECTION;  Surgeon: Michael Boston, MD;  [facility];  Service: General;  Laterality: N/A;  . LAPAROSCOPY N/A 05/09/2016   Procedure: LAPAROSCOPY DIAGNOSTIC, LYSIS OF ADHESIONS, SMALL BOWEL RESECTION X 2;  Surgeon: Michael Boston, MD;  [facility];  Service: General;  Laterality: N/A;  . TOE AMPUTATION     left foot great toe   Social History   Occupational History  . Not on file.   Social History Main Topics  . Smoking status: Never Smoker  . Smokeless tobacco: Never Used  . Alcohol use No  . Drug use: No  . Sexual activity: Not on file

## 2016-08-24 ENCOUNTER — Inpatient Hospital Stay (INDEPENDENT_AMBULATORY_CARE_PROVIDER_SITE_OTHER): Payer: Medicare HMO | Admitting: Orthopedic Surgery

## 2016-08-25 DIAGNOSIS — E113411 Type 2 diabetes mellitus with severe nonproliferative diabetic retinopathy with macular edema, right eye: Secondary | ICD-10-CM | POA: Diagnosis not present

## 2016-09-02 ENCOUNTER — Ambulatory Visit (INDEPENDENT_AMBULATORY_CARE_PROVIDER_SITE_OTHER): Payer: Medicare HMO | Admitting: Family

## 2016-09-02 ENCOUNTER — Encounter (INDEPENDENT_AMBULATORY_CARE_PROVIDER_SITE_OTHER): Payer: Self-pay | Admitting: Family

## 2016-09-02 VITALS — Ht 64.0 in | Wt 168.0 lb

## 2016-09-02 DIAGNOSIS — E1165 Type 2 diabetes mellitus with hyperglycemia: Secondary | ICD-10-CM

## 2016-09-02 DIAGNOSIS — IMO0002 Reserved for concepts with insufficient information to code with codable children: Secondary | ICD-10-CM

## 2016-09-02 DIAGNOSIS — Z89411 Acquired absence of right great toe: Secondary | ICD-10-CM

## 2016-09-02 DIAGNOSIS — IMO0001 Reserved for inherently not codable concepts without codable children: Secondary | ICD-10-CM

## 2016-09-04 DIAGNOSIS — IMO0002 Reserved for concepts with insufficient information to code with codable children: Secondary | ICD-10-CM | POA: Insufficient documentation

## 2016-09-04 NOTE — Progress Notes (Signed)
Post-Op Visit Note   Patient: Bailey [name]           Date of Birth: [birth year]           MRN: [id number] Visit Date: 09/02/2016 PCP: [name], MD  Chief Complaint:  Chief Complaint  Patient presents with  . Right Foot - Routine Post Op    08/14/16 right first ray amputation     HPI:  The patient is a 66 year old woman who presents today status post right first ray amputation. She's been nonweightbearing in her postop shoe. States she does elevate to the level of her heart throughout the day. No complaints of pain.    Ortho Exam The incision is healing well overall. However distally there is a small open area this is gaped about 3 mm wide a length of 5 mm there is maceration and a depth of 2 mm there is no drainage no surrounding erythema no cellulitis no odor no sign of infection.  Visit Diagnoses:  No diagnosis found.  Plan: Continue elevating. Begin daily Dial soap cleansing. Apply dry dressing. Sutures harvested today without incident. May continue weightbearing through the heel touchdown weightbearing. Follow-up in office in 2 more weeks.   Follow-Up Instructions: No Follow-up on file.   Imaging: No results found.  Orders:  No orders of the defined types were placed in this encounter.  No orders of the defined types were placed in this encounter.    PMFS History: Patient Active Problem List   Diagnosis Date Noted  . Osteomyelitis of toe of right foot ([place])   . Diabetic ulcer of left foot ([place]) 07/17/2016  . Type II diabetes mellitus, uncontrolled ([place])   . Acute renal failure (ARF) ([place]) 05/10/2016  . SBO (small bowel obstruction) ([place]) 05/08/2016  . Diverticular disease of jejunum s/p resection 05/09/2016 05/08/2016  . UTI (urinary tract infection) 05/06/2016  . Dehydration   . Normocytic anemia 05/05/2016   Past Medical History:  Diagnosis Date  . Chronic kidney disease 05/11/2016   ARF-- dehydration  resolved by  discharged  . Diabetes  mellitus    Type II  . Diabetic retinopathy ([place])   . Diverticulitis   . History of kidney stones    passed- 6  . Osteomyelitis ([place])     Family History  Problem Relation Age of Onset  . Diabetes Mother   . Asthma Sister     Past Surgical History:  Procedure Laterality Date  . AMPUTATION Right 08/14/2016   Procedure: RIGHT 1ST RAY AMPUTATION MID-SHAFT;  Surgeon: Newt Minion, MD;  Location: [facility];  Service: Orthopedics;  Laterality: Right;  . INCISION AND DRAINAGE ABSCESS Left 02/11/2014   Procedure: INCISION AND DRAINAGE ABSCESS Left Buttock;  Surgeon: Jackolyn Confer, MD;  [facility];  Service: General;  Laterality: Left;  . LAPAROSCOPIC SMALL BOWEL RESECTION N/A 05/09/2016   Procedure: LAPAROSCOPIC SMALL BOWEL RESECTION;  Surgeon: Michael Boston, MD;  [facility];  Service: General;  Laterality: N/A;  . LAPAROSCOPY N/A 05/09/2016   Procedure: LAPAROSCOPY DIAGNOSTIC, LYSIS OF ADHESIONS, SMALL BOWEL RESECTION X 2;  Surgeon: Michael Boston, MD;  [facility];  Service: General;  Laterality: N/A;  . TOE AMPUTATION     left foot great toe   Social History   Occupational History  . Not on file.   Social History Main Topics  . Smoking status: Never Smoker  . Smokeless tobacco: Never Used  . Alcohol use No  . Drug use: No  .  Sexual activity: Not on file

## 2016-09-08 DIAGNOSIS — E113412 Type 2 diabetes mellitus with severe nonproliferative diabetic retinopathy with macular edema, left eye: Secondary | ICD-10-CM | POA: Diagnosis not present

## 2016-09-14 DIAGNOSIS — E119 Type 2 diabetes mellitus without complications: Secondary | ICD-10-CM | POA: Diagnosis not present

## 2016-09-18 ENCOUNTER — Ambulatory Visit (INDEPENDENT_AMBULATORY_CARE_PROVIDER_SITE_OTHER): Payer: Medicare HMO | Admitting: Family

## 2016-09-18 DIAGNOSIS — E1165 Type 2 diabetes mellitus with hyperglycemia: Secondary | ICD-10-CM

## 2016-09-18 DIAGNOSIS — IMO0001 Reserved for inherently not codable concepts without codable children: Secondary | ICD-10-CM

## 2016-09-18 DIAGNOSIS — IMO0002 Reserved for concepts with insufficient information to code with codable children: Secondary | ICD-10-CM

## 2016-09-18 DIAGNOSIS — Z89411 Acquired absence of right great toe: Secondary | ICD-10-CM

## 2016-09-21 DIAGNOSIS — M869 Osteomyelitis, unspecified: Secondary | ICD-10-CM | POA: Diagnosis not present

## 2016-09-21 DIAGNOSIS — E1159 Type 2 diabetes mellitus with other circulatory complications: Secondary | ICD-10-CM | POA: Diagnosis not present

## 2016-09-21 DIAGNOSIS — E11319 Type 2 diabetes mellitus with unspecified diabetic retinopathy without macular edema: Secondary | ICD-10-CM | POA: Diagnosis not present

## 2016-09-21 DIAGNOSIS — E1142 Type 2 diabetes mellitus with diabetic polyneuropathy: Secondary | ICD-10-CM | POA: Diagnosis not present

## 2016-09-21 NOTE — Progress Notes (Signed)
   Post-Op Visit Note   Patient: Bailey [name]           Date of Birth: 01/20/1951           MRN: [id number] Visit Date: 09/18/2016 PCP: [name], MD  Chief Complaint:  No chief complaint on file.   HPI:  The patient is a 66 year old woman who presents today status post right first ray amputation. She's been full weightbearing in her postop shoe. Continued swelling. Feels the compression stocking worsens her swelling.     Ortho Exam The incision is healing well overall. However distally there is a small open area this is gaped about 3 mm in diameter open area. No depth. there is no drainage no surrounding erythema no cellulitis no odor no sign of infection.  Visit Diagnoses:  1. Great toe amputation status, right ([place])   2. Uncontrolled type 2 diabetes mellitus without complication, without long-term current use of insulin ([place])     Plan: continue daily Dial soap cleansing. Apply dry dressing. Advance weight bearing as tolerated.   Follow-Up Instructions: Return in about 4 weeks (around 10/16/2016).   Imaging: No results found.  Orders:  No orders of the defined types were placed in this encounter.  No orders of the defined types were placed in this encounter.    PMFS History: Patient Active Problem List   Diagnosis Date Noted  . Great toe amputation status, right ([place]) 09/04/2016  . Osteomyelitis of toe of right foot ([place])   . Type II diabetes mellitus, uncontrolled ([place])   . Acute renal failure (ARF) ([place]) 05/10/2016  . SBO (small bowel obstruction) ([place]) 05/08/2016  . Diverticular disease of jejunum s/p resection 05/09/2016 05/08/2016  . UTI (urinary tract infection) 05/06/2016  . Dehydration   . Normocytic anemia 05/05/2016   Past Medical History:  Diagnosis Date  . Chronic kidney disease 05/11/2016   ARF-- dehydration  resolved by  discharged  . Diabetes mellitus    Type II  . Diabetic retinopathy ([place])   . Diverticulitis   . History of kidney  stones    passed- 4  . Osteomyelitis ([place])     Family History  Problem Relation Age of Onset  . Diabetes Mother   . Asthma Sister     Past Surgical History:  Procedure Laterality Date  . AMPUTATION Right 08/14/2016   Procedure: RIGHT 1ST RAY AMPUTATION MID-SHAFT;  Surgeon: Newt Minion, MD;  Location: [facility];  Service: Orthopedics;  Laterality: Right;  . INCISION AND DRAINAGE ABSCESS Left 02/11/2014   Procedure: INCISION AND DRAINAGE ABSCESS Left Buttock;  Surgeon: Jackolyn Confer, MD;  Location: [facility];  Service: General;  Laterality: Left;  . LAPAROSCOPIC SMALL BOWEL RESECTION N/A 05/09/2016   Procedure: LAPAROSCOPIC SMALL BOWEL RESECTION;  Surgeon: Michael Boston, MD;  Location: [facility];  Service: General;  Laterality: N/A;  . LAPAROSCOPY N/A 05/09/2016   Procedure: LAPAROSCOPY DIAGNOSTIC, LYSIS OF ADHESIONS, SMALL BOWEL RESECTION X 2;  Surgeon: Michael Boston, MD;  Location: [facility];  Service: General;  Laterality: N/A;  . TOE AMPUTATION     left foot great toe   Social History   Occupational History  . Not on file.   Social History Main Topics  . Smoking status: Never Smoker  . Smokeless tobacco: Never Used  . Alcohol use No  . Drug use: No  . Sexual activity: Not on file

## 2016-09-22 DIAGNOSIS — E113411 Type 2 diabetes mellitus with severe nonproliferative diabetic retinopathy with macular edema, right eye: Secondary | ICD-10-CM | POA: Diagnosis not present

## 2016-09-22 DIAGNOSIS — E113412 Type 2 diabetes mellitus with severe nonproliferative diabetic retinopathy with macular edema, left eye: Secondary | ICD-10-CM | POA: Diagnosis not present

## 2016-10-06 DIAGNOSIS — H2513 Age-related nuclear cataract, bilateral: Secondary | ICD-10-CM | POA: Diagnosis not present

## 2016-10-06 DIAGNOSIS — H35352 Cystoid macular degeneration, left eye: Secondary | ICD-10-CM | POA: Diagnosis not present

## 2016-10-06 DIAGNOSIS — H35351 Cystoid macular degeneration, right eye: Secondary | ICD-10-CM | POA: Diagnosis not present

## 2016-10-12 DIAGNOSIS — I10 Essential (primary) hypertension: Secondary | ICD-10-CM | POA: Diagnosis not present

## 2016-10-12 DIAGNOSIS — Z6823 Body mass index (BMI) 23.0-23.9, adult: Secondary | ICD-10-CM | POA: Diagnosis not present

## 2016-10-12 DIAGNOSIS — E1142 Type 2 diabetes mellitus with diabetic polyneuropathy: Secondary | ICD-10-CM | POA: Diagnosis not present

## 2016-10-12 DIAGNOSIS — E663 Overweight: Secondary | ICD-10-CM | POA: Diagnosis not present

## 2016-10-16 ENCOUNTER — Ambulatory Visit (INDEPENDENT_AMBULATORY_CARE_PROVIDER_SITE_OTHER): Payer: Medicare HMO | Admitting: Family

## 2016-10-16 DIAGNOSIS — Z89411 Acquired absence of right great toe: Secondary | ICD-10-CM

## 2016-10-16 DIAGNOSIS — IMO0002 Reserved for concepts with insufficient information to code with codable children: Secondary | ICD-10-CM

## 2016-10-16 NOTE — Progress Notes (Signed)
   Post-Op Visit Note   Patient: Bailey [name]           Date of Birth: Dec 22, 1950           MRN: [id number] Visit Date: 10/16/2016 PCP: [name], MD  Chief Complaint:  No chief complaint on file.   HPI:  The patient is a 66 year old woman who presents today status post right first ray amputation about 2 months ago. She's been full weightbearing in regular shoewear. Doing ADLs and IADLs without concern. Has continued swelling to RLE.    Ortho Exam Does have pitting edema to RLE. No erythema or weeping. The incision is well healed. No drainage no surrounding erythema no cellulitis no odor no sign of infection.  Visit Diagnoses:  1. Great toe amputation status, right ([place])     Plan: activities as tolerated. Follow up in office as needed.   Follow-Up Instructions: Return if symptoms worsen or fail to improve.   Imaging: No results found.  Orders:  No orders of the defined types were placed in this encounter.  No orders of the defined types were placed in this encounter.    PMFS History: Patient Active Problem List   Diagnosis Date Noted  . Great toe amputation status, right ([place]) 09/04/2016  . Osteomyelitis of toe of right foot ([place])   . Type II diabetes mellitus, uncontrolled ([place])   . Acute renal failure (ARF) ([place]) 05/10/2016  . SBO (small bowel obstruction) ([place]) 05/08/2016  . Diverticular disease of jejunum s/p resection 05/09/2016 05/08/2016  . UTI (urinary tract infection) 05/06/2016  . Dehydration   . Normocytic anemia 05/05/2016   Past Medical History:  Diagnosis Date  . Chronic kidney disease 05/11/2016   ARF-- dehydration  resolved by  discharged  . Diabetes mellitus    Type II  . Diabetic retinopathy ([place])   . Diverticulitis   . History of kidney stones    passed- 4  . Osteomyelitis ([place])     Family History  Problem Relation Age of Onset  . Diabetes Mother   . Asthma Sister     Past Surgical History:  Procedure Laterality Date  .  AMPUTATION Right 08/14/2016   Procedure: RIGHT 1ST RAY AMPUTATION MID-SHAFT;  Surgeon: Newt Minion, MD;  Location: [facility];  Service: Orthopedics;  Laterality: Right;  . INCISION AND DRAINAGE ABSCESS Left 02/11/2014   Procedure: INCISION AND DRAINAGE ABSCESS Left Buttock;  Surgeon: Jackolyn Confer, MD;  [facility];  Service: General;  Laterality: Left;  . LAPAROSCOPIC SMALL BOWEL RESECTION N/A 05/09/2016   Procedure: LAPAROSCOPIC SMALL BOWEL RESECTION;  Surgeon: Michael Boston, MD;  [facility];  Service: General;  Laterality: N/A;  . LAPAROSCOPY N/A 05/09/2016   Procedure: LAPAROSCOPY DIAGNOSTIC, LYSIS OF ADHESIONS, SMALL BOWEL RESECTION X 2;  Surgeon: Michael Boston, MD;  [facility];  Service: General;  Laterality: N/A;  . TOE AMPUTATION     left foot great toe   Social History   Occupational History  . Not on file.   Social History Main Topics  . Smoking status: Never Smoker  . Smokeless tobacco: Never Used  . Alcohol use No  . Drug use: No  . Sexual activity: Not on file

## 2016-10-21 DIAGNOSIS — H2512 Age-related nuclear cataract, left eye: Secondary | ICD-10-CM | POA: Diagnosis not present

## 2016-10-21 DIAGNOSIS — H25812 Combined forms of age-related cataract, left eye: Secondary | ICD-10-CM | POA: Diagnosis not present

## 2016-10-22 DIAGNOSIS — H35351 Cystoid macular degeneration, right eye: Secondary | ICD-10-CM | POA: Diagnosis not present

## 2016-11-17 DIAGNOSIS — H59032 Cystoid macular edema following cataract surgery, left eye: Secondary | ICD-10-CM | POA: Diagnosis not present

## 2016-12-16 DIAGNOSIS — E119 Type 2 diabetes mellitus without complications: Secondary | ICD-10-CM | POA: Diagnosis not present

## 2016-12-16 DIAGNOSIS — I10 Essential (primary) hypertension: Secondary | ICD-10-CM | POA: Diagnosis not present

## 2016-12-16 DIAGNOSIS — D509 Iron deficiency anemia, unspecified: Secondary | ICD-10-CM | POA: Diagnosis not present

## 2016-12-21 DIAGNOSIS — E1142 Type 2 diabetes mellitus with diabetic polyneuropathy: Secondary | ICD-10-CM | POA: Diagnosis not present

## 2016-12-21 DIAGNOSIS — E1151 Type 2 diabetes mellitus with diabetic peripheral angiopathy without gangrene: Secondary | ICD-10-CM | POA: Diagnosis not present

## 2016-12-21 DIAGNOSIS — D649 Anemia, unspecified: Secondary | ICD-10-CM | POA: Diagnosis not present

## 2016-12-21 DIAGNOSIS — I10 Essential (primary) hypertension: Secondary | ICD-10-CM | POA: Diagnosis not present

## 2016-12-21 DIAGNOSIS — Z1211 Encounter for screening for malignant neoplasm of colon: Secondary | ICD-10-CM | POA: Diagnosis not present

## 2017-02-03 DIAGNOSIS — I10 Essential (primary) hypertension: Secondary | ICD-10-CM | POA: Diagnosis not present

## 2017-02-03 DIAGNOSIS — D649 Anemia, unspecified: Secondary | ICD-10-CM | POA: Diagnosis not present

## 2017-02-19 DIAGNOSIS — Z6822 Body mass index (BMI) 22.0-22.9, adult: Secondary | ICD-10-CM | POA: Diagnosis not present

## 2017-02-19 DIAGNOSIS — I10 Essential (primary) hypertension: Secondary | ICD-10-CM | POA: Diagnosis not present

## 2017-02-19 DIAGNOSIS — D649 Anemia, unspecified: Secondary | ICD-10-CM | POA: Diagnosis not present

## 2017-02-19 DIAGNOSIS — E1142 Type 2 diabetes mellitus with diabetic polyneuropathy: Secondary | ICD-10-CM | POA: Diagnosis not present

## 2017-03-22 DIAGNOSIS — E119 Type 2 diabetes mellitus without complications: Secondary | ICD-10-CM | POA: Diagnosis not present

## 2017-03-22 DIAGNOSIS — N289 Disorder of kidney and ureter, unspecified: Secondary | ICD-10-CM | POA: Diagnosis not present

## 2017-03-22 DIAGNOSIS — D649 Anemia, unspecified: Secondary | ICD-10-CM | POA: Diagnosis not present

## 2017-03-22 DIAGNOSIS — I10 Essential (primary) hypertension: Secondary | ICD-10-CM | POA: Diagnosis not present

## 2017-03-24 DIAGNOSIS — I10 Essential (primary) hypertension: Secondary | ICD-10-CM | POA: Diagnosis not present

## 2017-03-24 DIAGNOSIS — N289 Disorder of kidney and ureter, unspecified: Secondary | ICD-10-CM | POA: Diagnosis not present

## 2017-03-24 DIAGNOSIS — E1151 Type 2 diabetes mellitus with diabetic peripheral angiopathy without gangrene: Secondary | ICD-10-CM | POA: Diagnosis not present

## 2017-03-24 DIAGNOSIS — K649 Unspecified hemorrhoids: Secondary | ICD-10-CM | POA: Diagnosis not present

## 2017-03-24 DIAGNOSIS — Z1211 Encounter for screening for malignant neoplasm of colon: Secondary | ICD-10-CM | POA: Diagnosis not present

## 2017-03-24 DIAGNOSIS — D649 Anemia, unspecified: Secondary | ICD-10-CM | POA: Diagnosis not present

## 2017-04-15 DIAGNOSIS — K648 Other hemorrhoids: Secondary | ICD-10-CM | POA: Diagnosis not present

## 2017-04-15 DIAGNOSIS — D509 Iron deficiency anemia, unspecified: Secondary | ICD-10-CM | POA: Diagnosis not present

## 2017-04-19 DIAGNOSIS — K219 Gastro-esophageal reflux disease without esophagitis: Secondary | ICD-10-CM | POA: Diagnosis not present

## 2017-04-19 DIAGNOSIS — K649 Unspecified hemorrhoids: Secondary | ICD-10-CM | POA: Diagnosis not present

## 2017-04-19 DIAGNOSIS — Z6823 Body mass index (BMI) 23.0-23.9, adult: Secondary | ICD-10-CM | POA: Diagnosis not present

## 2017-04-19 DIAGNOSIS — D649 Anemia, unspecified: Secondary | ICD-10-CM | POA: Diagnosis not present

## 2017-04-19 DIAGNOSIS — K648 Other hemorrhoids: Secondary | ICD-10-CM | POA: Diagnosis not present

## 2017-05-11 ENCOUNTER — Encounter: Payer: Self-pay | Admitting: Hematology and Oncology

## 2017-05-11 ENCOUNTER — Telehealth: Payer: Self-pay | Admitting: Hematology and Oncology

## 2017-05-11 NOTE — Telephone Encounter (Signed)
Appt has been scheduled for the pt to see Dr. Lindi Adie on 4/10 at 345pm. Pt aware to arrive 30 minutes early. Letter and directions mailed to the pt.

## 2017-06-02 ENCOUNTER — Telehealth: Payer: Self-pay | Admitting: Hematology and Oncology

## 2017-06-02 ENCOUNTER — Inpatient Hospital Stay: Payer: Medicare HMO | Attending: Hematology and Oncology | Admitting: Hematology and Oncology

## 2017-06-02 ENCOUNTER — Inpatient Hospital Stay: Payer: Medicare HMO

## 2017-06-02 VITALS — BP 164/68 | HR 69 | Temp 98.3°F | Resp 16 | Ht 64.0 in | Wt 125.8 lb

## 2017-06-02 DIAGNOSIS — E039 Hypothyroidism, unspecified: Secondary | ICD-10-CM | POA: Diagnosis not present

## 2017-06-02 DIAGNOSIS — D519 Vitamin B12 deficiency anemia, unspecified: Secondary | ICD-10-CM | POA: Diagnosis not present

## 2017-06-02 DIAGNOSIS — Z794 Long term (current) use of insulin: Secondary | ICD-10-CM | POA: Insufficient documentation

## 2017-06-02 DIAGNOSIS — D591 Other autoimmune hemolytic anemias: Secondary | ICD-10-CM | POA: Diagnosis not present

## 2017-06-02 DIAGNOSIS — E538 Deficiency of other specified B group vitamins: Secondary | ICD-10-CM | POA: Diagnosis not present

## 2017-06-02 DIAGNOSIS — D649 Anemia, unspecified: Secondary | ICD-10-CM | POA: Diagnosis not present

## 2017-06-02 DIAGNOSIS — N189 Chronic kidney disease, unspecified: Secondary | ICD-10-CM | POA: Insufficient documentation

## 2017-06-02 DIAGNOSIS — D509 Iron deficiency anemia, unspecified: Secondary | ICD-10-CM

## 2017-06-02 DIAGNOSIS — E119 Type 2 diabetes mellitus without complications: Secondary | ICD-10-CM | POA: Diagnosis not present

## 2017-06-02 LAB — CBC WITH DIFFERENTIAL (CANCER CENTER ONLY)
BASOS PCT: 1 %
Basophils Absolute: 0 10*3/uL (ref 0.0–0.1)
Eosinophils Absolute: 0.3 10*3/uL (ref 0.0–0.5)
Eosinophils Relative: 5 %
HEMATOCRIT: 28.8 % — AB (ref 34.8–46.6)
HEMOGLOBIN: 9.4 g/dL — AB (ref 11.6–15.9)
Lymphocytes Relative: 45 %
Lymphs Abs: 2.5 10*3/uL (ref 0.9–3.3)
MCH: 29.3 pg (ref 25.1–34.0)
MCHC: 32.6 g/dL (ref 31.5–36.0)
MCV: 89.7 fL (ref 79.5–101.0)
MONO ABS: 0.3 10*3/uL (ref 0.1–0.9)
Monocytes Relative: 6 %
NEUTROS ABS: 2.4 10*3/uL (ref 1.5–6.5)
NEUTROS PCT: 43 %
Platelet Count: 169 10*3/uL (ref 145–400)
RBC: 3.21 MIL/uL — ABNORMAL LOW (ref 3.70–5.45)
RDW: 13.9 % (ref 11.2–14.5)
WBC Count: 5.6 10*3/uL (ref 3.9–10.3)

## 2017-06-02 LAB — DIRECT ANTIGLOBULIN TEST (NOT AT ARMC)
DAT, IgG: NEGATIVE
DAT, complement: NEGATIVE

## 2017-06-02 LAB — VITAMIN B12: VITAMIN B 12: 178 pg/mL — AB (ref 180–914)

## 2017-06-02 LAB — RETICULOCYTES
RBC.: 3.21 MIL/uL — AB (ref 3.70–5.45)
Retic Count, Absolute: 38.5 10*3/uL (ref 33.7–90.7)
Retic Ct Pct: 1.2 % (ref 0.7–2.1)

## 2017-06-02 LAB — FOLATE: Folate: 25 ng/mL (ref >5.9)

## 2017-06-02 NOTE — Progress Notes (Signed)
[facility] NOTE  Patient Care Team: [name], MD as PCP - General (Family Medicine) Michael Boston, MD as Consulting Physician (General Surgery) Sydnee Cabal, MD as Consulting Physician (Orthopedic Surgery)  CHIEF COMPLAINTS/PURPOSE OF CONSULTATION:  Normocytic anemia  HISTORY OF PRESENTING ILLNESS:  Bailey [name] 67 y.o. female is here because of long-standing history of normocytic anemia.  Patient reportedly has had anemia since she was in her 75s.  She has had iron supplementation periodically over the course of her life.  Most recently she saw her primary care physician who obtained blood work that revealed a hemoglobin of 9.6 with an MCV of 88.  The white count and the platelet counts were normal.  Iron studies were performed which included a serum ferritin level which was 233.  Patient has had chronic medical issues including diabetes with peripheral neuropathy.  She had a abdominal surgery with a mesenteric resection for unclear etiology.  She was recently started on oral iron therapy because of this anemia.  On review of her previous blood work it appears that in June 2018 she had a hemoglobin of 10.2.  Her kidney function and liver function had appear to be in the normal range.  I reviewed her records extensively and collaborated the history with the patient.  MEDICAL HISTORY:  Past Medical History:  Diagnosis Date  . Chronic kidney disease 05/11/2016   ARF-- dehydration  resolved by  discharged  . Diabetes mellitus    Type II  . Diabetic retinopathy ([place])   . Diverticulitis   . History of kidney stones    passed- 4  . Osteomyelitis ([place])     SURGICAL HISTORY: Past Surgical History:  Procedure Laterality Date  . AMPUTATION Right 08/14/2016   Procedure: RIGHT 1ST RAY AMPUTATION MID-SHAFT;  Surgeon: Newt Minion, MD;  Location: [facility];  Service: Orthopedics;  Laterality: Right;  . INCISION AND DRAINAGE ABSCESS Left 02/11/2014   Procedure:  INCISION AND DRAINAGE ABSCESS Left Buttock;  Surgeon: Jackolyn Confer, MD;  [facility];  Service: General;  Laterality: Left;  . LAPAROSCOPIC SMALL BOWEL RESECTION N/A 05/09/2016   Procedure: LAPAROSCOPIC SMALL BOWEL RESECTION;  Surgeon: Michael Boston, MD;  [facility];  Service: General;  Laterality: N/A;  . LAPAROSCOPY N/A 05/09/2016   Procedure: LAPAROSCOPY DIAGNOSTIC, LYSIS OF ADHESIONS, SMALL BOWEL RESECTION X 2;  Surgeon: Michael Boston, MD;  [facility];  Service: General;  Laterality: N/A;  . TOE AMPUTATION     left foot great toe    SOCIAL HISTORY: Social History   Socioeconomic History  . Marital status: Married    Spouse name: Not on file  . Number of children: Not on file  . Years of education: Not on file  . Highest education level: Not on file  Occupational History  . Not on file  Social Needs  . Financial resource strain: Not on file  . Food insecurity:    Worry: Not on file    Inability: Not on file  . Transportation needs:    Medical: Not on file    Non-medical: Not on file  Tobacco Use  . Smoking status: Never Smoker  . Smokeless tobacco: Never Used  Substance and Sexual Activity  . Alcohol use: No    Alcohol/week: 0.0 oz  . Drug use: No  . Sexual activity: Not on file  Lifestyle  . Physical activity:    Days per week: Not on file    Minutes per session:  Not on file  . Stress: Not on file  Relationships  . Social connections:    Talks on phone: Not on file    Gets together: Not on file    Attends religious service: Not on file    Active member of club or organization: Not on file    Attends meetings of clubs or organizations: Not on file    Relationship status: Not on file  . Intimate partner violence:    Fear of current or ex partner: Not on file    Emotionally abused: Not on file    Physically abused: Not on file    Forced sexual activity: Not on file  Other Topics Concern  . Not on file  Social History Narrative  . Not on file     FAMILY HISTORY: Family History  Problem Relation Age of Onset  . Diabetes Mother   . Asthma Sister     ALLERGIES:  is allergic to no known allergies.  MEDICATIONS:  Current Outpatient Medications  Medication Sig Dispense Refill  . acetaminophen (TYLENOL) 500 MG tablet Take 500 mg by mouth every 6 (six) hours as needed for moderate pain (pain).    . ferrous sulfate 325 (65 FE) MG tablet Take 325 mg by mouth 2 (two) times daily with a meal.    . HYDROcodone-acetaminophen (NORCO/VICODIN) 5-325 MG tablet Take 1 tablet by mouth every 4 (four) hours as needed for moderate pain. 40 tablet 0  . insulin glargine (LANTUS) 100 UNIT/ML injection Inject 15 Units into the skin daily. Takes at 1300     No current facility-administered medications for this visit.     REVIEW OF SYSTEMS:   Constitutional: Denies fevers, chills or abnormal night sweats Eyes: Denies blurriness of vision, double vision or watery eyes Ears, nose, mouth, throat, and face: Denies mucositis or sore throat Respiratory: Denies cough, dyspnea or wheezes Cardiovascular: Denies palpitation, chest discomfort or lower extremity swelling Gastrointestinal:  Denies nausea, heartburn or change in bowel habits Skin: Denies abnormal skin rashes Lymphatics: Denies new lymphadenopathy or easy bruising Neurological:Denies numbness, tingling or new weaknesses Behavioral/Psych: Mood is stable, no new changes   All other systems were reviewed with the patient and are negative.  PHYSICAL EXAMINATION: ECOG PERFORMANCE STATUS: 1 - Symptomatic but completely ambulatory  Vitals:   06/02/17 1559  BP: (!) 164/68  Pulse: 69  Resp: 16  Temp: 98.3 F (36.8 C)  SpO2: 100%   Filed Weights   06/02/17 1559  Weight: 125 lb 12.8 oz (57.1 kg)    GENERAL:alert, no distress and comfortable SKIN: skin color, texture, turgor are normal, no rashes or significant lesions EYES: normal, conjunctiva are pink and non-injected, sclera  clear OROPHARYNX:no exudate, no erythema and lips, buccal mucosa, and tongue normal  NECK: supple, thyroid normal size, non-tender, without nodularity LYMPH:  no palpable lymphadenopathy in the cervical, axillary or inguinal LUNGS: clear to auscultation and percussion with normal breathing effort HEART: regular rate & rhythm and no murmurs and no lower extremity edema ABDOMEN:abdomen soft, non-tender and normal bowel sounds Musculoskeletal:no cyanosis of digits and no clubbing  PSYCH: alert & oriented x 3 with fluent speech NEURO: no focal motor/sensory deficits LABORATORY DATA:  I have reviewed the data as listed Lab Results  Component Value Date   WBC 5.6 06/02/2017   HGB 10.2 (L) 08/14/2016   HCT 28.8 (L) 06/02/2017   MCV 89.7 06/02/2017   PLT 169 06/02/2017   Lab Results  Component Value Date   NA  139 08/14/2016   K 3.4 (L) 08/14/2016   CL 111 08/14/2016   CO2 19 (L) 08/14/2016    RADIOGRAPHIC STUDIES: I have personally reviewed the radiological reports and agreed with the findings in the report.  ASSESSMENT AND PLAN:  Normocytic anemia Differential diagnosis: 1. Anemia due to chronic disease and inflammation 2. Combined B-12 and iron deficiency anemias 4.  Autoimmune hemolysis 5. Hypothyroidism 6. Plasma cell disorders myeloma 7. Bone marrow dysfunction with MDS  Workup performed: 1. CBC with differential to evaluate the smear 2. CMP to evaluate liver and kidney function 3. Haptoglobin, LDH, reticulocyte count to evaluate hemolysis 4. TSH 5. SPEP 6. Y-81 and folic acid levels  Patient will come back next week to go over the results of these blood tests.    All questions were answered. The patient knows to call the clinic with any problems, questions or concerns.    Harriette Ohara, MD 06/02/17

## 2017-06-02 NOTE — Assessment & Plan Note (Signed)
Differential diagnosis: 1. Anemia due to chronic disease and inflammation 2. Combined B-12 and iron deficiency anemias 4.  Autoimmune hemolysis 5. Hypothyroidism 6. Plasma cell disorders myeloma 7. Bone marrow dysfunction with MDS  Workup performed: 1. CBC with differential to evaluate the smear 2. CMP to evaluate liver and kidney function 3. Haptoglobin, LDH, reticulocyte count to evaluate hemolysis 4. TSH 5. SPEP 6. I-29 and folic acid levels  Patient will come back next week to go over the results of these blood tests.

## 2017-06-02 NOTE — Telephone Encounter (Signed)
Gave patient AVS and calendar of upcoming April appointments.

## 2017-06-03 LAB — THYROID PANEL WITH TSH
Free Thyroxine Index: 2 (ref 1.2–4.9)
T3 Uptake Ratio: 29 % (ref 24–39)
T4, Total: 7 ug/dL (ref 4.5–12.0)
TSH: 3.85 u[IU]/mL (ref 0.450–4.500)

## 2017-06-03 LAB — LACTATE DEHYDROGENASE: LDH: 167 U/L (ref 125–245)

## 2017-06-03 LAB — HAPTOGLOBIN: HAPTOGLOBIN: 127 mg/dL (ref 34–200)

## 2017-06-03 NOTE — Telephone Encounter (Signed)
Gave patient AVS and calendar of upcoming appointments.  °

## 2017-06-07 LAB — MULTIPLE MYELOMA PANEL, SERUM
ALBUMIN/GLOB SERPL: 1.1 (ref 0.7–1.7)
ALPHA2 GLOB SERPL ELPH-MCNC: 0.8 g/dL (ref 0.4–1.0)
Albumin SerPl Elph-Mcnc: 3.2 g/dL (ref 2.9–4.4)
Alpha 1: 0.3 g/dL (ref 0.0–0.4)
B-GLOBULIN SERPL ELPH-MCNC: 1 g/dL (ref 0.7–1.3)
Gamma Glob SerPl Elph-Mcnc: 1.1 g/dL (ref 0.4–1.8)
Globulin, Total: 3.1 g/dL (ref 2.2–3.9)
IGG (IMMUNOGLOBIN G), SERUM: 1112 mg/dL (ref 700–1600)
IGM (IMMUNOGLOBULIN M), SRM: 42 mg/dL (ref 26–217)
IgA: 296 mg/dL (ref 87–352)
TOTAL PROTEIN ELP: 6.3 g/dL (ref 6.0–8.5)

## 2017-06-08 NOTE — Assessment & Plan Note (Signed)
Differential diagnosis: 1. Anemia due to chronic disease and inflammation 2. Combined B-12 and iron deficiency anemias 4.  Autoimmune hemolysis 5. Hypothyroidism 6. Plasma cell disorders myeloma 7. Bone marrow dysfunction with MDS  Workup performed: 1. CBC with differential: Hb 9.4, MCV: 90 2. CMP to evaluate liver and kidney function 3. Haptoglobin: 127 , LDH: 167, reticulocyte count 1.2% DAT: Neg 4. TSH: 3.85 5. SPEP: No M protein 6. P-29: 518 and folic acid levels: 25  I reviewed the results and will start her on B 12 injections. Patients will receive weekly B12 injections x4 followed by monthly

## 2017-06-08 NOTE — Progress Notes (Signed)
Patient Care Team: Lewis Moccasin, MD as PCP - General (Family Medicine) Karie Soda, MD as Consulting Physician (General Surgery) Eugenia Mcalpine, MD as Consulting Physician (Orthopedic Surgery)  DIAGNOSIS:  Encounter Diagnoses  Name Primary?  . Normocytic anemia   . Vitamin B 12 deficiency     CHIEF COMPLIANT: Follow-up to review the results of blood work done for evaluation of normocytic anemia  INTERVAL HISTORY: Bailey [name] is a patient with above-mentioned history of normocytic anemia with extensive workup blood work and is here today to discuss the results.  She continues to feel severe fatigue and feeling cold all the time.  REVIEW OF SYSTEMS:   Constitutional: Denies fevers, chills or abnormal weight loss Eyes: Denies blurriness of vision Ears, nose, mouth, throat, and face: Denies mucositis or sore throat Respiratory: Denies cough, dyspnea or wheezes Cardiovascular: Denies palpitation, chest discomfort Gastrointestinal:  Denies nausea, heartburn or change in bowel habits Skin: Denies abnormal skin rashes Lymphatics: Denies new lymphadenopathy or easy bruising Neurological:Denies numbness, tingling or new weaknesses Behavioral/Psych: Mood is stable, no new changes  Extremities: No lower extremity edema All other systems were reviewed with the patient and are negative.  I have reviewed the past medical history, past surgical history, social history and family history with the patient and they are unchanged from previous note.  ALLERGIES:  is allergic to no known allergies.  MEDICATIONS:  Current Outpatient Medications  Medication Sig Dispense Refill  . acetaminophen (TYLENOL) 500 MG tablet Take 500 mg by mouth every 6 (six) hours as needed for moderate pain (pain).    . ferrous sulfate 325 (65 FE) MG tablet Take 325 mg by mouth 2 (two) times daily with a meal.    . HYDROcodone-acetaminophen (NORCO/VICODIN) 5-325 MG tablet Take 1 tablet by mouth every 4  (four) hours as needed for moderate pain. 40 tablet 0  . insulin glargine (LANTUS) 100 UNIT/ML injection Inject 15 Units into the skin daily. Takes at 1300     No current facility-administered medications for this visit.     PHYSICAL EXAMINATION: ECOG PERFORMANCE STATUS: 2 - Symptomatic, <50% confined to bed  Vitals:   06/09/17 1504  BP: (!) 159/67  Pulse: 66  Resp: 17  Temp: 98.1 F (36.7 C)  SpO2: 100%   Filed Weights   06/09/17 1504  Weight: 130 lb 9.6 oz (59.2 kg)    GENERAL:alert, no distress and comfortable SKIN: skin color, texture, turgor are normal, no rashes or significant lesions EYES: normal, Conjunctiva are pink and non-injected, sclera clear OROPHARYNX:no exudate, no erythema and lips, buccal mucosa, and tongue normal  NECK: supple, thyroid normal size, non-tender, without nodularity LYMPH:  no palpable lymphadenopathy in the cervical, axillary or inguinal LUNGS: clear to auscultation and percussion with normal breathing effort HEART: regular rate & rhythm and no murmurs and no lower extremity edema ABDOMEN:abdomen soft, non-tender and normal bowel sounds MUSCULOSKELETAL:no cyanosis of digits and no clubbing  NEURO: alert & oriented x 3 with fluent speech, no focal motor/sensory deficits EXTREMITIES: No lower extremity edema  LABORATORY DATA:  I have reviewed the data as listed CMP Latest Ref Rng & Units 08/14/2016 05/15/2016 05/14/2016  Glucose 65 - 99 mg/dL 92 022(K) 153(P)  BUN 6 - 20 mg/dL 85(M) 6 7  Creatinine 9.26 - 1.00 mg/dL 9.28 3.96 9.13  Sodium 135 - 145 mmol/L 139 139 138  Potassium 3.5 - 5.1 mmol/L 3.4(L) 3.0(L) 3.0(L)  Chloride 101 - 111 mmol/L 111 102 103  CO2 22 -  32 mmol/L 19(L) 31 29  Calcium 8.9 - 10.3 mg/dL 8.9 7.6(L) 7.5(L)  Total Protein 6.5 - 8.1 g/dL 7.1 - -  Total Bilirubin 0.3 - 1.2 mg/dL 0.6 - -  Alkaline Phos 38 - 126 U/L 75 - -  AST 15 - 41 U/L 20 - -  ALT 14 - 54 U/L 14 - -    Lab Results  Component Value Date   WBC 5.6  06/02/2017   HGB 10.2 (L) 08/14/2016   HCT 28.8 (L) 06/02/2017   MCV 89.7 06/02/2017   PLT 169 06/02/2017   NEUTROABS 2.4 06/02/2017    ASSESSMENT & PLAN:  Normocytic anemia Differential diagnosis: 1. Anemia due to chronic disease and inflammation 2. Combined B-12 and iron deficiency anemias 4.  Autoimmune hemolysis 5. Hypothyroidism 6. Plasma cell disorders myeloma 7. Bone marrow dysfunction with MDS  Workup performed: 1. CBC with differential: Hb 9.4, MCV: 90 2. CMP to evaluate liver and kidney function 3. Haptoglobin: 127 , LDH: 167, reticulocyte count 1.2% DAT: Neg 4. TSH: 3.85 5. SPEP: No M protein 6. C-34: 035 and folic acid levels: 25  Based on the above results, patient has profound B12 deficiency. I reviewed the results and provided her with a copy of the test results. We will start her on B 12 injections. Patients will receive weekly B12 injections x4 followed by monthly  Return to clinic in 1 month for follow-up with labs   Orders Placed This Encounter  Procedures  . CBC with Differential (Cancer Center Only)    Standing Status:   Future    Standing Expiration Date:   06/10/2018   The patient has a good understanding of the overall plan. she agrees with it. she will call with any problems that may develop before the next visit here.   Harriette Ohara, MD 06/09/17

## 2017-06-09 ENCOUNTER — Inpatient Hospital Stay (HOSPITAL_BASED_OUTPATIENT_CLINIC_OR_DEPARTMENT_OTHER): Payer: Medicare HMO | Admitting: Hematology and Oncology

## 2017-06-09 ENCOUNTER — Encounter: Payer: Self-pay | Admitting: Hematology and Oncology

## 2017-06-09 DIAGNOSIS — E538 Deficiency of other specified B group vitamins: Secondary | ICD-10-CM | POA: Diagnosis not present

## 2017-06-09 DIAGNOSIS — E119 Type 2 diabetes mellitus without complications: Secondary | ICD-10-CM | POA: Diagnosis not present

## 2017-06-09 DIAGNOSIS — D649 Anemia, unspecified: Secondary | ICD-10-CM

## 2017-06-09 DIAGNOSIS — N189 Chronic kidney disease, unspecified: Secondary | ICD-10-CM | POA: Diagnosis not present

## 2017-06-09 DIAGNOSIS — Z794 Long term (current) use of insulin: Secondary | ICD-10-CM | POA: Diagnosis not present

## 2017-06-09 DIAGNOSIS — E039 Hypothyroidism, unspecified: Secondary | ICD-10-CM | POA: Diagnosis not present

## 2017-06-09 DIAGNOSIS — D591 Other autoimmune hemolytic anemias: Secondary | ICD-10-CM | POA: Diagnosis not present

## 2017-06-09 DIAGNOSIS — D519 Vitamin B12 deficiency anemia, unspecified: Secondary | ICD-10-CM | POA: Diagnosis not present

## 2017-06-09 HISTORY — DX: Deficiency of other specified B group vitamins: E53.8

## 2017-06-14 DIAGNOSIS — D509 Iron deficiency anemia, unspecified: Secondary | ICD-10-CM | POA: Diagnosis not present

## 2017-06-14 DIAGNOSIS — E119 Type 2 diabetes mellitus without complications: Secondary | ICD-10-CM | POA: Diagnosis not present

## 2017-06-16 ENCOUNTER — Inpatient Hospital Stay: Payer: Medicare HMO

## 2017-06-16 VITALS — BP 150/74 | HR 71 | Temp 97.8°F | Resp 18

## 2017-06-16 DIAGNOSIS — E119 Type 2 diabetes mellitus without complications: Secondary | ICD-10-CM | POA: Diagnosis not present

## 2017-06-16 DIAGNOSIS — Z794 Long term (current) use of insulin: Secondary | ICD-10-CM | POA: Diagnosis not present

## 2017-06-16 DIAGNOSIS — D649 Anemia, unspecified: Secondary | ICD-10-CM | POA: Diagnosis not present

## 2017-06-16 DIAGNOSIS — E039 Hypothyroidism, unspecified: Secondary | ICD-10-CM | POA: Diagnosis not present

## 2017-06-16 DIAGNOSIS — D591 Other autoimmune hemolytic anemias: Secondary | ICD-10-CM | POA: Diagnosis not present

## 2017-06-16 DIAGNOSIS — N189 Chronic kidney disease, unspecified: Secondary | ICD-10-CM | POA: Diagnosis not present

## 2017-06-16 DIAGNOSIS — E538 Deficiency of other specified B group vitamins: Secondary | ICD-10-CM

## 2017-06-16 DIAGNOSIS — D519 Vitamin B12 deficiency anemia, unspecified: Secondary | ICD-10-CM | POA: Diagnosis not present

## 2017-06-16 MED ORDER — CYANOCOBALAMIN 1000 MCG/ML IJ SOLN
1000.0000 ug | Freq: Once | INTRAMUSCULAR | Status: AC
  Administered 2017-06-16: 1000 ug via INTRAMUSCULAR

## 2017-06-16 NOTE — Patient Instructions (Signed)
Cyanocobalamin, Vitamin B12 injection What is this medicine? CYANOCOBALAMIN (sye an oh koe BAL a min) is a man made form of vitamin B12. Vitamin B12 is used in the growth of healthy blood cells, nerve cells, and proteins in the body. It also helps with the metabolism of fats and carbohydrates. This medicine is used to treat people who can not absorb vitamin B12. This medicine may be used for other purposes; ask your health care provider or pharmacist if you have questions. COMMON BRAND NAME(S): B-12 Compliance Kit, B-12 Injection Kit, Cyomin, LA-12, Nutri-Twelve, Physicians EZ Use B-12, Primabalt What should I tell my health care provider before I take this medicine? They need to know if you have any of these conditions: -kidney disease -Leber's disease -megaloblastic anemia -an unusual or allergic reaction to cyanocobalamin, cobalt, other medicines, foods, dyes, or preservatives -pregnant or trying to get pregnant -breast-feeding How should I use this medicine? This medicine is injected into a muscle or deeply under the skin. It is usually given by a health care professional in a clinic or doctor's office. However, your doctor may teach you how to inject yourself. Follow all instructions. Talk to your pediatrician regarding the use of this medicine in children. Special care may be needed. Overdosage: If you think you have taken too much of this medicine contact a poison control center or emergency room at once. NOTE: This medicine is only for you. Do not share this medicine with others. What if I miss a dose? If you are given your dose at a clinic or doctor's office, call to reschedule your appointment. If you give your own injections and you miss a dose, take it as soon as you can. If it is almost time for your next dose, take only that dose. Do not take double or extra doses. What may interact with this medicine? -colchicine -heavy alcohol intake This list may not describe all possible  interactions. Give your health care provider a list of all the medicines, herbs, non-prescription drugs, or dietary supplements you use. Also tell them if you smoke, drink alcohol, or use illegal drugs. Some items may interact with your medicine. What should I watch for while using this medicine? Visit your doctor or health care professional regularly. You may need blood work done while you are taking this medicine. You may need to follow a special diet. Talk to your doctor. Limit your alcohol intake and avoid smoking to get the best benefit. What side effects may I notice from receiving this medicine? Side effects that you should report to your doctor or health care professional as soon as possible: -allergic reactions like skin rash, itching or hives, swelling of the face, lips, or tongue -blue tint to skin -chest tightness, pain -difficulty breathing, wheezing -dizziness -red, swollen painful area on the leg Side effects that usually do not require medical attention (report to your doctor or health care professional if they continue or are bothersome): -diarrhea -headache This list may not describe all possible side effects. Call your doctor for medical advice about side effects. You may report side effects to FDA at 1-800-FDA-1088. Where should I keep my medicine? Keep out of the reach of children. Store at room temperature between 15 and 30 degrees C (59 and 85 degrees F). Protect from light. Throw away any unused medicine after the expiration date. NOTE: This sheet is a summary. It may not cover all possible information. If you have questions about this medicine, talk to your doctor, pharmacist, or   health care provider.  2018 Elsevier/Gold Standard (2007-05-23 22:10:20)  

## 2017-06-21 DIAGNOSIS — D509 Iron deficiency anemia, unspecified: Secondary | ICD-10-CM | POA: Diagnosis not present

## 2017-06-23 ENCOUNTER — Inpatient Hospital Stay: Payer: Medicare HMO | Attending: Hematology and Oncology

## 2017-06-23 VITALS — BP 150/68 | HR 75 | Temp 98.2°F | Resp 18

## 2017-06-23 DIAGNOSIS — D649 Anemia, unspecified: Secondary | ICD-10-CM | POA: Diagnosis not present

## 2017-06-23 DIAGNOSIS — E538 Deficiency of other specified B group vitamins: Secondary | ICD-10-CM | POA: Diagnosis not present

## 2017-06-23 MED ORDER — CYANOCOBALAMIN 1000 MCG/ML IJ SOLN
1000.0000 ug | Freq: Once | INTRAMUSCULAR | Status: AC
  Administered 2017-06-23: 1000 ug via INTRAMUSCULAR

## 2017-06-23 NOTE — Patient Instructions (Signed)
Cyanocobalamin, Vitamin B12 injection What is this medicine? CYANOCOBALAMIN (sye an oh koe BAL a min) is a man made form of vitamin B12. Vitamin B12 is used in the growth of healthy blood cells, nerve cells, and proteins in the body. It also helps with the metabolism of fats and carbohydrates. This medicine is used to treat people who can not absorb vitamin B12. This medicine may be used for other purposes; ask your health care provider or pharmacist if you have questions. COMMON BRAND NAME(S): B-12 Compliance Kit, B-12 Injection Kit, Cyomin, LA-12, Nutri-Twelve, Physicians EZ Use B-12, Primabalt What should I tell my health care provider before I take this medicine? They need to know if you have any of these conditions: -kidney disease -Leber's disease -megaloblastic anemia -an unusual or allergic reaction to cyanocobalamin, cobalt, other medicines, foods, dyes, or preservatives -pregnant or trying to get pregnant -breast-feeding How should I use this medicine? This medicine is injected into a muscle or deeply under the skin. It is usually given by a health care professional in a clinic or doctor's office. However, your doctor may teach you how to inject yourself. Follow all instructions. Talk to your pediatrician regarding the use of this medicine in children. Special care may be needed. Overdosage: If you think you have taken too much of this medicine contact a poison control center or emergency room at once. NOTE: This medicine is only for you. Do not share this medicine with others. What if I miss a dose? If you are given your dose at a clinic or doctor's office, call to reschedule your appointment. If you give your own injections and you miss a dose, take it as soon as you can. If it is almost time for your next dose, take only that dose. Do not take double or extra doses. What may interact with this medicine? -colchicine -heavy alcohol intake This list may not describe all possible  interactions. Give your health care provider a list of all the medicines, herbs, non-prescription drugs, or dietary supplements you use. Also tell them if you smoke, drink alcohol, or use illegal drugs. Some items may interact with your medicine. What should I watch for while using this medicine? Visit your doctor or health care professional regularly. You may need blood work done while you are taking this medicine. You may need to follow a special diet. Talk to your doctor. Limit your alcohol intake and avoid smoking to get the best benefit. What side effects may I notice from receiving this medicine? Side effects that you should report to your doctor or health care professional as soon as possible: -allergic reactions like skin rash, itching or hives, swelling of the face, lips, or tongue -blue tint to skin -chest tightness, pain -difficulty breathing, wheezing -dizziness -red, swollen painful area on the leg Side effects that usually do not require medical attention (report to your doctor or health care professional if they continue or are bothersome): -diarrhea -headache This list may not describe all possible side effects. Call your doctor for medical advice about side effects. You may report side effects to FDA at 1-800-FDA-1088. Where should I keep my medicine? Keep out of the reach of children. Store at room temperature between 15 and 30 degrees C (59 and 85 degrees F). Protect from light. Throw away any unused medicine after the expiration date. NOTE: This sheet is a summary. It may not cover all possible information. If you have questions about this medicine, talk to your doctor, pharmacist, or   health care provider.  2018 Elsevier/Gold Standard (2007-05-23 22:10:20)  

## 2017-06-25 DIAGNOSIS — D649 Anemia, unspecified: Secondary | ICD-10-CM | POA: Diagnosis not present

## 2017-06-25 DIAGNOSIS — E1142 Type 2 diabetes mellitus with diabetic polyneuropathy: Secondary | ICD-10-CM | POA: Diagnosis not present

## 2017-06-25 DIAGNOSIS — D509 Iron deficiency anemia, unspecified: Secondary | ICD-10-CM | POA: Diagnosis not present

## 2017-06-25 DIAGNOSIS — R7989 Other specified abnormal findings of blood chemistry: Secondary | ICD-10-CM | POA: Diagnosis not present

## 2017-06-28 DIAGNOSIS — Z0000 Encounter for general adult medical examination without abnormal findings: Secondary | ICD-10-CM | POA: Diagnosis not present

## 2017-06-28 DIAGNOSIS — Z23 Encounter for immunization: Secondary | ICD-10-CM | POA: Diagnosis not present

## 2017-06-30 ENCOUNTER — Inpatient Hospital Stay: Payer: Medicare HMO

## 2017-06-30 VITALS — BP 158/71 | HR 69 | Temp 97.8°F | Resp 20

## 2017-06-30 DIAGNOSIS — E538 Deficiency of other specified B group vitamins: Secondary | ICD-10-CM

## 2017-06-30 DIAGNOSIS — D649 Anemia, unspecified: Secondary | ICD-10-CM | POA: Diagnosis not present

## 2017-06-30 MED ORDER — CYANOCOBALAMIN 1000 MCG/ML IJ SOLN
1000.0000 ug | Freq: Once | INTRAMUSCULAR | Status: AC
  Administered 2017-06-30: 1000 ug via INTRAMUSCULAR

## 2017-06-30 NOTE — Patient Instructions (Signed)
Cyanocobalamin, Vitamin B12 injection What is this medicine? CYANOCOBALAMIN (sye an oh koe BAL a min) is a man made form of vitamin B12. Vitamin B12 is used in the growth of healthy blood cells, nerve cells, and proteins in the body. It also helps with the metabolism of fats and carbohydrates. This medicine is used to treat people who can not absorb vitamin B12. This medicine may be used for other purposes; ask your health care provider or pharmacist if you have questions. COMMON BRAND NAME(S): B-12 Compliance Kit, B-12 Injection Kit, Cyomin, LA-12, Nutri-Twelve, Physicians EZ Use B-12, Primabalt What should I tell my health care provider before I take this medicine? They need to know if you have any of these conditions: -kidney disease -Leber's disease -megaloblastic anemia -an unusual or allergic reaction to cyanocobalamin, cobalt, other medicines, foods, dyes, or preservatives -pregnant or trying to get pregnant -breast-feeding How should I use this medicine? This medicine is injected into a muscle or deeply under the skin. It is usually given by a health care professional in a clinic or doctor's office. However, your doctor may teach you how to inject yourself. Follow all instructions. Talk to your pediatrician regarding the use of this medicine in children. Special care may be needed. Overdosage: If you think you have taken too much of this medicine contact a poison control center or emergency room at once. NOTE: This medicine is only for you. Do not share this medicine with others. What if I miss a dose? If you are given your dose at a clinic or doctor's office, call to reschedule your appointment. If you give your own injections and you miss a dose, take it as soon as you can. If it is almost time for your next dose, take only that dose. Do not take double or extra doses. What may interact with this medicine? -colchicine -heavy alcohol intake This list may not describe all possible  interactions. Give your health care provider a list of all the medicines, herbs, non-prescription drugs, or dietary supplements you use. Also tell them if you smoke, drink alcohol, or use illegal drugs. Some items may interact with your medicine. What should I watch for while using this medicine? Visit your doctor or health care professional regularly. You may need blood work done while you are taking this medicine. You may need to follow a special diet. Talk to your doctor. Limit your alcohol intake and avoid smoking to get the best benefit. What side effects may I notice from receiving this medicine? Side effects that you should report to your doctor or health care professional as soon as possible: -allergic reactions like skin rash, itching or hives, swelling of the face, lips, or tongue -blue tint to skin -chest tightness, pain -difficulty breathing, wheezing -dizziness -red, swollen painful area on the leg Side effects that usually do not require medical attention (report to your doctor or health care professional if they continue or are bothersome): -diarrhea -headache This list may not describe all possible side effects. Call your doctor for medical advice about side effects. You may report side effects to FDA at 1-800-FDA-1088. Where should I keep my medicine? Keep out of the reach of children. Store at room temperature between 15 and 30 degrees C (59 and 85 degrees F). Protect from light. Throw away any unused medicine after the expiration date. NOTE: This sheet is a summary. It may not cover all possible information. If you have questions about this medicine, talk to your doctor, pharmacist, or   health care provider.  2018 Elsevier/Gold Standard (2007-05-23 22:10:20)  

## 2017-07-01 ENCOUNTER — Other Ambulatory Visit: Payer: Self-pay | Admitting: Family Medicine

## 2017-07-01 DIAGNOSIS — Z1231 Encounter for screening mammogram for malignant neoplasm of breast: Secondary | ICD-10-CM

## 2017-07-05 ENCOUNTER — Telehealth: Payer: Self-pay | Admitting: Hematology and Oncology

## 2017-07-05 NOTE — Telephone Encounter (Signed)
Patients husband called to r/s appointment because of work

## 2017-07-06 ENCOUNTER — Inpatient Hospital Stay: Payer: Medicare HMO

## 2017-07-06 ENCOUNTER — Inpatient Hospital Stay: Payer: Medicare HMO | Admitting: Hematology and Oncology

## 2017-07-08 ENCOUNTER — Inpatient Hospital Stay: Payer: Medicare HMO

## 2017-07-08 ENCOUNTER — Inpatient Hospital Stay (HOSPITAL_BASED_OUTPATIENT_CLINIC_OR_DEPARTMENT_OTHER): Payer: Medicare HMO | Admitting: Hematology and Oncology

## 2017-07-08 DIAGNOSIS — E538 Deficiency of other specified B group vitamins: Secondary | ICD-10-CM | POA: Diagnosis not present

## 2017-07-08 DIAGNOSIS — D649 Anemia, unspecified: Secondary | ICD-10-CM

## 2017-07-08 LAB — CBC WITH DIFFERENTIAL (CANCER CENTER ONLY)
BASOS PCT: 1 %
Basophils Absolute: 0 10*3/uL (ref 0.0–0.1)
Eosinophils Absolute: 0.3 10*3/uL (ref 0.0–0.5)
Eosinophils Relative: 4 %
HCT: 29.4 % — ABNORMAL LOW (ref 34.8–46.6)
Hemoglobin: 9.5 g/dL — ABNORMAL LOW (ref 11.6–15.9)
LYMPHS ABS: 2.7 10*3/uL (ref 0.9–3.3)
Lymphocytes Relative: 39 %
MCH: 29.1 pg (ref 25.1–34.0)
MCHC: 32.3 g/dL (ref 31.5–36.0)
MCV: 90.2 fL (ref 79.5–101.0)
MONO ABS: 0.4 10*3/uL (ref 0.1–0.9)
MONOS PCT: 6 %
Neutro Abs: 3.5 10*3/uL (ref 1.5–6.5)
Neutrophils Relative %: 50 %
Platelet Count: 155 10*3/uL (ref 145–400)
RBC: 3.26 MIL/uL — ABNORMAL LOW (ref 3.70–5.45)
RDW: 13.9 % (ref 11.2–14.5)
WBC Count: 6.9 10*3/uL (ref 3.9–10.3)

## 2017-07-08 MED ORDER — CYANOCOBALAMIN 1000 MCG/ML IJ SOLN
1000.0000 ug | Freq: Once | INTRAMUSCULAR | Status: AC
  Administered 2017-07-08: 1000 ug via INTRAMUSCULAR

## 2017-07-08 NOTE — Patient Instructions (Signed)
Cyanocobalamin, Vitamin B12 injection What is this medicine? CYANOCOBALAMIN (sye an oh koe BAL a min) is a man made form of vitamin B12. Vitamin B12 is used in the growth of healthy blood cells, nerve cells, and proteins in the body. It also helps with the metabolism of fats and carbohydrates. This medicine is used to treat people who can not absorb vitamin B12. This medicine may be used for other purposes; ask your health care provider or pharmacist if you have questions. COMMON BRAND NAME(S): B-12 Compliance Kit, B-12 Injection Kit, Cyomin, LA-12, Nutri-Twelve, Physicians EZ Use B-12, Primabalt What should I tell my health care provider before I take this medicine? They need to know if you have any of these conditions: -kidney disease -Leber's disease -megaloblastic anemia -an unusual or allergic reaction to cyanocobalamin, cobalt, other medicines, foods, dyes, or preservatives -pregnant or trying to get pregnant -breast-feeding How should I use this medicine? This medicine is injected into a muscle or deeply under the skin. It is usually given by a health care professional in a clinic or doctor's office. However, your doctor may teach you how to inject yourself. Follow all instructions. Talk to your pediatrician regarding the use of this medicine in children. Special care may be needed. Overdosage: If you think you have taken too much of this medicine contact a poison control center or emergency room at once. NOTE: This medicine is only for you. Do not share this medicine with others. What if I miss a dose? If you are given your dose at a clinic or doctor's office, call to reschedule your appointment. If you give your own injections and you miss a dose, take it as soon as you can. If it is almost time for your next dose, take only that dose. Do not take double or extra doses. What may interact with this medicine? -colchicine -heavy alcohol intake This list may not describe all possible  interactions. Give your health care provider a list of all the medicines, herbs, non-prescription drugs, or dietary supplements you use. Also tell them if you smoke, drink alcohol, or use illegal drugs. Some items may interact with your medicine. What should I watch for while using this medicine? Visit your doctor or health care professional regularly. You may need blood work done while you are taking this medicine. You may need to follow a special diet. Talk to your doctor. Limit your alcohol intake and avoid smoking to get the best benefit. What side effects may I notice from receiving this medicine? Side effects that you should report to your doctor or health care professional as soon as possible: -allergic reactions like skin rash, itching or hives, swelling of the face, lips, or tongue -blue tint to skin -chest tightness, pain -difficulty breathing, wheezing -dizziness -red, swollen painful area on the leg Side effects that usually do not require medical attention (report to your doctor or health care professional if they continue or are bothersome): -diarrhea -headache This list may not describe all possible side effects. Call your doctor for medical advice about side effects. You may report side effects to FDA at 1-800-FDA-1088. Where should I keep my medicine? Keep out of the reach of children. Store at room temperature between 15 and 30 degrees C (59 and 85 degrees F). Protect from light. Throw away any unused medicine after the expiration date. NOTE: This sheet is a summary. It may not cover all possible information. If you have questions about this medicine, talk to your doctor, pharmacist, or   health care provider.  2018 Elsevier/Gold Standard (2007-05-23 22:10:20)  

## 2017-07-08 NOTE — Progress Notes (Signed)
Patient Care Team: [name], MD as PCP - General (Family Medicine) Michael Boston, MD as Consulting Physician (General Surgery) Sydnee Cabal, MD as Consulting Physician (Orthopedic Surgery)  DIAGNOSIS:  Encounter Diagnosis  Name Primary?  . Normocytic anemia    Current treatment: B12 injections CHIEF COMPLIANT: Follow-up on anemia  INTERVAL HISTORY: Bailey [name] is a 67 year old with above-mentioned history of normocytic anemia with extensive work-up that revealed B12 deficiency.  She is currently on B12 injections weekly x4 and is here to recheck her blood work.  She is tolerating the injections fairly well.  Unfortunately she has not noticed any improvement in her energy levels.  Her hemoglobin has not changed as well.  REVIEW OF SYSTEMS:   Constitutional: Complains of fatigue Eyes: Denies blurriness of vision Ears, nose, mouth, throat, and face: Denies mucositis or sore throat Respiratory: Denies cough, dyspnea or wheezes Cardiovascular: Denies palpitation, chest discomfort Gastrointestinal:  Denies nausea, heartburn or change in bowel habits Skin: Denies abnormal skin rashes Lymphatics: Denies new lymphadenopathy or easy bruising Neurological:Denies numbness, tingling or new weaknesses Behavioral/Psych: Mood is stable, no new changes  Extremities: No lower extremity edema  All other systems were reviewed with the patient and are negative.  I have reviewed the past medical history, past surgical history, social history and family history with the patient and they are unchanged from previous note.  ALLERGIES:  is allergic to no known allergies.  MEDICATIONS:  Current Outpatient Medications  Medication Sig Dispense Refill  . acetaminophen (TYLENOL) 500 MG tablet Take 500 mg by mouth every 6 (six) hours as needed for moderate pain (pain).    . ferrous sulfate 325 (65 FE) MG tablet Take 325 mg by mouth 2 (two) times daily with a meal.    .  HYDROcodone-acetaminophen (NORCO/VICODIN) 5-325 MG tablet Take 1 tablet by mouth every 4 (four) hours as needed for moderate pain. 40 tablet 0  . insulin glargine (LANTUS) 100 UNIT/ML injection Inject 15 Units into the skin daily. Takes at 1300     No current facility-administered medications for this visit.    Facility-Administered Medications Ordered in Other Visits  Medication Dose Route Frequency Provider Last Rate Last Dose  . cyanocobalamin ((VITAMIN B-12)) injection 1,000 mcg  1,000 mcg Intramuscular Once Nicholas Lose, MD        PHYSICAL EXAMINATION: ECOG PERFORMANCE STATUS: 1 - Symptomatic but completely ambulatory  Vitals:   07/08/17 1535  BP: (!) 151/68  Pulse: 71  Resp: 18  Temp: 98.4 F (36.9 C)  SpO2: 100%   Filed Weights   07/08/17 1535  Weight: 129 lb 12.8 oz (58.9 kg)    GENERAL:alert, no distress and comfortable SKIN: skin color, texture, turgor are normal, no rashes or significant lesions EYES: normal, Conjunctiva are pink and non-injected, sclera clear OROPHARYNX:no exudate, no erythema and lips, buccal mucosa, and tongue normal  NECK: supple, thyroid normal size, non-tender, without nodularity LYMPH:  no palpable lymphadenopathy in the cervical, axillary or inguinal LUNGS: clear to auscultation and percussion with normal breathing effort HEART: regular rate & rhythm and no murmurs and no lower extremity edema ABDOMEN:abdomen soft, non-tender and normal bowel sounds MUSCULOSKELETAL:no cyanosis of digits and no clubbing  NEURO: alert & oriented x 3 with fluent speech, no focal motor/sensory deficits EXTREMITIES: No lower extremity edema   LABORATORY DATA:  I have reviewed the data as listed CMP Latest Ref Rng & Units 08/14/2016 05/15/2016 05/14/2016  Glucose 65 - 99 mg/dL 92 110(H) 122(H)  BUN  6 - 20 mg/dL 29(H) 6 7  Creatinine 0.44 - 1.00 mg/dL 0.91 0.75 0.63  Sodium 135 - 145 mmol/L 139 139 138  Potassium 3.5 - 5.1 mmol/L 3.4(L) 3.0(L) 3.0(L)    Chloride 101 - 111 mmol/L 111 102 103  CO2 22 - 32 mmol/L 19(L) 31 29  Calcium 8.9 - 10.3 mg/dL 8.9 7.6(L) 7.5(L)  Total Protein 6.5 - 8.1 g/dL 7.1 - -  Total Bilirubin 0.3 - 1.2 mg/dL 0.6 - -  Alkaline Phos 38 - 126 U/L 75 - -  AST 15 - 41 U/L 20 - -  ALT 14 - 54 U/L 14 - -    Lab Results  Component Value Date   WBC 6.9 07/08/2017   HGB 9.5 (L) 07/08/2017   HCT 29.4 (L) 07/08/2017   MCV 90.2 07/08/2017   PLT 155 07/08/2017   NEUTROABS 3.5 07/08/2017    ASSESSMENT & PLAN:  Normocytic anemia Due to severe B12 deficiency Current treatment: B12 injections weekly x4 followed by monthly started April 2019  Lab review: Hemoglobin is 9.5, MCV 90 There has not been any improvement in Her hemoglobin levels I recommended bone marrow biopsy. We will schedule this sometime next week.  I will request Mendel Ryder to perform the bone marrow.  I would like to see her back one week after the bone marrow biopsy to review the results.  Return to clinic monthly for B12 injections and in 6 months for follow-up with me    No orders of the defined types were placed in this encounter.  The patient has a good understanding of the overall plan. she agrees with it. she will call with any problems that may develop before the next visit here.   Harriette Ohara, MD 07/08/17

## 2017-07-08 NOTE — Assessment & Plan Note (Signed)
Due to severe B12 deficiency Current treatment: B12 injections weekly x4 followed by monthly started April 2019  Lab review:  Return to clinic monthly for B12 injections and in 6 months for follow-up with me

## 2017-07-09 ENCOUNTER — Telehealth: Payer: Self-pay

## 2017-07-09 NOTE — Telephone Encounter (Signed)
Spoke with patient to inform of bone marrow biopsy appt on 07/14/17 and to arrive at 7:30am.  Pt voiced understanding. 

## 2017-07-13 ENCOUNTER — Other Ambulatory Visit: Payer: Self-pay | Admitting: Adult Health

## 2017-07-13 ENCOUNTER — Inpatient Hospital Stay: Payer: Medicare HMO | Admitting: Adult Health

## 2017-07-13 ENCOUNTER — Telehealth: Payer: Self-pay | Admitting: Adult Health

## 2017-07-13 VITALS — BP 184/71 | HR 59 | Temp 97.7°F | Resp 16

## 2017-07-13 DIAGNOSIS — D649 Anemia, unspecified: Secondary | ICD-10-CM

## 2017-07-13 MED ORDER — LIDOCAINE HCL 2 % IJ SOLN
INTRAMUSCULAR | Status: AC
  Filled 2017-07-13: qty 20

## 2017-07-13 NOTE — Progress Notes (Signed)
INDICATION: rule out MDS, refractory anemia   Bone Marrow Biopsy and Aspiration Procedure Note   Informed consent was obtained and potential risks including bleeding, infection and pain were reviewed with the patient.  The patient's name, date of birth, identification, consent and allergies were verified prior to the start of procedure and time out was performed.  The right posterior iliac crest was chosen as the site of biopsy.  The skin was prepped with ChloraPrep.   16 cc of 2% lidocaine was used to provide local anaesthesia.   After four attempts, was unable to obtain biopsy.  Will send patient to CT scan for biopsy.    Pressure was applied to the biopsy site and bandage was placed over the biopsy site. Patient was made to lie on the back for 30 mins prior to discharge.  The procedure was tolerated well. COMPLICATIONS: None BLOOD LOSS: minimal Patient was provided with post bone marrow biopsy instructions and instructed to call if there was any bleeding or worsening pain.    Signed Scot Dock, NP

## 2017-07-13 NOTE — Progress Notes (Signed)
0910 -    Right iliac crest occlusive dressing clean, dry and intact.  Site unremarkable.  Pt was stable at discharge; however, pt was disappointed that procedure was unsuccessful.  Pt was discharge by wheelchair with nurse to meet husband in the car.

## 2017-07-13 NOTE — Patient Instructions (Signed)
Bone Marrow Aspiration and Bone Marrow Biopsy, Adult, Care After This sheet gives you information about how to care for yourself after your procedure. Your health care provider may also give you more specific instructions. If you have problems or questions, contact your health care provider. What can I expect after the procedure? After the procedure, it is common to have:  Mild pain and tenderness.  Swelling.  Bruising.  Follow these instructions at home:  Take over-the-counter or prescription medicines only as told by your health care provider.  Do not take baths, swim, or use a hot tub until your health care provider approves. Ask if you can take a shower or have a sponge bath.  Follow instructions from your health care provider about how to take care of the puncture site. Make sure you: ? Wash your hands with soap and water before you change your bandage (dressing). If soap and water are not available, use hand sanitizer. ? Change your dressing as told by your health care provider.  Check your puncture siteevery day for signs of infection. Check for: ? More redness, swelling, or pain. ? More fluid or blood. ? Warmth. ? Pus or a bad smell.  Return to your normal activities as told by your health care provider. Ask your health care provider what activities are safe for you.  Do not drive for 24 hours if you were given a medicine to help you relax (sedative).  Keep all follow-up visits as told by your health care provider. This is important. Contact a health care provider if:  You have more redness, swelling, or pain around the puncture site.  You have more fluid or blood coming from the puncture site.  Your puncture site feels warm to the touch.  You have pus or a bad smell coming from the puncture site.  You have a fever.  Your pain is not controlled with medicine. This information is not intended to replace advice given to you by your health care provider. Make sure  you discuss any questions you have with your health care provider. Document Released: 08/29/2004 Document Revised: 08/30/2015 Document Reviewed: 07/24/2015 Elsevier Interactive Patient Education  2018 [employer].

## 2017-07-13 NOTE — Telephone Encounter (Signed)
Per 5/21 no los

## 2017-07-13 NOTE — Progress Notes (Signed)
CT guided bone marrow biopsy.

## 2017-07-21 ENCOUNTER — Other Ambulatory Visit: Payer: Self-pay | Admitting: Radiology

## 2017-07-22 ENCOUNTER — Ambulatory Visit (HOSPITAL_COMMUNITY)
Admission: RE | Admit: 2017-07-22 | Discharge: 2017-07-22 | Disposition: A | Discharge status: Home / Self Care | Payer: Medicare HMO | Source: Ambulatory Visit | Attending: Adult Health | Admitting: Adult Health

## 2017-07-22 ENCOUNTER — Encounter (HOSPITAL_COMMUNITY): Payer: Self-pay

## 2017-07-22 DIAGNOSIS — Z794 Long term (current) use of insulin: Secondary | ICD-10-CM | POA: Insufficient documentation

## 2017-07-22 DIAGNOSIS — D72822 Plasmacytosis: Secondary | ICD-10-CM | POA: Diagnosis not present

## 2017-07-22 DIAGNOSIS — Z89411 Acquired absence of right great toe: Secondary | ICD-10-CM | POA: Insufficient documentation

## 2017-07-22 DIAGNOSIS — Z79899 Other long term (current) drug therapy: Secondary | ICD-10-CM | POA: Diagnosis not present

## 2017-07-22 DIAGNOSIS — E538 Deficiency of other specified B group vitamins: Secondary | ICD-10-CM | POA: Diagnosis not present

## 2017-07-22 DIAGNOSIS — Z87442 Personal history of urinary calculi: Secondary | ICD-10-CM | POA: Insufficient documentation

## 2017-07-22 DIAGNOSIS — D649 Anemia, unspecified: Secondary | ICD-10-CM | POA: Insufficient documentation

## 2017-07-22 DIAGNOSIS — E11319 Type 2 diabetes mellitus with unspecified diabetic retinopathy without macular edema: Secondary | ICD-10-CM | POA: Insufficient documentation

## 2017-07-22 LAB — PROTIME-INR
INR: 1.07
Prothrombin Time: 13.8 seconds (ref 11.4–15.2)

## 2017-07-22 LAB — CBC
HCT: 31.4 % — ABNORMAL LOW (ref 36.0–46.0)
Hemoglobin: 10 g/dL — ABNORMAL LOW (ref 12.0–15.0)
MCH: 29.3 pg (ref 26.0–34.0)
MCHC: 31.8 g/dL (ref 30.0–36.0)
MCV: 92.1 fL (ref 78.0–100.0)
PLATELETS: 195 10*3/uL (ref 150–400)
RBC: 3.41 MIL/uL — ABNORMAL LOW (ref 3.87–5.11)
RDW: 13.5 % (ref 11.5–15.5)
WBC: 8 10*3/uL (ref 4.0–10.5)

## 2017-07-22 LAB — GLUCOSE, CAPILLARY: GLUCOSE-CAPILLARY: 128 mg/dL — AB (ref 65–99)

## 2017-07-22 LAB — APTT: aPTT: 32 seconds (ref 24–36)

## 2017-07-22 MED ORDER — MIDAZOLAM HCL 2 MG/2ML IJ SOLN
INTRAMUSCULAR | Status: AC
  Filled 2017-07-22: qty 4

## 2017-07-22 MED ORDER — FENTANYL CITRATE (PF) 100 MCG/2ML IJ SOLN
INTRAMUSCULAR | Status: AC | PRN
  Administered 2017-07-22: 25 ug via INTRAVENOUS
  Administered 2017-07-22: 50 ug via INTRAVENOUS
  Administered 2017-07-22: 25 ug via INTRAVENOUS

## 2017-07-22 MED ORDER — SODIUM CHLORIDE 0.9 % IV SOLN
INTRAVENOUS | Status: DC
  Administered 2017-07-22: 08:00:00 via INTRAVENOUS

## 2017-07-22 MED ORDER — FENTANYL CITRATE (PF) 100 MCG/2ML IJ SOLN
INTRAMUSCULAR | Status: AC
  Filled 2017-07-22: qty 4

## 2017-07-22 MED ORDER — MIDAZOLAM HCL 2 MG/2ML IJ SOLN
INTRAMUSCULAR | Status: AC | PRN
  Administered 2017-07-22: 0.5 mg via INTRAVENOUS
  Administered 2017-07-22: 1 mg via INTRAVENOUS
  Administered 2017-07-22: 0.5 mg via INTRAVENOUS

## 2017-07-22 NOTE — Discharge Instructions (Signed)
Moderate Conscious Sedation, Adult, Care After These instructions provide you with information about caring for yourself after your procedure. Your health care provider may also give you more specific instructions. Your treatment has been planned according to current medical practices, but problems sometimes occur. Call your health care provider if you have any problems or questions after your procedure. What can I expect after the procedure? After your procedure, it is common:  To feel sleepy for several hours.  To feel clumsy and have poor balance for several hours.  To have poor judgment for several hours.  To vomit if you eat too soon.  Follow these instructions at home: For at least 24 hours after the procedure:   Do not: ? Participate in activities where you could fall or become injured. ? Drive. ? Use heavy machinery. ? Drink alcohol. ? Take sleeping pills or medicines that cause drowsiness. ? Make important decisions or sign legal documents. ? Take care of children on your own.  Rest. Eating and drinking  Follow the diet recommended by your health care provider.  If you vomit: ? Drink water, juice, or soup when you can drink without vomiting. ? Make sure you have little or no nausea before eating solid foods. General instructions  Have a responsible adult stay with you until you are awake and alert.  Take over-the-counter and prescription medicines only as told by your health care provider.  If you smoke, do not smoke without supervision.  Keep all follow-up visits as told by your health care provider. This is important. Contact a health care provider if:  You keep feeling nauseous or you keep vomiting.  You feel light-headed.  You develop a rash.  You have a fever. Get help right away if:  You have trouble breathing. This information is not intended to replace advice given to you by your health care provider. Make sure you discuss any questions you have  with your health care provider. Document Released: 11/30/2012 Document Revised: 07/15/2015 Document Reviewed: 06/01/2015 Elsevier Interactive Patient Education  2018 [employer].   Bone Marrow Aspiration and Bone Marrow Biopsy, Adult, Care After This sheet gives you information about how to care for yourself after your procedure. Your health care provider may also give you more specific instructions. If you have problems or questions, contact your health care provider. What can I expect after the procedure? After the procedure, it is common to have:  Mild pain and tenderness.  Swelling.  Bruising.  Follow these instructions at home:  Take over-the-counter or prescription medicines only as told by your health care provider.  Do not take baths, swim, or use a hot tub until your health care provider approves. Ask if you can take a shower or have a sponge bath.  You may shower tomorrow.  Follow instructions from your health care provider about how to take care of the puncture site. Make sure you: ? Wash your hands with soap and water before you change your bandage (dressing). If soap and water are not available, use hand sanitizer. ? Change your dressing as told by your health care provider.  You may remove your dressing tomorrow.  Check your puncture siteevery day for signs of infection. Check for: ? More redness, swelling, or pain. ? More fluid or blood. ? Warmth. ? Pus or a bad smell.  Return to your normal activities as told by your health care provider. Ask your health care provider what activities are safe for you.  Do not drive  for 24 hours if you were given a medicine to help you relax (sedative). °· Keep all follow-up visits as told by your health care provider. This is important. °Contact a health care provider if: °· You have more redness, swelling, or pain around the puncture site. °· You have more fluid or blood coming from the puncture site. °· Your puncture site feels  warm to the touch. °· You have pus or a bad smell coming from the puncture site. °· You have a fever. °· Your pain is not controlled with medicine. °This information is not intended to replace advice given to you by your health care provider. Make sure you discuss any questions you have with your health care provider. °Document Released: 08/29/2004 Document Revised: 08/30/2015 Document Reviewed: 07/24/2015 °Elsevier Interactive Patient Education © 2018 Elsevier Inc. ° °

## 2017-07-22 NOTE — Consult Note (Signed)
 Chief Complaint: Patient was seen in consultation today for CT-guided bone marrow biopsy  Referring Physician(s): Gudena,V  Supervising Physician: Wagner, Jaime  Patient Status: [facility] - Out-pt  History of Present Illness: Bailey [name] is a 66 y.o. female with history of B12 deficiency and persistent normocytic anemia despite B12 injections who presents today for CT-guided bone marrow biopsy for further evaluation.  Additional history as below.  Past Medical History:  Diagnosis Date  . Chronic kidney disease 05/11/2016   ARF-- dehydration  resolved by  discharged  . Diabetes mellitus    Type II  . Diabetic retinopathy (HCC)   . Diverticulitis   . History of kidney stones    passed- 6  . Osteomyelitis (HCC)   . Vitamin B 12 deficiency 06/09/2017    Past Surgical History:  Procedure Laterality Date  . AMPUTATION Right 08/14/2016   Procedure: RIGHT 1ST RAY AMPUTATION MID-SHAFT;  Surgeon: Duda, Marcus V, MD;  Location: [facility] OR;  Service: Orthopedics;  Laterality: Right;  . INCISION AND DRAINAGE ABSCESS Left 02/11/2014   Procedure: INCISION AND DRAINAGE ABSCESS Left Buttock;  Surgeon: Todd Rosenbower, MD;  Location: [facility]S;  Service: General;  Laterality: Left;  . LAPAROSCOPIC SMALL BOWEL RESECTION N/A 05/09/2016   Procedure: LAPAROSCOPIC SMALL BOWEL RESECTION;  Surgeon: Steven Gross, MD;  Location: [facility];  Service: General;  Laterality: N/A;  . LAPAROSCOPY N/A 05/09/2016   Procedure: LAPAROSCOPY DIAGNOSTIC, LYSIS OF ADHESIONS, SMALL BOWEL RESECTION X 2;  Surgeon: Steven Gross, MD;  Location: [facility];  Service: General;  Laterality: N/A;  . TOE AMPUTATION     left foot great toe    Allergies: No known allergies  Medications: Prior to Admission medications   Medication Sig Start Date End Date Taking? Authorizing Provider  acetaminophen (TYLENOL) 500 MG tablet Take 500 mg by mouth every 6 (six) hours as needed for moderate pain (pain).   Yes [provider]  insulin  glargine (LANTUS) 100 UNIT/ML injection Inject 15 Units into the skin daily. Takes at 1300   Yes [provider]  ferrous sulfate 325 (65 FE) MG tablet Take 325 mg by mouth 2 (two) times daily with a meal.    [provider]  HYDROcodone-acetaminophen (NORCO/VICODIN) 5-325 MG tablet Take 1 tablet by mouth every 4 (four) hours as needed for moderate pain. 08/14/16   Duda, Marcus V, MD     Family History  Problem Relation Age of Onset  . Diabetes Mother   . Asthma Sister     Social History   Socioeconomic History  . Marital status: Married    Spouse name: Not on file  . Number of children: Not on file  . Years of education: Not on file  . Highest education level: Not on file  Occupational History  . Not on file  Social Needs  . Financial resource strain: Not on file  . Food insecurity:    Worry: Not on file    Inability: Not on file  . Transportation needs:    Medical: Not on file    Non-medical: Not on file  Tobacco Use  . Smoking status: Never Smoker  . Smokeless tobacco: Never Used  Substance and Sexual Activity  . Alcohol use: No    Alcohol/week: 0.0 oz  . Drug use: No  . Sexual activity: Not on file  Lifestyle  . Physical activity:    Days per week: Not on file    Minutes per session: Not on file  .   Stress: Not on file  Relationships  . Social connections:    Talks on phone: Not on file    Gets together: Not on file    Attends religious service: Not on file    Active member of club or organization: Not on file    Attends meetings of clubs or organizations: Not on file    Relationship status: Not on file  Other Topics Concern  . Not on file  Social History Narrative  . Not on file      Review of Systems denies fever, headache, chest pain, dyspnea, cough, abdominal/back pain, nausea, vomiting or bleeding.  She is anxious.  Vital Signs: Blood pressure 180/87, temperature 98.2, heart rate 72, respirations 16, O2 sat 100% room  air   Physical Exam awake, alert.  Chest clear to auscultation bilaterally.  Heart with regular rate and rhythm.  Abdomen soft, + bowel sounds, nontender.  No significant lower extremity edema  Imaging: No results found.  Labs:  CBC: Recent Labs    08/14/16 1118 06/02/17 1620 07/08/17 1455 07/22/17 0730  WBC 9.2 5.6 6.9 8.0  HGB 10.2* 9.4* 9.5* 10.0*  HCT 32.7* 28.8* 29.4* 31.4*  PLT 167 169 155 195    COAGS: Recent Labs    08/14/16 1118 07/22/17 0730  INR 1.10 1.07  APTT 32 32    BMP: Recent Labs    08/14/16 1118  NA 139  K 3.4*  CL 111  CO2 19*  GLUCOSE 92  BUN 29*  CALCIUM 8.9  CREATININE 0.91  GFRNONAA >60  GFRAA >60    LIVER FUNCTION TESTS: Recent Labs    08/14/16 1118  BILITOT 0.6  AST 20  ALT 14  ALKPHOS 75  PROT 7.1  ALBUMIN 3.5    TUMOR MARKERS: No results for input(s): AFPTM, CEA, CA199, CHROMGRNA in the last 8760 hours.  Assessment and Plan: 66 y.o. female with history of B12 deficiency and persistent normocytic anemia despite B12 injections who presents today for CT-guided bone marrow biopsy for further evaluation. Risks and benefits discussed with the patient/spouse including, but not limited to bleeding, infection, damage to adjacent structures or low yield requiring additional tests.  All of the patient's questions were answered, patient is agreeable to proceed. Consent signed and in chart.     Thank you for this interesting consult.  I greatly enjoyed meeting Bailey [name] and look forward to participating in their care.  A copy of this report was sent to the requesting provider on this date.  Electronically Signed: D. Rowe Robert, PA-C 07/22/2017, 8:25 AM   I spent a total of 20 minutes    in face to face in clinical consultation, greater than 50% of which was counseling/coordinating care for CT-guided bone marrow biopsy

## 2017-07-22 NOTE — Procedures (Signed)
Interventional Radiology Procedure Note  Procedure: CT guided aspirate and core biopsy of left iliac bone Complications: None Recommendations: - Bedrest supine x 1 hrs - OTC's PRN  Pain - Follow biopsy results  Signed,  Christin Moline S. Anijah Spohr, DO      

## 2017-07-28 ENCOUNTER — Ambulatory Visit
Admission: RE | Admit: 2017-07-28 | Discharge: 2017-07-28 | Disposition: A | Discharge status: Home / Self Care | Payer: Medicare HMO | Source: Ambulatory Visit | Attending: Family Medicine | Admitting: Family Medicine

## 2017-07-28 DIAGNOSIS — Z1231 Encounter for screening mammogram for malignant neoplasm of breast: Secondary | ICD-10-CM

## 2017-07-29 LAB — TISSUE HYBRIDIZATION (BONE MARROW)-NCBH

## 2017-07-29 LAB — CHROMOSOME ANALYSIS, BONE MARROW

## 2017-08-04 ENCOUNTER — Encounter: Payer: Self-pay | Admitting: Family Medicine

## 2017-08-04 ENCOUNTER — Inpatient Hospital Stay: Payer: Medicare HMO | Attending: Hematology and Oncology

## 2017-08-04 VITALS — BP 155/87 | HR 72 | Temp 98.0°F | Resp 18

## 2017-08-04 DIAGNOSIS — E538 Deficiency of other specified B group vitamins: Secondary | ICD-10-CM | POA: Insufficient documentation

## 2017-08-04 DIAGNOSIS — D649 Anemia, unspecified: Secondary | ICD-10-CM

## 2017-08-04 MED ORDER — CYANOCOBALAMIN 1000 MCG/ML IJ SOLN
1000.0000 ug | Freq: Once | INTRAMUSCULAR | Status: AC
  Administered 2017-08-04: 1000 ug via INTRAMUSCULAR

## 2017-08-04 NOTE — Patient Instructions (Signed)
Cyanocobalamin, Vitamin B12 injection What is this medicine? CYANOCOBALAMIN (sye an oh koe BAL a min) is a man made form of vitamin B12. Vitamin B12 is used in the growth of healthy blood cells, nerve cells, and proteins in the body. It also helps with the metabolism of fats and carbohydrates. This medicine is used to treat people who can not absorb vitamin B12. This medicine may be used for other purposes; ask your health care provider or pharmacist if you have questions. COMMON BRAND NAME(S): B-12 Compliance Kit, B-12 Injection Kit, Cyomin, LA-12, Nutri-Twelve, Physicians EZ Use B-12, Primabalt What should I tell my health care provider before I take this medicine? They need to know if you have any of these conditions: -kidney disease -Leber's disease -megaloblastic anemia -an unusual or allergic reaction to cyanocobalamin, cobalt, other medicines, foods, dyes, or preservatives -pregnant or trying to get pregnant -breast-feeding How should I use this medicine? This medicine is injected into a muscle or deeply under the skin. It is usually given by a health care professional in a clinic or doctor's office. However, your doctor may teach you how to inject yourself. Follow all instructions. Talk to your pediatrician regarding the use of this medicine in children. Special care may be needed. Overdosage: If you think you have taken too much of this medicine contact a poison control center or emergency room at once. NOTE: This medicine is only for you. Do not share this medicine with others. What if I miss a dose? If you are given your dose at a clinic or doctor's office, call to reschedule your appointment. If you give your own injections and you miss a dose, take it as soon as you can. If it is almost time for your next dose, take only that dose. Do not take double or extra doses. What may interact with this medicine? -colchicine -heavy alcohol intake This list may not describe all possible  interactions. Give your health care provider a list of all the medicines, herbs, non-prescription drugs, or dietary supplements you use. Also tell them if you smoke, drink alcohol, or use illegal drugs. Some items may interact with your medicine. What should I watch for while using this medicine? Visit your doctor or health care professional regularly. You may need blood work done while you are taking this medicine. You may need to follow a special diet. Talk to your doctor. Limit your alcohol intake and avoid smoking to get the best benefit. What side effects may I notice from receiving this medicine? Side effects that you should report to your doctor or health care professional as soon as possible: -allergic reactions like skin rash, itching or hives, swelling of the face, lips, or tongue -blue tint to skin -chest tightness, pain -difficulty breathing, wheezing -dizziness -red, swollen painful area on the leg Side effects that usually do not require medical attention (report to your doctor or health care professional if they continue or are bothersome): -diarrhea -headache This list may not describe all possible side effects. Call your doctor for medical advice about side effects. You may report side effects to FDA at 1-800-FDA-1088. Where should I keep my medicine? Keep out of the reach of children. Store at room temperature between 15 and 30 degrees C (59 and 85 degrees F). Protect from light. Throw away any unused medicine after the expiration date. NOTE: This sheet is a summary. It may not cover all possible information. If you have questions about this medicine, talk to your doctor, pharmacist, or   health care provider.  2018 Elsevier/Gold Standard (2007-05-23 22:10:20)

## 2017-09-01 ENCOUNTER — Other Ambulatory Visit: Payer: Self-pay

## 2017-09-01 ENCOUNTER — Inpatient Hospital Stay: Payer: Medicare HMO | Attending: Hematology and Oncology

## 2017-09-01 DIAGNOSIS — E538 Deficiency of other specified B group vitamins: Secondary | ICD-10-CM | POA: Diagnosis not present

## 2017-09-01 MED ORDER — CYANOCOBALAMIN 1000 MCG/ML IJ SOLN: 1000.0000 ug | Freq: Once | INTRAMUSCULAR | Status: DC

## 2017-09-01 MED ORDER — CYANOCOBALAMIN 1000 MCG/ML IJ SOLN
1000.0000 ug | Freq: Once | INTRAMUSCULAR | Status: AC
  Administered 2017-09-01: 1000 ug via INTRAMUSCULAR

## 2017-09-01 NOTE — Patient Instructions (Signed)
Cyanocobalamin, Vitamin B12 injection What is this medicine? CYANOCOBALAMIN (sye an oh koe BAL a min) is a man made form of vitamin B12. Vitamin B12 is used in the growth of healthy blood cells, nerve cells, and proteins in the body. It also helps with the metabolism of fats and carbohydrates. This medicine is used to treat people who can not absorb vitamin B12. This medicine may be used for other purposes; ask your health care provider or pharmacist if you have questions. COMMON BRAND NAME(S): B-12 Compliance Kit, B-12 Injection Kit, Cyomin, LA-12, Nutri-Twelve, Physicians EZ Use B-12, Primabalt What should I tell my health care provider before I take this medicine? They need to know if you have any of these conditions: -kidney disease -Leber's disease -megaloblastic anemia -an unusual or allergic reaction to cyanocobalamin, cobalt, other medicines, foods, dyes, or preservatives -pregnant or trying to get pregnant -breast-feeding How should I use this medicine? This medicine is injected into a muscle or deeply under the skin. It is usually given by a health care professional in a clinic or doctor's office. However, your doctor may teach you how to inject yourself. Follow all instructions. Talk to your pediatrician regarding the use of this medicine in children. Special care may be needed. Overdosage: If you think you have taken too much of this medicine contact a poison control center or emergency room at once. NOTE: This medicine is only for you. Do not share this medicine with others. What if I miss a dose? If you are given your dose at a clinic or doctor's office, call to reschedule your appointment. If you give your own injections and you miss a dose, take it as soon as you can. If it is almost time for your next dose, take only that dose. Do not take double or extra doses. What may interact with this medicine? -colchicine -heavy alcohol intake This list may not describe all possible  interactions. Give your health care provider a list of all the medicines, herbs, non-prescription drugs, or dietary supplements you use. Also tell them if you smoke, drink alcohol, or use illegal drugs. Some items may interact with your medicine. What should I watch for while using this medicine? Visit your doctor or health care professional regularly. You may need blood work done while you are taking this medicine. You may need to follow a special diet. Talk to your doctor. Limit your alcohol intake and avoid smoking to get the best benefit. What side effects may I notice from receiving this medicine? Side effects that you should report to your doctor or health care professional as soon as possible: -allergic reactions like skin rash, itching or hives, swelling of the face, lips, or tongue -blue tint to skin -chest tightness, pain -difficulty breathing, wheezing -dizziness -red, swollen painful area on the leg Side effects that usually do not require medical attention (report to your doctor or health care professional if they continue or are bothersome): -diarrhea -headache This list may not describe all possible side effects. Call your doctor for medical advice about side effects. You may report side effects to FDA at 1-800-FDA-1088. Where should I keep my medicine? Keep out of the reach of children. Store at room temperature between 15 and 30 degrees C (59 and 85 degrees F). Protect from light. Throw away any unused medicine after the expiration date. NOTE: This sheet is a summary. It may not cover all possible information. If you have questions about this medicine, talk to your doctor, pharmacist, or   health care provider.  2018 Elsevier/Gold Standard (2007-05-23 22:10:20)  

## 2017-09-01 NOTE — Progress Notes (Signed)
Injection nurse Corene Cornea needs order for B-12 injection, pt has arrived.  Reviewed Dr Geralyn Flash notes, placed order per transcribed, pt receiving B-12 injection monthly.

## 2017-09-27 DIAGNOSIS — E119 Type 2 diabetes mellitus without complications: Secondary | ICD-10-CM | POA: Diagnosis not present

## 2017-09-27 DIAGNOSIS — D509 Iron deficiency anemia, unspecified: Secondary | ICD-10-CM | POA: Diagnosis not present

## 2017-09-27 DIAGNOSIS — I10 Essential (primary) hypertension: Secondary | ICD-10-CM | POA: Diagnosis not present

## 2017-09-27 DIAGNOSIS — R7989 Other specified abnormal findings of blood chemistry: Secondary | ICD-10-CM | POA: Diagnosis not present

## 2017-09-29 ENCOUNTER — Inpatient Hospital Stay: Payer: Medicare HMO | Attending: Hematology and Oncology

## 2017-09-29 DIAGNOSIS — N189 Chronic kidney disease, unspecified: Secondary | ICD-10-CM | POA: Diagnosis not present

## 2017-09-29 DIAGNOSIS — D631 Anemia in chronic kidney disease: Secondary | ICD-10-CM | POA: Insufficient documentation

## 2017-09-29 DIAGNOSIS — E538 Deficiency of other specified B group vitamins: Secondary | ICD-10-CM

## 2017-09-29 DIAGNOSIS — Z862 Personal history of diseases of the blood and blood-forming organs and certain disorders involving the immune mechanism: Secondary | ICD-10-CM | POA: Insufficient documentation

## 2017-09-29 MED ORDER — CYANOCOBALAMIN 1000 MCG/ML IJ SOLN: 1000.0000 ug | Freq: Once | INTRAMUSCULAR | Status: DC

## 2017-09-29 MED ORDER — CYANOCOBALAMIN 1000 MCG/ML IJ SOLN
1000.0000 ug | Freq: Once | INTRAMUSCULAR | Status: AC
  Administered 2017-09-29: 1000 ug via INTRAMUSCULAR

## 2017-09-30 DIAGNOSIS — D519 Vitamin B12 deficiency anemia, unspecified: Secondary | ICD-10-CM | POA: Diagnosis not present

## 2017-09-30 DIAGNOSIS — E1142 Type 2 diabetes mellitus with diabetic polyneuropathy: Secondary | ICD-10-CM | POA: Diagnosis not present

## 2017-09-30 DIAGNOSIS — Z6823 Body mass index (BMI) 23.0-23.9, adult: Secondary | ICD-10-CM | POA: Diagnosis not present

## 2017-09-30 DIAGNOSIS — E538 Deficiency of other specified B group vitamins: Secondary | ICD-10-CM | POA: Diagnosis not present

## 2017-10-05 ENCOUNTER — Telehealth: Payer: Self-pay | Admitting: Hematology and Oncology

## 2017-10-05 NOTE — Telephone Encounter (Signed)
Left message for patient re 8/19 f/u with [facility]. Schedule mailed. Appointment scheduled per 8/13 schedule message from new patient scheduler at the request of Dr. Ernie Hew.

## 2017-10-06 ENCOUNTER — Telehealth: Payer: Self-pay | Admitting: Hematology and Oncology

## 2017-10-06 NOTE — Telephone Encounter (Signed)
Patients husband called to reschedule

## 2017-10-11 ENCOUNTER — Ambulatory Visit: Payer: Medicare HMO | Admitting: Hematology and Oncology

## 2017-10-11 ENCOUNTER — Inpatient Hospital Stay (HOSPITAL_BASED_OUTPATIENT_CLINIC_OR_DEPARTMENT_OTHER): Payer: Medicare HMO | Admitting: Hematology and Oncology

## 2017-10-11 ENCOUNTER — Inpatient Hospital Stay: Payer: Medicare HMO

## 2017-10-11 ENCOUNTER — Telehealth: Payer: Self-pay | Admitting: Hematology and Oncology

## 2017-10-11 DIAGNOSIS — E538 Deficiency of other specified B group vitamins: Secondary | ICD-10-CM | POA: Diagnosis not present

## 2017-10-11 DIAGNOSIS — D631 Anemia in chronic kidney disease: Secondary | ICD-10-CM

## 2017-10-11 DIAGNOSIS — N189 Chronic kidney disease, unspecified: Secondary | ICD-10-CM

## 2017-10-11 DIAGNOSIS — Z862 Personal history of diseases of the blood and blood-forming organs and certain disorders involving the immune mechanism: Secondary | ICD-10-CM | POA: Diagnosis not present

## 2017-10-11 DIAGNOSIS — D649 Anemia, unspecified: Secondary | ICD-10-CM

## 2017-10-11 LAB — CBC WITH DIFFERENTIAL (CANCER CENTER ONLY)
BASOS PCT: 1 %
Basophils Absolute: 0 10*3/uL (ref 0.0–0.1)
EOS ABS: 0.3 10*3/uL (ref 0.0–0.5)
Eosinophils Relative: 5 %
HEMATOCRIT: 27.3 % — AB (ref 34.8–46.6)
HEMOGLOBIN: 8.9 g/dL — AB (ref 11.6–15.9)
LYMPHS ABS: 2.7 10*3/uL (ref 0.9–3.3)
Lymphocytes Relative: 41 %
MCH: 29.6 pg (ref 25.1–34.0)
MCHC: 32.6 g/dL (ref 31.5–36.0)
MCV: 90.7 fL (ref 79.5–101.0)
MONOS PCT: 5 %
Monocytes Absolute: 0.4 10*3/uL (ref 0.1–0.9)
NEUTROS ABS: 3.2 10*3/uL (ref 1.5–6.5)
Neutrophils Relative %: 48 %
Platelet Count: 148 10*3/uL (ref 145–400)
RBC: 3.01 MIL/uL — AB (ref 3.70–5.45)
RDW: 13.7 % (ref 11.2–14.5)
WBC: 6.7 10*3/uL (ref 3.9–10.3)

## 2017-10-11 NOTE — Assessment & Plan Note (Signed)
Lab review 09/28/2017: B12 551 Folate 11 Hemoglobin 8.8 and hematocrit 28, MCV 92.7, platelet count 177 Creatinine 1.42, BUN 48, LFTs are normal  Bone marrow biopsy 07/22/2017: Normocellular marrow with trilineage hematopoiesis with maturation, mild plasmacytosis 5 to 10%, polyclonal likely reactive, cytogenetics normal, flow cytometry normal  Based upon the above data, I suggest starting the patient on erythropoietin stimulating agents to get the hemoglobin above 10 g.  I believe the cause of anemia is anemia of chronic kidney disease. Return to clinic in 3 weeks to recheck labs and follow-up. 

## 2017-10-11 NOTE — Telephone Encounter (Signed)
Gave avs and calendar ° °

## 2017-10-11 NOTE — Progress Notes (Signed)
Patient Care Team: [name], MD as PCP - General (Family Medicine) Michael Boston, MD as Consulting Physician (General Surgery) Sydnee Cabal, MD as Consulting Physician (Orthopedic Surgery)  DIAGNOSIS:  Encounter Diagnosis  Name Primary?  . Normocytic anemia    CHIEF COMPLIANT: Follow-up of normocytic anemia also getting B12 injections  INTERVAL HISTORY: Bailey [name] is a 67 year old with above-mentioned history of normocytic anemia along with chronic kidney disease who underwent recent blood work that revealed worsening hemoglobin.  We had previously performed a bone marrow biopsy which was negative for any bone marrow disorder.  She is here today accompanied by her husband and she reports that she has been fairly tired and frail appearing.  REVIEW OF SYSTEMS:   Constitutional: Denies fevers, chills or abnormal weight loss Eyes: Denies blurriness of vision Ears, nose, mouth, throat, and face: Denies mucositis or sore throat Respiratory: Denies cough, dyspnea or wheezes Cardiovascular: Denies palpitation, chest discomfort Gastrointestinal:  Denies nausea, heartburn or change in bowel habits Skin: Denies abnormal skin rashes Lymphatics: Denies new lymphadenopathy or easy bruising Neurological:Denies numbness, tingling or new weaknesses Behavioral/Psych: Mood is stable, no new changes  Extremities: No lower extremity edema   All other systems were reviewed with the patient and are negative.  I have reviewed the past medical history, past surgical history, social history and family history with the patient and they are unchanged from previous note.  ALLERGIES:  is allergic to no known allergies.  MEDICATIONS:  Current Outpatient Medications  Medication Sig Dispense Refill  . acetaminophen (TYLENOL) 500 MG tablet Take 500 mg by mouth every 6 (six) hours as needed for moderate pain (pain).    . ferrous sulfate 325 (65 FE) MG tablet Take 325 mg by mouth 2 (two) times  daily with a meal.    . HYDROcodone-acetaminophen (NORCO/VICODIN) 5-325 MG tablet Take 1 tablet by mouth every 4 (four) hours as needed for moderate pain. 40 tablet 0  . insulin glargine (LANTUS) 100 UNIT/ML injection Inject 15 Units into the skin daily. Takes at 1300     No current facility-administered medications for this visit.     PHYSICAL EXAMINATION: ECOG PERFORMANCE STATUS: 2 - Symptomatic, <50% confined to bed  Vitals:   10/11/17 1540  BP: (!) 157/68  Pulse: 66  Resp: 18  Temp: 98.6 F (37 C)  SpO2: 100%   Filed Weights   10/11/17 1540  Weight: 130 lb 1.6 oz (59 kg)    GENERAL:alert, no distress and comfortable SKIN: skin color, texture, turgor are normal, no rashes or significant lesions EYES: normal, Conjunctiva are pink and non-injected, sclera clear OROPHARYNX:no exudate, no erythema and lips, buccal mucosa, and tongue normal  NECK: supple, thyroid normal size, non-tender, without nodularity LYMPH:  no palpable lymphadenopathy in the cervical, axillary or inguinal LUNGS: clear to auscultation and percussion with normal breathing effort HEART: regular rate & rhythm and no murmurs and no lower extremity edema ABDOMEN:abdomen soft, non-tender and normal bowel sounds MUSCULOSKELETAL:no cyanosis of digits and no clubbing  NEURO: alert & oriented x 3 with fluent speech, no focal motor/sensory deficits EXTREMITIES: No lower extremity edema   LABORATORY DATA:  I have reviewed the data as listed CMP Latest Ref Rng & Units 08/14/2016 05/15/2016 05/14/2016  Glucose 65 - 99 mg/dL 92 110(H) 122(H)  BUN 6 - 20 mg/dL 29(H) 6 7  Creatinine 0.44 - 1.00 mg/dL 0.91 0.75 0.63  Sodium 135 - 145 mmol/L 139 139 138  Potassium 3.5 - 5.1 mmol/L  3.4(L) 3.0(L) 3.0(L)  Chloride 101 - 111 mmol/L 111 102 103  CO2 22 - 32 mmol/L 19(L) 31 29  Calcium 8.9 - 10.3 mg/dL 8.9 7.6(L) 7.5(L)  Total Protein 6.5 - 8.1 g/dL 7.1 - -  Total Bilirubin 0.3 - 1.2 mg/dL 0.6 - -  Alkaline Phos 38 - 126  U/L 75 - -  AST 15 - 41 U/L 20 - -  ALT 14 - 54 U/L 14 - -    Lab Results  Component Value Date   WBC 8.0 07/22/2017   HGB 10.0 (L) 07/22/2017   HCT 31.4 (L) 07/22/2017   MCV 92.1 07/22/2017   PLT 195 07/22/2017   NEUTROABS 3.5 07/08/2017    ASSESSMENT & PLAN:  Normocytic anemia: Anemia of chronic kidney disease Lab review 09/28/2017: B12 551 Folate 11 Hemoglobin 8.8 and hematocrit 28, MCV 92.7, platelet count 177 Creatinine 1.42, BUN 48, LFTs are normal  Bone marrow biopsy 07/22/2017: Normocellular marrow with trilineage hematopoiesis with maturation, mild plasmacytosis 5 to 10%, polyclonal likely reactive, cytogenetics normal, flow cytometry normal  Based upon the above data, I suggest starting the patient on erythropoietin stimulating agents to get the hemoglobin above 10 g.  I believe the cause of anemia is anemia of chronic kidney disease. Before we start Aranesp therapy, I recommended that we obtain iron studies.  She will receive lab work every time she receives Aranesp.  Return to clinic in 9 weeks to recheck labs and follow-up.    No orders of the defined types were placed in this encounter.  The patient has a good understanding of the overall plan. she agrees with it. she will call with any problems that may develop before the next visit here.   Harriette Ohara, MD 10/11/17

## 2017-10-12 LAB — IRON AND TIBC
IRON: 45 ug/dL (ref 41–142)
Saturation Ratios: 19 % — ABNORMAL LOW (ref 21–57)
TIBC: 233 ug/dL — AB (ref 236–444)
UIBC: 188 ug/dL

## 2017-10-12 LAB — FERRITIN: Ferritin: 193 ng/mL (ref 11–307)

## 2017-10-14 ENCOUNTER — Other Ambulatory Visit: Payer: Self-pay | Admitting: Hematology and Oncology

## 2017-10-18 ENCOUNTER — Inpatient Hospital Stay: Payer: Medicare HMO

## 2017-10-18 VITALS — BP 141/65 | HR 71 | Temp 98.6°F | Resp 20

## 2017-10-18 DIAGNOSIS — N189 Chronic kidney disease, unspecified: Secondary | ICD-10-CM | POA: Diagnosis not present

## 2017-10-18 DIAGNOSIS — D631 Anemia in chronic kidney disease: Secondary | ICD-10-CM | POA: Diagnosis not present

## 2017-10-18 DIAGNOSIS — E538 Deficiency of other specified B group vitamins: Secondary | ICD-10-CM | POA: Diagnosis not present

## 2017-10-18 DIAGNOSIS — Z862 Personal history of diseases of the blood and blood-forming organs and certain disorders involving the immune mechanism: Secondary | ICD-10-CM | POA: Diagnosis not present

## 2017-10-18 MED ORDER — DARBEPOETIN ALFA 300 MCG/0.6ML IJ SOSY
300.0000 ug | PREFILLED_SYRINGE | Freq: Once | INTRAMUSCULAR | Status: AC
  Administered 2017-10-18: 300 ug via SUBCUTANEOUS

## 2017-10-18 NOTE — Patient Instructions (Signed)

## 2017-10-22 ENCOUNTER — Other Ambulatory Visit: Payer: Self-pay

## 2017-10-22 ENCOUNTER — Other Ambulatory Visit: Payer: Self-pay | Admitting: Hematology and Oncology

## 2017-10-22 NOTE — Progress Notes (Signed)
Per Dr.Gudena, pt will get vitamin b12 shots and retacrit every 3 weeks.

## 2017-10-27 ENCOUNTER — Ambulatory Visit: Payer: Medicare HMO

## 2017-11-08 ENCOUNTER — Inpatient Hospital Stay: Payer: Medicare HMO

## 2017-11-08 ENCOUNTER — Inpatient Hospital Stay: Payer: Medicare HMO | Attending: Hematology and Oncology

## 2017-11-08 VITALS — BP 148/62 | HR 72

## 2017-11-08 DIAGNOSIS — N189 Chronic kidney disease, unspecified: Secondary | ICD-10-CM

## 2017-11-08 DIAGNOSIS — E538 Deficiency of other specified B group vitamins: Secondary | ICD-10-CM | POA: Diagnosis not present

## 2017-11-08 DIAGNOSIS — D631 Anemia in chronic kidney disease: Secondary | ICD-10-CM | POA: Insufficient documentation

## 2017-11-08 DIAGNOSIS — D649 Anemia, unspecified: Secondary | ICD-10-CM

## 2017-11-08 DIAGNOSIS — Z862 Personal history of diseases of the blood and blood-forming organs and certain disorders involving the immune mechanism: Secondary | ICD-10-CM

## 2017-11-08 LAB — CBC WITH DIFFERENTIAL (CANCER CENTER ONLY)
BASOS ABS: 0 10*3/uL (ref 0.0–0.1)
BASOS PCT: 1 %
EOS ABS: 0.3 10*3/uL (ref 0.0–0.5)
Eosinophils Relative: 6 %
HCT: 32.1 % — ABNORMAL LOW (ref 34.8–46.6)
HEMOGLOBIN: 10.3 g/dL — AB (ref 11.6–15.9)
Lymphocytes Relative: 44 %
Lymphs Abs: 2.7 10*3/uL (ref 0.9–3.3)
MCH: 29.1 pg (ref 25.1–34.0)
MCHC: 32.1 g/dL (ref 31.5–36.0)
MCV: 90.7 fL (ref 79.5–101.0)
MONOS PCT: 5 %
Monocytes Absolute: 0.3 10*3/uL (ref 0.1–0.9)
Neutro Abs: 2.7 10*3/uL (ref 1.5–6.5)
Neutrophils Relative %: 44 %
Platelet Count: 139 10*3/uL — ABNORMAL LOW (ref 145–400)
RBC: 3.54 MIL/uL — AB (ref 3.70–5.45)
RDW: 14.3 % (ref 11.2–14.5)
WBC: 6.1 10*3/uL (ref 3.9–10.3)

## 2017-11-08 MED ORDER — EPOETIN ALFA-EPBX 40000 UNIT/ML IJ SOLN
40000.0000 [IU] | Freq: Once | INTRAMUSCULAR | Status: AC
  Administered 2017-11-08: 40000 [IU] via SUBCUTANEOUS
  Filled 2017-11-08: qty 1

## 2017-11-08 MED ORDER — CYANOCOBALAMIN 1000 MCG/ML IJ SOLN
1000.0000 ug | Freq: Once | INTRAMUSCULAR | Status: AC
  Administered 2017-11-08: 1000 ug via INTRAMUSCULAR

## 2017-11-08 NOTE — Patient Instructions (Signed)
Cyanocobalamin, Vitamin B12 injection What is this medicine? CYANOCOBALAMIN (sye an oh koe BAL a min) is a man made form of vitamin B12. Vitamin B12 is used in the growth of healthy blood cells, nerve cells, and proteins in the body. It also helps with the metabolism of fats and carbohydrates. This medicine is used to treat people who can not absorb vitamin B12. This medicine may be used for other purposes; ask your health care provider or pharmacist if you have questions. COMMON BRAND NAME(S): B-12 Compliance Kit, B-12 Injection Kit, Cyomin, LA-12, Nutri-Twelve, Physicians EZ Use B-12, Primabalt What should I tell my health care provider before I take this medicine? They need to know if you have any of these conditions: -kidney disease -Leber's disease -megaloblastic anemia -an unusual or allergic reaction to cyanocobalamin, cobalt, other medicines, foods, dyes, or preservatives -pregnant or trying to get pregnant -breast-feeding How should I use this medicine? This medicine is injected into a muscle or deeply under the skin. It is usually given by a health care professional in a clinic or doctor's office. However, your doctor may teach you how to inject yourself. Follow all instructions. Talk to your pediatrician regarding the use of this medicine in children. Special care may be needed. Overdosage: If you think you have taken too much of this medicine contact a poison control center or emergency room at once. NOTE: This medicine is only for you. Do not share this medicine with others. What if I miss a dose? If you are given your dose at a clinic or doctor's office, call to reschedule your appointment. If you give your own injections and you miss a dose, take it as soon as you can. If it is almost time for your next dose, take only that dose. Do not take double or extra doses. What may interact with this medicine? -colchicine -heavy alcohol intake This list may not describe all possible  interactions. Give your health care provider a list of all the medicines, herbs, non-prescription drugs, or dietary supplements you use. Also tell them if you smoke, drink alcohol, or use illegal drugs. Some items may interact with your medicine. What should I watch for while using this medicine? Visit your doctor or health care professional regularly. You may need blood work done while you are taking this medicine. You may need to follow a special diet. Talk to your doctor. Limit your alcohol intake and avoid smoking to get the best benefit. What side effects may I notice from receiving this medicine? Side effects that you should report to your doctor or health care professional as soon as possible: -allergic reactions like skin rash, itching or hives, swelling of the face, lips, or tongue -blue tint to skin -chest tightness, pain -difficulty breathing, wheezing -dizziness -red, swollen painful area on the leg Side effects that usually do not require medical attention (report to your doctor or health care professional if they continue or are bothersome): -diarrhea -headache This list may not describe all possible side effects. Call your doctor for medical advice about side effects. You may report side effects to FDA at 1-800-FDA-1088. Where should I keep my medicine? Keep out of the reach of children. Store at room temperature between 15 and 30 degrees C (59 and 85 degrees F). Protect from light. Throw away any unused medicine after the expiration date. NOTE: This sheet is a summary. It may not cover all possible information. If you have questions about this medicine, talk to your doctor, pharmacist, or   health care provider.  2018 Elsevier/Gold Standard (2007-05-23 22:10:20) Epoetin Alfa injection What is this medicine? EPOETIN ALFA (e POE e tin AL fa) helps your body make more red blood cells. This medicine is used to treat anemia caused by chronic kidney failure, cancer chemotherapy, or  HIV-therapy. It may also be used before surgery if you have anemia. This medicine may be used for other purposes; ask your health care provider or pharmacist if you have questions. COMMON BRAND NAME(S): Epogen, Procrit What should I tell my health care provider before I take this medicine? They need to know if you have any of these conditions: -blood clotting disorders -cancer patient not on chemotherapy -cystic fibrosis -heart disease, such as angina or heart failure -hemoglobin level of 12 g/dL or greater -high blood pressure -low levels of folate, iron, or vitamin B12 -seizures -an unusual or allergic reaction to erythropoietin, albumin, benzyl alcohol, hamster proteins, other medicines, foods, dyes, or preservatives -pregnant or trying to get pregnant -breast-feeding How should I use this medicine? This medicine is for injection into a vein or under the skin. It is usually given by a health care professional in a hospital or clinic setting. If you get this medicine at home, you will be taught how to prepare and give this medicine. Use exactly as directed. Take your medicine at regular intervals. Do not take your medicine more often than directed. It is important that you put your used needles and syringes in a special sharps container. Do not put them in a trash can. If you do not have a sharps container, call your pharmacist or healthcare provider to get one. A special MedGuide will be given to you by the pharmacist with each prescription and refill. Be sure to read this information carefully each time. Talk to your pediatrician regarding the use of this medicine in children. While this drug may be prescribed for selected conditions, precautions do apply. Overdosage: If you think you have taken too much of this medicine contact a poison control center or emergency room at once. NOTE: This medicine is only for you. Do not share this medicine with others. What if I miss a dose? If you  miss a dose, take it as soon as you can. If it is almost time for your next dose, take only that dose. Do not take double or extra doses. What may interact with this medicine? Do not take this medicine with any of the following medications: -darbepoetin alfa This list may not describe all possible interactions. Give your health care provider a list of all the medicines, herbs, non-prescription drugs, or dietary supplements you use. Also tell them if you smoke, drink alcohol, or use illegal drugs. Some items may interact with your medicine. What should I watch for while using this medicine? Your condition will be monitored carefully while you are receiving this medicine. You may need blood work done while you are taking this medicine. What side effects may I notice from receiving this medicine? Side effects that you should report to your doctor or health care professional as soon as possible: -allergic reactions like skin rash, itching or hives, swelling of the face, lips, or tongue -breathing problems -changes in vision -chest pain -confusion, trouble speaking or understanding -feeling faint or lightheaded, falls -high blood pressure -muscle aches or pains -pain, swelling, warmth in the leg -rapid weight gain -severe headaches -sudden numbness or weakness of the face, arm or leg -trouble walking, dizziness, loss of balance or coordination -seizures (convulsions) -  swelling of the ankles, feet, hands -unusually weak or tired Side effects that usually do not require medical attention (report to your doctor or health care professional if they continue or are bothersome): -diarrhea -fever, chills (flu-like symptoms) -headaches -nausea, vomiting -redness, stinging, or swelling at site where injected This list may not describe all possible side effects. Call your doctor for medical advice about side effects. You may report side effects to FDA at 1-800-FDA-1088. Where should I keep my  medicine? Keep out of the reach of children. Store in a refrigerator between 2 and 8 degrees C (36 and 46 degrees F). Do not freeze or shake. Throw away any unused portion if using a single-dose vial. Multi-dose vials can be kept in the refrigerator for up to 21 days after the initial dose. Throw away unused medicine. NOTE: This sheet is a summary. It may not cover all possible information. If you have questions about this medicine, talk to your doctor, pharmacist, or health care provider.  2018 Elsevier/Gold Standard (2015-09-30 19:42:31)

## 2017-11-24 ENCOUNTER — Ambulatory Visit: Payer: Medicare HMO

## 2017-11-26 ENCOUNTER — Encounter: Payer: Self-pay | Admitting: Pharmacy Technician

## 2017-11-26 ENCOUNTER — Telehealth: Payer: Self-pay

## 2017-11-26 DIAGNOSIS — D649 Anemia, unspecified: Secondary | ICD-10-CM

## 2017-11-26 NOTE — Progress Notes (Deleted)
The patient is approved for drug assistance by [employer] for Aranesp 300 mcg.  Enrollment is until 02/25/18 and is based on insurance denial.  Drug replacement will be requested for DOS 10/18/17.

## 2017-11-26 NOTE — Telephone Encounter (Signed)
CAlled and lvm to notify pt that a lab appt will be needed prior to her injection appt on Monday. Labs added for 3pm and inj appt 330pm 11/29/17.

## 2017-11-29 ENCOUNTER — Inpatient Hospital Stay: Payer: Medicare HMO

## 2017-11-29 ENCOUNTER — Inpatient Hospital Stay: Payer: Medicare HMO | Attending: Hematology and Oncology

## 2017-11-29 DIAGNOSIS — D649 Anemia, unspecified: Secondary | ICD-10-CM | POA: Diagnosis not present

## 2017-11-29 DIAGNOSIS — D631 Anemia in chronic kidney disease: Secondary | ICD-10-CM | POA: Insufficient documentation

## 2017-11-29 DIAGNOSIS — Z794 Long term (current) use of insulin: Secondary | ICD-10-CM | POA: Insufficient documentation

## 2017-11-29 DIAGNOSIS — N189 Chronic kidney disease, unspecified: Secondary | ICD-10-CM | POA: Diagnosis not present

## 2017-11-29 LAB — CBC WITH DIFFERENTIAL (CANCER CENTER ONLY)
Basophils Absolute: 0 10*3/uL (ref 0.0–0.1)
Basophils Relative: 1 %
Eosinophils Absolute: 0.2 10*3/uL (ref 0.0–0.5)
Eosinophils Relative: 6 %
HCT: 33.3 % — ABNORMAL LOW (ref 34.8–46.6)
Hemoglobin: 10.7 g/dL — ABNORMAL LOW (ref 11.6–15.9)
Lymphocytes Relative: 26 %
Lymphs Abs: 1 10*3/uL (ref 0.9–3.3)
MCH: 28.3 pg (ref 25.1–34.0)
MCHC: 32.2 g/dL (ref 31.5–36.0)
MCV: 87.9 fL (ref 79.5–101.0)
MONOS PCT: 8 %
Monocytes Absolute: 0.3 10*3/uL (ref 0.1–0.9)
NEUTROS ABS: 2.2 10*3/uL (ref 1.5–6.5)
NEUTROS PCT: 59 %
PLATELETS: 160 10*3/uL (ref 145–400)
RBC: 3.79 MIL/uL (ref 3.70–5.45)
RDW: 14.9 % — ABNORMAL HIGH (ref 11.2–14.5)
WBC Count: 3.8 10*3/uL — ABNORMAL LOW (ref 3.9–10.3)

## 2017-11-29 NOTE — Progress Notes (Signed)
Hemoglobin noted at 10.7. Results discussed with Dr. Lindi Adie. Injection held today per Dr. Geralyn Flash  Request. Pt given copy of current results and current schedule. Instructed to follow schedule and call our office should issues occur.

## 2017-12-07 DIAGNOSIS — E113413 Type 2 diabetes mellitus with severe nonproliferative diabetic retinopathy with macular edema, bilateral: Secondary | ICD-10-CM | POA: Diagnosis not present

## 2017-12-07 DIAGNOSIS — H2511 Age-related nuclear cataract, right eye: Secondary | ICD-10-CM | POA: Diagnosis not present

## 2017-12-07 DIAGNOSIS — Z794 Long term (current) use of insulin: Secondary | ICD-10-CM | POA: Diagnosis not present

## 2017-12-07 DIAGNOSIS — IMO0002 Reserved for concepts with insufficient information to code with codable children: Secondary | ICD-10-CM

## 2017-12-20 ENCOUNTER — Telehealth: Payer: Self-pay | Admitting: Hematology and Oncology

## 2017-12-20 ENCOUNTER — Inpatient Hospital Stay: Payer: Medicare HMO | Admitting: Hematology and Oncology

## 2017-12-20 ENCOUNTER — Inpatient Hospital Stay: Payer: Medicare HMO

## 2017-12-20 MED ORDER — EPOETIN ALFA 40000 UNIT/ML IJ SOLN
INTRAMUSCULAR | Status: AC
  Filled 2017-12-20: qty 1

## 2017-12-20 MED ORDER — CYANOCOBALAMIN 1000 MCG/ML IJ SOLN
INTRAMUSCULAR | Status: AC
  Filled 2017-12-20: qty 1

## 2017-12-20 NOTE — Assessment & Plan Note (Deleted)
Lab review: 11/29/2017: Hemoglobin 10.7, WBC 3.8, RDW 14.9 Bone marrow biopsy 07/22/2017: Normocellular marrow with trilineage hematopoiesis with maturation, mild plasmacytosis 5 to 10%, polyclonal likely reactive, cytogenetics normal, flow cytometry normal  Current treatment: 1.  Retacrit 40000 Units Injection 11/08/2017: Today's hemoglobin. 2. return to clinic in 1 month with recheck of labs and follow-up.

## 2017-12-20 NOTE — Telephone Encounter (Signed)
R/s appt per 10/28 sch message - pt is aware of appt date and time.

## 2017-12-21 ENCOUNTER — Inpatient Hospital Stay: Payer: Medicare HMO

## 2017-12-21 ENCOUNTER — Inpatient Hospital Stay (HOSPITAL_BASED_OUTPATIENT_CLINIC_OR_DEPARTMENT_OTHER): Payer: Medicare HMO | Admitting: Hematology and Oncology

## 2017-12-21 VITALS — BP 158/74 | HR 74

## 2017-12-21 DIAGNOSIS — D649 Anemia, unspecified: Secondary | ICD-10-CM | POA: Diagnosis not present

## 2017-12-21 DIAGNOSIS — Z862 Personal history of diseases of the blood and blood-forming organs and certain disorders involving the immune mechanism: Secondary | ICD-10-CM

## 2017-12-21 DIAGNOSIS — N189 Chronic kidney disease, unspecified: Secondary | ICD-10-CM

## 2017-12-21 DIAGNOSIS — E538 Deficiency of other specified B group vitamins: Secondary | ICD-10-CM | POA: Diagnosis not present

## 2017-12-21 DIAGNOSIS — D631 Anemia in chronic kidney disease: Secondary | ICD-10-CM | POA: Diagnosis not present

## 2017-12-21 DIAGNOSIS — Z794 Long term (current) use of insulin: Secondary | ICD-10-CM | POA: Diagnosis not present

## 2017-12-21 LAB — CBC WITH DIFFERENTIAL (CANCER CENTER ONLY)
ABS IMMATURE GRANULOCYTES: 0.02 10*3/uL (ref 0.00–0.07)
Basophils Absolute: 0 10*3/uL (ref 0.0–0.1)
Basophils Relative: 1 %
EOS PCT: 4 %
Eosinophils Absolute: 0.3 10*3/uL (ref 0.0–0.5)
HCT: 32.1 % — ABNORMAL LOW (ref 36.0–46.0)
HEMOGLOBIN: 10.2 g/dL — AB (ref 12.0–15.0)
Immature Granulocytes: 0 %
LYMPHS PCT: 40 %
Lymphs Abs: 2.6 10*3/uL (ref 0.7–4.0)
MCH: 28.7 pg (ref 26.0–34.0)
MCHC: 31.8 g/dL (ref 30.0–36.0)
MCV: 90.4 fL (ref 80.0–100.0)
MONO ABS: 0.4 10*3/uL (ref 0.1–1.0)
MONOS PCT: 7 %
NEUTROS ABS: 3.2 10*3/uL (ref 1.7–7.7)
Neutrophils Relative %: 48 %
Platelet Count: 135 10*3/uL — ABNORMAL LOW (ref 150–400)
RBC: 3.55 MIL/uL — ABNORMAL LOW (ref 3.87–5.11)
RDW: 14.2 % (ref 11.5–15.5)
WBC Count: 6.6 10*3/uL (ref 4.0–10.5)
nRBC: 0 % (ref 0.0–0.2)

## 2017-12-21 MED ORDER — EPOETIN ALFA-EPBX 40000 UNIT/ML IJ SOLN
40000.0000 [IU] | Freq: Once | INTRAMUSCULAR | Status: AC
  Administered 2017-12-21: 40000 [IU] via SUBCUTANEOUS
  Filled 2017-12-21: qty 1

## 2017-12-21 NOTE — Assessment & Plan Note (Signed)
Lab review 11/29/2017: Hemoglobin 10.7: Current treatment: Retacrit 40,000 Units and B 12 started 11/08/17  Bone marrow biopsy 07/22/2017: Normocellular marrow with trilineage hematopoiesis with maturation, mild plasmacytosis 5 to 10%, polyclonal likely reactive, cytogenetics normal, flow cytometry normal

## 2017-12-21 NOTE — Progress Notes (Signed)
Patient Care Team: [name], MD as PCP - General (Family Medicine) Michael Boston, MD as Consulting Physician (General Surgery) Sydnee Cabal, MD as Consulting Physician (Orthopedic Surgery)  DIAGNOSIS:    ICD-10-CM   1. Normocytic anemia D64.9     CHIEF COMPLIANT:  Follow-up of normocytic anemia also getting B12 injections  INTERVAL HISTORY: Bailey [name] is a 67 y.o. with above-mentioned history of normocytic anemia along with chronic kidney disease. She is accompanied by her husband today. Recent blood work showed: RBC at 3.55, Hgb at 10.2, and HCT at 32.1. She denies fatigue. 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    REVIEW OF SYSTEMS:   Constitutional: Denies fevers, chills or abnormal weight loss Eyes: Denies blurriness of vision Ears, nose, mouth, throat, and face: Denies mucositis or sore throat Respiratory: Denies cough, dyspnea or wheezes Cardiovascular: Denies palpitation, chest discomfort Gastrointestinal:  Denies nausea, heartburn or change in bowel habits Skin: Denies abnormal skin rashes Lymphatics: Denies new lymphadenopathy or easy bruising Neurological:Denies numbness, tingling or new weaknesses Behavioral/Psych: Mood is stable, no new changes  Extremities: No lower extremity edema  All other systems were reviewed with the patient and are negative.  I have reviewed the past medical history, past surgical history, social history and family history with the patient and they are unchanged from previous note.  ALLERGIES:  is allergic to no known allergies.  MEDICATIONS:  Current Outpatient Medications  Medication Sig Dispense Refill  . acetaminophen (TYLENOL) 500 MG tablet Take 500 mg by mouth every 6 (six) hours as needed for moderate pain (pain).    . ferrous sulfate 325 (65 FE) MG tablet Take 325 mg by mouth 2 (two) times daily with a meal.     . HYDROcodone-acetaminophen (NORCO/VICODIN) 5-325 MG tablet Take 1 tablet by mouth every 4 (four) hours as needed for moderate pain. 40 tablet 0  . insulin glargine (LANTUS) 100 UNIT/ML injection Inject 15 Units into the skin daily. Takes at 1300     No current facility-administered medications for this visit.     PHYSICAL EXAMINATION: ECOG PERFORMANCE STATUS: 1 - Symptomatic but completely ambulatory  Vitals:   12/21/17 1428  BP: (!) 187/70  Pulse: 69  Resp: 17  Temp: 98.3 F (36.8 C)  SpO2: 100%   Filed Weights   12/21/17 1428  Weight: 133 lb 12.8 oz (60.7 kg)    GENERAL:alert, no distress and comfortable SKIN: skin color, texture, turgor are normal, no rashes or significant lesions EYES: normal, Conjunctiva are pink and non-injected, sclera clear OROPHARYNX:no exudate, no erythema and lips, buccal mucosa, and tongue normal  NECK: supple, thyroid normal size, non-tender, without nodularity LYMPH:  no palpable lymphadenopathy in the cervical, axillary or inguinal LUNGS: clear to auscultation and percussion with normal breathing effort HEART: regular rate & rhythm and no murmurs and no lower extremity edema ABDOMEN:abdomen soft, non-tender and normal bowel sounds MUSCULOSKELETAL:no cyanosis of digits and no clubbing  NEURO: alert & oriented x 3 with fluent speech, no focal motor/sensory deficits EXTREMITIES: No lower extremity edema   LABORATORY DATA:  I have reviewed the data as listed CMP Latest Ref Rng & Units 08/14/2016 05/15/2016 05/14/2016  Glucose 65 - 99 mg/dL 92 110(H) 122(H)  BUN 6 - 20 mg/dL 29(H) 6 7  Creatinine 0.44 - 1.00 mg/dL 0.91 0.75 0.63  Sodium 135 - 145 mmol/L 139 139 138  Potassium 3.5 - 5.1 mmol/L 3.4(L) 3.0(L) 3.0(L)  Chloride 101 - 111 mmol/L 111 102 103  CO2 22 - 32 mmol/L 19(L) 31 29  Calcium 8.9 - 10.3 mg/dL 8.9 7.6(L) 7.5(L)  Total Protein 6.5 - 8.1 g/dL 7.1 - -  Total Bilirubin 0.3 - 1.2 mg/dL 0.6 - -  Alkaline Phos 38 - 126 U/L 75 - -    AST 15 - 41 U/L 20 - -  ALT 14 - 54 U/L 14 - -    Lab Results  Component Value Date   WBC 6.6 12/21/2017   HGB 10.2 (L) 12/21/2017   HCT 32.1 (L) 12/21/2017   MCV 90.4 12/21/2017   PLT 135 (L) 12/21/2017   NEUTROABS 3.2 12/21/2017    ASSESSMENT & PLAN:  Normocytic anemia Lab review 11/29/2017: Hemoglobin 10.7 Today's hemoglobin is 10.2 I recommended that she received retacrit injection along with B12 today Current treatment: Retacrit 40,000 Units and B 12 started 11/08/17  Bone marrow biopsy 07/22/2017: Normocellular marrow with trilineage hematopoiesis with maturation, mild plasmacytosis 5 to 10%, polyclonal likely reactive, cytogenetics normal, flow cytometry normal   Return to clinic monthly for labs check.  Injections monthly for B12 Follow-up in 2 months with labs   No orders of the defined types were placed in this encounter.  The patient has a good understanding of the overall plan. she agrees with it. she will call with any problems that may develop before the next visit here.  Nicholas Lose, MD 12/21/2017  I, Soijett Blue am acting as scribe for Dr. Nicholas Lose.  I have reviewed the above documentation for accuracy and completeness, and I agree with the above.

## 2017-12-21 NOTE — Progress Notes (Signed)
Hemoglobin noted at  10.2. Per Dr. Lindi Adie, give Retacrit 40,000 units as ordred.

## 2017-12-21 NOTE — Patient Instructions (Signed)

## 2017-12-22 ENCOUNTER — Telehealth: Payer: Self-pay | Admitting: Hematology and Oncology

## 2017-12-22 ENCOUNTER — Ambulatory Visit: Payer: Medicare HMO

## 2017-12-22 NOTE — Telephone Encounter (Signed)
Spoke with patients husband, and he asked me to mail the appointment calendar.

## 2017-12-23 DIAGNOSIS — Z794 Long term (current) use of insulin: Secondary | ICD-10-CM | POA: Diagnosis not present

## 2017-12-23 DIAGNOSIS — E113413 Type 2 diabetes mellitus with severe nonproliferative diabetic retinopathy with macular edema, bilateral: Secondary | ICD-10-CM | POA: Diagnosis not present

## 2018-01-06 DIAGNOSIS — E119 Type 2 diabetes mellitus without complications: Secondary | ICD-10-CM | POA: Diagnosis not present

## 2018-01-06 DIAGNOSIS — D649 Anemia, unspecified: Secondary | ICD-10-CM | POA: Diagnosis not present

## 2018-01-06 DIAGNOSIS — N289 Disorder of kidney and ureter, unspecified: Secondary | ICD-10-CM | POA: Diagnosis not present

## 2018-01-06 DIAGNOSIS — D509 Iron deficiency anemia, unspecified: Secondary | ICD-10-CM | POA: Diagnosis not present

## 2018-01-10 ENCOUNTER — Inpatient Hospital Stay: Payer: Medicare HMO | Attending: Hematology and Oncology

## 2018-01-10 ENCOUNTER — Inpatient Hospital Stay: Payer: Medicare HMO

## 2018-01-10 VITALS — BP 158/77 | HR 66

## 2018-01-10 DIAGNOSIS — N189 Chronic kidney disease, unspecified: Secondary | ICD-10-CM | POA: Diagnosis not present

## 2018-01-10 DIAGNOSIS — D631 Anemia in chronic kidney disease: Secondary | ICD-10-CM | POA: Insufficient documentation

## 2018-01-10 DIAGNOSIS — E538 Deficiency of other specified B group vitamins: Secondary | ICD-10-CM | POA: Insufficient documentation

## 2018-01-10 DIAGNOSIS — D649 Anemia, unspecified: Secondary | ICD-10-CM

## 2018-01-10 DIAGNOSIS — Z862 Personal history of diseases of the blood and blood-forming organs and certain disorders involving the immune mechanism: Secondary | ICD-10-CM

## 2018-01-10 LAB — CBC WITH DIFFERENTIAL (CANCER CENTER ONLY)
ABS IMMATURE GRANULOCYTES: 0.02 10*3/uL (ref 0.00–0.07)
BASOS ABS: 0.1 10*3/uL (ref 0.0–0.1)
BASOS PCT: 1 %
Eosinophils Absolute: 0.3 10*3/uL (ref 0.0–0.5)
Eosinophils Relative: 5 %
HCT: 33.4 % — ABNORMAL LOW (ref 36.0–46.0)
HEMOGLOBIN: 10.1 g/dL — AB (ref 12.0–15.0)
Immature Granulocytes: 0 %
LYMPHS PCT: 40 %
Lymphs Abs: 2.4 10*3/uL (ref 0.7–4.0)
MCH: 27.4 pg (ref 26.0–34.0)
MCHC: 30.2 g/dL (ref 30.0–36.0)
MCV: 90.8 fL (ref 80.0–100.0)
Monocytes Absolute: 0.4 10*3/uL (ref 0.1–1.0)
Monocytes Relative: 6 %
NEUTROS ABS: 2.9 10*3/uL (ref 1.7–7.7)
NEUTROS PCT: 48 %
NRBC: 0 % (ref 0.0–0.2)
PLATELETS: 137 10*3/uL — AB (ref 150–400)
RBC: 3.68 MIL/uL — ABNORMAL LOW (ref 3.87–5.11)
RDW: 14.5 % (ref 11.5–15.5)
WBC: 5.9 10*3/uL (ref 4.0–10.5)

## 2018-01-10 MED ORDER — CYANOCOBALAMIN 1000 MCG/ML IJ SOLN
INTRAMUSCULAR | Status: AC
  Filled 2018-01-10: qty 1

## 2018-01-10 MED ORDER — EPOETIN ALFA-EPBX 40000 UNIT/ML IJ SOLN
40000.0000 [IU] | Freq: Once | INTRAMUSCULAR | Status: AC
  Administered 2018-01-10: 40000 [IU] via SUBCUTANEOUS
  Filled 2018-01-10: qty 1

## 2018-01-10 MED ORDER — CYANOCOBALAMIN 1000 MCG/ML IJ SOLN
1000.0000 ug | Freq: Once | INTRAMUSCULAR | Status: AC
  Administered 2018-01-10: 1000 ug via INTRAMUSCULAR

## 2018-01-19 ENCOUNTER — Ambulatory Visit: Payer: Medicare HMO

## 2018-01-24 DIAGNOSIS — D649 Anemia, unspecified: Secondary | ICD-10-CM | POA: Diagnosis not present

## 2018-01-24 DIAGNOSIS — N289 Disorder of kidney and ureter, unspecified: Secondary | ICD-10-CM | POA: Diagnosis not present

## 2018-01-24 DIAGNOSIS — E1142 Type 2 diabetes mellitus with diabetic polyneuropathy: Secondary | ICD-10-CM | POA: Diagnosis not present

## 2018-01-24 DIAGNOSIS — Z6823 Body mass index (BMI) 23.0-23.9, adult: Secondary | ICD-10-CM | POA: Diagnosis not present

## 2018-01-24 DIAGNOSIS — Z23 Encounter for immunization: Secondary | ICD-10-CM | POA: Diagnosis not present

## 2018-01-27 DIAGNOSIS — Z794 Long term (current) use of insulin: Secondary | ICD-10-CM | POA: Diagnosis not present

## 2018-01-27 DIAGNOSIS — E113413 Type 2 diabetes mellitus with severe nonproliferative diabetic retinopathy with macular edema, bilateral: Secondary | ICD-10-CM | POA: Diagnosis not present

## 2018-01-28 ENCOUNTER — Telehealth: Payer: Self-pay | Admitting: Hematology and Oncology

## 2018-01-28 NOTE — Telephone Encounter (Signed)
R/s appt per 12/6 sch message - pt is aware of apt change.

## 2018-01-31 ENCOUNTER — Telehealth: Payer: Self-pay | Admitting: Hematology and Oncology

## 2018-01-31 ENCOUNTER — Ambulatory Visit: Payer: Medicare HMO

## 2018-01-31 ENCOUNTER — Inpatient Hospital Stay: Payer: Medicare HMO | Attending: Hematology and Oncology

## 2018-01-31 ENCOUNTER — Other Ambulatory Visit: Payer: Medicare HMO

## 2018-01-31 ENCOUNTER — Ambulatory Visit: Payer: Medicare HMO | Admitting: Adult Health

## 2018-01-31 ENCOUNTER — Inpatient Hospital Stay: Payer: Medicare HMO

## 2018-01-31 ENCOUNTER — Inpatient Hospital Stay (HOSPITAL_BASED_OUTPATIENT_CLINIC_OR_DEPARTMENT_OTHER): Payer: Medicare HMO | Admitting: Hematology and Oncology

## 2018-01-31 ENCOUNTER — Other Ambulatory Visit: Payer: Self-pay

## 2018-01-31 VITALS — BP 207/85 | HR 70

## 2018-01-31 VITALS — BP 198/78 | HR 72 | Temp 98.0°F | Resp 18 | Ht 64.0 in | Wt 134.3 lb

## 2018-01-31 DIAGNOSIS — E538 Deficiency of other specified B group vitamins: Secondary | ICD-10-CM

## 2018-01-31 DIAGNOSIS — D649 Anemia, unspecified: Secondary | ICD-10-CM

## 2018-01-31 DIAGNOSIS — I10 Essential (primary) hypertension: Secondary | ICD-10-CM | POA: Diagnosis not present

## 2018-01-31 DIAGNOSIS — Z862 Personal history of diseases of the blood and blood-forming organs and certain disorders involving the immune mechanism: Secondary | ICD-10-CM

## 2018-01-31 DIAGNOSIS — N189 Chronic kidney disease, unspecified: Secondary | ICD-10-CM

## 2018-01-31 LAB — CBC WITH DIFFERENTIAL (CANCER CENTER ONLY)
Abs Immature Granulocytes: 0.01 10*3/uL (ref 0.00–0.07)
Basophils Absolute: 0 10*3/uL (ref 0.0–0.1)
Basophils Relative: 1 %
Eosinophils Absolute: 0.3 10*3/uL (ref 0.0–0.5)
Eosinophils Relative: 5 %
HCT: 34.4 % — ABNORMAL LOW (ref 36.0–46.0)
Hemoglobin: 10.7 g/dL — ABNORMAL LOW (ref 12.0–15.0)
Immature Granulocytes: 0 %
Lymphocytes Relative: 40 %
Lymphs Abs: 2.5 10*3/uL (ref 0.7–4.0)
MCH: 27.7 pg (ref 26.0–34.0)
MCHC: 31.1 g/dL (ref 30.0–36.0)
MCV: 89.1 fL (ref 80.0–100.0)
MONOS PCT: 7 %
Monocytes Absolute: 0.4 10*3/uL (ref 0.1–1.0)
Neutro Abs: 3 10*3/uL (ref 1.7–7.7)
Neutrophils Relative %: 47 %
Platelet Count: 136 10*3/uL — ABNORMAL LOW (ref 150–400)
RBC: 3.86 MIL/uL — ABNORMAL LOW (ref 3.87–5.11)
RDW: 15.2 % (ref 11.5–15.5)
WBC Count: 6.3 10*3/uL (ref 4.0–10.5)
nRBC: 0 % (ref 0.0–0.2)

## 2018-01-31 MED ORDER — CYANOCOBALAMIN 1000 MCG/ML IJ SOLN
INTRAMUSCULAR | Status: AC
  Filled 2018-01-31: qty 1

## 2018-01-31 MED ORDER — CLONIDINE HCL 0.1 MG PO TABS
0.1000 mg | ORAL_TABLET | Freq: Once | ORAL | Status: AC
  Administered 2018-01-31: 0.1 mg via ORAL

## 2018-01-31 MED ORDER — CLONIDINE HCL 0.1 MG PO TABS: 0.1000 mg | ORAL_TABLET | Freq: Once | ORAL | Status: DC

## 2018-01-31 MED ORDER — CLONIDINE HCL 0.1 MG PO TABS
ORAL_TABLET | ORAL | Status: AC
  Filled 2018-01-31: qty 1

## 2018-01-31 MED ORDER — CYANOCOBALAMIN 1000 MCG/ML IJ SOLN
1000.0000 ug | Freq: Once | INTRAMUSCULAR | Status: AC
  Administered 2018-01-31: 1000 ug via INTRAMUSCULAR

## 2018-01-31 MED ORDER — EPOETIN ALFA-EPBX 40000 UNIT/ML IJ SOLN
40000.0000 [IU] | Freq: Once | INTRAMUSCULAR | Status: AC
  Administered 2018-01-31: 40000 [IU] via SUBCUTANEOUS
  Filled 2018-01-31: qty 1

## 2018-01-31 NOTE — Assessment & Plan Note (Signed)
Lab review 01/31/2018:  Labs on 01/10/2018: Hemoglobin 10.1, platelets 137  Current treatment: B12 injections monthly, Retacrit injections monthly started 11/08/2017 Goal hemoglobin: To stay above 10 g without symptoms  Return to clinic monthly for these injections and follow-up with me in 3 months.

## 2018-01-31 NOTE — Patient Instructions (Signed)
Epoetin Alfa injection What is this medicine? EPOETIN ALFA (e POE e tin AL fa) helps your body make more red blood cells. This medicine is used to treat anemia caused by chronic kidney failure, cancer chemotherapy, or HIV-therapy. It may also be used before surgery if you have anemia. This medicine may be used for other purposes; ask your health care provider or pharmacist if you have questions. COMMON BRAND NAME(S): Epogen, Procrit What should I tell my health care provider before I take this medicine? They need to know if you have any of these conditions: -blood clotting disorders -cancer patient not on chemotherapy -cystic fibrosis -heart disease, such as angina or heart failure -hemoglobin level of 12 g/dL or greater -high blood pressure -low levels of folate, iron, or vitamin B12 -seizures -an unusual or allergic reaction to erythropoietin, albumin, benzyl alcohol, hamster proteins, other medicines, foods, dyes, or preservatives -pregnant or trying to get pregnant -breast-feeding How should I use this medicine? This medicine is for injection into a vein or under the skin. It is usually given by a health care professional in a hospital or clinic setting. If you get this medicine at home, you will be taught how to prepare and give this medicine. Use exactly as directed. Take your medicine at regular intervals. Do not take your medicine more often than directed. It is important that you put your used needles and syringes in a special sharps container. Do not put them in a trash can. If you do not have a sharps container, call your pharmacist or healthcare provider to get one. A special MedGuide will be given to you by the pharmacist with each prescription and refill. Be sure to read this information carefully each time. Talk to your pediatrician regarding the use of this medicine in children. While this drug may be prescribed for selected conditions, precautions do apply. Overdosage: If you  think you have taken too much of this medicine contact a poison control center or emergency room at once. NOTE: This medicine is only for you. Do not share this medicine with others. What if I miss a dose? If you miss a dose, take it as soon as you can. If it is almost time for your next dose, take only that dose. Do not take double or extra doses. What may interact with this medicine? Do not take this medicine with any of the following medications: -darbepoetin alfa This list may not describe all possible interactions. Give your health care provider a list of all the medicines, herbs, non-prescription drugs, or dietary supplements you use. Also tell them if you smoke, drink alcohol, or use illegal drugs. Some items may interact with your medicine. What should I watch for while using this medicine? Your condition will be monitored carefully while you are receiving this medicine. You may need blood work done while you are taking this medicine. What side effects may I notice from receiving this medicine? Side effects that you should report to your doctor or health care professional as soon as possible: -allergic reactions like skin rash, itching or hives, swelling of the face, lips, or tongue -breathing problems -changes in vision -chest pain -confusion, trouble speaking or understanding -feeling faint or lightheaded, falls -high blood pressure -muscle aches or pains -pain, swelling, warmth in the leg -rapid weight gain -severe headaches -sudden numbness or weakness of the face, arm or leg -trouble walking, dizziness, loss of balance or coordination -seizures (convulsions) -swelling of the ankles, feet, hands -unusually weak or tired   Side effects that usually do not require medical attention (report to your doctor or health care professional if they continue or are bothersome): -diarrhea -fever, chills (flu-like symptoms) -headaches -nausea, vomiting -redness, stinging, or swelling at  site where injected This list may not describe all possible side effects. Call your doctor for medical advice about side effects. You may report side effects to FDA at 1-800-FDA-1088. Where should I keep my medicine? Keep out of the reach of children. Store in a refrigerator between 2 and 8 degrees C (36 and 46 degrees F). Do not freeze or shake. Throw away any unused portion if using a single-dose vial. Multi-dose vials can be kept in the refrigerator for up to 21 days after the initial dose. Throw away unused medicine. NOTE: This sheet is a summary. It may not cover all possible information. If you have questions about this medicine, talk to your doctor, pharmacist, or health care provider.  2018 Elsevier/Gold Standard (2015-09-30 19:42:31) Cyanocobalamin, Vitamin B12 injection What is this medicine? CYANOCOBALAMIN (sye an oh koe BAL a min) is a man made form of vitamin B12. Vitamin B12 is used in the growth of healthy blood cells, nerve cells, and proteins in the body. It also helps with the metabolism of fats and carbohydrates. This medicine is used to treat people who can not absorb vitamin B12. This medicine may be used for other purposes; ask your health care provider or pharmacist if you have questions. COMMON BRAND NAME(S): B-12 Compliance Kit, B-12 Injection Kit, Cyomin, LA-12, Nutri-Twelve, Physicians EZ Use B-12, Primabalt What should I tell my health care provider before I take this medicine? They need to know if you have any of these conditions: -kidney disease -Leber's disease -megaloblastic anemia -an unusual or allergic reaction to cyanocobalamin, cobalt, other medicines, foods, dyes, or preservatives -pregnant or trying to get pregnant -breast-feeding How should I use this medicine? This medicine is injected into a muscle or deeply under the skin. It is usually given by a health care professional in a clinic or doctor's office. However, your doctor may teach you how to inject  yourself. Follow all instructions. Talk to your pediatrician regarding the use of this medicine in children. Special care may be needed. Overdosage: If you think you have taken too much of this medicine contact a poison control center or emergency room at once. NOTE: This medicine is only for you. Do not share this medicine with others. What if I miss a dose? If you are given your dose at a clinic or doctor's office, call to reschedule your appointment. If you give your own injections and you miss a dose, take it as soon as you can. If it is almost time for your next dose, take only that dose. Do not take double or extra doses. What may interact with this medicine? -colchicine -heavy alcohol intake This list may not describe all possible interactions. Give your health care provider a list of all the medicines, herbs, non-prescription drugs, or dietary supplements you use. Also tell them if you smoke, drink alcohol, or use illegal drugs. Some items may interact with your medicine. What should I watch for while using this medicine? Visit your doctor or health care professional regularly. You may need blood work done while you are taking this medicine. You may need to follow a special diet. Talk to your doctor. Limit your alcohol intake and avoid smoking to get the best benefit. What side effects may I notice from receiving this medicine? Side effects   that you should report to your doctor or health care professional as soon as possible: -allergic reactions like skin rash, itching or hives, swelling of the face, lips, or tongue -blue tint to skin -chest tightness, pain -difficulty breathing, wheezing -dizziness -red, swollen painful area on the leg Side effects that usually do not require medical attention (report to your doctor or health care professional if they continue or are bothersome): -diarrhea -headache This list may not describe all possible side effects. Call your doctor for medical  advice about side effects. You may report side effects to FDA at 1-800-FDA-1088. Where should I keep my medicine? Keep out of the reach of children. Store at room temperature between 15 and 30 degrees C (59 and 85 degrees F). Protect from light. Throw away any unused medicine after the expiration date. NOTE: This sheet is a summary. It may not cover all possible information. If you have questions about this medicine, talk to your doctor, pharmacist, or health care provider.  2018 Elsevier/Gold Standard (2007-05-23 22:10:20)  

## 2018-01-31 NOTE — Progress Notes (Signed)
Pt scheduled for Retacrit 40000 units today Bp is 207/85. Dr. Lindi Adie is aware of BP. Giving clonidine tablet 0.1 mg and monitor for systolic under 409 before leaving. Ok per [place] to give Retacrit.

## 2018-01-31 NOTE — Progress Notes (Signed)
Patient observed after clondine administration, BP reading 182/86.  Patient denies HA, chest pain, or dizziness.  Reviewed with Dr. Lindi Adie.  Okay to discharge patient.  Will send follow up message to PCP, [name], MD.  Patient educated by nurse if symptoms such as headache, chest pain, blurred or double vision develop go straight to ED.  Patient voiced understanding.

## 2018-01-31 NOTE — Progress Notes (Signed)
Patient Care Team: [name], MD as PCP - General (Family Medicine) Michael Boston, MD as Consulting Physician (General Surgery) Sydnee Cabal, MD as Consulting Physician (Orthopedic Surgery)  DIAGNOSIS:  Encounter Diagnoses  Name Primary?  . Vitamin B 12 deficiency Yes  . Normocytic anemia      CHIEF COMPLIANT: Follow-up of normocytic anemia on B12 and Retacrit injections  INTERVAL HISTORY: Bailey [name] is a 67 year old above-mentioned some normocytic anemia who is been on B12 injections and retrograde injections for the past couple of months.  His hemoglobin today is 10.7.  She however feels extremely fatigued.  Her son had recently tried to commit suicide and this is been wearing her down emotionally.  REVIEW OF SYSTEMS:   Constitutional: Denies fevers, chills or abnormal weight loss Eyes: Denies blurriness of vision Ears, nose, mouth, throat, and face: Denies mucositis or sore throat Respiratory: Denies cough, dyspnea or wheezes Cardiovascular: Denies palpitation, chest discomfort Gastrointestinal:  Denies nausea, heartburn or change in bowel habits Skin: Denies abnormal skin rashes Lymphatics: Denies new lymphadenopathy or easy bruising Neurological:Denies numbness, tingling or new weaknesses Behavioral/Psych: Mood is stable, no new changes  Extremities: No lower extremity edema  All other systems were reviewed with the patient and are negative.  I have reviewed the past medical history, past surgical history, social history and family history with the patient and they are unchanged from previous note.  ALLERGIES:  is allergic to no known allergies.  MEDICATIONS:  Current Outpatient Medications  Medication Sig Dispense Refill  . acetaminophen (TYLENOL) 500 MG tablet Take 500 mg by mouth every 6 (six) hours as needed for moderate pain (pain).    . ferrous sulfate 325 (65 FE) MG tablet Take 325 mg by mouth 2 (two) times daily with a meal.    .  HYDROcodone-acetaminophen (NORCO/VICODIN) 5-325 MG tablet Take 1 tablet by mouth every 4 (four) hours as needed for moderate pain. 40 tablet 0  . insulin glargine (LANTUS) 100 UNIT/ML injection Inject 15 Units into the skin daily. Takes at 1300     No current facility-administered medications for this visit.     PHYSICAL EXAMINATION: ECOG PERFORMANCE STATUS: 1 - Symptomatic but completely ambulatory  Vitals:   01/31/18 1524  BP: (!) 198/78  Pulse: 72  Resp: 18  Temp: 98 F (36.7 C)  SpO2: 100%   Filed Weights   01/31/18 1524  Weight: 134 lb 4.8 oz (60.9 kg)    GENERAL:alert, no distress and comfortable SKIN: skin color, texture, turgor are normal, no rashes or significant lesions EYES: normal, Conjunctiva are pink and non-injected, sclera clear OROPHARYNX:no exudate, no erythema and lips, buccal mucosa, and tongue normal  NECK: supple, thyroid normal size, non-tender, without nodularity LYMPH:  no palpable lymphadenopathy in the cervical, axillary or inguinal LUNGS: clear to auscultation and percussion with normal breathing effort HEART: regular rate & rhythm and no murmurs and no lower extremity edema ABDOMEN:abdomen soft, non-tender and normal bowel sounds MUSCULOSKELETAL:no cyanosis of digits and no clubbing  NEURO: alert & oriented x 3 with fluent speech, no focal motor/sensory deficits EXTREMITIES: No lower extremity edema   LABORATORY DATA:  I have reviewed the data as listed CMP Latest Ref Rng & Units 08/14/2016 05/15/2016 05/14/2016  Glucose 65 - 99 mg/dL 92 110(H) 122(H)  BUN 6 - 20 mg/dL 29(H) 6 7  Creatinine 0.44 - 1.00 mg/dL 0.91 0.75 0.63  Sodium 135 - 145 mmol/L 139 139 138  Potassium 3.5 - 5.1 mmol/L 3.4(L) 3.0(L)  3.0(L)  Chloride 101 - 111 mmol/L 111 102 103  CO2 22 - 32 mmol/L 19(L) 31 29  Calcium 8.9 - 10.3 mg/dL 8.9 7.6(L) 7.5(L)  Total Protein 6.5 - 8.1 g/dL 7.1 - -  Total Bilirubin 0.3 - 1.2 mg/dL 0.6 - -  Alkaline Phos 38 - 126 U/L 75 - -  AST 15 -  41 U/L 20 - -  ALT 14 - 54 U/L 14 - -    Lab Results  Component Value Date   WBC 6.3 01/31/2018   HGB 10.7 (L) 01/31/2018   HCT 34.4 (L) 01/31/2018   MCV 89.1 01/31/2018   PLT 136 (L) 01/31/2018   NEUTROABS 3.0 01/31/2018    ASSESSMENT & PLAN:  Normocytic anemia Lab review 01/31/2018: Hemoglobin 10.7, MCV 89, platelets 136  Current treatment: B12 injections monthly, Retacrit injections monthly started 11/08/2017 Goal hemoglobin: To stay above 10 g without symptoms  Severe hypertension: We will give her a dose of clonidine 0.1 mg and then discharge her home.  We will be in touch with her primary care physician to inform them of the patient's blood pressures.  Return to clinic monthly for these injections and follow-up with me in 3 months.    No orders of the defined types were placed in this encounter.  The patient has a good understanding of the overall plan. she agrees with it. she will call with any problems that may develop before the next visit here.   Harriette Ohara, MD 01/31/18

## 2018-01-31 NOTE — Telephone Encounter (Signed)
Gave avs and calendar ° °

## 2018-02-01 ENCOUNTER — Telehealth: Payer: Self-pay

## 2018-02-01 DIAGNOSIS — I10 Essential (primary) hypertension: Secondary | ICD-10-CM | POA: Diagnosis not present

## 2018-02-01 NOTE — Telephone Encounter (Signed)
Contacted pt PCP's office and spoke with nurse regarding pt BP readings yesterday's office visit. Informed them and requested to have them call pt for an appt follow at her PCP's office. They will be contacting pt today.

## 2018-02-08 DIAGNOSIS — Z6822 Body mass index (BMI) 22.0-22.9, adult: Secondary | ICD-10-CM | POA: Diagnosis not present

## 2018-02-08 DIAGNOSIS — I10 Essential (primary) hypertension: Secondary | ICD-10-CM | POA: Diagnosis not present

## 2018-02-17 ENCOUNTER — Ambulatory Visit: Payer: Medicare HMO

## 2018-02-21 ENCOUNTER — Other Ambulatory Visit: Payer: Medicare HMO

## 2018-02-21 ENCOUNTER — Ambulatory Visit: Payer: Medicare HMO

## 2018-02-21 DIAGNOSIS — I10 Essential (primary) hypertension: Secondary | ICD-10-CM | POA: Diagnosis not present

## 2018-02-24 DIAGNOSIS — I10 Essential (primary) hypertension: Secondary | ICD-10-CM | POA: Diagnosis not present

## 2018-02-25 ENCOUNTER — Telehealth: Payer: Self-pay | Admitting: Hematology and Oncology

## 2018-02-25 DIAGNOSIS — I10 Essential (primary) hypertension: Secondary | ICD-10-CM | POA: Diagnosis not present

## 2018-02-25 NOTE — Telephone Encounter (Signed)
Per 1/3 schedule message moved 1/6 appointments to 1/8. Spoke with patient spouse.

## 2018-02-28 ENCOUNTER — Ambulatory Visit: Payer: Medicare HMO

## 2018-02-28 ENCOUNTER — Other Ambulatory Visit: Payer: Medicare HMO

## 2018-03-02 ENCOUNTER — Inpatient Hospital Stay: Payer: Medicare HMO

## 2018-03-02 ENCOUNTER — Inpatient Hospital Stay: Payer: Medicare HMO | Attending: Hematology and Oncology

## 2018-03-02 VITALS — BP 143/61 | HR 72

## 2018-03-02 DIAGNOSIS — D649 Anemia, unspecified: Secondary | ICD-10-CM

## 2018-03-02 DIAGNOSIS — Z862 Personal history of diseases of the blood and blood-forming organs and certain disorders involving the immune mechanism: Secondary | ICD-10-CM

## 2018-03-02 DIAGNOSIS — D631 Anemia in chronic kidney disease: Secondary | ICD-10-CM | POA: Diagnosis not present

## 2018-03-02 DIAGNOSIS — N189 Chronic kidney disease, unspecified: Secondary | ICD-10-CM | POA: Diagnosis not present

## 2018-03-02 DIAGNOSIS — E538 Deficiency of other specified B group vitamins: Secondary | ICD-10-CM

## 2018-03-02 LAB — CBC WITH DIFFERENTIAL (CANCER CENTER ONLY)
Abs Immature Granulocytes: 0.02 10*3/uL (ref 0.00–0.07)
Basophils Absolute: 0 10*3/uL (ref 0.0–0.1)
Basophils Relative: 1 %
Eosinophils Absolute: 0.4 10*3/uL (ref 0.0–0.5)
Eosinophils Relative: 6 %
HCT: 32.5 % — ABNORMAL LOW (ref 36.0–46.0)
Hemoglobin: 10.1 g/dL — ABNORMAL LOW (ref 12.0–15.0)
Immature Granulocytes: 0 %
Lymphocytes Relative: 38 %
Lymphs Abs: 2.4 10*3/uL (ref 0.7–4.0)
MCH: 27.7 pg (ref 26.0–34.0)
MCHC: 31.1 g/dL (ref 30.0–36.0)
MCV: 89 fL (ref 80.0–100.0)
MONO ABS: 0.4 10*3/uL (ref 0.1–1.0)
Monocytes Relative: 7 %
NEUTROS ABS: 3.1 10*3/uL (ref 1.7–7.7)
Neutrophils Relative %: 48 %
Platelet Count: 129 10*3/uL — ABNORMAL LOW (ref 150–400)
RBC: 3.65 MIL/uL — ABNORMAL LOW (ref 3.87–5.11)
RDW: 15.2 % (ref 11.5–15.5)
WBC Count: 6.3 10*3/uL (ref 4.0–10.5)
nRBC: 0 % (ref 0.0–0.2)

## 2018-03-02 MED ORDER — CYANOCOBALAMIN 1000 MCG/ML IJ SOLN: 1000.0000 ug | Freq: Once | INTRAMUSCULAR | Status: DC

## 2018-03-02 MED ORDER — EPOETIN ALFA-EPBX 40000 UNIT/ML IJ SOLN
40000.0000 [IU] | Freq: Once | INTRAMUSCULAR | Status: AC
  Administered 2018-03-02: 40000 [IU] via SUBCUTANEOUS
  Filled 2018-03-02: qty 1

## 2018-03-03 DIAGNOSIS — Z794 Long term (current) use of insulin: Secondary | ICD-10-CM | POA: Diagnosis not present

## 2018-03-03 DIAGNOSIS — E113413 Type 2 diabetes mellitus with severe nonproliferative diabetic retinopathy with macular edema, bilateral: Secondary | ICD-10-CM | POA: Diagnosis not present

## 2018-03-23 ENCOUNTER — Ambulatory Visit: Payer: Medicare HMO

## 2018-03-28 ENCOUNTER — Inpatient Hospital Stay: Payer: Medicare HMO

## 2018-03-28 ENCOUNTER — Inpatient Hospital Stay: Payer: Medicare HMO | Attending: Hematology and Oncology

## 2018-03-28 VITALS — BP 154/75 | HR 70

## 2018-03-28 DIAGNOSIS — D631 Anemia in chronic kidney disease: Secondary | ICD-10-CM | POA: Insufficient documentation

## 2018-03-28 DIAGNOSIS — Z862 Personal history of diseases of the blood and blood-forming organs and certain disorders involving the immune mechanism: Secondary | ICD-10-CM

## 2018-03-28 DIAGNOSIS — N189 Chronic kidney disease, unspecified: Secondary | ICD-10-CM | POA: Diagnosis not present

## 2018-03-28 DIAGNOSIS — D649 Anemia, unspecified: Secondary | ICD-10-CM

## 2018-03-28 LAB — CBC WITH DIFFERENTIAL (CANCER CENTER ONLY)
Abs Immature Granulocytes: 0.01 10*3/uL (ref 0.00–0.07)
BASOS PCT: 1 %
Basophils Absolute: 0.1 10*3/uL (ref 0.0–0.1)
Eosinophils Absolute: 0.4 10*3/uL (ref 0.0–0.5)
Eosinophils Relative: 6 %
HCT: 33.4 % — ABNORMAL LOW (ref 36.0–46.0)
Hemoglobin: 10.2 g/dL — ABNORMAL LOW (ref 12.0–15.0)
IMMATURE GRANULOCYTES: 0 %
Lymphocytes Relative: 41 %
Lymphs Abs: 2.6 10*3/uL (ref 0.7–4.0)
MCH: 27.5 pg (ref 26.0–34.0)
MCHC: 30.5 g/dL (ref 30.0–36.0)
MCV: 90 fL (ref 80.0–100.0)
Monocytes Absolute: 0.4 10*3/uL (ref 0.1–1.0)
Monocytes Relative: 6 %
NEUTROS PCT: 46 %
Neutro Abs: 2.9 10*3/uL (ref 1.7–7.7)
PLATELETS: 166 10*3/uL (ref 150–400)
RBC: 3.71 MIL/uL — ABNORMAL LOW (ref 3.87–5.11)
RDW: 15.7 % — ABNORMAL HIGH (ref 11.5–15.5)
WBC Count: 6.4 10*3/uL (ref 4.0–10.5)
nRBC: 0 % (ref 0.0–0.2)

## 2018-03-28 MED ORDER — EPOETIN ALFA-EPBX 40000 UNIT/ML IJ SOLN
40000.0000 [IU] | Freq: Once | INTRAMUSCULAR | Status: AC
  Administered 2018-03-28: 40000 [IU] via SUBCUTANEOUS
  Filled 2018-03-28: qty 1

## 2018-03-28 MED ORDER — CYANOCOBALAMIN 1000 MCG/ML IJ SOLN
INTRAMUSCULAR | Status: AC
  Filled 2018-03-28: qty 1

## 2018-04-14 DIAGNOSIS — E113413 Type 2 diabetes mellitus with severe nonproliferative diabetic retinopathy with macular edema, bilateral: Secondary | ICD-10-CM | POA: Diagnosis not present

## 2018-04-14 DIAGNOSIS — Z794 Long term (current) use of insulin: Secondary | ICD-10-CM | POA: Diagnosis not present

## 2018-04-20 ENCOUNTER — Ambulatory Visit: Payer: Medicare HMO

## 2018-04-25 ENCOUNTER — Inpatient Hospital Stay: Payer: Medicare HMO

## 2018-04-25 ENCOUNTER — Inpatient Hospital Stay (HOSPITAL_BASED_OUTPATIENT_CLINIC_OR_DEPARTMENT_OTHER): Payer: Medicare HMO | Admitting: Hematology and Oncology

## 2018-04-25 ENCOUNTER — Telehealth: Payer: Self-pay | Admitting: Hematology and Oncology

## 2018-04-25 ENCOUNTER — Inpatient Hospital Stay: Payer: Medicare HMO | Attending: Hematology and Oncology

## 2018-04-25 VITALS — BP 190/85 | HR 74 | Temp 98.4°F | Resp 16 | Ht 64.0 in | Wt 143.3 lb

## 2018-04-25 VITALS — BP 158/82 | HR 72 | Temp 98.4°F | Resp 16

## 2018-04-25 DIAGNOSIS — D631 Anemia in chronic kidney disease: Secondary | ICD-10-CM | POA: Insufficient documentation

## 2018-04-25 DIAGNOSIS — D649 Anemia, unspecified: Secondary | ICD-10-CM | POA: Diagnosis not present

## 2018-04-25 DIAGNOSIS — I10 Essential (primary) hypertension: Secondary | ICD-10-CM

## 2018-04-25 DIAGNOSIS — N189 Chronic kidney disease, unspecified: Secondary | ICD-10-CM | POA: Insufficient documentation

## 2018-04-25 DIAGNOSIS — I129 Hypertensive chronic kidney disease with stage 1 through stage 4 chronic kidney disease, or unspecified chronic kidney disease: Secondary | ICD-10-CM | POA: Diagnosis not present

## 2018-04-25 DIAGNOSIS — E538 Deficiency of other specified B group vitamins: Secondary | ICD-10-CM | POA: Diagnosis not present

## 2018-04-25 DIAGNOSIS — Z862 Personal history of diseases of the blood and blood-forming organs and certain disorders involving the immune mechanism: Secondary | ICD-10-CM

## 2018-04-25 LAB — CBC WITH DIFFERENTIAL (CANCER CENTER ONLY)
Abs Immature Granulocytes: 0.01 10*3/uL (ref 0.00–0.07)
Basophils Absolute: 0.1 10*3/uL (ref 0.0–0.1)
Basophils Relative: 1 %
Eosinophils Absolute: 0.4 10*3/uL (ref 0.0–0.5)
Eosinophils Relative: 6 %
HCT: 31.8 % — ABNORMAL LOW (ref 36.0–46.0)
HEMOGLOBIN: 9.8 g/dL — AB (ref 12.0–15.0)
Immature Granulocytes: 0 %
Lymphocytes Relative: 40 %
Lymphs Abs: 2.8 10*3/uL (ref 0.7–4.0)
MCH: 27.5 pg (ref 26.0–34.0)
MCHC: 30.8 g/dL (ref 30.0–36.0)
MCV: 89.1 fL (ref 80.0–100.0)
Monocytes Absolute: 0.4 10*3/uL (ref 0.1–1.0)
Monocytes Relative: 6 %
Neutro Abs: 3.4 10*3/uL (ref 1.7–7.7)
Neutrophils Relative %: 47 %
Platelet Count: 129 10*3/uL — ABNORMAL LOW (ref 150–400)
RBC: 3.57 MIL/uL — ABNORMAL LOW (ref 3.87–5.11)
RDW: 16.2 % — ABNORMAL HIGH (ref 11.5–15.5)
WBC Count: 7.2 10*3/uL (ref 4.0–10.5)
nRBC: 0 % (ref 0.0–0.2)

## 2018-04-25 MED ORDER — CYANOCOBALAMIN 1000 MCG/ML IJ SOLN
1000.0000 ug | Freq: Once | INTRAMUSCULAR | Status: AC
  Administered 2018-04-25: 1000 ug via INTRAMUSCULAR

## 2018-04-25 MED ORDER — EPOETIN ALFA-EPBX 40000 UNIT/ML IJ SOLN
40000.0000 [IU] | Freq: Once | INTRAMUSCULAR | Status: AC
  Administered 2018-04-25: 40000 [IU] via SUBCUTANEOUS
  Filled 2018-04-25: qty 1

## 2018-04-25 NOTE — Progress Notes (Signed)
Patient Care Team: [name], MD as PCP - General (Family Medicine) Michael Boston, MD as Consulting Physician (General Surgery) Sydnee Cabal, MD as Consulting Physician (Orthopedic Surgery)  DIAGNOSIS:  Encounter Diagnoses  Name Primary?  . Normocytic anemia Yes  . Vitamin B 12 deficiency     CHIEF COMPLIANT: Follow-up of normocytic anemia on erythropoietin stimulation therapy  INTERVAL HISTORY: Bailey [name] is a 68 year old with above-mentioned history of normocytic anemia and also B12 deficiency who is currently on erythropoietin stimulation therapy with Retacrit 40,000 units.  She is asymptomatic regards to the anemia her hemoglobin today is 9.8 and she requires injection.  She does not have any shortness of breath exertion or dizziness or lightheadedness.  REVIEW OF SYSTEMS:   Constitutional: Denies fevers, chills or abnormal weight loss Eyes: Denies blurriness of vision Ears, nose, mouth, throat, and face: Denies mucositis or sore throat Respiratory: Denies cough, dyspnea or wheezes Cardiovascular: Denies palpitation, chest discomfort Gastrointestinal:  Denies nausea, heartburn or change in bowel habits Skin: Denies abnormal skin rashes Lymphatics: Denies new lymphadenopathy or easy bruising Neurological:Denies numbness, tingling or new weaknesses Behavioral/Psych: Mood is stable, no new changes  Extremities: No lower extremity edema   All other systems were reviewed with the patient and are negative.  I have reviewed the past medical history, past surgical history, social history and family history with the patient and they are unchanged from previous note.  ALLERGIES:  is allergic to no known allergies.  MEDICATIONS:  Current Outpatient Medications  Medication Sig Dispense Refill  . acetaminophen (TYLENOL) 500 MG tablet Take 500 mg by mouth every 6 (six) hours as needed for moderate pain (pain).    . ferrous sulfate 325 (65 FE) MG tablet Take 325 mg by  mouth 2 (two) times daily with a meal.    . HYDROcodone-acetaminophen (NORCO/VICODIN) 5-325 MG tablet Take 1 tablet by mouth every 4 (four) hours as needed for moderate pain. 40 tablet 0  . insulin glargine (LANTUS) 100 UNIT/ML injection Inject 15 Units into the skin daily. Takes at 1300     No current facility-administered medications for this visit.     PHYSICAL EXAMINATION: ECOG PERFORMANCE STATUS: 1 - Symptomatic but completely ambulatory  Vitals:   04/25/18 1545  BP: (!) 190/85  Pulse: 74  Resp: 16  Temp: 98.4 F (36.9 C)  SpO2: 100%   Filed Weights   04/25/18 1545  Weight: 143 lb 4.8 oz (65 kg)    GENERAL:alert, no distress and comfortable SKIN: skin color, texture, turgor are normal, no rashes or significant lesions EYES: normal, Conjunctiva are pink and non-injected, sclera clear OROPHARYNX:no exudate, no erythema and lips, buccal mucosa, and tongue normal  NECK: supple, thyroid normal size, non-tender, without nodularity LYMPH:  no palpable lymphadenopathy in the cervical, axillary or inguinal LUNGS: clear to auscultation and percussion with normal breathing effort HEART: regular rate & rhythm and no murmurs and no lower extremity edema ABDOMEN:abdomen soft, non-tender and normal bowel sounds MUSCULOSKELETAL:no cyanosis of digits and no clubbing  NEURO: alert & oriented x 3 with fluent speech, no focal motor/sensory deficits EXTREMITIES: No lower extremity edema   LABORATORY DATA:  I have reviewed the data as listed CMP Latest Ref Rng & Units 08/14/2016 05/15/2016 05/14/2016  Glucose 65 - 99 mg/dL 92 110(H) 122(H)  BUN 6 - 20 mg/dL 29(H) 6 7  Creatinine 0.44 - 1.00 mg/dL 0.91 0.75 0.63  Sodium 135 - 145 mmol/L 139 139 138  Potassium 3.5 - 5.1  mmol/L 3.4(L) 3.0(L) 3.0(L)  Chloride 101 - 111 mmol/L 111 102 103  CO2 22 - 32 mmol/L 19(L) 31 29  Calcium 8.9 - 10.3 mg/dL 8.9 7.6(L) 7.5(L)  Total Protein 6.5 - 8.1 g/dL 7.1 - -  Total Bilirubin 0.3 - 1.2 mg/dL 0.6 - -    Alkaline Phos 38 - 126 U/L 75 - -  AST 15 - 41 U/L 20 - -  ALT 14 - 54 U/L 14 - -    Lab Results  Component Value Date   WBC 7.2 04/25/2018   HGB 9.8 (L) 04/25/2018   HCT 31.8 (L) 04/25/2018   MCV 89.1 04/25/2018   PLT 129 (L) 04/25/2018   NEUTROABS 3.4 04/25/2018    ASSESSMENT & PLAN:  Normocytic anemia Lab review 04/25/2018: Hemoglobin 9.8, MCV 89, platelets 129  Current treatment: B12 injections monthly, Retacrit injections monthly started 11/08/2017 Goal hemoglobin: To keep hemoglobin above 10 g and without symptoms  Severe hypertension:: Continues to be a problem today's blood pressure is 190 x 83.  Instructed her to see her primary care physician.  Return to clinic monthly for these injections and follow-up with me in 3 months.    Orders Placed This Encounter  Procedures  . CBC with Differential (Cancer Center Only)    Standing Status:   Standing    Number of Occurrences:   20    Standing Expiration Date:   04/25/2019  . Iron and TIBC    Standing Status:   Future    Standing Expiration Date:   04/25/2019  . Ferritin    Standing Status:   Future    Standing Expiration Date:   04/25/2019   The patient has a good understanding of the overall plan. she agrees with it. she will call with any problems that may develop before the next visit here.   Harriette Ohara, MD 04/25/18

## 2018-04-25 NOTE — Patient Instructions (Signed)
Epoetin Alfa injection What is this medicine? EPOETIN ALFA (e POE e tin AL fa) helps your body make more red blood cells. This medicine is used to treat anemia caused by chronic kidney failure, cancer chemotherapy, or HIV-therapy. It may also be used before surgery if you have anemia. This medicine may be used for other purposes; ask your health care provider or pharmacist if you have questions. COMMON BRAND NAME(S): Epogen, Procrit What should I tell my health care provider before I take this medicine? They need to know if you have any of these conditions: -blood clotting disorders -cancer patient not on chemotherapy -cystic fibrosis -heart disease, such as angina or heart failure -hemoglobin level of 12 g/dL or greater -high blood pressure -low levels of folate, iron, or vitamin B12 -seizures -an unusual or allergic reaction to erythropoietin, albumin, benzyl alcohol, hamster proteins, other medicines, foods, dyes, or preservatives -pregnant or trying to get pregnant -breast-feeding How should I use this medicine? This medicine is for injection into a vein or under the skin. It is usually given by a health care professional in a hospital or clinic setting. If you get this medicine at home, you will be taught how to prepare and give this medicine. Use exactly as directed. Take your medicine at regular intervals. Do not take your medicine more often than directed. It is important that you put your used needles and syringes in a special sharps container. Do not put them in a trash can. If you do not have a sharps container, call your pharmacist or healthcare provider to get one. A special MedGuide will be given to you by the pharmacist with each prescription and refill. Be sure to read this information carefully each time. Talk to your pediatrician regarding the use of this medicine in children. While this drug may be prescribed for selected conditions, precautions do apply. Overdosage: If you  think you have taken too much of this medicine contact a poison control center or emergency room at once. NOTE: This medicine is only for you. Do not share this medicine with others. What if I miss a dose? If you miss a dose, take it as soon as you can. If it is almost time for your next dose, take only that dose. Do not take double or extra doses. What may interact with this medicine? Do not take this medicine with any of the following medications: -darbepoetin alfa This list may not describe all possible interactions. Give your health care provider a list of all the medicines, herbs, non-prescription drugs, or dietary supplements you use. Also tell them if you smoke, drink alcohol, or use illegal drugs. Some items may interact with your medicine. What should I watch for while using this medicine? Your condition will be monitored carefully while you are receiving this medicine. You may need blood work done while you are taking this medicine. What side effects may I notice from receiving this medicine? Side effects that you should report to your doctor or health care professional as soon as possible: -allergic reactions like skin rash, itching or hives, swelling of the face, lips, or tongue -breathing problems -changes in vision -chest pain -confusion, trouble speaking or understanding -feeling faint or lightheaded, falls -high blood pressure -muscle aches or pains -pain, swelling, warmth in the leg -rapid weight gain -severe headaches -sudden numbness or weakness of the face, arm or leg -trouble walking, dizziness, loss of balance or coordination -seizures (convulsions) -swelling of the ankles, feet, hands -unusually weak or tired   Side effects that usually do not require medical attention (report to your doctor or health care professional if they continue or are bothersome): -diarrhea -fever, chills (flu-like symptoms) -headaches -nausea, vomiting -redness, stinging, or swelling at  site where injected This list may not describe all possible side effects. Call your doctor for medical advice about side effects. You may report side effects to FDA at 1-800-FDA-1088. Where should I keep my medicine? Keep out of the reach of children. Store in a refrigerator between 2 and 8 degrees C (36 and 46 degrees F). Do not freeze or shake. Throw away any unused portion if using a single-dose vial. Multi-dose vials can be kept in the refrigerator for up to 21 days after the initial dose. Throw away unused medicine. NOTE: This sheet is a summary. It may not cover all possible information. If you have questions about this medicine, talk to your doctor, pharmacist, or health care provider.  2018 Elsevier/Gold Standard (2015-09-30 19:42:31) Cyanocobalamin, Vitamin B12 injection What is this medicine? CYANOCOBALAMIN (sye an oh koe BAL a min) is a man made form of vitamin B12. Vitamin B12 is used in the growth of healthy blood cells, nerve cells, and proteins in the body. It also helps with the metabolism of fats and carbohydrates. This medicine is used to treat people who can not absorb vitamin B12. This medicine may be used for other purposes; ask your health care provider or pharmacist if you have questions. COMMON BRAND NAME(S): B-12 Compliance Kit, B-12 Injection Kit, Cyomin, LA-12, Nutri-Twelve, Physicians EZ Use B-12, Primabalt What should I tell my health care provider before I take this medicine? They need to know if you have any of these conditions: -kidney disease -Leber's disease -megaloblastic anemia -an unusual or allergic reaction to cyanocobalamin, cobalt, other medicines, foods, dyes, or preservatives -pregnant or trying to get pregnant -breast-feeding How should I use this medicine? This medicine is injected into a muscle or deeply under the skin. It is usually given by a health care professional in a clinic or doctor's office. However, your doctor may teach you how to inject  yourself. Follow all instructions. Talk to your pediatrician regarding the use of this medicine in children. Special care may be needed. Overdosage: If you think you have taken too much of this medicine contact a poison control center or emergency room at once. NOTE: This medicine is only for you. Do not share this medicine with others. What if I miss a dose? If you are given your dose at a clinic or doctor's office, call to reschedule your appointment. If you give your own injections and you miss a dose, take it as soon as you can. If it is almost time for your next dose, take only that dose. Do not take double or extra doses. What may interact with this medicine? -colchicine -heavy alcohol intake This list may not describe all possible interactions. Give your health care provider a list of all the medicines, herbs, non-prescription drugs, or dietary supplements you use. Also tell them if you smoke, drink alcohol, or use illegal drugs. Some items may interact with your medicine. What should I watch for while using this medicine? Visit your doctor or health care professional regularly. You may need blood work done while you are taking this medicine. You may need to follow a special diet. Talk to your doctor. Limit your alcohol intake and avoid smoking to get the best benefit. What side effects may I notice from receiving this medicine? Side effects   that you should report to your doctor or health care professional as soon as possible: -allergic reactions like skin rash, itching or hives, swelling of the face, lips, or tongue -blue tint to skin -chest tightness, pain -difficulty breathing, wheezing -dizziness -red, swollen painful area on the leg Side effects that usually do not require medical attention (report to your doctor or health care professional if they continue or are bothersome): -diarrhea -headache This list may not describe all possible side effects. Call your doctor for medical  advice about side effects. You may report side effects to FDA at 1-800-FDA-1088. Where should I keep my medicine? Keep out of the reach of children. Store at room temperature between 15 and 30 degrees C (59 and 85 degrees F). Protect from light. Throw away any unused medicine after the expiration date. NOTE: This sheet is a summary. It may not cover all possible information. If you have questions about this medicine, talk to your doctor, pharmacist, or health care provider.  2018 Elsevier/Gold Standard (2007-05-23 22:10:20)  

## 2018-04-25 NOTE — Telephone Encounter (Signed)
Gave avs and calendar ° °

## 2018-04-25 NOTE — Assessment & Plan Note (Signed)
Lab review 01/31/2018: Hemoglobin 10.7, MCV 89, platelets 136  Current treatment: B12 injections monthly, Retacrit injections monthly started 11/08/2017 Goal hemoglobin: To stay above 10 g without symptoms  Severe hypertension:   Return to clinic monthly for these injections and follow-up with me in 3 months.

## 2018-05-13 DIAGNOSIS — E1142 Type 2 diabetes mellitus with diabetic polyneuropathy: Secondary | ICD-10-CM | POA: Diagnosis not present

## 2018-05-13 DIAGNOSIS — N289 Disorder of kidney and ureter, unspecified: Secondary | ICD-10-CM | POA: Diagnosis not present

## 2018-05-13 DIAGNOSIS — D509 Iron deficiency anemia, unspecified: Secondary | ICD-10-CM | POA: Diagnosis not present

## 2018-05-13 DIAGNOSIS — Z719 Counseling, unspecified: Secondary | ICD-10-CM | POA: Diagnosis not present

## 2018-05-18 ENCOUNTER — Ambulatory Visit: Payer: Medicare HMO

## 2018-05-18 DIAGNOSIS — I10 Essential (primary) hypertension: Secondary | ICD-10-CM | POA: Diagnosis not present

## 2018-05-18 DIAGNOSIS — D649 Anemia, unspecified: Secondary | ICD-10-CM | POA: Diagnosis not present

## 2018-05-19 DIAGNOSIS — I10 Essential (primary) hypertension: Secondary | ICD-10-CM | POA: Diagnosis not present

## 2018-05-19 DIAGNOSIS — N289 Disorder of kidney and ureter, unspecified: Secondary | ICD-10-CM | POA: Diagnosis not present

## 2018-05-19 DIAGNOSIS — D519 Vitamin B12 deficiency anemia, unspecified: Secondary | ICD-10-CM | POA: Diagnosis not present

## 2018-05-24 DIAGNOSIS — I10 Essential (primary) hypertension: Secondary | ICD-10-CM | POA: Diagnosis not present

## 2018-05-25 ENCOUNTER — Telehealth: Payer: Self-pay

## 2018-05-25 NOTE — Telephone Encounter (Signed)
Pt's husband left voicemail regarding upcoming appointments.  Nurse returned call, left voicemail for call back.

## 2018-05-26 ENCOUNTER — Other Ambulatory Visit: Payer: Self-pay

## 2018-05-26 ENCOUNTER — Inpatient Hospital Stay: Payer: Medicare HMO | Attending: Hematology and Oncology

## 2018-05-26 ENCOUNTER — Inpatient Hospital Stay: Payer: Medicare HMO

## 2018-05-26 VITALS — HR 73 | Temp 97.9°F | Resp 18

## 2018-05-26 DIAGNOSIS — E538 Deficiency of other specified B group vitamins: Secondary | ICD-10-CM

## 2018-05-26 DIAGNOSIS — N189 Chronic kidney disease, unspecified: Principal | ICD-10-CM

## 2018-05-26 DIAGNOSIS — D631 Anemia in chronic kidney disease: Secondary | ICD-10-CM | POA: Diagnosis present

## 2018-05-26 DIAGNOSIS — D649 Anemia, unspecified: Secondary | ICD-10-CM | POA: Insufficient documentation

## 2018-05-26 DIAGNOSIS — Z862 Personal history of diseases of the blood and blood-forming organs and certain disorders involving the immune mechanism: Secondary | ICD-10-CM

## 2018-05-26 LAB — CBC WITH DIFFERENTIAL (CANCER CENTER ONLY)
Abs Immature Granulocytes: 0.01 10*3/uL (ref 0.00–0.07)
Basophils Absolute: 0.1 10*3/uL (ref 0.0–0.1)
Basophils Relative: 1 %
Eosinophils Absolute: 0.5 10*3/uL (ref 0.0–0.5)
Eosinophils Relative: 7 %
HCT: 33.2 % — ABNORMAL LOW (ref 36.0–46.0)
Hemoglobin: 10.1 g/dL — ABNORMAL LOW (ref 12.0–15.0)
Immature Granulocytes: 0 %
Lymphocytes Relative: 37 %
Lymphs Abs: 2.5 10*3/uL (ref 0.7–4.0)
MCH: 28 pg (ref 26.0–34.0)
MCHC: 30.4 g/dL (ref 30.0–36.0)
MCV: 92 fL (ref 80.0–100.0)
Monocytes Absolute: 0.4 10*3/uL (ref 0.1–1.0)
Monocytes Relative: 6 %
Neutro Abs: 3.3 10*3/uL (ref 1.7–7.7)
Neutrophils Relative %: 49 %
Platelet Count: 138 10*3/uL — ABNORMAL LOW (ref 150–400)
RBC: 3.61 MIL/uL — ABNORMAL LOW (ref 3.87–5.11)
RDW: 15.9 % — ABNORMAL HIGH (ref 11.5–15.5)
WBC Count: 6.8 10*3/uL (ref 4.0–10.5)
nRBC: 0 % (ref 0.0–0.2)

## 2018-05-26 MED ORDER — CYANOCOBALAMIN 1000 MCG/ML IJ SOLN
INTRAMUSCULAR | Status: AC
  Filled 2018-05-26: qty 1

## 2018-05-26 MED ORDER — CYANOCOBALAMIN 1000 MCG/ML IJ SOLN
1000.0000 ug | Freq: Once | INTRAMUSCULAR | Status: AC
  Administered 2018-05-26: 16:00:00 1000 ug via INTRAMUSCULAR

## 2018-05-26 NOTE — Patient Instructions (Signed)
Cyanocobalamin, Vitamin B12 injection What is this medicine? CYANOCOBALAMIN (sye an oh koe BAL a min) is a man made form of vitamin B12. Vitamin B12 is used in the growth of healthy blood cells, nerve cells, and proteins in the body. It also helps with the metabolism of fats and carbohydrates. This medicine is used to treat people who can not absorb vitamin B12. This medicine may be used for other purposes; ask your health care provider or pharmacist if you have questions. COMMON BRAND NAME(S): B-12 Compliance Kit, B-12 Injection Kit, Cyomin, LA-12, Nutri-Twelve, Physicians EZ Use B-12, Primabalt What should I tell my health care provider before I take this medicine? They need to know if you have any of these conditions: -kidney disease -Leber's disease -megaloblastic anemia -an unusual or allergic reaction to cyanocobalamin, cobalt, other medicines, foods, dyes, or preservatives -pregnant or trying to get pregnant -breast-feeding How should I use this medicine? This medicine is injected into a muscle or deeply under the skin. It is usually given by a health care professional in a clinic or doctor's office. However, your doctor may teach you how to inject yourself. Follow all instructions. Talk to your pediatrician regarding the use of this medicine in children. Special care may be needed. Overdosage: If you think you have taken too much of this medicine contact a poison control center or emergency room at once. NOTE: This medicine is only for you. Do not share this medicine with others. What if I miss a dose? If you are given your dose at a clinic or doctor's office, call to reschedule your appointment. If you give your own injections and you miss a dose, take it as soon as you can. If it is almost time for your next dose, take only that dose. Do not take double or extra doses. What may interact with this medicine? -colchicine -heavy alcohol intake This list may not describe all possible  interactions. Give your health care provider a list of all the medicines, herbs, non-prescription drugs, or dietary supplements you use. Also tell them if you smoke, drink alcohol, or use illegal drugs. Some items may interact with your medicine. What should I watch for while using this medicine? Visit your doctor or health care professional regularly. You may need blood work done while you are taking this medicine. You may need to follow a special diet. Talk to your doctor. Limit your alcohol intake and avoid smoking to get the best benefit. What side effects may I notice from receiving this medicine? Side effects that you should report to your doctor or health care professional as soon as possible: -allergic reactions like skin rash, itching or hives, swelling of the face, lips, or tongue -blue tint to skin -chest tightness, pain -difficulty breathing, wheezing -dizziness -red, swollen painful area on the leg Side effects that usually do not require medical attention (report to your doctor or health care professional if they continue or are bothersome): -diarrhea -headache This list may not describe all possible side effects. Call your doctor for medical advice about side effects. You may report side effects to FDA at 1-800-FDA-1088. Where should I keep my medicine? Keep out of the reach of children. Store at room temperature between 15 and 30 degrees C (59 and 85 degrees F). Protect from light. Throw away any unused medicine after the expiration date. NOTE: This sheet is a summary. It may not cover all possible information. If you have questions about this medicine, talk to your doctor, pharmacist, or   health care provider.  2019 Elsevier/Gold Standard (2007-05-23 22:10:20)  

## 2018-05-27 DIAGNOSIS — E1159 Type 2 diabetes mellitus with other circulatory complications: Secondary | ICD-10-CM | POA: Diagnosis not present

## 2018-05-27 DIAGNOSIS — R7989 Other specified abnormal findings of blood chemistry: Secondary | ICD-10-CM | POA: Diagnosis not present

## 2018-05-27 DIAGNOSIS — I10 Essential (primary) hypertension: Secondary | ICD-10-CM | POA: Diagnosis not present

## 2018-06-10 DIAGNOSIS — E11319 Type 2 diabetes mellitus with unspecified diabetic retinopathy without macular edema: Secondary | ICD-10-CM | POA: Diagnosis not present

## 2018-06-10 DIAGNOSIS — I10 Essential (primary) hypertension: Secondary | ICD-10-CM | POA: Diagnosis not present

## 2018-06-10 DIAGNOSIS — N289 Disorder of kidney and ureter, unspecified: Secondary | ICD-10-CM | POA: Diagnosis not present

## 2018-06-13 DIAGNOSIS — N289 Disorder of kidney and ureter, unspecified: Secondary | ICD-10-CM | POA: Diagnosis not present

## 2018-06-13 DIAGNOSIS — I10 Essential (primary) hypertension: Secondary | ICD-10-CM | POA: Diagnosis not present

## 2018-06-13 DIAGNOSIS — F411 Generalized anxiety disorder: Secondary | ICD-10-CM | POA: Diagnosis not present

## 2018-06-13 DIAGNOSIS — R5383 Other fatigue: Secondary | ICD-10-CM | POA: Diagnosis not present

## 2018-06-14 DIAGNOSIS — R5383 Other fatigue: Secondary | ICD-10-CM | POA: Diagnosis not present

## 2018-06-14 DIAGNOSIS — E119 Type 2 diabetes mellitus without complications: Secondary | ICD-10-CM | POA: Diagnosis not present

## 2018-06-14 DIAGNOSIS — N289 Disorder of kidney and ureter, unspecified: Secondary | ICD-10-CM | POA: Diagnosis not present

## 2018-06-14 DIAGNOSIS — D509 Iron deficiency anemia, unspecified: Secondary | ICD-10-CM | POA: Diagnosis not present

## 2018-06-15 ENCOUNTER — Ambulatory Visit: Payer: Medicare HMO

## 2018-06-15 DIAGNOSIS — I10 Essential (primary) hypertension: Secondary | ICD-10-CM | POA: Diagnosis not present

## 2018-06-15 DIAGNOSIS — N289 Disorder of kidney and ureter, unspecified: Secondary | ICD-10-CM | POA: Diagnosis not present

## 2018-06-15 DIAGNOSIS — E1142 Type 2 diabetes mellitus with diabetic polyneuropathy: Secondary | ICD-10-CM | POA: Diagnosis not present

## 2018-06-15 DIAGNOSIS — D519 Vitamin B12 deficiency anemia, unspecified: Secondary | ICD-10-CM | POA: Diagnosis not present

## 2018-06-17 DIAGNOSIS — I10 Essential (primary) hypertension: Secondary | ICD-10-CM | POA: Diagnosis not present

## 2018-06-17 DIAGNOSIS — E1159 Type 2 diabetes mellitus with other circulatory complications: Secondary | ICD-10-CM | POA: Diagnosis not present

## 2018-06-17 DIAGNOSIS — E1142 Type 2 diabetes mellitus with diabetic polyneuropathy: Secondary | ICD-10-CM | POA: Diagnosis not present

## 2018-06-17 DIAGNOSIS — R5383 Other fatigue: Secondary | ICD-10-CM | POA: Diagnosis not present

## 2018-06-24 DIAGNOSIS — R5383 Other fatigue: Secondary | ICD-10-CM | POA: Diagnosis not present

## 2018-06-24 DIAGNOSIS — N289 Disorder of kidney and ureter, unspecified: Secondary | ICD-10-CM | POA: Diagnosis not present

## 2018-06-24 DIAGNOSIS — E119 Type 2 diabetes mellitus without complications: Secondary | ICD-10-CM | POA: Diagnosis not present

## 2018-06-24 DIAGNOSIS — I10 Essential (primary) hypertension: Secondary | ICD-10-CM | POA: Diagnosis not present

## 2018-06-27 ENCOUNTER — Telehealth: Payer: Self-pay | Admitting: *Deleted

## 2018-06-27 ENCOUNTER — Inpatient Hospital Stay: Payer: Medicare HMO

## 2018-06-27 ENCOUNTER — Telehealth: Payer: Self-pay | Admitting: Hematology and Oncology

## 2018-06-27 DIAGNOSIS — N289 Disorder of kidney and ureter, unspecified: Secondary | ICD-10-CM | POA: Diagnosis not present

## 2018-06-27 DIAGNOSIS — R5383 Other fatigue: Secondary | ICD-10-CM | POA: Diagnosis not present

## 2018-06-27 NOTE — Telephone Encounter (Signed)
Received call from pt husband Elenore Rota stating that the pt has an injection apt this afternoon and their insurance is not covering her aranesp and B12 injections.  Elenore Rota states that 6 months ago he filled out paperwork for assistance.  I spoke with Rob in pharmacy and he stated that on 11/08/2017 the assistance was denied because the pts insurance was going to cover the cost of the medication.  I informed Elenore Rota of this and he stated that he also gave Dr. Lindi Adie paperwork to approve the injections and to send to his insurance company.  I spoke with Dr. Lindi Adie and he does not remember from 6 months ago regarding the paperwork.  I instructed pt husband to contact his insurance company to see if they received the paperwork or if they needed more information filled out.  I gave him our fax number and re scheduled the pts lab and injection apt for next Monday 07/04/2018 to give [place] time to figure this out.  Pt husband verbalized understanding.

## 2018-06-27 NOTE — Telephone Encounter (Signed)
Per sch msg, moved 6/4 to 6/11. Called and spoke with patient. Due to transportation, patient can only come after 3pm. Rescheduled appt to 6/16 due to availability of MD and patients transportation. Confirmed date and time with patient

## 2018-06-27 NOTE — Telephone Encounter (Signed)
Called patient per 5/4 sch message - unable to reach patient . Left message with appt date and time

## 2018-06-30 DIAGNOSIS — N289 Disorder of kidney and ureter, unspecified: Secondary | ICD-10-CM | POA: Diagnosis not present

## 2018-06-30 DIAGNOSIS — E1142 Type 2 diabetes mellitus with diabetic polyneuropathy: Secondary | ICD-10-CM | POA: Diagnosis not present

## 2018-06-30 DIAGNOSIS — I10 Essential (primary) hypertension: Secondary | ICD-10-CM | POA: Diagnosis not present

## 2018-07-04 ENCOUNTER — Other Ambulatory Visit: Payer: Self-pay

## 2018-07-04 ENCOUNTER — Inpatient Hospital Stay: Payer: Medicare HMO | Attending: Hematology and Oncology

## 2018-07-04 ENCOUNTER — Inpatient Hospital Stay: Payer: Medicare HMO

## 2018-07-04 VITALS — BP 154/72 | HR 71 | Temp 99.1°F | Resp 18

## 2018-07-04 DIAGNOSIS — N189 Chronic kidney disease, unspecified: Secondary | ICD-10-CM | POA: Insufficient documentation

## 2018-07-04 DIAGNOSIS — E538 Deficiency of other specified B group vitamins: Secondary | ICD-10-CM | POA: Diagnosis not present

## 2018-07-04 DIAGNOSIS — Z862 Personal history of diseases of the blood and blood-forming organs and certain disorders involving the immune mechanism: Secondary | ICD-10-CM

## 2018-07-04 DIAGNOSIS — D631 Anemia in chronic kidney disease: Secondary | ICD-10-CM | POA: Diagnosis not present

## 2018-07-04 DIAGNOSIS — D649 Anemia, unspecified: Secondary | ICD-10-CM

## 2018-07-04 LAB — CBC WITH DIFFERENTIAL (CANCER CENTER ONLY)
Abs Immature Granulocytes: 0.01 10*3/uL (ref 0.00–0.07)
Basophils Absolute: 0 10*3/uL (ref 0.0–0.1)
Basophils Relative: 1 %
Eosinophils Absolute: 0.4 10*3/uL (ref 0.0–0.5)
Eosinophils Relative: 5 %
HCT: 29.7 % — ABNORMAL LOW (ref 36.0–46.0)
Hemoglobin: 9.1 g/dL — ABNORMAL LOW (ref 12.0–15.0)
Immature Granulocytes: 0 %
Lymphocytes Relative: 41 %
Lymphs Abs: 2.9 10*3/uL (ref 0.7–4.0)
MCH: 28.3 pg (ref 26.0–34.0)
MCHC: 30.6 g/dL (ref 30.0–36.0)
MCV: 92.5 fL (ref 80.0–100.0)
Monocytes Absolute: 0.5 10*3/uL (ref 0.1–1.0)
Monocytes Relative: 7 %
Neutro Abs: 3.2 10*3/uL (ref 1.7–7.7)
Neutrophils Relative %: 46 %
Platelet Count: 145 10*3/uL — ABNORMAL LOW (ref 150–400)
RBC: 3.21 MIL/uL — ABNORMAL LOW (ref 3.87–5.11)
RDW: 15.4 % (ref 11.5–15.5)
WBC Count: 6.9 10*3/uL (ref 4.0–10.5)
nRBC: 0 % (ref 0.0–0.2)

## 2018-07-04 MED ORDER — CYANOCOBALAMIN 1000 MCG/ML IJ SOLN
1000.0000 ug | Freq: Once | INTRAMUSCULAR | Status: AC
  Administered 2018-07-04: 1000 ug via INTRAMUSCULAR

## 2018-07-04 MED ORDER — EPOETIN ALFA-EPBX 40000 UNIT/ML IJ SOLN
40000.0000 [IU] | Freq: Once | INTRAMUSCULAR | Status: AC
  Administered 2018-07-04: 40000 [IU] via SUBCUTANEOUS
  Filled 2018-07-04: qty 1

## 2018-07-04 MED ORDER — CYANOCOBALAMIN 1000 MCG/ML IJ SOLN
INTRAMUSCULAR | Status: AC
  Filled 2018-07-04: qty 1

## 2018-07-04 NOTE — Patient Instructions (Signed)
Epoetin Alfa injection What is this medicine? EPOETIN ALFA (e POE e tin AL fa) helps your body make more red blood cells. This medicine is used to treat anemia caused by chronic kidney disease, cancer chemotherapy, or HIV-therapy. It may also be used before surgery if you have anemia. This medicine may be used for other purposes; ask your health care provider or pharmacist if you have questions. COMMON BRAND NAME(S): Epogen, Procrit, Retacrit What should I tell my health care provider before I take this medicine? They need to know if you have any of these conditions: -cancer -heart disease -high blood pressure -history of blood clots -history of stroke -low levels of folate, iron, or vitamin B12 in the blood -seizures -an unusual or allergic reaction to erythropoietin, albumin, benzyl alcohol, hamster proteins, other medicines, foods, dyes, or preservatives -pregnant or trying to get pregnant -breast-feeding How should I use this medicine? This medicine is for injection into a vein or under the skin. It is usually given by a health care professional in a hospital or clinic setting. If you get this medicine at home, you will be taught how to prepare and give this medicine. Use exactly as directed. Take your medicine at regular intervals. Do not take your medicine more often than directed. It is important that you put your used needles and syringes in a special sharps container. Do not put them in a trash can. If you do not have a sharps container, call your pharmacist or healthcare provider to get one. A special MedGuide will be given to you by the pharmacist with each prescription and refill. Be sure to read this information carefully each time. Talk to your pediatrician regarding the use of this medicine in children. While this drug may be prescribed for selected conditions, precautions do apply. Overdosage: If you think you have taken too much of this medicine contact a poison control center  or emergency room at once. NOTE: This medicine is only for you. Do not share this medicine with others. What if I miss a dose? If you miss a dose, take it as soon as you can. If it is almost time for your next dose, take only that dose. Do not take double or extra doses. What may interact with this medicine? Interactions have not been studied. This list may not describe all possible interactions. Give your health care provider a list of all the medicines, herbs, non-prescription drugs, or dietary supplements you use. Also tell them if you smoke, drink alcohol, or use illegal drugs. Some items may interact with your medicine. What should I watch for while using this medicine? Your condition will be monitored carefully while you are receiving this medicine. You may need blood work done while you are taking this medicine. This medicine may cause a decrease in vitamin B6. You should make sure that you get enough vitamin B6 while you are taking this medicine. Discuss the foods you eat and the vitamins you take with your health care professional. What side effects may I notice from receiving this medicine? Side effects that you should report to your doctor or health care professional as soon as possible: -allergic reactions like skin rash, itching or hives, swelling of the face, lips, or tongue -seizures -signs and symptoms of a blood clot such as breathing problems; changes in vision; chest pain; severe, sudden headache; pain, swelling, warmth in the leg; trouble speaking; sudden numbness or weakness of the face, arm or leg -signs and symptoms of a stroke   like changes in vision; confusion; trouble speaking or understanding; severe headaches; sudden numbness or weakness of the face, arm or leg; trouble walking; dizziness; loss of balance or coordination Side effects that usually do not require medical attention (report to your doctor or health care professional if they continue or are  bothersome): -chills -cough -dizziness -fever -headaches -joint pain -muscle cramps -muscle pain -nausea, vomiting -pain, redness, or irritation at site where injected This list may not describe all possible side effects. Call your doctor for medical advice about side effects. You may report side effects to FDA at 1-800-FDA-1088. Where should I keep my medicine? Keep out of the reach of children. Store in a refrigerator between 2 and 8 degrees C (36 and 46 degrees F). Do not freeze or shake. Throw away any unused portion if using a single-dose vial. Multi-dose vials can be kept in the refrigerator for up to 21 days after the initial dose. Throw away unused medicine. NOTE: This sheet is a summary. It may not cover all possible information. If you have questions about this medicine, talk to your doctor, pharmacist, or health care provider.  2019 Elsevier/Gold Standard (2016-09-18 08:35:19) Cyanocobalamin, Vitamin B12 injection What is this medicine? CYANOCOBALAMIN (sye an oh koe BAL a min) is a man made form of vitamin B12. Vitamin B12 is used in the growth of healthy blood cells, nerve cells, and proteins in the body. It also helps with the metabolism of fats and carbohydrates. This medicine is used to treat people who can not absorb vitamin B12. This medicine may be used for other purposes; ask your health care provider or pharmacist if you have questions. COMMON BRAND NAME(S): B-12 Compliance Kit, B-12 Injection Kit, Cyomin, LA-12, Nutri-Twelve, Physicians EZ Use B-12, Primabalt What should I tell my health care provider before I take this medicine? They need to know if you have any of these conditions: -kidney disease -Leber's disease -megaloblastic anemia -an unusual or allergic reaction to cyanocobalamin, cobalt, other medicines, foods, dyes, or preservatives -pregnant or trying to get pregnant -breast-feeding How should I use this medicine? This medicine is injected into a muscle  or deeply under the skin. It is usually given by a health care professional in a clinic or doctor's office. However, your doctor may teach you how to inject yourself. Follow all instructions. Talk to your pediatrician regarding the use of this medicine in children. Special care may be needed. Overdosage: If you think you have taken too much of this medicine contact a poison control center or emergency room at once. NOTE: This medicine is only for you. Do not share this medicine with others. What if I miss a dose? If you are given your dose at a clinic or doctor's office, call to reschedule your appointment. If you give your own injections and you miss a dose, take it as soon as you can. If it is almost time for your next dose, take only that dose. Do not take double or extra doses. What may interact with this medicine? -colchicine -heavy alcohol intake This list may not describe all possible interactions. Give your health care provider a list of all the medicines, herbs, non-prescription drugs, or dietary supplements you use. Also tell them if you smoke, drink alcohol, or use illegal drugs. Some items may interact with your medicine. What should I watch for while using this medicine? Visit your doctor or health care professional regularly. You may need blood work done while you are taking this medicine. You may  may need to follow a special diet. Talk to your doctor. Limit your alcohol intake and avoid smoking to get the best benefit. What side effects may I notice from receiving this medicine? Side effects that you should report to your doctor or health care professional as soon as possible: -allergic reactions like skin rash, itching or hives, swelling of the face, lips, or tongue -blue tint to skin -chest tightness, pain -difficulty breathing, wheezing -dizziness -red, swollen painful area on the leg Side effects that usually do not require medical attention (report to your doctor or health  care professional if they continue or are bothersome): -diarrhea -headache This list may not describe all possible side effects. Call your doctor for medical advice about side effects. You may report side effects to FDA at 1-800-FDA-1088. Where should I keep my medicine? Keep out of the reach of children. Store at room temperature between 15 and 30 degrees C (59 and 85 degrees F). Protect from light. Throw away any unused medicine after the expiration date. NOTE: This sheet is a summary. It may not cover all possible information. If you have questions about this medicine, talk to your doctor, pharmacist, or health care provider.  2019 Elsevier/Gold Standard (2007-05-23 22:10:20)  

## 2018-07-05 ENCOUNTER — Telehealth: Payer: Self-pay | Admitting: *Deleted

## 2018-07-05 NOTE — Telephone Encounter (Signed)
Attempt x 1 to return call to pt.  LVM

## 2018-07-28 ENCOUNTER — Ambulatory Visit: Payer: Medicare HMO

## 2018-07-28 ENCOUNTER — Other Ambulatory Visit: Payer: Medicare HMO

## 2018-07-28 ENCOUNTER — Ambulatory Visit: Payer: Medicare HMO | Admitting: Hematology and Oncology

## 2018-08-02 NOTE — Assessment & Plan Note (Signed)
Lab review 04/25/2018: Hemoglobin 9.8, MCV 89, platelets 129  Current treatment: B12 injections monthly, Retacrit injections monthly started 11/08/2017 Goal hemoglobin: To keep hemoglobin above10 g and without symptoms  Severe hypertension::   Return to clinic monthly for these injections and follow-up with me in 3 months.

## 2018-08-04 ENCOUNTER — Other Ambulatory Visit: Payer: Medicare HMO

## 2018-08-04 ENCOUNTER — Ambulatory Visit: Payer: Medicare HMO | Admitting: Adult Health

## 2018-08-04 ENCOUNTER — Ambulatory Visit: Payer: Medicare HMO

## 2018-08-04 DIAGNOSIS — E663 Overweight: Secondary | ICD-10-CM | POA: Diagnosis not present

## 2018-08-04 DIAGNOSIS — E11319 Type 2 diabetes mellitus with unspecified diabetic retinopathy without macular edema: Secondary | ICD-10-CM | POA: Diagnosis not present

## 2018-08-04 DIAGNOSIS — Z0000 Encounter for general adult medical examination without abnormal findings: Secondary | ICD-10-CM | POA: Diagnosis not present

## 2018-08-04 DIAGNOSIS — I10 Essential (primary) hypertension: Secondary | ICD-10-CM | POA: Diagnosis not present

## 2018-08-08 NOTE — Progress Notes (Signed)
Patient Care Team: [name], MD as PCP - General (Family Medicine) Michael Boston, MD as Consulting Physician (General Surgery) Sydnee Cabal, MD as Consulting Physician (Orthopedic Surgery)  DIAGNOSIS:    ICD-10-CM   1. Vitamin B 12 deficiency  E53.8   2. Normocytic anemia  D64.9     CHIEF COMPLIANT: Follow-up of normocytic anemia on erythropoietin stimulation therapy  INTERVAL HISTORY: Bailey [name] is a 68 y.o. with above-mentioned history of normocytic anemia and also B12 deficiency who is currently on erythropoietin stimulation therapy with Retacrit. Her most recent labs from 07/04/18 show: WBC 6.9, Hg 9.1, HCT 29.7, platelets 145. She presents to the clinic today for follow-up to receive B-12 and Retacrit.   REVIEW OF SYSTEMS:   Constitutional: Denies fevers, chills or abnormal weight loss Eyes: Denies blurriness of vision Ears, nose, mouth, throat, and face: Denies mucositis or sore throat Respiratory: Denies cough, dyspnea or wheezes Cardiovascular: Denies palpitation, chest discomfort Gastrointestinal: Denies nausea, heartburn or change in bowel habits Skin: Denies abnormal skin rashes Lymphatics: Denies new lymphadenopathy or easy bruising Neurological: Denies numbness, tingling or new weaknesses Behavioral/Psych: Mood is stable, no new changes  Extremities: No lower extremity edema Breast: denies any pain or lumps or nodules in either breasts All other systems were reviewed with the patient and are negative.  I have reviewed the past medical history, past surgical history, social history and family history with the patient and they are unchanged from previous note.  ALLERGIES:  is allergic to no known allergies.  MEDICATIONS:  Current Outpatient Medications  Medication Sig Dispense Refill  . acetaminophen (TYLENOL) 500 MG tablet Take 500 mg by mouth every 6 (six) hours as needed for moderate pain (pain).    . ferrous sulfate 325 (65 FE) MG tablet Take  325 mg by mouth 2 (two) times daily with a meal.    . HYDROcodone-acetaminophen (NORCO/VICODIN) 5-325 MG tablet Take 1 tablet by mouth every 4 (four) hours as needed for moderate pain. 40 tablet 0  . insulin glargine (LANTUS) 100 UNIT/ML injection Inject 15 Units into the skin daily. Takes at 1300     No current facility-administered medications for this visit.     PHYSICAL EXAMINATION: ECOG PERFORMANCE STATUS: 1 - Symptomatic but completely ambulatory  Vitals:   08/09/18 1537  BP: (!) 193/73  Pulse: 71  Resp: (!) 22  Temp: 98.3 F (36.8 C)  SpO2: 100%   Filed Weights   08/09/18 1537  Weight: 137 lb 4.8 oz (62.3 kg)    GENERAL: alert, no distress and comfortable SKIN: skin color, texture, turgor are normal, no rashes or significant lesions EYES: normal, Conjunctiva are pink and non-injected, sclera clear OROPHARYNX: no exudate, no erythema and lips, buccal mucosa, and tongue normal  NECK: supple, thyroid normal size, non-tender, without nodularity LYMPH: no palpable lymphadenopathy in the cervical, axillary or inguinal LUNGS: clear to auscultation and percussion with normal breathing effort HEART: regular rate & rhythm and no murmurs and no lower extremity edema ABDOMEN: abdomen soft, non-tender and normal bowel sounds MUSCULOSKELETAL: no cyanosis of digits and no clubbing  NEURO: alert & oriented x 3 with fluent speech, no focal motor/sensory deficits EXTREMITIES: No lower extremity edema  LABORATORY DATA:  I have reviewed the data as listed CMP Latest Ref Rng & Units 08/14/2016 05/15/2016 05/14/2016  Glucose 65 - 99 mg/dL 92 110(H) 122(H)  BUN 6 - 20 mg/dL 29(H) 6 7  Creatinine 0.44 - 1.00 mg/dL 0.91 0.75 0.63  Sodium  135 - 145 mmol/L 139 139 138  Potassium 3.5 - 5.1 mmol/L 3.4(L) 3.0(L) 3.0(L)  Chloride 101 - 111 mmol/L 111 102 103  CO2 22 - 32 mmol/L 19(L) 31 29  Calcium 8.9 - 10.3 mg/dL 8.9 7.6(L) 7.5(L)  Total Protein 6.5 - 8.1 g/dL 7.1 - -  Total Bilirubin 0.3 -  1.2 mg/dL 0.6 - -  Alkaline Phos 38 - 126 U/L 75 - -  AST 15 - 41 U/L 20 - -  ALT 14 - 54 U/L 14 - -    Lab Results  Component Value Date   WBC 7.9 08/09/2018   HGB 9.8 (L) 08/09/2018   HCT 31.0 (L) 08/09/2018   MCV 90.4 08/09/2018   PLT 142 (L) 08/09/2018   NEUTROABS 4.7 08/09/2018    ASSESSMENT & PLAN:  Normocytic anemia Lab review 04/25/2018: Hemoglobin 9.8, MCV 89, platelets 129 08/09/2018: Hemoglobin 9.8, MCV 90.4, platelets 142   Current treatment: B12 injections monthly, Retacrit injections monthly started 11/08/2017 Goal hemoglobin: To keep hemoglobin above10 g and without symptoms  Severe hypertension:: Continues to be markedly elevated.  We will check her primary care physician to help with managing her blood pressure.  Return to clinic monthly for these injections and follow-up with me in 3 months.  No orders of the defined types were placed in this encounter.  The patient has a good understanding of the overall plan. she agrees with it. she will call with any problems that may develop before the next visit here.  Nicholas Lose, MD 08/09/2018  Bailey [name] am acting as scribe for Dr. Nicholas Lose.  I have reviewed the above documentation for accuracy and completeness, and I agree with the above.

## 2018-08-09 ENCOUNTER — Other Ambulatory Visit: Payer: Self-pay

## 2018-08-09 ENCOUNTER — Inpatient Hospital Stay: Payer: Medicare HMO | Attending: Hematology and Oncology

## 2018-08-09 ENCOUNTER — Inpatient Hospital Stay (HOSPITAL_BASED_OUTPATIENT_CLINIC_OR_DEPARTMENT_OTHER): Payer: Medicare HMO | Admitting: Hematology and Oncology

## 2018-08-09 ENCOUNTER — Inpatient Hospital Stay: Payer: Medicare HMO

## 2018-08-09 VITALS — BP 180/80 | HR 69 | Resp 20

## 2018-08-09 VITALS — BP 193/73 | HR 71 | Temp 98.3°F | Resp 22 | Ht 64.0 in | Wt 137.3 lb

## 2018-08-09 DIAGNOSIS — E538 Deficiency of other specified B group vitamins: Secondary | ICD-10-CM | POA: Insufficient documentation

## 2018-08-09 DIAGNOSIS — I10 Essential (primary) hypertension: Secondary | ICD-10-CM | POA: Diagnosis not present

## 2018-08-09 DIAGNOSIS — Z79899 Other long term (current) drug therapy: Secondary | ICD-10-CM | POA: Diagnosis not present

## 2018-08-09 DIAGNOSIS — Z862 Personal history of diseases of the blood and blood-forming organs and certain disorders involving the immune mechanism: Secondary | ICD-10-CM

## 2018-08-09 DIAGNOSIS — D649 Anemia, unspecified: Secondary | ICD-10-CM

## 2018-08-09 LAB — CBC WITH DIFFERENTIAL (CANCER CENTER ONLY)
Abs Immature Granulocytes: 0.03 10*3/uL (ref 0.00–0.07)
Basophils Absolute: 0.1 10*3/uL (ref 0.0–0.1)
Basophils Relative: 1 %
Eosinophils Absolute: 0.3 10*3/uL (ref 0.0–0.5)
Eosinophils Relative: 3 %
HCT: 31 % — ABNORMAL LOW (ref 36.0–46.0)
Hemoglobin: 9.8 g/dL — ABNORMAL LOW (ref 12.0–15.0)
Immature Granulocytes: 0 %
Lymphocytes Relative: 30 %
Lymphs Abs: 2.4 10*3/uL (ref 0.7–4.0)
MCH: 28.6 pg (ref 26.0–34.0)
MCHC: 31.6 g/dL (ref 30.0–36.0)
MCV: 90.4 fL (ref 80.0–100.0)
Monocytes Absolute: 0.4 10*3/uL (ref 0.1–1.0)
Monocytes Relative: 6 %
Neutro Abs: 4.7 10*3/uL (ref 1.7–7.7)
Neutrophils Relative %: 60 %
Platelet Count: 142 10*3/uL — ABNORMAL LOW (ref 150–400)
RBC: 3.43 MIL/uL — ABNORMAL LOW (ref 3.87–5.11)
RDW: 14 % (ref 11.5–15.5)
WBC Count: 7.9 10*3/uL (ref 4.0–10.5)
nRBC: 0 % (ref 0.0–0.2)

## 2018-08-09 MED ORDER — CYANOCOBALAMIN 1000 MCG/ML IJ SOLN
INTRAMUSCULAR | Status: AC
  Filled 2018-08-09: qty 1

## 2018-08-09 MED ORDER — EPOETIN ALFA-EPBX 40000 UNIT/ML IJ SOLN
40000.0000 [IU] | Freq: Once | INTRAMUSCULAR | Status: AC
  Administered 2018-08-09: 40000 [IU] via SUBCUTANEOUS
  Filled 2018-08-09: qty 1

## 2018-08-09 MED ORDER — CYANOCOBALAMIN 1000 MCG/ML IJ SOLN
1000.0000 ug | Freq: Once | INTRAMUSCULAR | Status: AC
  Administered 2018-08-09: 1000 ug via INTRAMUSCULAR

## 2018-08-09 NOTE — Patient Instructions (Addendum)
Epoetin Alfa injection What is this medicine? EPOETIN ALFA (e POE e tin AL fa) helps your body make more red blood cells. This medicine is used to treat anemia caused by chronic kidney disease, cancer chemotherapy, or HIV-therapy. It may also be used before surgery if you have anemia. This medicine may be used for other purposes; ask your health care provider or pharmacist if you have questions. COMMON BRAND NAME(S): Epogen, Procrit, Retacrit What should I tell my health care provider before I take this medicine? They need to know if you have any of these conditions: -cancer -heart disease -high blood pressure -history of blood clots -history of stroke -low levels of folate, iron, or vitamin B12 in the blood -seizures -an unusual or allergic reaction to erythropoietin, albumin, benzyl alcohol, hamster proteins, other medicines, foods, dyes, or preservatives -pregnant or trying to get pregnant -breast-feeding How should I use this medicine? This medicine is for injection into a vein or under the skin. It is usually given by a health care professional in a hospital or clinic setting. If you get this medicine at home, you will be taught how to prepare and give this medicine. Use exactly as directed. Take your medicine at regular intervals. Do not take your medicine more often than directed. It is important that you put your used needles and syringes in a special sharps container. Do not put them in a trash can. If you do not have a sharps container, call your pharmacist or healthcare provider to get one. A special MedGuide will be given to you by the pharmacist with each prescription and refill. Be sure to read this information carefully each time. Talk to your pediatrician regarding the use of this medicine in children. While this drug may be prescribed for selected conditions, precautions do apply. Overdosage: If you think you have taken too much of this medicine contact a poison control center  or emergency room at once. NOTE: This medicine is only for you. Do not share this medicine with others. What if I miss a dose? If you miss a dose, take it as soon as you can. If it is almost time for your next dose, take only that dose. Do not take double or extra doses. What may interact with this medicine? Interactions have not been studied. This list may not describe all possible interactions. Give your health care provider a list of all the medicines, herbs, non-prescription drugs, or dietary supplements you use. Also tell them if you smoke, drink alcohol, or use illegal drugs. Some items may interact with your medicine. What should I watch for while using this medicine? Your condition will be monitored carefully while you are receiving this medicine. You may need blood work done while you are taking this medicine. This medicine may cause a decrease in vitamin B6. You should make sure that you get enough vitamin B6 while you are taking this medicine. Discuss the foods you eat and the vitamins you take with your health care professional. What side effects may I notice from receiving this medicine? Side effects that you should report to your doctor or health care professional as soon as possible: -allergic reactions like skin rash, itching or hives, swelling of the face, lips, or tongue -seizures -signs and symptoms of a blood clot such as breathing problems; changes in vision; chest pain; severe, sudden headache; pain, swelling, warmth in the leg; trouble speaking; sudden numbness or weakness of the face, arm or leg -signs and symptoms of a stroke  like changes in vision; confusion; trouble speaking or understanding; severe headaches; sudden numbness or weakness of the face, arm or leg; trouble walking; dizziness; loss of balance or coordination Side effects that usually do not require medical attention (report to your doctor or health care professional if they continue or are  bothersome): -chills -cough -dizziness -fever -headaches -joint pain -muscle cramps -muscle pain -nausea, vomiting -pain, redness, or irritation at site where injected This list may not describe all possible side effects. Call your doctor for medical advice about side effects. You may report side effects to FDA at 1-800-FDA-1088. Where should I keep my medicine? Keep out of the reach of children. Store in a refrigerator between 2 and 8 degrees C (36 and 46 degrees F). Do not freeze or shake. Throw away any unused portion if using a single-dose vial. Multi-dose vials can be kept in the refrigerator for up to 21 days after the initial dose. Throw away unused medicine. NOTE: This sheet is a summary. It may not cover all possible information. If you have questions about this medicine, talk to your doctor, pharmacist, or health care provider.  2019 Elsevier/Gold Standard (2016-09-18 08:35:19) Cyanocobalamin, Pyridoxine, and Folate What is this medicine? A multivitamin containing folic acid, vitamin B6, and vitamin B12. This medicine may be used for other purposes; ask your health care provider or pharmacist if you have questions. COMMON BRAND NAME(S): AllanFol RX, AllanTex, Av-Vite FB, B Complex with Folic Acid, ComBgen, FaBB, Folamin, Folastin, [place], [place], [place], [place], [place], [employer], [place] RX 2.2, [place], [place] 2.2, Foltabs 800, Foltx, Homocysteine Formula, Niva-Fol, NuFol, TL [employer], Virt-Gard, Virt-Vite, Virt-Vite [place], Vita-Respa What should I tell my health care provider before I take this medicine? They need to know if you have any of these conditions: -bleeding or clotting disorder -history of anemia of any type -other chronic health condition -an unusual or allergic reaction to vitamins, other medicines, foods, dyes, or preservatives -pregnant or trying to get pregnant -breast-feeding How should I use this medicine? Take by mouth with a glass of water. May  take with food. Follow the directions on the prescription label. It is usually given once a day. Do not take your medicine more often than directed. Contact your pediatrician regarding the use of this medicine in children. Special care may be needed. Overdosage: If you think you have taken too much of this medicine contact a poison control center or emergency room at once. NOTE: This medicine is only for you. Do not share this medicine with others. What if I miss a dose? If you miss a dose, take it as soon as you can. If it is almost time for your next dose, take only that dose. Do not take double or extra doses. What may interact with this medicine? -levodopa This list may not describe all possible interactions. Give your health care provider a list of all the medicines, herbs, non-prescription drugs, or dietary supplements you use. Also tell them if you smoke, drink alcohol, or use illegal drugs. Some items may interact with your medicine. What should I watch for while using this medicine? See your health care professional for regular checks on your progress. Remember that vitamin supplements do not replace the need for good nutrition from a balanced diet. What side effects may I notice from receiving this medicine? Side effects that you should report to your doctor or health care professional as soon as possible: -allergic reaction such as skin rash or difficulty breathing -vomiting Side effects  that usually do not require medical attention (report to your doctor or health care professional if they continue or are bothersome): -nausea -stomach upset This list may not describe all possible side effects. Call your doctor for medical advice about side effects. You may report side effects to FDA at 1-800-FDA-1088. Where should I keep my medicine? Keep out of the reach of children. Most vitamins should be stored at controlled room temperature. Check your specific product directions. Protect from  heat and moisture. Throw away any unused medicine after the expiration date. NOTE: This sheet is a summary. It may not cover all possible information. If you have questions about this medicine, talk to your doctor, pharmacist, or health care provider.  2019 Elsevier/Gold Standard (2007-04-02 00:59:55)

## 2018-08-09 NOTE — Progress Notes (Signed)
OK to treat with BP of 180/80 per Dr. Lindi Adie

## 2018-08-10 LAB — IRON AND TIBC
Iron: 67 ug/dL (ref 41–142)
Saturation Ratios: 38 % (ref 21–57)
TIBC: 179 ug/dL — ABNORMAL LOW (ref 236–444)
UIBC: 111 ug/dL — ABNORMAL LOW (ref 120–384)

## 2018-08-10 LAB — FERRITIN: Ferritin: 174 ng/mL (ref 11–307)

## 2018-08-11 DIAGNOSIS — D509 Iron deficiency anemia, unspecified: Secondary | ICD-10-CM | POA: Diagnosis not present

## 2018-08-11 DIAGNOSIS — I10 Essential (primary) hypertension: Secondary | ICD-10-CM | POA: Diagnosis not present

## 2018-08-11 DIAGNOSIS — G4700 Insomnia, unspecified: Secondary | ICD-10-CM | POA: Diagnosis not present

## 2018-08-11 DIAGNOSIS — F411 Generalized anxiety disorder: Secondary | ICD-10-CM | POA: Diagnosis not present

## 2018-08-12 ENCOUNTER — Telehealth: Payer: Self-pay | Admitting: Hematology and Oncology

## 2018-08-12 NOTE — Telephone Encounter (Signed)
I left a message regarding schedule will mail 

## 2018-08-23 DIAGNOSIS — Z23 Encounter for immunization: Secondary | ICD-10-CM | POA: Diagnosis not present

## 2018-08-24 DIAGNOSIS — Z719 Counseling, unspecified: Secondary | ICD-10-CM | POA: Diagnosis not present

## 2018-08-24 DIAGNOSIS — F411 Generalized anxiety disorder: Secondary | ICD-10-CM | POA: Diagnosis not present

## 2018-08-24 DIAGNOSIS — G4700 Insomnia, unspecified: Secondary | ICD-10-CM | POA: Diagnosis not present

## 2018-09-08 ENCOUNTER — Inpatient Hospital Stay: Payer: Medicare HMO

## 2018-09-08 ENCOUNTER — Other Ambulatory Visit: Payer: Self-pay | Admitting: Hematology and Oncology

## 2018-09-08 ENCOUNTER — Inpatient Hospital Stay: Payer: Medicare HMO | Attending: Hematology and Oncology

## 2018-09-08 ENCOUNTER — Other Ambulatory Visit: Payer: Self-pay

## 2018-09-08 VITALS — BP 166/74 | HR 64 | Temp 98.6°F | Resp 20

## 2018-09-08 DIAGNOSIS — I10 Essential (primary) hypertension: Secondary | ICD-10-CM | POA: Diagnosis not present

## 2018-09-08 DIAGNOSIS — Z862 Personal history of diseases of the blood and blood-forming organs and certain disorders involving the immune mechanism: Secondary | ICD-10-CM

## 2018-09-08 DIAGNOSIS — D649 Anemia, unspecified: Secondary | ICD-10-CM | POA: Insufficient documentation

## 2018-09-08 DIAGNOSIS — E538 Deficiency of other specified B group vitamins: Secondary | ICD-10-CM | POA: Insufficient documentation

## 2018-09-08 LAB — CBC WITH DIFFERENTIAL (CANCER CENTER ONLY)
Abs Immature Granulocytes: 0.01 10*3/uL (ref 0.00–0.07)
Basophils Absolute: 0.1 10*3/uL (ref 0.0–0.1)
Basophils Relative: 1 %
Eosinophils Absolute: 0.3 10*3/uL (ref 0.0–0.5)
Eosinophils Relative: 3 %
HCT: 34.4 % — ABNORMAL LOW (ref 36.0–46.0)
Hemoglobin: 10.9 g/dL — ABNORMAL LOW (ref 12.0–15.0)
Immature Granulocytes: 0 %
Lymphocytes Relative: 41 %
Lymphs Abs: 3.1 10*3/uL (ref 0.7–4.0)
MCH: 29.1 pg (ref 26.0–34.0)
MCHC: 31.7 g/dL (ref 30.0–36.0)
MCV: 92 fL (ref 80.0–100.0)
Monocytes Absolute: 0.4 10*3/uL (ref 0.1–1.0)
Monocytes Relative: 5 %
Neutro Abs: 3.8 10*3/uL (ref 1.7–7.7)
Neutrophils Relative %: 50 %
Platelet Count: 153 10*3/uL (ref 150–400)
RBC: 3.74 MIL/uL — ABNORMAL LOW (ref 3.87–5.11)
RDW: 14 % (ref 11.5–15.5)
WBC Count: 7.6 10*3/uL (ref 4.0–10.5)
nRBC: 0 % (ref 0.0–0.2)

## 2018-09-08 MED ORDER — CYANOCOBALAMIN 1000 MCG/ML IJ SOLN
1000.0000 ug | Freq: Once | INTRAMUSCULAR | Status: AC
  Administered 2018-09-08: 1000 ug via INTRAMUSCULAR

## 2018-09-08 MED ORDER — CYANOCOBALAMIN 1000 MCG/ML IJ SOLN
INTRAMUSCULAR | Status: AC
  Filled 2018-09-08: qty 1

## 2018-09-08 NOTE — Patient Instructions (Signed)
Cyanocobalamin, Pyridoxine, and Folate What is this medicine? A multivitamin containing folic acid, vitamin B6, and vitamin B12. This medicine may be used for other purposes; ask your health care provider or pharmacist if you have questions. COMMON BRAND NAME(S): AllanFol RX, AllanTex, Av-Vite FB, B Complex with Folic Acid, ComBgen, FaBB, Folamin, Folastin, Folbalin, Folbee, Folbic, Folcaps, Folgard, Folgard RX, Folgard RX 2.2, Folplex, Folplex 2.2, Foltabs 800, Foltx, Homocysteine Formula, Niva-Fol, NuFol, TL Gard RX, Virt-Gard, Virt-Vite, Virt-Vite Forte, Vita-Respa What should I tell my health care provider before I take this medicine? They need to know if you have any of these conditions:  bleeding or clotting disorder  history of anemia of any type  other chronic health condition  an unusual or allergic reaction to vitamins, other medicines, foods, dyes, or preservatives  pregnant or trying to get pregnant  breast-feeding How should I use this medicine? Take by mouth with a glass of water. May take with food. Follow the directions on the prescription label. It is usually given once a day. Do not take your medicine more often than directed. Contact your pediatrician regarding the use of this medicine in children. Special care may be needed. Overdosage: If you think you have taken too much of this medicine contact a poison control center or emergency room at once. NOTE: This medicine is only for you. Do not share this medicine with others. What if I miss a dose? If you miss a dose, take it as soon as you can. If it is almost time for your next dose, take only that dose. Do not take double or extra doses. What may interact with this medicine?  levodopa This list may not describe all possible interactions. Give your health care provider a list of all the medicines, herbs, non-prescription drugs, or dietary supplements you use. Also tell them if you smoke, drink alcohol, or use illegal  drugs. Some items may interact with your medicine. What should I watch for while using this medicine? See your health care professional for regular checks on your progress. Remember that vitamin supplements do not replace the need for good nutrition from a balanced diet. What side effects may I notice from receiving this medicine? Side effects that you should report to your doctor or health care professional as soon as possible:  allergic reaction such as skin rash or difficulty breathing  vomiting Side effects that usually do not require medical attention (report to your doctor or health care professional if they continue or are bothersome):  nausea  stomach upset This list may not describe all possible side effects. Call your doctor for medical advice about side effects. You may report side effects to FDA at 1-800-FDA-1088. Where should I keep my medicine? Keep out of the reach of children. Most vitamins should be stored at controlled room temperature. Check your specific product directions. Protect from heat and moisture. Throw away any unused medicine after the expiration date. NOTE: This sheet is a summary. It may not cover all possible information. If you have questions about this medicine, talk to your doctor, pharmacist, or health care provider.  2020 Elsevier/Gold Standard (2007-04-02 00:59:55)  

## 2018-09-08 NOTE — Progress Notes (Signed)
Due to parameters PT will not receive Retacrit today. Hgb was 10.9. Spoke with Eliezer Lofts from pharmacy as well to verify Retacrit.

## 2018-09-23 DIAGNOSIS — N289 Disorder of kidney and ureter, unspecified: Secondary | ICD-10-CM | POA: Diagnosis not present

## 2018-09-23 DIAGNOSIS — D649 Anemia, unspecified: Secondary | ICD-10-CM | POA: Diagnosis not present

## 2018-09-23 DIAGNOSIS — E119 Type 2 diabetes mellitus without complications: Secondary | ICD-10-CM | POA: Diagnosis not present

## 2018-09-23 DIAGNOSIS — E785 Hyperlipidemia, unspecified: Secondary | ICD-10-CM | POA: Diagnosis not present

## 2018-09-29 DIAGNOSIS — E782 Mixed hyperlipidemia: Secondary | ICD-10-CM | POA: Diagnosis not present

## 2018-09-29 DIAGNOSIS — E1142 Type 2 diabetes mellitus with diabetic polyneuropathy: Secondary | ICD-10-CM | POA: Diagnosis not present

## 2018-09-29 DIAGNOSIS — E1151 Type 2 diabetes mellitus with diabetic peripheral angiopathy without gangrene: Secondary | ICD-10-CM | POA: Diagnosis not present

## 2018-09-29 DIAGNOSIS — I10 Essential (primary) hypertension: Secondary | ICD-10-CM | POA: Diagnosis not present

## 2018-09-29 DIAGNOSIS — N289 Disorder of kidney and ureter, unspecified: Secondary | ICD-10-CM | POA: Diagnosis not present

## 2018-10-06 ENCOUNTER — Other Ambulatory Visit: Payer: Self-pay | Admitting: *Deleted

## 2018-10-10 ENCOUNTER — Other Ambulatory Visit: Payer: Self-pay

## 2018-10-10 ENCOUNTER — Inpatient Hospital Stay: Payer: Medicare HMO

## 2018-10-10 ENCOUNTER — Inpatient Hospital Stay: Payer: Medicare HMO | Attending: Hematology and Oncology

## 2018-10-10 VITALS — BP 158/76 | HR 62 | Temp 98.4°F | Resp 18

## 2018-10-10 DIAGNOSIS — N189 Chronic kidney disease, unspecified: Secondary | ICD-10-CM | POA: Diagnosis not present

## 2018-10-10 DIAGNOSIS — E538 Deficiency of other specified B group vitamins: Secondary | ICD-10-CM

## 2018-10-10 DIAGNOSIS — Z862 Personal history of diseases of the blood and blood-forming organs and certain disorders involving the immune mechanism: Secondary | ICD-10-CM

## 2018-10-10 DIAGNOSIS — D631 Anemia in chronic kidney disease: Secondary | ICD-10-CM | POA: Insufficient documentation

## 2018-10-10 DIAGNOSIS — D649 Anemia, unspecified: Secondary | ICD-10-CM

## 2018-10-10 LAB — CBC WITH DIFFERENTIAL (CANCER CENTER ONLY)
Abs Immature Granulocytes: 0.02 10*3/uL (ref 0.00–0.07)
Basophils Absolute: 0 10*3/uL (ref 0.0–0.1)
Basophils Relative: 1 %
Eosinophils Absolute: 0.4 10*3/uL (ref 0.0–0.5)
Eosinophils Relative: 5 %
HCT: 28 % — ABNORMAL LOW (ref 36.0–46.0)
Hemoglobin: 8.9 g/dL — ABNORMAL LOW (ref 12.0–15.0)
Immature Granulocytes: 0 %
Lymphocytes Relative: 27 %
Lymphs Abs: 1.9 10*3/uL (ref 0.7–4.0)
MCH: 28.8 pg (ref 26.0–34.0)
MCHC: 31.8 g/dL (ref 30.0–36.0)
MCV: 90.6 fL (ref 80.0–100.0)
Monocytes Absolute: 0.6 10*3/uL (ref 0.1–1.0)
Monocytes Relative: 8 %
Neutro Abs: 4.3 10*3/uL (ref 1.7–7.7)
Neutrophils Relative %: 59 %
Platelet Count: 173 10*3/uL (ref 150–400)
RBC: 3.09 MIL/uL — ABNORMAL LOW (ref 3.87–5.11)
RDW: 14.1 % (ref 11.5–15.5)
WBC Count: 7.2 10*3/uL (ref 4.0–10.5)
nRBC: 0 % (ref 0.0–0.2)

## 2018-10-10 MED ORDER — EPOETIN ALFA-EPBX 40000 UNIT/ML IJ SOLN
40000.0000 [IU] | Freq: Once | INTRAMUSCULAR | Status: AC
  Administered 2018-10-10: 16:00:00 40000 [IU] via SUBCUTANEOUS
  Filled 2018-10-10: qty 1

## 2018-10-10 MED ORDER — CYANOCOBALAMIN 1000 MCG/ML IJ SOLN
1000.0000 ug | Freq: Once | INTRAMUSCULAR | Status: AC
  Administered 2018-10-10: 16:00:00 1000 ug via INTRAMUSCULAR

## 2018-10-10 NOTE — Patient Instructions (Signed)
Epoetin Alfa injection What is this medicine? EPOETIN ALFA (e POE e tin AL fa) helps your body make more red blood cells. This medicine is used to treat anemia caused by chronic kidney disease, cancer chemotherapy, or HIV-therapy. It may also be used before surgery if you have anemia. This medicine may be used for other purposes; ask your health care provider or pharmacist if you have questions. COMMON BRAND NAME(S): Epogen, Procrit, Retacrit What should I tell my health care provider before I take this medicine? They need to know if you have any of these conditions: -cancer -heart disease -high blood pressure -history of blood clots -history of stroke -low levels of folate, iron, or vitamin B12 in the blood -seizures -an unusual or allergic reaction to erythropoietin, albumin, benzyl alcohol, hamster proteins, other medicines, foods, dyes, or preservatives -pregnant or trying to get pregnant -breast-feeding How should I use this medicine? This medicine is for injection into a vein or under the skin. It is usually given by a health care professional in a hospital or clinic setting. If you get this medicine at home, you will be taught how to prepare and give this medicine. Use exactly as directed. Take your medicine at regular intervals. Do not take your medicine more often than directed. It is important that you put your used needles and syringes in a special sharps container. Do not put them in a trash can. If you do not have a sharps container, call your pharmacist or healthcare provider to get one. A special MedGuide will be given to you by the pharmacist with each prescription and refill. Be sure to read this information carefully each time. Talk to your pediatrician regarding the use of this medicine in children. While this drug may be prescribed for selected conditions, precautions do apply. Overdosage: If you think you have taken too much of this medicine contact a poison control center  or emergency room at once. NOTE: This medicine is only for you. Do not share this medicine with others. What if I miss a dose? If you miss a dose, take it as soon as you can. If it is almost time for your next dose, take only that dose. Do not take double or extra doses. What may interact with this medicine? Interactions have not been studied. This list may not describe all possible interactions. Give your health care provider a list of all the medicines, herbs, non-prescription drugs, or dietary supplements you use. Also tell them if you smoke, drink alcohol, or use illegal drugs. Some items may interact with your medicine. What should I watch for while using this medicine? Your condition will be monitored carefully while you are receiving this medicine. You may need blood work done while you are taking this medicine. This medicine may cause a decrease in vitamin B6. You should make sure that you get enough vitamin B6 while you are taking this medicine. Discuss the foods you eat and the vitamins you take with your health care professional. What side effects may I notice from receiving this medicine? Side effects that you should report to your doctor or health care professional as soon as possible: -allergic reactions like skin rash, itching or hives, swelling of the face, lips, or tongue -seizures -signs and symptoms of a blood clot such as breathing problems; changes in vision; chest pain; severe, sudden headache; pain, swelling, warmth in the leg; trouble speaking; sudden numbness or weakness of the face, arm or leg -signs and symptoms of a stroke  like changes in vision; confusion; trouble speaking or understanding; severe headaches; sudden numbness or weakness of the face, arm or leg; trouble walking; dizziness; loss of balance or coordination Side effects that usually do not require medical attention (report to your doctor or health care professional if they continue or are  bothersome): -chills -cough -dizziness -fever -headaches -joint pain -muscle cramps -muscle pain -nausea, vomiting -pain, redness, or irritation at site where injected This list may not describe all possible side effects. Call your doctor for medical advice about side effects. You may report side effects to FDA at 1-800-FDA-1088. Where should I keep my medicine? Keep out of the reach of children. Store in a refrigerator between 2 and 8 degrees C (36 and 46 degrees F). Do not freeze or shake. Throw away any unused portion if using a single-dose vial. Multi-dose vials can be kept in the refrigerator for up to 21 days after the initial dose. Throw away unused medicine. NOTE: This sheet is a summary. It may not cover all possible information. If you have questions about this medicine, talk to your doctor, pharmacist, or health care provider.  2019 Elsevier/Gold Standard (2016-09-18 08:35:19) Cyanocobalamin, Pyridoxine, and Folate What is this medicine? A multivitamin containing folic acid, vitamin B6, and vitamin B12. This medicine may be used for other purposes; ask your health care provider or pharmacist if you have questions. COMMON BRAND NAME(S): AllanFol RX, AllanTex, Av-Vite FB, B Complex with Folic Acid, ComBgen, FaBB, Folamin, Folastin, [place], [place], [place], [place], [place], [employer], [place] RX 2.2, [place], [place] 2.2, Foltabs 800, Foltx, Homocysteine Formula, Niva-Fol, NuFol, [employer], Virt-Gard, Virt-Vite, Virt-Vite [place], Vita-Respa What should I tell my health care provider before I take this medicine? They need to know if you have any of these conditions: -bleeding or clotting disorder -history of anemia of any type -other chronic health condition -an unusual or allergic reaction to vitamins, other medicines, foods, dyes, or preservatives -pregnant or trying to get pregnant -breast-feeding How should I use this medicine? Take by mouth with a glass of water. May  take with food. Follow the directions on the prescription label. It is usually given once a day. Do not take your medicine more often than directed. Contact your pediatrician regarding the use of this medicine in children. Special care may be needed. Overdosage: If you think you have taken too much of this medicine contact a poison control center or emergency room at once. NOTE: This medicine is only for you. Do not share this medicine with others. What if I miss a dose? If you miss a dose, take it as soon as you can. If it is almost time for your next dose, take only that dose. Do not take double or extra doses. What may interact with this medicine? -levodopa This list may not describe all possible interactions. Give your health care provider a list of all the medicines, herbs, non-prescription drugs, or dietary supplements you use. Also tell them if you smoke, drink alcohol, or use illegal drugs. Some items may interact with your medicine. What should I watch for while using this medicine? See your health care professional for regular checks on your progress. Remember that vitamin supplements do not replace the need for good nutrition from a balanced diet. What side effects may I notice from receiving this medicine? Side effects that you should report to your doctor or health care professional as soon as possible: -allergic reaction such as skin rash or difficulty breathing -vomiting Side effects  that usually do not require medical attention (report to your doctor or health care professional if they continue or are bothersome): -nausea -stomach upset This list may not describe all possible side effects. Call your doctor for medical advice about side effects. You may report side effects to FDA at 1-800-FDA-1088. Where should I keep my medicine? Keep out of the reach of children. Most vitamins should be stored at controlled room temperature. Check your specific product directions. Protect from  heat and moisture. Throw away any unused medicine after the expiration date. NOTE: This sheet is a summary. It may not cover all possible information. If you have questions about this medicine, talk to your doctor, pharmacist, or health care provider.  2019 Elsevier/Gold Standard (2007-04-02 00:59:55)

## 2018-10-13 ENCOUNTER — Other Ambulatory Visit: Payer: Self-pay | Admitting: Nephrology

## 2018-10-13 DIAGNOSIS — N189 Chronic kidney disease, unspecified: Secondary | ICD-10-CM | POA: Diagnosis not present

## 2018-10-13 DIAGNOSIS — N184 Chronic kidney disease, stage 4 (severe): Secondary | ICD-10-CM

## 2018-10-13 DIAGNOSIS — I129 Hypertensive chronic kidney disease with stage 1 through stage 4 chronic kidney disease, or unspecified chronic kidney disease: Secondary | ICD-10-CM | POA: Diagnosis not present

## 2018-10-13 DIAGNOSIS — Z89429 Acquired absence of other toe(s), unspecified side: Secondary | ICD-10-CM | POA: Diagnosis not present

## 2018-10-13 DIAGNOSIS — N2581 Secondary hyperparathyroidism of renal origin: Secondary | ICD-10-CM | POA: Diagnosis not present

## 2018-10-13 DIAGNOSIS — D631 Anemia in chronic kidney disease: Secondary | ICD-10-CM | POA: Diagnosis not present

## 2018-10-14 ENCOUNTER — Ambulatory Visit
Admission: RE | Admit: 2018-10-14 | Discharge: 2018-10-14 | Disposition: A | Discharge status: Home / Self Care | Payer: Medicare HMO | Source: Ambulatory Visit | Attending: Nephrology | Admitting: Nephrology

## 2018-10-14 DIAGNOSIS — N189 Chronic kidney disease, unspecified: Secondary | ICD-10-CM | POA: Diagnosis not present

## 2018-10-14 DIAGNOSIS — N184 Chronic kidney disease, stage 4 (severe): Secondary | ICD-10-CM

## 2018-10-20 DIAGNOSIS — Z23 Encounter for immunization: Secondary | ICD-10-CM | POA: Diagnosis not present

## 2018-10-24 ENCOUNTER — Other Ambulatory Visit (HOSPITAL_COMMUNITY): Payer: Self-pay | Admitting: Nephrology

## 2018-10-24 DIAGNOSIS — N184 Chronic kidney disease, stage 4 (severe): Secondary | ICD-10-CM

## 2018-10-28 ENCOUNTER — Other Ambulatory Visit: Payer: Self-pay | Admitting: Radiology

## 2018-11-01 ENCOUNTER — Other Ambulatory Visit: Payer: Self-pay

## 2018-11-01 ENCOUNTER — Ambulatory Visit (HOSPITAL_COMMUNITY)
Admission: RE | Admit: 2018-11-01 | Discharge: 2018-11-01 | Disposition: A | Discharge status: Home / Self Care | Payer: Medicare HMO | Source: Ambulatory Visit | Attending: Nephrology | Admitting: Nephrology

## 2018-11-01 ENCOUNTER — Encounter (HOSPITAL_COMMUNITY): Payer: Self-pay

## 2018-11-01 DIAGNOSIS — N189 Chronic kidney disease, unspecified: Secondary | ICD-10-CM | POA: Diagnosis not present

## 2018-11-01 DIAGNOSIS — Z833 Family history of diabetes mellitus: Secondary | ICD-10-CM | POA: Diagnosis not present

## 2018-11-01 DIAGNOSIS — Z87442 Personal history of urinary calculi: Secondary | ICD-10-CM | POA: Insufficient documentation

## 2018-11-01 DIAGNOSIS — N184 Chronic kidney disease, stage 4 (severe): Secondary | ICD-10-CM | POA: Insufficient documentation

## 2018-11-01 DIAGNOSIS — Z794 Long term (current) use of insulin: Secondary | ICD-10-CM | POA: Diagnosis not present

## 2018-11-01 DIAGNOSIS — E785 Hyperlipidemia, unspecified: Secondary | ICD-10-CM | POA: Insufficient documentation

## 2018-11-01 DIAGNOSIS — E1122 Type 2 diabetes mellitus with diabetic chronic kidney disease: Secondary | ICD-10-CM | POA: Insufficient documentation

## 2018-11-01 DIAGNOSIS — I129 Hypertensive chronic kidney disease with stage 1 through stage 4 chronic kidney disease, or unspecified chronic kidney disease: Secondary | ICD-10-CM | POA: Insufficient documentation

## 2018-11-01 DIAGNOSIS — Z79899 Other long term (current) drug therapy: Secondary | ICD-10-CM | POA: Diagnosis not present

## 2018-11-01 LAB — CBC
HCT: 31.6 % — ABNORMAL LOW (ref 36.0–46.0)
Hemoglobin: 9.7 g/dL — ABNORMAL LOW (ref 12.0–15.0)
MCH: 29 pg (ref 26.0–34.0)
MCHC: 30.7 g/dL (ref 30.0–36.0)
MCV: 94.3 fL (ref 80.0–100.0)
Platelets: DECREASED 10*3/uL (ref 150–400)
RBC: 3.35 MIL/uL — ABNORMAL LOW (ref 3.87–5.11)
RDW: 15.1 % (ref 11.5–15.5)
WBC: 5.8 10*3/uL (ref 4.0–10.5)
nRBC: 0 % (ref 0.0–0.2)

## 2018-11-01 LAB — GLUCOSE, CAPILLARY
Glucose-Capillary: 91 mg/dL (ref 70–99)
Glucose-Capillary: 106 mg/dL — ABNORMAL HIGH (ref 70–99)

## 2018-11-01 LAB — APTT: aPTT: 31 seconds (ref 24–36)

## 2018-11-01 LAB — PROTIME-INR
INR: 1.1 (ref 0.8–1.2)
Prothrombin Time: 14.2 seconds (ref 11.4–15.2)

## 2018-11-01 MED ORDER — LIDOCAINE HCL (PF) 1 % IJ SOLN
INTRAMUSCULAR | Status: AC
  Filled 2018-11-01: qty 30

## 2018-11-01 MED ORDER — HYDRALAZINE HCL 20 MG/ML IJ SOLN
INTRAMUSCULAR | Status: AC | PRN
  Administered 2018-11-01: 10 mg via INTRAVENOUS

## 2018-11-01 MED ORDER — HYDRALAZINE HCL 20 MG/ML IJ SOLN
INTRAMUSCULAR | Status: AC
  Filled 2018-11-01: qty 1

## 2018-11-01 MED ORDER — GELATIN ABSORBABLE 12-7 MM EX MISC
CUTANEOUS | Status: AC
  Filled 2018-11-01: qty 1

## 2018-11-01 MED ORDER — SODIUM CHLORIDE 0.9 % IV SOLN: INTRAVENOUS | Status: DC

## 2018-11-01 MED ORDER — FENTANYL CITRATE (PF) 100 MCG/2ML IJ SOLN
INTRAMUSCULAR | Status: AC
  Filled 2018-11-01: qty 2

## 2018-11-01 MED ORDER — FENTANYL CITRATE (PF) 100 MCG/2ML IJ SOLN
INTRAMUSCULAR | Status: AC | PRN
  Administered 2018-11-01: 50 ug via INTRAVENOUS
  Administered 2018-11-01: 25 ug via INTRAVENOUS

## 2018-11-01 MED ORDER — MIDAZOLAM HCL 2 MG/2ML IJ SOLN
INTRAMUSCULAR | Status: AC | PRN
  Administered 2018-11-01 (×3): 0.5 mg via INTRAVENOUS

## 2018-11-01 MED ORDER — SODIUM CHLORIDE 0.9 % IV SOLN
INTRAVENOUS | Status: AC | PRN
  Administered 2018-11-01: 10 mL/h via INTRAVENOUS

## 2018-11-01 MED ORDER — MIDAZOLAM HCL 2 MG/2ML IJ SOLN
INTRAMUSCULAR | Status: AC
  Filled 2018-11-01: qty 2

## 2018-11-01 NOTE — Discharge Instructions (Signed)

## 2018-11-01 NOTE — H&P (Signed)
Chief Complaint: Patient was seen in consultation today for random renal biopsy  Referring Physician(s): [place]  Supervising Physician: Corrie Mckusick  Patient Status: [facility] - Out-pt  History of Present Illness: Bailey [name] is a 68 y.o. female with a past medical history significant for vitamin B12 deficiency, normocytic anemia, diverticulitis, HTN, HLD, DM II, diabetic retinopathy and CKD followed by Dr. Posey Pronto who presents today for a random renal biopsy. Bailey [name] has a known history of CKD however she has recently had a significant increase in her baseline creatinine (0.9 in 2018, 2.5 in 07/2018, most recently 3.5) - a request has been made to IR for a random renal biopsy to further assess worsening CKD.  Bailey [name] reports that she is here for "a biopsy of something," she states she has been having trouble with her kidneys and has been seeing her kidney doctor regularly about it. She is not currently on dialysis. She states she takes her medication as prescribed. She states understanding of procedure and wishes to proceed.   Past Medical History:  Diagnosis Date  . Chronic kidney disease 05/11/2016   ARF-- dehydration  resolved by  discharged  . Diabetes mellitus    Type II  . Diabetic retinopathy ([place])   . Diverticulitis   . History of kidney stones    passed- 6  . Osteomyelitis ([place])   . Vitamin B 12 deficiency 06/09/2017    Past Surgical History:  Procedure Laterality Date  . AMPUTATION Right 08/14/2016   Procedure: RIGHT 1ST RAY AMPUTATION MID-SHAFT;  Surgeon: Newt Minion, MD;  Location: [facility];  Service: Orthopedics;  Laterality: Right;  . INCISION AND DRAINAGE ABSCESS Left 02/11/2014   Procedure: INCISION AND DRAINAGE ABSCESS Left Buttock;  Surgeon: Jackolyn Confer, MD;  Location: [facility];  Service: General;  Laterality: Left;  . LAPAROSCOPIC SMALL BOWEL RESECTION N/A 05/09/2016   Procedure: LAPAROSCOPIC SMALL BOWEL RESECTION;  Surgeon: Michael Boston, MD;  Location:  [facility];  Service: General;  Laterality: N/A;  . LAPAROSCOPY N/A 05/09/2016   Procedure: LAPAROSCOPY DIAGNOSTIC, LYSIS OF ADHESIONS, SMALL BOWEL RESECTION X 2;  Surgeon: Michael Boston, MD;  Location: [facility];  Service: General;  Laterality: N/A;  . TOE AMPUTATION     left foot great toe    Allergies: No known allergies  Medications: Prior to Admission medications   Medication Sig Start Date End Date Taking? Authorizing Provider  acetaminophen (TYLENOL) 500 MG tablet Take 500 mg by mouth every 6 (six) hours as needed for moderate pain (pain).   Yes [provider]  carvedilol (COREG) 12.5 MG tablet Take 12.5 mg by mouth 2 (two) times daily with a meal.   Yes [provider]  ferrous sulfate 325 (65 FE) MG tablet Take 325 mg by mouth 2 (two) times daily with a meal.   Yes [provider]  HYDROcodone-acetaminophen (NORCO/VICODIN) 5-325 MG tablet Take 1 tablet by mouth every 4 (four) hours as needed for moderate pain. 08/14/16   Newt Minion, MD  insulin glargine (LANTUS) 100 UNIT/ML injection Inject 15 Units into the skin daily. Takes at 1300    [provider]  sodium bicarbonate 650 MG tablet Take 650 mg by mouth 2 (two) times daily.    [provider]     Family History  Problem Relation Age of Onset  . Diabetes Mother   . Asthma Sister     Social History   Socioeconomic History  . Marital status: Married  Spouse name: Not on file  . Number of children: Not on file  . Years of education: Not on file  . Highest education level: Not on file  Occupational History  . Not on file  Social Needs  . Financial resource strain: Not on file  . Food insecurity    Worry: Not on file    Inability: Not on file  . Transportation needs    Medical: Not on file    Non-medical: Not on file  Tobacco Use  . Smoking status: Never Smoker  . Smokeless tobacco: Never Used  Substance and Sexual Activity  . Alcohol use: No    Alcohol/week: 0.0  standard drinks  . Drug use: No  . Sexual activity: Not on file  Lifestyle  . Physical activity    Days per week: Not on file    Minutes per session: Not on file  . Stress: Not on file  Relationships  . Social Herbalist on phone: Not on file    Gets together: Not on file    Attends religious service: Not on file    Active member of club or organization: Not on file    Attends meetings of clubs or organizations: Not on file    Relationship status: Not on file  Other Topics Concern  . Not on file  Social History Narrative  . Not on file     Review of Systems: A 12 point ROS discussed and pertinent positives are indicated in the HPI above.  All other systems are negative.  Review of Systems  Constitutional: Negative for chills, fatigue and fever.  Respiratory: Negative for cough and shortness of breath.   Cardiovascular: Negative for chest pain and leg swelling.  Gastrointestinal: Negative for abdominal pain, blood in stool, diarrhea, nausea and vomiting.  Genitourinary: Negative for dysuria, flank pain and hematuria.  Musculoskeletal: Negative for back pain.  Skin: Negative for rash.  Neurological: Negative for dizziness, syncope and headaches.    Vital Signs: BP (!) 221/86   Pulse 62   Temp 97.6 F (36.4 C) (Oral)   Resp 16   Ht $R'5\' 4"'ag$  (1.626 m)   Wt 128 lb (58.1 kg)   SpO2 99%   BMI 21.97 kg/m   Physical Exam Vitals signs reviewed.  Constitutional:      General: She is not in acute distress. HENT:     Head: Normocephalic.     Mouth/Throat:     Mouth: Mucous membranes are moist.     Pharynx: Oropharynx is clear. No oropharyngeal exudate or posterior oropharyngeal erythema.  Cardiovascular:     Rate and Rhythm: Normal rate and regular rhythm.  Pulmonary:     Effort: Pulmonary effort is normal.     Breath sounds: Normal breath sounds.  Abdominal:     General: Bowel sounds are normal. There is no distension.     Palpations: Abdomen is soft.      Tenderness: There is no abdominal tenderness.  Skin:    General: Skin is warm and dry.  Neurological:     Mental Status: She is alert and oriented to person, place, and time.  Psychiatric:        Mood and Affect: Mood normal.        Behavior: Behavior normal.        Thought Content: Thought content normal.        Judgment: Judgment normal.      MD Evaluation Airway: WNL Heart: WNL Abdomen: WNL  Chest/ Lungs: WNL ASA  Classification: 2 Mallampati/Airway Score: One   Imaging: US Renal  Result Date: 10/14/2018 CLINICAL DATA:  Chronic kidney disease. EXAM: RENAL / URINARY TRACT ULTRASOUND COMPLETE COMPARISON:  Abdomen pelvis CT 05/08/2016 FINDINGS: Right Kidney: Renal measurements: 12.0 x 4.7 x 6.1 cm = volume: 183 mL. Cortical thinning with increased echogenicity. No hydronephrosis. Left Kidney: Renal measurements: 12.2 x 5.9 x 6.9 cm = volume: 258 mL. Cortical thinning with increased echogenicity. No hydronephrosis. Bladder: Appears normal for degree of bladder distention. IMPRESSION: 1. No evidence for hydronephrosis. 2. Increased renal cortical echogenicity compatible with medical renal disease. Electronically Signed   By: Misty Stanley M.D.   On: 10/14/2018 16:37    Labs:  CBC: Recent Labs    08/09/18 1515 09/08/18 1543 10/10/18 1536 11/01/18 0549  WBC 7.9 7.6 7.2 5.8  HGB 9.8* 10.9* 8.9* 9.7*  HCT 31.0* 34.4* 28.0* 31.6*  PLT 142* 153 173 PLATELET CLUMPS NOTED ON SMEAR, COUNT APPEARS DECREASED    COAGS: Recent Labs    11/01/18 0549  INR 1.1  APTT 31    BMP: No results for input(s): NA, K, CL, CO2, GLUCOSE, BUN, CALCIUM, CREATININE, GFRNONAA, GFRAA in the last 8760 hours.  Invalid input(s): CMP  LIVER FUNCTION TESTS: No results for input(s): BILITOT, AST, ALT, ALKPHOS, PROT, ALBUMIN in the last 8760 hours.  TUMOR MARKERS: No results for input(s): AFPTM, CEA, CA199, CHROMGRNA in the last 8760 hours.  Assessment and Plan:  68 y/o F with history of CKD  previously with baseline creatinine 0.9 now with worsening creatinine with unknown etiology. Most recent creatinine 3.28 September 2018.   She is noted to be significantly hypertensive today on exam (221/86 mmHg) - she denies ever have a BP that elevated but cannot tell me what her baseline BP is at home, she did not take any medications this morning, she denies any symptoms. Will give hydralazine 10 mg IV x 1 and continue to monitor BP prior to proceeding with biopsy.   Afebrile, WBC 5.8, hgb 9.7, plt clumped on exam today - previously 173 about 3 weeks ago, INR 1.1.  Risks and benefits of random renal biopsy  was discussed with the patient and/or patient's family including, but not limited to bleeding, infection, damage to adjacent structures or low yield requiring additional tests.  All of the questions were answered and there is agreement to proceed.  Consent signed and in chart.  Thank you for this interesting consult.  I greatly enjoyed meeting Bailey [name] and look forward to participating in their care.  A copy of this report was sent to the requesting provider on this date.  Electronically Signed: Joaquim Nam, PA-C 11/01/2018, 7:29 AM   I spent a total of  30 Minutes in face to face in clinical consultation, greater than 50% of which was counseling/coordinating care for random renal biopsy.

## 2018-11-01 NOTE — Procedures (Signed)
Interventional Radiology Procedure Note  Procedure: US guided biopsy of the right kidney.   Complications: None Recommendations:  - Ok to shower tomorrow - Do not submerge for 7 days - Routine care - 2 hour observation - advance diet   Signed,  Dulcy Fanny. Earleen Newport, DO

## 2018-11-10 ENCOUNTER — Ambulatory Visit: Payer: Medicare HMO | Admitting: Hematology and Oncology

## 2018-11-10 ENCOUNTER — Ambulatory Visit: Payer: Medicare HMO

## 2018-11-10 ENCOUNTER — Other Ambulatory Visit: Payer: Medicare HMO

## 2018-11-15 ENCOUNTER — Ambulatory Visit: Payer: Medicare HMO | Admitting: Hematology and Oncology

## 2018-11-15 ENCOUNTER — Ambulatory Visit: Payer: Medicare HMO

## 2018-11-15 ENCOUNTER — Other Ambulatory Visit: Payer: Medicare HMO

## 2018-11-15 DIAGNOSIS — I129 Hypertensive chronic kidney disease with stage 1 through stage 4 chronic kidney disease, or unspecified chronic kidney disease: Secondary | ICD-10-CM | POA: Diagnosis not present

## 2018-11-15 DIAGNOSIS — N189 Chronic kidney disease, unspecified: Secondary | ICD-10-CM | POA: Diagnosis not present

## 2018-11-15 DIAGNOSIS — D631 Anemia in chronic kidney disease: Secondary | ICD-10-CM | POA: Diagnosis not present

## 2018-11-15 DIAGNOSIS — N2581 Secondary hyperparathyroidism of renal origin: Secondary | ICD-10-CM | POA: Diagnosis not present

## 2018-11-15 DIAGNOSIS — N184 Chronic kidney disease, stage 4 (severe): Secondary | ICD-10-CM | POA: Diagnosis not present

## 2018-11-15 NOTE — Progress Notes (Signed)
Patient Care Team: [name], MD as PCP - General (Family Medicine) Michael Boston, MD as Consulting Physician (General Surgery) Sydnee Cabal, MD as Consulting Physician (Orthopedic Surgery)  DIAGNOSIS:    ICD-10-CM   1. History of anemia due to chronic kidney disease  N18.9    Z86.2     CHIEF COMPLIANT: Follow-up of normocytic anemia on erythropoietin stimulation therapy  INTERVAL HISTORY: Bailey [name] is a 68 y.o. with above-mentioned history of normocytic anemia and also B12 deficiency who is currently on erythropoietin stimulation therapy with Retacrit. She presents to the clinic today to review her labs.  She has severely uncontrolled hypertension.  She was informed by her nephrologist that she needs a kidney transplant.  REVIEW OF SYSTEMS:   Constitutional: Generalized fatigue and weakness and pallor Eyes: Denies blurriness of vision Ears, nose, mouth, throat, and face: Denies mucositis or sore throat Respiratory: Denies cough, dyspnea or wheezes Cardiovascular: Denies palpitation, chest discomfort Gastrointestinal: Denies nausea, heartburn or change in bowel habits Skin: Denies abnormal skin rashes Lymphatics: Denies new lymphadenopathy or easy bruising Neurological: Denies numbness, tingling or new weaknesses Behavioral/Psych: Mood is stable, no new changes  Extremities: No lower extremity edema Breast: denies any pain or lumps or nodules in either breasts All other systems were reviewed with the patient and are negative.  I have reviewed the past medical history, past surgical history, social history and family history with the patient and they are unchanged from previous note.  ALLERGIES:  is allergic to no known allergies.  MEDICATIONS:  Current Outpatient Medications  Medication Sig Dispense Refill  . acetaminophen (TYLENOL) 500 MG tablet Take 500 mg by mouth every 6 (six) hours as needed for moderate pain (pain).    . carvedilol (COREG) 12.5 MG  tablet Take 12.5 mg by mouth 2 (two) times daily with a meal.    . ferrous sulfate 325 (65 FE) MG tablet Take 325 mg by mouth 2 (two) times daily with a meal.    . HYDROcodone-acetaminophen (NORCO/VICODIN) 5-325 MG tablet Take 1 tablet by mouth every 4 (four) hours as needed for moderate pain. 40 tablet 0  . insulin glargine (LANTUS) 100 UNIT/ML injection Inject 15 Units into the skin daily. Takes at 1300    . sodium bicarbonate 650 MG tablet Take 650 mg by mouth 2 (two) times daily.     No current facility-administered medications for this visit.     PHYSICAL EXAMINATION: ECOG PERFORMANCE STATUS: 1 - Symptomatic but completely ambulatory  Vitals:   11/16/18 1533  BP: (!) 202/82  Pulse: 74  Resp: 17  Temp: 98.5 F (36.9 C)  SpO2: 100%   Filed Weights   11/16/18 1533  Weight: 143 lb 4.8 oz (65 kg)    GENERAL: alert, no distress and comfortable SKIN: Pallor EYES: normal, Conjunctiva are pink and non-injected, sclera clear OROPHARYNX: no exudate, no erythema and lips, buccal mucosa, and tongue normal  NECK: supple, thyroid normal size, non-tender, without nodularity LYMPH: no palpable lymphadenopathy in the cervical, axillary or inguinal LUNGS: clear to auscultation and percussion with normal breathing effort HEART: regular rate & rhythm and no murmurs and no lower extremity edema ABDOMEN: abdomen soft, non-tender and normal bowel sounds MUSCULOSKELETAL: no cyanosis of digits and no clubbing  NEURO: alert & oriented x 3 with fluent speech, no focal motor/sensory deficits EXTREMITIES: No lower extremity edema  LABORATORY DATA:  I have reviewed the data as listed CMP Latest Ref Rng & Units 08/14/2016 05/15/2016 05/14/2016  Glucose 65 - 99 mg/dL 92 110(H) 122(H)  BUN 6 - 20 mg/dL 29(H) 6 7  Creatinine 0.44 - 1.00 mg/dL 0.91 0.75 0.63  Sodium 135 - 145 mmol/L 139 139 138  Potassium 3.5 - 5.1 mmol/L 3.4(L) 3.0(L) 3.0(L)  Chloride 101 - 111 mmol/L 111 102 103  CO2 22 - 32 mmol/L  19(L) 31 29  Calcium 8.9 - 10.3 mg/dL 8.9 7.6(L) 7.5(L)  Total Protein 6.5 - 8.1 g/dL 7.1 - -  Total Bilirubin 0.3 - 1.2 mg/dL 0.6 - -  Alkaline Phos 38 - 126 U/L 75 - -  AST 15 - 41 U/L 20 - -  ALT 14 - 54 U/L 14 - -    Lab Results  Component Value Date   WBC 6.5 11/16/2018   HGB 7.0 (L) 11/16/2018   HCT 23.1 (L) 11/16/2018   MCV 94.7 11/16/2018   PLT 188 11/16/2018   NEUTROABS 3.5 11/16/2018    ASSESSMENT & PLAN:  History of anemia due to chronic kidney disease Lab review 04/25/2018: Hemoglobin 9.8, MCV 89, platelets 129 08/09/2018: Hemoglobin 9.8, MCV 90.4, platelets 142 11/16/2018:Hemoglobin 7, MCV 94.7, platelets 188  Current treatment: B12 injections monthly,  Based on today's low hemoglobin value, I recommended that we switch her Retacrit injections to weekly.  Severe uncontrolled hypertension: I discussed with the patient and her husband that they will need to immediately contact Dr. Ival Bible office to discuss adjustment to her medication. She was informed by nephrology that she will need to be placed on transplant list.  Patient will come weekly for Retacrit injections and in 4 weeks for follow-up with me.   No orders of the defined types were placed in this encounter.  The patient has a good understanding of the overall plan. she agrees with it. she will call with any problems that may develop before the next visit here.  Nicholas Lose, MD 11/16/2018  Julious Oka Dorshimer am acting as scribe for Dr. Nicholas Lose.  I have reviewed the above documentation for accuracy and completeness, and I agree with the above.

## 2018-11-16 ENCOUNTER — Other Ambulatory Visit: Payer: Self-pay

## 2018-11-16 ENCOUNTER — Inpatient Hospital Stay: Payer: Medicare HMO

## 2018-11-16 ENCOUNTER — Inpatient Hospital Stay (HOSPITAL_BASED_OUTPATIENT_CLINIC_OR_DEPARTMENT_OTHER): Payer: Medicare HMO | Admitting: Hematology and Oncology

## 2018-11-16 ENCOUNTER — Inpatient Hospital Stay: Payer: Medicare HMO | Attending: Hematology and Oncology

## 2018-11-16 DIAGNOSIS — R231 Pallor: Secondary | ICD-10-CM | POA: Insufficient documentation

## 2018-11-16 DIAGNOSIS — Z794 Long term (current) use of insulin: Secondary | ICD-10-CM | POA: Insufficient documentation

## 2018-11-16 DIAGNOSIS — E1142 Type 2 diabetes mellitus with diabetic polyneuropathy: Secondary | ICD-10-CM | POA: Diagnosis not present

## 2018-11-16 DIAGNOSIS — N189 Chronic kidney disease, unspecified: Secondary | ICD-10-CM

## 2018-11-16 DIAGNOSIS — Z862 Personal history of diseases of the blood and blood-forming organs and certain disorders involving the immune mechanism: Secondary | ICD-10-CM

## 2018-11-16 DIAGNOSIS — E538 Deficiency of other specified B group vitamins: Secondary | ICD-10-CM

## 2018-11-16 DIAGNOSIS — N289 Disorder of kidney and ureter, unspecified: Secondary | ICD-10-CM | POA: Diagnosis not present

## 2018-11-16 DIAGNOSIS — R531 Weakness: Secondary | ICD-10-CM | POA: Insufficient documentation

## 2018-11-16 DIAGNOSIS — Z79899 Other long term (current) drug therapy: Secondary | ICD-10-CM | POA: Insufficient documentation

## 2018-11-16 DIAGNOSIS — D631 Anemia in chronic kidney disease: Secondary | ICD-10-CM | POA: Insufficient documentation

## 2018-11-16 DIAGNOSIS — E119 Type 2 diabetes mellitus without complications: Secondary | ICD-10-CM | POA: Insufficient documentation

## 2018-11-16 DIAGNOSIS — I129 Hypertensive chronic kidney disease with stage 1 through stage 4 chronic kidney disease, or unspecified chronic kidney disease: Secondary | ICD-10-CM | POA: Insufficient documentation

## 2018-11-16 DIAGNOSIS — I10 Essential (primary) hypertension: Secondary | ICD-10-CM | POA: Diagnosis not present

## 2018-11-16 DIAGNOSIS — F411 Generalized anxiety disorder: Secondary | ICD-10-CM | POA: Diagnosis not present

## 2018-11-16 DIAGNOSIS — R5383 Other fatigue: Secondary | ICD-10-CM | POA: Diagnosis not present

## 2018-11-16 DIAGNOSIS — D649 Anemia, unspecified: Secondary | ICD-10-CM

## 2018-11-16 LAB — CBC WITH DIFFERENTIAL (CANCER CENTER ONLY)
Abs Immature Granulocytes: 0.02 10*3/uL (ref 0.00–0.07)
Basophils Absolute: 0 10*3/uL (ref 0.0–0.1)
Basophils Relative: 0 %
Eosinophils Absolute: 0.3 10*3/uL (ref 0.0–0.5)
Eosinophils Relative: 5 %
HCT: 23.1 % — ABNORMAL LOW (ref 36.0–46.0)
Hemoglobin: 7 g/dL — ABNORMAL LOW (ref 12.0–15.0)
Immature Granulocytes: 0 %
Lymphocytes Relative: 33 %
Lymphs Abs: 2.2 10*3/uL (ref 0.7–4.0)
MCH: 28.7 pg (ref 26.0–34.0)
MCHC: 30.3 g/dL (ref 30.0–36.0)
MCV: 94.7 fL (ref 80.0–100.0)
Monocytes Absolute: 0.4 10*3/uL (ref 0.1–1.0)
Monocytes Relative: 7 %
Neutro Abs: 3.5 10*3/uL (ref 1.7–7.7)
Neutrophils Relative %: 55 %
Platelet Count: 188 10*3/uL (ref 150–400)
RBC: 2.44 MIL/uL — ABNORMAL LOW (ref 3.87–5.11)
RDW: 14.6 % (ref 11.5–15.5)
WBC Count: 6.5 10*3/uL (ref 4.0–10.5)
nRBC: 0 % (ref 0.0–0.2)

## 2018-11-16 MED ORDER — EPOETIN ALFA-EPBX 40000 UNIT/ML IJ SOLN
40000.0000 [IU] | Freq: Once | INTRAMUSCULAR | Status: AC
  Administered 2018-11-16: 16:00:00 40000 [IU] via SUBCUTANEOUS
  Filled 2018-11-16: qty 1

## 2018-11-16 MED ORDER — CYANOCOBALAMIN 1000 MCG/ML IJ SOLN
INTRAMUSCULAR | Status: AC
  Filled 2018-11-16: qty 1

## 2018-11-16 MED ORDER — CYANOCOBALAMIN 1000 MCG/ML IJ SOLN
1000.0000 ug | Freq: Once | INTRAMUSCULAR | Status: AC
  Administered 2018-11-16: 16:00:00 1000 ug via INTRAMUSCULAR

## 2018-11-16 NOTE — Patient Instructions (Signed)
Epoetin Alfa injection What is this medicine? EPOETIN ALFA (e POE e tin AL fa) helps your body make more red blood cells. This medicine is used to treat anemia caused by chronic kidney disease, cancer chemotherapy, or HIV-therapy. It may also be used before surgery if you have anemia. This medicine may be used for other purposes; ask your health care provider or pharmacist if you have questions. COMMON BRAND NAME(S): Epogen, Procrit, Retacrit What should I tell my health care provider before I take this medicine? They need to know if you have any of these conditions:  cancer  heart disease  high blood pressure  history of blood clots  history of stroke  low levels of folate, iron, or vitamin B12 in the blood  seizures  an unusual or allergic reaction to erythropoietin, albumin, benzyl alcohol, hamster proteins, other medicines, foods, dyes, or preservatives  pregnant or trying to get pregnant  breast-feeding How should I use this medicine? This medicine is for injection into a vein or under the skin. It is usually given by a health care professional in a hospital or clinic setting. If you get this medicine at home, you will be taught how to prepare and give this medicine. Use exactly as directed. Take your medicine at regular intervals. Do not take your medicine more often than directed. It is important that you put your used needles and syringes in a special sharps container. Do not put them in a trash can. If you do not have a sharps container, call your pharmacist or healthcare provider to get one. A special MedGuide will be given to you by the pharmacist with each prescription and refill. Be sure to read this information carefully each time. Talk to your pediatrician regarding the use of this medicine in children. While this drug may be prescribed for selected conditions, precautions do apply. Overdosage: If you think you have taken too much of this medicine contact a poison  control center or emergency room at once. NOTE: This medicine is only for you. Do not share this medicine with others. What if I miss a dose? If you miss a dose, take it as soon as you can. If it is almost time for your next dose, take only that dose. Do not take double or extra doses. What may interact with this medicine? Interactions have not been studied. This list may not describe all possible interactions. Give your health care provider a list of all the medicines, herbs, non-prescription drugs, or dietary supplements you use. Also tell them if you smoke, drink alcohol, or use illegal drugs. Some items may interact with your medicine. What should I watch for while using this medicine? Your condition will be monitored carefully while you are receiving this medicine. You may need blood work done while you are taking this medicine. This medicine may cause a decrease in vitamin B6. You should make sure that you get enough vitamin B6 while you are taking this medicine. Discuss the foods you eat and the vitamins you take with your health care professional. What side effects may I notice from receiving this medicine? Side effects that you should report to your doctor or health care professional as soon as possible:  allergic reactions like skin rash, itching or hives, swelling of the face, lips, or tongue  seizures  signs and symptoms of a blood clot such as breathing problems; changes in vision; chest pain; severe, sudden headache; pain, swelling, warmth in the leg; trouble speaking; sudden numbness or  weakness of the face, arm or leg  signs and symptoms of a stroke like changes in vision; confusion; trouble speaking or understanding; severe headaches; sudden numbness or weakness of the face, arm or leg; trouble walking; dizziness; loss of balance or coordination Side effects that usually do not require medical attention (report to your doctor or health care professional if they continue or are  bothersome):  chills  cough  dizziness  fever  headaches  joint pain  muscle cramps  muscle pain  nausea, vomiting  pain, redness, or irritation at site where injected This list may not describe all possible side effects. Call your doctor for medical advice about side effects. You may report side effects to FDA at 1-800-FDA-1088. Where should I keep my medicine? Keep out of the reach of children. Store in a refrigerator between 2 and 8 degrees C (36 and 46 degrees F). Do not freeze or shake. Throw away any unused portion if using a single-dose vial. Multi-dose vials can be kept in the refrigerator for up to 21 days after the initial dose. Throw away unused medicine. NOTE: This sheet is a summary. It may not cover all possible information. If you have questions about this medicine, talk to your doctor, pharmacist, or health care provider.  2020 Elsevier/Gold Standard (2016-09-18 08:35:19) Cyanocobalamin, Vitamin B12 injection What is this medicine? CYANOCOBALAMIN (sye an oh koe BAL a min) is a man made form of vitamin B12. Vitamin B12 is used in the growth of healthy blood cells, nerve cells, and proteins in the body. It also helps with the metabolism of fats and carbohydrates. This medicine is used to treat people who can not absorb vitamin B12. This medicine may be used for other purposes; ask your health care provider or pharmacist if you have questions. COMMON BRAND NAME(S): B-12 Compliance Kit, B-12 Injection Kit, Cyomin, LA-12, Nutri-Twelve, Physicians EZ Use B-12, Primabalt What should I tell my health care provider before I take this medicine? They need to know if you have any of these conditions:  kidney disease  Leber's disease  megaloblastic anemia  an unusual or allergic reaction to cyanocobalamin, cobalt, other medicines, foods, dyes, or preservatives  pregnant or trying to get pregnant  breast-feeding How should I use this medicine? This medicine is  injected into a muscle or deeply under the skin. It is usually given by a health care professional in a clinic or doctor's office. However, your doctor may teach you how to inject yourself. Follow all instructions. Talk to your pediatrician regarding the use of this medicine in children. Special care may be needed. Overdosage: If you think you have taken too much of this medicine contact a poison control center or emergency room at once. NOTE: This medicine is only for you. Do not share this medicine with others. What if I miss a dose? If you are given your dose at a clinic or doctor's office, call to reschedule your appointment. If you give your own injections and you miss a dose, take it as soon as you can. If it is almost time for your next dose, take only that dose. Do not take double or extra doses. What may interact with this medicine?  colchicine  heavy alcohol intake This list may not describe all possible interactions. Give your health care provider a list of all the medicines, herbs, non-prescription drugs, or dietary supplements you use. Also tell them if you smoke, drink alcohol, or use illegal drugs. Some items may interact with your  medicine. What should I watch for while using this medicine? Visit your doctor or health care professional regularly. You may need blood work done while you are taking this medicine. You may need to follow a special diet. Talk to your doctor. Limit your alcohol intake and avoid smoking to get the best benefit. What side effects may I notice from receiving this medicine? Side effects that you should report to your doctor or health care professional as soon as possible:  allergic reactions like skin rash, itching or hives, swelling of the face, lips, or tongue  blue tint to skin  chest tightness, pain  difficulty breathing, wheezing  dizziness  red, swollen painful area on the leg Side effects that usually do not require medical attention (report  to your doctor or health care professional if they continue or are bothersome):  diarrhea  headache This list may not describe all possible side effects. Call your doctor for medical advice about side effects. You may report side effects to FDA at 1-800-FDA-1088. Where should I keep my medicine? Keep out of the reach of children. Store at room temperature between 15 and 30 degrees C (59 and 85 degrees F). Protect from light. Throw away any unused medicine after the expiration date. NOTE: This sheet is a summary. It may not cover all possible information. If you have questions about this medicine, talk to your doctor, pharmacist, or health care provider.  2020 Elsevier/Gold Standard (2007-05-23 22:10:20. Coronavirus (COVID-19) Are you at risk?  Are you at risk for the Coronavirus (COVID-19)?  To be considered HIGH RISK for Coronavirus (COVID-19), you have to meet the following criteria:  . Traveled to [place], [place], [place], [place] or [place]; or in the [place] to [place], [place], [place], or [place]; and have fever, cough, and shortness of breath within the last 2 weeks of travel OR . Been in close contact with a person diagnosed with COVID-19 within the last 2 weeks and have fever, cough, and shortness of breath . IF YOU DO NOT MEET THESE CRITERIA, YOU ARE CONSIDERED LOW RISK FOR COVID-19.  What to do if you are HIGH RISK for COVID-19?  Marland Kitchen If you are having a medical emergency, call 911. . Seek medical care right away. Before you go to a doctor's office, urgent care or emergency department, call ahead and tell them about your recent travel, contact with someone diagnosed with COVID-19, and your symptoms. You should receive instructions from your physician's office regarding next steps of care.  . When you arrive at healthcare provider, tell the healthcare staff immediately you have returned from visiting [place], [place], [place], [place] or [place]; or traveled in the [place] to [place], [place], [place], or [place]; in the last two weeks or you have been in close contact with a person diagnosed with COVID-19 in the last 2 weeks.   . Tell the health care staff about your symptoms: fever, cough and shortness of breath. . After you have been seen by a medical provider, you will be either: o Tested for (COVID-19) and discharged home on quarantine except to seek medical care if symptoms worsen, and asked to  - Stay home and avoid contact with others until you get your results (4-5 days)  - Avoid travel on public transportation if possible (such as bus, train, or airplane) or o Sent to the Emergency Department by EMS for evaluation, COVID-19 testing, and possible admission depending on your condition and test  results.  What to do if you are LOW RISK for COVID-19?  Reduce your risk of any infection by using the same precautions used for avoiding the common cold or flu:  Marland Kitchen Wash your hands often with soap and warm water for at least 20 seconds.  If soap and water are not readily available, use an alcohol-based hand sanitizer with at least 60% alcohol.  . If coughing or sneezing, cover your mouth and nose by coughing or sneezing into the elbow areas of your shirt or coat, into a tissue or into your sleeve (not your hands). . Avoid shaking hands with others and consider head nods or verbal greetings only. . Avoid touching your eyes, nose, or mouth with unwashed hands.  . Avoid close contact with people who are sick. . Avoid places or events with large numbers of people in one location, like concerts or sporting events. . Carefully consider travel plans you have or are making. . If you are planning any travel outside or inside the [place], visit the CDC's Travelers' Health webpage for the latest health notices. . If you have some symptoms but not all symptoms, continue to monitor at home and seek medical attention if your symptoms worsen. . If you are having a  medical emergency, call 911.   [facility] / e-Visit: [email or web address]         [facility]: [facility]: [phone]                   [facility]: [phone]

## 2018-11-16 NOTE — Assessment & Plan Note (Signed)
Lab review 04/25/2018: Hemoglobin 9.8, MCV 89, platelets 129 08/09/2018: Hemoglobin 9.8, MCV 90.4, platelets 142 11/16/2018:  Current treatment: B12 injections monthly, Retacrit injections monthly started 11/08/2017, 40,000 units monthly Goal hemoglobin: Tokeep hemoglobinabove10 gandwithout symptoms  Return to clinic monthly for injections and every 3 months for follow-up with me.

## 2018-11-16 NOTE — Progress Notes (Signed)
Per Dr. Lindi Adie, ok to give Retacrit with BP 202/82.Pt. denies chest pain, dizziness, and no shortness of breath noted.

## 2018-11-17 ENCOUNTER — Telehealth: Payer: Self-pay | Admitting: Hematology and Oncology

## 2018-11-17 NOTE — Telephone Encounter (Signed)
I left a message regarding schedule  

## 2018-11-21 ENCOUNTER — Telehealth: Payer: Self-pay | Admitting: *Deleted

## 2018-11-21 ENCOUNTER — Telehealth: Payer: Self-pay

## 2018-11-21 NOTE — Telephone Encounter (Signed)
RN with spoke [employer]/[facility] conservator at [employer].  Dr. Posey Pronto requesting infusions for feraheme due to low iron lab results.  Pt and husband inquiring to see if this can be done at upcoming injection appointment on 9/30 vs going through short stay at hospital.   RN explained we can accommodate iron infusions at clinic, however we will need to obtain authorization from insurance prior to appointment and this can take 5-7 business days.  Amber will contact patient to update.  If patient decides to proceed here, Luetta Nutting will fax labs and request to clinic.

## 2018-11-21 NOTE — Telephone Encounter (Signed)
Call received from [place], [place] [phone]).  "Do you all give Feraheme.  What is the fax number to send orders for [name] who receives injections there." Call transferred to [facility] nurse for Dr. Lindi Adie for best help with this matter.

## 2018-11-23 ENCOUNTER — Other Ambulatory Visit: Payer: Self-pay

## 2018-11-23 ENCOUNTER — Encounter (HOSPITAL_COMMUNITY): Payer: Medicare HMO

## 2018-11-23 ENCOUNTER — Inpatient Hospital Stay: Payer: Medicare HMO

## 2018-11-23 VITALS — BP 162/92 | HR 68 | Temp 98.7°F | Resp 18

## 2018-11-23 DIAGNOSIS — R5383 Other fatigue: Secondary | ICD-10-CM | POA: Diagnosis not present

## 2018-11-23 DIAGNOSIS — D631 Anemia in chronic kidney disease: Secondary | ICD-10-CM | POA: Diagnosis not present

## 2018-11-23 DIAGNOSIS — R231 Pallor: Secondary | ICD-10-CM | POA: Diagnosis not present

## 2018-11-23 DIAGNOSIS — Z794 Long term (current) use of insulin: Secondary | ICD-10-CM | POA: Diagnosis not present

## 2018-11-23 DIAGNOSIS — N189 Chronic kidney disease, unspecified: Secondary | ICD-10-CM | POA: Diagnosis not present

## 2018-11-23 DIAGNOSIS — E119 Type 2 diabetes mellitus without complications: Secondary | ICD-10-CM | POA: Diagnosis not present

## 2018-11-23 DIAGNOSIS — E538 Deficiency of other specified B group vitamins: Secondary | ICD-10-CM

## 2018-11-23 DIAGNOSIS — D649 Anemia, unspecified: Secondary | ICD-10-CM

## 2018-11-23 DIAGNOSIS — I129 Hypertensive chronic kidney disease with stage 1 through stage 4 chronic kidney disease, or unspecified chronic kidney disease: Secondary | ICD-10-CM | POA: Diagnosis not present

## 2018-11-23 DIAGNOSIS — R531 Weakness: Secondary | ICD-10-CM | POA: Diagnosis not present

## 2018-11-23 DIAGNOSIS — Z862 Personal history of diseases of the blood and blood-forming organs and certain disorders involving the immune mechanism: Secondary | ICD-10-CM

## 2018-11-23 LAB — CBC WITH DIFFERENTIAL (CANCER CENTER ONLY)
Abs Immature Granulocytes: 0.02 10*3/uL (ref 0.00–0.07)
Basophils Absolute: 0 10*3/uL (ref 0.0–0.1)
Basophils Relative: 0 %
Eosinophils Absolute: 0.2 10*3/uL (ref 0.0–0.5)
Eosinophils Relative: 4 %
HCT: 25.9 % — ABNORMAL LOW (ref 36.0–46.0)
Hemoglobin: 7.7 g/dL — ABNORMAL LOW (ref 12.0–15.0)
Immature Granulocytes: 0 %
Lymphocytes Relative: 32 %
Lymphs Abs: 1.9 10*3/uL (ref 0.7–4.0)
MCH: 28.7 pg (ref 26.0–34.0)
MCHC: 29.7 g/dL — ABNORMAL LOW (ref 30.0–36.0)
MCV: 96.6 fL (ref 80.0–100.0)
Monocytes Absolute: 0.4 10*3/uL (ref 0.1–1.0)
Monocytes Relative: 8 %
Neutro Abs: 3.3 10*3/uL (ref 1.7–7.7)
Neutrophils Relative %: 56 %
Platelet Count: 166 10*3/uL (ref 150–400)
RBC: 2.68 MIL/uL — ABNORMAL LOW (ref 3.87–5.11)
RDW: 16 % — ABNORMAL HIGH (ref 11.5–15.5)
WBC Count: 5.8 10*3/uL (ref 4.0–10.5)
nRBC: 0 % (ref 0.0–0.2)

## 2018-11-23 MED ORDER — EPOETIN ALFA-EPBX 40000 UNIT/ML IJ SOLN
40000.0000 [IU] | Freq: Once | INTRAMUSCULAR | Status: AC
  Administered 2018-11-23: 40000 [IU] via SUBCUTANEOUS
  Filled 2018-11-23: qty 1

## 2018-11-25 ENCOUNTER — Telehealth: Payer: Self-pay

## 2018-11-25 NOTE — Telephone Encounter (Signed)
RN left voicemail for return call to follow up with iron request from Dr. Serita Grit office.

## 2018-11-28 ENCOUNTER — Telehealth: Payer: Self-pay | Admitting: *Deleted

## 2018-11-28 ENCOUNTER — Other Ambulatory Visit: Payer: Self-pay | Admitting: Hematology and Oncology

## 2018-11-28 DIAGNOSIS — I129 Hypertensive chronic kidney disease with stage 1 through stage 4 chronic kidney disease, or unspecified chronic kidney disease: Secondary | ICD-10-CM | POA: Diagnosis not present

## 2018-11-28 NOTE — Telephone Encounter (Signed)
Bailey [name] returned call regarding iron infusion. They are OK coming to [facility] for the infusions.  Dr Lindi Adie states Venofer  once a week for 4 weeks. Message to scheduler

## 2018-11-29 DIAGNOSIS — I10 Essential (primary) hypertension: Secondary | ICD-10-CM | POA: Diagnosis not present

## 2018-11-29 DIAGNOSIS — F411 Generalized anxiety disorder: Secondary | ICD-10-CM | POA: Diagnosis not present

## 2018-11-29 DIAGNOSIS — N289 Disorder of kidney and ureter, unspecified: Secondary | ICD-10-CM | POA: Diagnosis not present

## 2018-11-30 ENCOUNTER — Inpatient Hospital Stay: Payer: Medicare HMO

## 2018-11-30 ENCOUNTER — Encounter (HOSPITAL_COMMUNITY): Payer: Self-pay | Admitting: Nephrology

## 2018-11-30 ENCOUNTER — Inpatient Hospital Stay: Payer: Medicare HMO | Attending: Hematology and Oncology

## 2018-11-30 ENCOUNTER — Other Ambulatory Visit: Payer: Self-pay

## 2018-11-30 VITALS — BP 182/83 | HR 66 | Temp 97.5°F | Resp 18

## 2018-11-30 DIAGNOSIS — D631 Anemia in chronic kidney disease: Secondary | ICD-10-CM | POA: Insufficient documentation

## 2018-11-30 DIAGNOSIS — D649 Anemia, unspecified: Secondary | ICD-10-CM

## 2018-11-30 DIAGNOSIS — E538 Deficiency of other specified B group vitamins: Secondary | ICD-10-CM | POA: Insufficient documentation

## 2018-11-30 DIAGNOSIS — N189 Chronic kidney disease, unspecified: Secondary | ICD-10-CM | POA: Insufficient documentation

## 2018-11-30 DIAGNOSIS — Z862 Personal history of diseases of the blood and blood-forming organs and certain disorders involving the immune mechanism: Secondary | ICD-10-CM

## 2018-11-30 LAB — CBC WITH DIFFERENTIAL (CANCER CENTER ONLY)
Abs Immature Granulocytes: 0.01 10*3/uL (ref 0.00–0.07)
Basophils Absolute: 0 10*3/uL (ref 0.0–0.1)
Basophils Relative: 1 %
Eosinophils Absolute: 0.2 10*3/uL (ref 0.0–0.5)
Eosinophils Relative: 3 %
HCT: 29 % — ABNORMAL LOW (ref 36.0–46.0)
Hemoglobin: 8.8 g/dL — ABNORMAL LOW (ref 12.0–15.0)
Immature Granulocytes: 0 %
Lymphocytes Relative: 33 %
Lymphs Abs: 1.9 10*3/uL (ref 0.7–4.0)
MCH: 28.4 pg (ref 26.0–34.0)
MCHC: 30.3 g/dL (ref 30.0–36.0)
MCV: 93.5 fL (ref 80.0–100.0)
Monocytes Absolute: 0.4 10*3/uL (ref 0.1–1.0)
Monocytes Relative: 6 %
Neutro Abs: 3.3 10*3/uL (ref 1.7–7.7)
Neutrophils Relative %: 57 %
Platelet Count: 143 10*3/uL — ABNORMAL LOW (ref 150–400)
RBC: 3.1 MIL/uL — ABNORMAL LOW (ref 3.87–5.11)
RDW: 16.5 % — ABNORMAL HIGH (ref 11.5–15.5)
WBC Count: 5.8 10*3/uL (ref 4.0–10.5)
nRBC: 0 % (ref 0.0–0.2)

## 2018-11-30 MED ORDER — ACETAMINOPHEN 325 MG PO TABS
ORAL_TABLET | ORAL | Status: AC
  Filled 2018-11-30: qty 2

## 2018-11-30 MED ORDER — SODIUM CHLORIDE 0.9 % IV SOLN
200.0000 mg | Freq: Once | INTRAVENOUS | Status: AC
  Administered 2018-11-30: 200 mg via INTRAVENOUS
  Filled 2018-11-30: qty 10

## 2018-11-30 MED ORDER — EPOETIN ALFA 40000 UNIT/ML IJ SOLN
INTRAMUSCULAR | Status: AC
  Filled 2018-11-30: qty 1

## 2018-11-30 MED ORDER — EPOETIN ALFA-EPBX 40000 UNIT/ML IJ SOLN: 40000.0000 [IU] | Freq: Once | INTRAMUSCULAR | Status: DC

## 2018-11-30 MED ORDER — SODIUM CHLORIDE 0.9 % IV SOLN
Freq: Once | INTRAVENOUS | Status: AC
  Administered 2018-11-30: 14:00:00 via INTRAVENOUS
  Filled 2018-11-30: qty 250

## 2018-11-30 MED ORDER — ACETAMINOPHEN 325 MG PO TABS
650.0000 mg | ORAL_TABLET | Freq: Once | ORAL | Status: AC
  Administered 2018-11-30: 650 mg via ORAL

## 2018-11-30 NOTE — Progress Notes (Signed)
Per pharmacy pt can not receive IV venofer and retacrit injection on the same day.  Per Dr. Lindi Adie, Retacrit injection to be held until pt is finished receiving IV venofer and then injection apts will resume. Pt and spouse notified.

## 2018-11-30 NOTE — Patient Instructions (Signed)
Iron Sucrose injection What is this medicine? IRON SUCROSE (AHY ern SOO krohs) is an iron complex. Iron is used to make healthy red blood cells, which carry oxygen and nutrients throughout the body. This medicine is used to treat iron deficiency anemia in people with chronic kidney disease. This medicine may be used for other purposes; ask your health care provider or pharmacist if you have questions. COMMON BRAND NAME(S): Venofer What should I tell my health care provider before I take this medicine? They need to know if you have any of these conditions:  anemia not caused by low iron levels  heart disease  high levels of iron in the blood  kidney disease  liver disease  an unusual or allergic reaction to iron, other medicines, foods, dyes, or preservatives  pregnant or trying to get pregnant  breast-feeding How should I use this medicine? This medicine is for infusion into a vein. It is given by a health care professional in a hospital or clinic setting. Talk to your pediatrician regarding the use of this medicine in children. While this drug may be prescribed for children as young as 2 years for selected conditions, precautions do apply. Overdosage: If you think you have taken too much of this medicine contact a poison control center or emergency room at once. NOTE: This medicine is only for you. Do not share this medicine with others. What if I miss a dose? It is important not to miss your dose. Call your doctor or health care professional if you are unable to keep an appointment. What may interact with this medicine? Do not take this medicine with any of the following medications:  deferoxamine  dimercaprol  other iron products This medicine may also interact with the following medications:  chloramphenicol  deferasirox This list may not describe all possible interactions. Give your health care provider a list of all the medicines, herbs, non-prescription drugs, or  dietary supplements you use. Also tell them if you smoke, drink alcohol, or use illegal drugs. Some items may interact with your medicine. What should I watch for while using this medicine? Visit your doctor or healthcare professional regularly. Tell your doctor or healthcare professional if your symptoms do not start to get better or if they get worse. You may need blood work done while you are taking this medicine. You may need to follow a special diet. Talk to your doctor. Foods that contain iron include: whole grains/cereals, dried fruits, beans, or peas, leafy green vegetables, and organ meats (liver, kidney). What side effects may I notice from receiving this medicine? Side effects that you should report to your doctor or health care professional as soon as possible:  allergic reactions like skin rash, itching or hives, swelling of the face, lips, or tongue  breathing problems  changes in blood pressure  cough  fast, irregular heartbeat  feeling faint or lightheaded, falls  fever or chills  flushing, sweating, or hot feelings  joint or muscle aches/pains  seizures  swelling of the ankles or feet  unusually weak or tired Side effects that usually do not require medical attention (report to your doctor or health care professional if they continue or are bothersome):  diarrhea  feeling achy  headache  irritation at site where injected  nausea, vomiting  stomach upset  tiredness This list may not describe all possible side effects. Call your doctor for medical advice about side effects. You may report side effects to FDA at 1-800-FDA-1088. Where should I keep   my medicine? This drug is given in a hospital or clinic and will not be stored at home. NOTE: This sheet is a summary. It may not cover all possible information. If you have questions about this medicine, talk to your doctor, pharmacist, or health care provider.  2020 Elsevier/Gold Standard (2010-11-20  17:14:35) Coronavirus (COVID-19) Are you at risk?  Are you at risk for the Coronavirus (COVID-19)?  To be considered HIGH RISK for Coronavirus (COVID-19), you have to meet the following criteria:  . Traveled to [place], [place], [place], [place] or [place]; or in the [place] to [place], [place], [place], or [place]; and have fever, cough, and shortness of breath within the last 2 weeks of travel OR . Been in close contact with a person diagnosed with COVID-19 within the last 2 weeks and have fever, cough, and shortness of breath . IF YOU DO NOT MEET THESE CRITERIA, YOU ARE CONSIDERED LOW RISK FOR COVID-19.  What to do if you are HIGH RISK for COVID-19?  . If you are having a medical emergency, call 911. . Seek medical care right away. Before you go to a doctor's office, urgent care or emergency department, call ahead and tell them about your recent travel, contact with someone diagnosed with COVID-19, and your symptoms. You should receive instructions from your physician's office regarding next steps of care.  . When you arrive at healthcare provider, tell the healthcare staff immediately you have returned from visiting [place], [place], [place], [place] or [place]; or traveled in the [place] to [place], [place], [place], or [place]; in the last two weeks or you have been in close contact with a person diagnosed with COVID-19 in the last 2 weeks.   . Tell the health care staff about your symptoms: fever, cough and shortness of breath. . After you have been seen by a medical provider, you will be either: o Tested for (COVID-19) and discharged home on quarantine except to seek medical care if symptoms worsen, and asked to  - Stay home and avoid contact with others until you get your results (4-5 days)  - Avoid travel on public transportation if possible (such as bus, train, or airplane) or o Sent to the Emergency Department by EMS for evaluation, COVID-19 testing, and  possible admission depending on your condition and test results.  What to do if you are LOW RISK for COVID-19?  Reduce your risk of any infection by using the same precautions used for avoiding the common cold or flu:  . Wash your hands often with soap and warm water for at least 20 seconds.  If soap and water are not readily available, use an alcohol-based hand sanitizer with at least 60% alcohol.  . If coughing or sneezing, cover your mouth and nose by coughing or sneezing into the elbow areas of your shirt or coat, into a tissue or into your sleeve (not your hands). . Avoid shaking hands with others and consider head nods or verbal greetings only. . Avoid touching your eyes, nose, or mouth with unwashed hands.  . Avoid close contact with people who are sick. . Avoid places or events with large numbers of people in one location, like concerts or sporting events. . Carefully consider travel plans you have or are making. . If you are planning any travel outside or inside the [place], visit the CDC's Travelers' Health webpage for the latest health notices. . If you have some symptoms but not all   symptoms, continue to monitor at home and seek medical attention if your symptoms worsen. . If you are having a medical emergency, call 911.   ADDITIONAL HEALTHCARE OPTIONS FOR PATIENTS  [facility] / e-Visit: [email or web address]         [facility]: [phone]  [facility]: [phone]                   [facility]: [phone]  

## 2018-12-05 ENCOUNTER — Telehealth: Payer: Self-pay | Admitting: Hematology and Oncology

## 2018-12-05 NOTE — Telephone Encounter (Signed)
Scheduled appt per 10/7 sch message - pt husband is aware of appt date

## 2018-12-06 DIAGNOSIS — F411 Generalized anxiety disorder: Secondary | ICD-10-CM | POA: Diagnosis not present

## 2018-12-06 DIAGNOSIS — N390 Urinary tract infection, site not specified: Secondary | ICD-10-CM | POA: Diagnosis not present

## 2018-12-06 DIAGNOSIS — I10 Essential (primary) hypertension: Secondary | ICD-10-CM | POA: Diagnosis not present

## 2018-12-06 DIAGNOSIS — R42 Dizziness and giddiness: Secondary | ICD-10-CM | POA: Diagnosis not present

## 2018-12-07 ENCOUNTER — Inpatient Hospital Stay: Payer: Medicare HMO

## 2018-12-07 ENCOUNTER — Ambulatory Visit: Payer: Medicare HMO

## 2018-12-07 ENCOUNTER — Other Ambulatory Visit: Payer: Medicare HMO

## 2018-12-07 ENCOUNTER — Other Ambulatory Visit: Payer: Self-pay

## 2018-12-07 VITALS — BP 189/74 | HR 65 | Temp 98.7°F | Resp 20

## 2018-12-07 DIAGNOSIS — N189 Chronic kidney disease, unspecified: Secondary | ICD-10-CM | POA: Diagnosis not present

## 2018-12-07 DIAGNOSIS — D649 Anemia, unspecified: Secondary | ICD-10-CM

## 2018-12-07 DIAGNOSIS — E538 Deficiency of other specified B group vitamins: Secondary | ICD-10-CM | POA: Diagnosis not present

## 2018-12-07 DIAGNOSIS — Z862 Personal history of diseases of the blood and blood-forming organs and certain disorders involving the immune mechanism: Secondary | ICD-10-CM

## 2018-12-07 DIAGNOSIS — D631 Anemia in chronic kidney disease: Secondary | ICD-10-CM | POA: Diagnosis not present

## 2018-12-07 LAB — CBC WITH DIFFERENTIAL (CANCER CENTER ONLY)
Abs Immature Granulocytes: 0.02 10*3/uL (ref 0.00–0.07)
Basophils Absolute: 0.1 10*3/uL (ref 0.0–0.1)
Basophils Relative: 1 %
Eosinophils Absolute: 0.2 10*3/uL (ref 0.0–0.5)
Eosinophils Relative: 3 %
HCT: 28.7 % — ABNORMAL LOW (ref 36.0–46.0)
Hemoglobin: 8.9 g/dL — ABNORMAL LOW (ref 12.0–15.0)
Immature Granulocytes: 0 %
Lymphocytes Relative: 37 %
Lymphs Abs: 2.1 10*3/uL (ref 0.7–4.0)
MCH: 29.3 pg (ref 26.0–34.0)
MCHC: 31 g/dL (ref 30.0–36.0)
MCV: 94.4 fL (ref 80.0–100.0)
Monocytes Absolute: 0.4 10*3/uL (ref 0.1–1.0)
Monocytes Relative: 7 %
Neutro Abs: 2.8 10*3/uL (ref 1.7–7.7)
Neutrophils Relative %: 52 %
Platelet Count: 125 10*3/uL — ABNORMAL LOW (ref 150–400)
RBC: 3.04 MIL/uL — ABNORMAL LOW (ref 3.87–5.11)
RDW: 15.5 % (ref 11.5–15.5)
WBC Count: 5.5 10*3/uL (ref 4.0–10.5)
nRBC: 0 % (ref 0.0–0.2)

## 2018-12-07 MED ORDER — SODIUM CHLORIDE 0.9 % IV SOLN
Freq: Once | INTRAVENOUS | Status: AC
  Administered 2018-12-07: 14:00:00 via INTRAVENOUS
  Filled 2018-12-07: qty 250

## 2018-12-07 MED ORDER — SODIUM CHLORIDE 0.9 % IV SOLN
200.0000 mg | Freq: Once | INTRAVENOUS | Status: AC
  Administered 2018-12-07: 15:00:00 200 mg via INTRAVENOUS
  Filled 2018-12-07: qty 10

## 2018-12-07 MED ORDER — ACETAMINOPHEN 325 MG PO TABS
ORAL_TABLET | ORAL | Status: AC
  Filled 2018-12-07: qty 2

## 2018-12-07 MED ORDER — ACETAMINOPHEN 325 MG PO TABS
650.0000 mg | ORAL_TABLET | Freq: Once | ORAL | Status: AC
  Administered 2018-12-07: 14:00:00 650 mg via ORAL

## 2018-12-07 NOTE — Patient Instructions (Signed)
Iron Sucrose injection What is this medicine? IRON SUCROSE (AHY ern SOO krohs) is an iron complex. Iron is used to make healthy red blood cells, which carry oxygen and nutrients throughout the body. This medicine is used to treat iron deficiency anemia in people with chronic kidney disease. This medicine may be used for other purposes; ask your health care provider or pharmacist if you have questions. COMMON BRAND NAME(S): Venofer What should I tell my health care provider before I take this medicine? They need to know if you have any of these conditions:  anemia not caused by low iron levels  heart disease  high levels of iron in the blood  kidney disease  liver disease  an unusual or allergic reaction to iron, other medicines, foods, dyes, or preservatives  pregnant or trying to get pregnant  breast-feeding How should I use this medicine? This medicine is for infusion into a vein. It is given by a health care professional in a hospital or clinic setting. Talk to your pediatrician regarding the use of this medicine in children. While this drug may be prescribed for children as young as 2 years for selected conditions, precautions do apply. Overdosage: If you think you have taken too much of this medicine contact a poison control center or emergency room at once. NOTE: This medicine is only for you. Do not share this medicine with others. What if I miss a dose? It is important not to miss your dose. Call your doctor or health care professional if you are unable to keep an appointment. What may interact with this medicine? Do not take this medicine with any of the following medications:  deferoxamine  dimercaprol  other iron products This medicine may also interact with the following medications:  chloramphenicol  deferasirox This list may not describe all possible interactions. Give your health care provider a list of all the medicines, herbs, non-prescription drugs, or  dietary supplements you use. Also tell them if you smoke, drink alcohol, or use illegal drugs. Some items may interact with your medicine. What should I watch for while using this medicine? Visit your doctor or healthcare professional regularly. Tell your doctor or healthcare professional if your symptoms do not start to get better or if they get worse. You may need blood work done while you are taking this medicine. You may need to follow a special diet. Talk to your doctor. Foods that contain iron include: whole grains/cereals, dried fruits, beans, or peas, leafy green vegetables, and organ meats (liver, kidney). What side effects may I notice from receiving this medicine? Side effects that you should report to your doctor or health care professional as soon as possible:  allergic reactions like skin rash, itching or hives, swelling of the face, lips, or tongue  breathing problems  changes in blood pressure  cough  fast, irregular heartbeat  feeling faint or lightheaded, falls  fever or chills  flushing, sweating, or hot feelings  joint or muscle aches/pains  seizures  swelling of the ankles or feet  unusually weak or tired Side effects that usually do not require medical attention (report to your doctor or health care professional if they continue or are bothersome):  diarrhea  feeling achy  headache  irritation at site where injected  nausea, vomiting  stomach upset  tiredness This list may not describe all possible side effects. Call your doctor for medical advice about side effects. You may report side effects to FDA at 1-800-FDA-1088. Where should I keep   my medicine? This drug is given in a hospital or clinic and will not be stored at home. NOTE: This sheet is a summary. It may not cover all possible information. If you have questions about this medicine, talk to your doctor, pharmacist, or health care provider.  2020 Elsevier/Gold Standard (2010-11-20  17:14:35) Coronavirus (COVID-19) Are you at risk?  Are you at risk for the Coronavirus (COVID-19)?  To be considered HIGH RISK for Coronavirus (COVID-19), you have to meet the following criteria:  . Traveled to [place], [place], [place], [place] or [place]; or in the [place] to [place], [place], [place], or [place]; and have fever, cough, and shortness of breath within the last 2 weeks of travel OR . Been in close contact with a person diagnosed with COVID-19 within the last 2 weeks and have fever, cough, and shortness of breath . IF YOU DO NOT MEET THESE CRITERIA, YOU ARE CONSIDERED LOW RISK FOR COVID-19.  What to do if you are HIGH RISK for COVID-19?  . If you are having a medical emergency, call 911. . Seek medical care right away. Before you go to a doctor's office, urgent care or emergency department, call ahead and tell them about your recent travel, contact with someone diagnosed with COVID-19, and your symptoms. You should receive instructions from your physician's office regarding next steps of care.  . When you arrive at healthcare provider, tell the healthcare staff immediately you have returned from visiting [place], [place], [place], [place] or [place]; or traveled in the [place] to [place], [place], [place], or [place]; in the last two weeks or you have been in close contact with a person diagnosed with COVID-19 in the last 2 weeks.   . Tell the health care staff about your symptoms: fever, cough and shortness of breath. . After you have been seen by a medical provider, you will be either: o Tested for (COVID-19) and discharged home on quarantine except to seek medical care if symptoms worsen, and asked to  - Stay home and avoid contact with others until you get your results (4-5 days)  - Avoid travel on public transportation if possible (such as bus, train, or airplane) or o Sent to the Emergency Department by EMS for evaluation, COVID-19 testing, and  possible admission depending on your condition and test results.  What to do if you are LOW RISK for COVID-19?  Reduce your risk of any infection by using the same precautions used for avoiding the common cold or flu:  . Wash your hands often with soap and warm water for at least 20 seconds.  If soap and water are not readily available, use an alcohol-based hand sanitizer with at least 60% alcohol.  . If coughing or sneezing, cover your mouth and nose by coughing or sneezing into the elbow areas of your shirt or coat, into a tissue or into your sleeve (not your hands). . Avoid shaking hands with others and consider head nods or verbal greetings only. . Avoid touching your eyes, nose, or mouth with unwashed hands.  . Avoid close contact with people who are sick. . Avoid places or events with large numbers of people in one location, like concerts or sporting events. . Carefully consider travel plans you have or are making. . If you are planning any travel outside or inside the [place], visit the CDC's Travelers' Health webpage for the latest health notices. . If you have some symptoms but not all   symptoms, continue to monitor at home and seek medical attention if your symptoms worsen. . If you are having a medical emergency, call 911.   ADDITIONAL HEALTHCARE OPTIONS FOR PATIENTS   Telehealth / e-Visit: [email or web address]         [facility]: [phone]  [facility]: [phone]                   [facility]: [phone]  

## 2018-12-07 NOTE — Progress Notes (Signed)
Per Dr. Lindi Adie patient is OK to discharge with BP of 189/74, with instructions to call her PCP in the morning. The patient verbalized understanding. She reports she is feeling fine.

## 2018-12-12 DIAGNOSIS — F410 Panic disorder [episodic paroxysmal anxiety] without agoraphobia: Secondary | ICD-10-CM | POA: Diagnosis not present

## 2018-12-12 DIAGNOSIS — E1142 Type 2 diabetes mellitus with diabetic polyneuropathy: Secondary | ICD-10-CM | POA: Diagnosis not present

## 2018-12-12 DIAGNOSIS — I10 Essential (primary) hypertension: Secondary | ICD-10-CM | POA: Diagnosis not present

## 2018-12-12 DIAGNOSIS — N184 Chronic kidney disease, stage 4 (severe): Secondary | ICD-10-CM | POA: Diagnosis not present

## 2018-12-14 ENCOUNTER — Ambulatory Visit: Payer: Medicare HMO

## 2018-12-14 ENCOUNTER — Ambulatory Visit: Payer: Medicare HMO | Admitting: Hematology and Oncology

## 2018-12-14 ENCOUNTER — Other Ambulatory Visit: Payer: Medicare HMO

## 2018-12-15 ENCOUNTER — Other Ambulatory Visit: Payer: Self-pay

## 2018-12-15 ENCOUNTER — Inpatient Hospital Stay: Payer: Medicare HMO

## 2018-12-15 VITALS — BP 186/66 | HR 62 | Temp 97.9°F | Resp 20

## 2018-12-15 DIAGNOSIS — E538 Deficiency of other specified B group vitamins: Secondary | ICD-10-CM

## 2018-12-15 DIAGNOSIS — D649 Anemia, unspecified: Secondary | ICD-10-CM

## 2018-12-15 DIAGNOSIS — N189 Chronic kidney disease, unspecified: Secondary | ICD-10-CM | POA: Diagnosis not present

## 2018-12-15 DIAGNOSIS — D631 Anemia in chronic kidney disease: Secondary | ICD-10-CM | POA: Diagnosis not present

## 2018-12-15 DIAGNOSIS — Z862 Personal history of diseases of the blood and blood-forming organs and certain disorders involving the immune mechanism: Secondary | ICD-10-CM

## 2018-12-15 LAB — CBC WITH DIFFERENTIAL (CANCER CENTER ONLY)
Abs Immature Granulocytes: 0.01 10*3/uL (ref 0.00–0.07)
Basophils Absolute: 0 10*3/uL (ref 0.0–0.1)
Basophils Relative: 1 %
Eosinophils Absolute: 0.3 10*3/uL (ref 0.0–0.5)
Eosinophils Relative: 7 %
HCT: 28 % — ABNORMAL LOW (ref 36.0–46.0)
Hemoglobin: 8.6 g/dL — ABNORMAL LOW (ref 12.0–15.0)
Immature Granulocytes: 0 %
Lymphocytes Relative: 37 %
Lymphs Abs: 1.7 10*3/uL (ref 0.7–4.0)
MCH: 28.8 pg (ref 26.0–34.0)
MCHC: 30.7 g/dL (ref 30.0–36.0)
MCV: 93.6 fL (ref 80.0–100.0)
Monocytes Absolute: 0.3 10*3/uL (ref 0.1–1.0)
Monocytes Relative: 6 %
Neutro Abs: 2.2 10*3/uL (ref 1.7–7.7)
Neutrophils Relative %: 49 %
Platelet Count: 115 10*3/uL — ABNORMAL LOW (ref 150–400)
RBC: 2.99 MIL/uL — ABNORMAL LOW (ref 3.87–5.11)
RDW: 14.6 % (ref 11.5–15.5)
WBC Count: 4.5 10*3/uL (ref 4.0–10.5)
nRBC: 0 % (ref 0.0–0.2)

## 2018-12-15 MED ORDER — SODIUM CHLORIDE 0.9 % IV SOLN
Freq: Once | INTRAVENOUS | Status: AC
  Administered 2018-12-15: 15:00:00 via INTRAVENOUS
  Filled 2018-12-15: qty 250

## 2018-12-15 MED ORDER — SODIUM CHLORIDE 0.9 % IV SOLN
200.0000 mg | Freq: Once | INTRAVENOUS | Status: AC
  Administered 2018-12-15: 200 mg via INTRAVENOUS
  Filled 2018-12-15: qty 10

## 2018-12-15 MED ORDER — ACETAMINOPHEN 325 MG PO TABS
650.0000 mg | ORAL_TABLET | Freq: Once | ORAL | Status: AC
  Administered 2018-12-15: 650 mg via ORAL

## 2018-12-15 MED ORDER — ACETAMINOPHEN 325 MG PO TABS
ORAL_TABLET | ORAL | Status: AC
  Filled 2018-12-15: qty 2

## 2018-12-15 NOTE — Patient Instructions (Signed)

## 2018-12-21 ENCOUNTER — Other Ambulatory Visit: Payer: Self-pay

## 2018-12-21 ENCOUNTER — Inpatient Hospital Stay: Payer: Medicare HMO

## 2018-12-21 VITALS — BP 168/74 | HR 60 | Temp 97.7°F | Resp 16

## 2018-12-21 DIAGNOSIS — E538 Deficiency of other specified B group vitamins: Secondary | ICD-10-CM | POA: Diagnosis not present

## 2018-12-21 DIAGNOSIS — N189 Chronic kidney disease, unspecified: Secondary | ICD-10-CM | POA: Diagnosis not present

## 2018-12-21 DIAGNOSIS — Z862 Personal history of diseases of the blood and blood-forming organs and certain disorders involving the immune mechanism: Secondary | ICD-10-CM

## 2018-12-21 DIAGNOSIS — D631 Anemia in chronic kidney disease: Secondary | ICD-10-CM | POA: Diagnosis not present

## 2018-12-21 MED ORDER — ACETAMINOPHEN 325 MG PO TABS
650.0000 mg | ORAL_TABLET | Freq: Once | ORAL | Status: AC
  Administered 2018-12-21: 650 mg via ORAL

## 2018-12-21 MED ORDER — SODIUM CHLORIDE 0.9 % IV SOLN
200.0000 mg | Freq: Once | INTRAVENOUS | Status: AC
  Administered 2018-12-21: 15:00:00 200 mg via INTRAVENOUS
  Filled 2018-12-21: qty 10

## 2018-12-21 MED ORDER — SODIUM CHLORIDE 0.9 % IV SOLN
Freq: Once | INTRAVENOUS | Status: AC
  Administered 2018-12-21: 15:00:00 via INTRAVENOUS
  Filled 2018-12-21: qty 250

## 2018-12-21 MED ORDER — ACETAMINOPHEN 325 MG PO TABS
ORAL_TABLET | ORAL | Status: AC
  Filled 2018-12-21: qty 2

## 2018-12-21 NOTE — Patient Instructions (Signed)
Iron Sucrose injection What is this medicine? IRON SUCROSE (AHY ern SOO krohs) is an iron complex. Iron is used to make healthy red blood cells, which carry oxygen and nutrients throughout the body. This medicine is used to treat iron deficiency anemia in people with chronic kidney disease. This medicine may be used for other purposes; ask your health care provider or pharmacist if you have questions. COMMON BRAND NAME(S): Venofer What should I tell my health care provider before I take this medicine? They need to know if you have any of these conditions:  anemia not caused by low iron levels  heart disease  high levels of iron in the blood  kidney disease  liver disease  an unusual or allergic reaction to iron, other medicines, foods, dyes, or preservatives  pregnant or trying to get pregnant  breast-feeding How should I use this medicine? This medicine is for infusion into a vein. It is given by a health care professional in a hospital or clinic setting. Talk to your pediatrician regarding the use of this medicine in children. While this drug may be prescribed for children as young as 2 years for selected conditions, precautions do apply. Overdosage: If you think you have taken too much of this medicine contact a poison control center or emergency room at once. NOTE: This medicine is only for you. Do not share this medicine with others. What if I miss a dose? It is important not to miss your dose. Call your doctor or health care professional if you are unable to keep an appointment. What may interact with this medicine? Do not take this medicine with any of the following medications:  deferoxamine  dimercaprol  other iron products This medicine may also interact with the following medications:  chloramphenicol  deferasirox This list may not describe all possible interactions. Give your health care provider a list of all the medicines, herbs, non-prescription drugs, or  dietary supplements you use. Also tell them if you smoke, drink alcohol, or use illegal drugs. Some items may interact with your medicine. What should I watch for while using this medicine? Visit your doctor or healthcare professional regularly. Tell your doctor or healthcare professional if your symptoms do not start to get better or if they get worse. You may need blood work done while you are taking this medicine. You may need to follow a special diet. Talk to your doctor. Foods that contain iron include: whole grains/cereals, dried fruits, beans, or peas, leafy green vegetables, and organ meats (liver, kidney). What side effects may I notice from receiving this medicine? Side effects that you should report to your doctor or health care professional as soon as possible:  allergic reactions like skin rash, itching or hives, swelling of the face, lips, or tongue  breathing problems  changes in blood pressure  cough  fast, irregular heartbeat  feeling faint or lightheaded, falls  fever or chills  flushing, sweating, or hot feelings  joint or muscle aches/pains  seizures  swelling of the ankles or feet  unusually weak or tired Side effects that usually do not require medical attention (report to your doctor or health care professional if they continue or are bothersome):  diarrhea  feeling achy  headache  irritation at site where injected  nausea, vomiting  stomach upset  tiredness This list may not describe all possible side effects. Call your doctor for medical advice about side effects. You may report side effects to FDA at 1-800-FDA-1088. Where should I keep   my medicine? This drug is given in a hospital or clinic and will not be stored at home. NOTE: This sheet is a summary. It may not cover all possible information. If you have questions about this medicine, talk to your doctor, pharmacist, or health care provider.  2020 Elsevier/Gold Standard (2010-11-20  17:14:35) Coronavirus (COVID-19) Are you at risk?  Are you at risk for the Coronavirus (COVID-19)?  To be considered HIGH RISK for Coronavirus (COVID-19), you have to meet the following criteria:  . Traveled to [place], [place], [place], [place] or [place]; or in the [place] to [place], [place], [place], or [place]; and have fever, cough, and shortness of breath within the last 2 weeks of travel OR . Been in close contact with a person diagnosed with COVID-19 within the last 2 weeks and have fever, cough, and shortness of breath . IF YOU DO NOT MEET THESE CRITERIA, YOU ARE CONSIDERED LOW RISK FOR COVID-19.  What to do if you are HIGH RISK for COVID-19?  . If you are having a medical emergency, call 911. . Seek medical care right away. Before you go to a doctor's office, urgent care or emergency department, call ahead and tell them about your recent travel, contact with someone diagnosed with COVID-19, and your symptoms. You should receive instructions from your physician's office regarding next steps of care.  . When you arrive at healthcare provider, tell the healthcare staff immediately you have returned from visiting [place], [place], [place], [place] or [place]; or traveled in the [place] to [place], [place], [place], or [place]; in the last two weeks or you have been in close contact with a person diagnosed with COVID-19 in the last 2 weeks.   . Tell the health care staff about your symptoms: fever, cough and shortness of breath. . After you have been seen by a medical provider, you will be either: o Tested for (COVID-19) and discharged home on quarantine except to seek medical care if symptoms worsen, and asked to  - Stay home and avoid contact with others until you get your results (4-5 days)  - Avoid travel on public transportation if possible (such as bus, train, or airplane) or o Sent to the Emergency Department by EMS for evaluation, COVID-19 testing, and  possible admission depending on your condition and test results.  What to do if you are LOW RISK for COVID-19?  Reduce your risk of any infection by using the same precautions used for avoiding the common cold or flu:  . Wash your hands often with soap and warm water for at least 20 seconds.  If soap and water are not readily available, use an alcohol-based hand sanitizer with at least 60% alcohol.  . If coughing or sneezing, cover your mouth and nose by coughing or sneezing into the elbow areas of your shirt or coat, into a tissue or into your sleeve (not your hands). . Avoid shaking hands with others and consider head nods or verbal greetings only. . Avoid touching your eyes, nose, or mouth with unwashed hands.  . Avoid close contact with people who are sick. . Avoid places or events with large numbers of people in one location, like concerts or sporting events. . Carefully consider travel plans you have or are making. . If you are planning any travel outside or inside the [place], visit the CDC's Travelers' Health webpage for the latest health notices. . If you have some symptoms but not all   symptoms, continue to monitor at home and seek medical attention if your symptoms worsen. . If you are having a medical emergency, call 911.   ADDITIONAL HEALTHCARE OPTIONS FOR PATIENTS  [facility] Telehealth / e-Visit: [email or web address]         [facility]: [phone]  [facility]: [phone]                   [facility]: [phone]  

## 2018-12-23 DIAGNOSIS — I10 Essential (primary) hypertension: Secondary | ICD-10-CM | POA: Diagnosis not present

## 2018-12-23 DIAGNOSIS — N184 Chronic kidney disease, stage 4 (severe): Secondary | ICD-10-CM | POA: Diagnosis not present

## 2018-12-23 DIAGNOSIS — D649 Anemia, unspecified: Secondary | ICD-10-CM | POA: Diagnosis not present

## 2018-12-23 DIAGNOSIS — E1159 Type 2 diabetes mellitus with other circulatory complications: Secondary | ICD-10-CM | POA: Diagnosis not present

## 2018-12-27 ENCOUNTER — Ambulatory Visit: Payer: Medicare HMO | Admitting: Hematology and Oncology

## 2018-12-27 ENCOUNTER — Ambulatory Visit: Payer: Medicare HMO

## 2018-12-27 ENCOUNTER — Other Ambulatory Visit: Payer: Medicare HMO

## 2019-01-01 NOTE — Progress Notes (Signed)
Patient Care Team: [name], MD as PCP - General (Family Medicine) Michael Boston, MD as Consulting Physician (General Surgery) Sydnee Cabal, MD as Consulting Physician (Orthopedic Surgery)  DIAGNOSIS:    ICD-10-CM   1. Normocytic anemia  D64.9     CHIEF COMPLIANT: Follow-up of normocytic anemia on erythropoietin stimulation therapy  INTERVAL HISTORY: Bailey [name] is a 68 y.o. with above-mentioned history of normocytic anemia and B12 deficiency who is currently on erythropoietin stimulation therapy with Retacrit. She presents to the clinic today to review her labs.   REVIEW OF SYSTEMS:   Constitutional: Denies fevers, chills or abnormal weight loss Eyes: Denies blurriness of vision Ears, nose, mouth, throat, and face: Denies mucositis or sore throat Respiratory: Denies cough, dyspnea or wheezes Cardiovascular: Denies palpitation, chest discomfort Gastrointestinal: Denies nausea, heartburn or change in bowel habits Skin: Denies abnormal skin rashes Lymphatics: Denies new lymphadenopathy or easy bruising Neurological: Denies numbness, tingling or new weaknesses Behavioral/Psych: Mood is stable, no new changes  Extremities: No lower extremity edema Breast: denies any pain or lumps or nodules in either breasts All other systems were reviewed with the patient and are negative.  I have reviewed the past medical history, past surgical history, social history and family history with the patient and they are unchanged from previous note.  ALLERGIES:  is allergic to no known allergies.  MEDICATIONS:  Current Outpatient Medications  Medication Sig Dispense Refill  . acetaminophen (TYLENOL) 500 MG tablet Take 500 mg by mouth every 6 (six) hours as needed for moderate pain (pain).    . carvedilol (COREG) 12.5 MG tablet Take 12.5 mg by mouth 2 (two) times daily with a meal.    . ferrous sulfate 325 (65 FE) MG tablet Take 325 mg by mouth 2 (two) times daily with a meal.    .  HYDROcodone-acetaminophen (NORCO/VICODIN) 5-325 MG tablet Take 1 tablet by mouth every 4 (four) hours as needed for moderate pain. 40 tablet 0  . insulin glargine (LANTUS) 100 UNIT/ML injection Inject 15 Units into the skin daily. Takes at 1300    . sodium bicarbonate 650 MG tablet Take 650 mg by mouth 2 (two) times daily.     No current facility-administered medications for this visit.     PHYSICAL EXAMINATION: ECOG PERFORMANCE STATUS: 1 - Symptomatic but completely ambulatory  Vitals:   01/02/19 1532  BP: (!) 198/71  Pulse: 61  Resp: 17  Temp: 98.5 F (36.9 C)  SpO2: 100%   Filed Weights   01/02/19 1532  Weight: 133 lb 9.6 oz (60.6 kg)    GENERAL: alert, no distress and comfortable SKIN: skin color, texture, turgor are normal, no rashes or significant lesions EYES: normal, Conjunctiva are pink and non-injected, sclera clear OROPHARYNX: no exudate, no erythema and lips, buccal mucosa, and tongue normal  NECK: supple, thyroid normal size, non-tender, without nodularity LYMPH: no palpable lymphadenopathy in the cervical, axillary or inguinal LUNGS: clear to auscultation and percussion with normal breathing effort HEART: regular rate & rhythm and no murmurs and no lower extremity edema ABDOMEN: abdomen soft, non-tender and normal bowel sounds MUSCULOSKELETAL: no cyanosis of digits and no clubbing  NEURO: alert & oriented x 3 with fluent speech, no focal motor/sensory deficits EXTREMITIES: No lower extremity edema  LABORATORY DATA:  I have reviewed the data as listed CMP Latest Ref Rng & Units 08/14/2016 05/15/2016 05/14/2016  Glucose 65 - 99 mg/dL 92 110(H) 122(H)  BUN 6 - 20 mg/dL 29(H) 6 7  Creatinine 0.44 - 1.00 mg/dL 0.91 0.75 0.63  Sodium 135 - 145 mmol/L 139 139 138  Potassium 3.5 - 5.1 mmol/L 3.4(L) 3.0(L) 3.0(L)  Chloride 101 - 111 mmol/L 111 102 103  CO2 22 - 32 mmol/L 19(L) 31 29  Calcium 8.9 - 10.3 mg/dL 8.9 7.6(L) 7.5(L)  Total Protein 6.5 - 8.1 g/dL 7.1 - -   Total Bilirubin 0.3 - 1.2 mg/dL 0.6 - -  Alkaline Phos 38 - 126 U/L 75 - -  AST 15 - 41 U/L 20 - -  ALT 14 - 54 U/L 14 - -    Lab Results  Component Value Date   WBC 5.4 01/02/2019   HGB 8.4 (L) 01/02/2019   HCT 26.9 (L) 01/02/2019   MCV 91.8 01/02/2019   PLT 93 (L) 01/02/2019   NEUTROABS 2.8 01/02/2019    ASSESSMENT & PLAN:  Normocytic anemia 04/25/2018: Hemoglobin 9.8, MCV 89, platelets 129 08/09/2018: Hemoglobin 9.8, MCV 90.4, platelets 142 11/16/2018:Hemoglobin 7, MCV 94.7, platelets 188 01/02/2019: Hemoglobin 8.4, MCV 91.8, platelets 93  Lab review: I discussed with her that her blood counts are stable but have not improved significantly. I would like her to get a bone marrow biopsy for further evaluation.  Current treatment:  1. B12 injections monthly,  2.  Retacrit injections weekly  Severe uncontrolled hypertension: Still uncontrolled She was informed by nephrology that she will need to be placed on transplant list.  Patient will come weekly for Retacrit injections and in 4 weeks for follow-up with me.  No orders of the defined types were placed in this encounter.  The patient has a good understanding of the overall plan. she agrees with it. she will call with any problems that may develop before the next visit here.  Nicholas Lose, MD 01/02/2019  Julious Oka Dorshimer am acting as scribe for Dr. Nicholas Lose.  I have reviewed the above documentation for accuracy and completeness, and I agree with the above.

## 2019-01-02 ENCOUNTER — Other Ambulatory Visit: Payer: Self-pay

## 2019-01-02 ENCOUNTER — Inpatient Hospital Stay: Payer: Medicare HMO

## 2019-01-02 ENCOUNTER — Inpatient Hospital Stay: Payer: Medicare HMO | Attending: Hematology and Oncology | Admitting: Hematology and Oncology

## 2019-01-02 VITALS — BP 174/70 | HR 68 | Resp 18

## 2019-01-02 DIAGNOSIS — E119 Type 2 diabetes mellitus without complications: Secondary | ICD-10-CM | POA: Diagnosis not present

## 2019-01-02 DIAGNOSIS — D649 Anemia, unspecified: Secondary | ICD-10-CM

## 2019-01-02 DIAGNOSIS — Z794 Long term (current) use of insulin: Secondary | ICD-10-CM | POA: Insufficient documentation

## 2019-01-02 DIAGNOSIS — Z862 Personal history of diseases of the blood and blood-forming organs and certain disorders involving the immune mechanism: Secondary | ICD-10-CM

## 2019-01-02 DIAGNOSIS — I10 Essential (primary) hypertension: Secondary | ICD-10-CM | POA: Insufficient documentation

## 2019-01-02 DIAGNOSIS — Z79899 Other long term (current) drug therapy: Secondary | ICD-10-CM | POA: Diagnosis not present

## 2019-01-02 DIAGNOSIS — E538 Deficiency of other specified B group vitamins: Secondary | ICD-10-CM | POA: Insufficient documentation

## 2019-01-02 LAB — CBC WITH DIFFERENTIAL (CANCER CENTER ONLY)
Abs Immature Granulocytes: 0.01 10*3/uL (ref 0.00–0.07)
Basophils Absolute: 0 10*3/uL (ref 0.0–0.1)
Basophils Relative: 0 %
Eosinophils Absolute: 0.3 10*3/uL (ref 0.0–0.5)
Eosinophils Relative: 6 %
HCT: 26.9 % — ABNORMAL LOW (ref 36.0–46.0)
Hemoglobin: 8.4 g/dL — ABNORMAL LOW (ref 12.0–15.0)
Immature Granulocytes: 0 %
Lymphocytes Relative: 35 %
Lymphs Abs: 1.9 10*3/uL (ref 0.7–4.0)
MCH: 28.7 pg (ref 26.0–34.0)
MCHC: 31.2 g/dL (ref 30.0–36.0)
MCV: 91.8 fL (ref 80.0–100.0)
Monocytes Absolute: 0.3 10*3/uL (ref 0.1–1.0)
Monocytes Relative: 6 %
Neutro Abs: 2.8 10*3/uL (ref 1.7–7.7)
Neutrophils Relative %: 53 %
Platelet Count: 93 10*3/uL — ABNORMAL LOW (ref 150–400)
RBC: 2.93 MIL/uL — ABNORMAL LOW (ref 3.87–5.11)
RDW: 15 % (ref 11.5–15.5)
WBC Count: 5.4 10*3/uL (ref 4.0–10.5)
nRBC: 0 % (ref 0.0–0.2)

## 2019-01-02 MED ORDER — EPOETIN ALFA-EPBX 40000 UNIT/ML IJ SOLN
40000.0000 [IU] | Freq: Once | INTRAMUSCULAR | Status: AC
  Administered 2019-01-02: 40000 [IU] via SUBCUTANEOUS

## 2019-01-02 MED ORDER — EPOETIN ALFA-EPBX 40000 UNIT/ML IJ SOLN
INTRAMUSCULAR | Status: AC
  Filled 2019-01-02: qty 1

## 2019-01-02 MED ORDER — CYANOCOBALAMIN 1000 MCG/ML IJ SOLN
1000.0000 ug | Freq: Once | INTRAMUSCULAR | Status: AC
  Administered 2019-01-02: 1000 ug via INTRAMUSCULAR

## 2019-01-02 MED ORDER — CYANOCOBALAMIN 1000 MCG/ML IJ SOLN
INTRAMUSCULAR | Status: AC
  Filled 2019-01-02: qty 1

## 2019-01-02 NOTE — Assessment & Plan Note (Signed)
04/25/2018: Hemoglobin 9.8, MCV 89, platelets 129 08/09/2018: Hemoglobin 9.8, MCV 90.4, platelets 142 11/16/2018:Hemoglobin 7, MCV 94.7, platelets 188  Current treatment:  1. B12 injections monthly,  2.  Retacrit injections weekly  Severe uncontrolled hypertension: Still uncontrolled She was informed by nephrology that she will need to be placed on transplant list.  Patient will come weekly for Retacrit injections and in 4 weeks for follow-up with me.

## 2019-01-02 NOTE — Patient Instructions (Signed)
Cyanocobalamin, Vitamin B12 injection What is this medicine? CYANOCOBALAMIN (sye an oh koe BAL a min) is a man made form of vitamin B12. Vitamin B12 is used in the growth of healthy blood cells, nerve cells, and proteins in the body. It also helps with the metabolism of fats and carbohydrates. This medicine is used to treat people who can not absorb vitamin B12. This medicine may be used for other purposes; ask your health care provider or pharmacist if you have questions. COMMON BRAND NAME(S): B-12 Compliance Kit, B-12 Injection Kit, Cyomin, LA-12, Nutri-Twelve, Physicians EZ Use B-12, Primabalt What should I tell my health care provider before I take this medicine? They need to know if you have any of these conditions:  kidney disease  Leber's disease  megaloblastic anemia  an unusual or allergic reaction to cyanocobalamin, cobalt, other medicines, foods, dyes, or preservatives  pregnant or trying to get pregnant  breast-feeding How should I use this medicine? This medicine is injected into a muscle or deeply under the skin. It is usually given by a health care professional in a clinic or doctor's office. However, your doctor may teach you how to inject yourself. Follow all instructions. Talk to your pediatrician regarding the use of this medicine in children. Special care may be needed. Overdosage: If you think you have taken too much of this medicine contact a poison control center or emergency room at once. NOTE: This medicine is only for you. Do not share this medicine with others. What if I miss a dose? If you are given your dose at a clinic or doctor's office, call to reschedule your appointment. If you give your own injections and you miss a dose, take it as soon as you can. If it is almost time for your next dose, take only that dose. Do not take double or extra doses. What may interact with this medicine?  colchicine  heavy alcohol intake This list may not describe all  possible interactions. Give your health care provider a list of all the medicines, herbs, non-prescription drugs, or dietary supplements you use. Also tell them if you smoke, drink alcohol, or use illegal drugs. Some items may interact with your medicine. What should I watch for while using this medicine? Visit your doctor or health care professional regularly. You may need blood work done while you are taking this medicine. You may need to follow a special diet. Talk to your doctor. Limit your alcohol intake and avoid smoking to get the best benefit. What side effects may I notice from receiving this medicine? Side effects that you should report to your doctor or health care professional as soon as possible:  allergic reactions like skin rash, itching or hives, swelling of the face, lips, or tongue  blue tint to skin  chest tightness, pain  difficulty breathing, wheezing  dizziness  red, swollen painful area on the leg Side effects that usually do not require medical attention (report to your doctor or health care professional if they continue or are bothersome):  diarrhea  headache This list may not describe all possible side effects. Call your doctor for medical advice about side effects. You may report side effects to FDA at 1-800-FDA-1088. Where should I keep my medicine? Keep out of the reach of children. Store at room temperature between 15 and 30 degrees C (59 and 85 degrees F). Protect from light. Throw away any unused medicine after the expiration date. NOTE: This sheet is a summary. It may not cover  all possible information. If you have questions about this medicine, talk to your doctor, pharmacist, or health care provider.  2020 Elsevier/Gold Standard (2007-05-23 22:10:20) Epoetin Alfa injection What is this medicine? EPOETIN ALFA (e POE e tin AL fa) helps your body make more red blood cells. This medicine is used to treat anemia caused by chronic kidney disease, cancer  chemotherapy, or HIV-therapy. It may also be used before surgery if you have anemia. This medicine may be used for other purposes; ask your health care provider or pharmacist if you have questions. COMMON BRAND NAME(S): Epogen, Procrit, Retacrit What should I tell my health care provider before I take this medicine? They need to know if you have any of these conditions:  cancer  heart disease  high blood pressure  history of blood clots  history of stroke  low levels of folate, iron, or vitamin B12 in the blood  seizures  an unusual or allergic reaction to erythropoietin, albumin, benzyl alcohol, hamster proteins, other medicines, foods, dyes, or preservatives  pregnant or trying to get pregnant  breast-feeding How should I use this medicine? This medicine is for injection into a vein or under the skin. It is usually given by a health care professional in a hospital or clinic setting. If you get this medicine at home, you will be taught how to prepare and give this medicine. Use exactly as directed. Take your medicine at regular intervals. Do not take your medicine more often than directed. It is important that you put your used needles and syringes in a special sharps container. Do not put them in a trash can. If you do not have a sharps container, call your pharmacist or healthcare provider to get one. A special MedGuide will be given to you by the pharmacist with each prescription and refill. Be sure to read this information carefully each time. Talk to your pediatrician regarding the use of this medicine in children. While this drug may be prescribed for selected conditions, precautions do apply. Overdosage: If you think you have taken too much of this medicine contact a poison control center or emergency room at once. NOTE: This medicine is only for you. Do not share this medicine with others. What if I miss a dose? If you miss a dose, take it as soon as you can. If it is  almost time for your next dose, take only that dose. Do not take double or extra doses. What may interact with this medicine? Interactions have not been studied. This list may not describe all possible interactions. Give your health care provider a list of all the medicines, herbs, non-prescription drugs, or dietary supplements you use. Also tell them if you smoke, drink alcohol, or use illegal drugs. Some items may interact with your medicine. What should I watch for while using this medicine? Your condition will be monitored carefully while you are receiving this medicine. You may need blood work done while you are taking this medicine. This medicine may cause a decrease in vitamin B6. You should make sure that you get enough vitamin B6 while you are taking this medicine. Discuss the foods you eat and the vitamins you take with your health care professional. What side effects may I notice from receiving this medicine? Side effects that you should report to your doctor or health care professional as soon as possible:  allergic reactions like skin rash, itching or hives, swelling of the face, lips, or tongue  seizures  signs and symptoms of   a blood clot such as breathing problems; changes in vision; chest pain; severe, sudden headache; pain, swelling, warmth in the leg; trouble speaking; sudden numbness or weakness of the face, arm or leg  signs and symptoms of a stroke like changes in vision; confusion; trouble speaking or understanding; severe headaches; sudden numbness or weakness of the face, arm or leg; trouble walking; dizziness; loss of balance or coordination Side effects that usually do not require medical attention (report to your doctor or health care professional if they continue or are bothersome):  chills  cough  dizziness  fever  headaches  joint pain  muscle cramps  muscle pain  nausea, vomiting  pain, redness, or irritation at site where injected This list may  not describe all possible side effects. Call your doctor for medical advice about side effects. You may report side effects to FDA at 1-800-FDA-1088. Where should I keep my medicine? Keep out of the reach of children. Store in a refrigerator between 2 and 8 degrees C (36 and 46 degrees F). Do not freeze or shake. Throw away any unused portion if using a single-dose vial. Multi-dose vials can be kept in the refrigerator for up to 21 days after the initial dose. Throw away unused medicine. NOTE: This sheet is a summary. It may not cover all possible information. If you have questions about this medicine, talk to your doctor, pharmacist, or health care provider.  2020 Elsevier/Gold Standard (2016-09-18 08:35:19)

## 2019-01-02 NOTE — Progress Notes (Signed)
Patient scheduled for bone marrow biopsy per MD recommendations on 11/11.  Approved by Surveyor, quantity.  MD will perform biopsy.  Pt notified.  [facility] notified.

## 2019-01-02 NOTE — Progress Notes (Signed)
Pt receiving Retacrit today. BP pressure is 198/71 doctor is aware of BP and is ok with giving injection per desk nurse.

## 2019-01-03 ENCOUNTER — Telehealth: Payer: Self-pay | Admitting: Hematology and Oncology

## 2019-01-03 ENCOUNTER — Other Ambulatory Visit: Payer: Self-pay

## 2019-01-03 ENCOUNTER — Telehealth: Payer: Self-pay

## 2019-01-03 DIAGNOSIS — N184 Chronic kidney disease, stage 4 (severe): Secondary | ICD-10-CM | POA: Diagnosis not present

## 2019-01-03 DIAGNOSIS — R5383 Other fatigue: Secondary | ICD-10-CM | POA: Diagnosis not present

## 2019-01-03 DIAGNOSIS — N390 Urinary tract infection, site not specified: Secondary | ICD-10-CM | POA: Diagnosis not present

## 2019-01-03 DIAGNOSIS — E538 Deficiency of other specified B group vitamins: Secondary | ICD-10-CM

## 2019-01-03 DIAGNOSIS — D649 Anemia, unspecified: Secondary | ICD-10-CM

## 2019-01-03 DIAGNOSIS — I10 Essential (primary) hypertension: Secondary | ICD-10-CM | POA: Diagnosis not present

## 2019-01-03 NOTE — Telephone Encounter (Signed)
I left a message regarding schedule  

## 2019-01-03 NOTE — Telephone Encounter (Signed)
Patient's husband, Timmothy Sours, requesting to cancel upcoming bone marrow biopsy on 11/11.  Per husband patient prefers to have this under sedation.   RN informed MD.  Orders placed for CT bone marrow biopsy.  Pt and patient's husband notified.

## 2019-01-04 ENCOUNTER — Other Ambulatory Visit: Payer: Medicare HMO

## 2019-01-05 ENCOUNTER — Telehealth: Payer: Self-pay | Admitting: Hematology and Oncology

## 2019-01-05 ENCOUNTER — Telehealth: Payer: Self-pay

## 2019-01-05 NOTE — Telephone Encounter (Signed)
Patient returned phone call regarding voicemail that was left, 11/16 appointment has moved to 11/17. All future appointments have been rescheduled for a later time.

## 2019-01-05 NOTE — Telephone Encounter (Signed)
Returned patient's phone call regarding rescheduling an appointment, left a voicemail. 

## 2019-01-05 NOTE — Telephone Encounter (Signed)
Spouse called to change appt on 11/16 from 2:30 to 3pm.   Dr Lindi Adie does not have this time available. Returned call and lvm advising spouse that someone from the scheduling dept will contact him to get pt rescheduled. High priority in basket msg sent to scheduling dept.

## 2019-01-06 DIAGNOSIS — N184 Chronic kidney disease, stage 4 (severe): Secondary | ICD-10-CM | POA: Diagnosis not present

## 2019-01-06 DIAGNOSIS — E1142 Type 2 diabetes mellitus with diabetic polyneuropathy: Secondary | ICD-10-CM | POA: Diagnosis not present

## 2019-01-06 DIAGNOSIS — N390 Urinary tract infection, site not specified: Secondary | ICD-10-CM | POA: Diagnosis not present

## 2019-01-06 DIAGNOSIS — I10 Essential (primary) hypertension: Secondary | ICD-10-CM | POA: Diagnosis not present

## 2019-01-09 ENCOUNTER — Ambulatory Visit: Payer: Medicare HMO

## 2019-01-09 ENCOUNTER — Ambulatory Visit: Payer: Medicare HMO | Admitting: Hematology and Oncology

## 2019-01-09 ENCOUNTER — Telehealth: Payer: Self-pay | Admitting: Hematology and Oncology

## 2019-01-09 NOTE — Telephone Encounter (Signed)
Per desk nurse moved 11/27 f/u to 12/7 due to bx 11/27. Injection appointments remain the same. Confirmed with spouse.

## 2019-01-10 ENCOUNTER — Inpatient Hospital Stay: Payer: Medicare HMO

## 2019-01-10 ENCOUNTER — Other Ambulatory Visit: Payer: Self-pay

## 2019-01-10 ENCOUNTER — Other Ambulatory Visit: Payer: Self-pay | Admitting: Hematology and Oncology

## 2019-01-10 ENCOUNTER — Ambulatory Visit: Payer: Medicare HMO | Admitting: Hematology and Oncology

## 2019-01-10 VITALS — BP 159/71 | HR 62 | Temp 98.2°F | Resp 18

## 2019-01-10 DIAGNOSIS — Z862 Personal history of diseases of the blood and blood-forming organs and certain disorders involving the immune mechanism: Secondary | ICD-10-CM

## 2019-01-10 DIAGNOSIS — E538 Deficiency of other specified B group vitamins: Secondary | ICD-10-CM | POA: Diagnosis not present

## 2019-01-10 DIAGNOSIS — Z79899 Other long term (current) drug therapy: Secondary | ICD-10-CM | POA: Diagnosis not present

## 2019-01-10 DIAGNOSIS — D649 Anemia, unspecified: Secondary | ICD-10-CM

## 2019-01-10 DIAGNOSIS — N189 Chronic kidney disease, unspecified: Secondary | ICD-10-CM

## 2019-01-10 DIAGNOSIS — Z794 Long term (current) use of insulin: Secondary | ICD-10-CM | POA: Diagnosis not present

## 2019-01-10 DIAGNOSIS — I10 Essential (primary) hypertension: Secondary | ICD-10-CM | POA: Diagnosis not present

## 2019-01-10 DIAGNOSIS — E119 Type 2 diabetes mellitus without complications: Secondary | ICD-10-CM | POA: Diagnosis not present

## 2019-01-10 LAB — CBC WITH DIFFERENTIAL (CANCER CENTER ONLY)
Abs Immature Granulocytes: 0.01 10*3/uL (ref 0.00–0.07)
Basophils Absolute: 0.1 10*3/uL (ref 0.0–0.1)
Basophils Relative: 1 %
Eosinophils Absolute: 0.3 10*3/uL (ref 0.0–0.5)
Eosinophils Relative: 5 %
HCT: 29.4 % — ABNORMAL LOW (ref 36.0–46.0)
Hemoglobin: 8.8 g/dL — ABNORMAL LOW (ref 12.0–15.0)
Immature Granulocytes: 0 %
Lymphocytes Relative: 38 %
Lymphs Abs: 1.9 10*3/uL (ref 0.7–4.0)
MCH: 28.9 pg (ref 26.0–34.0)
MCHC: 29.9 g/dL — ABNORMAL LOW (ref 30.0–36.0)
MCV: 96.7 fL (ref 80.0–100.0)
Monocytes Absolute: 0.3 10*3/uL (ref 0.1–1.0)
Monocytes Relative: 7 %
Neutro Abs: 2.5 10*3/uL (ref 1.7–7.7)
Neutrophils Relative %: 49 %
Platelet Count: 104 10*3/uL — ABNORMAL LOW (ref 150–400)
RBC: 3.04 MIL/uL — ABNORMAL LOW (ref 3.87–5.11)
RDW: 16.5 % — ABNORMAL HIGH (ref 11.5–15.5)
WBC Count: 5 10*3/uL (ref 4.0–10.5)
nRBC: 0 % (ref 0.0–0.2)

## 2019-01-10 MED ORDER — EPOETIN ALFA-EPBX 40000 UNIT/ML IJ SOLN
40000.0000 [IU] | Freq: Once | INTRAMUSCULAR | Status: AC
  Administered 2019-01-10: 16:00:00 40000 [IU] via SUBCUTANEOUS

## 2019-01-10 MED ORDER — EPOETIN ALFA-EPBX 40000 UNIT/ML IJ SOLN
INTRAMUSCULAR | Status: AC
  Filled 2019-01-10: qty 1

## 2019-01-10 NOTE — Patient Instructions (Signed)

## 2019-01-11 ENCOUNTER — Other Ambulatory Visit: Payer: Self-pay | Admitting: Family Medicine

## 2019-01-12 ENCOUNTER — Telehealth: Payer: Self-pay | Admitting: Hematology and Oncology

## 2019-01-12 NOTE — Telephone Encounter (Signed)
Scheduled appt per 11/16 staff message from RN - added labs before injections. Husband aware

## 2019-01-13 ENCOUNTER — Other Ambulatory Visit: Payer: Self-pay | Admitting: Family Medicine

## 2019-01-13 ENCOUNTER — Telehealth: Payer: Self-pay | Admitting: Hematology and Oncology

## 2019-01-13 NOTE — Telephone Encounter (Signed)
Returned patient's phone call regarding rescheduling 11/23 appointment, per patient's request 11/23 appointment has moved to 11/24.

## 2019-01-16 ENCOUNTER — Inpatient Hospital Stay: Payer: Medicare HMO

## 2019-01-16 ENCOUNTER — Ambulatory Visit: Payer: Medicare HMO

## 2019-01-16 DIAGNOSIS — I129 Hypertensive chronic kidney disease with stage 1 through stage 4 chronic kidney disease, or unspecified chronic kidney disease: Secondary | ICD-10-CM | POA: Diagnosis not present

## 2019-01-16 DIAGNOSIS — N184 Chronic kidney disease, stage 4 (severe): Secondary | ICD-10-CM | POA: Diagnosis not present

## 2019-01-16 DIAGNOSIS — D631 Anemia in chronic kidney disease: Secondary | ICD-10-CM | POA: Diagnosis not present

## 2019-01-16 DIAGNOSIS — N2581 Secondary hyperparathyroidism of renal origin: Secondary | ICD-10-CM | POA: Diagnosis not present

## 2019-01-17 ENCOUNTER — Inpatient Hospital Stay: Payer: Medicare HMO

## 2019-01-17 ENCOUNTER — Other Ambulatory Visit: Payer: Self-pay

## 2019-01-17 VITALS — BP 172/82 | HR 68 | Temp 98.2°F | Resp 18

## 2019-01-17 DIAGNOSIS — Z794 Long term (current) use of insulin: Secondary | ICD-10-CM | POA: Diagnosis not present

## 2019-01-17 DIAGNOSIS — Z79899 Other long term (current) drug therapy: Secondary | ICD-10-CM | POA: Diagnosis not present

## 2019-01-17 DIAGNOSIS — E538 Deficiency of other specified B group vitamins: Secondary | ICD-10-CM

## 2019-01-17 DIAGNOSIS — Z862 Personal history of diseases of the blood and blood-forming organs and certain disorders involving the immune mechanism: Secondary | ICD-10-CM

## 2019-01-17 DIAGNOSIS — I10 Essential (primary) hypertension: Secondary | ICD-10-CM | POA: Diagnosis not present

## 2019-01-17 DIAGNOSIS — D649 Anemia, unspecified: Secondary | ICD-10-CM | POA: Diagnosis not present

## 2019-01-17 DIAGNOSIS — E119 Type 2 diabetes mellitus without complications: Secondary | ICD-10-CM | POA: Diagnosis not present

## 2019-01-17 LAB — CBC WITH DIFFERENTIAL (CANCER CENTER ONLY)
Abs Immature Granulocytes: 0.01 10*3/uL (ref 0.00–0.07)
Basophils Absolute: 0 10*3/uL (ref 0.0–0.1)
Basophils Relative: 1 %
Eosinophils Absolute: 0.3 10*3/uL (ref 0.0–0.5)
Eosinophils Relative: 5 %
HCT: 33.7 % — ABNORMAL LOW (ref 36.0–46.0)
Hemoglobin: 10.1 g/dL — ABNORMAL LOW (ref 12.0–15.0)
Immature Granulocytes: 0 %
Lymphocytes Relative: 38 %
Lymphs Abs: 1.8 10*3/uL (ref 0.7–4.0)
MCH: 28.6 pg (ref 26.0–34.0)
MCHC: 30 g/dL (ref 30.0–36.0)
MCV: 95.5 fL (ref 80.0–100.0)
Monocytes Absolute: 0.4 10*3/uL (ref 0.1–1.0)
Monocytes Relative: 7 %
Neutro Abs: 2.4 10*3/uL (ref 1.7–7.7)
Neutrophils Relative %: 49 %
Platelet Count: 122 10*3/uL — ABNORMAL LOW (ref 150–400)
RBC: 3.53 MIL/uL — ABNORMAL LOW (ref 3.87–5.11)
RDW: 16.3 % — ABNORMAL HIGH (ref 11.5–15.5)
WBC Count: 4.8 10*3/uL (ref 4.0–10.5)
nRBC: 0 % (ref 0.0–0.2)

## 2019-01-17 MED ORDER — EPOETIN ALFA-EPBX 40000 UNIT/ML IJ SOLN
INTRAMUSCULAR | Status: AC
  Filled 2019-01-17: qty 1

## 2019-01-17 MED ORDER — EPOETIN ALFA-EPBX 40000 UNIT/ML IJ SOLN
40000.0000 [IU] | Freq: Once | INTRAMUSCULAR | Status: AC
  Administered 2019-01-17: 40000 [IU] via SUBCUTANEOUS

## 2019-01-17 NOTE — Patient Instructions (Signed)

## 2019-01-17 NOTE — Progress Notes (Signed)
Dr.Gudena is aware of her High blood pressure and wants to proceed with her injection at this time.

## 2019-01-18 ENCOUNTER — Other Ambulatory Visit: Payer: Self-pay | Admitting: Family Medicine

## 2019-01-18 ENCOUNTER — Other Ambulatory Visit: Payer: Self-pay | Admitting: Radiology

## 2019-01-18 DIAGNOSIS — W19XXXA Unspecified fall, initial encounter: Secondary | ICD-10-CM

## 2019-01-18 DIAGNOSIS — R531 Weakness: Secondary | ICD-10-CM

## 2019-01-18 DIAGNOSIS — R413 Other amnesia: Secondary | ICD-10-CM

## 2019-01-18 DIAGNOSIS — R410 Disorientation, unspecified: Secondary | ICD-10-CM

## 2019-01-18 DIAGNOSIS — R42 Dizziness and giddiness: Secondary | ICD-10-CM

## 2019-01-20 ENCOUNTER — Ambulatory Visit (HOSPITAL_COMMUNITY)
Admission: RE | Admit: 2019-01-20 | Discharge: 2019-01-20 | Disposition: A | Discharge status: Home / Self Care | Payer: Medicare HMO | Source: Ambulatory Visit | Attending: Hematology and Oncology | Admitting: Hematology and Oncology

## 2019-01-20 ENCOUNTER — Encounter (HOSPITAL_COMMUNITY): Payer: Self-pay

## 2019-01-20 ENCOUNTER — Other Ambulatory Visit: Payer: Self-pay

## 2019-01-20 DIAGNOSIS — Z794 Long term (current) use of insulin: Secondary | ICD-10-CM | POA: Insufficient documentation

## 2019-01-20 DIAGNOSIS — D509 Iron deficiency anemia, unspecified: Secondary | ICD-10-CM | POA: Insufficient documentation

## 2019-01-20 DIAGNOSIS — Z7901 Long term (current) use of anticoagulants: Secondary | ICD-10-CM | POA: Insufficient documentation

## 2019-01-20 DIAGNOSIS — Z79899 Other long term (current) drug therapy: Secondary | ICD-10-CM | POA: Insufficient documentation

## 2019-01-20 DIAGNOSIS — E11319 Type 2 diabetes mellitus with unspecified diabetic retinopathy without macular edema: Secondary | ICD-10-CM | POA: Diagnosis not present

## 2019-01-20 DIAGNOSIS — D7589 Other specified diseases of blood and blood-forming organs: Secondary | ICD-10-CM | POA: Diagnosis not present

## 2019-01-20 DIAGNOSIS — D649 Anemia, unspecified: Secondary | ICD-10-CM

## 2019-01-20 DIAGNOSIS — N189 Chronic kidney disease, unspecified: Secondary | ICD-10-CM | POA: Diagnosis not present

## 2019-01-20 DIAGNOSIS — D72822 Plasmacytosis: Secondary | ICD-10-CM | POA: Insufficient documentation

## 2019-01-20 DIAGNOSIS — E538 Deficiency of other specified B group vitamins: Secondary | ICD-10-CM | POA: Diagnosis not present

## 2019-01-20 LAB — CBC WITH DIFFERENTIAL/PLATELET
Abs Immature Granulocytes: 0.05 10*3/uL (ref 0.00–0.07)
Basophils Absolute: 0.1 10*3/uL (ref 0.0–0.1)
Basophils Relative: 1 %
Eosinophils Absolute: 0.3 10*3/uL (ref 0.0–0.5)
Eosinophils Relative: 6 %
HCT: 37.1 % (ref 36.0–46.0)
Hemoglobin: 10.8 g/dL — ABNORMAL LOW (ref 12.0–15.0)
Immature Granulocytes: 1 %
Lymphocytes Relative: 32 %
Lymphs Abs: 1.8 10*3/uL (ref 0.7–4.0)
MCH: 28.8 pg (ref 26.0–34.0)
MCHC: 29.1 g/dL — ABNORMAL LOW (ref 30.0–36.0)
MCV: 98.9 fL (ref 80.0–100.0)
Monocytes Absolute: 0.4 10*3/uL (ref 0.1–1.0)
Monocytes Relative: 7 %
Neutro Abs: 3 10*3/uL (ref 1.7–7.7)
Neutrophils Relative %: 53 %
Platelets: 127 10*3/uL — ABNORMAL LOW (ref 150–400)
RBC: 3.75 MIL/uL — ABNORMAL LOW (ref 3.87–5.11)
RDW: 16.2 % — ABNORMAL HIGH (ref 11.5–15.5)
WBC: 5.6 10*3/uL (ref 4.0–10.5)
nRBC: 0.4 % — ABNORMAL HIGH (ref 0.0–0.2)

## 2019-01-20 LAB — PROTIME-INR
INR: 1.2 (ref 0.8–1.2)
Prothrombin Time: 15.1 seconds (ref 11.4–15.2)

## 2019-01-20 LAB — BASIC METABOLIC PANEL
Anion gap: 9 (ref 5–15)
BUN: 58 mg/dL — ABNORMAL HIGH (ref 8–23)
CO2: 17 mmol/L — ABNORMAL LOW (ref 22–32)
Calcium: 8.5 mg/dL — ABNORMAL LOW (ref 8.9–10.3)
Chloride: 117 mmol/L — ABNORMAL HIGH (ref 98–111)
Creatinine, Ser: 5.4 mg/dL — ABNORMAL HIGH (ref 0.44–1.00)
GFR calc Af Amer: 9 mL/min — ABNORMAL LOW (ref >60)
GFR calc non Af Amer: 7 mL/min — ABNORMAL LOW (ref >60)
Glucose, Bld: 116 mg/dL — ABNORMAL HIGH (ref 70–99)
Potassium: 4.1 mmol/L (ref 3.5–5.1)
Sodium: 143 mmol/L (ref 135–145)

## 2019-01-20 LAB — GLUCOSE, CAPILLARY: Glucose-Capillary: 101 mg/dL — ABNORMAL HIGH (ref 70–99)

## 2019-01-20 MED ORDER — MIDAZOLAM HCL 2 MG/2ML IJ SOLN
INTRAMUSCULAR | Status: AC
  Filled 2019-01-20: qty 4

## 2019-01-20 MED ORDER — FENTANYL CITRATE (PF) 100 MCG/2ML IJ SOLN
INTRAMUSCULAR | Status: AC | PRN
  Administered 2019-01-20 (×2): 50 ug via INTRAVENOUS

## 2019-01-20 MED ORDER — FENTANYL CITRATE (PF) 100 MCG/2ML IJ SOLN
INTRAMUSCULAR | Status: AC
  Filled 2019-01-20: qty 2

## 2019-01-20 MED ORDER — SODIUM CHLORIDE 0.9 % IV SOLN
INTRAVENOUS | Status: DC
  Administered 2019-01-20: 09:00:00 via INTRAVENOUS

## 2019-01-20 MED ORDER — MIDAZOLAM HCL 2 MG/2ML IJ SOLN
INTRAMUSCULAR | Status: AC | PRN
  Administered 2019-01-20 (×2): 1 mg via INTRAVENOUS

## 2019-01-20 NOTE — Procedures (Signed)
Pre-procedure Diagnosis: Plasmacytosis Post-procedure Diagnosis: Same  Technically successful CT guided bone marrow aspiration and biopsy of left iliac crest.   Complications: None Immediate  EBL: None  Signed: Sandi Mariscal Pager: [phone] 01/20/2019, 11:37 AM

## 2019-01-20 NOTE — H&P (Signed)
Referring Physician(s): Nicholas Lose  Supervising Physician: Sandi Mariscal  Patient Status:  [facility]  Chief Complaint: "I'm having another bone marrow biopsy"  Subjective: Patient familiar to IR service from bone marrow biopsy in 2019 and right random renal biopsy on 11/01/2018.  She has a history of DM, CKD,  B12 deficiency with persistent normocytic anemia despite B12 injections.  She also had mild plasmacytosis on previous bone marrow biopsy. She presents again today for repeat CT-guided bone marrow biopsy for further evaluation.  She currently denies fever, headache, chest pain, dyspnea, cough, abdominal/back pain, nausea, vomiting or bleeding.  Past Medical History:  Diagnosis Date  . Chronic kidney disease 05/11/2016   ARF-- dehydration  resolved by  discharged  . Diabetes mellitus    Type II  . Diabetic retinopathy ([place])   . Diverticulitis   . History of kidney stones    passed- 6  . Osteomyelitis ([place])   . Vitamin B 12 deficiency 06/09/2017   Past Surgical History:  Procedure Laterality Date  . AMPUTATION Right 08/14/2016   Procedure: RIGHT 1ST RAY AMPUTATION MID-SHAFT;  Surgeon: Newt Minion, MD;  Location: [facility];  Service: Orthopedics;  Laterality: Right;  . INCISION AND DRAINAGE ABSCESS Left 02/11/2014   Procedure: INCISION AND DRAINAGE ABSCESS Left Buttock;  Surgeon: Jackolyn Confer, MD;  Location: [facility];  Service: General;  Laterality: Left;  . LAPAROSCOPIC SMALL BOWEL RESECTION N/A 05/09/2016   Procedure: LAPAROSCOPIC SMALL BOWEL RESECTION;  Surgeon: Michael Boston, MD;  Location: [facility];  Service: General;  Laterality: N/A;  . LAPAROSCOPY N/A 05/09/2016   Procedure: LAPAROSCOPY DIAGNOSTIC, LYSIS OF ADHESIONS, SMALL BOWEL RESECTION X 2;  Surgeon: Michael Boston, MD;  Location: [facility];  Service: General;  Laterality: N/A;  . TOE AMPUTATION     left foot great toe     Allergies: No known allergies  Medications: Prior to Admission medications   Medication Sig Start  Date End Date Taking? Authorizing Provider  carvedilol (COREG) 12.5 MG tablet Take 12.5 mg by mouth 2 (two) times daily with a meal.   Yes [provider]  sodium bicarbonate 650 MG tablet Take 650 mg by mouth 2 (two) times daily.   Yes [provider]  acetaminophen (TYLENOL) 500 MG tablet Take 500 mg by mouth every 6 (six) hours as needed for moderate pain (pain).    [provider]  ferrous sulfate 325 (65 FE) MG tablet Take 325 mg by mouth 2 (two) times daily with a meal.    [provider]  HYDROcodone-acetaminophen (NORCO/VICODIN) 5-325 MG tablet Take 1 tablet by mouth every 4 (four) hours as needed for moderate pain. 08/14/16   Newt Minion, MD  insulin glargine (LANTUS) 100 UNIT/ML injection Inject 15 Units into the skin daily. Takes at 1300    [provider]     Vital Signs: Blood pressure 162/73, heart rate 65, respirations 14, O2 sat 100% room air, temp 98.1 Ht 5' 4"  (1.626 m)   Wt 127 lb (57.6 kg)   BMI 21.80 kg/m   Physical Exam awake, alert.  Chest with few fine right basilar crackles, left clear.  Heart with regular rate and rhythm.  Abdomen soft, positive bowel sounds, nontender.  No lower extremity edema.  Imaging: No results found.  Labs:  CBC: Recent Labs    01/02/19 1443 01/10/19 1511 01/17/19 1504 01/20/19 0940  WBC 5.4 5.0 4.8 5.6  HGB 8.4* 8.8* 10.1* 10.8*  HCT 26.9* 29.4* 33.7* 37.1  PLT 93* 104* 122* 127*    COAGS: Recent Labs    11/01/18 0549 01/20/19 0940  INR 1.1 1.2  APTT 31  --     BMP: Recent Labs    01/20/19 0940  NA 143  K 4.1  CL 117*  CO2 17*  GLUCOSE 116*  BUN 58*  CALCIUM 8.5*  CREATININE 5.40*  GFRNONAA 7*  GFRAA 9*    LIVER FUNCTION TESTS: No results for input(s): BILITOT, AST, ALT, ALKPHOS, PROT, ALBUMIN in the last 8760 hours.  Assessment and Plan: Pt with history of DM,  CKD (creat 5.4 today), B12 deficiency and persistent normocytic anemia despite B12 injections.   She also had mild plasmacytosis on previous bone marrow biopsy. She presents again today for repeat CT-guided bone marrow biopsy for further evaluation. Risks and benefits of procedure was discussed with the patient ncluding, but not limited to bleeding, infection, damage to adjacent structures or low yield requiring additional tests.  All of the questions were answered and there is agreement to proceed.  Consent signed and in chart.  Dr. Posey Pronto made aware of pt's latest creatinine level  Electronically Signed: D. Rowe Robert, PA-C 01/20/2019, 10:18 AM   I spent a total of 20 minutes  at the the patient's bedside AND on the patient's hospital floor or unit, greater than 50% of which was counseling/coordinating care for CT guided bone marrow biopsy

## 2019-01-20 NOTE — Discharge Instructions (Signed)
Moderate Conscious Sedation, Adult, Care After °These instructions provide you with information about caring for yourself after your procedure. Your health care provider may also give you more specific instructions. Your treatment has been planned according to current medical practices, but problems sometimes occur. Call your health care provider if you have any problems or questions after your procedure. °What can I expect after the procedure? °After your procedure, it is common: °· To feel sleepy for several hours. °· To feel clumsy and have poor balance for several hours. °· To have poor judgment for several hours. °· To vomit if you eat too soon. °Follow these instructions at home: °For at least 24 hours after the procedure: ° °· Do not: °? Participate in activities where you could fall or become injured. °? Drive. °? Use heavy machinery. °? Drink alcohol. °? Take sleeping pills or medicines that cause drowsiness. °? Make important decisions or sign legal documents. °? Take care of children on your own. °· Rest. °Eating and drinking °· Follow the diet recommended by your health care provider. °· If you vomit: °? Drink water, juice, or soup when you can drink without vomiting. °? Make sure you have little or no nausea before eating solid foods. °General instructions °· Have a responsible adult stay with you until you are awake and alert. °· Take over-the-counter and prescription medicines only as told by your health care provider. °· If you smoke, do not smoke without supervision. °· Keep all follow-up visits as told by your health care provider. This is important. °Contact a health care provider if: °· You keep feeling nauseous or you keep vomiting. °· You feel light-headed. °· You develop a rash. °· You have a fever. °Get help right away if: °· You have trouble breathing. °This information is not intended to replace advice given to you by your health care provider. Make sure you discuss any questions you have  with your health care provider. °Document Released: 11/30/2012 Document Revised: 01/22/2017 Document Reviewed: 06/01/2015 °Elsevier Patient Education © 2020 Elsevier Inc. ° ° °Bone Marrow Aspiration and Bone Marrow Biopsy, Adult, Care After °This sheet gives you information about how to care for yourself after your procedure. Your health care provider may also give you more specific instructions. If you have problems or questions, contact your health care provider. °What can I expect after the procedure? °After the procedure, it is common to have: °· Mild pain and tenderness. °· Swelling. °· Bruising. °Follow these instructions at home: °Puncture site care ° °  ° °· Follow instructions from your health care provider about how to take care of the puncture site. Make sure you: °? Wash your hands with soap and water before you change your bandage (dressing). If soap and water are not available, use hand sanitizer. °? Change your dressing as told by your health care provider. °· Check your puncture site every day for signs of infection. Check for: °? More redness, swelling, or pain. °? More fluid or blood. °? Warmth. °? Pus or a bad smell. °General instructions °· Take over-the-counter and prescription medicines only as told by your health care provider. °· Do not take baths, swim, or use a hot tub until your health care provider approves. Ask if you can take a shower or have a sponge bath. °· Return to your normal activities as told by your health care provider. Ask your health care provider what activities are safe for you. °· Do not drive for 24 hours if you were   given a medicine to help you relax (sedative) during your procedure. °· Keep all follow-up visits as told by your health care provider. This is important. °Contact a health care provider if: °· Your pain is not controlled with medicine. °Get help right away if: °· You have a fever. °· You have more redness, swelling, or pain around the puncture site. °· You  have more fluid or blood coming from the puncture site. °· Your puncture site feels warm to the touch. °· You have pus or a bad smell coming from the puncture site. °These symptoms may represent a serious problem that is an emergency. Do not wait to see if the symptoms will go away. Get medical help right away. Call your local emergency services (911 in the [place] Do not drive yourself to the hospital. °Summary °· After the procedure, it is common to have mild pain, tenderness, swelling, and bruising. °· Follow instructions from your health care provider about how to take care of the puncture site. °· Get help right away if you have any symptoms of infection or if you have more blood or fluid coming from the puncture site. °This information is not intended to replace advice given to you by your health care provider. Make sure you discuss any questions you have with your health care provider. °Document Released: 08/29/2004 Document Revised: 05/25/2017 Document Reviewed: 07/24/2015 °Elsevier Patient Education © 2020 Elsevier Inc. ° ° °

## 2019-01-23 ENCOUNTER — Inpatient Hospital Stay: Payer: Medicare HMO

## 2019-01-23 ENCOUNTER — Other Ambulatory Visit: Payer: Self-pay

## 2019-01-23 ENCOUNTER — Ambulatory Visit: Payer: Medicare HMO

## 2019-01-23 ENCOUNTER — Telehealth: Payer: Self-pay | Admitting: *Deleted

## 2019-01-23 ENCOUNTER — Telehealth: Payer: Self-pay

## 2019-01-23 VITALS — BP 161/66 | HR 71 | Temp 98.0°F | Resp 18

## 2019-01-23 DIAGNOSIS — Z862 Personal history of diseases of the blood and blood-forming organs and certain disorders involving the immune mechanism: Secondary | ICD-10-CM

## 2019-01-23 DIAGNOSIS — E538 Deficiency of other specified B group vitamins: Secondary | ICD-10-CM | POA: Diagnosis not present

## 2019-01-23 DIAGNOSIS — D649 Anemia, unspecified: Secondary | ICD-10-CM

## 2019-01-23 DIAGNOSIS — Z794 Long term (current) use of insulin: Secondary | ICD-10-CM | POA: Diagnosis not present

## 2019-01-23 DIAGNOSIS — E119 Type 2 diabetes mellitus without complications: Secondary | ICD-10-CM | POA: Diagnosis not present

## 2019-01-23 DIAGNOSIS — I10 Essential (primary) hypertension: Secondary | ICD-10-CM | POA: Diagnosis not present

## 2019-01-23 DIAGNOSIS — Z79899 Other long term (current) drug therapy: Secondary | ICD-10-CM | POA: Diagnosis not present

## 2019-01-23 LAB — CBC WITH DIFFERENTIAL (CANCER CENTER ONLY)
Abs Immature Granulocytes: 0.01 10*3/uL (ref 0.00–0.07)
Basophils Absolute: 0 10*3/uL (ref 0.0–0.1)
Basophils Relative: 1 %
Eosinophils Absolute: 0.2 10*3/uL (ref 0.0–0.5)
Eosinophils Relative: 4 %
HCT: 35.6 % — ABNORMAL LOW (ref 36.0–46.0)
Hemoglobin: 10.6 g/dL — ABNORMAL LOW (ref 12.0–15.0)
Immature Granulocytes: 0 %
Lymphocytes Relative: 38 %
Lymphs Abs: 1.7 10*3/uL (ref 0.7–4.0)
MCH: 28.3 pg (ref 26.0–34.0)
MCHC: 29.8 g/dL — ABNORMAL LOW (ref 30.0–36.0)
MCV: 94.9 fL (ref 80.0–100.0)
Monocytes Absolute: 0.4 10*3/uL (ref 0.1–1.0)
Monocytes Relative: 9 %
Neutro Abs: 2.1 10*3/uL (ref 1.7–7.7)
Neutrophils Relative %: 48 %
Platelet Count: 141 10*3/uL — ABNORMAL LOW (ref 150–400)
RBC: 3.75 MIL/uL — ABNORMAL LOW (ref 3.87–5.11)
RDW: 16.9 % — ABNORMAL HIGH (ref 11.5–15.5)
WBC Count: 4.4 10*3/uL (ref 4.0–10.5)
nRBC: 0 % (ref 0.0–0.2)

## 2019-01-23 MED ORDER — EPOETIN ALFA-EPBX 40000 UNIT/ML IJ SOLN
INTRAMUSCULAR | Status: AC
  Filled 2019-01-23: qty 1

## 2019-01-23 MED ORDER — EPOETIN ALFA-EPBX 40000 UNIT/ML IJ SOLN
40000.0000 [IU] | Freq: Once | INTRAMUSCULAR | Status: AC
  Administered 2019-01-23: 40000 [IU] via SUBCUTANEOUS

## 2019-01-23 NOTE — Patient Instructions (Signed)

## 2019-01-23 NOTE — Telephone Encounter (Signed)
Received VM from pt husband, attempt x1 to return call, no answer, LVM to return call to the office.

## 2019-01-23 NOTE — Telephone Encounter (Signed)
Per Dr. Lindi Adie OK to give injection with BP 161/66

## 2019-01-24 LAB — SURGICAL PATHOLOGY

## 2019-01-27 ENCOUNTER — Encounter (HOSPITAL_COMMUNITY): Payer: Self-pay | Admitting: Hematology and Oncology

## 2019-01-29 NOTE — Progress Notes (Signed)
Patient Care Team: [name], MD as PCP - General (Family Medicine) Michael Boston, MD as Consulting Physician (General Surgery) Sydnee Cabal, MD as Consulting Physician (Orthopedic Surgery)  DIAGNOSIS:    ICD-10-CM   1. Normocytic anemia  D64.9     CHIEF COMPLIANT: Follow-up of anemia to review bone marrow biopsy results  INTERVAL HISTORY: Bailey [name] is a 68 y.o. with above-mentioned history of normocytic anemia and B12 deficiency who is currently on erythropoietin stimulation therapy with Retacrit.She underwent a bone marrow biopsy on 01/20/19. She presents to the clinic todayto review the pathology. She does not feel any different.  She has recovered very well from a bone marrow biopsy.  REVIEW OF SYSTEMS:   Constitutional: Denies fevers, chills or abnormal weight loss Eyes: Denies blurriness of vision Ears, nose, mouth, throat, and face: Denies mucositis or sore throat Respiratory: Denies cough, dyspnea or wheezes Cardiovascular: Denies palpitation, chest discomfort Gastrointestinal: Denies nausea, heartburn or change in bowel habits Skin: Denies abnormal skin rashes Lymphatics: Denies new lymphadenopathy or easy bruising Neurological: Denies numbness, tingling or new weaknesses Behavioral/Psych: Mood is stable, no new changes  Extremities: No lower extremity edema Breast: denies any pain or lumps or nodules in either breasts All other systems were reviewed with the patient and are negative.  I have reviewed the past medical history, past surgical history, social history and family history with the patient and they are unchanged from previous note.  ALLERGIES:  is allergic to no known allergies.  MEDICATIONS:  Current Outpatient Medications  Medication Sig Dispense Refill  . acetaminophen (TYLENOL) 500 MG tablet Take 500 mg by mouth every 6 (six) hours as needed for moderate pain (pain).    . carvedilol (COREG) 12.5 MG tablet Take 12.5 mg by mouth 2  (two) times daily with a meal.    . ferrous sulfate 325 (65 FE) MG tablet Take 325 mg by mouth 2 (two) times daily with a meal.    . HYDROcodone-acetaminophen (NORCO/VICODIN) 5-325 MG tablet Take 1 tablet by mouth every 4 (four) hours as needed for moderate pain. 40 tablet 0  . insulin glargine (LANTUS) 100 UNIT/ML injection Inject 15 Units into the skin daily. Takes at 1300    . sodium bicarbonate 650 MG tablet Take 650 mg by mouth 2 (two) times daily.     No current facility-administered medications for this visit.     PHYSICAL EXAMINATION: ECOG PERFORMANCE STATUS: 1 - Symptomatic but completely ambulatory  Vitals:   01/30/19 1530  BP: (!) 184/80  Pulse: 70  Resp: 17  Temp: 98.5 F (36.9 C)  SpO2: 100%   Filed Weights   01/30/19 1530  Weight: 130 lb 6.4 oz (59.1 kg)    GENERAL: alert, no distress and comfortable SKIN: skin color, texture, turgor are normal, no rashes or significant lesions EYES: normal, Conjunctiva are pink and non-injected, sclera clear OROPHARYNX: no exudate, no erythema and lips, buccal mucosa, and tongue normal  NECK: supple, thyroid normal size, non-tender, without nodularity LYMPH: no palpable lymphadenopathy in the cervical, axillary or inguinal LUNGS: clear to auscultation and percussion with normal breathing effort HEART: regular rate & rhythm and no murmurs and no lower extremity edema ABDOMEN: abdomen soft, non-tender and normal bowel sounds MUSCULOSKELETAL: no cyanosis of digits and no clubbing  NEURO: alert & oriented x 3 with fluent speech, no focal motor/sensory deficits EXTREMITIES: No lower extremity edema  LABORATORY DATA:  I have reviewed the data as listed CMP Latest Ref Rng &  Units 01/20/2019 08/14/2016 05/15/2016  Glucose 70 - 99 mg/dL 116(H) 92 110(H)  BUN 8 - 23 mg/dL 58(H) 29(H) 6  Creatinine 0.44 - 1.00 mg/dL 5.40(H) 0.91 0.75  Sodium 135 - 145 mmol/L 143 139 139  Potassium 3.5 - 5.1 mmol/L 4.1 3.4(L) 3.0(L)  Chloride 98 - 111  mmol/L 117(H) 111 102  CO2 22 - 32 mmol/L 17(L) 19(L) 31  Calcium 8.9 - 10.3 mg/dL 8.5(L) 8.9 7.6(L)  Total Protein 6.5 - 8.1 g/dL - 7.1 -  Total Bilirubin 0.3 - 1.2 mg/dL - 0.6 -  Alkaline Phos 38 - 126 U/L - 75 -  AST 15 - 41 U/L - 20 -  ALT 14 - 54 U/L - 14 -    Lab Results  Component Value Date   WBC 5.5 01/30/2019   HGB 11.8 (L) 01/30/2019   HCT 39.5 01/30/2019   MCV 96.1 01/30/2019   PLT 125 (L) 01/30/2019   NEUTROABS 2.7 01/30/2019    ASSESSMENT & PLAN:  Normocytic anemia 04/25/2018: Hemoglobin 9.8, MCV 89, platelets 129 08/09/2018: Hemoglobin 9.8, MCV 90.4, platelets 142 11/16/2018:Hemoglobin 7, MCV 94.7, platelets 188 01/02/2019: Hemoglobin 8.4, MCV 91.8, platelets 93 01/30/2019: Hemoglobin 11.8, platelets 125  Bone marrow biopsy: 01/20/2019: Hypercellular bone marrow 50 to 60% for age with trilineage hematopoiesis, several small lymphoid aggregates present.  Mild dyspoietic changes involving the red cells.  No increase in blasts.  Confirmed that these changes are related to chronic kidney disease.  Flow cytometry: Negative, platelet clumping noted suggestive of pseudothrombocytopenia.  Current treatment:  1. B12 injections monthly, 2.  Retacrit injections: This will be held because her hemoglobin has improved to 11.8. I discussed with her that the cause of anemia is chronic kidney disease. We will give her Retacrit injections if her hemoglobin drops below 10. She has an appointment to see Dr. Posey Pronto with nephrology soon.  Severe uncontrolled hypertension: Still uncontrolled She was informed by nephrology that she will need to be placed on transplant list.  We will cancel her appointments on 02/06/2019.  I will see her on 02/13/2019 with labs    No orders of the defined types were placed in this encounter.  The patient has a good understanding of the overall plan. she agrees with it. she will call with any problems that may develop before the next visit here.   Nicholas Lose, MD 01/30/2019  Julious Oka Dorshimer, am acting as scribe for Dr. Nicholas Lose.  I have reviewed the above documentation for accuracy and completeness, and I agree with the above.

## 2019-01-30 ENCOUNTER — Inpatient Hospital Stay: Payer: Medicare HMO | Attending: Hematology and Oncology

## 2019-01-30 ENCOUNTER — Ambulatory Visit: Payer: Medicare HMO | Admitting: Hematology and Oncology

## 2019-01-30 ENCOUNTER — Other Ambulatory Visit: Payer: Medicare HMO

## 2019-01-30 ENCOUNTER — Inpatient Hospital Stay (HOSPITAL_BASED_OUTPATIENT_CLINIC_OR_DEPARTMENT_OTHER): Payer: Medicare HMO | Admitting: Hematology and Oncology

## 2019-01-30 ENCOUNTER — Other Ambulatory Visit: Payer: Self-pay

## 2019-01-30 ENCOUNTER — Ambulatory Visit: Payer: Medicare HMO

## 2019-01-30 ENCOUNTER — Inpatient Hospital Stay: Payer: Medicare HMO

## 2019-01-30 DIAGNOSIS — D631 Anemia in chronic kidney disease: Secondary | ICD-10-CM | POA: Insufficient documentation

## 2019-01-30 DIAGNOSIS — N189 Chronic kidney disease, unspecified: Secondary | ICD-10-CM | POA: Insufficient documentation

## 2019-01-30 DIAGNOSIS — Z794 Long term (current) use of insulin: Secondary | ICD-10-CM | POA: Diagnosis not present

## 2019-01-30 DIAGNOSIS — I129 Hypertensive chronic kidney disease with stage 1 through stage 4 chronic kidney disease, or unspecified chronic kidney disease: Secondary | ICD-10-CM | POA: Diagnosis not present

## 2019-01-30 DIAGNOSIS — D649 Anemia, unspecified: Secondary | ICD-10-CM

## 2019-01-30 DIAGNOSIS — Z79899 Other long term (current) drug therapy: Secondary | ICD-10-CM | POA: Diagnosis not present

## 2019-01-30 DIAGNOSIS — Z7682 Awaiting organ transplant status: Secondary | ICD-10-CM | POA: Diagnosis not present

## 2019-01-30 DIAGNOSIS — E538 Deficiency of other specified B group vitamins: Secondary | ICD-10-CM | POA: Diagnosis not present

## 2019-01-30 DIAGNOSIS — E119 Type 2 diabetes mellitus without complications: Secondary | ICD-10-CM | POA: Insufficient documentation

## 2019-01-30 LAB — CBC WITH DIFFERENTIAL (CANCER CENTER ONLY)
Abs Immature Granulocytes: 0.01 10*3/uL (ref 0.00–0.07)
Basophils Absolute: 0.1 10*3/uL (ref 0.0–0.1)
Basophils Relative: 1 %
Eosinophils Absolute: 0.2 10*3/uL (ref 0.0–0.5)
Eosinophils Relative: 4 %
HCT: 39.5 % (ref 36.0–46.0)
Hemoglobin: 11.8 g/dL — ABNORMAL LOW (ref 12.0–15.0)
Immature Granulocytes: 0 %
Lymphocytes Relative: 39 %
Lymphs Abs: 2.2 10*3/uL (ref 0.7–4.0)
MCH: 28.7 pg (ref 26.0–34.0)
MCHC: 29.9 g/dL — ABNORMAL LOW (ref 30.0–36.0)
MCV: 96.1 fL (ref 80.0–100.0)
Monocytes Absolute: 0.4 10*3/uL (ref 0.1–1.0)
Monocytes Relative: 7 %
Neutro Abs: 2.7 10*3/uL (ref 1.7–7.7)
Neutrophils Relative %: 49 %
Platelet Count: 125 10*3/uL — ABNORMAL LOW (ref 150–400)
RBC: 4.11 MIL/uL (ref 3.87–5.11)
RDW: 17.1 % — ABNORMAL HIGH (ref 11.5–15.5)
WBC Count: 5.5 10*3/uL (ref 4.0–10.5)
nRBC: 0 % (ref 0.0–0.2)

## 2019-01-30 NOTE — Assessment & Plan Note (Signed)
04/25/2018: Hemoglobin 9.8, MCV 89, platelets 129 08/09/2018: Hemoglobin 9.8, MCV 90.4, platelets 142 11/16/2018:Hemoglobin 7, MCV 94.7, platelets 188 01/02/2019: Hemoglobin 8.4, MCV 91.8, platelets 93  Bone marrow biopsy: 01/20/2019: Hypercellular bone marrow 50 to 60% for age with trilineage hematopoiesis, several small lymphoid aggregates present.  Mild dyspoietic changes involving the red cells.  No increase in blasts.  Confirmed that these changes are related to chronic kidney disease.  Flow cytometry: Negative, platelet clumping noted suggestive of pseudothrombocytopenia.  Current treatment:  1. B12 injections monthly, 2.  Retacrit injections weekly  Severe uncontrolled hypertension: Still uncontrolled She was informed by nephrology that she will need to be placed on transplant list.  Patient will come weekly for Retacrit injections and in 4 weeks for follow-up with me.

## 2019-01-31 ENCOUNTER — Telehealth: Payer: Self-pay | Admitting: Hematology and Oncology

## 2019-01-31 NOTE — Telephone Encounter (Signed)
I talk with patient regarding schedule  

## 2019-02-06 ENCOUNTER — Telehealth: Payer: Self-pay | Admitting: *Deleted

## 2019-02-06 ENCOUNTER — Ambulatory Visit: Payer: Medicare HMO

## 2019-02-06 ENCOUNTER — Other Ambulatory Visit: Payer: Medicare HMO

## 2019-02-06 NOTE — Telephone Encounter (Signed)
Received VM from pt husband, attempt x1 to return call, no answer, LVM to return call to the office.

## 2019-02-12 NOTE — Progress Notes (Signed)
Patient Care Team: [name], MD as PCP - General (Family Medicine) Michael Boston, MD as Consulting Physician (General Surgery) Sydnee Cabal, MD as Consulting Physician (Orthopedic Surgery)  DIAGNOSIS:    ICD-10-CM   1. Normocytic anemia  D64.9     CHIEF COMPLIANT: Follow-up of anemia and B12 deficiency   INTERVAL HISTORY: Bailey [name] is a 68 y.o. with above-mentioned history of anemia of chronic kidney disease and B12 deficiency who is currently on treatment with Retacrit.She presents to the clinic todayfor follow-up.    REVIEW OF SYSTEMS:   Constitutional: Denies fevers, chills or abnormal weight loss Eyes: Denies blurriness of vision Ears, nose, mouth, throat, and face: Denies mucositis or sore throat Respiratory: Denies cough, dyspnea or wheezes Cardiovascular: Denies palpitation, chest discomfort Gastrointestinal: Denies nausea, heartburn or change in bowel habits Skin: Denies abnormal skin rashes Lymphatics: Denies new lymphadenopathy or easy bruising Neurological: Denies numbness, tingling or new weaknesses Behavioral/Psych: Mood is stable, no new changes  Extremities: No lower extremity edema Breast: denies any pain or lumps or nodules in either breasts All other systems were reviewed with the patient and are negative.  I have reviewed the past medical history, past surgical history, social history and family history with the patient and they are unchanged from previous note.  ALLERGIES:  is allergic to no known allergies.  MEDICATIONS:  Current Outpatient Medications  Medication Sig Dispense Refill  . acetaminophen (TYLENOL) 500 MG tablet Take 500 mg by mouth every 6 (six) hours as needed for moderate pain (pain).    . carvedilol (COREG) 12.5 MG tablet Take 12.5 mg by mouth 2 (two) times daily with a meal.    . ferrous sulfate 325 (65 FE) MG tablet Take 325 mg by mouth 2 (two) times daily with a meal.    . HYDROcodone-acetaminophen (NORCO/VICODIN)  5-325 MG tablet Take 1 tablet by mouth every 4 (four) hours as needed for moderate pain. 40 tablet 0  . insulin glargine (LANTUS) 100 UNIT/ML injection Inject 15 Units into the skin daily. Takes at 1300    . sodium bicarbonate 650 MG tablet Take 650 mg by mouth 2 (two) times daily.     No current facility-administered medications for this visit.    PHYSICAL EXAMINATION: ECOG PERFORMANCE STATUS: 1 - Symptomatic but completely ambulatory  Vitals:   02/13/19 1541  BP: (!) 194/70  Pulse: 66  Resp: 17  Temp: 98.9 F (37.2 C)  SpO2: 100%   Filed Weights   02/13/19 1541  Weight: 127 lb 3.2 oz (57.7 kg)    GENERAL: alert, no distress and comfortable SKIN: skin color, texture, turgor are normal, no rashes or significant lesions EYES: normal, Conjunctiva are pink and non-injected, sclera clear OROPHARYNX: no exudate, no erythema and lips, buccal mucosa, and tongue normal  NECK: supple, thyroid normal size, non-tender, without nodularity LYMPH: no palpable lymphadenopathy in the cervical, axillary or inguinal LUNGS: clear to auscultation and percussion with normal breathing effort HEART: regular rate & rhythm and no murmurs and no lower extremity edema ABDOMEN: abdomen soft, non-tender and normal bowel sounds MUSCULOSKELETAL: no cyanosis of digits and no clubbing  NEURO: alert & oriented x 3 with fluent speech, no focal motor/sensory deficits EXTREMITIES: No lower extremity edema  LABORATORY DATA:  I have reviewed the data as listed CMP Latest Ref Rng & Units 01/20/2019 08/14/2016 05/15/2016  Glucose 70 - 99 mg/dL 116(H) 92 110(H)  BUN 8 - 23 mg/dL 58(H) 29(H) 6  Creatinine 0.44 - 1.00  mg/dL 5.40(H) 0.91 0.75  Sodium 135 - 145 mmol/L 143 139 139  Potassium 3.5 - 5.1 mmol/L 4.1 3.4(L) 3.0(L)  Chloride 98 - 111 mmol/L 117(H) 111 102  CO2 22 - 32 mmol/L 17(L) 19(L) 31  Calcium 8.9 - 10.3 mg/dL 8.5(L) 8.9 7.6(L)  Total Protein 6.5 - 8.1 g/dL - 7.1 -  Total Bilirubin 0.3 - 1.2 mg/dL -  0.6 -  Alkaline Phos 38 - 126 U/L - 75 -  AST 15 - 41 U/L - 20 -  ALT 14 - 54 U/L - 14 -    Lab Results  Component Value Date   WBC 6.9 02/13/2019   HGB 11.6 (L) 02/13/2019   HCT 38.5 02/13/2019   MCV 93.7 02/13/2019   PLT 113 (L) 02/13/2019   NEUTROABS 4.1 02/13/2019    ASSESSMENT & PLAN:  Normocytic anemia 04/25/2018: Hemoglobin 9.8, MCV 89, platelets 129 08/09/2018: Hemoglobin 9.8, MCV 90.4, platelets 142 11/16/2018:Hemoglobin 7, MCV 94.7, platelets 188 01/02/2019: Hemoglobin 8.4, MCV 91.8, platelets 93 01/30/2019: Hemoglobin 11.8, platelets 125 02/13/2019: Hemoglobin 11.6, platelets 113  Bone marrow biopsy: 01/20/2019: Hypercellular bone marrow 50 to 60% for age with trilineage hematopoiesis, several small lymphoid aggregates present.  Mild dyspoietic changes involving the red cells.  No increase in blasts.  Confirmed that these changes are related to chronic kidney disease.  Flow cytometry: Negative, platelet clumping noted suggestive of pseudothrombocytopenia.  Current treatment: 1.B12 injections monthly, 2.Retacrit injections: This will be held because her hemoglobin has improved to 11.6.  I discussed with her that the cause of anemia is chronic kidney disease. We will give her Retacrit injections if her hemoglobin drops below 10. Severe uncontrolled hypertension:Still uncontrolled.  She has appointment to see nephrology next week and she will discuss this with him. She was placed on transplant list.   No orders of the defined types were placed in this encounter.  The patient has a good understanding of the overall plan. she agrees with it. she will call with any problems that may develop before the next visit here.  Nicholas Lose, MD 02/13/2019  Julious Oka Dorshimer, am acting as scribe for Dr. Nicholas Lose.  I have reviewed the above document for accuracy and completeness, and I agree with the above.

## 2019-02-13 ENCOUNTER — Inpatient Hospital Stay (HOSPITAL_BASED_OUTPATIENT_CLINIC_OR_DEPARTMENT_OTHER): Payer: Medicare HMO | Admitting: Hematology and Oncology

## 2019-02-13 ENCOUNTER — Other Ambulatory Visit: Payer: Self-pay

## 2019-02-13 ENCOUNTER — Ambulatory Visit: Payer: Medicare HMO

## 2019-02-13 ENCOUNTER — Ambulatory Visit: Payer: Medicare HMO | Admitting: Hematology and Oncology

## 2019-02-13 ENCOUNTER — Inpatient Hospital Stay: Payer: Medicare HMO

## 2019-02-13 ENCOUNTER — Other Ambulatory Visit: Payer: Medicare HMO

## 2019-02-13 DIAGNOSIS — D649 Anemia, unspecified: Secondary | ICD-10-CM | POA: Diagnosis not present

## 2019-02-13 DIAGNOSIS — E538 Deficiency of other specified B group vitamins: Secondary | ICD-10-CM | POA: Diagnosis not present

## 2019-02-13 DIAGNOSIS — Z79899 Other long term (current) drug therapy: Secondary | ICD-10-CM | POA: Diagnosis not present

## 2019-02-13 DIAGNOSIS — Z7682 Awaiting organ transplant status: Secondary | ICD-10-CM | POA: Diagnosis not present

## 2019-02-13 DIAGNOSIS — D631 Anemia in chronic kidney disease: Secondary | ICD-10-CM | POA: Diagnosis not present

## 2019-02-13 DIAGNOSIS — Z862 Personal history of diseases of the blood and blood-forming organs and certain disorders involving the immune mechanism: Secondary | ICD-10-CM

## 2019-02-13 DIAGNOSIS — N189 Chronic kidney disease, unspecified: Secondary | ICD-10-CM

## 2019-02-13 DIAGNOSIS — I129 Hypertensive chronic kidney disease with stage 1 through stage 4 chronic kidney disease, or unspecified chronic kidney disease: Secondary | ICD-10-CM | POA: Diagnosis not present

## 2019-02-13 DIAGNOSIS — E119 Type 2 diabetes mellitus without complications: Secondary | ICD-10-CM | POA: Diagnosis not present

## 2019-02-13 DIAGNOSIS — Z794 Long term (current) use of insulin: Secondary | ICD-10-CM | POA: Diagnosis not present

## 2019-02-13 LAB — CBC WITH DIFFERENTIAL (CANCER CENTER ONLY)
Abs Immature Granulocytes: 0.02 10*3/uL (ref 0.00–0.07)
Basophils Absolute: 0.1 10*3/uL (ref 0.0–0.1)
Basophils Relative: 1 %
Eosinophils Absolute: 0.4 10*3/uL (ref 0.0–0.5)
Eosinophils Relative: 6 %
HCT: 38.5 % (ref 36.0–46.0)
Hemoglobin: 11.6 g/dL — ABNORMAL LOW (ref 12.0–15.0)
Immature Granulocytes: 0 %
Lymphocytes Relative: 27 %
Lymphs Abs: 1.9 10*3/uL (ref 0.7–4.0)
MCH: 28.2 pg (ref 26.0–34.0)
MCHC: 30.1 g/dL (ref 30.0–36.0)
MCV: 93.7 fL (ref 80.0–100.0)
Monocytes Absolute: 0.4 10*3/uL (ref 0.1–1.0)
Monocytes Relative: 6 %
Neutro Abs: 4.1 10*3/uL (ref 1.7–7.7)
Neutrophils Relative %: 60 %
Platelet Count: 113 10*3/uL — ABNORMAL LOW (ref 150–400)
RBC: 4.11 MIL/uL (ref 3.87–5.11)
RDW: 15.4 % (ref 11.5–15.5)
WBC Count: 6.9 10*3/uL (ref 4.0–10.5)
nRBC: 0 % (ref 0.0–0.2)

## 2019-02-13 MED ORDER — CYANOCOBALAMIN 1000 MCG/ML IJ SOLN
1000.0000 ug | Freq: Once | INTRAMUSCULAR | Status: AC
Start: 2019-02-13 — End: 2019-02-13
  Administered 2019-02-13: 1000 ug via INTRAMUSCULAR

## 2019-02-13 NOTE — Patient Instructions (Signed)

## 2019-02-13 NOTE — Assessment & Plan Note (Signed)
04/25/2018: Hemoglobin 9.8, MCV 89, platelets 129 08/09/2018: Hemoglobin 9.8, MCV 90.4, platelets 142 11/16/2018:Hemoglobin 7, MCV 94.7, platelets 188 01/02/2019: Hemoglobin 8.4, MCV 91.8, platelets 93 01/30/2019: Hemoglobin 11.8, platelets 125  Bone marrow biopsy: 01/20/2019: Hypercellular bone marrow 50 to 60% for age with trilineage hematopoiesis, several small lymphoid aggregates present.  Mild dyspoietic changes involving the red cells.  No increase in blasts.  Confirmed that these changes are related to chronic kidney disease.  Flow cytometry: Negative, platelet clumping noted suggestive of pseudothrombocytopenia.  Current treatment: 1.B12 injections monthly, 2.Retacrit injections: This will be held because her hemoglobin has improved to 11.8.  I discussed with her that the cause of anemia is chronic kidney disease. We will give her Retacrit injections if her hemoglobin drops below 10. Severe uncontrolled hypertension:Still uncontrolled She was informed by nephrology that she will need to be placed on transplant list.

## 2019-02-20 ENCOUNTER — Ambulatory Visit: Payer: Medicare HMO

## 2019-02-20 ENCOUNTER — Other Ambulatory Visit: Payer: Medicare HMO

## 2019-02-27 ENCOUNTER — Ambulatory Visit: Payer: Medicare HMO

## 2019-02-27 ENCOUNTER — Other Ambulatory Visit: Payer: Medicare HMO

## 2019-03-06 ENCOUNTER — Ambulatory Visit: Payer: Medicare HMO

## 2019-03-06 ENCOUNTER — Other Ambulatory Visit: Payer: Medicare HMO

## 2019-03-13 ENCOUNTER — Inpatient Hospital Stay: Payer: Medicare HMO

## 2019-03-13 ENCOUNTER — Other Ambulatory Visit: Payer: Self-pay

## 2019-03-13 ENCOUNTER — Encounter: Payer: Self-pay | Admitting: *Deleted

## 2019-03-13 ENCOUNTER — Ambulatory Visit: Payer: Medicare HMO

## 2019-03-13 ENCOUNTER — Inpatient Hospital Stay: Payer: Medicare HMO | Attending: Hematology and Oncology

## 2019-03-13 VITALS — BP 177/82

## 2019-03-13 DIAGNOSIS — N189 Chronic kidney disease, unspecified: Secondary | ICD-10-CM | POA: Insufficient documentation

## 2019-03-13 DIAGNOSIS — D631 Anemia in chronic kidney disease: Secondary | ICD-10-CM | POA: Diagnosis not present

## 2019-03-13 DIAGNOSIS — Z862 Personal history of diseases of the blood and blood-forming organs and certain disorders involving the immune mechanism: Secondary | ICD-10-CM

## 2019-03-13 DIAGNOSIS — E538 Deficiency of other specified B group vitamins: Secondary | ICD-10-CM | POA: Insufficient documentation

## 2019-03-13 DIAGNOSIS — D649 Anemia, unspecified: Secondary | ICD-10-CM

## 2019-03-13 LAB — CBC WITH DIFFERENTIAL (CANCER CENTER ONLY)
Abs Immature Granulocytes: 0.02 10*3/uL (ref 0.00–0.07)
Basophils Absolute: 0 10*3/uL (ref 0.0–0.1)
Basophils Relative: 1 %
Eosinophils Absolute: 0.4 10*3/uL (ref 0.0–0.5)
Eosinophils Relative: 7 %
HCT: 32.8 % — ABNORMAL LOW (ref 36.0–46.0)
Hemoglobin: 10.3 g/dL — ABNORMAL LOW (ref 12.0–15.0)
Immature Granulocytes: 0 %
Lymphocytes Relative: 35 %
Lymphs Abs: 2 10*3/uL (ref 0.7–4.0)
MCH: 28.3 pg (ref 26.0–34.0)
MCHC: 31.4 g/dL (ref 30.0–36.0)
MCV: 90.1 fL (ref 80.0–100.0)
Monocytes Absolute: 0.3 10*3/uL (ref 0.1–1.0)
Monocytes Relative: 6 %
Neutro Abs: 3 10*3/uL (ref 1.7–7.7)
Neutrophils Relative %: 51 %
Platelet Count: 109 10*3/uL — ABNORMAL LOW (ref 150–400)
RBC: 3.64 MIL/uL — ABNORMAL LOW (ref 3.87–5.11)
RDW: 15.3 % (ref 11.5–15.5)
WBC Count: 5.8 10*3/uL (ref 4.0–10.5)
nRBC: 0 % (ref 0.0–0.2)

## 2019-03-13 MED ORDER — EPOETIN ALFA-EPBX 40000 UNIT/ML IJ SOLN: 40000.0000 [IU] | Freq: Once | INTRAMUSCULAR | Status: DC

## 2019-03-13 NOTE — Progress Notes (Signed)
Per pharmacy patient will not give injection due to insurance protocol. The Hgb has to be less than 10 before we can get authorization.Made patient aware .

## 2019-03-13 NOTE — Patient Instructions (Signed)

## 2019-03-13 NOTE — Progress Notes (Signed)
Received message from Ulice Dash with the billing department stating that the patients insurance will not approve a new authorization for Retacrit until the patients Hgb is less than 10.  Once the patients Hgb reaches below 10, then an authorization can be submitted to the insurance and the pt will be able to receive the injection once approval is received.

## 2019-03-20 ENCOUNTER — Inpatient Hospital Stay: Payer: Medicare HMO

## 2019-03-20 ENCOUNTER — Ambulatory Visit: Payer: Medicare HMO

## 2019-03-21 ENCOUNTER — Encounter: Payer: Medicare HMO | Admitting: Vascular Surgery

## 2019-03-21 ENCOUNTER — Encounter (HOSPITAL_COMMUNITY): Payer: Medicare HMO

## 2019-03-21 ENCOUNTER — Other Ambulatory Visit (HOSPITAL_COMMUNITY): Payer: Medicare HMO

## 2019-03-26 NOTE — Progress Notes (Signed)
Patient Care Team: [name], MD as PCP - General (Family Medicine) Michael Boston, MD as Consulting Physician (General Surgery) Sydnee Cabal, MD as Consulting Physician (Orthopedic Surgery)  DIAGNOSIS:    ICD-10-CM   1. Vitamin B 12 deficiency  E53.8     CHIEF COMPLIANT: Follow-up of anemiaand B12 deficiency  INTERVAL HISTORY: Bailey [name] is a 69 y.o. with above-mentioned history of anemia of chronic kidney disease and B12 deficiency who is currently on treatment with Retacrit.She presents to the clinic todayfor follow-up.  She reports increased fatigue.  Her hemoglobin has declined from 11.4-10 today.  Unfortunately her insurance would not authorize Retacrit injections if the hemoglobin is 10 or above.  Therefore we are not going to do her injection for Retacrit today.  ALLERGIES:  is allergic to no known allergies.  MEDICATIONS:  Current Outpatient Medications  Medication Sig Dispense Refill  . acetaminophen (TYLENOL) 500 MG tablet Take 500 mg by mouth every 6 (six) hours as needed for moderate pain (pain).    . carvedilol (COREG) 12.5 MG tablet Take 12.5 mg by mouth 2 (two) times daily with a meal.    . ferrous sulfate 325 (65 FE) MG tablet Take 325 mg by mouth 2 (two) times daily with a meal.    . HYDROcodone-acetaminophen (NORCO/VICODIN) 5-325 MG tablet Take 1 tablet by mouth every 4 (four) hours as needed for moderate pain. 40 tablet 0  . insulin glargine (LANTUS) 100 UNIT/ML injection Inject 15 Units into the skin daily. Takes at 1300    . sodium bicarbonate 650 MG tablet Take 650 mg by mouth 2 (two) times daily.     No current facility-administered medications for this visit.    PHYSICAL EXAMINATION: ECOG PERFORMANCE STATUS: 1 - Symptomatic but completely ambulatory  Vitals:   03/27/19 1519  BP: (!) 180/75  Pulse: 65  Resp: 18   Filed Weights   03/27/19 1519  Weight: 122 lb 6.4 oz (55.5 kg)    LABORATORY DATA:  I have reviewed the data as  listed CMP Latest Ref Rng & Units 01/20/2019 08/14/2016 05/15/2016  Glucose 70 - 99 mg/dL 116(H) 92 110(H)  BUN 8 - 23 mg/dL 58(H) 29(H) 6  Creatinine 0.44 - 1.00 mg/dL 5.40(H) 0.91 0.75  Sodium 135 - 145 mmol/L 143 139 139  Potassium 3.5 - 5.1 mmol/L 4.1 3.4(L) 3.0(L)  Chloride 98 - 111 mmol/L 117(H) 111 102  CO2 22 - 32 mmol/L 17(L) 19(L) 31  Calcium 8.9 - 10.3 mg/dL 8.5(L) 8.9 7.6(L)  Total Protein 6.5 - 8.1 g/dL - 7.1 -  Total Bilirubin 0.3 - 1.2 mg/dL - 0.6 -  Alkaline Phos 38 - 126 U/L - 75 -  AST 15 - 41 U/L - 20 -  ALT 14 - 54 U/L - 14 -    Lab Results  Component Value Date   WBC 5.8 03/27/2019   HGB 10.0 (L) 03/27/2019   HCT 31.7 (L) 03/27/2019   MCV 91.4 03/27/2019   PLT 95 (L) 03/27/2019   NEUTROABS 3.0 03/27/2019    ASSESSMENT & PLAN:  Vitamin B 12 deficiency 04/25/2018: Hemoglobin 9.8, MCV 89, platelets 129 08/09/2018: Hemoglobin 9.8, MCV 90.4, platelets 142 11/16/2018:Hemoglobin 7, MCV 94.7, platelets 188 01/02/2019: Hemoglobin 8.4, MCV 91.8, platelets 93 01/30/2019: Hemoglobin 11.8, platelets 125 02/13/2019: Hemoglobin 11.6, platelets 113  Bone marrow biopsy: 01/20/2019: Hypercellular bone marrow 50 to 60% for age with trilineage hematopoiesis, several small lymphoid aggregates present. Mild dyspoietic changes involving the red cells. No  increase in blasts. Confirmed that these changes are related to chronic kidney disease. Flow cytometry: Negative, platelet clumping noted suggestive of pseudothrombocytopenia.  Current treatment: 1.B12 injections monthly, 2.Retacrit injections: Today's hemoglobin is 10 and therefore we will hold off on Retacrit.  Retacrit injections if her hemoglobin drops below 10. Severe uncontrolled hypertension:Still uncontrolled.  Follows with nephrology.  She is on the transplant list. She will receive B12 injection today. Return to clinic in 1 month with labs and follow-up.  No orders of the defined types were placed in this  encounter.  The patient has a good understanding of the overall plan. she agrees with it. she will call with any problems that may develop before the next visit here.  Total time spent: 30 mins including face to face time and time spent for planning, charting and coordination of care  Nicholas Lose, MD 03/27/2019  I, Cloyde Reams Dorshimer, am acting as scribe for Dr. Nicholas Lose.  I have reviewed the above documentation for accuracy and completeness, and I agree with the above.

## 2019-03-27 ENCOUNTER — Ambulatory Visit: Payer: Medicare HMO

## 2019-03-27 ENCOUNTER — Other Ambulatory Visit: Payer: Self-pay

## 2019-03-27 ENCOUNTER — Inpatient Hospital Stay (HOSPITAL_BASED_OUTPATIENT_CLINIC_OR_DEPARTMENT_OTHER): Payer: Medicare HMO | Admitting: Hematology and Oncology

## 2019-03-27 ENCOUNTER — Other Ambulatory Visit: Payer: Medicare HMO

## 2019-03-27 ENCOUNTER — Inpatient Hospital Stay: Payer: Medicare HMO

## 2019-03-27 ENCOUNTER — Inpatient Hospital Stay: Payer: Medicare HMO | Attending: Hematology and Oncology

## 2019-03-27 DIAGNOSIS — Z79899 Other long term (current) drug therapy: Secondary | ICD-10-CM | POA: Insufficient documentation

## 2019-03-27 DIAGNOSIS — Z862 Personal history of diseases of the blood and blood-forming organs and certain disorders involving the immune mechanism: Secondary | ICD-10-CM

## 2019-03-27 DIAGNOSIS — Z794 Long term (current) use of insulin: Secondary | ICD-10-CM | POA: Insufficient documentation

## 2019-03-27 DIAGNOSIS — E538 Deficiency of other specified B group vitamins: Secondary | ICD-10-CM

## 2019-03-27 DIAGNOSIS — Z7682 Awaiting organ transplant status: Secondary | ICD-10-CM | POA: Insufficient documentation

## 2019-03-27 DIAGNOSIS — D631 Anemia in chronic kidney disease: Secondary | ICD-10-CM | POA: Diagnosis not present

## 2019-03-27 DIAGNOSIS — N189 Chronic kidney disease, unspecified: Secondary | ICD-10-CM | POA: Diagnosis not present

## 2019-03-27 DIAGNOSIS — D649 Anemia, unspecified: Secondary | ICD-10-CM

## 2019-03-27 LAB — CBC WITH DIFFERENTIAL (CANCER CENTER ONLY)
Abs Immature Granulocytes: 0.01 10*3/uL (ref 0.00–0.07)
Basophils Absolute: 0.1 10*3/uL (ref 0.0–0.1)
Basophils Relative: 1 %
Eosinophils Absolute: 0.3 10*3/uL (ref 0.0–0.5)
Eosinophils Relative: 6 %
HCT: 31.7 % — ABNORMAL LOW (ref 36.0–46.0)
Hemoglobin: 10 g/dL — ABNORMAL LOW (ref 12.0–15.0)
Immature Granulocytes: 0 %
Lymphocytes Relative: 35 %
Lymphs Abs: 2 10*3/uL (ref 0.7–4.0)
MCH: 28.8 pg (ref 26.0–34.0)
MCHC: 31.5 g/dL (ref 30.0–36.0)
MCV: 91.4 fL (ref 80.0–100.0)
Monocytes Absolute: 0.4 10*3/uL (ref 0.1–1.0)
Monocytes Relative: 7 %
Neutro Abs: 3 10*3/uL (ref 1.7–7.7)
Neutrophils Relative %: 51 %
Platelet Count: 95 10*3/uL — ABNORMAL LOW (ref 150–400)
RBC: 3.47 MIL/uL — ABNORMAL LOW (ref 3.87–5.11)
RDW: 15.4 % (ref 11.5–15.5)
WBC Count: 5.8 10*3/uL (ref 4.0–10.5)
nRBC: 0 % (ref 0.0–0.2)

## 2019-03-27 MED ORDER — CYANOCOBALAMIN 1000 MCG/ML IJ SOLN
INTRAMUSCULAR | Status: AC
  Filled 2019-03-27: qty 1

## 2019-03-27 MED ORDER — CYANOCOBALAMIN 1000 MCG/ML IJ SOLN
1000.0000 ug | Freq: Once | INTRAMUSCULAR | Status: AC
  Administered 2019-03-27: 1000 ug via INTRAMUSCULAR

## 2019-03-27 NOTE — Patient Instructions (Signed)

## 2019-03-27 NOTE — Assessment & Plan Note (Signed)
04/25/2018: Hemoglobin 9.8, MCV 89, platelets 129 08/09/2018: Hemoglobin 9.8, MCV 90.4, platelets 142 11/16/2018:Hemoglobin 7, MCV 94.7, platelets 188 01/02/2019: Hemoglobin 8.4, MCV 91.8, platelets 93 01/30/2019: Hemoglobin 11.8, platelets 125 02/13/2019: Hemoglobin 11.6, platelets 113  Bone marrow biopsy: 01/20/2019: Hypercellular bone marrow 50 to 60% for age with trilineage hematopoiesis, several small lymphoid aggregates present. Mild dyspoietic changes involving the red cells. No increase in blasts. Confirmed that these changes are related to chronic kidney disease. Flow cytometry: Negative, platelet clumping noted suggestive of pseudothrombocytopenia.  Current treatment: 1.B12 injections monthly, 2.Retacrit injections: This will be held because her hemoglobin has improved to 11.6.  Retacrit injections if her hemoglobin drops below 10. Severe uncontrolled hypertension:Still uncontrolled.  Follows with nephrology.  She is on the transplant list.

## 2019-03-28 ENCOUNTER — Telehealth: Payer: Self-pay | Admitting: Hematology and Oncology

## 2019-03-28 NOTE — Telephone Encounter (Signed)
I left a message regarding schedule  

## 2019-04-11 ENCOUNTER — Other Ambulatory Visit: Payer: Self-pay | Admitting: Internal Medicine

## 2019-04-11 DIAGNOSIS — R109 Unspecified abdominal pain: Secondary | ICD-10-CM

## 2019-04-12 ENCOUNTER — Ambulatory Visit
Admission: RE | Admit: 2019-04-12 | Discharge: 2019-04-12 | Disposition: A | Discharge status: Home / Self Care | Payer: Medicare HMO | Source: Ambulatory Visit | Attending: Internal Medicine | Admitting: Internal Medicine

## 2019-04-12 DIAGNOSIS — I10 Essential (primary) hypertension: Secondary | ICD-10-CM | POA: Diagnosis not present

## 2019-04-12 DIAGNOSIS — R109 Unspecified abdominal pain: Secondary | ICD-10-CM

## 2019-04-12 DIAGNOSIS — F411 Generalized anxiety disorder: Secondary | ICD-10-CM | POA: Diagnosis not present

## 2019-04-12 DIAGNOSIS — E1142 Type 2 diabetes mellitus with diabetic polyneuropathy: Secondary | ICD-10-CM | POA: Diagnosis not present

## 2019-04-12 DIAGNOSIS — E1122 Type 2 diabetes mellitus with diabetic chronic kidney disease: Secondary | ICD-10-CM | POA: Diagnosis not present

## 2019-04-12 DIAGNOSIS — N189 Chronic kidney disease, unspecified: Secondary | ICD-10-CM | POA: Diagnosis not present

## 2019-04-12 DIAGNOSIS — N184 Chronic kidney disease, stage 4 (severe): Secondary | ICD-10-CM | POA: Diagnosis not present

## 2019-04-13 ENCOUNTER — Other Ambulatory Visit: Payer: Medicare HMO

## 2019-04-17 DIAGNOSIS — D631 Anemia in chronic kidney disease: Secondary | ICD-10-CM | POA: Diagnosis not present

## 2019-04-17 DIAGNOSIS — I120 Hypertensive chronic kidney disease with stage 5 chronic kidney disease or end stage renal disease: Secondary | ICD-10-CM | POA: Diagnosis not present

## 2019-04-17 DIAGNOSIS — N185 Chronic kidney disease, stage 5: Secondary | ICD-10-CM | POA: Diagnosis not present

## 2019-04-17 DIAGNOSIS — N2581 Secondary hyperparathyroidism of renal origin: Secondary | ICD-10-CM | POA: Diagnosis not present

## 2019-04-18 ENCOUNTER — Other Ambulatory Visit (HOSPITAL_COMMUNITY): Payer: Medicare HMO

## 2019-04-18 ENCOUNTER — Encounter: Payer: Medicare HMO | Admitting: Vascular Surgery

## 2019-04-18 ENCOUNTER — Encounter (HOSPITAL_COMMUNITY): Payer: Medicare HMO

## 2019-04-24 NOTE — Progress Notes (Signed)
Patient Care Team: [name], MD as PCP - General (Family Medicine) Michael Boston, MD as Consulting Physician (General Surgery) Sydnee Cabal, MD as Consulting Physician (Orthopedic Surgery)  DIAGNOSIS:    ICD-10-CM   1. History of anemia due to chronic kidney disease  N18.9    Z86.2      CHIEF COMPLIANT: Follow-up of anemiaand B12 deficiency  INTERVAL HISTORY: Bailey [name] is a 69 y.o. with above-mentioned history of anemiaof chronic kidney diseaseand B12 deficiency who is currently ontreatmentwith Retacrit.She presents to the clinic todayfor follow-up.   She informed me that her grandson tried to commit suicide.  She is heading over after the injection to see him in [place].  ALLERGIES:  is allergic to no known allergies.  MEDICATIONS:  Current Outpatient Medications  Medication Sig Dispense Refill  . acetaminophen (TYLENOL) 500 MG tablet Take 500 mg by mouth every 6 (six) hours as needed for moderate pain (pain).    . carvedilol (COREG) 12.5 MG tablet Take 12.5 mg by mouth 2 (two) times daily with a meal.    . ferrous sulfate 325 (65 FE) MG tablet Take 325 mg by mouth 2 (two) times daily with a meal.    . HYDROcodone-acetaminophen (NORCO/VICODIN) 5-325 MG tablet Take 1 tablet by mouth every 4 (four) hours as needed for moderate pain. 40 tablet 0  . insulin glargine (LANTUS) 100 UNIT/ML injection Inject 15 Units into the skin daily. Takes at 1300    . sodium bicarbonate 650 MG tablet Take 650 mg by mouth 2 (two) times daily.     No current facility-administered medications for this visit.    PHYSICAL EXAMINATION: ECOG PERFORMANCE STATUS: 1 - Symptomatic but completely ambulatory  Vitals:   04/25/19 1532  BP: (!) 156/80  Pulse: 61  Resp: 16  Temp: 98.3 F (36.8 C)  SpO2: 100%   Filed Weights   04/25/19 1532  Weight: 121 lb 11.2 oz (55.2 kg)    LABORATORY DATA:  I have reviewed the data as listed CMP Latest Ref Rng & Units 01/20/2019  08/14/2016 05/15/2016  Glucose 70 - 99 mg/dL 116(H) 92 110(H)  BUN 8 - 23 mg/dL 58(H) 29(H) 6  Creatinine 0.44 - 1.00 mg/dL 5.40(H) 0.91 0.75  Sodium 135 - 145 mmol/L 143 139 139  Potassium 3.5 - 5.1 mmol/L 4.1 3.4(L) 3.0(L)  Chloride 98 - 111 mmol/L 117(H) 111 102  CO2 22 - 32 mmol/L 17(L) 19(L) 31  Calcium 8.9 - 10.3 mg/dL 8.5(L) 8.9 7.6(L)  Total Protein 6.5 - 8.1 g/dL - 7.1 -  Total Bilirubin 0.3 - 1.2 mg/dL - 0.6 -  Alkaline Phos 38 - 126 U/L - 75 -  AST 15 - 41 U/L - 20 -  ALT 14 - 54 U/L - 14 -    Lab Results  Component Value Date   WBC 5.2 04/25/2019   HGB 9.4 (L) 04/25/2019   HCT 30.2 (L) 04/25/2019   MCV 92.6 04/25/2019   PLT 112 (L) 04/25/2019   NEUTROABS 2.6 04/25/2019    ASSESSMENT & PLAN:  History of anemia due to chronic kidney disease 04/25/2018: Hemoglobin 9.8, MCV 89, platelets 129 08/09/2018: Hemoglobin 9.8, MCV 90.4, platelets 142 11/16/2018:Hemoglobin 7, MCV 94.7, platelets 188 01/02/2019: Hemoglobin 8.4, MCV 91.8, platelets 93 01/30/2019: Hemoglobin 11.8, platelets 125 02/13/2019: Hemoglobin 11.6, platelets 113  Bone marrow biopsy: 01/20/2019: Hypercellular bone marrow 50 to 60% for age with trilineage hematopoiesis, several small lymphoid aggregates present. Mild dyspoietic changes involving the red  cells. No increase in blasts. Confirmed that these changes are related to chronic kidney disease. Flow cytometry: Negative, platelet clumping noted suggestive of pseudothrombocytopenia.  Current treatment: 1.B12 injections monthly, 2.Retacrit injections: Today's hemoglobin is 9.4 therefore we will administer Retacrit today  Retacrit injections if her hemoglobin drops below 10. Severe uncontrolled hypertension:Still uncontrolled.  Follows with nephrology.  She is on the transplant list. Patient's grandson has been [facility] at [facility] with suicidal attempt. She will receive B12 injection today. Return to clinic in 1 month with labs and  follow-up.    No orders of the defined types were placed in this encounter.  The patient has a good understanding of the overall plan. she agrees with it. she will call with any problems that may develop before the next visit here.  Total time spent: 20 mins including face to face time and time spent for planning, charting and coordination of care  Nicholas Lose, MD 04/25/2019  I, Cloyde Reams Dorshimer, am acting as scribe for Dr. Nicholas Lose.  I have reviewed the above documentation for accuracy and completeness, and I agree with the above.

## 2019-04-25 ENCOUNTER — Other Ambulatory Visit: Payer: Self-pay

## 2019-04-25 ENCOUNTER — Inpatient Hospital Stay: Payer: Medicare HMO | Attending: Hematology and Oncology

## 2019-04-25 ENCOUNTER — Other Ambulatory Visit: Payer: Self-pay | Admitting: Hematology and Oncology

## 2019-04-25 ENCOUNTER — Inpatient Hospital Stay: Payer: Medicare HMO

## 2019-04-25 ENCOUNTER — Inpatient Hospital Stay (HOSPITAL_BASED_OUTPATIENT_CLINIC_OR_DEPARTMENT_OTHER): Payer: Medicare HMO | Admitting: Hematology and Oncology

## 2019-04-25 DIAGNOSIS — Z79899 Other long term (current) drug therapy: Secondary | ICD-10-CM | POA: Insufficient documentation

## 2019-04-25 DIAGNOSIS — N189 Chronic kidney disease, unspecified: Secondary | ICD-10-CM | POA: Insufficient documentation

## 2019-04-25 DIAGNOSIS — Z7682 Awaiting organ transplant status: Secondary | ICD-10-CM | POA: Diagnosis not present

## 2019-04-25 DIAGNOSIS — D649 Anemia, unspecified: Secondary | ICD-10-CM

## 2019-04-25 DIAGNOSIS — E538 Deficiency of other specified B group vitamins: Secondary | ICD-10-CM | POA: Diagnosis not present

## 2019-04-25 DIAGNOSIS — Z794 Long term (current) use of insulin: Secondary | ICD-10-CM | POA: Insufficient documentation

## 2019-04-25 DIAGNOSIS — D631 Anemia in chronic kidney disease: Secondary | ICD-10-CM | POA: Insufficient documentation

## 2019-04-25 DIAGNOSIS — I129 Hypertensive chronic kidney disease with stage 1 through stage 4 chronic kidney disease, or unspecified chronic kidney disease: Secondary | ICD-10-CM | POA: Diagnosis not present

## 2019-04-25 DIAGNOSIS — E119 Type 2 diabetes mellitus without complications: Secondary | ICD-10-CM | POA: Insufficient documentation

## 2019-04-25 DIAGNOSIS — Z862 Personal history of diseases of the blood and blood-forming organs and certain disorders involving the immune mechanism: Secondary | ICD-10-CM | POA: Diagnosis not present

## 2019-04-25 LAB — CBC WITH DIFFERENTIAL (CANCER CENTER ONLY)
Abs Immature Granulocytes: 0.01 10*3/uL (ref 0.00–0.07)
Basophils Absolute: 0.1 10*3/uL (ref 0.0–0.1)
Basophils Relative: 1 %
Eosinophils Absolute: 0.3 10*3/uL (ref 0.0–0.5)
Eosinophils Relative: 5 %
HCT: 30.2 % — ABNORMAL LOW (ref 36.0–46.0)
Hemoglobin: 9.4 g/dL — ABNORMAL LOW (ref 12.0–15.0)
Immature Granulocytes: 0 %
Lymphocytes Relative: 37 %
Lymphs Abs: 1.9 10*3/uL (ref 0.7–4.0)
MCH: 28.8 pg (ref 26.0–34.0)
MCHC: 31.1 g/dL (ref 30.0–36.0)
MCV: 92.6 fL (ref 80.0–100.0)
Monocytes Absolute: 0.3 10*3/uL (ref 0.1–1.0)
Monocytes Relative: 6 %
Neutro Abs: 2.6 10*3/uL (ref 1.7–7.7)
Neutrophils Relative %: 51 %
Platelet Count: 112 10*3/uL — ABNORMAL LOW (ref 150–400)
RBC: 3.26 MIL/uL — ABNORMAL LOW (ref 3.87–5.11)
RDW: 15.4 % (ref 11.5–15.5)
WBC Count: 5.2 10*3/uL (ref 4.0–10.5)
nRBC: 0 % (ref 0.0–0.2)

## 2019-04-25 MED ORDER — EPOETIN ALFA-EPBX 40000 UNIT/ML IJ SOLN
40000.0000 [IU] | Freq: Once | INTRAMUSCULAR | Status: AC
  Administered 2019-04-25: 40000 [IU] via SUBCUTANEOUS

## 2019-04-25 MED ORDER — EPOETIN ALFA-EPBX 40000 UNIT/ML IJ SOLN
INTRAMUSCULAR | Status: AC
  Filled 2019-04-25: qty 1

## 2019-04-25 NOTE — Patient Instructions (Addendum)

## 2019-04-25 NOTE — Assessment & Plan Note (Signed)
04/25/2018: Hemoglobin 9.8, MCV 89, platelets 129 08/09/2018: Hemoglobin 9.8, MCV 90.4, platelets 142 11/16/2018:Hemoglobin 7, MCV 94.7, platelets 188 01/02/2019: Hemoglobin 8.4, MCV 91.8, platelets 93 01/30/2019: Hemoglobin 11.8, platelets 125 02/13/2019: Hemoglobin 11.6, platelets 113  Bone marrow biopsy: 01/20/2019: Hypercellular bone marrow 50 to 60% for age with trilineage hematopoiesis, several small lymphoid aggregates present. Mild dyspoietic changes involving the red cells. No increase in blasts. Confirmed that these changes are related to chronic kidney disease. Flow cytometry: Negative, platelet clumping noted suggestive of pseudothrombocytopenia.  Current treatment: 1.B12 injections monthly, 2.Retacrit injections: Today's hemoglobin is 10 and therefore we will hold off on Retacrit.  Retacrit injections if her hemoglobin drops below 10. Severe uncontrolled hypertension:Still uncontrolled.  Follows with nephrology.  She is on the transplant list.  She will receive B12 injection today. Return to clinic in 1 month with labs and follow-up.

## 2019-04-26 ENCOUNTER — Telehealth: Payer: Self-pay | Admitting: Adult Health

## 2019-04-26 NOTE — Telephone Encounter (Signed)
I talk with patient regarding schedule  

## 2019-05-22 ENCOUNTER — Other Ambulatory Visit: Payer: Self-pay | Admitting: *Deleted

## 2019-05-22 DIAGNOSIS — N179 Acute kidney failure, unspecified: Secondary | ICD-10-CM

## 2019-05-23 ENCOUNTER — Encounter: Payer: Self-pay | Admitting: Vascular Surgery

## 2019-05-23 ENCOUNTER — Ambulatory Visit (INDEPENDENT_AMBULATORY_CARE_PROVIDER_SITE_OTHER)
Admission: RE | Admit: 2019-05-23 | Discharge: 2019-05-23 | Disposition: A | Discharge status: Home / Self Care | Payer: Medicare HMO | Source: Ambulatory Visit | Attending: Vascular Surgery | Admitting: Vascular Surgery

## 2019-05-23 ENCOUNTER — Ambulatory Visit: Payer: Medicare HMO

## 2019-05-23 ENCOUNTER — Ambulatory Visit: Payer: Medicare HMO | Admitting: Adult Health

## 2019-05-23 ENCOUNTER — Other Ambulatory Visit: Payer: Self-pay

## 2019-05-23 ENCOUNTER — Ambulatory Visit: Payer: Medicare HMO | Admitting: Vascular Surgery

## 2019-05-23 ENCOUNTER — Ambulatory Visit (HOSPITAL_COMMUNITY)
Admission: RE | Admit: 2019-05-23 | Discharge: 2019-05-23 | Disposition: A | Discharge status: Home / Self Care | Payer: Medicare HMO | Source: Ambulatory Visit | Attending: Vascular Surgery | Admitting: Vascular Surgery

## 2019-05-23 ENCOUNTER — Other Ambulatory Visit: Payer: Medicare HMO

## 2019-05-23 DIAGNOSIS — N184 Chronic kidney disease, stage 4 (severe): Secondary | ICD-10-CM | POA: Diagnosis not present

## 2019-05-23 DIAGNOSIS — N185 Chronic kidney disease, stage 5: Secondary | ICD-10-CM

## 2019-05-23 DIAGNOSIS — E1122 Type 2 diabetes mellitus with diabetic chronic kidney disease: Secondary | ICD-10-CM

## 2019-05-23 DIAGNOSIS — N179 Acute kidney failure, unspecified: Secondary | ICD-10-CM | POA: Insufficient documentation

## 2019-05-23 DIAGNOSIS — N189 Chronic kidney disease, unspecified: Secondary | ICD-10-CM | POA: Insufficient documentation

## 2019-05-23 NOTE — Progress Notes (Signed)
Patient name: Bailey [name] MRN: [id number] DOB: 30-Mar-1950 Sex: Bailey  REASON FOR CONSULT: Evaluate for permanent AV fistula access  HPI: Bailey [name] is a 69 y.o. Bailey, with history of diabetes, HTN, HLD, anemia of chronic disease, as well as stage V CKD that presents for evaluation of permanent AV fistula access.  Patient states she has not started dialysis at this time.  She is right-handed.  She is no longer working.  She denies any previous upper extremity access or fistulas or grafts in the past.  No chest wall implants.  No catheter at this time.  States she did go to class on kidney disease and discussed transplant.  Past Medical History:  Diagnosis Date  . Chronic kidney disease 05/11/2016   ARF-- dehydration  resolved by  discharged  . Diabetes mellitus    Type II  . Diabetic retinopathy (HCC)   . Diverticulitis   . History of kidney stones    passed- 6  . Osteomyelitis (HCC)   . Vitamin B 12 deficiency 06/09/2017    Past Surgical History:  Procedure Laterality Date  . AMPUTATION Right 08/14/2016   Procedure: RIGHT 1ST RAY AMPUTATION MID-SHAFT;  Surgeon: Nadara Mustard, MD;  Location: [facility] OR;  Service: Orthopedics;  Laterality: Right;  . INCISION AND DRAINAGE ABSCESS Left 02/11/2014   Procedure: INCISION AND DRAINAGE ABSCESS Left Buttock;  Surgeon: Avel Peace, MD;  Location: [facility] ORS;  Service: General;  Laterality: Left;  . LAPAROSCOPIC SMALL BOWEL RESECTION N/A 05/09/2016   Procedure: LAPAROSCOPIC SMALL BOWEL RESECTION;  Surgeon: Karie Soda, MD;  Location: [facility];  Service: General;  Laterality: N/A;  . LAPAROSCOPY N/A 05/09/2016   Procedure: LAPAROSCOPY DIAGNOSTIC, LYSIS OF ADHESIONS, SMALL BOWEL RESECTION X 2;  Surgeon: Karie Soda, MD;  Location: [facility];  Service: General;  Laterality: N/A;  . TOE AMPUTATION     left foot great toe    Family History  Problem Relation Age of Onset  . Diabetes Mother   . Asthma Sister     SOCIAL HISTORY: Social  History   Socioeconomic History  . Marital status: Married    Spouse name: Not on file  . Number of children: Not on file  . Years of education: Not on file  . Highest education level: Not on file  Occupational History  . Not on file  Tobacco Use  . Smoking status: Never Smoker  . Smokeless tobacco: Never Used  Substance and Sexual Activity  . Alcohol use: No    Alcohol/week: 0.0 standard drinks  . Drug use: No  . Sexual activity: Not on file  Other Topics Concern  . Not on file  Social History Narrative  . Not on file   Social Determinants of Health   Financial Resource Strain:   . Difficulty of Paying Living Expenses:   Food Insecurity:   . Worried About Programme researcher, broadcasting/film/video in the Last Year:   . Barista in the Last Year:   Transportation Needs:   . Freight forwarder (Medical):   Marland Kitchen Lack of Transportation (Non-Medical):   Physical Activity:   . Days of Exercise per Week:   . Minutes of Exercise per Session:   Stress:   . Feeling of Stress :   Social Connections:   . Frequency of Communication with Friends and Family:   . Frequency of Social Gatherings with Friends and Family:   . Attends Religious Services:   . Active Member of  Clubs or Organizations:   . Attends Archivist Meetings:   Marland Kitchen Marital Status:   Intimate Partner Violence:   . Fear of Current or Ex-Partner:   . Emotionally Abused:   Marland Kitchen Physically Abused:   . Sexually Abused:     Allergies  Allergen Reactions  . No Known Allergies     Current Outpatient Medications  Medication Sig Dispense Refill  . acetaminophen (TYLENOL) 500 MG tablet Take 500 mg by mouth every 6 (six) hours as needed for moderate pain (pain).    . carvedilol (COREG) 12.5 MG tablet Take 12.5 mg by mouth 2 (two) times daily with a meal.    . insulin glargine (LANTUS) 100 UNIT/ML injection Inject 15 Units into the skin daily. Takes at 1300    . sodium bicarbonate 650 MG tablet Take 650 mg by mouth 2 (two)  times daily.    . ferrous sulfate 325 (65 FE) MG tablet Take 325 mg by mouth 2 (two) times daily with a meal.    . HYDROcodone-acetaminophen (NORCO/VICODIN) 5-325 MG tablet Take 1 tablet by mouth every 4 (four) hours as needed for moderate pain. (Patient not taking: Reported on 05/23/2019) 40 tablet 0   No current facility-administered medications for this visit.    REVIEW OF SYSTEMS:  $RemoveB'[X]'inmozoOu$  denotes positive finding, $RemoveBeforeDEI'[ ]'lCxPzQICKJyVADPe$  denotes negative finding Cardiac  Comments:  Chest pain or chest pressure:    Shortness of breath upon exertion:    Short of breath when lying flat:    Irregular heart rhythm:        Vascular    Pain in calf, thigh, or hip brought on by ambulation:    Pain in feet at night that wakes you up from your sleep:     Blood clot in your veins:    Leg swelling:         Pulmonary    Oxygen at home:    Productive cough:     Wheezing:         Neurologic    Sudden weakness in arms or legs:     Sudden numbness in arms or legs:     Sudden onset of difficulty speaking or slurred speech:    Temporary loss of vision in one eye:     Problems with dizziness:         Gastrointestinal    Blood in stool:     Vomited blood:         Genitourinary    Burning when urinating:     Blood in urine:        Psychiatric    Major depression:         Hematologic    Bleeding problems:    Problems with blood clotting too easily:        Skin    Rashes or ulcers:        Constitutional    Fever or chills:      PHYSICAL EXAM: Vitals:   05/23/19 1215  BP: (!) 166/82  Pulse: 64  Resp: 14  Temp: (!) 97.4 F (36.3 C)  TempSrc: Temporal  SpO2: 99%  Weight: 120 lb (54.4 kg)  Height: $Remove'5\' 4"'jvpEzVw$  (1.626 m)    GENERAL: The patient is a well-nourished Bailey, in no acute distress. The vital signs are documented above. CARDIAC: There is a regular rate and rhythm.  VASCULAR:  Palpable radial and brachial pulse in bilateral upper extremities PULMONARY: There is good air exchange  bilaterally without wheezing or  rales. ABDOMEN: Soft and non-tender with normal pitched bowel sounds.  MUSCULOSKELETAL: There are no major deformities or cyanosis. NEUROLOGIC: No focal weakness or paresthesias are detected. SKIN: There are no ulcers or rashes noted. PSYCHIATRIC: The patient has a normal affect.  DATA:   Upper extremity arterial duplex shows biphasic waveforms bilaterally except for the right ulnar artery that has monophasic waveform at the wrist.  Upper extremity vein mapping shows very small surface veins bilaterally but she has a marginal basilic vein in the left arm.   Assessment/Plan:  69 year old Bailey with stage V CKD and diabetes that presents for evaluation of permanent dialysis access.  I discussed indications and steps of dialysis access in detail with the patient including plan for nondominant arm access which would be her left arm.  She has very small surface veins bilaterally but there is a marginal basilic vein on the left that would be potentially usable.  I discussed typically placed basilic vein based fistulas in two stages.  I discussed risks and benefits including bleeding infection steal syndrome failure to mature etc.  Ultimately she would like to discuss with her husband and also has an appoint with Dr. Posey Pronto her nephrologist and would like to further discuss with him.  She will call back to schedule and I provided her with our card with information for scheduling in our clinic.   Marty Heck, MD [facility] Office: [phone]

## 2019-05-24 ENCOUNTER — Other Ambulatory Visit: Payer: Self-pay | Admitting: *Deleted

## 2019-05-24 DIAGNOSIS — Z862 Personal history of diseases of the blood and blood-forming organs and certain disorders involving the immune mechanism: Secondary | ICD-10-CM

## 2019-05-25 ENCOUNTER — Inpatient Hospital Stay: Payer: Medicare HMO | Attending: Hematology and Oncology

## 2019-05-25 ENCOUNTER — Ambulatory Visit: Payer: Medicare HMO

## 2019-05-25 ENCOUNTER — Other Ambulatory Visit: Payer: Medicare HMO

## 2019-05-25 ENCOUNTER — Ambulatory Visit: Payer: Medicare HMO | Admitting: Adult Health

## 2019-05-25 ENCOUNTER — Other Ambulatory Visit: Payer: Self-pay

## 2019-05-25 ENCOUNTER — Inpatient Hospital Stay (HOSPITAL_BASED_OUTPATIENT_CLINIC_OR_DEPARTMENT_OTHER): Payer: Medicare HMO | Admitting: Adult Health

## 2019-05-25 ENCOUNTER — Inpatient Hospital Stay: Payer: Medicare HMO

## 2019-05-25 VITALS — BP 158/80 | HR 64 | Temp 99.1°F | Resp 18 | Ht 64.0 in | Wt 121.8 lb

## 2019-05-25 DIAGNOSIS — D631 Anemia in chronic kidney disease: Secondary | ICD-10-CM | POA: Diagnosis not present

## 2019-05-25 DIAGNOSIS — N189 Chronic kidney disease, unspecified: Secondary | ICD-10-CM

## 2019-05-25 DIAGNOSIS — E538 Deficiency of other specified B group vitamins: Secondary | ICD-10-CM | POA: Diagnosis not present

## 2019-05-25 DIAGNOSIS — Z862 Personal history of diseases of the blood and blood-forming organs and certain disorders involving the immune mechanism: Secondary | ICD-10-CM | POA: Diagnosis not present

## 2019-05-25 LAB — CBC WITH DIFFERENTIAL (CANCER CENTER ONLY)
Abs Immature Granulocytes: 0.01 10*3/uL (ref 0.00–0.07)
Basophils Absolute: 0 10*3/uL (ref 0.0–0.1)
Basophils Relative: 1 %
Eosinophils Absolute: 0.3 10*3/uL (ref 0.0–0.5)
Eosinophils Relative: 5 %
HCT: 25.8 % — ABNORMAL LOW (ref 36.0–46.0)
Hemoglobin: 8.3 g/dL — ABNORMAL LOW (ref 12.0–15.0)
Immature Granulocytes: 0 %
Lymphocytes Relative: 30 %
Lymphs Abs: 2 10*3/uL (ref 0.7–4.0)
MCH: 30.5 pg (ref 26.0–34.0)
MCHC: 32.2 g/dL (ref 30.0–36.0)
MCV: 94.9 fL (ref 80.0–100.0)
Monocytes Absolute: 0.3 10*3/uL (ref 0.1–1.0)
Monocytes Relative: 4 %
Neutro Abs: 3.9 10*3/uL (ref 1.7–7.7)
Neutrophils Relative %: 60 %
Platelet Count: 109 10*3/uL — ABNORMAL LOW (ref 150–400)
RBC: 2.72 MIL/uL — ABNORMAL LOW (ref 3.87–5.11)
RDW: 15 % (ref 11.5–15.5)
WBC Count: 6.5 10*3/uL (ref 4.0–10.5)
nRBC: 0 % (ref 0.0–0.2)

## 2019-05-25 MED ORDER — EPOETIN ALFA-EPBX 40000 UNIT/ML IJ SOLN
INTRAMUSCULAR | Status: AC
  Filled 2019-05-25: qty 1

## 2019-05-25 MED ORDER — CYANOCOBALAMIN 1000 MCG/ML IJ SOLN
INTRAMUSCULAR | Status: AC
  Filled 2019-05-25: qty 1

## 2019-05-25 MED ORDER — CYANOCOBALAMIN 1000 MCG/ML IJ SOLN
1000.0000 ug | Freq: Once | INTRAMUSCULAR | Status: AC
  Administered 2019-05-25: 17:00:00 1000 ug via INTRAMUSCULAR

## 2019-05-25 MED ORDER — EPOETIN ALFA-EPBX 40000 UNIT/ML IJ SOLN
40000.0000 [IU] | Freq: Once | INTRAMUSCULAR | Status: AC
  Administered 2019-05-25: 17:00:00 40000 [IU] via SUBCUTANEOUS

## 2019-05-25 NOTE — Progress Notes (Signed)
[facility] Follow up:    Bailey Bien, MD [address] [place] [address]   DIAGNOSIS: anemia due to chronic kidney disease  SUMMARY OF HEMATOLOGIC HISTORY:  History of anemia due to chronic kidney disease 04/25/2018: Hemoglobin 9.8, MCV 89, platelets 129 08/09/2018: Hemoglobin 9.8, MCV 90.4, platelets 142 11/16/2018:Hemoglobin 7, MCV 94.7, platelets 188 01/02/2019: Hemoglobin 8.4, MCV 91.8, platelets 93 01/30/2019: Hemoglobin 11.8, platelets 125 02/13/2019: Hemoglobin 11.6, platelets 113  Bone marrow biopsy: 01/20/2019: Hypercellular bone marrow 50 to 60% for age with trilineage hematopoiesis, several small lymphoid aggregates present. Mild dyspoietic changes involving the red cells. No increase in blasts. Confirmed that these changes are related to chronic kidney disease. Flow cytometry: Negative, platelet clumping noted suggestive of pseudothrombocytopenia.  Current treatment: 1.B12 injections monthly,began in 05/2017 2.Retacrit injections every 4 weeks if hemoglobin drops below 10, began in 10/2017  CURRENT THERAPY: B12 injections; Retacrit  INTERVAL HISTORY: Bailey [name] 69 y.o. female returns for evaluation of her anemia.  She notes that her kidneys have worsened and she will soon have to start dialysis.  She goes for fistula placement in a couple of weeks.  She is unsure when she will start dialysis.  She is overwhelmed right now with this prospect.  She is fatigued, but otherwise is feeling well.     Patient Active Problem List   Diagnosis Date Noted  . CKD (chronic kidney disease) 05/23/2019  . History of anemia due to chronic kidney disease 10/11/2017  . Vitamin B 12 deficiency 06/09/2017  . Great toe amputation status, right 09/04/2016  . Osteomyelitis of toe of right foot ([place])   . Type II diabetes mellitus, uncontrolled ([place])   . Acute renal failure (ARF) ([place]) 05/10/2016  . SBO (small bowel obstruction) ([place])  05/08/2016  . Diverticular disease of jejunum s/p resection 05/09/2016 05/08/2016  . UTI (urinary tract infection) 05/06/2016  . Dehydration   . Normocytic anemia 05/05/2016    is allergic to no known allergies.  MEDICAL HISTORY: Past Medical History:  Diagnosis Date  . Chronic kidney disease 05/11/2016   ARF-- dehydration  resolved by  discharged  . Diabetes mellitus    Type II  . Diabetic retinopathy ([place])   . Diverticulitis   . History of kidney stones    passed- 6  . Osteomyelitis ([place])   . Vitamin B 12 deficiency 06/09/2017    SURGICAL HISTORY: Past Surgical History:  Procedure Laterality Date  . AMPUTATION Right 08/14/2016   Procedure: RIGHT 1ST RAY AMPUTATION MID-SHAFT;  Surgeon: Newt Minion, MD;  Location: [facility];  Service: Orthopedics;  Laterality: Right;  . INCISION AND DRAINAGE ABSCESS Left 02/11/2014   Procedure: INCISION AND DRAINAGE ABSCESS Left Buttock;  Surgeon: Jackolyn Confer, MD;  Location: [facility];  Service: General;  Laterality: Left;  . LAPAROSCOPIC SMALL BOWEL RESECTION N/A 05/09/2016   Procedure: LAPAROSCOPIC SMALL BOWEL RESECTION;  Surgeon: Michael Boston, MD;  Location: [facility];  Service: General;  Laterality: N/A;  . LAPAROSCOPY N/A 05/09/2016   Procedure: LAPAROSCOPY DIAGNOSTIC, LYSIS OF ADHESIONS, SMALL BOWEL RESECTION X 2;  Surgeon: Michael Boston, MD;  Location: [facility];  Service: General;  Laterality: N/A;  . TOE AMPUTATION     left foot great toe    SOCIAL HISTORY: Social History   Socioeconomic History  . Marital status: Married    Spouse name: Not on file  . Number of children: Not on file  . Years of education: Not on  file  . Highest education level: Not on file  Occupational History  . Not on file  Tobacco Use  . Smoking status: Never Smoker  . Smokeless tobacco: Never Used  Substance and Sexual Activity  . Alcohol use: No    Alcohol/week: 0.0 standard drinks  . Drug use: No  . Sexual activity: Not on file  Other Topics Concern  .  Not on file  Social History Narrative  . Not on file   Social Determinants of Health   Financial Resource Strain:   . Difficulty of Paying Living Expenses:   Food Insecurity:   . Worried About Charity fundraiser in the Last Year:   . Arboriculturist in the Last Year:   Transportation Needs:   . Film/video editor (Medical):   Marland Kitchen Lack of Transportation (Non-Medical):   Physical Activity:   . Days of Exercise per Week:   . Minutes of Exercise per Session:   Stress:   . Feeling of Stress :   Social Connections:   . Frequency of Communication with Friends and Family:   . Frequency of Social Gatherings with Friends and Family:   . Attends Religious Services:   . Active Member of Clubs or Organizations:   . Attends Archivist Meetings:   Marland Kitchen Marital Status:   Intimate Partner Violence:   . Fear of Current or Ex-Partner:   . Emotionally Abused:   Marland Kitchen Physically Abused:   . Sexually Abused:     FAMILY HISTORY: Family History  Problem Relation Age of Onset  . Diabetes Mother   . Asthma Sister     Review of Systems  Constitutional: Positive for fatigue. Negative for appetite change, chills, fever and unexpected weight change.  HENT:   Negative for hearing loss, lump/mass and trouble swallowing.   Eyes: Negative for eye problems and icterus.  Respiratory: Negative for chest tightness, cough and shortness of breath.   Cardiovascular: Negative for chest pain, leg swelling and palpitations.  Gastrointestinal: Negative for abdominal distention, abdominal pain, constipation, diarrhea, nausea and vomiting.  Endocrine: Negative for hot flashes.  Genitourinary: Negative for difficulty urinating.   Musculoskeletal: Negative for arthralgias.  Skin: Negative for itching and rash.  Neurological: Negative for dizziness, extremity weakness, headaches and numbness.  Hematological: Negative for adenopathy. Does not bruise/bleed easily.  Psychiatric/Behavioral: Negative for  depression. The patient is not nervous/anxious.       PHYSICAL EXAMINATION  ECOG PERFORMANCE STATUS: 1 - Symptomatic but completely ambulatory  Vitals:   05/25/19 1604  BP: (!) 158/80  Pulse: 64  Resp: 18  Temp: 99.1 F (37.3 C)  SpO2: 100%    Physical Exam Constitutional:      General: She is not in acute distress.    Appearance: Normal appearance. She is not toxic-appearing.  HENT:     Head: Normocephalic and atraumatic.  Eyes:     General: No scleral icterus. Cardiovascular:     Rate and Rhythm: Normal rate and regular rhythm.     Pulses: Normal pulses.     Heart sounds: Normal heart sounds.  Pulmonary:     Effort: Pulmonary effort is normal.     Breath sounds: Normal breath sounds.  Abdominal:     General: Abdomen is flat. Bowel sounds are normal.     Palpations: Abdomen is soft.  Musculoskeletal:     Cervical back: Neck supple.  Lymphadenopathy:     Cervical: No cervical adenopathy.  Skin:    General:  Skin is warm and dry.     Capillary Refill: Capillary refill takes less than 2 seconds.     Findings: No rash.  Neurological:     Mental Status: She is alert.  Psychiatric:        Mood and Affect: Mood normal.        Behavior: Behavior normal.     LABORATORY DATA:  CBC    Component Value Date/Time   WBC 6.5 05/25/2019 1523   WBC 5.6 01/20/2019 0940   RBC 2.72 (L) 05/25/2019 1523   HGB 8.3 (L) 05/25/2019 1523   HCT 25.8 (L) 05/25/2019 1523   PLT 109 (L) 05/25/2019 1523   MCV 94.9 05/25/2019 1523   MCV 89.6 02/10/2014 1127   MCH 30.5 05/25/2019 1523   MCHC 32.2 05/25/2019 1523   RDW 15.0 05/25/2019 1523   LYMPHSABS 2.0 05/25/2019 1523   MONOABS 0.3 05/25/2019 1523   EOSABS 0.3 05/25/2019 1523   BASOSABS 0.0 05/25/2019 1523    CMP     Component Value Date/Time   NA 143 01/20/2019 0940   K 4.1 01/20/2019 0940   CL 117 (H) 01/20/2019 0940   CO2 17 (L) 01/20/2019 0940   GLUCOSE 116 (H) 01/20/2019 0940   BUN 58 (H) 01/20/2019 0940    CREATININE 5.40 (H) 01/20/2019 0940   CREATININE 0.74 02/10/2014 1220   CALCIUM 8.5 (L) 01/20/2019 0940   PROT 7.1 08/14/2016 1118   ALBUMIN 3.5 08/14/2016 1118   AST 20 08/14/2016 1118   ALT 14 08/14/2016 1118   ALKPHOS 75 08/14/2016 1118   BILITOT 0.6 08/14/2016 1118   GFRNONAA 7 (L) 01/20/2019 0940   GFRAA 9 (L) 01/20/2019 0940         ASSESSMENT and THERAPY PLAN:   History of anemia due to chronic kidney disease 04/25/2018: Hemoglobin 9.8, MCV 89, platelets 129 08/09/2018: Hemoglobin 9.8, MCV 90.4, platelets 142 11/16/2018:Hemoglobin 7, MCV 94.7, platelets 188 01/02/2019: Hemoglobin 8.4, MCV 91.8, platelets 93 01/30/2019: Hemoglobin 11.8, platelets 125 02/13/2019: Hemoglobin 11.6, platelets 113  Bone marrow biopsy: 01/20/2019: Hypercellular bone marrow 50 to 60% for age with trilineage hematopoiesis, several small lymphoid aggregates present. Mild dyspoietic changes involving the red cells. No increase in blasts. Confirmed that these changes are related to chronic kidney disease. Flow cytometry: Negative, platelet clumping noted suggestive of pseudothrombocytopenia.  Current treatment: 1.B12 injections monthly, 2.Retacrit injections every 4 weeks.    Brissa will receive B12 and Retacrit today.  I placed additional lab orders for her next visit to monitor her b12 level and assess her iron studies to ensure that her ferritin is greater than 100.    Since her kidneys are worsening, she will return in 4 weeks to see Dr. Lindi Adie to discuss that with him.  Sometimes nephrology will give these injections to their patients while they are there for dialysis.  This is a perfectly reasonable option which may be more convenient for the patient.  She also may start needing the injections more frequently if her kidneys continue to worsen.    She understands the above and we will see her back in 4 weeks.    She was recommended to continue with the appropriate pandemic precautions.  She knows to call for any questions that may arise between now and her next appointment.  We are happy to see her sooner if needed.    Orders Placed This Encounter  Procedures  . CBC with Differential (Can[facility] Center Only)    Standing Status:  Future    Standing Expiration Date:   05/24/2020  . CMP ([place] only)    Standing Status:   Future    Standing Expiration Date:   05/24/2020  . Save Smear ([facility])    Standing Status:   Future    Standing Expiration Date:   05/24/2020  . Ferritin    Standing Status:   Future    Standing Expiration Date:   05/24/2020  . Iron and TIBC    Standing Status:   Future    Standing Expiration Date:   05/24/2020  . Vitamin B12    Standing Status:   Future    Standing Expiration Date:   05/24/2020  . Folate, Serum    Standing Status:   Future    Standing Expiration Date:   05/24/2020    Total encounter time: 20 minutes*  Wilber Bihari, NP 05/26/19 10:52 AM Medical Oncology and Hematology [facility] [place], [place] [address] Tel. [phone]    Fax. [phone]  *Total Encounter Time as defined by the Centers for Medicare and Medicaid Services includes, in addition to the face-to-face time of a patient visit (documented in the note above) non-face-to-face time: obtaining and reviewing outside history, ordering and reviewing medications, tests or procedures, care coordination (communications with other health care professionals or caregivers) and documentation in the medical record.

## 2019-05-25 NOTE — Patient Instructions (Signed)

## 2019-05-25 NOTE — Assessment & Plan Note (Addendum)
04/25/2018: Hemoglobin 9.8, MCV 89, platelets 129 08/09/2018: Hemoglobin 9.8, MCV 90.4, platelets 142 11/16/2018:Hemoglobin 7, MCV 94.7, platelets 188 01/02/2019: Hemoglobin 8.4, MCV 91.8, platelets 93 01/30/2019: Hemoglobin 11.8, platelets 125 02/13/2019: Hemoglobin 11.6, platelets 113  Bone marrow biopsy: 01/20/2019: Hypercellular bone marrow 50 to 60% for age with trilineage hematopoiesis, several small lymphoid aggregates present. Mild dyspoietic changes involving the red cells. No increase in blasts. Confirmed that these changes are related to chronic kidney disease. Flow cytometry: Negative, platelet clumping noted suggestive of pseudothrombocytopenia.  Current treatment: 1.B12 injections monthly, 2.Retacrit injections every 4 weeks.    Bailey [name] will receive B12 and Retacrit today.  I placed additional lab orders for her next visit to monitor her b12 level and assess her iron studies to ensure that her ferritin is greater than 100.    Since her kidneys are worsening, she will return in 4 weeks to see Dr. Lindi Adie to discuss that with him.  Sometimes nephrology will give these injections to their patients while they are there for dialysis.  This is a perfectly reasonable option which may be more convenient for the patient.  She also may start needing the injections more frequently if her kidneys continue to worsen.    She understands the above and we will see her back in 4 weeks.    She was recommended to continue with the appropriate pandemic precautions. She knows to call for any questions that may arise between now and her next appointment.  We are happy to see her sooner if needed.

## 2019-05-26 ENCOUNTER — Telehealth: Payer: Self-pay | Admitting: Adult Health

## 2019-05-26 NOTE — Telephone Encounter (Signed)
Scheduled appts per 4/1 los. Left voicemail with new appt details. Mailed reminder letter and calendar.

## 2019-05-29 ENCOUNTER — Other Ambulatory Visit: Payer: Self-pay

## 2019-05-30 ENCOUNTER — Telehealth: Payer: Self-pay | Admitting: Hematology and Oncology

## 2019-05-30 NOTE — Telephone Encounter (Signed)
Called pt per 4/6 sch message - no answer . Left message with appt date and time

## 2019-06-06 ENCOUNTER — Other Ambulatory Visit: Payer: Self-pay | Admitting: Family Medicine

## 2019-06-06 ENCOUNTER — Other Ambulatory Visit (HOSPITAL_COMMUNITY)
Admission: RE | Admit: 2019-06-06 | Discharge: 2019-06-06 | Disposition: A | Discharge status: Home / Self Care | Payer: Medicare HMO | Source: Ambulatory Visit | Attending: Vascular Surgery | Admitting: Vascular Surgery

## 2019-06-06 DIAGNOSIS — Z01812 Encounter for preprocedural laboratory examination: Secondary | ICD-10-CM | POA: Insufficient documentation

## 2019-06-06 DIAGNOSIS — Z20822 Contact with and (suspected) exposure to covid-19: Secondary | ICD-10-CM | POA: Insufficient documentation

## 2019-06-06 DIAGNOSIS — Z1231 Encounter for screening mammogram for malignant neoplasm of breast: Secondary | ICD-10-CM

## 2019-06-06 LAB — SARS CORONAVIRUS 2 (TAT 6-24 HRS): SARS Coronavirus 2: NEGATIVE

## 2019-06-07 ENCOUNTER — Other Ambulatory Visit: Payer: Self-pay

## 2019-06-07 ENCOUNTER — Encounter (HOSPITAL_COMMUNITY): Payer: Self-pay | Admitting: Vascular Surgery

## 2019-06-07 NOTE — Progress Notes (Signed)
Anesthesia Chart Review: Same day workup  History of anemia secondary to CKD. Followed by heme/onc. Receives monthly B12 and Retacrit every 4 weeks if Hgb below 10. Last seen 05/25/19 and Hgb 8.3, received Retacrit.   CKD V not yet on HD, followed by Dr. Posey Pronto.   Hx of DMII, previously on insulin, not currently on any antiDM meds, evidently pt stopped of her own volition. No recent A1c in Epic -  Last available was 10.2 on 05/06/2016. Last BMET in [place] 01/20/19 showed glucose 116, creatinine 5.40.  Pt will need DOS labs, eval, and EKG.   Wynonia Musty [facility] Short Stay Center/Anesthesiology Phone [phone] 06/07/2019 4:18 PM

## 2019-06-07 NOTE — Anesthesia Preprocedure Evaluation (Addendum)
Anesthesia Evaluation  Patient identified by MRN, date of birth, ID band Patient awake    Reviewed: Allergy & Precautions, NPO status , Patient's Chart, lab work & pertinent test results, reviewed documented beta blocker date and time   Airway Mallampati: II  TM Distance: >3 FB Neck ROM: Full    Dental  (+) Edentulous Upper, Missing, Poor Dentition, Dental Advisory Given,    Pulmonary    Pulmonary exam normal breath sounds clear to auscultation       Cardiovascular hypertension, Pt. on medications and Pt. on home beta blockers + Peripheral Vascular Disease  Normal cardiovascular exam Rhythm:Regular Rate:Normal     Neuro/Psych PSYCHIATRIC DISORDERS Depression negative neurological ROS     GI/Hepatic Neg liver ROS, Hx diverticulosis    Endo/Other  diabetes, Type 2  Renal/GU ESRF and DialysisRenal disease  negative genitourinary   Musculoskeletal negative musculoskeletal ROS (+)   Abdominal Normal abdominal exam  (+)   Peds  Hematology  (+) Blood dyscrasia, anemia , H/H 8.3/25.8, plt 109   Anesthesia Other Findings   Reproductive/Obstetrics negative OB ROS                                                             Anesthesia Evaluation  Patient identified by MRN, date of birth, ID band Patient awake    Reviewed: Allergy & Precautions, NPO status , Patient's Chart, lab work & pertinent test results  Airway Mallampati: II  TM Distance: >3 FB Neck ROM: Full    Dental  (+) Upper Dentures   Pulmonary neg pulmonary ROS,    breath sounds clear to auscultation       Cardiovascular negative cardio ROS   Rhythm:Regular Rate:Normal     Neuro/Psych negative neurological ROS     GI/Hepatic negative GI ROS, Neg liver ROS,   Endo/Other  diabetes, Type 2, Insulin Dependent  Renal/GU Renal disease     Musculoskeletal   Abdominal   Peds  Hematology  (+) anemia ,    Anesthesia Other Findings   Reproductive/Obstetrics                             Lab Results  Component Value Date   WBC 6.5 05/25/2019   HGB 8.3 (L) 05/25/2019   HCT 25.8 (L) 05/25/2019   MCV 94.9 05/25/2019   PLT 109 (L) 05/25/2019   Lab Results  Component Value Date   CREATININE 5.40 (H) 01/20/2019   BUN 58 (H) 01/20/2019   NA 143 01/20/2019   K 4.1 01/20/2019   CL 117 (H) 01/20/2019   CO2 17 (L) 01/20/2019    Anesthesia Physical Anesthesia Plan  ASA: III  Anesthesia Plan: MAC and Regional   Post-op Pain Management:    Induction: Intravenous  PONV Risk Score and Plan: 2 and Ondansetron and Dexamethasone  Airway Management Planned:   Additional Equipment:   Intra-op Plan:   Post-operative Plan:   Informed Consent: I have reviewed the patients History and Physical, chart, labs and discussed the procedure including the risks, benefits and alternatives for the proposed anesthesia with the patient or authorized representative who has indicated his/her understanding and acceptance.     Plan Discussed with: CRNA  Anesthesia Plan Comments:  Anesthesia Quick Evaluation  Anesthesia Physical Anesthesia Plan  ASA: III  Anesthesia Plan: MAC and Regional   Post-op Pain Management:  Regional for Post-op pain   Induction:   PONV Risk Score and Plan: 2 and Propofol infusion and TIVA  Airway Management Planned: Natural Airway and Simple Face Mask  Additional Equipment: None  Intra-op Plan:   Post-operative Plan:   Informed Consent: I have reviewed the patients History and Physical, chart, labs and discussed the procedure including the risks, benefits and alternatives for the proposed anesthesia with the patient or authorized representative who has indicated his/her understanding and acceptance.       Plan Discussed with: CRNA  Anesthesia Plan Comments: (History of anemia secondary to CKD. Followed by heme/onc.  Receives monthly B12 and Retacrit every 4 weeks if Hgb below 10. Last seen 05/25/19 and Hgb 8.3, received Retacrit.   CKD V not yet on HD, followed by Dr. Posey Pronto.   Hx of DMII, previously on insulin, not currently on any antiDM meds, evidently pt stopped of her own volition. No recent A1c in Epic -  Last available was 10.2 on 05/06/2016. Last BMET in [place] 01/20/19 showed glucose 116, creatinine 5.40.  Pt will need DOS labs, eval, and EKG. )       Anesthesia Quick Evaluation

## 2019-06-07 NOTE — Progress Notes (Signed)
Patient denies shortness of breath, fever, cough and chest pain.  PCP - Dr [name] Cardiologist - denies Oncology - Dr Nicholas Lose Nephrology - Dr Posey Pronto Ophthalmology - Dr Laurie Panda  Chest x-ray - n/a EKG - DOS 06/08/19 Stress Test - n/a ECHO - n/a Cardiac Cath - n/a  Fasting Blood Sugar - Unknown Checks Blood Sugar __0__times a day Per patient preference, stopped Lantus insulin.  No DM meds currently.  Anesthesia review: Yes  STOP now taking any Aspirin (unless otherwise instructed by your surgeon), Aleve, Naproxen, Ibuprofen, Motrin, Advil, Goody's, BC's, all herbal medications, fish oil, and all vitamins.   Coronavirus Screening Covid test on 06/06/19 was negative.  Patient verbalized understanding of instructions that were given via phone.

## 2019-06-08 ENCOUNTER — Encounter (HOSPITAL_COMMUNITY)
Admission: RE | Disposition: A | Discharge status: Home / Self Care | Payer: Self-pay | Source: Home / Self Care | Attending: Vascular Surgery

## 2019-06-08 ENCOUNTER — Encounter (HOSPITAL_COMMUNITY): Payer: Self-pay | Admitting: Vascular Surgery

## 2019-06-08 ENCOUNTER — Ambulatory Visit (HOSPITAL_COMMUNITY): Payer: Medicare HMO | Admitting: Physician Assistant

## 2019-06-08 ENCOUNTER — Other Ambulatory Visit: Payer: Self-pay

## 2019-06-08 ENCOUNTER — Ambulatory Visit (HOSPITAL_COMMUNITY)
Admission: RE | Admit: 2019-06-08 | Discharge: 2019-06-08 | Disposition: A | Discharge status: Home / Self Care | Payer: Medicare HMO | Attending: Vascular Surgery | Admitting: Vascular Surgery

## 2019-06-08 DIAGNOSIS — N185 Chronic kidney disease, stage 5: Secondary | ICD-10-CM

## 2019-06-08 DIAGNOSIS — Z91128 Patient's intentional underdosing of medication regimen for other reason: Secondary | ICD-10-CM | POA: Insufficient documentation

## 2019-06-08 DIAGNOSIS — T383X6A Underdosing of insulin and oral hypoglycemic [antidiabetic] drugs, initial encounter: Secondary | ICD-10-CM | POA: Diagnosis not present

## 2019-06-08 DIAGNOSIS — I120 Hypertensive chronic kidney disease with stage 5 chronic kidney disease or end stage renal disease: Secondary | ICD-10-CM | POA: Diagnosis not present

## 2019-06-08 DIAGNOSIS — Z89431 Acquired absence of right foot: Secondary | ICD-10-CM | POA: Insufficient documentation

## 2019-06-08 DIAGNOSIS — N186 End stage renal disease: Secondary | ICD-10-CM | POA: Diagnosis not present

## 2019-06-08 DIAGNOSIS — E11319 Type 2 diabetes mellitus with unspecified diabetic retinopathy without macular edema: Secondary | ICD-10-CM | POA: Diagnosis not present

## 2019-06-08 DIAGNOSIS — Z79899 Other long term (current) drug therapy: Secondary | ICD-10-CM | POA: Diagnosis not present

## 2019-06-08 DIAGNOSIS — D631 Anemia in chronic kidney disease: Secondary | ICD-10-CM | POA: Diagnosis not present

## 2019-06-08 DIAGNOSIS — Z89412 Acquired absence of left great toe: Secondary | ICD-10-CM | POA: Diagnosis not present

## 2019-06-08 DIAGNOSIS — E1122 Type 2 diabetes mellitus with diabetic chronic kidney disease: Secondary | ICD-10-CM | POA: Diagnosis not present

## 2019-06-08 DIAGNOSIS — Z992 Dependence on renal dialysis: Secondary | ICD-10-CM | POA: Diagnosis not present

## 2019-06-08 DIAGNOSIS — E1151 Type 2 diabetes mellitus with diabetic peripheral angiopathy without gangrene: Secondary | ICD-10-CM | POA: Diagnosis not present

## 2019-06-08 HISTORY — DX: Depression, unspecified: F32.A

## 2019-06-08 HISTORY — DX: Anemia, unspecified: D64.9

## 2019-06-08 HISTORY — DX: Presence of dental prosthetic device (complete) (partial): Z97.2

## 2019-06-08 HISTORY — PX: BASCILIC VEIN TRANSPOSITION: SHX5742

## 2019-06-08 HISTORY — DX: Essential (primary) hypertension: I10

## 2019-06-08 HISTORY — DX: Vitamin D deficiency, unspecified: E55.9

## 2019-06-08 LAB — POCT I-STAT, CHEM 8
BUN: 65 mg/dL — ABNORMAL HIGH (ref 8–23)
Calcium, Ion: 1.09 mmol/L — ABNORMAL LOW (ref 1.15–1.40)
Chloride: 117 mmol/L — ABNORMAL HIGH (ref 98–111)
Creatinine, Ser: 6.9 mg/dL — ABNORMAL HIGH (ref 0.44–1.00)
Glucose, Bld: 92 mg/dL (ref 70–99)
HCT: 30 % — ABNORMAL LOW (ref 36.0–46.0)
Hemoglobin: 10.2 g/dL — ABNORMAL LOW (ref 12.0–15.0)
Potassium: 4.3 mmol/L (ref 3.5–5.1)
Sodium: 143 mmol/L (ref 135–145)
TCO2: 17 mmol/L — ABNORMAL LOW (ref 22–32)

## 2019-06-08 LAB — GLUCOSE, CAPILLARY
Glucose-Capillary: 91 mg/dL (ref 70–99)
Glucose-Capillary: 85 mg/dL (ref 70–99)

## 2019-06-08 SURGERY — TRANSPOSITION, VEIN, BASILIC
Anesthesia: Monitor Anesthesia Care | Site: Arm Upper | Laterality: Left

## 2019-06-08 MED ORDER — PROPOFOL 10 MG/ML IV BOLUS
INTRAVENOUS | Status: AC
  Filled 2019-06-08: qty 40

## 2019-06-08 MED ORDER — ACETAMINOPHEN 500 MG PO TABS
1000.0000 mg | ORAL_TABLET | Freq: Once | ORAL | Status: AC
  Administered 2019-06-08: 1000 mg via ORAL

## 2019-06-08 MED ORDER — SODIUM CHLORIDE 0.9 % IV SOLN
INTRAVENOUS | Status: DC | PRN
  Administered 2019-06-08: 500 mL

## 2019-06-08 MED ORDER — SODIUM CHLORIDE 0.9 % IV SOLN: INTRAVENOUS | Status: DC

## 2019-06-08 MED ORDER — 0.9 % SODIUM CHLORIDE (POUR BTL) OPTIME
TOPICAL | Status: DC | PRN
  Administered 2019-06-08: 1000 mL

## 2019-06-08 MED ORDER — FENTANYL CITRATE (PF) 100 MCG/2ML IJ SOLN: 25.0000 ug | INTRAMUSCULAR | Status: DC | PRN

## 2019-06-08 MED ORDER — HEPARIN SODIUM (PORCINE) 1000 UNIT/ML IJ SOLN
INTRAMUSCULAR | Status: DC | PRN
  Administered 2019-06-08: 3000 [IU] via INTRAVENOUS

## 2019-06-08 MED ORDER — FENTANYL CITRATE (PF) 100 MCG/2ML IJ SOLN
50.0000 ug | Freq: Once | INTRAMUSCULAR | Status: DC
  Filled 2019-06-08: qty 1

## 2019-06-08 MED ORDER — LIDOCAINE HCL (PF) 2 % IJ SOLN
INTRAMUSCULAR | Status: DC | PRN
  Administered 2019-06-08: 100 mg via PERINEURAL

## 2019-06-08 MED ORDER — CHLORHEXIDINE GLUCONATE 4 % EX LIQD: 60.0000 mL | Freq: Once | CUTANEOUS | Status: DC

## 2019-06-08 MED ORDER — ACETAMINOPHEN 500 MG PO TABS
ORAL_TABLET | ORAL | Status: AC
  Filled 2019-06-08: qty 2

## 2019-06-08 MED ORDER — LIDOCAINE HCL (PF) 1 % IJ SOLN
INTRAMUSCULAR | Status: AC
  Filled 2019-06-08: qty 30

## 2019-06-08 MED ORDER — MIDAZOLAM HCL 2 MG/2ML IJ SOLN
INTRAMUSCULAR | Status: AC
  Filled 2019-06-08: qty 2

## 2019-06-08 MED ORDER — CEFAZOLIN SODIUM-DEXTROSE 2-4 GM/100ML-% IV SOLN
INTRAVENOUS | Status: AC
  Filled 2019-06-08: qty 100

## 2019-06-08 MED ORDER — ONDANSETRON HCL 4 MG/2ML IJ SOLN: 4.0000 mg | Freq: Once | INTRAMUSCULAR | Status: DC | PRN

## 2019-06-08 MED ORDER — FENTANYL CITRATE (PF) 250 MCG/5ML IJ SOLN
INTRAMUSCULAR | Status: AC
  Filled 2019-06-08: qty 5

## 2019-06-08 MED ORDER — PROPOFOL 10 MG/ML IV BOLUS
INTRAVENOUS | Status: DC | PRN
  Administered 2019-06-08: 20 mg via INTRAVENOUS
  Administered 2019-06-08: 10 mg via INTRAVENOUS

## 2019-06-08 MED ORDER — CEFAZOLIN SODIUM-DEXTROSE 2-4 GM/100ML-% IV SOLN
2.0000 g | INTRAVENOUS | Status: AC
  Administered 2019-06-08: 2 g via INTRAVENOUS
  Filled 2019-06-08: qty 100

## 2019-06-08 MED ORDER — FENTANYL CITRATE (PF) 100 MCG/2ML IJ SOLN
INTRAMUSCULAR | Status: AC
  Administered 2019-06-08: 50 ug
  Filled 2019-06-08: qty 2

## 2019-06-08 MED ORDER — OXYCODONE-ACETAMINOPHEN 5-325 MG PO TABS: 1.0000 | ORAL_TABLET | Freq: Four times a day (QID) | ORAL | 0 refills | Status: DC | PRN

## 2019-06-08 MED ORDER — SODIUM CHLORIDE 0.9 % IV SOLN
INTRAVENOUS | Status: AC
  Filled 2019-06-08: qty 1.2

## 2019-06-08 MED ORDER — FENTANYL CITRATE (PF) 100 MCG/2ML IJ SOLN
INTRAMUSCULAR | Status: DC | PRN
  Administered 2019-06-08: 25 ug via INTRAVENOUS

## 2019-06-08 MED ORDER — PROTAMINE SULFATE 10 MG/ML IV SOLN
INTRAVENOUS | Status: DC | PRN
  Administered 2019-06-08: 20 mg via INTRAVENOUS

## 2019-06-08 MED ORDER — ROPIVACAINE HCL 5 MG/ML IJ SOLN
INTRAMUSCULAR | Status: DC | PRN
  Administered 2019-06-08: 20 mL via PERINEURAL

## 2019-06-08 MED ORDER — PROPOFOL 500 MG/50ML IV EMUL
INTRAVENOUS | Status: DC | PRN
  Administered 2019-06-08: 35 ug/kg/min via INTRAVENOUS

## 2019-06-08 SURGICAL SUPPLY — 37 items
ARMBAND PINK RESTRICT EXTREMIT (MISCELLANEOUS) ×3 IMPLANT
CANISTER SUCT 3000ML PPV (MISCELLANEOUS) ×3 IMPLANT
CLIP VESOCCLUDE MED 24/CT (CLIP) ×3 IMPLANT
CLIP VESOCCLUDE SM WIDE 24/CT (CLIP) ×3 IMPLANT
COVER PROBE W GEL 5X96 (DRAPES) ×3 IMPLANT
COVER WAND RF STERILE (DRAPES) ×3 IMPLANT
DECANTER SPIKE VIAL GLASS SM (MISCELLANEOUS) ×3 IMPLANT
DERMABOND ADVANCED (GAUZE/BANDAGES/DRESSINGS) ×2
DERMABOND ADVANCED .7 DNX12 (GAUZE/BANDAGES/DRESSINGS) ×1 IMPLANT
ELECT REM PT RETURN 9FT ADLT (ELECTROSURGICAL) ×3
ELECTRODE REM PT RTRN 9FT ADLT (ELECTROSURGICAL) ×1 IMPLANT
GLOVE BIO SURGEON STRL SZ7.5 (GLOVE) ×3 IMPLANT
GLOVE BIOGEL PI IND STRL 8 (GLOVE) ×1 IMPLANT
GLOVE BIOGEL PI INDICATOR 8 (GLOVE) ×2
GOWN STRL REUS W/ TWL LRG LVL3 (GOWN DISPOSABLE) ×2 IMPLANT
GOWN STRL REUS W/ TWL XL LVL3 (GOWN DISPOSABLE) ×2 IMPLANT
GOWN STRL REUS W/TWL LRG LVL3 (GOWN DISPOSABLE) ×4
GOWN STRL REUS W/TWL XL LVL3 (GOWN DISPOSABLE) ×6
HEMOSTAT SPONGE AVITENE ULTRA (HEMOSTASIS) IMPLANT
KIT BASIN OR (CUSTOM PROCEDURE TRAY) ×3 IMPLANT
KIT TURNOVER KIT B (KITS) ×3 IMPLANT
NDL HYPO 25GX1X1/2 BEV (NEEDLE) ×1 IMPLANT
NEEDLE HYPO 25GX1X1/2 BEV (NEEDLE) ×3 IMPLANT
NS IRRIG 1000ML POUR BTL (IV SOLUTION) ×3 IMPLANT
PACK CV ACCESS (CUSTOM PROCEDURE TRAY) ×3 IMPLANT
PAD ARMBOARD 7.5X6 YLW CONV (MISCELLANEOUS) ×6 IMPLANT
SUT MNCRL AB 4-0 PS2 18 (SUTURE) ×3 IMPLANT
SUT PROLENE 6 0 BV (SUTURE) ×3 IMPLANT
SUT PROLENE 7 0 BV 1 (SUTURE) IMPLANT
SUT SILK 2 0 SH (SUTURE) IMPLANT
SUT VIC AB 2-0 CT1 27 (SUTURE) ×3
SUT VIC AB 2-0 CT1 TAPERPNT 27 (SUTURE) ×1 IMPLANT
SUT VIC AB 3-0 SH 27 (SUTURE) ×4
SUT VIC AB 3-0 SH 27X BRD (SUTURE) ×2 IMPLANT
TOWEL GREEN STERILE (TOWEL DISPOSABLE) ×3 IMPLANT
UNDERPAD 30X30 (UNDERPADS AND DIAPERS) ×3 IMPLANT
WATER STERILE IRR 1000ML POUR (IV SOLUTION) ×3 IMPLANT

## 2019-06-08 NOTE — Discharge Instructions (Signed)
° °  [facility] ° °Discharge Instructions ° °AV Fistula or Graft Surgery for Dialysis Access ° °Please refer to the following instructions for your post-procedure care. Your surgeon or physician assistant will discuss any changes with you. ° °Activity ° °You may drive the day following your surgery, if you are comfortable and no longer taking prescription pain medication. Resume full activity as the soreness in your incision resolves. ° °Bathing/Showering ° °You may shower after you go home. Keep your incision dry for 48 hours. Do not soak in a bathtub, hot tub, or swim until the incision heals completely. You may not shower if you have a hemodialysis catheter. ° °Incision Care ° °Clean your incision with mild soap and water after 48 hours. Pat the area dry with a clean towel. You do not need a bandage unless otherwise instructed. Do not apply any ointments or creams to your incision. You may have skin glue on your incision. Do not peel it off. It will come off on its own in about one week. Your arm may swell a bit after surgery. To reduce swelling use pillows to elevate your arm so it is above your heart. Your doctor will tell you if you need to lightly wrap your arm with an ACE bandage. ° °Diet ° °Resume your normal diet. There are not special food restrictions following this procedure. In order to heal from your surgery, it is CRITICAL to get adequate nutrition. Your body requires vitamins, minerals, and protein. Vegetables are the best source of vitamins and minerals. Vegetables also provide the perfect balance of protein. Processed food has little nutritional value, so try to avoid this. ° °Medications ° °Resume taking all of your medications. If your incision is causing pain, you may take over-the counter pain relievers such as acetaminophen (Tylenol). If you were prescribed a stronger pain medication, please be aware these medications can cause nausea and constipation. Prevent  nausea by taking the medication with a snack or meal. Avoid constipation by drinking plenty of fluids and eating foods with high amount of fiber, such as fruits, vegetables, and grains. Do not take Tylenol if you are taking prescription pain medications. ° ° ° ° °Follow up °Your surgeon may want to see you in the office following your access surgery. If so, this will be arranged at the time of your surgery. ° °Please call us immediately for any of the following conditions: ° °Increased pain, redness, drainage (pus) from your incision site °Fever of 101 degrees or higher °Severe or worsening pain at your incision site °Hand pain or numbness. ° °Reduce your risk of vascular disease: ° °Stop smoking. If you would like help, call [facility] at 1-800-QUIT-NOW (1-800-784-8669) or [facility] at 336-586-4000 ° °Manage your cholesterol °Maintain a desired weight °Control your diabetes °Keep your blood pressure down ° °Dialysis ° °It will take several weeks to several months for your new dialysis access to be ready for use. Your surgeon will determine when it is OK to use it. Your nephrologist will continue to direct your dialysis. You can continue to use your Permcath until your new access is ready for use. ° °If you have any questions, please call the office at [phone]. ° °

## 2019-06-08 NOTE — Anesthesia Procedure Notes (Signed)
Anesthesia Regional Block: Supraclavicular block   Pre-Anesthetic Checklist: ,, timeout performed, Correct Patient, Correct Site, Correct Laterality, Correct Procedure, Correct Position, site marked, Risks and benefits discussed,  Surgical consent,  Pre-op evaluation,  At surgeon's request and post-op pain management  Laterality: Left  Prep: Maximum Sterile Barrier Precautions used, chloraprep       Needles:  Injection technique: Single-shot  Needle Type: Echogenic Stimulator Needle     Needle Length: 9cm  Needle Gauge: 22     Additional Needles:   Procedures:,,,, ultrasound used (permanent image in chart),,,,  Narrative:  Start time: 06/08/2019 9:10 AM End time: 06/08/2019 9:20 AM Injection made incrementally with aspirations every 5 mL.  Performed by: Personally  Anesthesiologist: [name], DO  Additional Notes: Monitors applied. No increased pain on injection. No increased resistance to injection. Injection made in 5cc increments. Good needle visualization. Patient tolerated procedure well.  Additional local anesthetic infiltrated for intercostobrachial nerve block

## 2019-06-08 NOTE — H&P (Signed)
History and Physical Interval Note:  06/08/2019 11:13 AM  Bailey [name]  has presented today for surgery, with the diagnosis of END STAGE RENAL DISEASE.  The various methods of treatment have been discussed with the patient and family. After consideration of risks, benefits and other options for treatment, the patient has consented to  Procedure(s): Bailey [name] (Left) as a surgical intervention.  The patient's history has been reviewed, patient examined, no change in status, stable for surgery.  I have reviewed the patient's chart and labs.  Questions were answered to the patient's satisfaction.    Left arm AVF.  Bailey [name]  Patient name: Bailey [name] MRN: [id number] DOB: 1950/11/07 Sex: female  REASON FOR CONSULT: Evaluate for permanent AV fistula access  HPI:  Bailey [name] is a 69 y.o. female, with history of diabetes, HTN, HLD, anemia of chronic disease, as well as stage V CKD that presents for evaluation of permanent AV fistula access. Patient states she has not started dialysis at this time. She is right-handed. She is no longer working. She denies any previous upper extremity access or fistulas or grafts in the past. No chest wall implants. No catheter at this time. States she did go to class on kidney disease and discussed transplant.      Past Medical History:  Diagnosis Date  . Chronic kidney disease 05/11/2016   ARF-- dehydration resolved by discharged  . Diabetes mellitus    Type II  . Diabetic retinopathy ([place])   . Diverticulitis   . History of kidney stones    passed- 6  . Osteomyelitis ([place])   . Vitamin B 12 deficiency 06/09/2017        Past Surgical History:  Procedure Laterality Date  . AMPUTATION Right 08/14/2016   Procedure: RIGHT 1ST RAY AMPUTATION MID-SHAFT; Surgeon: Newt Minion, MD; Location: [facility]; Service: Orthopedics; Laterality: Right;  . INCISION AND DRAINAGE ABSCESS Left 02/11/2014   Procedure: INCISION AND  DRAINAGE ABSCESS Left Buttock; Surgeon: Jackolyn Confer, MD; Location: [facility]; Service: General; Laterality: Left;  . LAPAROSCOPIC SMALL BOWEL RESECTION N/A 05/09/2016   Procedure: LAPAROSCOPIC SMALL BOWEL RESECTION; Surgeon: Michael Boston, MD; Location: [facility]; Service: General; Laterality: N/A;  . LAPAROSCOPY N/A 05/09/2016   Procedure: LAPAROSCOPY DIAGNOSTIC, LYSIS OF ADHESIONS, SMALL BOWEL RESECTION X 2; Surgeon: Michael Boston, MD; Location: [facility]; Service: General; Laterality: N/A;  . TOE AMPUTATION     left foot great toe        Family History  Problem Relation Age of Onset  . Diabetes Mother   . Asthma Sister    SOCIAL HISTORY:  Social History        Socioeconomic History  . Marital status: Married    Spouse name: Not on file  . Number of children: Not on file  . Years of education: Not on file  . Highest education level: Not on file  Occupational History  . Not on file  Tobacco Use  . Smoking status: Never Smoker  . Smokeless tobacco: Never Used  Substance and Sexual Activity  . Alcohol use: No    Alcohol/week: 0.0 standard drinks  . Drug use: No  . Sexual activity: Not on file  Other Topics Concern  . Not on file  Social History Narrative  . Not on file   Social Determinants of Health      Financial Resource Strain:   . Difficulty of Paying Living Expenses:   Food Insecurity:   .  Worried About Programme researcher, broadcasting/film/video in the Last Year:   . Barista in the Last Year:   Transportation Needs:   . Freight forwarder (Medical):   Marland Kitchen Lack of Transportation (Non-Medical):   Physical Activity:   . Days of Exercise per Week:   . Minutes of Exercise per Session:   Stress:   . Feeling of Stress :   Social Connections:   . Frequency of Communication with Friends and Family:   . Frequency of Social Gatherings with Friends and Family:   . Attends Religious Services:   . Active Member of Clubs or Organizations:   . Attends Banker Meetings:   Marland Kitchen  Marital Status:   Intimate Partner Violence:   . Fear of Current or Ex-Partner:   . Emotionally Abused:   Marland Kitchen Physically Abused:   . Sexually Abused:        Allergies  Allergen Reactions  . No Known Allergies          Current Outpatient Medications  Medication Sig Dispense Refill  . acetaminophen (TYLENOL) 500 MG tablet Take 500 mg by mouth every 6 (six) hours as needed for moderate pain (pain).    . carvedilol (COREG) 12.5 MG tablet Take 12.5 mg by mouth 2 (two) times daily with a meal.    . insulin glargine (LANTUS) 100 UNIT/ML injection Inject 15 Units into the skin daily. Takes at 1300    . sodium bicarbonate 650 MG tablet Take 650 mg by mouth 2 (two) times daily.    . ferrous sulfate 325 (65 FE) MG tablet Take 325 mg by mouth 2 (two) times daily with a meal.    . HYDROcodone-acetaminophen (NORCO/VICODIN) 5-325 MG tablet Take 1 tablet by mouth every 4 (four) hours as needed for moderate pain. (Patient not taking: Reported on 05/23/2019) 40 tablet 0   No current facility-administered medications for this visit.   REVIEW OF SYSTEMS:  [X]  denotes positive finding, [ ]  denotes negative finding  Cardiac  Comments:  Chest pain or chest pressure:    Shortness of breath upon exertion:    Short of breath when lying flat:    Irregular heart rhythm:        Vascular    Pain in calf, thigh, or hip brought on by ambulation:    Pain in feet at night that wakes you up from your sleep:     Blood clot in your veins:    Leg swelling:         Pulmonary    Oxygen at home:    Productive cough:     Wheezing:         Neurologic    Sudden weakness in arms or legs:     Sudden numbness in arms or legs:     Sudden onset of difficulty speaking or slurred speech:    Temporary loss of vision in one eye:     Problems with dizziness:         Gastrointestinal    Blood in stool:     Vomited blood:         Genitourinary    Burning when urinating:     Blood in urine:        Psychiatric     Major depression:         Hematologic    Bleeding problems:    Problems with blood clotting too easily:        Skin  Rashes or ulcers:        Constitutional    Fever or chills:    PHYSICAL EXAM:     Vitals:   05/23/19 1215  BP: (!) 166/82  Pulse: 64  Resp: 14  Temp: (!) 97.4 F (36.3 C)  TempSrc: Temporal  SpO2: 99%  Weight: 120 lb (54.4 kg)  Height: $Remove'5\' 4"'[id number]$  (1.626 m)   GENERAL: The patient is a well-nourished female, in no acute distress. The vital signs are documented above.  CARDIAC: There is a regular rate and rhythm.  VASCULAR:  Palpable radial and brachial pulse in bilateral upper extremities  PULMONARY: There is good air exchange bilaterally without wheezing or rales.  ABDOMEN: Soft and non-tender with normal pitched bowel sounds.  MUSCULOSKELETAL: There are no major deformities or cyanosis.  NEUROLOGIC: No focal weakness or paresthesias are detected.  SKIN: There are no ulcers or rashes noted.  PSYCHIATRIC: The patient has a normal affect.  DATA:  Upper extremity arterial duplex shows biphasic waveforms bilaterally except for the right ulnar artery that has monophasic waveform at the wrist.  Upper extremity vein mapping shows very small surface veins bilaterally but she has a marginal basilic vein in the left arm.  Assessment/Plan:  69 year old female with stage V CKD and diabetes that presents for evaluation of permanent dialysis access. I discussed indications and steps of dialysis access in detail with the patient including plan for nondominant arm access which would be her left arm. She has very small surface veins bilaterally but there is a marginal basilic vein on the left that would be potentially usable. I discussed typically placed basilic vein based fistulas in two stages. I discussed risks and benefits including bleeding infection steal syndrome failure to mature etc. Ultimately she would like to discuss with her husband and also has an appoint with Dr.  Posey Pronto her nephrologist and would like to further discuss with him. She will call back to schedule and I provided her with our card with information for scheduling in our clinic.  Bailey Heck, MD  [facility]  Office: [phone]

## 2019-06-08 NOTE — Progress Notes (Signed)
Orthopedic Tech Progress Note Patient Details:  Bailey [name] [birth year] [id number] PACU RN called requesting an ARM SLING. They was getting patient dress and said that they would apply sling to patient  Ortho Devices Type of Ortho Device: Arm sling Ortho Device/Splint Interventions: Other (comment)   Post Interventions Patient Tolerated: Other (comment) Instructions Provided: Other (comment)   Janit Pagan 06/08/2019, 1:14 PM

## 2019-06-08 NOTE — Op Note (Signed)
OPERATIVE NOTE   PROCEDURE: left brachiocephalic arteriovenous fistula placement  PRE-OPERATIVE DIAGNOSIS: chronic kidney disease stage V  POST-OPERATIVE DIAGNOSIS: same as above   SURGEON: Marty Heck, MD  ASSISTANT(S): Roxy Horseman, PA  ANESTHESIA: regional  ESTIMATED BLOOD LOSS: Minimal  FINDING(S): 1.  Cephalic vein: 4-5 mm, acceptable 2.  Brachial artery: 3 mm, disease free 3.  Venous outflow: palpable thrill  4.  Radial flow: palpable radial pulse  SPECIMEN(S):  none  INDICATIONS:   Bailey [name] is a 69 y.o. female who presents with stage V CKD and need for permanent hemodialysis access.  The patient is scheduled for left arm arteriovenous fistula placement.  The patient is aware the risks include but are not limited to: bleeding, infection, steal syndrome, nerve damage, ischemic monomelic neuropathy, failure to mature, and need for additional procedures.  The patient is aware of the risks of the procedure and elects to proceed forward.   DESCRIPTION: After full informed written consent was obtained from the patient, the patient was brought back to the operating room and placed supine upon the operating table.  Prior to induction, the patient received IV antibiotics.   After obtaining adequate anesthesia, the patient was then prepped and draped in the standard fashion for a left arm access procedure.  I turned my attention first to identifying the patient's cephalic vein and basilic vein.  The cephalic vein actually looked very healthy and appropriate size for use.  I then marked the cephalic vein and brachial artery just below the elbow.    I made a transverse incision just below the level of the antecubitum and dissected through the subcutaneous tissue and fascia to gain exposure of the brachial artery.  This was noted to be 3 mm in diameter externally.  This was dissected out proximally and distally and controlled with vessel loops .  I then dissected  out the cephalic vein.  This was noted to be 4-5 mm in diameter externally at the eblow.  The distal segment of the vein was ligated with a  2-0 silk, and the vein was transected.  The proximal segment was interrogated with serial dilators.  The vein accepted up to a 4 mm dilator without any difficulty.  I then instilled the heparinized saline into the vein and clamped it.  At this point, I reset my exposure of the brachial artery.  The patient was given 3000 units of IV heparin.  I placed the artery under tension proximally and distally.  I made an arteriotomy with a #11 blade, and then I extended the arteriotomy with a Potts scissor.  I injected heparinized saline proximal and distal to this arteriotomy.  The vein was then sewn to the artery in an end-to-side configuration with a running stitch of 6-0 Prolene.  Prior to completing this anastomosis, I allowed the vein and artery to backbleed.  There was no evidence of clot from any vessels.  I completed the anastomosis in the usual fashion and then released all vessel loops and clamps.    There was a palpable thrill in the venous outflow, and there was a palpable radial pulse.  At this point, I irrigated out the surgical wound.  There was no further active bleeding.  The subcutaneous tissue was reapproximated with a running stitch of 3-0 Vicryl.  The skin was then reapproximated with a running subcuticular stitch of 4-0 Monocryl.  The skin was then cleaned, dried, and reinforced with Dermabond.  The patient tolerated this  procedure well.   COMPLICATIONS: None  CONDITION: Stable  Marty Heck, MD [facility]: [phone]  06/08/2019, 12:12 PM

## 2019-06-08 NOTE — Anesthesia Postprocedure Evaluation (Signed)
Anesthesia Post Note  Patient: Bailey [name] DOBLER  Procedure(s) Performed: LEFT BRACHIOCEPHALIC VEIN CREATION (Left Arm Upper)     Patient location during evaluation: PACU Anesthesia Type: Regional and MAC Level of consciousness: awake and alert Pain management: pain level controlled Vital Signs Assessment: post-procedure vital signs reviewed and stable Respiratory status: spontaneous breathing, nonlabored ventilation and respiratory function stable Cardiovascular status: blood pressure returned to baseline and stable Postop Assessment: no apparent nausea or vomiting Anesthetic complications: no    Last Vitals:  Vitals:   06/08/19 1251 06/08/19 1255  BP:  (!) 155/62  Pulse: 63 63  Resp: 14 16  Temp:  36.4 C  SpO2: 98% 99%    Last Pain:  Vitals:   06/08/19 1255  TempSrc:   PainSc: 0-No pain                 [name]

## 2019-06-08 NOTE — Transfer of Care (Signed)
Immediate Anesthesia Transfer of Care Note  Patient: Bailey [name]  Procedure(s) Performed: LEFT BRACHIOCEPHALIC VEIN TRANSPOSITION (Left Arm Upper)  Patient Location: PACU  Anesthesia Type:MAC and Regional  Level of Consciousness: awake, alert , oriented and sedated  Airway & Oxygen Therapy: Patient Spontanous Breathing  Post-op Assessment: Report given to RN, Post -op Vital signs reviewed and stable and Patient moving all extremities  Post vital signs: Reviewed and stable  Last Vitals:  Vitals Value Taken Time  BP 140/64 06/08/19 1226  Temp 36.4 C 06/08/19 1225  Pulse 63 06/08/19 1229  Resp 13 06/08/19 1229  SpO2 99 % 06/08/19 1229  Vitals shown include unvalidated device data.  Last Pain:  Vitals:   06/08/19 1225  TempSrc:   PainSc: 0-No pain      Patients Stated Pain Goal: 3 (90/24/09 7353)  Complications: No apparent anesthesia complications

## 2019-06-14 DIAGNOSIS — N2581 Secondary hyperparathyroidism of renal origin: Secondary | ICD-10-CM | POA: Diagnosis not present

## 2019-06-14 DIAGNOSIS — N189 Chronic kidney disease, unspecified: Secondary | ICD-10-CM | POA: Diagnosis not present

## 2019-06-14 DIAGNOSIS — D631 Anemia in chronic kidney disease: Secondary | ICD-10-CM | POA: Diagnosis not present

## 2019-06-14 DIAGNOSIS — I120 Hypertensive chronic kidney disease with stage 5 chronic kidney disease or end stage renal disease: Secondary | ICD-10-CM | POA: Diagnosis not present

## 2019-06-14 DIAGNOSIS — N185 Chronic kidney disease, stage 5: Secondary | ICD-10-CM | POA: Diagnosis not present

## 2019-06-20 ENCOUNTER — Inpatient Hospital Stay: Payer: Medicare HMO | Admitting: Adult Health

## 2019-06-20 ENCOUNTER — Telehealth: Payer: Self-pay

## 2019-06-20 ENCOUNTER — Inpatient Hospital Stay: Payer: Medicare HMO

## 2019-06-20 NOTE — Telephone Encounter (Signed)
Attempted to call regarding today's missed appts. Letter mailed out to reschedule appts.

## 2019-06-22 ENCOUNTER — Other Ambulatory Visit: Payer: Medicare HMO

## 2019-06-22 ENCOUNTER — Ambulatory Visit: Payer: Medicare HMO | Admitting: Adult Health

## 2019-06-28 ENCOUNTER — Telehealth: Payer: Self-pay | Admitting: Hematology and Oncology

## 2019-06-28 NOTE — Telephone Encounter (Signed)
Called pt per 5/5 schedule message . No answer . Left message for patient to call back for apt

## 2019-07-11 ENCOUNTER — Other Ambulatory Visit: Payer: Self-pay | Admitting: *Deleted

## 2019-07-11 DIAGNOSIS — N185 Chronic kidney disease, stage 5: Secondary | ICD-10-CM

## 2019-07-12 ENCOUNTER — Inpatient Hospital Stay: Payer: Medicare HMO | Attending: Hematology and Oncology | Admitting: Adult Health

## 2019-07-12 ENCOUNTER — Other Ambulatory Visit: Payer: Self-pay

## 2019-07-12 ENCOUNTER — Inpatient Hospital Stay: Payer: Medicare HMO

## 2019-07-12 VITALS — BP 150/82 | HR 66 | Temp 98.1°F | Resp 18 | Ht 64.0 in | Wt 123.9 lb

## 2019-07-12 DIAGNOSIS — R5383 Other fatigue: Secondary | ICD-10-CM | POA: Insufficient documentation

## 2019-07-12 DIAGNOSIS — I129 Hypertensive chronic kidney disease with stage 1 through stage 4 chronic kidney disease, or unspecified chronic kidney disease: Secondary | ICD-10-CM | POA: Insufficient documentation

## 2019-07-12 DIAGNOSIS — Z862 Personal history of diseases of the blood and blood-forming organs and certain disorders involving the immune mechanism: Secondary | ICD-10-CM

## 2019-07-12 DIAGNOSIS — N189 Chronic kidney disease, unspecified: Secondary | ICD-10-CM

## 2019-07-12 DIAGNOSIS — Z1231 Encounter for screening mammogram for malignant neoplasm of breast: Secondary | ICD-10-CM

## 2019-07-12 DIAGNOSIS — D631 Anemia in chronic kidney disease: Secondary | ICD-10-CM | POA: Insufficient documentation

## 2019-07-12 DIAGNOSIS — E538 Deficiency of other specified B group vitamins: Secondary | ICD-10-CM | POA: Insufficient documentation

## 2019-07-12 DIAGNOSIS — N185 Chronic kidney disease, stage 5: Secondary | ICD-10-CM

## 2019-07-12 LAB — CBC WITH DIFFERENTIAL (CANCER CENTER ONLY)
Abs Immature Granulocytes: 0.01 10*3/uL (ref 0.00–0.07)
Basophils Absolute: 0 10*3/uL (ref 0.0–0.1)
Basophils Relative: 0 %
Eosinophils Absolute: 0.3 10*3/uL (ref 0.0–0.5)
Eosinophils Relative: 7 %
HCT: 23.5 % — ABNORMAL LOW (ref 36.0–46.0)
Hemoglobin: 7.2 g/dL — ABNORMAL LOW (ref 12.0–15.0)
Immature Granulocytes: 0 %
Lymphocytes Relative: 36 %
Lymphs Abs: 1.8 10*3/uL (ref 0.7–4.0)
MCH: 29.5 pg (ref 26.0–34.0)
MCHC: 30.6 g/dL (ref 30.0–36.0)
MCV: 96.3 fL (ref 80.0–100.0)
Monocytes Absolute: 0.4 10*3/uL (ref 0.1–1.0)
Monocytes Relative: 8 %
Neutro Abs: 2.5 10*3/uL (ref 1.7–7.7)
Neutrophils Relative %: 49 %
Platelet Count: 86 10*3/uL — ABNORMAL LOW (ref 150–400)
RBC: 2.44 MIL/uL — ABNORMAL LOW (ref 3.87–5.11)
RDW: 13.6 % (ref 11.5–15.5)
WBC Count: 5.1 10*3/uL (ref 4.0–10.5)
nRBC: 0 % (ref 0.0–0.2)

## 2019-07-12 LAB — CMP (CANCER CENTER ONLY)
ALT: <6 U/L (ref 0–44)
AST: 8 U/L — ABNORMAL LOW (ref 15–41)
Albumin: 2.8 g/dL — ABNORMAL LOW (ref 3.5–5.0)
Alkaline Phosphatase: 44 U/L (ref 38–126)
Anion gap: 11 (ref 5–15)
BUN: 74 mg/dL — ABNORMAL HIGH (ref 8–23)
CO2: 18 mmol/L — ABNORMAL LOW (ref 22–32)
Calcium: 7.9 mg/dL — ABNORMAL LOW (ref 8.9–10.3)
Chloride: 113 mmol/L — ABNORMAL HIGH (ref 98–111)
Creatinine: 7.33 mg/dL (ref 0.44–1.00)
GFR, Est AFR Am: 6 mL/min — ABNORMAL LOW (ref >60)
GFR, Estimated: 5 mL/min — ABNORMAL LOW (ref >60)
Glucose, Bld: 92 mg/dL (ref 70–99)
Potassium: 4.6 mmol/L (ref 3.5–5.1)
Sodium: 142 mmol/L (ref 135–145)
Total Bilirubin: 0.2 mg/dL — ABNORMAL LOW (ref 0.3–1.2)
Total Protein: 5.9 g/dL — ABNORMAL LOW (ref 6.5–8.1)

## 2019-07-12 LAB — VITAMIN B12: Vitamin B-12: 576 pg/mL (ref 180–914)

## 2019-07-12 LAB — SAVE SMEAR(SSMR), FOR PROVIDER SLIDE REVIEW

## 2019-07-12 LAB — FOLATE: Folate: 7.1 ng/mL (ref >5.9)

## 2019-07-12 MED ORDER — EPOETIN ALFA-EPBX 40000 UNIT/ML IJ SOLN
INTRAMUSCULAR | Status: AC
  Filled 2019-07-12: qty 1

## 2019-07-12 MED ORDER — EPOETIN ALFA-EPBX 40000 UNIT/ML IJ SOLN
40000.0000 [IU] | Freq: Once | INTRAMUSCULAR | Status: AC
  Administered 2019-07-12: 40000 [IU] via SUBCUTANEOUS

## 2019-07-12 NOTE — Progress Notes (Signed)
[facility] Follow up:    [name], MD [address] [place] [address]   DIAGNOSIS: anemia secondary to chronic kidney disease  SUMMARY OF HEMATOLOGIC HISTORY:  History of anemia due to chronic kidney disease 04/25/2018: Hemoglobin 9.8, MCV 89, platelets 129 08/09/2018: Hemoglobin 9.8, MCV 90.4, platelets 142 11/16/2018:Hemoglobin 7, MCV 94.7, platelets 188 01/02/2019: Hemoglobin 8.4, MCV 91.8, platelets 93 01/30/2019: Hemoglobin 11.8, platelets 125 02/13/2019: Hemoglobin 11.6, platelets 113  Bone marrow biopsy: 01/20/2019: Hypercellular bone marrow 50 to 60% for age with trilineage hematopoiesis, several small lymphoid aggregates present. Mild dyspoietic changes involving the red cells. No increase in blasts. Confirmed that these changes are related to chronic kidney disease. Flow cytometry: Negative, platelet clumping noted suggestive of pseudothrombocytopenia.  Current treatment: 1.B12 injections monthly,began in 05/2017 2.Retacrit injections every 4 weeks if hemoglobin drops below 10, began in 10/2017  CURRENT THERAPY:  Retacrit, B12  INTERVAL HISTORY: Bailey [name] 69 y.o. female returns for evaluation of her anemia of chronic kidney disease.  She recently underwent a left arm fistula placement so that she can soon begin dialysis.  She is fatigued today.  She continues on retacrit with good tolerance.  She is slightly fatigued.  She denies any other new issues.     Patient Active Problem List   Diagnosis Date Noted  . CKD (chronic kidney disease) 05/23/2019  . History of anemia due to chronic kidney disease 10/11/2017  . Vitamin B 12 deficiency 06/09/2017  . Great toe amputation status, right 09/04/2016  . Osteomyelitis of toe of right foot ([place])   . Type II diabetes mellitus, uncontrolled ([place])   . Acute renal failure (ARF) ([place]) 05/10/2016  . SBO (small bowel obstruction) ([place]) 05/08/2016  . Diverticular disease of  jejunum s/p resection 05/09/2016 05/08/2016  . UTI (urinary tract infection) 05/06/2016  . Dehydration   . Normocytic anemia 05/05/2016    is allergic to no known allergies.  MEDICAL HISTORY: Past Medical History:  Diagnosis Date  . Anemia    patient preference - stopped iron   . ARF (acute renal failure) ([place]) 04/2016   dehydration  . Chronic kidney disease    stage 5  . Depression   . Diabetes mellitus    Type II - pt preference - stopped lantus  . Diabetic retinopathy ([place])   . Diverticulitis   . History of kidney stones    passed- 6  . Hypertension   . Osteomyelitis ([place])   . Vitamin B 12 deficiency 06/09/2017  . Vitamin D deficiency   . Wears partial dentures    upper    SURGICAL HISTORY: Past Surgical History:  Procedure Laterality Date  . AMPUTATION Right 08/14/2016   Procedure: RIGHT 1ST RAY AMPUTATION MID-SHAFT;  Surgeon: Newt Minion, MD;  Location: [facility];  Service: Orthopedics;  Laterality: Right;  . BASCILIC VEIN TRANSPOSITION Left 06/08/2019   Procedure: LEFT BRACHIOCEPHALIC VEIN CREATION;  Surgeon: Marty Heck, MD;  Location: [facility];  Service: Vascular;  Laterality: Left;  . INCISION AND DRAINAGE ABSCESS Left 02/11/2014   Procedure: INCISION AND DRAINAGE ABSCESS Left Buttock;  Surgeon: Jackolyn Confer, MD;  Location: [facility];  Service: General;  Laterality: Left;  . LAPAROSCOPIC SMALL BOWEL RESECTION N/A 05/09/2016   Procedure: LAPAROSCOPIC SMALL BOWEL RESECTION;  Surgeon: Michael Boston, MD;  Location: [facility];  Service: General;  Laterality: N/A;  . LAPAROSCOPY N/A 05/09/2016   Procedure: LAPAROSCOPY DIAGNOSTIC, LYSIS OF ADHESIONS, SMALL BOWEL RESECTION  X 2;  Surgeon: Michael Boston, MD;  Location: [facility];  Service: General;  Laterality: N/A;  . TOE AMPUTATION     left foot great toe    SOCIAL HISTORY: Social History   Socioeconomic History  . Marital status: Married    Spouse name: Not on file  . Number of children: Not on file  . Years of  education: Not on file  . Highest education level: Not on file  Occupational History  . Not on file  Tobacco Use  . Smoking status: Never Smoker  . Smokeless tobacco: Never Used  Substance and Sexual Activity  . Alcohol use: No    Alcohol/week: 0.0 standard drinks  . Drug use: No  . Sexual activity: Not on file  Other Topics Concern  . Not on file  Social History Narrative  . Not on file   Social Determinants of Health   Financial Resource Strain:   . Difficulty of Paying Living Expenses:   Food Insecurity:   . Worried About Charity fundraiser in the Last Year:   . Arboriculturist in the Last Year:   Transportation Needs:   . Film/video editor (Medical):   Marland Kitchen Lack of Transportation (Non-Medical):   Physical Activity:   . Days of Exercise per Week:   . Minutes of Exercise per Session:   Stress:   . Feeling of Stress :   Social Connections:   . Frequency of Communication with Friends and Family:   . Frequency of Social Gatherings with Friends and Family:   . Attends Religious Services:   . Active Member of Clubs or Organizations:   . Attends Archivist Meetings:   Marland Kitchen Marital Status:   Intimate Partner Violence:   . Fear of Current or Ex-Partner:   . Emotionally Abused:   Marland Kitchen Physically Abused:   . Sexually Abused:     FAMILY HISTORY: Family History  Problem Relation Age of Onset  . Diabetes Mother   . Asthma Sister     Review of Systems  Constitutional: Positive for fatigue. Negative for appetite change, chills, fever and unexpected weight change.  HENT:   Negative for hearing loss, lump/mass and sore throat.   Eyes: Negative for eye problems and icterus.  Respiratory: Negative for chest tightness, cough and shortness of breath.   Cardiovascular: Negative for chest pain, leg swelling and palpitations.  Gastrointestinal: Negative for abdominal distention, abdominal pain, constipation, diarrhea, nausea and vomiting.  Endocrine: Negative for hot  flashes.  Musculoskeletal: Negative for arthralgias.  Skin: Negative for itching and rash.  Neurological: Negative for dizziness, extremity weakness, headaches and numbness.  Hematological: Negative for adenopathy. Does not bruise/bleed easily.  Psychiatric/Behavioral: Negative for depression. The patient is not nervous/anxious.       PHYSICAL EXAMINATION  ECOG PERFORMANCE STATUS: 1 - Symptomatic but completely ambulatory  Vitals:   07/12/19 1527 07/12/19 1600  BP: 134/60 (!) 150/82  Pulse: 63 66  Resp: 18 18  Temp: 98.3 F (36.8 C) 98.1 F (36.7 C)  SpO2: 100% 99%    Physical Exam Constitutional:      General: She is not in acute distress.    Appearance: Normal appearance. She is not toxic-appearing.  HENT:     Head: Normocephalic and atraumatic.  Eyes:     Pupils: Pupils are equal, round, and reactive to light.  Cardiovascular:     Rate and Rhythm: Normal rate and regular rhythm.     Pulses: Normal pulses.  Heart sounds: Normal heart sounds.  Pulmonary:     Effort: Pulmonary effort is normal.     Breath sounds: Normal breath sounds.  Abdominal:     General: Abdomen is flat. Bowel sounds are normal.     Palpations: Abdomen is soft.  Musculoskeletal:        General: No swelling.     Cervical back: Neck supple.  Lymphadenopathy:     Cervical: No cervical adenopathy.  Skin:    General: Skin is warm and dry.     Capillary Refill: Capillary refill takes less than 2 seconds.  Neurological:     General: No focal deficit present.     Mental Status: She is alert.  Psychiatric:        Mood and Affect: Mood normal.        Behavior: Behavior normal.     LABORATORY DATA:  CBC    Component Value Date/Time   WBC 5.1 07/12/2019 1507   WBC 5.6 01/20/2019 0940   RBC 2.44 (L) 07/12/2019 1507   HGB 7.2 (L) 07/12/2019 1507   HCT 23.5 (L) 07/12/2019 1507   PLT 86 (L) 07/12/2019 1507   MCV 96.3 07/12/2019 1507   MCV 89.6 02/10/2014 1127   MCH 29.5 07/12/2019 1507    MCHC 30.6 07/12/2019 1507   RDW 13.6 07/12/2019 1507   LYMPHSABS 1.8 07/12/2019 1507   MONOABS 0.4 07/12/2019 1507   EOSABS 0.3 07/12/2019 1507   BASOSABS 0.0 07/12/2019 1507    CMP     Component Value Date/Time   NA 142 07/12/2019 1507   K 4.6 07/12/2019 1507   CL 113 (H) 07/12/2019 1507   CO2 18 (L) 07/12/2019 1507   GLUCOSE 92 07/12/2019 1507   BUN 74 (H) 07/12/2019 1507   CREATININE 7.33 (HH) 07/12/2019 1507   CREATININE 0.74 02/10/2014 1220   CALCIUM 7.9 (L) 07/12/2019 1507   PROT 5.9 (L) 07/12/2019 1507   ALBUMIN 2.8 (L) 07/12/2019 1507   AST 8 (L) 07/12/2019 1507   ALT <6 07/12/2019 1507   ALKPHOS 44 07/12/2019 1507   BILITOT 0.2 (L) 07/12/2019 1507   GFRNONAA 5 (L) 07/12/2019 1507   GFRAA 6 (L) 07/12/2019 1507       ASSESSMENT and PLAN:   History of anemia due to chronic kidney disease 04/25/2018: Hemoglobin 9.8, MCV 89, platelets 129 08/09/2018: Hemoglobin 9.8, MCV 90.4, platelets 142 11/16/2018:Hemoglobin 7, MCV 94.7, platelets 188 01/02/2019: Hemoglobin 8.4, MCV 91.8, platelets 93 01/30/2019: Hemoglobin 11.8, platelets 125 02/13/2019: Hemoglobin 11.6, platelets 113  Bone marrow biopsy: 01/20/2019: Hypercellular bone marrow 50 to 60% for age with trilineage hematopoiesis, several small lymphoid aggregates present. Mild dyspoietic changes involving the red cells. No increase in blasts. Confirmed that these changes are related to chronic kidney disease. Flow cytometry: Negative, platelet clumping noted suggestive of pseudothrombocytopenia.  Current treatment: 1.B12 injections monthly, 2.Retacrit injections every 4 weeks.    Brentley will receive B12 and Retacrit today.  As her renal function continues to worsen, she is preparing to start dialysis.  She and I discussed that her anemia may be best managed by her nephrologist, particularly the decision to give her blood.  She will talk to her nephrologist about future blood transfusions.    We will  continue to f/u with her every 4 weeks until she gets started with dialysis.     All questions were answered. The patient knows to call the clinic with any problems, questions or concerns. We can certainly see the patient  much sooner if necessary.  Total encounter time: 20 minutes*   , NP 07/13/19 8:24 PM Medical Oncology and Hematology [facility] [address] [place], [place] [address] Tel. [phone]    Fax. [phone]  *Total Encounter Time as defined by the Centers for Medicare and Medicaid Services includes, in addition to the face-to-face time of a patient visit (documented in the note above) non-face-to-face time: obtaining and reviewing outside history, ordering and reviewing medications, tests or procedures, care coordination (communications with other health care professionals or caregivers) and documentation in the medical record.  

## 2019-07-12 NOTE — Progress Notes (Signed)
Lab called critical creatinine 7.33, provider made aware.

## 2019-07-13 ENCOUNTER — Telehealth: Payer: Self-pay | Admitting: Adult Health

## 2019-07-13 ENCOUNTER — Encounter: Payer: Self-pay | Admitting: Adult Health

## 2019-07-13 LAB — IRON AND TIBC
Iron: 26 ug/dL — ABNORMAL LOW (ref 41–142)
Saturation Ratios: 19 % — ABNORMAL LOW (ref 21–57)
TIBC: 139 ug/dL — ABNORMAL LOW (ref 236–444)
UIBC: 113 ug/dL — ABNORMAL LOW (ref 120–384)

## 2019-07-13 LAB — FERRITIN: Ferritin: 304 ng/mL (ref 11–307)

## 2019-07-13 NOTE — Assessment & Plan Note (Signed)
04/25/2018: Hemoglobin 9.8, MCV 89, platelets 129 08/09/2018: Hemoglobin 9.8, MCV 90.4, platelets 142 11/16/2018:Hemoglobin 7, MCV 94.7, platelets 188 01/02/2019: Hemoglobin 8.4, MCV 91.8, platelets 93 01/30/2019: Hemoglobin 11.8, platelets 125 02/13/2019: Hemoglobin 11.6, platelets 113  Bone marrow biopsy: 01/20/2019: Hypercellular bone marrow 50 to 60% for age with trilineage hematopoiesis, several small lymphoid aggregates present. Mild dyspoietic changes involving the red cells. No increase in blasts. Confirmed that these changes are related to chronic kidney disease. Flow cytometry: Negative, platelet clumping noted suggestive of pseudothrombocytopenia.  Current treatment: 1.B12 injections monthly, 2.Retacrit injections every 4 weeks.    Bailey [name] will receive B12 and Retacrit today.  As her renal function continues to worsen, she is preparing to start dialysis.  She and I discussed that her anemia may be best managed by her nephrologist, particularly the decision to give her blood.  She will talk to her nephrologist about future blood transfusions.    We will continue to f/u with her every 4 weeks until she gets started with dialysis.

## 2019-07-13 NOTE — Telephone Encounter (Signed)
Scheduled appts per 5/19 los. Pt's spouse confirmed appt date and time.

## 2019-07-18 ENCOUNTER — Ambulatory Visit (INDEPENDENT_AMBULATORY_CARE_PROVIDER_SITE_OTHER): Payer: Self-pay | Admitting: Physician Assistant

## 2019-07-18 ENCOUNTER — Ambulatory Visit (HOSPITAL_COMMUNITY)
Admission: RE | Admit: 2019-07-18 | Discharge: 2019-07-18 | Disposition: A | Discharge status: Home / Self Care | Payer: Medicare HMO | Source: Ambulatory Visit | Attending: Surgery | Admitting: Surgery

## 2019-07-18 ENCOUNTER — Other Ambulatory Visit: Payer: Self-pay

## 2019-07-18 ENCOUNTER — Encounter (HOSPITAL_COMMUNITY): Payer: Medicare HMO

## 2019-07-18 VITALS — BP 160/74 | HR 64 | Temp 97.0°F | Resp 16 | Ht 64.0 in | Wt 122.2 lb

## 2019-07-18 DIAGNOSIS — N185 Chronic kidney disease, stage 5: Secondary | ICD-10-CM

## 2019-07-18 NOTE — Progress Notes (Signed)
  POST OPERATIVE OFFICE NOTE    CC:  F/u for surgery  HPI:  This is a 69 y.o. female who is s/p left BC AVF on 06/08/2019 by Dr. Carlis Abbott.  She comes in today for follow up.    She states that she does not have any pain or numbness in her left hand.  She states she is nervous about sleeping on his left arm.  She states that her nephrologist Dr. Posey Pronto is pleased with the fistula maturation.   The pt does not have evidence of steal sx. The pt is not on dialysis.  Allergies  Allergen Reactions  . No Known Allergies     Current Outpatient Medications  Medication Sig Dispense Refill  . acetaminophen (TYLENOL) 500 MG tablet Take 500 mg by mouth every 6 (six) hours as needed for moderate pain (pain).    Marland Kitchen amLODipine (NORVASC) 2.5 MG tablet Take 2.5 mg by mouth daily.    . carvedilol (COREG) 25 MG tablet Take 25 mg by mouth 2 (two) times daily with a meal.     . cholecalciferol (VITAMIN D3) 25 MCG (1000 UNIT) tablet Take 1,000 Units by mouth in the morning and at bedtime.    . Cyanocobalamin 1000 MCG/ML KIT Inject 1,000 mcg as directed every 30 (thirty) days.    Marland Kitchen escitalopram (LEXAPRO) 5 MG tablet Take 5 mg by mouth daily as needed (anxiety).    . furosemide (LASIX) 20 MG tablet Take 20 mg by mouth daily.    Marland Kitchen oxyCODONE-acetaminophen (PERCOCET/ROXICET) 5-325 MG tablet Take 1 tablet by mouth every 6 (six) hours as needed. 8 tablet 0  . sodium bicarbonate 650 MG tablet Take 650 mg by mouth 2 (two) times daily.     No current facility-administered medications for this visit.     ROS:  See HPI  Physical Exam:  Today's Vitals   07/18/19 1520  BP: (!) 160/74  Pulse: 64  Resp: 16  Temp: (!) 97 F (36.1 C)  TempSrc: Temporal  SpO2: 100%  Weight: 122 lb 3.2 oz (55.4 kg)  Height: '5\' 4"'$  (1.626 m)   Body mass index is 20.98 kg/m.   Incision:  Well healed Extremities:  There is a palpable left radial pulse.  Motor and sensory are in tact.  There is a thrill/bruit present and the fistula  is easily palpable  Dialysis Duplex on 07/18/2019: Diameter:  0.71cm-1.0cm Depth:  0.22cm-0.42cm   Assessment/Plan:  This is a 68 y.o. female who is s/p:  left BC AVF on 06/08/2019 by Dr. Carlis Abbott  -the pt does not have evidence of steal as her motor and sensory are in tact; her left radial pulse is easily palpable.  -the fistula can be used 09/07/2019 -discussed that fistula will need eventual maintenance at some point or even new access as they do not last forever.   -If pt has tunneled dialysis catheter and the access has been used successfully to the satisfaction of the dialysis center, the tunneled catheter can be removed at their discretion.   -the pt will follow up as needed   Leontine Locket, [facility] [phone]  Clinic MD:  Carlis Abbott

## 2019-07-19 ENCOUNTER — Telehealth: Payer: Self-pay | Admitting: Hematology and Oncology

## 2019-07-19 NOTE — Telephone Encounter (Signed)
Rescheduled appt to 6/22. Provider on PAL. Pt request to see MD no APP. Pt could only come in at 3. Pt aware of appts

## 2019-08-09 ENCOUNTER — Ambulatory Visit: Payer: Medicare HMO

## 2019-08-09 ENCOUNTER — Ambulatory Visit: Payer: Medicare HMO | Admitting: Adult Health

## 2019-08-09 ENCOUNTER — Ambulatory Visit: Payer: Medicare HMO | Admitting: Hematology and Oncology

## 2019-08-09 ENCOUNTER — Other Ambulatory Visit: Payer: Medicare HMO

## 2019-08-09 DIAGNOSIS — N189 Chronic kidney disease, unspecified: Secondary | ICD-10-CM | POA: Diagnosis not present

## 2019-08-09 DIAGNOSIS — N2581 Secondary hyperparathyroidism of renal origin: Secondary | ICD-10-CM | POA: Diagnosis not present

## 2019-08-09 DIAGNOSIS — D631 Anemia in chronic kidney disease: Secondary | ICD-10-CM | POA: Diagnosis not present

## 2019-08-09 DIAGNOSIS — I120 Hypertensive chronic kidney disease with stage 5 chronic kidney disease or end stage renal disease: Secondary | ICD-10-CM | POA: Diagnosis not present

## 2019-08-09 DIAGNOSIS — N185 Chronic kidney disease, stage 5: Secondary | ICD-10-CM | POA: Diagnosis not present

## 2019-08-11 DIAGNOSIS — Z992 Dependence on renal dialysis: Secondary | ICD-10-CM | POA: Diagnosis not present

## 2019-08-11 DIAGNOSIS — N186 End stage renal disease: Secondary | ICD-10-CM | POA: Diagnosis not present

## 2019-08-14 ENCOUNTER — Other Ambulatory Visit: Payer: Self-pay | Admitting: *Deleted

## 2019-08-14 ENCOUNTER — Telehealth: Payer: Self-pay | Admitting: Hematology and Oncology

## 2019-08-14 ENCOUNTER — Telehealth: Payer: Self-pay | Admitting: *Deleted

## 2019-08-14 DIAGNOSIS — Z862 Personal history of diseases of the blood and blood-forming organs and certain disorders involving the immune mechanism: Secondary | ICD-10-CM

## 2019-08-14 NOTE — Telephone Encounter (Signed)
Received VM from pt husband wanting to re scheduling upcoming apts.  Attempt x1 to return call, no answer, LVM to return all to the office.

## 2019-08-14 NOTE — Telephone Encounter (Signed)
Called pt per 6/21 sch message - no answer - left message for patient to call back to reschedule appt.

## 2019-08-15 ENCOUNTER — Inpatient Hospital Stay: Payer: Medicare HMO | Attending: Hematology and Oncology

## 2019-08-15 ENCOUNTER — Inpatient Hospital Stay: Payer: Medicare HMO

## 2019-08-15 ENCOUNTER — Inpatient Hospital Stay: Payer: Medicare HMO | Admitting: Hematology and Oncology

## 2019-08-15 DIAGNOSIS — Z992 Dependence on renal dialysis: Secondary | ICD-10-CM | POA: Diagnosis not present

## 2019-08-15 DIAGNOSIS — N186 End stage renal disease: Secondary | ICD-10-CM | POA: Diagnosis not present

## 2019-08-15 DIAGNOSIS — N2581 Secondary hyperparathyroidism of renal origin: Secondary | ICD-10-CM | POA: Diagnosis not present

## 2019-08-15 NOTE — Assessment & Plan Note (Deleted)
04/25/2018: Hemoglobin 9.8, MCV 89, platelets 129 08/09/2018: Hemoglobin 9.8, MCV 90.4, platelets 142 11/16/2018:Hemoglobin 7, MCV 94.7, platelets 188 01/02/2019: Hemoglobin 8.4, MCV 91.8, platelets 93 01/30/2019: Hemoglobin 11.8, platelets 125 02/13/2019: Hemoglobin 11.6, platelets 113  Bone marrow biopsy: 01/20/2019: Hypercellular bone marrow 50 to 60% for age with trilineage hematopoiesis, several small lymphoid aggregates present. Mild dyspoietic changes involving the red cells. No increase in blasts. Confirmed that these changes are related to chronic kidney disease. Flow cytometry: Negative, platelet clumping noted suggestive of pseudothrombocytopenia.  Current treatment: 1.B12 injections monthly, 2.Retacrit injections every 4 weeks.    With the CKD getting worse she may be starting dialysis soon. Nephrologists may want to manage her anemia with dialysis. She will discuss with her nephrologist and will inform us.

## 2019-08-17 DIAGNOSIS — Z992 Dependence on renal dialysis: Secondary | ICD-10-CM | POA: Diagnosis not present

## 2019-08-17 DIAGNOSIS — N186 End stage renal disease: Secondary | ICD-10-CM | POA: Diagnosis not present

## 2019-08-17 DIAGNOSIS — N2581 Secondary hyperparathyroidism of renal origin: Secondary | ICD-10-CM | POA: Diagnosis not present

## 2019-08-19 ENCOUNTER — Observation Stay (HOSPITAL_COMMUNITY)
Admission: EM | Admit: 2019-08-19 | Discharge: 2019-08-20 | Disposition: A | Discharge status: Home / Self Care | Payer: Medicare HMO | Attending: Family Medicine | Admitting: Family Medicine

## 2019-08-19 ENCOUNTER — Encounter (HOSPITAL_COMMUNITY): Payer: Self-pay | Admitting: Emergency Medicine

## 2019-08-19 ENCOUNTER — Other Ambulatory Visit: Payer: Self-pay

## 2019-08-19 DIAGNOSIS — I10 Essential (primary) hypertension: Secondary | ICD-10-CM

## 2019-08-19 DIAGNOSIS — Z79899 Other long term (current) drug therapy: Secondary | ICD-10-CM | POA: Diagnosis not present

## 2019-08-19 DIAGNOSIS — Y832 Surgical operation with anastomosis, bypass or graft as the cause of abnormal reaction of the patient, or of later complication, without mention of misadventure at the time of the procedure: Secondary | ICD-10-CM | POA: Diagnosis not present

## 2019-08-19 DIAGNOSIS — Z20822 Contact with and (suspected) exposure to covid-19: Secondary | ICD-10-CM | POA: Diagnosis not present

## 2019-08-19 DIAGNOSIS — D649 Anemia, unspecified: Secondary | ICD-10-CM | POA: Diagnosis present

## 2019-08-19 DIAGNOSIS — T8249XA Other complication of vascular dialysis catheter, initial encounter: Secondary | ICD-10-CM | POA: Diagnosis not present

## 2019-08-19 DIAGNOSIS — D696 Thrombocytopenia, unspecified: Secondary | ICD-10-CM | POA: Diagnosis not present

## 2019-08-19 DIAGNOSIS — F329 Major depressive disorder, single episode, unspecified: Secondary | ICD-10-CM | POA: Insufficient documentation

## 2019-08-19 DIAGNOSIS — F419 Anxiety disorder, unspecified: Secondary | ICD-10-CM | POA: Insufficient documentation

## 2019-08-19 DIAGNOSIS — I120 Hypertensive chronic kidney disease with stage 5 chronic kidney disease or end stage renal disease: Secondary | ICD-10-CM | POA: Insufficient documentation

## 2019-08-19 DIAGNOSIS — N2581 Secondary hyperparathyroidism of renal origin: Secondary | ICD-10-CM | POA: Diagnosis not present

## 2019-08-19 DIAGNOSIS — Z992 Dependence on renal dialysis: Secondary | ICD-10-CM | POA: Insufficient documentation

## 2019-08-19 DIAGNOSIS — E11319 Type 2 diabetes mellitus with unspecified diabetic retinopathy without macular edema: Secondary | ICD-10-CM | POA: Insufficient documentation

## 2019-08-19 DIAGNOSIS — E1122 Type 2 diabetes mellitus with diabetic chronic kidney disease: Secondary | ICD-10-CM | POA: Insufficient documentation

## 2019-08-19 DIAGNOSIS — E1165 Type 2 diabetes mellitus with hyperglycemia: Secondary | ICD-10-CM | POA: Insufficient documentation

## 2019-08-19 DIAGNOSIS — N186 End stage renal disease: Secondary | ICD-10-CM

## 2019-08-19 DIAGNOSIS — D62 Acute posthemorrhagic anemia: Secondary | ICD-10-CM | POA: Insufficient documentation

## 2019-08-19 DIAGNOSIS — T829XXA Unspecified complication of cardiac and vascular prosthetic device, implant and graft, initial encounter: Secondary | ICD-10-CM

## 2019-08-19 DIAGNOSIS — D631 Anemia in chronic kidney disease: Secondary | ICD-10-CM | POA: Insufficient documentation

## 2019-08-19 DIAGNOSIS — T82838A Hemorrhage of vascular prosthetic devices, implants and grafts, initial encounter: Secondary | ICD-10-CM | POA: Diagnosis not present

## 2019-08-19 DIAGNOSIS — IMO0002 Reserved for concepts with insufficient information to code with codable children: Secondary | ICD-10-CM | POA: Diagnosis present

## 2019-08-19 LAB — CBC WITH DIFFERENTIAL/PLATELET
Abs Immature Granulocytes: 0.02 10*3/uL (ref 0.00–0.07)
Basophils Absolute: 0 10*3/uL (ref 0.0–0.1)
Basophils Relative: 0 %
Eosinophils Absolute: 0.3 10*3/uL (ref 0.0–0.5)
Eosinophils Relative: 6 %
HCT: 21.1 % — ABNORMAL LOW (ref 36.0–46.0)
Hemoglobin: 6.3 g/dL — CL (ref 12.0–15.0)
Immature Granulocytes: 0 %
Lymphocytes Relative: 30 %
Lymphs Abs: 1.6 10*3/uL (ref 0.7–4.0)
MCH: 29 pg (ref 26.0–34.0)
MCHC: 29.9 g/dL — ABNORMAL LOW (ref 30.0–36.0)
MCV: 97.2 fL (ref 80.0–100.0)
Monocytes Absolute: 0.5 10*3/uL (ref 0.1–1.0)
Monocytes Relative: 9 %
Neutro Abs: 2.9 10*3/uL (ref 1.7–7.7)
Neutrophils Relative %: 55 %
Platelets: 70 10*3/uL — ABNORMAL LOW (ref 150–400)
RBC: 2.17 MIL/uL — ABNORMAL LOW (ref 3.87–5.11)
RDW: 14.4 % (ref 11.5–15.5)
WBC: 5.4 10*3/uL (ref 4.0–10.5)
nRBC: 0 % (ref 0.0–0.2)

## 2019-08-19 LAB — BASIC METABOLIC PANEL
Anion gap: 12 (ref 5–15)
BUN: 18 mg/dL (ref 8–23)
CO2: 29 mmol/L (ref 22–32)
Calcium: 7.9 mg/dL — ABNORMAL LOW (ref 8.9–10.3)
Chloride: 99 mmol/L (ref 98–111)
Creatinine, Ser: 2.81 mg/dL — ABNORMAL HIGH (ref 0.44–1.00)
GFR calc Af Amer: 19 mL/min — ABNORMAL LOW (ref >60)
GFR calc non Af Amer: 16 mL/min — ABNORMAL LOW (ref >60)
Glucose, Bld: 102 mg/dL — ABNORMAL HIGH (ref 70–99)
Potassium: 3.7 mmol/L (ref 3.5–5.1)
Sodium: 140 mmol/L (ref 135–145)

## 2019-08-19 LAB — PROTIME-INR
INR: 1.1 (ref 0.8–1.2)
Prothrombin Time: 14 seconds (ref 11.4–15.2)

## 2019-08-19 MED ORDER — CARVEDILOL 25 MG PO TABS
25.0000 mg | ORAL_TABLET | Freq: Two times a day (BID) | ORAL | Status: DC
  Administered 2019-08-20 (×2): 25 mg via ORAL
  Filled 2019-08-19: qty 1
  Filled 2019-08-19: qty 2

## 2019-08-19 MED ORDER — SODIUM CHLORIDE 0.9 % IV SOLN: 10.0000 mL/h | Freq: Once | INTRAVENOUS | Status: DC

## 2019-08-19 NOTE — ED Notes (Signed)
Pt is on purewick, supplies were placed at the bedside for MD to check hickman cath

## 2019-08-19 NOTE — ED Provider Notes (Signed)
[facility] DEPT Provider Note   CSN: [id number] Arrival date & time: 08/19/19  2004     History Chief Complaint  Patient presents with  . Vascular Access Problem    Bailey [name] is a 69 y.o. female.  HPI Dr. Posey Pronto of nephrology placed hemodialysis catheter right anterior chest Friday a week ago.  Patient's husband reports that she has a fistula but it is not ready for use.  The patient end up needing dialysis sooner than expected due to volume overload.  Patient's husband reports that they had problems with bleeding on the first day the catheter was placed.  With the assistance of a nurse who was a friend they were able to redress it and compress and control bleeding.  Patient then went on to dialysis on Tuesday and had another large bleeding episode.  Again with dressing changes and compression it did abate.  After today's session, bleeding persisted and patient reports it filled all of her bra.  They tried redressing it at the dialysis center but ultimately did not get it to stop bleeding completely.  Patient and her husband were told to go to the emergency department if it continued to bleed.  They report has soaked through her clothes several times and patient reports that she feels extremely weak and fatigued.  She reports there is some pain in the anterior chest.  Not severe.  No fevers.  she continues to have some swelling of the legs and the feet.    Past Medical History:  Diagnosis Date  . Anemia    patient preference - stopped iron   . ARF (acute renal failure) ([place]) 04/2016   dehydration  . Chronic kidney disease    stage 5  . Depression   . Diabetes mellitus    Type II - pt preference - stopped lantus  . Diabetic retinopathy ([place])   . Diverticulitis   . History of kidney stones    passed- 6  . Hypertension   . Osteomyelitis ([place])   . Vitamin B 12 deficiency 06/09/2017  . Vitamin D deficiency   . Wears partial dentures    upper     Patient Active Problem List   Diagnosis Date Noted  . CKD (chronic kidney disease) 05/23/2019  . History of anemia due to chronic kidney disease 10/11/2017  . Vitamin B 12 deficiency 06/09/2017  . Great toe amputation status, right 09/04/2016  . Osteomyelitis of toe of right foot ([place])   . Type II diabetes mellitus, uncontrolled ([place])   . Acute renal failure (ARF) ([place]) 05/10/2016  . SBO (small bowel obstruction) ([place]) 05/08/2016  . Diverticular disease of jejunum s/p resection 05/09/2016 05/08/2016  . UTI (urinary tract infection) 05/06/2016  . Dehydration   . Normocytic anemia 05/05/2016    Past Surgical History:  Procedure Laterality Date  . AMPUTATION Right 08/14/2016   Procedure: RIGHT 1ST RAY AMPUTATION MID-SHAFT;  Surgeon: Newt Minion, MD;  Location: [facility];  Service: Orthopedics;  Laterality: Right;  . BASCILIC VEIN TRANSPOSITION Left 06/08/2019   Procedure: LEFT BRACHIOCEPHALIC VEIN CREATION;  Surgeon: Marty Heck, MD;  Location: [facility];  Service: Vascular;  Laterality: Left;  . INCISION AND DRAINAGE ABSCESS Left 02/11/2014   Procedure: INCISION AND DRAINAGE ABSCESS Left Buttock;  Surgeon: Jackolyn Confer, MD;  Location: [facility];  Service: General;  Laterality: Left;  . LAPAROSCOPIC SMALL BOWEL RESECTION N/A 05/09/2016   Procedure: LAPAROSCOPIC SMALL BOWEL RESECTION;  Surgeon: Michael Boston, MD;  Location: [facility];  Service: General;  Laterality: N/A;  . LAPAROSCOPY N/A 05/09/2016   Procedure: LAPAROSCOPY DIAGNOSTIC, LYSIS OF ADHESIONS, SMALL BOWEL RESECTION X 2;  Surgeon: Michael Boston, MD;  Location: [facility];  Service: General;  Laterality: N/A;  . TOE AMPUTATION     left foot great toe     OB History   No obstetric history on file.     Family History  Problem Relation Age of Onset  . Diabetes Mother   . Asthma Sister     Social History   Tobacco Use  . Smoking status: Never Smoker  . Smokeless tobacco: Never Used  Vaping Use  . Vaping Use: Never used   Substance Use Topics  . Alcohol use: No    Alcohol/week: 0.0 standard drinks  . Drug use: No    Home Medications Prior to Admission medications   Medication Sig Start Date End Date Taking? Authorizing Provider  acetaminophen (TYLENOL) 500 MG tablet Take 500 mg by mouth every 6 (six) hours as needed for moderate pain (pain).    [provider]  amLODipine (NORVASC) 2.5 MG tablet Take 2.5 mg by mouth daily. 04/12/19   [provider]  carvedilol (COREG) 25 MG tablet Take 25 mg by mouth 2 (two) times daily with a meal.     [provider]  cholecalciferol (VITAMIN D3) 25 MCG (1000 UNIT) tablet Take 1,000 Units by mouth in the morning and at bedtime.    [provider]  Cyanocobalamin 1000 MCG/ML KIT Inject 1,000 mcg as directed every 30 (thirty) days.    [provider]  escitalopram (LEXAPRO) 5 MG tablet Take 5 mg by mouth daily as needed (anxiety).    [provider]  furosemide (LASIX) 20 MG tablet Take 20 mg by mouth daily.    [provider]  oxyCODONE-acetaminophen (PERCOCET/ROXICET) 5-325 MG tablet Take 1 tablet by mouth every 6 (six) hours as needed. 06/08/19   Ulyses Amor, PA-C  sodium bicarbonate 650 MG tablet Take 650 mg by mouth 2 (two) times daily.    [provider]    Allergies    No known allergies  Review of Systems   Review of Systems 10 systems reviewed and negative except as per HPI. Physical Exam Updated Vital Signs BP (!) 179/75   Pulse 69   Temp 98.8 F (37.1 C) (Oral)   Resp 16   Ht 5' 3"  (1.6 m)   Wt 59 kg   LMP  (LMP Unknown)   SpO2 97%   BMI 23.03 kg/m   Physical Exam Constitutional:      Comments: Patient is alert.  No respiratory distress.  Pale and deconditioned in appearance.  HENT:     Head: Normocephalic and atraumatic.  Eyes:     Extraocular Movements: Extraocular movements intact.  Cardiovascular:     Rate and Rhythm: Normal rate and regular rhythm.  Pulmonary:      Effort: Pulmonary effort is normal.     Breath sounds: Normal breath sounds.     Comments: Dialysis catheter right anterior chest wall.  Once dressing removed, no active bleeding.  Sutures are in place.  Catheter is tunneled.  Clot right at the insertion but no active bleeding or oozing. Abdominal:     General: There is no distension.     Palpations: Abdomen is soft.     Tenderness: There is no guarding.  Musculoskeletal:     Comments: 1-2+ edema bilateral feet and ankles.  Calf soft and nontender.  Skin:    General: Skin is warm and dry.     Coloration: Skin is pale.  Neurological:     General: No focal deficit present.     Mental Status: She is oriented to person, place, and time.     Coordination: Coordination normal.  Psychiatric:        Mood and Affect: Mood normal.     ED Results / Procedures / Treatments   Labs (all labs ordered are listed, but only abnormal results are displayed) Labs Reviewed  BASIC METABOLIC PANEL - Abnormal; Notable for the following components:      Result Value   Glucose, Bld 102 (*)    Creatinine, Ser 2.81 (*)    Calcium 7.9 (*)    GFR calc non Af Amer 16 (*)    GFR calc Af Amer 19 (*)    All other components within normal limits  CBC WITH DIFFERENTIAL/PLATELET - Abnormal; Notable for the following components:   RBC 2.17 (*)    Hemoglobin 6.3 (*)    HCT 21.1 (*)    MCHC 29.9 (*)    Platelets 70 (*)    All other components within normal limits  PROTIME-INR  TYPE AND SCREEN  PREPARE RBC (CROSSMATCH)    EKG None  Radiology No results found.  Procedures Procedures (including critical care time) Sterile gloves applied.  Dressing removed and catheter examined.  There is clot right at the catheter insertion but no active bleeding.  Surgicel placed over catheter insertion.  Tegaderm placed over Surgicel.  Gauzes placed for light compression.  No further bleeding at this time. Medications Ordered in ED Medications  0.9 %  sodium  chloride infusion (0 mL/hr Intravenous Hold 08/19/19 2258)    ED Course  I have reviewed the triage vital signs and the nursing notes.  Pertinent labs & imaging results that were available during my care of the patient were reviewed by me and considered in my medical decision making (see chart for details).    MDM Rules/Calculators/A&P                          Patient had baseline anemia with ESRD.  She has had several described large amounts of bleeding with her recently placed vascular access catheter for dialysis.  Hemoglobin 6.3.  Patient reports that she feels extremely weak and fatigued.  She is not having ongoing bleeding at this time the catheter has been examined.  She does have increased bleeding risk due to thrombocytopenia as well.  We will plan to transfuse 2 units of blood for symptomatic anemia.  Plan to admit for transfusion and observation for wound care. Final Clinical Impression(s) / ED Diagnoses Final diagnoses:  Symptomatic anemia  ESRD (end stage renal disease) ([place])  Complication of vascular access for dialysis, initial encounter    Rx / DC Orders ED Discharge Orders    None       Charlesetta Shanks, MD 08/19/19 2346

## 2019-08-19 NOTE — ED Triage Notes (Signed)
Patient reports dialysis port placed on 6/23. States noticed blood from port saturating shirt after dialysis today. Reports continued bleeding from site.

## 2019-08-19 NOTE — ED Notes (Signed)
Pt states that she needs to void.  Placed pt on pure wick

## 2019-08-20 DIAGNOSIS — D62 Acute posthemorrhagic anemia: Secondary | ICD-10-CM | POA: Diagnosis not present

## 2019-08-20 DIAGNOSIS — E1165 Type 2 diabetes mellitus with hyperglycemia: Secondary | ICD-10-CM | POA: Diagnosis not present

## 2019-08-20 DIAGNOSIS — D649 Anemia, unspecified: Secondary | ICD-10-CM | POA: Diagnosis not present

## 2019-08-20 DIAGNOSIS — N186 End stage renal disease: Secondary | ICD-10-CM

## 2019-08-20 DIAGNOSIS — Y832 Surgical operation with anastomosis, bypass or graft as the cause of abnormal reaction of the patient, or of later complication, without mention of misadventure at the time of the procedure: Secondary | ICD-10-CM | POA: Diagnosis not present

## 2019-08-20 DIAGNOSIS — E1122 Type 2 diabetes mellitus with diabetic chronic kidney disease: Secondary | ICD-10-CM | POA: Diagnosis not present

## 2019-08-20 DIAGNOSIS — Z79899 Other long term (current) drug therapy: Secondary | ICD-10-CM | POA: Diagnosis not present

## 2019-08-20 DIAGNOSIS — T82838A Hemorrhage of vascular prosthetic devices, implants and grafts, initial encounter: Secondary | ICD-10-CM | POA: Diagnosis not present

## 2019-08-20 DIAGNOSIS — F329 Major depressive disorder, single episode, unspecified: Secondary | ICD-10-CM | POA: Diagnosis not present

## 2019-08-20 DIAGNOSIS — D696 Thrombocytopenia, unspecified: Secondary | ICD-10-CM

## 2019-08-20 DIAGNOSIS — Z20822 Contact with and (suspected) exposure to covid-19: Secondary | ICD-10-CM | POA: Diagnosis not present

## 2019-08-20 DIAGNOSIS — I10 Essential (primary) hypertension: Secondary | ICD-10-CM

## 2019-08-20 DIAGNOSIS — D631 Anemia in chronic kidney disease: Secondary | ICD-10-CM | POA: Diagnosis not present

## 2019-08-20 DIAGNOSIS — F419 Anxiety disorder, unspecified: Secondary | ICD-10-CM | POA: Diagnosis not present

## 2019-08-20 DIAGNOSIS — I120 Hypertensive chronic kidney disease with stage 5 chronic kidney disease or end stage renal disease: Secondary | ICD-10-CM | POA: Diagnosis not present

## 2019-08-20 DIAGNOSIS — E11319 Type 2 diabetes mellitus with unspecified diabetic retinopathy without macular edema: Secondary | ICD-10-CM | POA: Diagnosis not present

## 2019-08-20 DIAGNOSIS — Z992 Dependence on renal dialysis: Secondary | ICD-10-CM | POA: Diagnosis not present

## 2019-08-20 LAB — CBC WITH DIFFERENTIAL/PLATELET
Abs Immature Granulocytes: 0.02 10*3/uL (ref 0.00–0.07)
Basophils Absolute: 0 10*3/uL (ref 0.0–0.1)
Basophils Relative: 1 %
Eosinophils Absolute: 0.3 10*3/uL (ref 0.0–0.5)
Eosinophils Relative: 5 %
HCT: 23.4 % — ABNORMAL LOW (ref 36.0–46.0)
Hemoglobin: 7.4 g/dL — ABNORMAL LOW (ref 12.0–15.0)
Immature Granulocytes: 0 %
Lymphocytes Relative: 25 %
Lymphs Abs: 1.4 10*3/uL (ref 0.7–4.0)
MCH: 29.2 pg (ref 26.0–34.0)
MCHC: 31.6 g/dL (ref 30.0–36.0)
MCV: 92.5 fL (ref 80.0–100.0)
Monocytes Absolute: 0.4 10*3/uL (ref 0.1–1.0)
Monocytes Relative: 7 %
Neutro Abs: 3.5 10*3/uL (ref 1.7–7.7)
Neutrophils Relative %: 62 %
Platelets: 65 10*3/uL — ABNORMAL LOW (ref 150–400)
RBC: 2.53 MIL/uL — ABNORMAL LOW (ref 3.87–5.11)
RDW: 15.6 % — ABNORMAL HIGH (ref 11.5–15.5)
WBC: 5.7 10*3/uL (ref 4.0–10.5)
nRBC: 0 % (ref 0.0–0.2)

## 2019-08-20 LAB — BASIC METABOLIC PANEL
Anion gap: 13 (ref 5–15)
BUN: 19 mg/dL (ref 8–23)
CO2: 27 mmol/L (ref 22–32)
Calcium: 7.8 mg/dL — ABNORMAL LOW (ref 8.9–10.3)
Chloride: 98 mmol/L (ref 98–111)
Creatinine, Ser: 3.26 mg/dL — ABNORMAL HIGH (ref 0.44–1.00)
GFR calc Af Amer: 16 mL/min — ABNORMAL LOW (ref >60)
GFR calc non Af Amer: 14 mL/min — ABNORMAL LOW (ref >60)
Glucose, Bld: 115 mg/dL — ABNORMAL HIGH (ref 70–99)
Potassium: 3.8 mmol/L (ref 3.5–5.1)
Sodium: 138 mmol/L (ref 135–145)

## 2019-08-20 LAB — TYPE AND SCREEN
ABO/RH(D): A NEG
Antibody Screen: NEGATIVE

## 2019-08-20 LAB — HEMOGLOBIN A1C
Hgb A1c MFr Bld: 5.5 % (ref 4.8–5.6)
Mean Plasma Glucose: 111.15 mg/dL

## 2019-08-20 LAB — MAGNESIUM: Magnesium: 1.8 mg/dL (ref 1.7–2.4)

## 2019-08-20 LAB — HEMOGLOBIN AND HEMATOCRIT, BLOOD
HCT: 24.6 % — ABNORMAL LOW (ref 36.0–46.0)
Hemoglobin: 8 g/dL — ABNORMAL LOW (ref 12.0–15.0)

## 2019-08-20 LAB — PREPARE RBC (CROSSMATCH)

## 2019-08-20 LAB — ABO/RH
ABO/RH(D): A NEG
ABO/RH(D): A NEG

## 2019-08-20 LAB — SARS CORONAVIRUS 2 BY RT PCR (HOSPITAL ORDER, PERFORMED IN ~~LOC~~ HOSPITAL LAB): SARS Coronavirus 2: NEGATIVE

## 2019-08-20 LAB — PHOSPHORUS: Phosphorus: 4 mg/dL (ref 2.5–4.6)

## 2019-08-20 MED ORDER — SODIUM BICARBONATE 650 MG PO TABS
650.0000 mg | ORAL_TABLET | Freq: Two times a day (BID) | ORAL | Status: DC
  Administered 2019-08-20 (×2): 650 mg via ORAL
  Filled 2019-08-20 (×2): qty 1

## 2019-08-20 MED ORDER — ESCITALOPRAM OXALATE 10 MG PO TABS: 5.0000 mg | ORAL_TABLET | Freq: Every day | ORAL | Status: DC | PRN

## 2019-08-20 MED ORDER — ACETAMINOPHEN 325 MG PO TABS: 650.0000 mg | ORAL_TABLET | Freq: Four times a day (QID) | ORAL | Status: DC | PRN

## 2019-08-20 MED ORDER — AMLODIPINE BESYLATE 5 MG PO TABS
2.5000 mg | ORAL_TABLET | Freq: Every day | ORAL | Status: DC
  Administered 2019-08-20: 2.5 mg via ORAL
  Filled 2019-08-20: qty 1

## 2019-08-20 MED ORDER — CARVEDILOL 12.5 MG PO TABS: 25.0000 mg | ORAL_TABLET | Freq: Two times a day (BID) | ORAL | Status: DC

## 2019-08-20 MED ORDER — ACETAMINOPHEN 650 MG RE SUPP: 650.0000 mg | Freq: Four times a day (QID) | RECTAL | Status: DC | PRN

## 2019-08-20 MED ORDER — FUROSEMIDE 20 MG PO TABS
20.0000 mg | ORAL_TABLET | Freq: Every day | ORAL | Status: DC
  Administered 2019-08-20: 20 mg via ORAL
  Filled 2019-08-20: qty 1

## 2019-08-20 MED ORDER — CARVEDILOL 25 MG PO TABS: 25.0000 mg | ORAL_TABLET | Freq: Two times a day (BID) | ORAL | Status: DC

## 2019-08-20 NOTE — ED Notes (Signed)
Call to blood bank to check on blood.  They are working on it

## 2019-08-20 NOTE — Progress Notes (Signed)
DISCHARGE NOTE HOME MARBELLA MARKGRAF to be discharged Home per MD order. Discussed prescriptions and follow up appointments with the patient. Prescriptions given to patient; medication list explained in detail. Patient verbalized understanding.  IV catheter discontinued intact. Site without signs and symptoms of complications. Dressing and pressure applied. Pt denies pain at the site currently. No complaints noted.  An After Visit Summary (AVS) was printed and given to the patient. Patient escorted via wheelchair, and discharged home via private auto.  Orville Govern, RN3

## 2019-08-20 NOTE — Discharge Summary (Signed)
Physician Discharge Summary  Bailey [name] SWN:[id number] DOB: 04-Oct-1950 DOA: 08/19/2019  PCP: [name], MD  Admit date: 08/19/2019 Discharge date: 08/20/2019  Admitted From: Home Disposition: Home  Recommendations for Outpatient Follow-up:  1. Follow up with PCP in 1-2 weeks 2. Please obtain BMP/CBC in one week 3. Please follow up with your PCP on the following pending results: Unresulted Labs (From admission, onward) Comment          Start     Ordered   08/20/19 0118  HIV Antibody (routine testing w rflx)  (HIV Antibody (Routine testing w reflex) panel)  Once,   STAT        08/20/19 0123           Home Health: None Equipment/Devices: None  Discharge Condition: Stable CODE STATUS: Full code Diet recommendation: Renal and cardiac  Subjective: Patient seen and examined.  She has no complaints.  Excited to hear that she will be discharged today.   HPI: Bailey [name] is a 69 y.o. female with medical history significant of ESRD newly started on dialysis, type 2 diabetes, anemia due to CKD, and HTN. Last Friday her nephrologist, Dr. Posey Pronto, placed a hemodialysis catheter to the right anterior chest due to need for dialysis for volume overload. Her fistula had not matured enough to use at that time. When she got home that night it started to leak/bleed. They were able to bandage it up and stop the bleeding. She had dialysis for the first time on Tuesday and she had some more bleeding from her fistula after this. They went to see a nurse at the dialysis center on Wednesday and they bandaged her up. When she went to dialysis on Thursday she had more bleeding from her catheter, but were able to dress it and stop it. Today when she got home from dialysis around 3:30pm the catheter continued to bleed and it filled her bra. They couldn't get the bleeding to stop so they came in to the ER. She denies being short of breath, light headed or dizzy. She has gained about 3 pounds in about 2  weeks. Denies any excessive swelling in her legs. She denies any fever/chills, coughing. She does endorse some back pain from lying in the bed.   She has had her covid vaccines.    ED Course: patient alert and in no distress in ER. Bleeding had stopped at catheter sight while in ED. Labs significant for hgb down to 6.3 and platelets of 70. Creatinine at 2.81. vitals: Blood pressure (!) 185/68, pulse 68, temperature 98.8 F (37.1 C), temperature source Oral, resp. rate 16, height 5' 3"  (1.6 m), weight 59 kg, SpO2 98 %. She had not taken her blood pressure medication for day and given in Er tonight. She was type and crossed for 2 units of blood.   Brief/Interim Summary: Patient was admitted due to bleeding from right anterior chest HD catheter.  Bleeding was stopped in the emergency department with pressure application.  Her hemoglobin was 6.4.  She was transfused 1 unit of PRBC and posttransfusion her hemoglobin jumped from 6.4-8.0.  Patient's bleeding has stopped and she has not had any further bleeding episodes since last more than 14 hours.  I discussed her case with on-call nephrologist/Dr. Hollie Salk who had opined that since the bleeding has stopped and her next dialysis session is not due until Tuesday so there is nothing that nephrology will do and she was okay with patient going home.  She  will convey with patient's outpatient dialysis/Dr. Posey Pronto to not give her any heparin at dialysis to prevent future bleeding episodes.  She is being discharged in stable condition.  Discharge Diagnoses:  Active Problems:   Type II diabetes mellitus, uncontrolled ([place])   ESRD on dialysis ([place])   Benign essential HTN   Acute on chronic anemia   Thrombocytopenia ([facility])    Discharge Instructions   Allergies as of 08/20/2019      Reactions   No Known Allergies       Medication List    TAKE these medications   acetaminophen 500 MG tablet Commonly known as: TYLENOL Take 500 mg by mouth every 6 (six)  hours as needed for moderate pain (pain).   amLODipine 2.5 MG tablet Commonly known as: NORVASC Take 2.5 mg by mouth daily.   carvedilol 25 MG tablet Commonly known as: COREG Take 25 mg by mouth 2 (two) times daily with a meal.   cholecalciferol 25 MCG (1000 UNIT) tablet Commonly known as: VITAMIN D3 Take 1,000 Units by mouth in the morning and at bedtime.   Cyanocobalamin 1000 MCG/ML Kit Inject 1,000 mcg as directed every 30 (thirty) days.   losartan 100 MG tablet Commonly known as: COZAAR Take 100 mg by mouth daily.   NIFEdipine 30 MG 24 hr tablet Commonly known as: PROCARDIA-XL/NIFEDICAL-XL Take 30 mg by mouth daily.   sodium bicarbonate 650 MG tablet Take 650 mg by mouth 2 (two) times daily.   Vitamin D (Ergocalciferol) 1.25 MG (50000 UNIT) Caps capsule Commonly known as: DRISDOL Take 50,000 Units by mouth every 7 (seven) days.       Follow-up Information    [name], MD Follow up in 1 week(s).   Specialty: Family Medicine Contact information: [address] [place] [address] [phone]              Allergies  Allergen Reactions  . No Known Allergies     Consultations: None, nephrology over the phone   Procedures/Studies:  No results found.   Discharge Exam: Vitals:   08/20/19 0503 08/20/19 0920  BP: (!) 183/58 (!) 175/65  Pulse: 69 71  Resp: 18 16  Temp: 98.7 F (37.1 C) 98.5 F (36.9 C)  SpO2: 94% 94%   Vitals:   08/20/19 0400 08/20/19 0500 08/20/19 0503 08/20/19 0920  BP: (!) 189/69  (!) 183/58 (!) 175/65  Pulse: 67  69 71  Resp: 16  18 16   Temp:   98.7 F (37.1 C) 98.5 F (36.9 C)  TempSrc:   Oral Oral  SpO2: 96%  94% 94%  Weight:  59 kg    Height:        General: Pt is alert, awake, not in acute distress Cardiovascular: RRR, S1/S2 +, no rubs, no gallops Respiratory: CTA bilaterally, no wheezing, no rhonchi Abdominal: Soft, NT, ND, bowel sounds + Extremities: no edema, no cyanosis    The  results of significant diagnostics from this hospitalization (including imaging, microbiology, ancillary and laboratory) are listed below for reference.     Microbiology: Recent Results (from the past 240 hour(s))  SARS Coronavirus 2 by RT PCR (hospital order, performed in [facility] hospital lab) Nasopharyngeal Nasopharyngeal Swab     Status: None   Collection Time: 08/20/19 12:13 AM   Specimen: Nasopharyngeal Swab  Result Value Ref Range Status   SARS Coronavirus 2 NEGATIVE NEGATIVE Final    Comment: (NOTE) SARS-CoV-2 target nucleic acids are NOT DETECTED.  The  SARS-CoV-2 RNA is generally detectable in upper and lower respiratory specimens during the acute phase of infection. The lowest concentration of SARS-CoV-2 viral copies this assay can detect is 250 copies / mL. A negative result does not preclude SARS-CoV-2 infection and should not be used as the sole basis for treatment or other patient management decisions.  A negative result may occur with improper specimen collection / handling, submission of specimen other than nasopharyngeal swab, presence of viral mutation(s) within the areas targeted by this assay, and inadequate number of viral copies (<250 copies / mL). A negative result must be combined with clinical observations, patient history, and epidemiological information.  Fact Sheet for Patients:   StrictlyIdeas.no  Fact Sheet for Healthcare Providers: BankingDealers.co.za  This test is not yet approved or  cleared by the [place] FDA and has been authorized for detection and/or diagnosis of SARS-CoV-2 by FDA under an Emergency Use Authorization (EUA).  This EUA will remain in effect (meaning this test can be used) for the duration of the COVID-19 declaration under Section 564(b)(1) of the Act, 21 U.S.C. section 360bbb-3(b)(1), unless the authorization is terminated or revoked sooner.  Performed at [facility], [place] [address], [place], [place] [address]      Labs: BNP (last 3 results) No results for input(s): BNP in the last 8760 hours. Basic Metabolic Panel: Recent Labs  Lab 08/19/19 2112 08/20/19 0827  NA 140 138  K 3.7 3.8  CL 99 98  CO2 29 27  GLUCOSE 102* 115*  BUN 18 19  CREATININE 2.81* 3.26*  CALCIUM 7.9* 7.8*  MG  --  1.8  PHOS  --  4.0   Liver Function Tests: No results for input(s): AST, ALT, ALKPHOS, BILITOT, PROT, ALBUMIN in the last 168 hours. No results for input(s): LIPASE, AMYLASE in the last 168 hours. No results for input(s): AMMONIA in the last 168 hours. CBC: Recent Labs  Lab 08/19/19 2112 08/20/19 0827 08/20/19 1206  WBC 5.4 5.7  --   NEUTROABS 2.9 3.5  --   HGB 6.3* 7.4* 8.0*  HCT 21.1* 23.4* 24.6*  MCV 97.2 92.5  --   PLT 70* 65*  --    Cardiac Enzymes: No results for input(s): CKTOTAL, CKMB, CKMBINDEX, TROPONINI in the last 168 hours. BNP: Invalid input(s): POCBNP CBG: No results for input(s): GLUCAP in the last 168 hours. D-Dimer No results for input(s): DDIMER in the last 72 hours. Hgb A1c Recent Labs    08/20/19 0123  HGBA1C 5.5   Lipid Profile No results for input(s): CHOL, HDL, LDLCALC, TRIG, CHOLHDL, LDLDIRECT in the last 72 hours. Thyroid function studies No results for input(s): TSH, T4TOTAL, T3FREE, THYROIDAB in the last 72 hours.  Invalid input(s): FREET3 Anemia work up No results for input(s): VITAMINB12, FOLATE, FERRITIN, TIBC, IRON, RETICCTPCT in the last 72 hours. Urinalysis    Component Value Date/Time   COLORURINE YELLOW 05/05/2016 2227   APPEARANCEUR HAZY (A) 05/05/2016 2227   LABSPEC 1.028 05/05/2016 2227   PHURINE 5.0 05/05/2016 2227   GLUCOSEU >=500 (A) 05/05/2016 2227   HGBUR MODERATE (A) 05/05/2016 2227   BILIRUBINUR NEGATIVE 05/05/2016 2227   BILIRUBINUR small 02/10/2014 1127   KETONESUR 5 (A) 05/05/2016 2227   PROTEINUR 100 (A) 05/05/2016 2227   UROBILINOGEN 1.0 02/10/2014 2233    NITRITE NEGATIVE 05/05/2016 2227   LEUKOCYTESUR MODERATE (A) 05/05/2016 2227   Sepsis Labs Invalid input(s): PROCALCITONIN,  WBC,  LACTICIDVEN Microbiology Recent Results (from the past 240 hour(s))  SARS Coronavirus 2 by RT PCR (hospital order, performed in [facility] hospital lab) Nasopharyngeal Nasopharyngeal Swab     Status: None   Collection Time: 08/20/19 12:13 AM   Specimen: Nasopharyngeal Swab  Result Value Ref Range Status   SARS Coronavirus 2 NEGATIVE NEGATIVE Final    Comment: (NOTE) SARS-CoV-2 target nucleic acids are NOT DETECTED.  The SARS-CoV-2 RNA is generally detectable in upper and lower respiratory specimens during the acute phase of infection. The lowest concentration of SARS-CoV-2 viral copies this assay can detect is 250 copies / mL. A negative result does not preclude SARS-CoV-2 infection and should not be used as the sole basis for treatment or other patient management decisions.  A negative result may occur with improper specimen collection / handling, submission of specimen other than nasopharyngeal swab, presence of viral mutation(s) within the areas targeted by this assay, and inadequate number of viral copies (<250 copies / mL). A negative result must be combined with clinical observations, patient history, and epidemiological information.  Fact Sheet for Patients:   StrictlyIdeas.no  Fact Sheet for Healthcare Providers: BankingDealers.co.za  This test is not yet approved or  cleared by the [place] FDA and has been authorized for detection and/or diagnosis of SARS-CoV-2 by FDA under an Emergency Use Authorization (EUA).  This EUA will remain in effect (meaning this test can be used) for the duration of the COVID-19 declaration under Section 564(b)(1) of the Act, 21 U.S.C. section 360bbb-3(b)(1), unless the authorization is terminated or revoked sooner.  Performed at [facility], [place] [address], [place], [place] [address]      Time coordinating discharge: Over 30 minutes  SIGNED:   Darliss Cheney, MD  [facility] 08/20/2019, 1:48 PM  If 7PM-7AM, please contact night-coverage [email or web address]

## 2019-08-20 NOTE — ED Notes (Signed)
Admitting MD is at the bedside 

## 2019-08-20 NOTE — H&P (Signed)
History and Physical    Bailey [name] JJK:[id number] DOB: 1950/07/19 DOA: 08/19/2019  PCP: [name], MD   Patient coming from: home   I have personally briefly reviewed patient's old medical records in [place]  Chief Complaint: bleeding from her hemodialysis catheter site.   HPI: Bailey [name] is a 69 y.o. female with medical history significant of ESRD newly started on dialysis, type 2 diabetes, anemia due to CKD, and HTN. Last Friday her nephrologist, Dr. Posey Pronto, placed a hemodialysis catheter to the right anterior chest due to need for dialysis for volume overload. Her fistula had not matured enough to use at that time. When she got home that night it started to leak/bleed. They were able to bandage it up and stop the bleeding. She had dialysis for the first time on Tuesday and she had some more bleeding from her fistula after this. They went to see a nurse at the dialysis center on Wednesday and they bandaged her up. When she went to dialysis on Thursday she had more bleeding from her catheter, but were able to dress it and stop it. Today when she got home from dialysis around 3:30pm the catheter continued to bleed and it filled her bra. They couldn't get the bleeding to stop so they came in to the ER. She denies being short of breath, light headed or dizzy. She has gained about 3 pounds in about 2 weeks. Denies any excessive swelling in her legs. She denies any fever/chills, coughing. She does endorse some back pain from lying in the bed.   She has had her covid vaccines.    ED Course: patient alert and in no distress in ER. Bleeding had stopped at catheter sight while in ED. Labs significant for hgb down to 6.3 and platelets of 70. Creatinine at 2.81. vitals: Blood pressure (!) 185/68, pulse 68, temperature 98.8 F (37.1 C), temperature source Oral, resp. rate 16, height '5\' 3"'$  (1.6 m), weight 59 kg, SpO2 98 %. She had not taken her blood pressure medication for day and  given in Er tonight. She was type and crossed for 2 units of blood.   Review of Systems: As per HPI otherwise all other systems reviewed and are negative.   Past Medical History:  Diagnosis Date  . Anemia    patient preference - stopped iron   . ARF (acute renal failure) ([place]) 04/2016   dehydration  . Chronic kidney disease    stage 5  . Depression   . Diabetes mellitus    Type II - pt preference - stopped lantus  . Diabetic retinopathy ([place])   . Diverticulitis   . History of kidney stones    passed- 6  . Hypertension   . Osteomyelitis ([place])   . Vitamin B 12 deficiency 06/09/2017  . Vitamin D deficiency   . Wears partial dentures    upper    Past Surgical History:  Procedure Laterality Date  . AMPUTATION Right 08/14/2016   Procedure: RIGHT 1ST RAY AMPUTATION MID-SHAFT;  Surgeon: Newt Minion, MD;  Location: [facility];  Service: Orthopedics;  Laterality: Right;  . BASCILIC VEIN TRANSPOSITION Left 06/08/2019   Procedure: LEFT BRACHIOCEPHALIC VEIN CREATION;  Surgeon: Marty Heck, MD;  Location: [facility];  Service: Vascular;  Laterality: Left;  . INCISION AND DRAINAGE ABSCESS Left 02/11/2014   Procedure: INCISION AND DRAINAGE ABSCESS Left Buttock;  Surgeon: Jackolyn Confer, MD;  Location: [facility];  Service: General;  Laterality: Left;  .  LAPAROSCOPIC SMALL BOWEL RESECTION N/A 05/09/2016   Procedure: LAPAROSCOPIC SMALL BOWEL RESECTION;  Surgeon: Karie Soda, MD;  Location: [facility];  Service: General;  Laterality: N/A;  . LAPAROSCOPY N/A 05/09/2016   Procedure: LAPAROSCOPY DIAGNOSTIC, LYSIS OF ADHESIONS, SMALL BOWEL RESECTION X 2;  Surgeon: Karie Soda, MD;  Location: [facility];  Service: General;  Laterality: N/A;  . TOE AMPUTATION     left foot great toe    Social History  reports that she has never smoked. She has never used smokeless tobacco. She reports that she does not drink alcohol and does not use drugs.  Allergies  Allergen Reactions  . No Known Allergies      Family History  Problem Relation Age of Onset  . Diabetes Mother   . Asthma Sister     Prior to Admission medications   Medication Sig Start Date End Date Taking? Authorizing Provider  acetaminophen (TYLENOL) 500 MG tablet Take 500 mg by mouth every 6 (six) hours as needed for moderate pain (pain).    [provider]  amLODipine (NORVASC) 2.5 MG tablet Take 2.5 mg by mouth daily. 04/12/19   [provider]  carvedilol (COREG) 25 MG tablet Take 25 mg by mouth 2 (two) times daily with a meal.     [provider]  cholecalciferol (VITAMIN D3) 25 MCG (1000 UNIT) tablet Take 1,000 Units by mouth in the morning and at bedtime.    [provider]  Cyanocobalamin 1000 MCG/ML KIT Inject 1,000 mcg as directed every 30 (thirty) days.    [provider]  escitalopram (LEXAPRO) 5 MG tablet Take 5 mg by mouth daily as needed (anxiety).    [provider]  furosemide (LASIX) 20 MG tablet Take 20 mg by mouth daily.    [provider]  oxyCODONE-acetaminophen (PERCOCET/ROXICET) 5-325 MG tablet Take 1 tablet by mouth every 6 (six) hours as needed. 06/08/19   Lars Mage, PA-C  sodium bicarbonate 650 MG tablet Take 650 mg by mouth 2 (two) times daily.    [provider]    Physical Exam: Vitals:   08/19/19 2230 08/19/19 2300 08/19/19 2330 08/20/19 0115  BP: (!) 181/71 (!) 195/72 (!) 179/75 (!) 185/68  Pulse: 71 72 69 68  Resp:    16  Temp:      TempSrc:      SpO2: 92% 99% 97% 98%  Weight:      Height:        Constitutional: NAD, calm, comfortable Vitals:   08/19/19 2230 08/19/19 2300 08/19/19 2330 08/20/19 0115  BP: (!) 181/71 (!) 195/72 (!) 179/75 (!) 185/68  Pulse: 71 72 69 68  Resp:    16  Temp:      TempSrc:      SpO2: 92% 99% 97% 98%  Weight:      Height:       Eyes: PERRL, lids and conjunctivae normal ENMT: Mucous membranes are slightly dry. Posterior pharynx clear of any exudate or lesions. dentures.   Neck: normal, supple, no masses, no thyromegaly Respiratory: clear to auscultation bilaterally, no wheezing, no crackles. Normal respiratory effort. No accessory muscle use.  Cardiovascular: Regular rate and rhythm, no murmurs / rubs / gallops. Mild, non pitting edema in lower legs. 2+ pedal pulses. No carotid bruits.  Abdomen: no tenderness, no masses palpated. No hepatosplenomegaly. Bowel sounds positive.  Musculoskeletal: no clubbing / cyanosis. No joint deformity upper and lower extremities. Good ROM, no contractures. Normal muscle tone.  Skin:  no rashes, lesions, ulcers. No induration. Fistula in left antecubital fossa with good thrill. Catheter in right anterior chest wall has a nice clot. Not actively bleeding.  Neurologic: CN 2-12 grossly intact. Sensation intact, DTR normal. Strength 5/5 in all 4.  Psychiatric: Normal judgment and insight. Alert and oriented x 3. Normal mood.   Labs on Admission: I have personally reviewed following labs and imaging studies  CBC: Recent Labs  Lab 08/19/19 2112  WBC 5.4  NEUTROABS 2.9  HGB 6.3*  HCT 21.1*  MCV 97.2  PLT 70*    Basic Metabolic Panel: Recent Labs  Lab 08/19/19 2112  NA 140  K 3.7  CL 99  CO2 29  GLUCOSE 102*  BUN 18  CREATININE 2.81*  CALCIUM 7.9*    GFR: Estimated Creatinine Clearance: 15.4 mL/min (A) (by C-G formula based on SCr of 2.81 mg/dL (H)).  Liver Function Tests: No results for input(s): AST, ALT, ALKPHOS, BILITOT, PROT, ALBUMIN in the last 168 hours.  Urine analysis:    Component Value Date/Time   COLORURINE YELLOW 05/05/2016 2227   APPEARANCEUR HAZY (A) 05/05/2016 2227   LABSPEC 1.028 05/05/2016 2227   PHURINE 5.0 05/05/2016 2227   GLUCOSEU >=500 (A) 05/05/2016 2227   HGBUR MODERATE (A) 05/05/2016 2227   BILIRUBINUR NEGATIVE 05/05/2016 2227   BILIRUBINUR small 02/10/2014 1127   KETONESUR 5 (A) 05/05/2016 2227   PROTEINUR 100 (A) 05/05/2016 2227   UROBILINOGEN 1.0 02/10/2014 2233   NITRITE  NEGATIVE 05/05/2016 2227   LEUKOCYTESUR MODERATE (A) 05/05/2016 2227    Radiological Exams on Admission: No results found.  EKG: n/a.    Assessment/Plan Active Problems:   ESRD on dialysis ([place])   Type II diabetes mellitus, uncontrolled ([place])   Benign essential HTN   Thrombocytopenia ([facility])   Acute on chronic anemia   1) acute on chronic anemia -secondary to ESRD and possibly bleeding from catheter -hgb of 6.3.  Type and cross and transfusing in ER 2 units -she is asymptomatic. Pressure dressing on catheter and not actively bleeding.  -hold anticoagulation  2) ESRD -consult nephrology in the AM -creatinine at 2.81 -mag/phosphorous pending -daily weights and I/os.  -does not appear volume overloaded at this time.  -due for dialysis again next Tuesday  3) thrombocytopenia -platelets of 70 -hold anticoagulation -near baseline. Last platelets of 86 in 06/2019.  -followed by heme with bone biopsy done  -flow cytometry: platelet clumping noted suggestive of pseudothrombocytopenia (12/2018)   4) hypertension -hypertensive in ER. Had not taken home medications -starting back home medication, may need adjustment  5) diabetes type 2 -checking a1c -on no medication -diabetic diet.   DVT prophylaxis: Compression hose  Code Status:   Full   Family Communication:  Husband, Shikira Folino, in room  Disposition Plan:   Patient is from:  home  Anticipated DC to:  home  Anticipated DC date:  1-2 days  Anticipated DC barriers: Possible need for dialysis   Consults called:  None, will need nephrology consulted in AM  Admission status:  observation Severity of Illness: The appropriate patient status for this patient is OBSERVATION. Observation status is judged to be reasonable and necessary in order to provide the required intensity of service to ensure the patient's safety. The patient's presenting symptoms, physical exam findings, and initial radiographic and laboratory data in the  context of their medical condition is felt to place them at decreased risk for further clinical deterioration. Furthermore, it is anticipated that the patient will be medically  stable for discharge from the hospital within 2 midnights of admission. The following factors support the patient status of observation.   " The patient's presenting symptoms bleeding from catheter site. " The initial radiographic and laboratory data are acute on chronic anemia requiring transfusion in setting of ESRD on dialysis .      Orma Flaming MD [facility]  How to contact the [facility] Attending or Consulting provider [place] or covering provider during after hours [place], for this patient?   1. Check the care team in [facility] and look for a) attending/consulting [facility] provider listed and b) the [facility] team listed 2. Log into [email or web address] and use [place]'s universal password to access. If you do not have the password, please contact the hospital operator. 3. Locate the [facility] provider you are looking for under [facility] and page to a number that you can be directly reached. 4. If you still have difficulty reaching the provider, please page the [facility] (Director on Call) for the Hospitalists listed on amion for assistance.  08/20/2019, 1:36 AM

## 2019-08-20 NOTE — ED Notes (Signed)
[facility] called. 

## 2019-08-20 NOTE — Progress Notes (Signed)
New Admission Note:  Arrival Method: By Karen Chafe from [facility] around 0500 Mental Orientation: Alert and oriented x 4 Telemetry: None Assessment: Completed Skin: Completed, refer to flowsheets IV: Right A.C. blood finishing up Pain: 5/10 chronic back pain Tubes: None Safety Measures: Safety Fall Prevention Plan was given, discussed and signed. Admission: Completed [facility] [place] Orientation: Patient has been orientated to the room, unit and the staff. Family: None  Orders have been reviewed and implemented. Will continue to monitor the patient. Call light has been placed within reach and bed alarm has been activated.   Perry Mount, RN  Phone Number: [phone]

## 2019-08-20 NOTE — Discharge Instructions (Signed)
Anemia  Anemia is a condition in which you do not have enough red blood cells or hemoglobin. Hemoglobin is a substance in red blood cells that carries oxygen. When you do not have enough red blood cells or hemoglobin (are anemic), your body cannot get enough oxygen and your organs may not work properly. As a result, you may feel very tired or have other problems. What are the causes? Common causes of anemia include:  Excessive bleeding. Anemia can be caused by excessive bleeding inside or outside the body, including bleeding from the intestine or from periods in women.  Poor nutrition.  Long-lasting (chronic) kidney, thyroid, and liver disease.  Bone marrow disorders.  Cancer and treatments for cancer.  HIV (human immunodeficiency virus) and AIDS (acquired immunodeficiency syndrome).  Treatments for HIV and AIDS.  Spleen problems.  Blood disorders.  Infections, medicines, and autoimmune disorders that destroy red blood cells. What are the signs or symptoms? Symptoms of this condition include:  Minor weakness.  Dizziness.  Headache.  Feeling heartbeats that are irregular or faster than normal (palpitations).  Shortness of breath, especially with exercise.  Paleness.  Cold sensitivity.  Indigestion.  Nausea.  Difficulty sleeping.  Difficulty concentrating. Symptoms may occur suddenly or develop slowly. If your anemia is mild, you may not have symptoms. How is this diagnosed? This condition is diagnosed based on:  Blood tests.  Your medical history.  A physical exam.  Bone marrow biopsy. Your health care provider may also check your stool (feces) for blood and may do additional testing to look for the cause of your bleeding. You may also have other tests, including:  Imaging tests, such as a CT scan or MRI.  Endoscopy.  Colonoscopy. How is this treated? Treatment for this condition depends on the cause. If you continue to lose a lot of blood, you may  need to be treated at a hospital. Treatment may include:  Taking supplements of iron, vitamin S31, or folic acid.  Taking a hormone medicine (erythropoietin) that can help to stimulate red blood cell growth.  Having a blood transfusion. This may be needed if you lose a lot of blood.  Making changes to your diet.  Having surgery to remove your spleen. Follow these instructions at home:  Take over-the-counter and prescription medicines only as told by your health care provider.  Take supplements only as told by your health care provider.  Follow any diet instructions that you were given.  Keep all follow-up visits as told by your health care provider. This is important. Contact a health care provider if:  You develop new bleeding anywhere in the body. Get help right away if:  You are very weak.  You are short of breath.  You have pain in your abdomen or chest.  You are dizzy or feel faint.  You have trouble concentrating.  You have bloody or black, tarry stools.  You vomit repeatedly or you vomit up blood. Summary  Anemia is a condition in which you do not have enough red blood cells or enough of a substance in your red blood cells that carries oxygen (hemoglobin).  Symptoms may occur suddenly or develop slowly.  If your anemia is mild, you may not have symptoms.  This condition is diagnosed with blood tests as well as a medical history and physical exam. Other tests may be needed.  Treatment for this condition depends on the cause of the anemia. This information is not intended to replace advice given to you by  your health care provider. Make sure you discuss any questions you have with your health care provider. Document Revised: 01/22/2017 Document Reviewed: 03/13/2016 Elsevier Patient Education  [place].

## 2019-08-21 ENCOUNTER — Telehealth: Payer: Self-pay | Admitting: Hematology and Oncology

## 2019-08-21 NOTE — Telephone Encounter (Signed)
Scheduled per 6/25 sch message. Unable to reach pt. Left voicemail. R/s appt from 6/22 to 7/21.

## 2019-08-22 DIAGNOSIS — Z992 Dependence on renal dialysis: Secondary | ICD-10-CM | POA: Diagnosis not present

## 2019-08-22 DIAGNOSIS — N2581 Secondary hyperparathyroidism of renal origin: Secondary | ICD-10-CM | POA: Diagnosis not present

## 2019-08-22 DIAGNOSIS — N186 End stage renal disease: Secondary | ICD-10-CM | POA: Diagnosis not present

## 2019-08-23 ENCOUNTER — Encounter: Payer: Self-pay | Admitting: *Deleted

## 2019-08-23 DIAGNOSIS — N186 End stage renal disease: Secondary | ICD-10-CM | POA: Diagnosis not present

## 2019-08-23 DIAGNOSIS — E1129 Type 2 diabetes mellitus with other diabetic kidney complication: Secondary | ICD-10-CM | POA: Diagnosis not present

## 2019-08-23 DIAGNOSIS — Z992 Dependence on renal dialysis: Secondary | ICD-10-CM | POA: Diagnosis not present

## 2019-08-23 LAB — TYPE AND SCREEN
ABO/RH(D): A NEG
Antibody Screen: NEGATIVE
Unit division: 0
Unit division: 0

## 2019-08-23 LAB — BPAM RBC
Blood Product Expiration Date: 202107092359
Blood Product Expiration Date: 202107092359
ISSUE DATE / TIME: 202106270255
Unit Type and Rh: 600
Unit Type and Rh: 600

## 2019-08-23 NOTE — Progress Notes (Signed)
Received call from Lydia with [facility] requesting to give MD update on pt receiving Mircera and IV iron with dialysis. RN states they are unable to administer B12 injections and pt will need to continue receiving B12 injections at the [facility].  RN also requesting MD office notes be faxed to [phone].  RN successfully faxed records. RN will update MD on tx plan with dialysis.

## 2019-08-24 DIAGNOSIS — N2581 Secondary hyperparathyroidism of renal origin: Secondary | ICD-10-CM | POA: Diagnosis not present

## 2019-08-24 DIAGNOSIS — N186 End stage renal disease: Secondary | ICD-10-CM | POA: Diagnosis not present

## 2019-08-24 DIAGNOSIS — Z992 Dependence on renal dialysis: Secondary | ICD-10-CM | POA: Diagnosis not present

## 2019-08-26 DIAGNOSIS — Z992 Dependence on renal dialysis: Secondary | ICD-10-CM | POA: Diagnosis not present

## 2019-08-26 DIAGNOSIS — N2581 Secondary hyperparathyroidism of renal origin: Secondary | ICD-10-CM | POA: Diagnosis not present

## 2019-08-26 DIAGNOSIS — N186 End stage renal disease: Secondary | ICD-10-CM | POA: Diagnosis not present

## 2019-08-29 ENCOUNTER — Other Ambulatory Visit: Payer: Self-pay

## 2019-08-29 ENCOUNTER — Emergency Department (HOSPITAL_COMMUNITY)
Admission: EM | Admit: 2019-08-29 | Discharge: 2019-08-29 | Disposition: A | Discharge status: Home / Self Care | Payer: Medicare HMO | Attending: Emergency Medicine | Admitting: Emergency Medicine

## 2019-08-29 ENCOUNTER — Encounter (HOSPITAL_COMMUNITY): Payer: Self-pay | Admitting: Emergency Medicine

## 2019-08-29 DIAGNOSIS — N186 End stage renal disease: Secondary | ICD-10-CM | POA: Insufficient documentation

## 2019-08-29 DIAGNOSIS — I120 Hypertensive chronic kidney disease with stage 5 chronic kidney disease or end stage renal disease: Secondary | ICD-10-CM | POA: Diagnosis not present

## 2019-08-29 DIAGNOSIS — R531 Weakness: Secondary | ICD-10-CM | POA: Diagnosis not present

## 2019-08-29 DIAGNOSIS — Z992 Dependence on renal dialysis: Secondary | ICD-10-CM | POA: Insufficient documentation

## 2019-08-29 DIAGNOSIS — Y658 Other specified misadventures during surgical and medical care: Secondary | ICD-10-CM | POA: Insufficient documentation

## 2019-08-29 DIAGNOSIS — T82838A Hemorrhage of vascular prosthetic devices, implants and grafts, initial encounter: Secondary | ICD-10-CM | POA: Insufficient documentation

## 2019-08-29 DIAGNOSIS — E1122 Type 2 diabetes mellitus with diabetic chronic kidney disease: Secondary | ICD-10-CM | POA: Diagnosis not present

## 2019-08-29 DIAGNOSIS — Z79899 Other long term (current) drug therapy: Secondary | ICD-10-CM | POA: Diagnosis not present

## 2019-08-29 DIAGNOSIS — N2581 Secondary hyperparathyroidism of renal origin: Secondary | ICD-10-CM | POA: Diagnosis not present

## 2019-08-29 DIAGNOSIS — R5381 Other malaise: Secondary | ICD-10-CM | POA: Diagnosis not present

## 2019-08-29 LAB — BASIC METABOLIC PANEL
Anion gap: 10 (ref 5–15)
BUN: 5 mg/dL — ABNORMAL LOW (ref 8–23)
CO2: 34 mmol/L — ABNORMAL HIGH (ref 22–32)
Calcium: 8 mg/dL — ABNORMAL LOW (ref 8.9–10.3)
Chloride: 98 mmol/L (ref 98–111)
Creatinine, Ser: 1.88 mg/dL — ABNORMAL HIGH (ref 0.44–1.00)
GFR calc Af Amer: 31 mL/min — ABNORMAL LOW (ref >60)
GFR calc non Af Amer: 27 mL/min — ABNORMAL LOW (ref >60)
Glucose, Bld: 176 mg/dL — ABNORMAL HIGH (ref 70–99)
Potassium: 3 mmol/L — ABNORMAL LOW (ref 3.5–5.1)
Sodium: 142 mmol/L (ref 135–145)

## 2019-08-29 LAB — CBC
HCT: 25.2 % — ABNORMAL LOW (ref 36.0–46.0)
Hemoglobin: 7.7 g/dL — ABNORMAL LOW (ref 12.0–15.0)
MCH: 29.6 pg (ref 26.0–34.0)
MCHC: 30.6 g/dL (ref 30.0–36.0)
MCV: 96.9 fL (ref 80.0–100.0)
Platelets: 104 10*3/uL — ABNORMAL LOW (ref 150–400)
RBC: 2.6 MIL/uL — ABNORMAL LOW (ref 3.87–5.11)
RDW: 14.4 % (ref 11.5–15.5)
WBC: 5.8 10*3/uL (ref 4.0–10.5)
nRBC: 0 % (ref 0.0–0.2)

## 2019-08-29 LAB — PROTIME-INR
INR: 1.2 (ref 0.8–1.2)
Prothrombin Time: 14.9 seconds (ref 11.4–15.2)

## 2019-08-29 NOTE — ED Provider Notes (Signed)
[facility] EMERGENCY DEPARTMENT Provider Note   CSN: [id number] Arrival date & time: 08/29/19  1658     History Chief Complaint  Patient presents with   Vascular Access Problem    Bailey [name] is a 69 y.o. female.  HPI   Patient presents to the ED for evaluation of bleeding from her dialysis port.  Patient states she had this placed in her right chest on June 23.  During her dialysis treatment the patient began having bleeding around the dialysis site.  Patient was sent to the ED for further evaluation.  Patient denies any complaints of feeling chest pain or shortness of breath.  She is not having any vomiting or diarrhea.  Medical records indicate the patient's had similar issues with this catheter.  She was actually in the hospital on June 26 because of recurrent bleeding at that catheter site.  Past Medical History:  Diagnosis Date   Anemia    patient preference - stopped iron    ARF (acute renal failure) ([place]) 04/2016   dehydration   Chronic kidney disease    stage 5   Depression    Diabetes mellitus    Type II - pt preference - stopped lantus   Diabetic retinopathy ([place])    Diverticulitis    History of kidney stones    passed- 6   Hypertension    Osteomyelitis ([place])    Vitamin B 12 deficiency 06/09/2017   Vitamin D deficiency    Wears partial dentures    upper    Patient Active Problem List   Diagnosis Date Noted   ESRD on dialysis ([place]) 08/20/2019   Benign essential HTN 08/20/2019   Acute on chronic anemia 08/20/2019   Thrombocytopenia ([place]) 08/20/2019   CKD (chronic kidney disease) 05/23/2019   Type II diabetes mellitus, uncontrolled ([place]) 12/07/2017   History of anemia due to chronic kidney disease 10/11/2017   Vitamin B 12 deficiency 06/09/2017   Great toe amputation status, right 09/04/2016   Osteomyelitis of toe of right foot ([place])    Acute renal failure (ARF) ([place]) 05/10/2016   SBO (small bowel  obstruction) ([place]) 05/08/2016   Diverticular disease of jejunum s/p resection 05/09/2016 05/08/2016   Normocytic anemia 05/05/2016    Past Surgical History:  Procedure Laterality Date   AMPUTATION Right 08/14/2016   Procedure: RIGHT 1ST RAY AMPUTATION MID-SHAFT;  Surgeon: Newt Minion, MD;  Location: [facility];  Service: Orthopedics;  Laterality: Right;   BASCILIC VEIN TRANSPOSITION Left 06/08/2019   Procedure: LEFT BRACHIOCEPHALIC VEIN CREATION;  Surgeon: Marty Heck, MD;  Location: [facility];  Service: Vascular;  Laterality: Left;   INCISION AND DRAINAGE ABSCESS Left 02/11/2014   Procedure: INCISION AND DRAINAGE ABSCESS Left Buttock;  Surgeon: Jackolyn Confer, MD;  Location: [facility];  Service: General;  Laterality: Left;   LAPAROSCOPIC SMALL BOWEL RESECTION N/A 05/09/2016   Procedure: LAPAROSCOPIC SMALL BOWEL RESECTION;  Surgeon: Michael Boston, MD;  Location: [facility];  Service: General;  Laterality: N/A;   LAPAROSCOPY N/A 05/09/2016   Procedure: LAPAROSCOPY DIAGNOSTIC, LYSIS OF ADHESIONS, SMALL BOWEL RESECTION X 2;  Surgeon: Michael Boston, MD;  Location: [facility];  Service: General;  Laterality: N/A;   TOE AMPUTATION     left foot great toe     OB History   No obstetric history on file.     Family History  Problem Relation Age of Onset   Diabetes Mother    Asthma Sister     Social  History   Tobacco Use   Smoking status: Never Smoker   Smokeless tobacco: Never Used  Vaping Use   Vaping Use: Never used  Substance Use Topics   Alcohol use: No    Alcohol/week: 0.0 standard drinks   Drug use: No    Home Medications Prior to Admission medications   Medication Sig Start Date End Date Taking? Authorizing Provider  acetaminophen (TYLENOL) 500 MG tablet Take 500 mg by mouth every 6 (six) hours as needed for moderate pain (pain).    [provider]  amLODipine (NORVASC) 2.5 MG tablet Take 2.5 mg by mouth daily. 04/12/19   [provider]  carvedilol  (COREG) 25 MG tablet Take 25 mg by mouth 2 (two) times daily with a meal.     [provider]  cholecalciferol (VITAMIN D3) 25 MCG (1000 UNIT) tablet Take 1,000 Units by mouth in the morning and at bedtime.    [provider]  Cyanocobalamin 1000 MCG/ML KIT Inject 1,000 mcg as directed every 30 (thirty) days.    [provider]  losartan (COZAAR) 100 MG tablet Take 100 mg by mouth daily. 08/07/19   [provider]  NIFEdipine (PROCARDIA-XL/NIFEDICAL-XL) 30 MG 24 hr tablet Take 30 mg by mouth daily.    [provider]  sodium bicarbonate 650 MG tablet Take 650 mg by mouth 2 (two) times daily.    [provider]  Vitamin D, Ergocalciferol, (DRISDOL) 1.25 MG (50000 UNIT) CAPS capsule Take 50,000 Units by mouth every 7 (seven) days.    [provider]    Allergies    No known allergies  Review of Systems   Review of Systems  All other systems reviewed and are negative.   Physical Exam Updated Vital Signs BP (!) 176/58    Pulse 67    Temp 98.2 F (36.8 C) (Oral)    Resp 16    LMP  (LMP Unknown)    SpO2 100%   Physical Exam Vitals and nursing note reviewed.  Constitutional:      General: She is not in acute distress.    Appearance: She is well-developed.  HENT:     Head: Normocephalic and atraumatic.     Right Ear: External ear normal.     Left Ear: External ear normal.  Eyes:     General: No scleral icterus.       Right eye: No discharge.        Left eye: No discharge.     Conjunctiva/sclera: Conjunctivae normal.  Neck:     Trachea: No tracheal deviation.  Cardiovascular:     Rate and Rhythm: Normal rate and regular rhythm.  Pulmonary:     Effort: Pulmonary effort is normal. No respiratory distress.     Breath sounds: Normal breath sounds. No stridor. No wheezing or rales.  Chest:     Comments: Dialysis catheter in the right anterior superior chest wall, oozing bleeding from around the catheter site, bleeding stopped  with direct pressure. Abdominal:     General: Bowel sounds are normal. There is no distension.     Palpations: Abdomen is soft.     Tenderness: There is no abdominal tenderness. There is no guarding or rebound.  Musculoskeletal:        General: No tenderness.     Cervical back: Neck supple.  Skin:    General: Skin is warm and dry.     Findings: No rash.  Neurological:     Mental Status: She  is alert.     Cranial Nerves: No cranial nerve deficit (no facial droop, extraocular movements intact, no slurred speech).     Sensory: No sensory deficit.     Motor: No abnormal muscle tone or seizure activity.     Coordination: Coordination normal.     ED Results / Procedures / Treatments   Labs (all labs ordered are listed, but only abnormal results are displayed) Labs Reviewed  BASIC METABOLIC PANEL - Abnormal; Notable for the following components:      Result Value   Potassium 3.0 (*)    CO2 34 (*)    Glucose, Bld 176 (*)    BUN 5 (*)    Creatinine, Ser 1.88 (*)    Calcium 8.0 (*)    GFR calc non Af Amer 27 (*)    GFR calc Af Amer 31 (*)    All other components within normal limits  CBC - Abnormal; Notable for the following components:   RBC 2.60 (*)    Hemoglobin 7.7 (*)    HCT 25.2 (*)    Platelets 104 (*)    All other components within normal limits  PROTIME-INR    EKG None  Radiology No results found.  Procedures Procedures (including critical care time) Pressure dressing applied with the use of thrombin pad and combat gauze.  Bleeding controlled.  9:15 PM  Medications Ordered in ED Medications - No data to display  ED Course  I have reviewed the triage vital signs and the nursing notes.  Pertinent labs & imaging results that were available during my care of the patient were reviewed by me and considered in my medical decision making (see chart for details).  Clinical Course as of Aug 28 2113  Tue Aug 29, 2019  1840 Hemoglobin is 7.7.  Similar to previous  values.   [JK]  1934 Blood started to accumulate on dressing again.  Direct pressure being held by ED tech.   [JK]  2000 Dressing removed. No active bleeding.  Fresh clot noted on thrombipad dressing.  Will re apply pressure dressing   [JK]  2109 Pt re examined.  Dressing is clean and dry   [JK]    Clinical Course User Index [JK] Dorie Rank, MD   MDM Rules/Calculators/A&P                          Patient presented to the ED with recurrent bleeding from a dialysis catheter placed in the right subclavian region.  Patient had similar episode where she was admitted to the hospital back on June 26.  Patient once again had bleeding today after dialysis.  There is no brisk pulsatile flow but she did have continuous oozing that we were eventually able to control with a combination of direct pressure, pressure dressing and thrombin pad.  Patient was monitored in the ED for several hours.  The dressing remains clean and dry.  Discussed having her follow-up with Dr. Posey Pronto so that he can evaluate the catheter.  Return as needed for recurrent bleeding. Final Clinical Impression(s) / ED Diagnoses Final diagnoses:  Bleeding due to dialysis catheter placement, initial encounter [facility])    Rx / Old Agency Orders ED Discharge Orders    None       Dorie Rank, MD 08/29/19 2115

## 2019-08-29 NOTE — Discharge Instructions (Addendum)
Keep the dressing on today.  Contact Dr Posey Pronto to let him know that you had another episode of bleeding so he can evaluate the catheter.

## 2019-08-29 NOTE — ED Triage Notes (Signed)
Pt BIB [facility] from dialysis, pt had a dialysis port placed to the right chest on 6/23, during dialysis pt began having bleeding around her dialysis site.

## 2019-08-31 DIAGNOSIS — Z992 Dependence on renal dialysis: Secondary | ICD-10-CM | POA: Diagnosis not present

## 2019-08-31 DIAGNOSIS — N2581 Secondary hyperparathyroidism of renal origin: Secondary | ICD-10-CM | POA: Diagnosis not present

## 2019-08-31 DIAGNOSIS — N186 End stage renal disease: Secondary | ICD-10-CM | POA: Diagnosis not present

## 2019-08-31 DIAGNOSIS — T82898A Other specified complication of vascular prosthetic devices, implants and grafts, initial encounter: Secondary | ICD-10-CM | POA: Diagnosis not present

## 2019-09-02 ENCOUNTER — Other Ambulatory Visit: Payer: Self-pay

## 2019-09-02 ENCOUNTER — Inpatient Hospital Stay (HOSPITAL_COMMUNITY)
Admission: EM | Admit: 2019-09-02 | Discharge: 2019-09-08 | DRG: 314 | Disposition: A | Discharge status: Home / Self Care | Payer: Medicare HMO | Source: Other Acute Inpatient Hospital | Attending: Internal Medicine | Admitting: Internal Medicine

## 2019-09-02 ENCOUNTER — Encounter (HOSPITAL_COMMUNITY): Payer: Self-pay | Admitting: *Deleted

## 2019-09-02 ENCOUNTER — Emergency Department (HOSPITAL_COMMUNITY): Payer: Medicare HMO

## 2019-09-02 DIAGNOSIS — N186 End stage renal disease: Secondary | ICD-10-CM

## 2019-09-02 DIAGNOSIS — R5383 Other fatigue: Secondary | ICD-10-CM | POA: Diagnosis not present

## 2019-09-02 DIAGNOSIS — J9601 Acute respiratory failure with hypoxia: Secondary | ICD-10-CM | POA: Diagnosis not present

## 2019-09-02 DIAGNOSIS — Z95828 Presence of other vascular implants and grafts: Secondary | ICD-10-CM

## 2019-09-02 DIAGNOSIS — E440 Moderate protein-calorie malnutrition: Secondary | ICD-10-CM | POA: Insufficient documentation

## 2019-09-02 DIAGNOSIS — E1129 Type 2 diabetes mellitus with other diabetic kidney complication: Secondary | ICD-10-CM | POA: Diagnosis not present

## 2019-09-02 DIAGNOSIS — J90 Pleural effusion, not elsewhere classified: Secondary | ICD-10-CM | POA: Diagnosis not present

## 2019-09-02 DIAGNOSIS — E876 Hypokalemia: Secondary | ICD-10-CM | POA: Diagnosis present

## 2019-09-02 DIAGNOSIS — Z79899 Other long term (current) drug therapy: Secondary | ICD-10-CM

## 2019-09-02 DIAGNOSIS — E8771 Transfusion associated circulatory overload: Secondary | ICD-10-CM | POA: Diagnosis present

## 2019-09-02 DIAGNOSIS — A419 Sepsis, unspecified organism: Secondary | ICD-10-CM | POA: Diagnosis not present

## 2019-09-02 DIAGNOSIS — I517 Cardiomegaly: Secondary | ICD-10-CM | POA: Diagnosis not present

## 2019-09-02 DIAGNOSIS — I120 Hypertensive chronic kidney disease with stage 5 chronic kidney disease or end stage renal disease: Secondary | ICD-10-CM | POA: Diagnosis not present

## 2019-09-02 DIAGNOSIS — J810 Acute pulmonary edema: Secondary | ICD-10-CM | POA: Diagnosis present

## 2019-09-02 DIAGNOSIS — Z20822 Contact with and (suspected) exposure to covid-19: Secondary | ICD-10-CM | POA: Diagnosis not present

## 2019-09-02 DIAGNOSIS — R7881 Bacteremia: Secondary | ICD-10-CM | POA: Diagnosis not present

## 2019-09-02 DIAGNOSIS — Z6820 Body mass index (BMI) 20.0-20.9, adult: Secondary | ICD-10-CM | POA: Diagnosis not present

## 2019-09-02 DIAGNOSIS — Z833 Family history of diabetes mellitus: Secondary | ICD-10-CM

## 2019-09-02 DIAGNOSIS — Z992 Dependence on renal dialysis: Secondary | ICD-10-CM | POA: Diagnosis not present

## 2019-09-02 DIAGNOSIS — F329 Major depressive disorder, single episode, unspecified: Secondary | ICD-10-CM | POA: Diagnosis present

## 2019-09-02 DIAGNOSIS — Z89412 Acquired absence of left great toe: Secondary | ICD-10-CM

## 2019-09-02 DIAGNOSIS — D649 Anemia, unspecified: Secondary | ICD-10-CM | POA: Diagnosis not present

## 2019-09-02 DIAGNOSIS — N2581 Secondary hyperparathyroidism of renal origin: Secondary | ICD-10-CM | POA: Diagnosis not present

## 2019-09-02 DIAGNOSIS — A4101 Sepsis due to Methicillin susceptible Staphylococcus aureus: Secondary | ICD-10-CM | POA: Diagnosis present

## 2019-09-02 DIAGNOSIS — R531 Weakness: Secondary | ICD-10-CM | POA: Diagnosis not present

## 2019-09-02 DIAGNOSIS — T82898A Other specified complication of vascular prosthetic devices, implants and grafts, initial encounter: Secondary | ICD-10-CM | POA: Diagnosis not present

## 2019-09-02 DIAGNOSIS — Y832 Surgical operation with anastomosis, bypass or graft as the cause of abnormal reaction of the patient, or of later complication, without mention of misadventure at the time of the procedure: Secondary | ICD-10-CM | POA: Diagnosis not present

## 2019-09-02 DIAGNOSIS — J9690 Respiratory failure, unspecified, unspecified whether with hypoxia or hypercapnia: Secondary | ICD-10-CM | POA: Diagnosis not present

## 2019-09-02 DIAGNOSIS — T80211A Bloodstream infection due to central venous catheter, initial encounter: Principal | ICD-10-CM | POA: Diagnosis present

## 2019-09-02 DIAGNOSIS — R509 Fever, unspecified: Secondary | ICD-10-CM

## 2019-09-02 DIAGNOSIS — A4901 Methicillin susceptible Staphylococcus aureus infection, unspecified site: Secondary | ICD-10-CM | POA: Diagnosis not present

## 2019-09-02 DIAGNOSIS — D631 Anemia in chronic kidney disease: Secondary | ICD-10-CM | POA: Diagnosis present

## 2019-09-02 DIAGNOSIS — F419 Anxiety disorder, unspecified: Secondary | ICD-10-CM | POA: Diagnosis present

## 2019-09-02 DIAGNOSIS — Z4901 Encounter for fitting and adjustment of extracorporeal dialysis catheter: Secondary | ICD-10-CM | POA: Diagnosis not present

## 2019-09-02 DIAGNOSIS — D696 Thrombocytopenia, unspecified: Secondary | ICD-10-CM | POA: Diagnosis present

## 2019-09-02 DIAGNOSIS — N185 Chronic kidney disease, stage 5: Secondary | ICD-10-CM | POA: Diagnosis not present

## 2019-09-02 DIAGNOSIS — I358 Other nonrheumatic aortic valve disorders: Secondary | ICD-10-CM | POA: Diagnosis present

## 2019-09-02 DIAGNOSIS — Z09 Encounter for follow-up examination after completed treatment for conditions other than malignant neoplasm: Secondary | ICD-10-CM

## 2019-09-02 DIAGNOSIS — Z825 Family history of asthma and other chronic lower respiratory diseases: Secondary | ICD-10-CM

## 2019-09-02 DIAGNOSIS — Y848 Other medical procedures as the cause of abnormal reaction of the patient, or of later complication, without mention of misadventure at the time of the procedure: Secondary | ICD-10-CM | POA: Diagnosis present

## 2019-09-02 DIAGNOSIS — E1122 Type 2 diabetes mellitus with diabetic chronic kidney disease: Secondary | ICD-10-CM | POA: Diagnosis not present

## 2019-09-02 DIAGNOSIS — E8770 Fluid overload, unspecified: Secondary | ICD-10-CM | POA: Diagnosis not present

## 2019-09-02 DIAGNOSIS — I10 Essential (primary) hypertension: Secondary | ICD-10-CM | POA: Diagnosis present

## 2019-09-02 DIAGNOSIS — I340 Nonrheumatic mitral (valve) insufficiency: Secondary | ICD-10-CM | POA: Diagnosis not present

## 2019-09-02 LAB — COMPREHENSIVE METABOLIC PANEL
ALT: 17 U/L (ref 0–44)
AST: 22 U/L (ref 15–41)
Albumin: 2.5 g/dL — ABNORMAL LOW (ref 3.5–5.0)
Alkaline Phosphatase: 72 U/L (ref 38–126)
Anion gap: 9 (ref 5–15)
BUN: 6 mg/dL — ABNORMAL LOW (ref 8–23)
CO2: 31 mmol/L (ref 22–32)
Calcium: 7.9 mg/dL — ABNORMAL LOW (ref 8.9–10.3)
Chloride: 98 mmol/L (ref 98–111)
Creatinine, Ser: 1.75 mg/dL — ABNORMAL HIGH (ref 0.44–1.00)
GFR calc Af Amer: 34 mL/min — ABNORMAL LOW (ref >60)
GFR calc non Af Amer: 29 mL/min — ABNORMAL LOW (ref >60)
Glucose, Bld: 134 mg/dL — ABNORMAL HIGH (ref 70–99)
Potassium: 3 mmol/L — ABNORMAL LOW (ref 3.5–5.1)
Sodium: 138 mmol/L (ref 135–145)
Total Bilirubin: 0.5 mg/dL (ref 0.3–1.2)
Total Protein: 5.6 g/dL — ABNORMAL LOW (ref 6.5–8.1)

## 2019-09-02 LAB — CBC WITH DIFFERENTIAL/PLATELET
Abs Immature Granulocytes: 0.07 10*3/uL (ref 0.00–0.07)
Basophils Absolute: 0 10*3/uL (ref 0.0–0.1)
Basophils Relative: 0 %
Eosinophils Absolute: 0 10*3/uL (ref 0.0–0.5)
Eosinophils Relative: 0 %
HCT: 19.1 % — ABNORMAL LOW (ref 36.0–46.0)
Hemoglobin: 5.8 g/dL — CL (ref 12.0–15.0)
Immature Granulocytes: 1 %
Lymphocytes Relative: 4 %
Lymphs Abs: 0.3 10*3/uL — ABNORMAL LOW (ref 0.7–4.0)
MCH: 29 pg (ref 26.0–34.0)
MCHC: 30.4 g/dL (ref 30.0–36.0)
MCV: 95.5 fL (ref 80.0–100.0)
Monocytes Absolute: 0.4 10*3/uL (ref 0.1–1.0)
Monocytes Relative: 5 %
Neutro Abs: 7 10*3/uL (ref 1.7–7.7)
Neutrophils Relative %: 90 %
Platelets: DECREASED 10*3/uL (ref 150–400)
RBC: 2 MIL/uL — ABNORMAL LOW (ref 3.87–5.11)
RDW: 14.4 % (ref 11.5–15.5)
WBC: 7.9 10*3/uL (ref 4.0–10.5)
nRBC: 0 % (ref 0.0–0.2)

## 2019-09-02 LAB — TSH: TSH: 3.05 u[IU]/mL (ref 0.350–4.500)

## 2019-09-02 LAB — LACTIC ACID, PLASMA: Lactic Acid, Venous: 1.7 mmol/L (ref 0.5–1.9)

## 2019-09-02 LAB — PHOSPHORUS: Phosphorus: 2.5 mg/dL (ref 2.5–4.6)

## 2019-09-02 LAB — PROTIME-INR
INR: 1.3 — ABNORMAL HIGH (ref 0.8–1.2)
Prothrombin Time: 16 seconds — ABNORMAL HIGH (ref 11.4–15.2)

## 2019-09-02 LAB — PREPARE RBC (CROSSMATCH)

## 2019-09-02 LAB — GLUCOSE, CAPILLARY: Glucose-Capillary: 141 mg/dL — ABNORMAL HIGH (ref 70–99)

## 2019-09-02 LAB — MAGNESIUM: Magnesium: 1.7 mg/dL (ref 1.7–2.4)

## 2019-09-02 LAB — HIV ANTIBODY (ROUTINE TESTING W REFLEX): HIV Screen 4th Generation wRfx: NONREACTIVE

## 2019-09-02 LAB — SARS CORONAVIRUS 2 BY RT PCR (HOSPITAL ORDER, PERFORMED IN ~~LOC~~ HOSPITAL LAB): SARS Coronavirus 2: NEGATIVE

## 2019-09-02 MED ORDER — ACETAMINOPHEN 650 MG RE SUPP: 650.0000 mg | Freq: Four times a day (QID) | RECTAL | Status: DC | PRN

## 2019-09-02 MED ORDER — POTASSIUM CHLORIDE 20 MEQ PO PACK
40.0000 meq | PACK | Freq: Once | ORAL | Status: AC
  Administered 2019-09-03: 40 meq via ORAL
  Filled 2019-09-02: qty 2

## 2019-09-02 MED ORDER — VANCOMYCIN HCL 500 MG/100ML IV SOLN: 500.0000 mg | INTRAVENOUS | Status: DC

## 2019-09-02 MED ORDER — VANCOMYCIN HCL 1250 MG/250ML IV SOLN
1250.0000 mg | Freq: Once | INTRAVENOUS | Status: AC
  Administered 2019-09-02: 1250 mg via INTRAVENOUS
  Filled 2019-09-02: qty 250

## 2019-09-02 MED ORDER — SODIUM CHLORIDE 0.9 % IV SOLN
10.0000 mL/h | Freq: Once | INTRAVENOUS | Status: AC
  Administered 2019-09-02: 10 mL/h via INTRAVENOUS

## 2019-09-02 MED ORDER — ACETAMINOPHEN 325 MG PO TABS
650.0000 mg | ORAL_TABLET | Freq: Four times a day (QID) | ORAL | Status: DC | PRN
  Administered 2019-09-03 – 2019-09-07 (×6): 650 mg via ORAL
  Filled 2019-09-02 (×6): qty 2

## 2019-09-02 MED ORDER — PNEUMOCOCCAL VAC POLYVALENT 25 MCG/0.5ML IJ INJ: 0.5000 mL | INJECTION | INTRAMUSCULAR | Status: DC | PRN

## 2019-09-02 MED ORDER — ACETAMINOPHEN 325 MG PO TABS
650.0000 mg | ORAL_TABLET | Freq: Once | ORAL | Status: AC
  Administered 2019-09-02: 650 mg via ORAL
  Filled 2019-09-02: qty 2

## 2019-09-02 MED ORDER — ONDANSETRON HCL 4 MG PO TABS: 4.0000 mg | ORAL_TABLET | Freq: Four times a day (QID) | ORAL | Status: DC | PRN

## 2019-09-02 MED ORDER — SODIUM CHLORIDE 0.9 % IV SOLN
2.0000 g | INTRAVENOUS | Status: DC
  Administered 2019-09-02: 2 g via INTRAVENOUS
  Filled 2019-09-02: qty 2

## 2019-09-02 MED ORDER — ONDANSETRON HCL 4 MG/2ML IJ SOLN: 4.0000 mg | Freq: Four times a day (QID) | INTRAMUSCULAR | Status: DC | PRN

## 2019-09-02 NOTE — Progress Notes (Signed)
Patient arrived via stretcher to [phone] from ED. Report taken from Maudie Mercury, [place]. Patient is A&OX4. Pupils equal and reactive to light. Moves all four extremeties.  PIV to RFA, flushed and NSL, with good blood return. Patient have AV fistula to Rt arm with +thrill +bruit. Rt chest dialysis cath in place and dressing is soiled. Bilateral great toes amputated. Sacral foam placed. Purewick in place. Patient is on 2L [place]. Right foot has a hard, dry, black diabetic ulcer to the bottom. VSS. Afebrile. Patient has no belongings. Husband took them home from ED. Continuing to monitor patient.

## 2019-09-02 NOTE — ED Provider Notes (Signed)
[facility] EMERGENCY DEPARTMENT Provider Note   CSN: [id number] Arrival date & time: 09/02/19  1547    History Chief Complaint  Patient presents with  . Fever    Bailey [name] is a 68 y.o. female with past medical history significant for anemia, CKD currently on hemodialysis, diabetes, osteomyelitis who presents for evaluation of weakness.  Generalized weakness over the last few days however worsening acutely today.  Seen by MD at dialysis.  Dialysis site to right upper chest looks well-healing without any blood however noted to have anemia on labs.  Could not transfuse her at dialysis.  Has chronic cough however no shortness of breath or chest pain.  She does still make urine however has no dysuria. Denies fever, abdominal pain, diarrhea, dysuria, rashes or lesions.    COVID Vaccine- Recieved both  Left brachiocephalic fistula on 06/08/19 by Dr. Clark  Admitted 08/19/19 for symptomatic anemia  Seen 08/29/19 for bleeding to dialysis Catheter  Present patient has had weakness, chills and is felt cold all day today.  Was seen at dialysis and was assessed by nephrology according to husband and he states her hemoglobin was low. Followed by Dr. Godino for anemia. Has been getting transfused with iron, last Iron transfusion on Thursday.  Per husband they were not able to give blood at dialysis.  History obtained from patient, family room as well as past medical records.  No interpreter used.    HPI     Past Medical History:  Diagnosis Date  . Anemia    patient preference - stopped iron   . ARF (acute renal failure) (HCC) 04/2016   dehydration  . Chronic kidney disease    stage 5  . Depression   . Diabetes mellitus    Type II - pt preference - stopped lantus  . Diabetic retinopathy (HCC)   . Diverticulitis   . History of kidney stones    passed- 6  . Hypertension   . Osteomyelitis (HCC)   . Vitamin B 12 deficiency 06/09/2017  . Vitamin D deficiency   .  Wears partial dentures    upper    Patient Active Problem List   Diagnosis Date Noted  . Hypokalemia 09/02/2019  . Fever of unknown origin 09/02/2019  . ESRD on dialysis (HCC) 08/20/2019  . Benign essential HTN 08/20/2019  . Symptomatic anemia 08/20/2019  . Thrombocytopenia (HCC) 08/20/2019  . CKD (chronic kidney disease) 05/23/2019  . Type II diabetes mellitus, uncontrolled (HCC) 12/07/2017  . History of anemia due to chronic kidney disease 10/11/2017  . Vitamin B 12 deficiency 06/09/2017  . Great toe amputation status, right 09/04/2016  . Osteomyelitis of toe of right foot (HCC)   . Acute renal failure (ARF) (HCC) 05/10/2016  . SBO (small bowel obstruction) (HCC) 05/08/2016  . Diverticular disease of jejunum s/p resection 05/09/2016 05/08/2016  . Normocytic anemia 05/05/2016    Past Surgical History:  Procedure Laterality Date  . AMPUTATION Right 08/14/2016   Procedure: RIGHT 1ST RAY AMPUTATION MID-SHAFT;  Surgeon: Duda, Marcus V, MD;  Location: [facility] OR;  Service: Orthopedics;  Laterality: Right;  . BASCILIC VEIN TRANSPOSITION Left 06/08/2019   Procedure: LEFT BRACHIOCEPHALIC VEIN CREATION;  Surgeon: Clark, Christopher J, MD;  Location: [facility] OR;  Service: Vascular;  Laterality: Left;  . INCISION AND DRAINAGE ABSCESS Left 02/11/2014   Procedure: INCISION AND DRAINAGE ABSCESS Left Buttock;  Surgeon: Todd Rosenbower, MD;  Location: [facility] ORS;  Service: General;  Laterality: Left;  . LAPAROSCOPIC SMALL   BOWEL RESECTION N/A 05/09/2016   Procedure: LAPAROSCOPIC SMALL BOWEL RESECTION;  Surgeon: Steven Gross, MD;  Location: [facility] ORS;  Service: General;  Laterality: N/A;  . LAPAROSCOPY N/A 05/09/2016   Procedure: LAPAROSCOPY DIAGNOSTIC, LYSIS OF ADHESIONS, SMALL BOWEL RESECTION X 2;  Surgeon: Steven Gross, MD;  Location: [facility] ORS;  Service: General;  Laterality: N/A;  . TOE AMPUTATION     left foot great toe     OB History   No obstetric history on file.     Family History  Problem Relation Age  of Onset  . Diabetes Mother   . Asthma Sister     Social History   Tobacco Use  . Smoking status: Never Smoker  . Smokeless tobacco: Never Used  Vaping Use  . Vaping Use: Never used  Substance Use Topics  . Alcohol use: No    Alcohol/week: 0.0 standard drinks  . Drug use: No    Home Medications Prior to Admission medications   Medication Sig Start Date End Date Taking? Authorizing Provider  acetaminophen (TYLENOL) 500 MG tablet Take 500 mg by mouth every 6 (six) hours as needed for moderate pain (pain).    [provider]  amLODipine (NORVASC) 2.5 MG tablet Take 2.5 mg by mouth daily. 04/12/19   [provider]  carvedilol (COREG) 25 MG tablet Take 25 mg by mouth 2 (two) times daily with a meal.     [provider]  cholecalciferol (VITAMIN D3) 25 MCG (1000 UNIT) tablet Take 1,000 Units by mouth in the morning and at bedtime.    [provider]  Cyanocobalamin 1000 MCG/ML KIT Inject 1,000 mcg as directed every 30 (thirty) days.    [provider]  losartan (COZAAR) 100 MG tablet Take 100 mg by mouth daily. 08/07/19   [provider]  NIFEdipine (PROCARDIA-XL/NIFEDICAL-XL) 30 MG 24 hr tablet Take 30 mg by mouth daily.    [provider]  sodium bicarbonate 650 MG tablet Take 650 mg by mouth 2 (two) times daily.    [provider]  Vitamin D, Ergocalciferol, (DRISDOL) 1.25 MG (50000 UNIT) CAPS capsule Take 50,000 Units by mouth every 7 (seven) days.    [provider]    Allergies    No known allergies  Review of Systems   Review of Systems  Constitutional: Positive for activity change and fatigue.  HENT: Negative.   Respiratory: Negative.   Cardiovascular: Negative.   Gastrointestinal: Negative.   Genitourinary: Negative.   Musculoskeletal: Negative.   Skin: Negative.   Neurological: Positive for weakness.  All other systems reviewed and are negative.  Physical Exam Updated Vital Signs BP  (!) 113/54   Pulse 81   Temp 99 F (37.2 C) (Oral)   Resp 18   Ht 5' 3" (1.6 m)   Wt 59 kg   LMP  (LMP Unknown)   SpO2 92%   BMI 23.04 kg/m   Physical Exam Vitals and nursing note reviewed.  Constitutional:      General: She is not in acute distress.    Appearance: She is well-developed. She is not toxic-appearing or diaphoretic.  HENT:     Head: Normocephalic and atraumatic.  Eyes:     Pupils: Pupils are equal, round, and reactive to light.     Comments: Bil Conjunctival pallor  Neck:     Trachea: Trachea and phonation normal.     Comments: No neck stiffness or neck rigidity.  No meningismus Cardiovascular:     Rate   and Rhythm: Normal rate and regular rhythm.     Pulses: Normal pulses.          Radial pulses are 2+ on the right side and 2+ on the left side.       Dorsalis pedis pulses are 2+ on the right side and 2+ on the left side.  Pulmonary:     Effort: Pulmonary effort is normal. No respiratory distress.  Chest:       Comments: Equal rise and fall of chest wall.  Dialysis catheter to right superior chest wall without surrounding edema, erythema, warmth, bleeding or drainage. Abdominal:     General: There is no distension.     Palpations: Abdomen is soft.     Tenderness: There is no abdominal tenderness. There is no guarding or rebound. Negative signs include Murphy's sign and Rovsing's sign.     Hernia: No hernia is present.  Musculoskeletal:        General: Normal range of motion.     Cervical back: Full passive range of motion without pain, normal range of motion and neck supple.       Feet:     Comments: Moves all 4 extremities without difficulty.  Bilateral great toe metatarsal amputation.  Thickened skin to right plantar aspect of foot.  No surrounding erythema, warmth.  Left upper extremity graft with palpable thrill. No overlying skin changes  Feet:     Right foot:     Skin integrity: Dry skin present.     Left foot:     Skin integrity: Dry skin  present.  Skin:    General: Skin is warm and dry.     Comments: No skin breakdown or evidence of cellulitis or infectious process. Pallor  Neurological:     Mental Status: She is alert.     Comments: Sleepy however arousable to voice Cranial nerves II through XII grossly intact Equal handgrip bilaterally Ambulatory to restroom without difficulty    ED Results / Procedures / Treatments   Labs (all labs ordered are listed, but only abnormal results are displayed) Labs Reviewed  COMPREHENSIVE METABOLIC PANEL - Abnormal; Notable for the following components:      Result Value   Potassium 3.0 (*)    Glucose, Bld 134 (*)    BUN 6 (*)    Creatinine, Ser 1.75 (*)    Calcium 7.9 (*)    Total Protein 5.6 (*)    Albumin 2.5 (*)    GFR calc non Af Amer 29 (*)    GFR calc Af Amer 34 (*)    All other components within normal limits  CBC WITH DIFFERENTIAL/PLATELET - Abnormal; Notable for the following components:   RBC 2.00 (*)    Hemoglobin 5.8 (*)    HCT 19.1 (*)    Lymphs Abs 0.3 (*)    All other components within normal limits  PROTIME-INR - Abnormal; Notable for the following components:   Prothrombin Time 16.0 (*)    INR 1.3 (*)    All other components within normal limits  CULTURE, BLOOD (ROUTINE X 2)  CULTURE, BLOOD (ROUTINE X 2)  URINE CULTURE  SARS CORONAVIRUS 2 BY RT PCR (HOSPITAL ORDER, PERFORMED IN [facility])  LACTIC ACID, PLASMA  URINALYSIS, ROUTINE W REFLEX MICROSCOPIC  HIV ANTIBODY (ROUTINE TESTING W REFLEX)  MAGNESIUM  PHOSPHORUS  TSH  COMPREHENSIVE METABOLIC PANEL  CBC  PROTIME-INR  APTT  TYPE AND SCREEN  PREPARE RBC (CROSSMATCH)    EKG EKG   Interpretation  Date/Time:  Saturday September 02 2019 16:10:01 EDT Ventricular Rate:  79 PR Interval:  138 QRS Duration: 86 QT Interval:  420 QTC Calculation: 481 R Axis:   42 Text Interpretation: Normal sinus rhythm Nonspecific ST and T wave abnormality Prolonged QT Abnormal ECG Confirmed by  Fredia Sorrow [id number]) on 09/02/2019 4:36:21 PM   Radiology DG Chest 2 View  Result Date: 09/02/2019 CLINICAL DATA:  Weakness, suspected sepsis EXAM: CHEST - 2 VIEW COMPARISON:  04/13/2010 FINDINGS: Frontal and lateral views of the chest demonstrate right internal jugular dialysis catheter tip in the region of the superior vena cava. Cardiac silhouette is enlarged. There is mild diffuse interstitial prominence without airspace disease, effusion, or pneumothorax. No acute bony abnormalities. IMPRESSION: 1. Mild diffuse interstitial prominence, likely chronic. No superimposed airspace disease. Electronically Signed   By: Randa Ngo M.D.   On: 09/02/2019 17:04    Procedures .Critical Care Performed by: Nettie Elm, PA-C Authorized by: Nettie Elm, PA-C   Critical care provider statement:    Critical care time (minutes):  45   Critical care was necessary to treat or prevent imminent or life-threatening deterioration of the following conditions:  Cardiac failure (Anemia requiring transfusion)   Critical care was time spent personally by me on the following activities:  Discussions with consultants, evaluation of patient's response to treatment, examination of patient, ordering and performing treatments and interventions, ordering and review of laboratory studies, ordering and review of radiographic studies, pulse oximetry, re-evaluation of patient's condition, obtaining history from patient or surrogate and review of old charts   (including critical care time)  Medications Ordered in ED Medications  0.9 %  sodium chloride infusion (has no administration in time range)  acetaminophen (TYLENOL) tablet 650 mg (has no administration in time range)    Or  acetaminophen (TYLENOL) suppository 650 mg (has no administration in time range)  ondansetron (ZOFRAN) tablet 4 mg (has no administration in time range)    Or  ondansetron (ZOFRAN) injection 4 mg (has no administration in time  range)  acetaminophen (TYLENOL) tablet 650 mg (650 mg Oral Given 09/02/19 1610)   ED Course  I have reviewed the triage vital signs and the nursing notes.  Pertinent labs & imaging results that were available during my care of the patient were reviewed by me and considered in my medical decision making (see chart for details).  69 year old end-stage renal failure currently undergoing dialysis presents for evaluation of generalized weakness.  At dialysis earlier today and noted to be anemic.  Has had weakness x2 days.  No chest pain or shortness of breath.  Was found to be febrile here in the ED.  Recently had dialysis graft placed to left upper extremity in April as well as dialysis catheter right upper chest a few weeks ago.  She is not tachycardic, tachypneic or hypoxic.  We did obtain blood cultures given her line access however she does not appear actively septic.  We will hold on broad-spectrum antibiotics.  She has no complaints.  She does have a chronic cough which she states is at baseline.  She did receive both Covid vaccines.  Dialysis graft does not look actively infected she has no other rashes or lesions.  No urinary complaints.  Does have thickened callus which has moderate amount of dirt to her right plantar aspect her this is not appear actively infected.  Just prior prior history of osteomyelitis requiring great toe amputations bilateral lower extremities.  Plan on labs,  imaging and reassess  Labs and imaging personally viewed and interpreted: CBC without leukocytosis, hemoglobin 5.8, significantly lower than her baseline, she denies any melanotic or bright red blood stools.  Hematology states her anemia is due to her chronic kidney disease. Metabolic panel with mild hypokalemia to 3.0, creatinine 1.75, BUN 6 Lactic acid 1.7 Blood cultures pending DG chest x-ray with chronic interstitial prominence, no acute infiltrates, cardiomegaly, pulmonary edema or pneumothorax EKG without STEMI,  nonspecific ST/T changes. Patient denies any chest pain, shortness of breath, diaphoresis  Patient reassessed.  Agrees to transfusion.  Discussed admission for symptomatic anemia.  Patient and family are agreeable to this.  We will obtain Covid testing, has received both vaccines.  Chest x-ray reassuring without evidence of infiltrates.  She does still make urine and is pending a UA.  Unknown source of fever however patient does not appear septic, she has no tachycardia, tachypnea or hypoxia. She has no leukocytosis and lactic acid is 1.7.  Will defer antibiotics to admitting team to see if they want to just follow blood cultures versus broad-spectrum until cultures return. Pending UA however patient denies UTI symptoms, abd soft, no diarrhea or cellulitis on exam. No CP, SOB, hemoptysis to suggest atypical PE. No skin breakdown or MSK pain  to suggest osteomyelitis.  CONSULT with Dr. Pahwani with [facility] who will evaluate patient for admission.  The patient appears reasonably stabilized for admission considering the current resources, flow, and capabilities available in the ED at this time, and I doubt any other E[facility] requiring further screening and/or treatment in the ED prior to admission.  Patient seen and evaluated by attending Dr. Zackowski who agrees with above treatment, plan and disposition.    MDM Rules/Calculators/A&P                           Final Clinical Impression(s) / ED Diagnoses Final diagnoses:  Symptomatic anemia  Hx of arteriovenostomy for renal dialysis (HCC)  Fever, unspecified fever cause    Rx / DC Orders ED Discharge Orders    None       ,  A, PA-C 09/02/19 1749    Zackowski, [name], MD 09/02/19 1911  

## 2019-09-02 NOTE — ED Notes (Signed)
Ask patient for urine sample, pt said she did not need to urinate at this time.

## 2019-09-02 NOTE — H&P (Signed)
History and Physical    Bailey [name] OPC:[id number] DOB: December 17, 1950 DOA: 09/02/2019  PCP: Lewis Moccasin, MD  Patient coming from: Dialysis center  I have personally briefly reviewed patient's old medical records in [facility]  Chief Complaint: Generalized weakness  HPI: Bailey [name] is a 69 y.o. female with medical history significant of ESRD recently started on dialysis (TTS), diet-controlled type 2 diabetes mellitus, anemia of chronic disease, history of osteomyelitis, hypertension came from dialysis with complaint of generalized weakness since few days  Patient's husband at bedside is the historian.  He tells me that patient has been feeling weak, lethargic, pale, cold however her symptoms started getting worse this morning.  She recently admitted on 6/27 with bleeding from dialysis site and received 2 unit PRBC during that admission and discharged home in stable condition on same day.  She presented to ED on 7/6 with recurrent bleeding from dialysis catheter after dialysis which was controlled with direct pressure and pressure dressing along with thrombin pad and discharged home in stable condition.  Patient denies bleeding from dialysis site or bleeding per rectum.  No history of over-the-counter NSAID use.  She is not on anticoagulation.  Reports fever with chills and chronic dry cough however denies sputum production, chest congestion, wheezing, urinary symptoms such as dysuria, hematuria, change in urinary frequency, abdominal pain, diarrhea, joint pain or rashes.  She received COVID-19 vaccine couple of months ago.  She is followed by hematologist Dr. Samson Frederic for anemia.  She receives iron and B12 shots once in a month.  She underwent bone marrow biopsy on 01/20/2019-findings were related to chronic kidney disease.  Flow cytometry was negative.  Platelet clumping noted suggestive of pseudothrombocytopenia.  She lives with her husband at home.  No history of smoking, alcohol,  illicit drug use.  ED Course: Upon arrival to ED patient had fever of 101.8, no leukocytosis, lactic acid: WNL, CMP shows potassium of 3.0, H&H 5.8/19.1 was 7.7/25.24 days ago, COVID-19, UA, urine culture, blood culture: Pending, chest x-ray negative for acute findings.  EDP ordered 2 unit PRBC.  [facility] hospitalist consulted for admission for symptomatic anemia and fever of unknown etiology.  Review of Systems: As per HPI otherwise negative.    Past Medical History:  Diagnosis Date  . Anemia    patient preference - stopped iron   . ARF (acute renal failure) (HCC) 04/2016   dehydration  . Chronic kidney disease    stage 5  . Depression   . Diabetes mellitus    Type II - pt preference - stopped lantus  . Diabetic retinopathy (HCC)   . Diverticulitis   . History of kidney stones    passed- 6  . Hypertension   . Osteomyelitis (HCC)   . Vitamin B 12 deficiency 06/09/2017  . Vitamin D deficiency   . Wears partial dentures    upper    Past Surgical History:  Procedure Laterality Date  . AMPUTATION Right 08/14/2016   Procedure: RIGHT 1ST RAY AMPUTATION MID-SHAFT;  Surgeon: Nadara Mustard, MD;  Location: [facility];  Service: Orthopedics;  Laterality: Right;  . BASCILIC VEIN TRANSPOSITION Left 06/08/2019   Procedure: LEFT BRACHIOCEPHALIC VEIN CREATION;  Surgeon: [name], MD;  Location: [facility] OR;  Service: Vascular;  Laterality: Left;  . INCISION AND DRAINAGE ABSCESS Left 02/11/2014   Procedure: INCISION AND DRAINAGE ABSCESS Left Buttock;  Surgeon: Avel Peace, MD;  Location: [facility] ORS;  Service: General;  Laterality: Left;  . LAPAROSCOPIC SMALL  BOWEL RESECTION N/A 05/09/2016   Procedure: LAPAROSCOPIC SMALL BOWEL RESECTION;  Surgeon: Michael Boston, MD;  Location: [facility] ORS;  Service: General;  Laterality: N/A;  . LAPAROSCOPY N/A 05/09/2016   Procedure: LAPAROSCOPY DIAGNOSTIC, LYSIS OF ADHESIONS, SMALL BOWEL RESECTION X 2;  Surgeon: Michael Boston, MD;  Location: [facility] ORS;  Service: General;   Laterality: N/A;  . TOE AMPUTATION     left foot great toe     reports that she has never smoked. She has never used smokeless tobacco. She reports that she does not drink alcohol and does not use drugs.  Allergies  Allergen Reactions  . No Known Allergies     Family History  Problem Relation Age of Onset  . Diabetes Mother   . Asthma Sister     Prior to Admission medications   Medication Sig Start Date End Date Taking? Authorizing Provider  acetaminophen (TYLENOL) 500 MG tablet Take 500 mg by mouth every 6 (six) hours as needed for moderate pain (pain).    [provider]  amLODipine (NORVASC) 2.5 MG tablet Take 2.5 mg by mouth daily. 04/12/19   [provider]  carvedilol (COREG) 25 MG tablet Take 25 mg by mouth 2 (two) times daily with a meal.     [provider]  cholecalciferol (VITAMIN D3) 25 MCG (1000 UNIT) tablet Take 1,000 Units by mouth in the morning and at bedtime.    [provider]  Cyanocobalamin 1000 MCG/ML KIT Inject 1,000 mcg as directed every 30 (thirty) days.    [provider]  losartan (COZAAR) 100 MG tablet Take 100 mg by mouth daily. 08/07/19   [provider]  NIFEdipine (PROCARDIA-XL/NIFEDICAL-XL) 30 MG 24 hr tablet Take 30 mg by mouth daily.    [provider]  sodium bicarbonate 650 MG tablet Take 650 mg by mouth 2 (two) times daily.    [provider]  Vitamin D, Ergocalciferol, (DRISDOL) 1.25 MG (50000 UNIT) CAPS capsule Take 50,000 Units by mouth every 7 (seven) days.    [provider]    Physical Exam: Vitals:   09/02/19 1552 09/02/19 1602 09/02/19 1658 09/02/19 1704  BP: (!) 126/57   (!) 113/54  Pulse: 81   81  Resp: 16   18  Temp: (!) 101.8 F (38.8 C)  99 F (37.2 C)   TempSrc: Oral  Oral   SpO2: 94%   92%  Weight:  59 kg    Height:  '5\' 3"'$  (1.6 m)      Constitutional: Appears very lethargic, pale, on room air Eyes: PERRL, lids and conjunctivae: Pale ENMT:  Mucous membranes are dry. Posterior pharynx clear of any exudate or lesions.Normal dentition.  Neck: normal, supple, no masses, no thyromegaly Respiratory: clear to auscultation bilaterally, no wheezing, no crackles. Normal respiratory effort. No accessory muscle use.  Cardiovascular: Regular rate and rhythm, systolic murmur/ rubs / gallops. No extremity edema. 2+ pedal pulses. No carotid bruits.  No bleeding or discharge seen from catheter site. Abdomen: no tenderness, no masses palpated. No hepatosplenomegaly. Bowel sounds positive.  Musculoskeletal: no clubbing / cyanosis. No joint deformity upper and lower extremities. Good ROM, no contractures. Normal muscle tone.  Skin: no rashes, lesions, ulcers. No induration Neurologic: Sleepy but arousable.  CN 2-12 grossly intact. Sensation intact, DTR normal. Strength 5/5 in all 4.  Psychiatric: Normal judgment and insight. Alert and oriented x 3. Normal mood.    Labs on Admission: I have personally reviewed following labs and imaging studies  CBC: Recent Labs  Lab 08/29/19 1714 09/02/19 1614  WBC 5.8 7.9  NEUTROABS  --  7.0  HGB 7.7* 5.8*  HCT 25.2* 19.1*  MCV 96.9 95.5  PLT 104* PLATELET CLUMPS NOTED ON SMEAR, COUNT APPEARS DECREASED   Basic Metabolic Panel: Recent Labs  Lab 08/29/19 1714 09/02/19 1614  NA 142 138  K 3.0* 3.0*  CL 98 98  CO2 34* 31  GLUCOSE 176* 134*  BUN 5* 6*  CREATININE 1.88* 1.75*  CALCIUM 8.0* 7.9*   GFR: Estimated Creatinine Clearance: 24.7 mL/min (A) (by C-G formula based on SCr of 1.75 mg/dL (H)). Liver Function Tests: Recent Labs  Lab 09/02/19 1614  AST 22  ALT 17  ALKPHOS 72  BILITOT 0.5  PROT 5.6*  ALBUMIN 2.5*   No results for input(s): LIPASE, AMYLASE in the last 168 hours. No results for input(s): AMMONIA in the last 168 hours. Coagulation Profile: Recent Labs  Lab 08/29/19 1958 09/02/19 1614  INR 1.2 1.3*   Cardiac Enzymes: No results for input(s): CKTOTAL, CKMB, CKMBINDEX,  TROPONINI in the last 168 hours. BNP (last 3 results) No results for input(s): PROBNP in the last 8760 hours. HbA1C: No results for input(s): HGBA1C in the last 72 hours. CBG: No results for input(s): GLUCAP in the last 168 hours. Lipid Profile: No results for input(s): CHOL, HDL, LDLCALC, TRIG, CHOLHDL, LDLDIRECT in the last 72 hours. Thyroid Function Tests: No results for input(s): TSH, T4TOTAL, FREET4, T3FREE, THYROIDAB in the last 72 hours. Anemia Panel: No results for input(s): VITAMINB12, FOLATE, FERRITIN, TIBC, IRON, RETICCTPCT in the last 72 hours. Urine analysis:    Component Value Date/Time   COLORURINE YELLOW 05/05/2016 2227   APPEARANCEUR HAZY (A) 05/05/2016 2227   LABSPEC 1.028 05/05/2016 2227   PHURINE 5.0 05/05/2016 2227   GLUCOSEU >=500 (A) 05/05/2016 2227   HGBUR MODERATE (A) 05/05/2016 2227   BILIRUBINUR NEGATIVE 05/05/2016 2227   BILIRUBINUR small 02/10/2014 1127   KETONESUR 5 (A) 05/05/2016 2227   PROTEINUR 100 (A) 05/05/2016 2227   UROBILINOGEN 1.0 02/10/2014 2233   NITRITE NEGATIVE 05/05/2016 2227   LEUKOCYTESUR MODERATE (A) 05/05/2016 2227    Radiological Exams on Admission: DG Chest 2 View  Result Date: 09/02/2019 CLINICAL DATA:  Weakness, suspected sepsis EXAM: CHEST - 2 VIEW COMPARISON:  04/13/2010 FINDINGS: Frontal and lateral views of the chest demonstrate right internal jugular dialysis catheter tip in the region of the superior vena cava. Cardiac silhouette is enlarged. There is mild diffuse interstitial prominence without airspace disease, effusion, or pneumothorax. No acute bony abnormalities. IMPRESSION: 1. Mild diffuse interstitial prominence, likely chronic. No superimposed airspace disease. Electronically Signed   By: Randa Ngo M.D.   On: 09/02/2019 17:04    EKG: Independently reviewed.  Normal sinus rhythm, nonspecific ST-T wave changes, prolonged QT interval.  Assessment/Plan Principal Problem:   Symptomatic anemia Active  Problems:   ESRD on dialysis ([place])   Benign essential HTN   Thrombocytopenia (HCC)   Hypokalemia   Fever of unknown origin   Symptomatic anemia: -Likely secondary to acute on chronic anemia.  Patient's H&H 5.8/19.1 was  7.7/25.2-->4 days ago.  No active bleeding noted from dialysis catheter. -Admit patient at progressive unit for close monitoring.  Transfuse 2 unit of PRBC.  Monitor H&H closely.  Monitor vitals closely.  Monitor signs of fluid overload. -Bone marrow biopsy: 01/20/2019 which showed: Hypercellular bone marrow 50 to 60% for age with trilineage hematopoiesis, several small lymphoid aggregates present. Mild dyspoietic changes involving the  red cells. No increase in blasts. Confirmed that these changes are related to chronic kidney disease. Flow cytometry: Negative, platelet clumping noted suggestive of pseudothrombocytopenia.  Fever of unknown etiology: Fever of 101.8.  Lactic acid: WNL, no leukocytosis.  UA, COVID-19, blood culture: Pending.  Chest x-ray: No acute findings. -Could be secondary to catheter related? -Discussed with pharmacy & Dr. Dickie La.  We will start on broad-spectrum antibiotics IV Vanco and cefepime. -Consider de-escalation of antibiotics based on blood culture result -May need transthoracic echo to r/o IE?  ESRD on dialysis -No signs of fluid overload. -Consulted nephrology  Hypokalemia: Replenished -Check magnesium level. -Repeat BMP tomorrow a.m.  Hypertension: Blood pressure is on lower side -Hold amlodipine, Coreg, losartan and nifedipine -Resume home BP meds once blood pressure is back to baseline  Thrombocytopenia: -Last platelet count 104.  Repeat CBC  Diet-controlled type 2 diabetes mellitus: Last A1c 5.5%  DVT prophylaxis: SCD Code Status: Full code-confirmed with patient's husband Family Communication: Patient's husband present at bedside.  Plan of care discussed with patient in length and he verbalized understanding and agreed with  it. Disposition Plan: Likely home in 2 to 3 days Consults called: Nephrology Admission status: Inpatient   Mckinley Jewel MD [facility]  If 7PM-7AM, please contact night-coverage [email or web address] Password [email or web address]  09/02/2019, 5:49 PM

## 2019-09-02 NOTE — Progress Notes (Signed)
Pharmacy Antibiotic Note  Bailey [name] is a 69 y.o. female admitted on 09/02/2019 with fever of unknown origin.  Pharmacy has been consulted for vancomycin and cefepime dosing.  Patient with fever of 101.8, wbc normal at 7.9. Appears to have had recent AKI that is continuing to improve. Scr up to 7 5/21 and currently down to 1.75 so will adjust antibiotic doses as appropriate.   Plan: Cefepime 2g q24 hours Vancomycin $RemoveBeforeDE'1250mg'[id number]$  IV x1 now then $Remove'500mg'HjWtspG$  IV q24 hours Follow renal function closely  Height: $Remove'5\' 3"'CPjwyuv$  (160 cm) Weight: 59 kg (130 lb 1.1 oz) IBW/kg (Calculated) : 52.4  Temp (24hrs), Avg:99.8 F (37.7 C), Min:99 F (37.2 C), Max:101.8 F (38.8 C)  Recent Labs  Lab 08/29/19 1714 09/02/19 1614  WBC 5.8 7.9  CREATININE 1.88* 1.75*  LATICACIDVEN  --  1.7    Estimated Creatinine Clearance: 24.7 mL/min (A) (by C-G formula based on SCr of 1.75 mg/dL (H)).    Allergies  Allergen Reactions  . No Known Allergies     Thank you for allowing pharmacy to be a part of this patient's care.  Erin Hearing PharmD., BCPS Clinical Pharmacist 09/02/2019 7:14 PM

## 2019-09-02 NOTE — ED Notes (Signed)
MD requesting IV antibiotics to be given in between blood transfusions

## 2019-09-02 NOTE — ED Triage Notes (Signed)
The pt just came from dialysis  She is c/o weakness and being cold all day  Fever here

## 2019-09-03 ENCOUNTER — Inpatient Hospital Stay (HOSPITAL_COMMUNITY): Payer: Medicare HMO

## 2019-09-03 DIAGNOSIS — N186 End stage renal disease: Secondary | ICD-10-CM

## 2019-09-03 DIAGNOSIS — R7881 Bacteremia: Secondary | ICD-10-CM

## 2019-09-03 DIAGNOSIS — D631 Anemia in chronic kidney disease: Secondary | ICD-10-CM

## 2019-09-03 DIAGNOSIS — I120 Hypertensive chronic kidney disease with stage 5 chronic kidney disease or end stage renal disease: Secondary | ICD-10-CM

## 2019-09-03 DIAGNOSIS — E8771 Transfusion associated circulatory overload: Secondary | ICD-10-CM

## 2019-09-03 DIAGNOSIS — Z992 Dependence on renal dialysis: Secondary | ICD-10-CM

## 2019-09-03 DIAGNOSIS — J9601 Acute respiratory failure with hypoxia: Secondary | ICD-10-CM

## 2019-09-03 LAB — BLOOD CULTURE ID PANEL (REFLEXED)

## 2019-09-03 LAB — CBC
HCT: 26.8 % — ABNORMAL LOW (ref 36.0–46.0)
Hemoglobin: 8.5 g/dL — ABNORMAL LOW (ref 12.0–15.0)
MCH: 30 pg (ref 26.0–34.0)
MCHC: 31.7 g/dL (ref 30.0–36.0)
MCV: 94.7 fL (ref 80.0–100.0)
Platelets: 80 10*3/uL — ABNORMAL LOW (ref 150–400)
RBC: 2.83 MIL/uL — ABNORMAL LOW (ref 3.87–5.11)
RDW: 14.3 % (ref 11.5–15.5)
WBC: 7.2 10*3/uL (ref 4.0–10.5)
nRBC: 0 % (ref 0.0–0.2)

## 2019-09-03 LAB — URINALYSIS, ROUTINE W REFLEX MICROSCOPIC
Bilirubin Urine: NEGATIVE
Glucose, UA: 150 mg/dL — AB
Ketones, ur: NEGATIVE mg/dL
Leukocytes,Ua: NEGATIVE
Nitrite: NEGATIVE
Protein, ur: >=300 mg/dL — AB
Specific Gravity, Urine: 1.014 (ref 1.005–1.030)
pH: 9 — ABNORMAL HIGH (ref 5.0–8.0)

## 2019-09-03 LAB — MRSA PCR SCREENING: MRSA by PCR: NEGATIVE

## 2019-09-03 LAB — COMPREHENSIVE METABOLIC PANEL
ALT: 21 U/L (ref 0–44)
AST: 24 U/L (ref 15–41)
Albumin: 2.5 g/dL — ABNORMAL LOW (ref 3.5–5.0)
Alkaline Phosphatase: 73 U/L (ref 38–126)
Anion gap: 13 (ref 5–15)
BUN: 15 mg/dL (ref 8–23)
CO2: 28 mmol/L (ref 22–32)
Calcium: 8.2 mg/dL — ABNORMAL LOW (ref 8.9–10.3)
Chloride: 97 mmol/L — ABNORMAL LOW (ref 98–111)
Creatinine, Ser: 2.66 mg/dL — ABNORMAL HIGH (ref 0.44–1.00)
GFR calc Af Amer: 20 mL/min — ABNORMAL LOW (ref >60)
GFR calc non Af Amer: 17 mL/min — ABNORMAL LOW (ref >60)
Glucose, Bld: 192 mg/dL — ABNORMAL HIGH (ref 70–99)
Potassium: 3.7 mmol/L (ref 3.5–5.1)
Sodium: 138 mmol/L (ref 135–145)
Total Bilirubin: 0.9 mg/dL (ref 0.3–1.2)
Total Protein: 5.6 g/dL — ABNORMAL LOW (ref 6.5–8.1)

## 2019-09-03 LAB — GLUCOSE, CAPILLARY
Glucose-Capillary: 131 mg/dL — ABNORMAL HIGH (ref 70–99)
Glucose-Capillary: 150 mg/dL — ABNORMAL HIGH (ref 70–99)
Glucose-Capillary: 158 mg/dL — ABNORMAL HIGH (ref 70–99)
Glucose-Capillary: 188 mg/dL — ABNORMAL HIGH (ref 70–99)

## 2019-09-03 LAB — PROTIME-INR
INR: 1.5 — ABNORMAL HIGH (ref 0.8–1.2)
Prothrombin Time: 17.4 seconds — ABNORMAL HIGH (ref 11.4–15.2)

## 2019-09-03 LAB — PROCALCITONIN: Procalcitonin: 25.69 ng/mL

## 2019-09-03 LAB — APTT: aPTT: 34 seconds (ref 24–36)

## 2019-09-03 MED ORDER — CYANOCOBALAMIN 1000 MCG/ML IJ SOLN
1000.0000 ug | INTRAMUSCULAR | Status: DC
  Administered 2019-09-04: 1000 ug via INTRAMUSCULAR
  Filled 2019-09-03 (×2): qty 1

## 2019-09-03 MED ORDER — DARBEPOETIN ALFA 100 MCG/0.5ML IJ SOSY
100.0000 ug | PREFILLED_SYRINGE | INTRAMUSCULAR | Status: DC
  Filled 2019-09-03: qty 0.5

## 2019-09-03 MED ORDER — FUROSEMIDE 10 MG/ML IJ SOLN
80.0000 mg | Freq: Once | INTRAMUSCULAR | Status: AC
  Administered 2019-09-03: 80 mg via INTRAVENOUS
  Filled 2019-09-03: qty 8

## 2019-09-03 MED ORDER — CHLORHEXIDINE GLUCONATE CLOTH 2 % EX PADS
6.0000 | MEDICATED_PAD | Freq: Every day | CUTANEOUS | Status: DC
  Administered 2019-09-04 – 2019-09-08 (×2): 6 via TOPICAL

## 2019-09-03 MED ORDER — CHLORHEXIDINE GLUCONATE CLOTH 2 % EX PADS
6.0000 | MEDICATED_PAD | Freq: Every day | CUTANEOUS | Status: DC
  Administered 2019-09-03 – 2019-09-08 (×3): 6 via TOPICAL

## 2019-09-03 MED ORDER — ESCITALOPRAM OXALATE 10 MG PO TABS: 5.0000 mg | ORAL_TABLET | Freq: Every day | ORAL | Status: DC | PRN

## 2019-09-03 MED ORDER — DOXERCALCIFEROL 4 MCG/2ML IV SOLN
2.0000 ug | INTRAVENOUS | Status: DC
  Administered 2019-09-05 – 2019-09-07 (×2): 2 ug via INTRAVENOUS
  Filled 2019-09-03 (×2): qty 2

## 2019-09-03 MED ORDER — SODIUM CHLORIDE 0.9 % IV SOLN
1.0000 g | INTRAVENOUS | Status: DC
  Filled 2019-09-03: qty 1

## 2019-09-03 MED ORDER — CEFAZOLIN SODIUM-DEXTROSE 1-4 GM/50ML-% IV SOLN
1.0000 g | INTRAVENOUS | Status: DC
  Administered 2019-09-03 – 2019-09-06 (×4): 1 g via INTRAVENOUS
  Administered 2019-09-07: 2 g via INTRAVENOUS
  Administered 2019-09-07: 1 g via INTRAVENOUS
  Filled 2019-09-03 (×7): qty 50

## 2019-09-03 NOTE — Progress Notes (Signed)
Pharmacy Antibiotic Note  Bailey [name] is a 69 y.o. female admitted on 09/02/2019 with MSSA bacteremia.  Pharmacy has been consulted for Ancef dosing.  HD today then plan to remove line for line holiday.   Plan: Discontinue Cefepime. Start Cefazolin 1g IV every 24 hours.  Will follow up HD schedule.   Height: $Remove'5\' 3"'CGxYzjG$  (160 cm) Weight: 59 kg (130 lb 1.1 oz) IBW/kg (Calculated) : 52.4  Temp (24hrs), Avg:99.2 F (37.3 C), Min:97.8 F (36.6 C), Max:101.8 F (38.8 C)  Recent Labs  Lab 08/29/19 1714 09/02/19 1614 09/03/19 0340  WBC 5.8 7.9 7.2  CREATININE 1.88* 1.75* 2.66*  LATICACIDVEN  --  1.7  --     Estimated Creatinine Clearance: 16.3 mL/min (A) (by C-G formula based on SCr of 2.66 mg/dL (H)).    No Known Allergies  Antimicrobials this admission: Vancomycin 7/10 >> 7/11 Cefepime 7/10 >> 7/11 Cefazolin 7/11 >>   Dose adjustments this admission:  Microbiology results: 7/10 BCx >> MSSA 7/10 UCx >> 7/10 MRSA PCR negative 7/10 COVID negative  Thank you for allowing pharmacy to be a part of this patient's care.  Sloan Leiter, PharmD, BCPS, BCCCP Clinical Pharmacist Please refer to [facility] for [place] numbers 09/03/2019 12:36 PM

## 2019-09-03 NOTE — Progress Notes (Signed)
Called for increasing O2-requirement during blood transfusion.   Patient presented with gen weakness after completing HD, was found to have Hgb 5.8 (7.7 four days earlier) with no active bleeding noted, and was admitted for symptomatic anemia.   She completed 1 unit of RBC and was on her second unit when she became tachypneic and hypoxic. She was on rm air in ED initially, later placed on 1-2 Lpm, and now requiring 5 Lpm.    She had a fever prior to the transfusions. BP is elevated and no rash or angioedema noted.   Patient appears well, reports feeling "blah," but no specific complaints.   TACO suspected. Plan to hold transfusion, get CXR, and diurese with IV Lasix.

## 2019-09-03 NOTE — Consult Note (Addendum)
[name] Renal Consultation Note    Indication for Consultation:  Management of ESRD/hemodialysis; anemia, hypertension/volume and secondary hyperparathyroidism  DEY:CXKGY, Mechele Claude, MD  HPI: Bailey [name] is a 69 y.o. female. ESRD 2/2 diabetes, new start on HD TTS at [facility] since 08/21/19.  Past medical history significant for HTN x3-75yrs, DMT2 w/ history of retinopathy, polyneuropathy, diabetic foot infection status post amputation of bilateral hallux; PVD, HLD and anxiety.  Last dialysis 7/10, completed 2hrs 18min and left 1kg over her EDW.  Left BC AVF placed by Dr. Carlis Abbott on 06/08/19.  Per [facility] last note it should be ready for use on 09/07/19.   Patient seen and examined in room with no family present.  Patient is poor historian, majority of history obtain from chart review.  Presented to the ED after dialysis due to weakness and being cold.  She became hypoxic and SOB following administration of 2units pRBC overnight.  Biggest complaint today is feeling cold.  Denies SOB, CP, n/v/d, abdominal pain and bleeding.    Last admit 6/26 - 6/27 due to anemia 2/2 TDC bleeding s/p 2unit pRBC.  Sent to the ED again on 7/6 due to bleeding around dialysis catheter during treatment, and bleeding resolved follow direct pressure, pressure dressing and thrombin pad.  She underwent a catheter exchange on 7/8 and has had no bleeding at her last 2 treatments.  On ESA and weekly iron at outpatient dialysis.  Followed by hematologist Dr. Quintella Reichert.   Pertinent findings include Hgb 5.8, temp 101.8, tachypnea, hypoxia, CXR with increased vascular congestion/pulmonary edema, and blood cultures + MSSA.  Patient has been admitted for further evaluation and management.    Past Medical History:  Diagnosis Date  . Anemia    patient preference - stopped iron   . ARF (acute renal failure) ([place]) 04/2016   dehydration  . Chronic kidney disease    stage 5  . Depression   . Diabetes mellitus    Type  II - pt preference - stopped lantus  . Diabetic retinopathy ([place])   . Diverticulitis   . History of kidney stones    passed- 6  . Hypertension   . Osteomyelitis ([place])   . Vitamin B 12 deficiency 06/09/2017  . Vitamin D deficiency   . Wears partial dentures    upper   Past Surgical History:  Procedure Laterality Date  . AMPUTATION Right 08/14/2016   Procedure: RIGHT 1ST RAY AMPUTATION MID-SHAFT;  Surgeon: Newt Minion, MD;  Location: [place];  Service: Orthopedics;  Laterality: Right;  . BASCILIC VEIN TRANSPOSITION Left 06/08/2019   Procedure: LEFT BRACHIOCEPHALIC VEIN CREATION;  Surgeon: Marty Heck, MD;  Location: [place];  Service: Vascular;  Laterality: Left;  . INCISION AND DRAINAGE ABSCESS Left 02/11/2014   Procedure: INCISION AND DRAINAGE ABSCESS Left Buttock;  Surgeon: Jackolyn Confer, MD;  Location: [facility];  Service: General;  Laterality: Left;  . LAPAROSCOPIC SMALL BOWEL RESECTION N/A 05/09/2016   Procedure: LAPAROSCOPIC SMALL BOWEL RESECTION;  Surgeon: Michael Boston, MD;  Location: [facility];  Service: General;  Laterality: N/A;  . LAPAROSCOPY N/A 05/09/2016   Procedure: LAPAROSCOPY DIAGNOSTIC, LYSIS OF ADHESIONS, SMALL BOWEL RESECTION X 2;  Surgeon: Michael Boston, MD;  Location: [facility];  Service: General;  Laterality: N/A;  . TOE AMPUTATION     left foot great toe   Family History  Problem Relation Age of Onset  . Diabetes Mother   . Asthma Sister    Social History:  reports that she has never smoked. She has never used smokeless tobacco. She reports that she does not drink alcohol and does not use drugs. No Known Allergies Prior to Admission medications   Medication Sig Start Date End Date Taking? Authorizing Provider  acetaminophen (TYLENOL) 500 MG tablet Take 1,000 mg by mouth every 6 (six) hours as needed for fever or headache (pain).    Yes [provider]  carvedilol (COREG) 25 MG tablet Take 25 mg by mouth 2 (two) times daily with a meal.    Yes [provider]  Cholecalciferol (VITAMIN D-3) 125 MCG (5000 UT) TABS Take 5,000 Units by mouth 2 (two) times daily.   Yes [provider]  cyanocobalamin (,VITAMIN B-12,) 1000 MCG/ML injection Inject 1,000 mcg into the muscle every 30 (thirty) days.   Yes [provider]  escitalopram (LEXAPRO) 5 MG tablet Take 5 mg by mouth daily as needed (anxiety).   Yes [provider]  furosemide (LASIX) 20 MG tablet Take 20 mg by mouth daily.   Yes [provider]  NIFEdipine (PROCARDIA-XL/NIFEDICAL-XL) 30 MG 24 hr tablet Take 30 mg by mouth at bedtime.    Yes [provider]  sodium bicarbonate 650 MG tablet Take 650 mg by mouth 2 (two) times daily.   Yes [provider]  Vitamin D, Ergocalciferol, (DRISDOL) 1.25 MG (50000 UNIT) CAPS capsule Take 50,000 Units by mouth every Sunday.    Yes [provider]   Current Facility-Administered Medications  Medication Dose Route Frequency Provider Last Rate Last Admin  . acetaminophen (TYLENOL) tablet 650 mg  650 mg Oral Q6H PRN Pahwani, Rinka R, MD   650 mg at 09/03/19 0043   Or  . acetaminophen (TYLENOL) suppository 650 mg  650 mg Rectal Q6H PRN Pahwani, Rinka R, MD      . ceFEPIme (MAXIPIME) 1 g in sodium chloride 0.9 % 100 mL IVPB  1 g Intravenous Q24H Priscella Mann, RPH      . Chlorhexidine Gluconate Cloth 2 % PADS 6 each  6 each Topical Daily Pahwani, Rinka R, MD   6 each at 09/03/19 0836  . ondansetron (ZOFRAN) tablet 4 mg  4 mg Oral Q6H PRN Pahwani, Rinka R, MD       Or  . ondansetron (ZOFRAN) injection 4 mg  4 mg Intravenous Q6H PRN Pahwani, Rinka R, MD      . pneumococcal 23 valent vaccine (PNEUMOVAX-23) injection 0.5 mL  0.5 mL Intramuscular Prior to discharge Mckinley Jewel, MD       Labs: Basic Metabolic Panel: Recent Labs  Lab 08/29/19 1714 09/02/19 1614 09/02/19 1811 09/03/19 0340  NA 142 138  --  138  K 3.0* 3.0*  --  3.7  CL 98 98  --  97*  CO2 34* 31  --  28  GLUCOSE  176* 134*  --  192*  BUN 5* 6*  --  15  CREATININE 1.88* 1.75*  --  2.66*  CALCIUM 8.0* 7.9*  --  8.2*  PHOS  --   --  2.5  --    Liver Function Tests: Recent Labs  Lab 09/02/19 1614 09/03/19 0340  AST 22 24  ALT 17 21  ALKPHOS 72 73  BILITOT 0.5 0.9  PROT 5.6* 5.6*  ALBUMIN 2.5* 2.5*   CBC: Recent Labs  Lab 08/29/19 1714 09/02/19 1614 09/03/19 0340  WBC 5.8 7.9 7.2  NEUTROABS  --  7.0  --   HGB 7.7*  5.8* 8.5*  HCT 25.2* 19.1* 26.8*  MCV 96.9 95.5 94.7  PLT 104* PLATELET CLUMPS NOTED ON SMEAR, COUNT APPEARS DECREASED 80*   Studies/Results: DG Chest 2 View  Result Date: 09/02/2019 CLINICAL DATA:  Weakness, suspected sepsis EXAM: CHEST - 2 VIEW COMPARISON:  04/13/2010 FINDINGS: Frontal and lateral views of the chest demonstrate right internal jugular dialysis catheter tip in the region of the superior vena cava. Cardiac silhouette is enlarged. There is mild diffuse interstitial prominence without airspace disease, effusion, or pneumothorax. No acute bony abnormalities. IMPRESSION: 1. Mild diffuse interstitial prominence, likely chronic. No superimposed airspace disease. Electronically Signed   By: Randa Ngo M.D.   On: 09/02/2019 17:04   DG CHEST PORT 1 VIEW  Result Date: 09/03/2019 CLINICAL DATA:  Acute respiratory failure with hypoxia. EXAM: PORTABLE CHEST 1 VIEW COMPARISON:  Chest x-ray dated 09/02/2019. FINDINGS: Stable cardiomegaly. RIGHT chest wall catheter is stable in position with tip at the level of the lower SVC. Increased central pulmonary vascular congestion and bilateral interstitial prominence. No confluent opacity to suggest consolidating pneumonia. No pleural effusion or pneumothorax is seen. IMPRESSION: Increased central pulmonary vascular congestion and bilateral interstitial prominence, suggesting worsening fluid status, CHF/volume overload. Electronically Signed   By: Franki Cabot M.D.   On: 09/03/2019 05:47    ROS: All others negative except those  listed in HPI.  Physical Exam: Vitals:   09/03/19 0500 09/03/19 0525 09/03/19 0555 09/03/19 0924  BP:  (!) 142/59 (!) 155/64   Pulse:      Resp: (!) 30 (!) 34 (!) 35 16  Temp:      TempSrc:      SpO2: 96% 99% 97% 97%  Weight:      Height:         General: WDWN, pleasant female, laying in bed Head: NCAT sclera not icteric  Neck: Supple. No JVD appreciated. Lungs: nml WOB on 5L O2, +crackles  Heart: RRR. No murmur, rubs or gallops.  Abdomen: soft, nontender, +BS, no guarding, no rebound tenderness  Lower extremities:no edema, ischemic changes, or open wounds  Neuro: Alert. Moves all extremities spontaneously. Psych:  Responds to questions appropriately with a normal affect. Dialysis Access: TDC, LU AVF +b/t  Dialysis Orders:  TTS - [facility]  3.5hrs, BFR 400, DFR AF 1.5,  EDW 56kg, 2K/ 2.25Ca  Access: TDC, LU AVF maturing  Heparin none Mircera 75 mcg q2wks - last 08/19/19 Venofer $RemoveBeforeD'50mg'[id number]$  qwk Hectorol 2 mcg IV qHD   Assessment/Plan: 1.  MSSA bacteremia - tmax 101.8 last 24hrs.  BC +MSSA.  On Vanc/Cefepime.  Etiology unclear, could be catheter related. Plan to remove catheter after dialysis for line holiday.   2. Volume overload - CXR with vascular congestion/pulm edema.  Plan for extra HD today, net UF 3L as tolerated.   3.  ESRD -  New start on HD TTS.  Plan for HD later today for volume removal.  Plan to have [facility] removed likely tomorrow after dialysis for line holiday d/t #1.  Will consult IR for [facility].  AVF ready for use on 7/15, may be able to start using a few days early, if able to cannulate successfully will not need to have new catheter placed.   4.  Hypertension  - Blood pressure elevated.  Continue home meds.   5.  Anemia of CKD - Hgb initially 5.8 in ED, improved to 8.5 s/p 2units pRBC.  Due for ESA, will dose with HD today.   6.  Secondary  Hyperparathyroidism -  Ca and phos in goal. Continue VDRA. Not on binders.  7.  Nutrition - Renal diet w/fluid restrictions. Alb  2.5, protein supplements.  8. DMT2 - per primary  Jen Mow, PA-C [facility] Pager: [phone] 09/03/2019, 10:02 AM

## 2019-09-03 NOTE — Progress Notes (Signed)
Dr. Myna Hidalgo notified of patients increasing O2 requirements during 2nd unit of PRBC's. Patient had to be increasd to 5L [place] d/t o2 sat of 82%. RR-40's and BP 170's-180's.  Blood transfusion stopped and patient given $RemoveBefor'80mg'[id number]$  of Lasix and CXR ordered. Blood was started at [place] and stopped at [place].  Patient had a fever prior to getting 1st unit of PRBC's. Continuing to monitor patient.

## 2019-09-03 NOTE — Progress Notes (Signed)
   09/03/19 0210  Assess: MEWS Score  Temp (!) 101.4 F (38.6 C)  O2 Device Nasal Cannula  O2 Flow Rate (L/min) 5 L/min  Assess: MEWS Score  MEWS Temp 1  MEWS Systolic 0  MEWS Pulse 0  MEWS RR 1  MEWS LOC 0  MEWS Score 2  MEWS Score Color Yellow  Assess: if the MEWS score is Yellow or Red  Were vital signs taken at a resting state? Yes  Focused Assessment Documented focused assessment  Early Detection of Sepsis Score *See Row Information* Medium  MEWS guidelines implemented *See Row Information* Yes  Treat  MEWS Interventions Escalated (See documentation below)  Take Vital Signs  Increase Vital Sign Frequency  Yellow: Q 2hr X 2 then Q 4hr X 2, if remains yellow, continue Q 4hrs  Escalate  MEWS: Escalate Yellow: discuss with charge nurse/RN and consider discussing with provider and RRT  Notify: Charge Nurse/RN  Name of Charge Nurse/RN Notified Yetta Glassman, RN  Date Charge Nurse/RN Notified 09/03/19  Time Charge Nurse/RN Notified 0210  Notify: Provider  Provider Name/Title Dr. Myna Hidalgo  Date Provider Notified 09/03/19  Time Provider Notified 0215  Notification Type Page  Notification Reason Change in status  Response See new orders  Date of Provider Response 09/03/19  Time of Provider Response 0215  Notify: Rapid Response  Name of Rapid Response RN Notified Mindy  Date Rapid Response Notified 09/03/19  Time Rapid Response Notified 0210  Document  Patient Outcome Stabilized after interventions  Progress note created (see row info) Yes

## 2019-09-03 NOTE — Progress Notes (Signed)
PROGRESS NOTE  Bailey [name] YDX:[id number] DOB: May 31, 1950 DOA: 09/02/2019 PCP: [name], MD  HPI/Recap of past 24 hours: HPI: Bailey [name] is a 69 y.o. female with medical history significant of ESRD recently started on dialysis (TTS), diet-controlled type 2 diabetes mellitus, anemia of chronic disease, history of osteomyelitis, hypertension came from dialysis with complaint of generalized weakness for a few days.    Patient's husband at bedside is the historian.  She has been feeling weak, lethargic, pale, cold however her symptoms started getting worse the morning of her presentation.Bailey [name]  She was recently admitted on 6/27 with bleeding from dialysis site and received 2 unit PRBC during that admission and discharged home in stable condition on same day.  She presented to ED on 7/6 with recurrent bleeding from dialysis catheter after dialysis which was controlled with direct pressure and pressure dressing along with thrombin pad and discharged home in stable condition.  Patient denies bleeding from dialysis site or bleeding per rectum.  No history of over-the-counter NSAID use.  She is not on anticoagulation.  Reports fever with chills and chronic dry cough however denies sputum production, chest congestion, wheezing, urinary symptoms such as dysuria, hematuria, change in urinary frequency, abdominal pain, diarrhea, joint pain or rashes.  She received COVID-19 vaccine couple of months ago.  She receives iron and B12 shots once in a month at hematologist office.  She underwent bone marrow biopsy on 01/20/2019-findings were related to chronic kidney disease.  Flow cytometry was negative.  Platelet clumping noted suggestive of pseudothrombocytopenia.  ED Course: Upon arrival to ED patient had fever of 101.8, no leukocytosis, hemoglobin 5.8.  EDP ordered 2 unit PRBCs.  [facility] hospital[facility] consulted for admission for symptomatic anemia and fever of unknown etiology.   Blood cultures 2/2  bottles positive for MSSA.  She received IV vancomycin and cefepime on admission.  2D echo ordered to rule out endocarditis.  Repeat blood cultures peripherally x2 planned for 09/04/2018 AM.  Infectious disease has been consulted per protocol.  09/03/19: Seen and examined at bedside she is on 3 L oxygen nasal cannula with O2 saturation in the 90s.  In acute respiratory distress overnight after starting blood transfusion with suspicion for TACO.  Received 1 dose of IV Lasix 80 mg x 1  Assessment/Plan: Principal Problem:   Symptomatic anemia Active Problems:   ESRD on dialysis (HCC)   Benign essential HTN   Thrombocytopenia (HCC)   Hypokalemia   Fever of unknown origin  Generalized weakness, likely multifactorial secondary to MSSA bacteremia, symptomatic anemia  Presented with hemoglobin of 5.8, 2 unit PRBCs ordered to be transfused, after 1 unit PRBC transfusion she developed suspected TACO and transfusion was stopped.  She received 1 dose of IV Lasix 80 mg x 1.  Was placed on oxygen supplementation, currently on 3 L nasal cannula.  Blood cultures x2+ for MSSA Infectious disease has been consulted for antibiotics recommendations Repeat blood cultures x2 peripherally on 09/04/2019 Continue fall precautions Continue to monitor H&H  Sepsis secondary to MSSA bacteremia Presented with a fever with T-max of 101.8, tachypnea with respiration rate of 29 in the setting of MSSA bacteremia 2 out of 2 bottles. MRSA screen negative on 09/03/2019. Unknown source at this point Blood cultures drawn on 09/02/2019 + for MSSA at 2 out of 2 bottles. 2D echo ordered to rule out infective endocarditis Repeat blood cultures peripherally x2 on 09/04/2019 Pharmacy consulted for Ancef Infectious disease has been consulted per protocol She has  received a dose of IV vancomycin and cefepime on admission. Monitor fever curve and WBC  Suspected transfusion associated circulatory overload, TACO Post blood transfusion,  this was stopped Received 1 dose of IV Lasix 80 mg x 1 Currently on 3 L nasal cannula Continue to maintain O2 saturation greater than 92% Continue to closely monitor respiratory status  Pulmonary edema in the setting of suspected TACO  independently viewed chest x-ray done early this morning which showed increasing pulmonary vascularity suggestive of pulmonary edema Received diuretics Volume status addressed with hemodialysis Continue to closely monitor and maintain O2 saturation greater than 92%  Acute hypoxic respiratory failure secondary to pulmonary edema Not on oxygen supplementation at baseline Currently requiring 3 L oxygen nasal cannula Management as stated above  ESRD, recently diagnosed, on dialysis TTS -Euvolemic on exam -Appreciate nephrology consulting services.  Resolved post repletion: Hypokalemia -Presented with serum sodium 3.7  -Serum magnesium 1.7  -Electrolyte repletion per nephrology  Essential hypertension Currently her blood pressure is soft Continue to hold off Coreg and nifedipine Continue to monitor vital signs  Chronic anxiety/depression Resume home escitalopram  Thrombocytopenia, chronic Platelet count 80K from 104K Continue to monitor  Diet-controlled type 2 diabetes mellitus:  Last A1c 5.5% Avoid hypoglycemia in the setting of ESRD on HD  DVT prophylaxis: SCD Code Status: Full code-confirmed with patient's husband Family Communication: Plan of care discussed with patient who verbalized understanding and agreed with it.   Consults called: Nephrology, infectious disease   Status is: Inpatient   Dispo: The patient is from: Home.              Anticipated d/c is to: Home.              Anticipated d/c date is: 09/06/2019               Patient currently not stable for discharge due to ongoing work-up and treatment for MSSA bacteremia         Objective: Vitals:   09/03/19 0555 09/03/19 0740 09/03/19 0924 09/03/19 1156  BP:  (!) 155/64 (!) 154/66  (!) 125/57  Pulse:  85  73  Resp: (!) 35 20 16 (!) 29  Temp:  98 F (36.7 C)  99.3 F (37.4 C)  TempSrc:  Oral  Oral  SpO2: 97% 94% 97% 95%  Weight:      Height:        Intake/Output Summary (Last 24 hours) at 09/03/2019 1230 Last data filed at 09/03/2019 1047 Gross per 24 hour  Intake 1459.59 ml  Output 300 ml  Net 1159.59 ml   Filed Weights   09/02/19 1602 09/02/19 2049  Weight: 59 kg 59 kg    Exam:  . General: 69 y.o. year-old female well developed well nourished in no acute distress.  Alert and oriented x3. . Cardiovascular: Regular rate and rhythm with no rubs or gallops.  No thyromegaly or JVD noted.   Bailey [name] Respiratory: Diffuse rales bilaterally no wheezing noted.  Poor inspiratory effort. . Abdomen: Soft nontender nondistended with normal bowel sounds x4 quadrants. . Musculoskeletal: Trace lower extremity edema bilaterally . Psychiatry: Mood is appropriate for condition and setting   Data Reviewed: CBC: Recent Labs  Lab 08/29/19 1714 09/02/19 1614 09/03/19 0340  WBC 5.8 7.9 7.2  NEUTROABS  --  7.0  --   HGB 7.7* 5.8* 8.5*  HCT 25.2* 19.1* 26.8*  MCV 96.9 95.5 94.7  PLT 104* PLATELET CLUMPS NOTED ON SMEAR, COUNT APPEARS DECREASED 80*  Basic Metabolic Panel: Recent Labs  Lab 08/29/19 1714 09/02/19 1614 09/02/19 1811 09/03/19 0340  NA 142 138  --  138  K 3.0* 3.0*  --  3.7  CL 98 98  --  97*  CO2 34* 31  --  28  GLUCOSE 176* 134*  --  192*  BUN 5* 6*  --  15  CREATININE 1.88* 1.75*  --  2.66*  CALCIUM 8.0* 7.9*  --  8.2*  MG  --   --  1.7  --   PHOS  --   --  2.5  --    GFR: Estimated Creatinine Clearance: 16.3 mL/min (A) (by C-G formula based on SCr of 2.66 mg/dL (H)). Liver Function Tests: Recent Labs  Lab 09/02/19 1614 09/03/19 0340  AST 22 24  ALT 17 21  ALKPHOS 72 73  BILITOT 0.5 0.9  PROT 5.6* 5.6*  ALBUMIN 2.5* 2.5*   No results for input(s): LIPASE, AMYLASE in the last 168 hours. No results for input(s):  AMMONIA in the last 168 hours. Coagulation Profile: Recent Labs  Lab 08/29/19 1958 09/02/19 1614 09/03/19 0340  INR 1.2 1.3* 1.5*   Cardiac Enzymes: No results for input(s): CKTOTAL, CKMB, CKMBINDEX, TROPONINI in the last 168 hours. BNP (last 3 results) No results for input(s): PROBNP in the last 8760 hours. HbA1C: No results for input(s): HGBA1C in the last 72 hours. CBG: Recent Labs  Lab 09/02/19 2130 09/03/19 0820 09/03/19 1158  GLUCAP 141* 131* 150*   Lipid Profile: No results for input(s): CHOL, HDL, LDLCALC, TRIG, CHOLHDL, LDLDIRECT in the last 72 hours. Thyroid Function Tests: Recent Labs    09/02/19 1811  TSH 3.050   Anemia Panel: No results for input(s): VITAMINB12, FOLATE, FERRITIN, TIBC, IRON, RETICCTPCT in the last 72 hours. Urine analysis:    Component Value Date/Time   COLORURINE YELLOW 09/03/2019 0558   APPEARANCEUR CLEAR 09/03/2019 0558   LABSPEC 1.014 09/03/2019 0558   PHURINE 9.0 (H) 09/03/2019 0558   GLUCOSEU 150 (A) 09/03/2019 0558   HGBUR SMALL (A) 09/03/2019 0558   BILIRUBINUR NEGATIVE 09/03/2019 0558   BILIRUBINUR small 02/10/2014 1127   KETONESUR NEGATIVE 09/03/2019 0558   PROTEINUR >=300 (A) 09/03/2019 0558   UROBILINOGEN 1.0 02/10/2014 2233   NITRITE NEGATIVE 09/03/2019 0558   LEUKOCYTESUR NEGATIVE 09/03/2019 0558   Sepsis Labs: _0 (procalcitonin:4,lacticidven:4)  ) Recent Results (from the past 240 hour(s))  Culture, blood (Routine x 2)     Status: None (Preliminary result)   Collection Time: 09/02/19  4:10 PM   Specimen: BLOOD  Result Value Ref Range Status   Specimen Description BLOOD BLOOD LEFT FOREARM  Final   Special Requests   Final    BOTTLES DRAWN AEROBIC AND ANAEROBIC Blood Culture adequate volume   Culture  Setup Time   Final    GRAM POSITIVE COCCI IN CLUSTERS IN BOTH AEROBIC AND ANAEROBIC BOTTLES Performed at [facility], [place] [address], [place], [place] [address]    Culture PENDING  Incomplete    Report Status PENDING  Incomplete  Culture, blood (Routine x 2)     Status: None (Preliminary result)   Collection Time: 09/02/19  4:14 PM   Specimen: BLOOD  Result Value Ref Range Status   Specimen Description BLOOD SITE NOT SPECIFIED  Final   Special Requests   Final    BOTTLES DRAWN AEROBIC AND ANAEROBIC Blood Culture results may not be optimal due to an excessive volume of blood received in culture bottles  Culture  Setup Time   Final    GRAM POSITIVE COCCI IN CLUSTERS IN BOTH AEROBIC AND ANAEROBIC BOTTLES CRITICAL RESULT CALLED TO, READ BACK BY AND VERIFIED WITH: PHARMD K. PIERCE [phone] FCP Performed at [facility], [place] [address] [place], [place] [address]    Culture GRAM POSITIVE COCCI  Final   Report Status PENDING  Incomplete  Blood Culture ID Panel (Reflexed)     Status: Abnormal   Collection Time: 09/02/19  4:14 PM  Result Value Ref Range Status   Enterococcus species NOT DETECTED NOT DETECTED Final   Listeria monocytogenes NOT DETECTED NOT DETECTED Final   Staphylococcus species DETECTED (A) NOT DETECTED Final    Comment: CRITICAL RESULT CALLED TO, READ BACK BY AND VERIFIED WITH: PHARMD K. PIERCE [phone] FCP    Staphylococcus aureus (BCID) DETECTED (A) NOT DETECTED Final    Comment: Methicillin (oxacillin) susceptible Staphylococcus aureus (MSSA). Preferred therapy is anti staphylococcal beta lactam antibiotic (Cefazolin or Nafcillin), unless clinically contraindicated. CRITICAL RESULT CALLED TO, READ BACK BY AND VERIFIED WITH: PHARMD K. PIERCE [phone] FCP    Methicillin resistance NOT DETECTED NOT DETECTED Final   Streptococcus species NOT DETECTED NOT DETECTED Final   Streptococcus agalactiae NOT DETECTED NOT DETECTED Final   Streptococcus pneumoniae NOT DETECTED NOT DETECTED Final   Streptococcus pyogenes NOT DETECTED NOT DETECTED Final   Acinetobacter baumannii NOT DETECTED NOT DETECTED Final   Enterobacteriaceae species NOT DETECTED NOT  DETECTED Final   Enterobacter cloacae complex NOT DETECTED NOT DETECTED Final   Escherichia coli NOT DETECTED NOT DETECTED Final   Klebsiella oxytoca NOT DETECTED NOT DETECTED Final   Klebsiella pneumoniae NOT DETECTED NOT DETECTED Final   Proteus species NOT DETECTED NOT DETECTED Final   Serratia marcescens NOT DETECTED NOT DETECTED Final   Haemophilus influenzae NOT DETECTED NOT DETECTED Final   Neisseria meningitidis NOT DETECTED NOT DETECTED Final   Pseudomonas aeruginosa NOT DETECTED NOT DETECTED Final   Candida albicans NOT DETECTED NOT DETECTED Final   Candida glabrata NOT DETECTED NOT DETECTED Final   Candida krusei NOT DETECTED NOT DETECTED Final   Candida parapsilosis NOT DETECTED NOT DETECTED Final   Candida tropicalis NOT DETECTED NOT DETECTED Final    Comment: Performed at [facility], [place] [address] [place], [place] [address]  SARS Coronavirus 2 by RT PCR (hospital order, performed in [facility] hospital lab) Nasopharyngeal Nasopharyngeal Swab     Status: None   Collection Time: 09/02/19  5:19 PM   Specimen: Nasopharyngeal Swab  Result Value Ref Range Status   SARS Coronavirus 2 NEGATIVE NEGATIVE Final    Comment: (NOTE) SARS-CoV-2 target nucleic acids are NOT DETECTED.  The SARS-CoV-2 RNA is generally detectable in upper and lower respiratory specimens during the acute phase of infection. The lowest concentration of SARS-CoV-2 viral copies this assay can detect is 250 copies / mL. A negative result does not preclude SARS-CoV-2 infection and should not be used as the sole basis for treatment or other patient management decisions.  A negative result may occur with improper specimen collection / handling, submission of specimen other than nasopharyngeal swab, presence of viral mutation(s) within the areas targeted by this assay, and inadequate number of viral copies (<250 copies / mL). A negative result must be combined with clinical observations, patient  history, and epidemiological information.  Fact Sheet for Patients:   StrictlyIdeas.no  Fact Sheet for Healthcare Providers: BankingDealers.co.za  This test is not yet approved or  cleared by the [place] and has been authorized for detection and/or diagnosis of SARS-CoV-2 by FDA under an Emergency Use Authorization (EUA).  This EUA will remain in effect (meaning this test can be used) for the duration of the COVID-19 declaration under Section 564(b)(1) of the Act, 21 U.S.C. section 360bbb-3(b)(1), unless the authorization is terminated or revoked sooner.  Performed at [facility], [place] [address], [place], [place] [address]   MRSA PCR Screening     Status: None   Collection Time: 09/03/19  5:58 AM   Specimen: Nasopharyngeal  Result Value Ref Range Status   MRSA by PCR NEGATIVE NEGATIVE Final    Comment:        The GeneXpert MRSA Assay (FDA approved for NASAL specimens only), is one component of a comprehensive MRSA colonization surveillance program. It is not intended to diagnose MRSA infection nor to guide or monitor treatment for MRSA infections. Performed at [facility], [place] [address] [place], [place] [address]       Studies: DG Chest 2 View  Result Date: 09/02/2019 CLINICAL DATA:  Weakness, suspected sepsis EXAM: CHEST - 2 VIEW COMPARISON:  04/13/2010 FINDINGS: Frontal and lateral views of the chest demonstrate right internal jugular dialysis catheter tip in the region of the superior vena cava. Cardiac silhouette is enlarged. There is mild diffuse interstitial prominence without airspace disease, effusion, or pneumothorax. No acute bony abnormalities. IMPRESSION: 1. Mild diffuse interstitial prominence, likely chronic. No superimposed airspace disease. Electronically Signed   By: Randa Ngo M.D.   On: 09/02/2019 17:04   DG CHEST PORT 1 VIEW  Result Date: 09/03/2019 CLINICAL DATA:  Acute  respiratory failure with hypoxia. EXAM: PORTABLE CHEST 1 VIEW COMPARISON:  Chest x-ray dated 09/02/2019. FINDINGS: Stable cardiomegaly. RIGHT chest wall catheter is stable in position with tip at the level of the lower SVC. Increased central pulmonary vascular congestion and bilateral interstitial prominence. No confluent opacity to suggest consolidating pneumonia. No pleural effusion or pneumothorax is seen. IMPRESSION: Increased central pulmonary vascular congestion and bilateral interstitial prominence, suggesting worsening fluid status, CHF/volume overload. Electronically Signed   By: Franki Cabot M.D.   On: 09/03/2019 05:47    Scheduled Meds: . Chlorhexidine Gluconate Cloth  6 each Topical Daily  . Chlorhexidine Gluconate Cloth  6 each Topical Q0600  . cyanocobalamin  1,000 mcg Intramuscular Q30 days  . darbepoetin (ARANESP) injection - DIALYSIS  100 mcg Intravenous Q Sun-HD  . [START ON 09/05/2019] doxercalciferol  2 mcg Intravenous Q T,Th,Sa-HD    Continuous Infusions:   LOS: 1 day     Kayleen Memos, MD [facility] Pager [phone]  If 7PM-7AM, please contact night-coverage [email or web address] Password [facility] 09/03/2019, 12:30 PM

## 2019-09-03 NOTE — Progress Notes (Signed)
MEWs has been yellow per night shift RN, will continue with Q 4 hour VS.

## 2019-09-03 NOTE — Progress Notes (Signed)
PHARMACY - PHYSICIAN COMMUNICATION CRITICAL VALUE ALERT - BLOOD CULTURE IDENTIFICATION (BCID)  Bailey [name] is an 69 y.o. female who presented to [facility] on 09/02/2019 with a chief complaint of fever and generalized weakness with anemia from dialysis center.   Assessment:  Patient has 4 out of 4 blood cultures with GPC in clusters. The BCID is positive for MSSA. WBC is normal. T-max is 100.8 - now trending down. Serum creatinine is 2.66 - per notes, patient has recently started dialysis and is receiving dialysis every Tuesday, Thursday, and Saturday. She does make some urine -300 mL since admission yesterday PM.   Name of physician (or Provider) Contacted: Dr. Nevada Crane  Current antibiotics: Vancomycin and Cefepime  Changes to prescribed antibiotics recommended:  Recommendation to discontinue Vancomycin accepted. Recommendation to narrow to Ancef declined at this time. Continuing Cefepime for now as work to identify source of infection.   Dosing:  Adjust Cefepime to 1g IV every 24 hours for now with need for dialysis/low UOP and follow-up dialysis plans per nephrology.   Results for orders placed or performed during the hospital encounter of 09/02/19  Blood Culture ID Panel (Reflexed) (Collected: 09/02/2019  4:14 PM)  Result Value Ref Range   Enterococcus species NOT DETECTED NOT DETECTED   Listeria monocytogenes NOT DETECTED NOT DETECTED   Staphylococcus species DETECTED (A) NOT DETECTED   Staphylococcus aureus (BCID) DETECTED (A) NOT DETECTED   Methicillin resistance NOT DETECTED NOT DETECTED   Streptococcus species NOT DETECTED NOT DETECTED   Streptococcus agalactiae NOT DETECTED NOT DETECTED   Streptococcus pneumoniae NOT DETECTED NOT DETECTED   Streptococcus pyogenes NOT DETECTED NOT DETECTED   Acinetobacter baumannii NOT DETECTED NOT DETECTED   Enterobacteriaceae species NOT DETECTED NOT DETECTED   Enterobacter cloacae complex NOT DETECTED NOT DETECTED   Escherichia coli NOT  DETECTED NOT DETECTED   Klebsiella oxytoca NOT DETECTED NOT DETECTED   Klebsiella pneumoniae NOT DETECTED NOT DETECTED   Proteus species NOT DETECTED NOT DETECTED   Serratia marcescens NOT DETECTED NOT DETECTED   Haemophilus influenzae NOT DETECTED NOT DETECTED   Neisseria meningitidis NOT DETECTED NOT DETECTED   Pseudomonas aeruginosa NOT DETECTED NOT DETECTED   Candida albicans NOT DETECTED NOT DETECTED   Candida glabrata NOT DETECTED NOT DETECTED   Candida krusei NOT DETECTED NOT DETECTED   Candida parapsilosis NOT DETECTED NOT DETECTED   Candida tropicalis NOT DETECTED NOT DETECTED    Sloan Leiter, PharmD, BCPS, BCCCP Clinical Pharmacist Please refer to [facility] for [place] numbers 09/03/2019  9:19 AM

## 2019-09-03 NOTE — Consult Note (Signed)
[facility]       Reason for Consult: bacteremia    Referring Physician: CHAMP [employer]  Principal Problem:   Symptomatic anemia Active Problems:   ESRD on dialysis ([place])   Benign essential HTN   Thrombocytopenia (HCC)   Hypokalemia   Fever of unknown origin   . Chlorhexidine Gluconate Cloth  6 each Topical Daily  . Chlorhexidine Gluconate Cloth  6 each Topical Q0600  . darbepoetin (ARANESP) injection - DIALYSIS  100 mcg Intravenous Q Sun-HD  . [START ON 09/05/2019] doxercalciferol  2 mcg Intravenous Q T,Th,Sa-HD    Recommendations: Cefazolin Line holiday  Repeat blood cultures  TTE  Assessment: She has a TDC and now with bacteremia.  Nephrology already appropriately planning a line holiday, potentially doesn't need a new line if the fistula is accessible.  Will repeat blood cultures after the line comes out.  Has a fistula in the left arm.    Antibiotics: Vancomycin and cefepime  HPI: Bailey [name] is a 69 y.o. female with ESRD on IHD since June of this year who came in with fatigue, weakness and evaluation including blood cultures now positive with MSSA.  Husband at the bedside and gives the history.  She was recently admitted with anemia and is followed by hematology and receives iron infusions and B12.  CXR with vascular congestion.     Review of Systems:  Constitutional: positive for fevers, chills, sweats, fatigue and malaise Gastrointestinal: negative for diarrhea All other systems reviewed and are negative    Past Medical History:  Diagnosis Date  . Anemia    patient preference - stopped iron   . ARF (acute renal failure) ([place]) 04/2016   dehydration  . Chronic kidney disease    stage 5  . Depression   . Diabetes mellitus    Type II - pt preference - stopped lantus  . Diabetic retinopathy ([place])   . Diverticulitis   . History of kidney stones    passed- 6  . Hypertension   . Osteomyelitis ([place])   . Vitamin B 12  deficiency 06/09/2017  . Vitamin D deficiency   . Wears partial dentures    upper    Social History   Tobacco Use  . Smoking status: Never Smoker  . Smokeless tobacco: Never Used  Vaping Use  . Vaping Use: Never used  Substance Use Topics  . Alcohol use: No    Alcohol/week: 0.0 standard drinks  . Drug use: No    Family History  Problem Relation Age of Onset  . Diabetes Mother   . Asthma Sister     No Known Allergies  Physical Exam: Constitutional: in no apparent distress  Vitals:   09/03/19 0740 09/03/19 0924  BP: (!) 154/66   Pulse: 85   Resp: 20 16  Temp: 98 F (36.7 C)   SpO2: 94% 97%   EYES: anicteric Cardiovascular: Cor RRR Respiratory: clear; GI: soft Musculoskeletal: no pedal edema noted Skin: negatives: no rash Neuro: non-focal  Lab Results  Component Value Date   WBC 7.2 09/03/2019   HGB 8.5 (L) 09/03/2019   HCT 26.8 (L) 09/03/2019   MCV 94.7 09/03/2019   PLT 80 (L) 09/03/2019    Lab Results  Component Value Date   CREATININE 2.66 (H) 09/03/2019   BUN 15 09/03/2019   NA 138 09/03/2019   K 3.7 09/03/2019   CL 97 (L) 09/03/2019   CO2 28 09/03/2019    Lab Results  Component Value Date   ALT 21 09/03/2019   AST 24 09/03/2019   ALKPHOS 73 09/03/2019     Microbiology: Recent Results (from the past 240 hour(s))  Culture, blood (Routine x 2)     Status: None (Preliminary result)   Collection Time: 09/02/19  4:10 PM   Specimen: BLOOD  Result Value Ref Range Status   Specimen Description BLOOD BLOOD LEFT FOREARM  Final   Special Requests   Final    BOTTLES DRAWN AEROBIC AND ANAEROBIC Blood Culture adequate volume   Culture  Setup Time   Final    GRAM POSITIVE COCCI IN CLUSTERS IN BOTH AEROBIC AND ANAEROBIC BOTTLES Performed at [facility], [place] [address] [place], [place] [address]    Culture PENDING  Incomplete   Report Status PENDING  Incomplete  Culture, blood (Routine x 2)     Status: None (Preliminary result)    Collection Time: 09/02/19  4:14 PM   Specimen: BLOOD  Result Value Ref Range Status   Specimen Description BLOOD SITE NOT SPECIFIED  Final   Special Requests   Final    BOTTLES DRAWN AEROBIC AND ANAEROBIC Blood Culture results may not be optimal due to an excessive volume of blood received in culture bottles   Culture  Setup Time   Final    GRAM POSITIVE COCCI IN CLUSTERS IN BOTH AEROBIC AND ANAEROBIC BOTTLES CRITICAL RESULT CALLED TO, READ BACK BY AND VERIFIED WITH: PHARMD K. PIERCE [phone] FCP Performed at [facility], [place] [address] [place], [place] [address]    Culture GRAM POSITIVE COCCI  Final   Report Status PENDING  Incomplete  Blood Culture ID Panel (Reflexed)     Status: Abnormal   Collection Time: 09/02/19  4:14 PM  Result Value Ref Range Status   Enterococcus species NOT DETECTED NOT DETECTED Final   Listeria monocytogenes NOT DETECTED NOT DETECTED Final   Staphylococcus species DETECTED (A) NOT DETECTED Final    Comment: CRITICAL RESULT CALLED TO, READ BACK BY AND VERIFIED WITH: PHARMD K. PIERCE [phone] FCP    Staphylococcus aureus (BCID) DETECTED (A) NOT DETECTED Final    Comment: Methicillin (oxacillin) susceptible Staphylococcus aureus (MSSA). Preferred therapy is anti staphylococcal beta lactam antibiotic (Cefazolin or Nafcillin), unless clinically contraindicated. CRITICAL RESULT CALLED TO, READ BACK BY AND VERIFIED WITH: PHARMD K. PIERCE [phone] FCP    Methicillin resistance NOT DETECTED NOT DETECTED Final   Streptococcus species NOT DETECTED NOT DETECTED Final   Streptococcus agalactiae NOT DETECTED NOT DETECTED Final   Streptococcus pneumoniae NOT DETECTED NOT DETECTED Final   Streptococcus pyogenes NOT DETECTED NOT DETECTED Final   Acinetobacter baumannii NOT DETECTED NOT DETECTED Final   Enterobacteriaceae species NOT DETECTED NOT DETECTED Final   Enterobacter cloacae complex NOT DETECTED NOT DETECTED Final   Escherichia coli NOT DETECTED  NOT DETECTED Final   Klebsiella oxytoca NOT DETECTED NOT DETECTED Final   Klebsiella pneumoniae NOT DETECTED NOT DETECTED Final   Proteus species NOT DETECTED NOT DETECTED Final   Serratia marcescens NOT DETECTED NOT DETECTED Final   Haemophilus influenzae NOT DETECTED NOT DETECTED Final   Neisseria meningitidis NOT DETECTED NOT DETECTED Final   Pseudomonas aeruginosa NOT DETECTED NOT DETECTED Final   Candida albicans NOT DETECTED NOT DETECTED Final   Candida glabrata NOT DETECTED NOT DETECTED Final   Candida krusei NOT DETECTED NOT DETECTED Final   Candida parapsilosis NOT DETECTED NOT DETECTED Final   Candida tropicalis NOT DETECTED NOT DETECTED  Final    Comment: Performed at [facility], [place] [address] [place], [place] [address]  SARS Coronavirus 2 by RT PCR (hospital order, performed in [facility] hospital lab) Nasopharyngeal Nasopharyngeal Swab     Status: None   Collection Time: 09/02/19  5:19 PM   Specimen: Nasopharyngeal Swab  Result Value Ref Range Status   SARS Coronavirus 2 NEGATIVE NEGATIVE Final    Comment: (NOTE) SARS-CoV-2 target nucleic acids are NOT DETECTED.  The SARS-CoV-2 RNA is generally detectable in upper and lower respiratory specimens during the acute phase of infection. The lowest concentration of SARS-CoV-2 viral copies this assay can detect is 250 copies / mL. A negative result does not preclude SARS-CoV-2 infection and should not be used as the sole basis for treatment or other patient management decisions.  A negative result may occur with improper specimen collection / handling, submission of specimen other than nasopharyngeal swab, presence of viral mutation(s) within the areas targeted by this assay, and inadequate number of viral copies (<250 copies / mL). A negative result must be combined with clinical observations, patient history, and epidemiological information.  Fact Sheet for Patients:    StrictlyIdeas.no  Fact Sheet for Healthcare Providers: BankingDealers.co.za  This test is not yet approved or  cleared by the [place] FDA and has been authorized for detection and/or diagnosis of SARS-CoV-2 by FDA under an Emergency Use Authorization (EUA).  This EUA will remain in effect (meaning this test can be used) for the duration of the COVID-19 declaration under Section 564(b)(1) of the Act, 21 U.S.C. section 360bbb-3(b)(1), unless the authorization is terminated or revoked sooner.  Performed at [facility], [place] [address] [place], [place] [address]   MRSA PCR Screening     Status: None   Collection Time: 09/03/19  5:58 AM   Specimen: Nasopharyngeal  Result Value Ref Range Status   MRSA by PCR NEGATIVE NEGATIVE Final    Comment:        The GeneXpert MRSA Assay (FDA approved for NASAL specimens only), is one component of a comprehensive MRSA colonization surveillance program. It is not intended to diagnose MRSA infection nor to guide or monitor treatment for MRSA infections. Performed at [facility], [place] [address], [place], [place] [address]     Marijke Guadiana W Ionia Schey, [place] Infectious Disease [facility] [email or web address] 09/03/2019, 11:48 AM

## 2019-09-04 ENCOUNTER — Inpatient Hospital Stay (HOSPITAL_COMMUNITY): Payer: Medicare HMO

## 2019-09-04 DIAGNOSIS — I340 Nonrheumatic mitral (valve) insufficiency: Secondary | ICD-10-CM

## 2019-09-04 HISTORY — PX: IR REMOVAL TUN CV CATH W/O FL: IMG2289

## 2019-09-04 LAB — BASIC METABOLIC PANEL
Anion gap: 11 (ref 5–15)
BUN: 11 mg/dL (ref 8–23)
CO2: 27 mmol/L (ref 22–32)
Calcium: 7.9 mg/dL — ABNORMAL LOW (ref 8.9–10.3)
Chloride: 101 mmol/L (ref 98–111)
Creatinine, Ser: 1.97 mg/dL — ABNORMAL HIGH (ref 0.44–1.00)
GFR calc Af Amer: 29 mL/min — ABNORMAL LOW (ref >60)
GFR calc non Af Amer: 25 mL/min — ABNORMAL LOW (ref >60)
Glucose, Bld: 115 mg/dL — ABNORMAL HIGH (ref 70–99)
Potassium: 3 mmol/L — ABNORMAL LOW (ref 3.5–5.1)
Sodium: 139 mmol/L (ref 135–145)

## 2019-09-04 LAB — BPAM RBC
Blood Product Expiration Date: 202108052359
Blood Product Expiration Date: 202108082359
ISSUE DATE / TIME: 202107101814
ISSUE DATE / TIME: 202107110018
Unit Type and Rh: 600
Unit Type and Rh: 600

## 2019-09-04 LAB — TYPE AND SCREEN
ABO/RH(D): A NEG
Antibody Screen: NEGATIVE
Unit division: 0
Unit division: 0

## 2019-09-04 LAB — CBC WITH DIFFERENTIAL/PLATELET
Abs Immature Granulocytes: 0.02 10*3/uL (ref 0.00–0.07)
Basophils Absolute: 0 10*3/uL (ref 0.0–0.1)
Basophils Relative: 0 %
Eosinophils Absolute: 0 10*3/uL (ref 0.0–0.5)
Eosinophils Relative: 0 %
HCT: 26.6 % — ABNORMAL LOW (ref 36.0–46.0)
Hemoglobin: 8.3 g/dL — ABNORMAL LOW (ref 12.0–15.0)
Immature Granulocytes: 0 %
Lymphocytes Relative: 11 %
Lymphs Abs: 0.8 10*3/uL (ref 0.7–4.0)
MCH: 29.3 pg (ref 26.0–34.0)
MCHC: 31.2 g/dL (ref 30.0–36.0)
MCV: 94 fL (ref 80.0–100.0)
Monocytes Absolute: 0.3 10*3/uL (ref 0.1–1.0)
Monocytes Relative: 5 %
Neutro Abs: 5.6 10*3/uL (ref 1.7–7.7)
Neutrophils Relative %: 84 %
Platelets: DECREASED 10*3/uL (ref 150–400)
RBC: 2.83 MIL/uL — ABNORMAL LOW (ref 3.87–5.11)
RDW: 14.2 % (ref 11.5–15.5)
WBC: 6.8 10*3/uL (ref 4.0–10.5)
nRBC: 0 % (ref 0.0–0.2)

## 2019-09-04 LAB — VITAMIN B12: Vitamin B-12: 755 pg/mL (ref 180–914)

## 2019-09-04 LAB — ECHOCARDIOGRAM COMPLETE
Height: 63 in
Weight: 1851.86 oz

## 2019-09-04 LAB — URINE CULTURE: Culture: <10000 — AB

## 2019-09-04 LAB — PROCALCITONIN: Procalcitonin: 52.51 ng/mL

## 2019-09-04 MED ORDER — CARVEDILOL 3.125 MG PO TABS
3.1250 mg | ORAL_TABLET | Freq: Two times a day (BID) | ORAL | Status: DC
  Administered 2019-09-04 – 2019-09-08 (×8): 3.125 mg via ORAL
  Filled 2019-09-04 (×8): qty 1

## 2019-09-04 MED ORDER — LIDOCAINE HCL 1 % IJ SOLN
INTRAMUSCULAR | Status: AC
  Filled 2019-09-04: qty 20

## 2019-09-04 MED ORDER — DARBEPOETIN ALFA 100 MCG/0.5ML IJ SOSY
100.0000 ug | PREFILLED_SYRINGE | INTRAMUSCULAR | Status: DC
  Administered 2019-09-05: 100 ug via INTRAVENOUS
  Filled 2019-09-04: qty 0.5

## 2019-09-04 MED ORDER — CHLORHEXIDINE GLUCONATE 4 % EX LIQD
CUTANEOUS | Status: AC
  Filled 2019-09-04: qty 15

## 2019-09-04 MED ORDER — HEPARIN SODIUM (PORCINE) 1000 UNIT/ML IJ SOLN
INTRAMUSCULAR | Status: AC
  Filled 2019-09-04: qty 4

## 2019-09-04 MED ORDER — CARVEDILOL 25 MG PO TABS: 25.0000 mg | ORAL_TABLET | Freq: Two times a day (BID) | ORAL | Status: DC

## 2019-09-04 NOTE — Progress Notes (Addendum)
[facility] Progress Note   Subjective: Seen in room. Says she has new catheter and is getting hemodialysis-none of which is true. Oriented to person and place but unable to recall recent events. Scheduled for [facility] removal today.   Has AVF placed 06/08/2019 that should be ready for use.   Objective Vitals:   09/04/19 0418 09/04/19 0425 09/04/19 0620 09/04/19 0817  BP: (!) 143/62 (!) 147/62 (!) 146/55 (!) 141/65  Pulse:  64  68  Resp:  (!) 25 (!) 22 20  Temp:  99 F (37.2 C)  99 F (37.2 C)  TempSrc:  Oral  Oral  SpO2:  100% 100% 96%  Weight:   52.5 kg   Height:       Physical Exam General: pale, chronically ill appearing pt in NAD Heart: S1,S2, RRR SR on monitor, rate 60s.  Lungs: Decreased in bases with bibasilar crackles. No WOB.   Abdomen: S, NT Active BS Extremities: No LE edema Dialysis Access: RIJ TDC drsg intact. Order to be removed in IR today. L AVF with T/B-bruit shrill mid AVF. Looks ready to use.    Additional Objective Labs: Basic Metabolic Panel: Recent Labs  Lab 08/29/19 1714 09/02/19 1614 09/02/19 1811 09/03/19 0340  NA 142 138  --  138  K 3.0* 3.0*  --  3.7  CL 98 98  --  97*  CO2 34* 31  --  28  GLUCOSE 176* 134*  --  192*  BUN 5* 6*  --  15  CREATININE 1.88* 1.75*  --  2.66*  CALCIUM 8.0* 7.9*  --  8.2*  PHOS  --   --  2.5  --    Liver Function Tests: Recent Labs  Lab 09/02/19 1614 09/03/19 0340  AST 22 24  ALT 17 21  ALKPHOS 72 73  BILITOT 0.5 0.9  PROT 5.6* 5.6*  ALBUMIN 2.5* 2.5*   No results for input(s): LIPASE, AMYLASE in the last 168 hours. CBC: Recent Labs  Lab 08/29/19 1714 09/02/19 1614 09/03/19 0340  WBC 5.8 7.9 7.2  NEUTROABS  --  7.0  --   HGB 7.7* 5.8* 8.5*  HCT 25.2* 19.1* 26.8*  MCV 96.9 95.5 94.7  PLT 104* PLATELET CLUMPS NOTED ON SMEAR, COUNT APPEARS DECREASED 80*   Blood Culture    Component Value Date/Time   SDES URINE, RANDOM 09/03/2019 0558   SPECREQUEST NONE 09/03/2019 0558   CULT  (A) 09/03/2019 0558    <10,000 COLONIES/mL INSIGNIFICANT GROWTH Performed at [facility] [address] [place], [place] [address]    REPTSTATUS 09/04/2019 FINAL 09/03/2019 0558    Cardiac Enzymes: No results for input(s): CKTOTAL, CKMB, CKMBINDEX, TROPONINI in the last 168 hours. CBG: Recent Labs  Lab 09/02/19 2130 09/03/19 0820 09/03/19 1158 09/03/19 1609 09/03/19 2034  GLUCAP 141* 131* 150* 158* 188*   Iron Studies: No results for input(s): IRON, TIBC, TRANSFERRIN, FERRITIN in the last 72 hours. $RemoveB'@lablastinr3'[id number]$ @ Studies/Results: DG Chest 2 View  Result Date: 09/02/2019 CLINICAL DATA:  Weakness, suspected sepsis EXAM: CHEST - 2 VIEW COMPARISON:  04/13/2010 FINDINGS: Frontal and lateral views of the chest demonstrate right internal jugular dialysis catheter tip in the region of the superior vena cava. Cardiac silhouette is enlarged. There is mild diffuse interstitial prominence without airspace disease, effusion, or pneumothorax. No acute bony abnormalities. IMPRESSION: 1. Mild diffuse interstitial prominence, likely chronic. No superimposed airspace disease. Electronically Signed   By: Randa Ngo M.D.   On: 09/02/2019 17:04  DG CHEST PORT 1 VIEW  Result Date: 09/03/2019 CLINICAL DATA:  Acute respiratory failure with hypoxia. EXAM: PORTABLE CHEST 1 VIEW COMPARISON:  Chest x-ray dated 09/02/2019. FINDINGS: Stable cardiomegaly. RIGHT chest wall catheter is stable in position with tip at the level of the lower SVC. Increased central pulmonary vascular congestion and bilateral interstitial prominence. No confluent opacity to suggest consolidating pneumonia. No pleural effusion or pneumothorax is seen. IMPRESSION: Increased central pulmonary vascular congestion and bilateral interstitial prominence, suggesting worsening fluid status, CHF/volume overload. Electronically Signed   By: Franki Cabot M.D.   On: 09/03/2019 05:47   Medications: .  ceFAZolin (ANCEF) IV 1 g (09/03/19  1446)   . carvedilol  3.125 mg Oral BID WC  . Chlorhexidine Gluconate Cloth  6 each Topical Daily  . Chlorhexidine Gluconate Cloth  6 each Topical Q0600  . cyanocobalamin  1,000 mcg Intramuscular Q30 days  . darbepoetin (ARANESP) injection - DIALYSIS  100 mcg Intravenous Q Sun-HD  . [START ON 09/05/2019] doxercalciferol  2 mcg Intravenous Q T,Th,Sa-HD  . heparin sodium (porcine)         TTS - [facility]  3.5hrs, BFR 400, DFR AF 1.5,  EDW 56kg, 2K/2.25Ca  Access: RIJ TDC, LU AVF ready for use Heparin none Mircera 75 mcg IV q2wks - last 08/19/19 Venofer $RemoveBeforeD'50mg'[id number]$  IV qwk Hectorol 2 mcg IV qHD   Assessment/Plan: 1.  MSSA bacteremia - tmax 99.3 last 24hrs.  BC +MSSA.  On Vanc/Cefepime.  Etiology unclear, could be catheter related. Seen by ID. Plan to remove catheter after dialysis. Has L AVF placed 06/08/2019. Should be ready for use. Hopefully can avoid replacing line.  2. Volume overload - CXR with vascular congestion/pulm edema on adm. HD 07/11 Net UF 3 liters. Still with few bibasilar crackles but overt volume overload seems resolved. No WOB.  3. Confusion: Unclear if this is pre-existing issue or related to current sepsis. Per primary.  4.  ESRD -  New start on HD TTS. No immediate HD needs today. Next HD 09/05/2019.  IR consulted for [facility] removal.  AVF ready for use on 7/15, may be able to start using a few days early, if able to cannulate successfully will not need to have new catheter placed.   5.  Hypertension  -BP controlled post HD. Continue home BP meds.  No evidence of overt volume overload at present.  6.  Anemia of CKD - Hgb initially 5.8 in ED, improved to 8.5 s/p 2units PRBC. Did not get Aranesp with HD last night. Reorder for 07/13. HGB pending.  7.  Secondary Hyperparathyroidism -  Ca and phos in goal. Continue VDRA. Not on binders.  8.  Nutrition - Renal diet w/fluid restrictions. Alb 2.5, protein supplements.  9. DMT2 - per primary  Jarrah Babich H. Alek Poncedeleon NP-C 09/04/2019, 8:58 AM   [employer] [phone]

## 2019-09-04 NOTE — Progress Notes (Signed)
PROGRESS NOTE  Bailey [name] EVO:[id number] DOB: 04-Jul-1950 DOA: 09/02/2019 PCP: [name], MD  HPI/Recap of past 24 hours: HPI: Bailey [name] is a 69 y.o. female with medical history significant of ESRD recently started on dialysis (TTS), diet-controlled type 2 diabetes mellitus, anemia of chronic disease, history of osteomyelitis, hypertension came from dialysis with complaint of generalized weakness for a few days.    Patient's husband at bedside is the historian.  She has been feeling weak, lethargic, pale, cold however her symptoms started getting worse the morning of her presentation.Marland Kitchen  She was recently admitted on 6/27 with bleeding from dialysis site and received 2 unit PRBC during that admission and discharged home in stable condition on same day.  She presented to ED on 7/6 with recurrent bleeding from dialysis catheter after dialysis which was controlled with direct pressure and pressure dressing along with thrombin pad and discharged home in stable condition.  Patient denies bleeding from dialysis site or bleeding per rectum.  No history of over-the-counter NSAID use.  She is not on anticoagulation.  Reports fever with chills and chronic dry cough however denies sputum production, chest congestion, wheezing, urinary symptoms such as dysuria, hematuria, change in urinary frequency, abdominal pain, diarrhea, joint pain or rashes.  She received COVID-19 vaccine couple of months ago.  She receives iron and B12 shots once in a month at hematologist office.  She underwent bone marrow biopsy on 01/20/2019-findings were related to chronic kidney disease.  Flow cytometry was negative.  Platelet clumping noted suggestive of pseudothrombocytopenia.  ED Course: Upon arrival to ED patient had fever of 101.8, no leukocytosis, hemoglobin 5.8.  EDP ordered 2 unit PRBCs.  [facility] hospital[facility] consulted for admission for symptomatic anemia and fever of unknown etiology.   Blood cultures 2/2  bottles positive for MSSA.  She received IV vancomycin and cefepime on admission.  2D echo ordered to rule out endocarditis.  Repeat blood cultures peripherally x2 planned for 09/04/2018 AM.  Infectious disease has been consulted per protocol.   In acute respiratory distress overnight after starting blood transfusion with suspicion for TACO on 09/03/19.  Received 1 dose of IV Lasix 80 mg x 1   09/04/19: Seen and examined at bedside.  States she feels tired this am.  On 4L with O2 sat 100% on the monitor in the room.   Assessment/Plan: Principal Problem:   Symptomatic anemia Active Problems:   ESRD on dialysis (HCC)   Benign essential HTN   Thrombocytopenia (HCC)   Hypokalemia   Fever of unknown origin  Generalized weakness, likely multifactorial secondary to MSSA bacteremia, symptomatic anemia  Presented with hemoglobin of 5.8, 2 unit PRBCs ordered to be transfused, after 1 unit PRBC transfusion she developed suspected TACO and transfusion was stopped.  She received 1 dose of IV Lasix 80 mg x 1.  Was placed on oxygen supplementation, currently on 3 L nasal cannula.  Blood cultures x2+ for MSSA Infectious disease has been consulted for antibiotics recommendations Repeat blood cultures x2 peripherally on 09/04/2019 Continue fall precautions Continue to monitor H&H  Sepsis secondary to MSSA bacteremia Presented with a fever with T-max of 101.8, tachypnea with respiration rate of 29 in the setting of MSSA bacteremia 2 out of 2 bottles. MRSA screen negative on 09/03/2019. Unknown source at this point Blood cultures drawn on 09/02/2019 + for MSSA at 2 out of 2 bottles. 2D echo ordered to rule out infective endocarditis Repeat blood cultures peripherally x2 on 09/04/2019 Pharmacy consulted  for Ancef procalcitonin up trending 52 from 25 Infectious disease following She has received a dose of IV vancomycin and cefepime on admission. Monitor fever curve and WBC  Suspected transfusion  associated circulatory overload, TACO Post blood transfusion, this was stopped Received 1 dose of IV Lasix 80 mg x 1 Currently on 3 L nasal cannula Continue to maintain O2 saturation greater than 92% Continue to closely monitor respiratory status  Pulmonary edema in the setting of suspected TACO  independently viewed chest x-ray done early this morning which showed increasing pulmonary vascularity suggestive of pulmonary edema Received diuretics Volume status addressed with hemodialysis Continue to closely monitor and maintain O2 saturation greater than 92%  Acute hypoxic respiratory failure secondary to pulmonary edema Not on oxygen supplementation at baseline Currently requiring 3 L oxygen nasal cannula Management as stated above  ESRD, recently diagnosed, on dialysis TTS -Euvolemic on exam -Appreciate nephrology consulting services.  Hypokalemia\ Serum K+ 3.0 Management per nephrology  Resolved post repletion: Hypokalemia -Presented with serum sodium 3.7  -Serum magnesium 1.7  -Electrolyte repletion per nephrology  Essential hypertension Currently her blood pressure is soft Continue to hold off Coreg and nifedipine Continue to monitor vital signs  Chronic anxiety/depression Resume home escitalopram  Thrombocytopenia, chronic Platelet count 80K from 104K Continue to monitor  Diet-controlled type 2 diabetes mellitus:  Last A1c 5.5% Avoid hypoglycemia in the setting of ESRD on HD  DVT prophylaxis: SCD Code Status: Full code-confirmed with patient's husband Family Communication: Plan of care discussed with patient who verbalized understanding and agreed with it.   Consults called: Nephrology, infectious disease   Status is: Inpatient   Dispo: The patient is from: Home.              Anticipated d/c is to: Home.              Anticipated d/c date is: 09/06/2019               Patient currently not stable for discharge due to ongoing work-up and treatment for  MSSA bacteremia         Objective: Vitals:   09/04/19 0418 09/04/19 0425 09/04/19 0620 09/04/19 0817  BP: (!) 143/62 (!) 147/62 (!) 146/55 (!) 141/65  Pulse:  64  68  Resp:  (!) 25 (!) 22 20  Temp:  99 F (37.2 C)  99 F (37.2 C)  TempSrc:  Oral  Oral  SpO2:  100% 100% 96%  Weight:   52.5 kg   Height:        Intake/Output Summary (Last 24 hours) at 09/04/2019 1552 Last data filed at 09/04/2019 0425 Gross per 24 hour  Intake --  Output 3331 ml  Net -3331 ml   Filed Weights   09/02/19 1602 09/02/19 2049 09/04/19 0620  Weight: 59 kg 59 kg 52.5 kg    Exam:  . General: 69 y.o. year-old female Wd WN NAD alert and interactive . Cardiovascular: RRR no rubs or galllops . Respiratory: Mild rales at bases; poor inspiratory efforts . Abdomen: Soft NT NBS x 4 . Musculoskeletal: Trace LE edema b/l . Psychiatry: Mood is appropriate for condition and setting   Data Reviewed: CBC: Recent Labs  Lab 08/29/19 1714 09/02/19 1614 09/03/19 0340 09/04/19 0853  WBC 5.8 7.9 7.2 6.8  NEUTROABS  --  7.0  --  5.6  HGB 7.7* 5.8* 8.5* 8.3*  HCT 25.2* 19.1* 26.8* 26.6*  MCV 96.9 95.5 94.7 94.0  PLT 104* PLATELET CLUMPS NOTED ON SMEAR,  COUNT APPEARS DECREASED 80* PLATELET CLUMPS NOTED ON SMEAR, COUNT APPEARS DECREASED   Basic Metabolic Panel: Recent Labs  Lab 08/29/19 1714 09/02/19 1614 09/02/19 1811 09/03/19 0340 09/04/19 0853  NA 142 138  --  138 139  K 3.0* 3.0*  --  3.7 3.0*  CL 98 98  --  97* 101  CO2 34* 31  --  28 27  GLUCOSE 176* 134*  --  192* 115*  BUN 5* 6*  --  15 11  CREATININE 1.88* 1.75*  --  2.66* 1.97*  CALCIUM 8.0* 7.9*  --  8.2* 7.9*  MG  --   --  1.7  --   --   PHOS  --   --  2.5  --   --    GFR: Estimated Creatinine Clearance: 22 mL/min (A) (by C-G formula based on SCr of 1.97 mg/dL (H)). Liver Function Tests: Recent Labs  Lab 09/02/19 1614 09/03/19 0340  AST 22 24  ALT 17 21  ALKPHOS 72 73  BILITOT 0.5 0.9  PROT 5.6* 5.6*  ALBUMIN 2.5*  2.5*   No results for input(s): LIPASE, AMYLASE in the last 168 hours. No results for input(s): AMMONIA in the last 168 hours. Coagulation Profile: Recent Labs  Lab 08/29/19 1958 09/02/19 1614 09/03/19 0340  INR 1.2 1.3* 1.5*   Cardiac Enzymes: No results for input(s): CKTOTAL, CKMB, CKMBINDEX, TROPONINI in the last 168 hours. BNP (last 3 results) No results for input(s): PROBNP in the last 8760 hours. HbA1C: No results for input(s): HGBA1C in the last 72 hours. CBG: Recent Labs  Lab 09/02/19 2130 09/03/19 0820 09/03/19 1158 09/03/19 1609 09/03/19 2034  GLUCAP 141* 131* 150* 158* 188*   Lipid Profile: No results for input(s): CHOL, HDL, LDLCALC, TRIG, CHOLHDL, LDLDIRECT in the last 72 hours. Thyroid Function Tests: Recent Labs    09/02/19 1811  TSH 3.050   Anemia Panel: Recent Labs    09/04/19 1229  VITAMINB12 755   Urine analysis:    Component Value Date/Time   COLORURINE YELLOW 09/03/2019 0558   APPEARANCEUR CLEAR 09/03/2019 0558   LABSPEC 1.014 09/03/2019 0558   PHURINE 9.0 (H) 09/03/2019 0558   GLUCOSEU 150 (A) 09/03/2019 0558   HGBUR SMALL (A) 09/03/2019 0558   BILIRUBINUR NEGATIVE 09/03/2019 0558   BILIRUBINUR small 02/10/2014 1127   KETONESUR NEGATIVE 09/03/2019 0558   PROTEINUR >=300 (A) 09/03/2019 0558   UROBILINOGEN 1.0 02/10/2014 2233   NITRITE NEGATIVE 09/03/2019 0558   LEUKOCYTESUR NEGATIVE 09/03/2019 0558   Sepsis Labs: @LABRCNTIP (procalcitonin:4,lacticidven:4)  ) Recent Results (from the past 240 hour(s))  Culture, blood (Routine x 2)     Status: Abnormal (Preliminary result)   Collection Time: 09/02/19  4:10 PM   Specimen: BLOOD  Result Value Ref Range Status   Specimen Description BLOOD BLOOD LEFT FOREARM  Final   Special Requests   Final    BOTTLES DRAWN AEROBIC AND ANAEROBIC Blood Culture adequate volume   Culture  Setup Time   Final    GRAM POSITIVE COCCI IN CLUSTERS IN BOTH AEROBIC AND ANAEROBIC BOTTLES CRITICAL VALUE  NOTED.  VALUE IS CONSISTENT WITH PREVIOUSLY REPORTED AND CALLED VALUE. Performed at [facility], [place] [address] [place], [place] [address]    Culture STAPHYLOCOCCUS AUREUS (A)  Final   Report Status PENDING  Incomplete  Culture, blood (Routine x 2)     Status: Abnormal (Preliminary result)   Collection Time: 09/02/19  4:14 PM   Specimen: BLOOD  Result Value  Ref Range Status   Specimen Description BLOOD SITE NOT SPECIFIED  Final   Special Requests   Final    BOTTLES DRAWN AEROBIC AND ANAEROBIC Blood Culture results may not be optimal due to an excessive volume of blood received in culture bottles   Culture  Setup Time   Final    GRAM POSITIVE COCCI IN CLUSTERS IN BOTH AEROBIC AND ANAEROBIC BOTTLES CRITICAL RESULT CALLED TO, READ BACK BY AND VERIFIED WITH: PHARMD K. PIERCE [phone] FCP Performed at [facility], [place] [address] [place], [place] [address]    Culture STAPHYLOCOCCUS AUREUS (A)  Final   Report Status PENDING  Incomplete  Blood Culture ID Panel (Reflexed)     Status: Abnormal   Collection Time: 09/02/19  4:14 PM  Result Value Ref Range Status   Enterococcus species NOT DETECTED NOT DETECTED Final   Listeria monocytogenes NOT DETECTED NOT DETECTED Final   Staphylococcus species DETECTED (A) NOT DETECTED Final    Comment: CRITICAL RESULT CALLED TO, READ BACK BY AND VERIFIED WITH: PHARMD K. PIERCE [id number] FCP    Staphylococcus aureus (BCID) DETECTED (A) NOT DETECTED Final    Comment: Methicillin (oxacillin) susceptible Staphylococcus aureus (MSSA). Preferred therapy is anti staphylococcal beta lactam antibiotic (Cefazolin or Nafcillin), unless clinically contraindicated. CRITICAL RESULT CALLED TO, READ BACK BY AND VERIFIED WITH: PHARMD K. PIERCE [phone] FCP    Methicillin resistance NOT DETECTED NOT DETECTED Final   Streptococcus species NOT DETECTED NOT DETECTED Final   Streptococcus agalactiae NOT DETECTED NOT DETECTED Final   Streptococcus  pneumoniae NOT DETECTED NOT DETECTED Final   Streptococcus pyogenes NOT DETECTED NOT DETECTED Final   Acinetobacter baumannii NOT DETECTED NOT DETECTED Final   Enterobacteriaceae species NOT DETECTED NOT DETECTED Final   Enterobacter cloacae complex NOT DETECTED NOT DETECTED Final   Escherichia coli NOT DETECTED NOT DETECTED Final   Klebsiella oxytoca NOT DETECTED NOT DETECTED Final   Klebsiella pneumoniae NOT DETECTED NOT DETECTED Final   Proteus species NOT DETECTED NOT DETECTED Final   Serratia marcescens NOT DETECTED NOT DETECTED Final   Haemophilus influenzae NOT DETECTED NOT DETECTED Final   Neisseria meningitidis NOT DETECTED NOT DETECTED Final   Pseudomonas aeruginosa NOT DETECTED NOT DETECTED Final   Candida albicans NOT DETECTED NOT DETECTED Final   Candida glabrata NOT DETECTED NOT DETECTED Final   Candida krusei NOT DETECTED NOT DETECTED Final   Candida parapsilosis NOT DETECTED NOT DETECTED Final   Candida tropicalis NOT DETECTED NOT DETECTED Final    Comment: Performed at [facility], [place] [address] [place], [place] [address]  SARS Coronavirus 2 by RT PCR (hospital order, performed in [facility] hospital lab) Nasopharyngeal Nasopharyngeal Swab     Status: None   Collection Time: 09/02/19  5:19 PM   Specimen: Nasopharyngeal Swab  Result Value Ref Range Status   SARS Coronavirus 2 NEGATIVE NEGATIVE Final    Comment: (NOTE) SARS-CoV-2 target nucleic acids are NOT DETECTED.  The SARS-CoV-2 RNA is generally detectable in upper and lower respiratory specimens during the acute phase of infection. The lowest concentration of SARS-CoV-2 viral copies this assay can detect is 250 copies / mL. A negative result does not preclude SARS-CoV-2 infection and should not be used as the sole basis for treatment or other patient management decisions.  A negative result may occur with improper specimen collection / handling, submission of specimen other than nasopharyngeal swab,  presence of viral mutation(s) within the areas targeted by this assay,  and inadequate number of viral copies (<250 copies / mL). A negative result must be combined with clinical observations, patient history, and epidemiological information.  Fact Sheet for Patients:   StrictlyIdeas.no  Fact Sheet for Healthcare Providers: BankingDealers.co.za  This test is not yet approved or  cleared by the [place] FDA and has been authorized for detection and/or diagnosis of SARS-CoV-2 by FDA under an Emergency Use Authorization (EUA).  This EUA will remain in effect (meaning this test can be used) for the duration of the COVID-19 declaration under Section 564(b)(1) of the Act, 21 U.S.C. section 360bbb-3(b)(1), unless the authorization is terminated or revoked sooner.  Performed at [facility], [place] [address] [place], [place] [address]   Urine culture     Status: Abnormal   Collection Time: 09/03/19  5:58 AM   Specimen: Urine, Random  Result Value Ref Range Status   Specimen Description URINE, RANDOM  Final   Special Requests NONE  Final   Culture (A)  Final    <10,000 COLONIES/mL INSIGNIFICANT GROWTH Performed at [facility], [place] [address] [place], [place] [address]    Report Status 09/04/2019 FINAL  Final  MRSA PCR Screening     Status: None   Collection Time: 09/03/19  5:58 AM   Specimen: Nasopharyngeal  Result Value Ref Range Status   MRSA by PCR NEGATIVE NEGATIVE Final    Comment:        The GeneXpert MRSA Assay (FDA approved for NASAL specimens only), is one component of a comprehensive MRSA colonization surveillance program. It is not intended to diagnose MRSA infection nor to guide or monitor treatment for MRSA infections. Performed at [facility], [place] [address], [place],  [address]       Studies: ECHOCARDIOGRAM COMPLETE  Result Date: 09/04/2019    ECHOCARDIOGRAM REPORT   Patient  Name:   JACLYNE HAVERSTICK [facility] Date of Exam: 09/04/2019 Medical Rec #:  [id number]      Height:       63.0 in Accession #:    [id number]     Weight:       115.7 lb Date of Birth:  13-May-1950      BSA:          1.533 m Patient Age:    24 years       BP:           141/65 mmHg Patient Gender: F              HR:           73 bpm. Exam Location:  Inpatient Procedure: 2D Echo Indications:    bacteremia 790.7  History:        Patient has no prior history of Echocardiogram examinations. End                 stage renal disease, Signs/Symptoms:Bacteremia and Fever; Risk                 Factors:Hypertension and Diabetes.  Sonographer:    Johny Chess Referring Phys: [id number] [place]  1. Left ventricular ejection fraction, by estimation, is 50 to 55%. The left ventricle has low normal function. The left ventricle has no regional wall motion abnormalities. Left ventricular diastolic parameters are consistent with Grade II diastolic dysfunction (pseudonormalization).  2. Right ventricular systolic function is normal. The right ventricular size is normal.  3. Left atrial size was severely dilated.  4. The mitral  valve is normal in structure. Mild mitral valve regurgitation. No evidence of mitral stenosis.  5. The aortic valve is normal in structure. Aortic valve regurgitation is not visualized. Mild to moderate aortic valve sclerosis/calcification is present, without any evidence of aortic stenosis.  6. The inferior vena cava is dilated in size with <50% respiratory variability, suggesting right atrial pressure of 15 mmHg. FINDINGS  Left Ventricle: Left ventricular ejection fraction, by estimation, is 50 to 55%. The left ventricle has low normal function. The left ventricle has no regional wall motion abnormalities. The left ventricular internal cavity size was normal in size. There is no left ventricular hypertrophy. Left ventricular diastolic parameters are consistent with Grade II diastolic dysfunction  (pseudonormalization). Right Ventricle: The right ventricular size is normal.Right ventricular systolic function is normal. Left Atrium: Left atrial size was severely dilated. Right Atrium: Right atrial size was normal in size. Pericardium: Trivial pericardial effusion is present. Mitral Valve: The mitral valve is normal in structure. Normal mobility of the mitral valve leaflets. Moderate mitral annular calcification. Mild mitral valve regurgitation. No evidence of mitral valve stenosis. Tricuspid Valve: The tricuspid valve is normal in structure. Tricuspid valve regurgitation is trivial. No evidence of tricuspid stenosis. Aortic Valve: The aortic valve is normal in structure. Aortic valve regurgitation is not visualized. Mild to moderate aortic valve sclerosis/calcification is present, without any evidence of aortic stenosis. Pulmonic Valve: The pulmonic valve was normal in structure. Pulmonic valve regurgitation is trivial. No evidence of pulmonic stenosis. Aorta: The aortic root is normal in size and structure. Venous: The inferior vena cava is dilated in size with less than 50% respiratory variability, suggesting right atrial pressure of 15 mmHg.  LEFT VENTRICLE PLAX 2D LVIDd:         5.20 cm  Diastology LVIDs:         3.40 cm  LV e' lateral:   7.07 cm/s LV PW:         1.00 cm  LV E/e' lateral: 19.7 LV IVS:        0.80 cm  LV e' medial:    7.40 cm/s LVOT diam:     2.30 cm  LV E/e' medial:  18.8 LV SV:         98 LV SV Index:   64 LVOT Area:     4.15 cm  RIGHT VENTRICLE             IVC RV S prime:     13.70 cm/s  IVC diam: 2.80 cm TAPSE (M-mode): 2.2 cm LEFT ATRIUM              Index       RIGHT ATRIUM           Index LA diam:        4.00 cm  2.61 cm/m  RA Area:     14.50 cm LA Vol (A2C):   105.0 ml 68.51 ml/m RA Volume:   33.60 ml  21.92 ml/m LA Vol (A4C):   56.2 ml  36.67 ml/m LA Biplane Vol: 79.7 ml  52.00 ml/m  AORTIC VALVE LVOT Vmax:   122.00 cm/s LVOT Vmean:  79.800 cm/s LVOT VTI:    0.237 m  AORTA  Ao Root diam: 3.10 cm Ao Asc diam:  3.30 cm MITRAL VALVE MV Area (PHT): 3.72 cm     SHUNTS MV Decel Time: 204 msec     Systemic VTI:  0.24 m MV E velocity: 139.00 cm/s  Systemic Diam: 2.30 cm  MV A velocity: 110.00 cm/s MV E/A ratio:  1.26 Kirk Ruths MD Electronically signed by Kirk Ruths MD Signature Date/Time: 09/04/2019/2:37:34 PM    Final     Scheduled Meds: . carvedilol  3.125 mg Oral BID WC  . chlorhexidine      . Chlorhexidine Gluconate Cloth  6 each Topical Daily  . Chlorhexidine Gluconate Cloth  6 each Topical Q0600  . cyanocobalamin  1,000 mcg Intramuscular Q30 days  . [START ON 09/05/2019] darbepoetin (ARANESP) injection - DIALYSIS  100 mcg Intravenous Q Tue-HD  . [START ON 09/05/2019] doxercalciferol  2 mcg Intravenous Q T,Th,Sa-HD  . heparin sodium (porcine)      . lidocaine        Continuous Infusions: .  ceFAZolin (ANCEF) IV 1 g (09/04/19 1312)     LOS: 2 days     Kayleen Memos, MD [facility] Pager [phone]  If 7PM-7AM, please contact night-coverage [email or web address] Password [facility] 09/04/2019, 3:52 PM

## 2019-09-04 NOTE — Procedures (Signed)
PROCEDURE SUMMARY:  Successful removal of right IJ tunneled HD catheter, removed intact. No immediate complications.  EBL < 2 mL. Pressure dressing (gauze + tegaderm) applied to site. Patient tolerated well.   Please see imaging section of Epic for full dictation.   Claris Pong Tamana Hatfield PA-C 09/04/2019 4:01 PM

## 2019-09-04 NOTE — Progress Notes (Signed)
   09/04/19 0425  Hand-Off documentation  Handoff Given Given to shift RN/LPN  Report given to (Full Name) judith  Handoff Received Received from shift RN/LPN  Report received from (Full Name) Darcel Frane  Vital Signs  Temp 99 F (37.2 C)  Temp Source Oral  Pulse Rate 64  Pulse Rate Source Monitor  Resp (!) 25  BP (!) 147/62  BP Location Right Arm  BP Method Automatic  Patient Position (if appropriate) Lying  Oxygen Therapy  SpO2 100 %  O2 Device Nasal Cannula  O2 Flow Rate (L/min) 4 L/min  Post-Hemodialysis Assessment  Rinseback Volume (mL) 250 mL  KECN 287 V  Dialyzer Clearance Lightly streaked  Duration of HD Treatment -hour(s) 3 hour(s)  Hemodialysis Intake (mL) 500 mL  UF Total -Machine (mL) 3531 mL  Net UF (mL) 3031 mL  Tolerated HD Treatment Yes  Post-Hemodialysis Comments tx achieved as expected, pt stable  AVG/AVF Arterial Site Held (minutes) 0 minutes  AVG/AVF Venous Site Held (minutes) 0 minutes  Education / Care Plan  Dialysis Education Provided Yes  Hemodialysis Catheter Right Subclavian  Placement Date/Time: 08/16/19 1200   Placed prior to admission: No  Time Out: Correct patient;Correct site;Correct procedure  Maximum sterile barrier precautions: Hand hygiene;Cap;Mask;Sterile gown;Sterile gloves;Large sterile sheet  Person Inserting ...  Site Condition No complications  Blue Lumen Status Heparin locked;Capped (Central line)  Red Lumen Status Heparin locked;Capped (Central line)  Catheter fill solution Heparin 1000 units/ml  Catheter fill volume (Arterial) 1.6 cc  Catheter fill volume (Venous) 1.6  Dressing Type Gauze/Drain sponge  Dressing Status Old drainage  Drainage Description None

## 2019-09-04 NOTE — TOC Initial Note (Signed)
Transition of Care [facility]) - Initial/Assessment Note    Patient Details  Name: Bailey [name] MRN: [id number] Date of Birth: May 12, 1950  Transition of Care [facility]) CM/SW Contact:    Ninfa Meeker, RN Phone Number: [phone] (working remotely) 09/04/2019, 1:29 PM  Clinical Narrative: Patient is a 69 y.o. female with medical history significant of ESRD recently started on dialysis (TTS), diet-controlled type 2 diabetes mellitus, anemia of chronic disease, history of osteomyelitis, hypertension came from dialysis with complaint of generalized weakness for a few days. On IV antibiotics. From Home with husband. Per notes patient slightly confused today to events and situation. TOC Team will continue to monitor for needs.                    Barriers to Discharge: Continued Medical Work up   Patient Goals and CMS Choice        Expected Discharge Plan and Services         Living arrangements for the past 2 months: Single Family Home                                      Prior Living Arrangements/Services Living arrangements for the past 2 months: Single Family Home Lives with:: Spouse                   Activities of Daily Living Home Assistive Devices/Equipment: Other (Comment) (unknown) ADL Screening (condition at time of admission) Patient's cognitive ability adequate to safely complete daily activities?: Yes Is the patient deaf or have difficulty hearing?: No Does the patient have difficulty seeing, even when wearing glasses/contacts?: No Does the patient have difficulty concentrating, remembering, or making decisions?: No Patient able to express need for assistance with ADLs?: Yes Does the patient have difficulty dressing or bathing?: No Independently performs ADLs?: Yes (appropriate for developmental age) Does the patient have difficulty walking or climbing stairs?: Yes Weakness of Legs: Both Weakness of Arms/Hands: Both  Permission Sought/Granted                   Emotional Assessment              Admission diagnosis:  Hx of arteriovenostomy for renal dialysis ([place]) [Z99.2] Symptomatic anemia [D64.9] Fever, unspecified fever cause [R50.9] Patient Active Problem List   Diagnosis Date Noted  . Hypokalemia 09/02/2019  . Fever of unknown origin 09/02/2019  . ESRD on dialysis ([place]) 08/20/2019  . Benign essential HTN 08/20/2019  . Symptomatic anemia 08/20/2019  . Thrombocytopenia ([place]) 08/20/2019  . CKD (chronic kidney disease) 05/23/2019  . Type II diabetes mellitus, uncontrolled ([place]) 12/07/2017  . History of anemia due to chronic kidney disease 10/11/2017  . Vitamin B 12 deficiency 06/09/2017  . Great toe amputation status, right 09/04/2016  . Osteomyelitis of toe of right foot ([place])   . Acute renal failure (ARF) ([place]) 05/10/2016  . SBO (small bowel obstruction) ([place]) 05/08/2016  . Diverticular disease of jejunum s/p resection 05/09/2016 05/08/2016  . Normocytic anemia 05/05/2016   PCP:  [name], MD Pharmacy:   [facility] [address] - [place], [place] [address] Phone: [phone] Fax: [phone]  [facility] [phone] - [place], [place] - [address] [place] [address] Phone: [phone] Fax: [phone]     Social Determinants  of Health (SDOH) Interventions    Readmission Risk Interventions No flowsheet data found.

## 2019-09-04 NOTE — Progress Notes (Signed)
  Echocardiogram 2D Echocardiogram has been performed.  Johny Chess 09/04/2019, 11:44 AM

## 2019-09-04 NOTE — Progress Notes (Signed)
   09/04/19 0425  Hand-Off documentation  Handoff Given Given to shift RN/LPN  Report given to (Full Name) judith  Handoff Received Received from shift RN/LPN  Report received from (Full Name) Sharif Rendell  Vital Signs  Temp 99 F (37.2 C)  Temp Source Oral  Pulse Rate 64  Pulse Rate Source Monitor  Resp (!) 25  BP (!) 147/62  BP Location Right Arm  BP Method Automatic  Patient Position (if appropriate) Lying  Oxygen Therapy  SpO2 100 %  O2 Device Nasal Cannula  O2 Flow Rate (L/min) 4 L/min  Post-Hemodialysis Assessment  Rinseback Volume (mL) 250 mL  KECN 287 V  Dialyzer Clearance Lightly streaked  Duration of HD Treatment -hour(s) 3 hour(s)  Hemodialysis Intake (mL) 500 mL  UF Total -Machine (mL) 3531 mL  Net UF (mL) 3031 mL  Tolerated HD Treatment Yes  Post-Hemodialysis Comments tx achieved as expected, pt stable  AVG/AVF Arterial Site Held (minutes) 0 minutes  AVG/AVF Venous Site Held (minutes) 0 minutes  Education / Care Plan  Dialysis Education Provided Yes  Hemodialysis Catheter Right Subclavian  Placement Date/Time: 08/16/19 1200   Placed prior to admission: No  Time Out: Correct patient;Correct site;Correct procedure  Maximum sterile barrier precautions: Hand hygiene;Cap;Mask;Sterile gown;Sterile gloves;Large sterile sheet  Person Inserting ...  Site Condition No complications  Blue Lumen Status Heparin locked;Capped (Central line)  Red Lumen Status Heparin locked;Capped (Central line)  Catheter fill solution Heparin 1000 units/ml  Catheter fill volume (Arterial) 1.6 cc  Catheter fill volume (Venous) 1.6  Dressing Type Gauze/Drain sponge  Dressing Status Old drainage  Drainage Description None

## 2019-09-05 DIAGNOSIS — E440 Moderate protein-calorie malnutrition: Secondary | ICD-10-CM | POA: Insufficient documentation

## 2019-09-05 LAB — CULTURE, BLOOD (ROUTINE X 2)

## 2019-09-05 LAB — RENAL FUNCTION PANEL
Albumin: 1.9 g/dL — ABNORMAL LOW (ref 3.5–5.0)
Anion gap: 10 (ref 5–15)
BUN: 29 mg/dL — ABNORMAL HIGH (ref 8–23)
CO2: 30 mmol/L (ref 22–32)
Calcium: 7.5 mg/dL — ABNORMAL LOW (ref 8.9–10.3)
Chloride: 99 mmol/L (ref 98–111)
Creatinine, Ser: 3.59 mg/dL — ABNORMAL HIGH (ref 0.44–1.00)
GFR calc Af Amer: 14 mL/min — ABNORMAL LOW (ref >60)
GFR calc non Af Amer: 12 mL/min — ABNORMAL LOW (ref >60)
Glucose, Bld: 116 mg/dL — ABNORMAL HIGH (ref 70–99)
Phosphorus: 2.2 mg/dL — ABNORMAL LOW (ref 2.5–4.6)
Potassium: 3.3 mmol/L — ABNORMAL LOW (ref 3.5–5.1)
Sodium: 139 mmol/L (ref 135–145)

## 2019-09-05 LAB — CBC WITH DIFFERENTIAL/PLATELET
Abs Immature Granulocytes: 0.03 10*3/uL (ref 0.00–0.07)
Basophils Absolute: 0 10*3/uL (ref 0.0–0.1)
Basophils Relative: 1 %
Eosinophils Absolute: 0.2 10*3/uL (ref 0.0–0.5)
Eosinophils Relative: 3 %
HCT: 23.3 % — ABNORMAL LOW (ref 36.0–46.0)
Hemoglobin: 7.3 g/dL — ABNORMAL LOW (ref 12.0–15.0)
Immature Granulocytes: 1 %
Lymphocytes Relative: 20 %
Lymphs Abs: 1.2 10*3/uL (ref 0.7–4.0)
MCH: 29.8 pg (ref 26.0–34.0)
MCHC: 31.3 g/dL (ref 30.0–36.0)
MCV: 95.1 fL (ref 80.0–100.0)
Monocytes Absolute: 0.4 10*3/uL (ref 0.1–1.0)
Monocytes Relative: 6 %
Neutro Abs: 4.2 10*3/uL (ref 1.7–7.7)
Neutrophils Relative %: 69 %
Platelets: 91 10*3/uL — ABNORMAL LOW (ref 150–400)
RBC: 2.45 MIL/uL — ABNORMAL LOW (ref 3.87–5.11)
RDW: 13.8 % (ref 11.5–15.5)
WBC: 6 10*3/uL (ref 4.0–10.5)
nRBC: 0 % (ref 0.0–0.2)

## 2019-09-05 LAB — BASIC METABOLIC PANEL
Anion gap: 10 (ref 5–15)
BUN: 25 mg/dL — ABNORMAL HIGH (ref 8–23)
CO2: 28 mmol/L (ref 22–32)
Calcium: 7.5 mg/dL — ABNORMAL LOW (ref 8.9–10.3)
Chloride: 100 mmol/L (ref 98–111)
Creatinine, Ser: 3.18 mg/dL — ABNORMAL HIGH (ref 0.44–1.00)
GFR calc Af Amer: 16 mL/min — ABNORMAL LOW (ref >60)
GFR calc non Af Amer: 14 mL/min — ABNORMAL LOW (ref >60)
Glucose, Bld: 102 mg/dL — ABNORMAL HIGH (ref 70–99)
Potassium: 2.8 mmol/L — ABNORMAL LOW (ref 3.5–5.1)
Sodium: 138 mmol/L (ref 135–145)

## 2019-09-05 LAB — PROCALCITONIN: Procalcitonin: 49.8 ng/mL

## 2019-09-05 MED ORDER — RENA-VITE PO TABS
1.0000 | ORAL_TABLET | Freq: Every day | ORAL | Status: DC
  Administered 2019-09-05 – 2019-09-07 (×3): 1 via ORAL
  Filled 2019-09-05 (×3): qty 1

## 2019-09-05 MED ORDER — ENSURE ENLIVE PO LIQD
237.0000 mL | Freq: Three times a day (TID) | ORAL | Status: DC
  Administered 2019-09-05 – 2019-09-08 (×6): 237 mL via ORAL

## 2019-09-05 MED ORDER — POTASSIUM CHLORIDE CRYS ER 20 MEQ PO TBCR
40.0000 meq | EXTENDED_RELEASE_TABLET | Freq: Once | ORAL | Status: AC
  Administered 2019-09-05: 40 meq via ORAL
  Filled 2019-09-05: qty 2

## 2019-09-05 MED ORDER — DARBEPOETIN ALFA 100 MCG/0.5ML IJ SOSY
PREFILLED_SYRINGE | INTRAMUSCULAR | Status: AC
  Filled 2019-09-05: qty 0.5

## 2019-09-05 MED ORDER — DOXERCALCIFEROL 4 MCG/2ML IV SOLN
INTRAVENOUS | Status: AC
  Filled 2019-09-05: qty 2

## 2019-09-05 NOTE — Progress Notes (Signed)
PROGRESS NOTE  Bailey [name] TKP:[id number] DOB: 28-May-1950 DOA: 09/02/2019 PCP: [name], MD  HPI/Recap of past 24 hours: HPI: Bailey [name] is a 69 y.o. female with medical history significant of ESRD recently started on dialysis (TTS), diet-controlled type 2 diabetes mellitus, anemia of chronic disease, history of R foot osteomyelitis, B12 deficiency, essential hypertension came from dialysis with complaint of generalized weakness for a few days.  She was recently admitted on 6/27 with bleeding from dialysis site and received 2 unit PRBC during that admission and discharged home in stable condition on same day.  She presented to ED on 7/6 with recurrent bleeding from dialysis catheter after dialysis which was controlled with direct pressure and pressure dressing along with thrombin pad and discharged home in stable condition.  ED Course: Upon arrival to ED patient had fever of 101.8, no leukocytosis, hemoglobin 5.8.  EDP ordered 2 unit PRBCs.  [facility] hospitalist asked to admit for symptomatic anemia and fever of unknown etiology.  Was started on IV vancomycin and cefepime.   In acute respiratory distress that night after starting blood transfusion with suspicion for TACO.  Did not complete second unit of PRBC.  Received 1 dose of IV Lasix 80 mg x 1.  Blood cultures 2/2 bottles positive for MSSA.  Started on Ancef.  Autoconsult made for infectious disease.  2D echo done on 09/04/2019 showed no evidence of valvular vegetation.  Repeat blood cultures peripherally x2 for 09/04/2018, no growth to date, continue to follow cultures.  09/05/19: Seen and examined.  No acute events overnight.  She denies any chest pain, dyspnea, nausea or abdominal pain.  She has a poor appetite and overall feels weak.  HD planned today.   Assessment/Plan: Principal Problem:   Symptomatic anemia Active Problems:   ESRD on dialysis (HCC)   Benign essential HTN   Thrombocytopenia (HCC)   Hypokalemia   Fever  of unknown origin   Malnutrition of moderate degree  Generalized weakness, persistent, likely multifactorial secondary to MSSA bacteremia, symptomatic anemia, severe protein calorie malnutrition Presented with hemoglobin of 5.8, 2 unit PRBCs ordered to be transfused, did not complete second unit PRBC transfusion, she developed suspected TACO and transfusion was stopped.  She received 1 dose of IV Lasix 80 mg x 1.  Was placed on oxygen supplementation, currently on 3 L nasal cannula with O2 saturation of 100%.  Blood cultures x2+ for MSSA, on Ancef, managed by ID Repeat blood cultures x2 peripherally on 09/04/2019, negative to date PT to assess Encourage oral intake Continue fall precaution  Sepsis secondary to MSSA bacteremia Presented with a fever with T-max of 101.8, tachypnea with respiration rate of 29 in the setting of MSSA bacteremia 2 out of 2 bottles. MRSA screen negative on 09/03/2019. Could be catheter related, TDC line removed on 09/04/2019 by IR. Blood cultures drawn on 09/02/2019 + for MSSA at 2 out of 2 bottles. 2D echo 7/12 no reported valvular vegetation Repeat blood cultures peripherally x2 on 09/04/2019 negative to date Continue to follow cultures Continue Ancef Procalcitonin downtrending from 52> 49  Infectious disease following Continue to monitor WBC and fever curve  Suspected transfusion associated circulatory overload, TACO Post blood transfusion the evening of admission Received 1 dose of IV Lasix 80 mg x 1 Currently on 3 L nasal cannula Continue to maintain O2 saturation greater than 92% Continue to closely monitor respiratory status  Pulmonary edema in the setting of suspected TACO  independently viewed chest x-ray done early  this morning which showed increasing pulmonary vascularity suggestive of pulmonary edema Received diuretics Volume status addressed with hemodialysis Continue to closely monitor and maintain O2 saturation greater than 92%  Acute hypoxic  respiratory failure secondary to pulmonary edema Not on oxygen supplementation at baseline Currently requiring 3 L oxygen nasal cannula Management as stated above  ESRD, recently diagnosed, on dialysis TTS -Euvolemic on exam -Appreciate nephrology consulting services.  Hypokalemia\ Serum K+ 3.0> 2.8 1 dose of p.o. KCl 40 mEq given Will have HD today Management per nephrology  Essential hypertension On Coreg 3.125 mg twice daily Continue to hold off nifedipine Continue to monitor vital signs  Chronic anxiety/depression Continue home escitalopram  Thrombocytopenia, chronic Platelet count 91K from 80K from 104K Continue to monitor  Diet-controlled type 2 diabetes mellitus:  Last A1c 5.5% Avoid hypoglycemia in the setting of ESRD on HD  DVT prophylaxis: SCD Code Status: Full code-confirmed with patient's husband Family Communication: Plan of care discussed with patient who verbalized understanding and agreed with it.   Consults called: Nephrology, infectious disease   Status is: Inpatient   Dispo: The patient is from: Home.              Anticipated d/c is to: Home.              Anticipated d/c date is: 09/07/2019               Patient currently not stable for discharge due to ongoing work-up and treatment for MSSA bacteremia         Objective: Vitals:   09/05/19 1315 09/05/19 1330 09/05/19 1400 09/05/19 1430  BP: (!) 162/72 133/60 131/62 (!) 146/64  Pulse: 64 63 61 63  Resp: (!) 24 (!) 24 (!) 24 (!) 26  Temp:      TempSrc:      SpO2:      Weight:      Height:        Intake/Output Summary (Last 24 hours) at 09/05/2019 1435 Last data filed at 09/05/2019 4008 Gross per 24 hour  Intake 340.03 ml  Output 350 ml  Net -9.97 ml   Filed Weights   09/04/19 0620 09/05/19 0352 09/05/19 1300  Weight: 52.5 kg 53.3 kg 54.9 kg    Exam:  . General: 69 y.o. year-old female chronically ill-appearing no acute stress.  Alert oriented x3. . Cardiovascular:  Regular rate and rhythm no rubs or gallops. Marland Kitchen Respiratory: Mild rales at bases no wheezing noted.   . Abdomen: Soft nontender normal bowel sounds present. . Musculoskeletal: No lower extremity edema bilaterally.   Marland Kitchen Psychiatry: Mood is appropriate for condition and setting.  Data Reviewed: CBC: Recent Labs  Lab 08/29/19 1714 09/02/19 1614 09/03/19 0340 09/04/19 0853 09/05/19 0348  WBC 5.8 7.9 7.2 6.8 6.0  NEUTROABS  --  7.0  --  5.6 4.2  HGB 7.7* 5.8* 8.5* 8.3* 7.3*  HCT 25.2* 19.1* 26.8* 26.6* 23.3*  MCV 96.9 95.5 94.7 94.0 95.1  PLT 104* PLATELET CLUMPS NOTED ON SMEAR, COUNT APPEARS DECREASED 80* PLATELET CLUMPS NOTED ON SMEAR, COUNT APPEARS DECREASED 91*   Basic Metabolic Panel: Recent Labs  Lab 09/02/19 1614 09/02/19 1811 09/03/19 0340 09/04/19 0853 09/05/19 0348 09/05/19 1157  NA 138  --  138 139 138 139  K 3.0*  --  3.7 3.0* 2.8* 3.3*  CL 98  --  97* 101 100 99  CO2 31  --  $R'28 27 28 30  'IT$ GLUCOSE 134*  --  192*  115* 102* 116*  BUN 6*  --  15 11 25* 29*  CREATININE 1.75*  --  2.66* 1.97* 3.18* 3.59*  CALCIUM 7.9*  --  8.2* 7.9* 7.5* 7.5*  MG  --  1.7  --   --   --   --   PHOS  --  2.5  --   --   --  2.2*   GFR: Estimated Creatinine Clearance: 12.1 mL/min (A) (by C-G formula based on SCr of 3.59 mg/dL (H)). Liver Function Tests: Recent Labs  Lab 09/02/19 1614 09/03/19 0340 09/05/19 1157  AST 22 24  --   ALT 17 21  --   ALKPHOS 72 73  --   BILITOT 0.5 0.9  --   PROT 5.6* 5.6*  --   ALBUMIN 2.5* 2.5* 1.9*   No results for input(s): LIPASE, AMYLASE in the last 168 hours. No results for input(s): AMMONIA in the last 168 hours. Coagulation Profile: Recent Labs  Lab 08/29/19 1958 09/02/19 1614 09/03/19 0340  INR 1.2 1.3* 1.5*   Cardiac Enzymes: No results for input(s): CKTOTAL, CKMB, CKMBINDEX, TROPONINI in the last 168 hours. BNP (last 3 results) No results for input(s): PROBNP in the last 8760 hours. HbA1C: No results for input(s): HGBA1C in the  last 72 hours. CBG: Recent Labs  Lab 09/02/19 2130 09/03/19 0820 09/03/19 1158 09/03/19 1609 09/03/19 2034  GLUCAP 141* 131* 150* 158* 188*   Lipid Profile: No results for input(s): CHOL, HDL, LDLCALC, TRIG, CHOLHDL, LDLDIRECT in the last 72 hours. Thyroid Function Tests: Recent Labs    09/02/19 1811  TSH 3.050   Anemia Panel: Recent Labs    09/04/19 1229  VITAMINB12 755   Urine analysis:    Component Value Date/Time   COLORURINE YELLOW 09/03/2019 0558   APPEARANCEUR CLEAR 09/03/2019 0558   LABSPEC 1.014 09/03/2019 0558   PHURINE 9.0 (H) 09/03/2019 0558   GLUCOSEU 150 (A) 09/03/2019 0558   HGBUR SMALL (A) 09/03/2019 0558   BILIRUBINUR NEGATIVE 09/03/2019 0558   BILIRUBINUR small 02/10/2014 1127   KETONESUR NEGATIVE 09/03/2019 0558   PROTEINUR >=300 (A) 09/03/2019 0558   UROBILINOGEN 1.0 02/10/2014 2233   NITRITE NEGATIVE 09/03/2019 0558   LEUKOCYTESUR NEGATIVE 09/03/2019 0558   Sepsis Labs: $RemoveBefo'@[email or web address]'[id number]$ (procalcitonin:4,lacticidven:4)  ) Recent Results (from the past 240 hour(s))  Culture, blood (Routine x 2)     Status: Abnormal (Preliminary result)   Collection Time: 09/02/19  4:10 PM   Specimen: BLOOD  Result Value Ref Range Status   Specimen Description BLOOD BLOOD LEFT FOREARM  Final   Special Requests   Final    BOTTLES DRAWN AEROBIC AND ANAEROBIC Blood Culture adequate volume   Culture  Setup Time   Final    GRAM POSITIVE COCCI IN CLUSTERS IN BOTH AEROBIC AND ANAEROBIC BOTTLES CRITICAL VALUE NOTED.  VALUE IS CONSISTENT WITH PREVIOUSLY REPORTED AND CALLED VALUE. Performed at [facility], [place] [address] [place], [place] [address]    Culture STAPHYLOCOCCUS AUREUS (A)  Final   Report Status PENDING  Incomplete  Culture, blood (Routine x 2)     Status: Abnormal   Collection Time: 09/02/19  4:14 PM   Specimen: BLOOD  Result Value Ref Range Status   Specimen Description BLOOD SITE NOT SPECIFIED  Final   Special Requests   Final    BOTTLES DRAWN  AEROBIC AND ANAEROBIC Blood Culture results may not be optimal due to an excessive volume of blood received in culture bottles   Culture  Setup Time   Final    GRAM POSITIVE COCCI IN CLUSTERS IN BOTH AEROBIC AND ANAEROBIC BOTTLES CRITICAL RESULT CALLED TO, READ BACK BY AND VERIFIED WITH: PHARMD K. PIERCE [id number] FCP Performed at [facility], [place] [address] [place], [place] [address]    Culture STAPHYLOCOCCUS AUREUS (A)  Final   Report Status 09/05/2019 FINAL  Final   Organism ID, Bacteria STAPHYLOCOCCUS AUREUS  Final      Susceptibility   Staphylococcus aureus - MIC*    CIPROFLOXACIN <=0.5 SENSITIVE Sensitive     ERYTHROMYCIN RESISTANT Resistant     GENTAMICIN <=0.5 SENSITIVE Sensitive     OXACILLIN <=0.25 SENSITIVE Sensitive     TETRACYCLINE <=1 SENSITIVE Sensitive     VANCOMYCIN <=0.5 SENSITIVE Sensitive     TRIMETH/SULFA <=10 SENSITIVE Sensitive     CLINDAMYCIN RESISTANT Resistant     RIFAMPIN <=0.5 SENSITIVE Sensitive     Inducible Clindamycin POSITIVE Resistant     * STAPHYLOCOCCUS AUREUS  Blood Culture ID Panel (Reflexed)     Status: Abnormal   Collection Time: 09/02/19  4:14 PM  Result Value Ref Range Status   Enterococcus species NOT DETECTED NOT DETECTED Final   Listeria monocytogenes NOT DETECTED NOT DETECTED Final   Staphylococcus species DETECTED (A) NOT DETECTED Final    Comment: CRITICAL RESULT CALLED TO, READ BACK BY AND VERIFIED WITH: PHARMD K. PIERCE [phone] FCP    Staphylococcus aureus (BCID) DETECTED (A) NOT DETECTED Final    Comment: Methicillin (oxacillin) susceptible Staphylococcus aureus (MSSA). Preferred therapy is anti staphylococcal beta lactam antibiotic (Cefazolin or Nafcillin), unless clinically contraindicated. CRITICAL RESULT CALLED TO, READ BACK BY AND VERIFIED WITH: PHARMD K. PIERCE [phone] FCP    Methicillin resistance NOT DETECTED NOT DETECTED Final   Streptococcus species NOT DETECTED NOT DETECTED Final   Streptococcus  agalactiae NOT DETECTED NOT DETECTED Final   Streptococcus pneumoniae NOT DETECTED NOT DETECTED Final   Streptococcus pyogenes NOT DETECTED NOT DETECTED Final   Acinetobacter baumannii NOT DETECTED NOT DETECTED Final   Enterobacteriaceae species NOT DETECTED NOT DETECTED Final   Enterobacter cloacae complex NOT DETECTED NOT DETECTED Final   Escherichia coli NOT DETECTED NOT DETECTED Final   Klebsiella oxytoca NOT DETECTED NOT DETECTED Final   Klebsiella pneumoniae NOT DETECTED NOT DETECTED Final   Proteus species NOT DETECTED NOT DETECTED Final   Serratia marcescens NOT DETECTED NOT DETECTED Final   Haemophilus influenzae NOT DETECTED NOT DETECTED Final   Neisseria meningitidis NOT DETECTED NOT DETECTED Final   Pseudomonas aeruginosa NOT DETECTED NOT DETECTED Final   Candida albicans NOT DETECTED NOT DETECTED Final   Candida glabrata NOT DETECTED NOT DETECTED Final   Candida krusei NOT DETECTED NOT DETECTED Final   Candida parapsilosis NOT DETECTED NOT DETECTED Final   Candida tropicalis NOT DETECTED NOT DETECTED Final    Comment: Performed at [facility], [place] [address] [place], [place] [address]  SARS Coronavirus 2 by RT PCR (hospital order, performed in [facility] hospital lab) Nasopharyngeal Nasopharyngeal Swab     Status: None   Collection Time: 09/02/19  5:19 PM   Specimen: Nasopharyngeal Swab  Result Value Ref Range Status   SARS Coronavirus 2 NEGATIVE NEGATIVE Final    Comment: (NOTE) SARS-CoV-2 target nucleic acids are NOT DETECTED.  The SARS-CoV-2 RNA is generally detectable in upper and lower respiratory specimens during the acute phase of infection. The lowest concentration of SARS-CoV-2 viral copies this assay can detect is 250 copies /  mL. A negative result does not preclude SARS-CoV-2 infection and should not be used as the sole basis for treatment or other patient management decisions.  A negative result may occur with improper specimen collection /  handling, submission of specimen other than nasopharyngeal swab, presence of viral mutation(s) within the areas targeted by this assay, and inadequate number of viral copies (<250 copies / mL). A negative result must be combined with clinical observations, patient history, and epidemiological information.  Fact Sheet for Patients:   StrictlyIdeas.no  Fact Sheet for Healthcare Providers: BankingDealers.co.za  This test is not yet approved or  cleared by the [place] FDA and has been authorized for detection and/or diagnosis of SARS-CoV-2 by FDA under an Emergency Use Authorization (EUA).  This EUA will remain in effect (meaning this test can be used) for the duration of the COVID-19 declaration under Section 564(b)(1) of the Act, 21 U.S.C. section 360bbb-3(b)(1), unless the authorization is terminated or revoked sooner.  Performed at [facility], [place] [address] [place], [place] [address]   Urine culture     Status: Abnormal   Collection Time: 09/03/19  5:58 AM   Specimen: Urine, Random  Result Value Ref Range Status   Specimen Description URINE, RANDOM  Final   Special Requests NONE  Final   Culture (A)  Final    <10,000 COLONIES/mL INSIGNIFICANT GROWTH Performed at [facility], [place] [address], [place], [place] [address]    Report Status 09/04/2019 FINAL  Final  MRSA PCR Screening     Status: None   Collection Time: 09/03/19  5:58 AM   Specimen: Nasopharyngeal  Result Value Ref Range Status   MRSA by PCR NEGATIVE NEGATIVE Final    Comment:        The GeneXpert MRSA Assay (FDA approved for NASAL specimens only), is one component of a comprehensive MRSA colonization surveillance program. It is not intended to diagnose MRSA infection nor to guide or monitor treatment for MRSA infections. Performed at [facility], [place] [address] [place], [place] [address]   Culture, blood (Routine X 2) w Reflex to  ID Panel     Status: None (Preliminary result)   Collection Time: 09/04/19  8:53 AM   Specimen: BLOOD RIGHT HAND  Result Value Ref Range Status   Specimen Description BLOOD RIGHT HAND  Final   Special Requests   Final    BOTTLES DRAWN AEROBIC ONLY Blood Culture adequate volume   Culture   Final    NO GROWTH 1 DAY Performed at [facility], [place] [address] [place], [place] [address]    Report Status PENDING  Incomplete  Culture, blood (Routine X 2) w Reflex to ID Panel     Status: None (Preliminary result)   Collection Time: 09/04/19  8:53 AM   Specimen: BLOOD RIGHT HAND  Result Value Ref Range Status   Specimen Description BLOOD RIGHT HAND  Final   Special Requests   Final    BOTTLES DRAWN AEROBIC ONLY Blood Culture results may not be optimal due to an inadequate volume of blood received in culture bottles   Culture   Final    NO GROWTH 1 DAY Performed at [facility], [place] [address] [place],  [address]    Report Status PENDING  Incomplete      Studies: IR Removal Tun Cv Cath W/O FL  Result Date: 09/04/2019 INDICATION: Patient with history of ESRD on HD s/p tunneled right  IJ HD catheter placement by [facility] vascular. Patient now with functioning AVF. Request is made for removal of tunneled HD catheter. EXAM: REMOVAL OF TUNNELED HEMODIALYSIS CATHETER MEDICATIONS: None COMPLICATIONS: None immediate. PROCEDURE: Informed written consent was obtained from the patient following an explanation of the procedure, risks, benefits and alternatives to treatment. A time out was performed prior to the initiation of the procedure. Maximal barrier sterile technique was utilized including mask, sterile gowns, sterile gloves, large sterile drape, hand hygiene, and Hibiclens. Approximately 5 sutures were removed from site. Utilizing gentle traction, the catheter was removed intact. Hemostasis was obtained with manual compression. A dressing was placed. The patient tolerated the procedure  well without immediate post procedural complication. IMPRESSION: Successful removal of tunneled dialysis catheter. Read by: Earley Abide, PA-C Electronically Signed   By: Sandi Mariscal M.D.   On: 09/04/2019 16:03    Scheduled Meds: . carvedilol  3.125 mg Oral BID WC  . Chlorhexidine Gluconate Cloth  6 each Topical Daily  . Chlorhexidine Gluconate Cloth  6 each Topical Q0600  . cyanocobalamin  1,000 mcg Intramuscular Q30 days  . Darbepoetin Alfa      . darbepoetin (ARANESP) injection - DIALYSIS  100 mcg Intravenous Q Tue-HD  . doxercalciferol      . doxercalciferol  2 mcg Intravenous Q T,Th,Sa-HD  . feeding supplement (ENSURE ENLIVE)  237 mL Oral TID BM  . multivitamin  1 tablet Oral QHS    Continuous Infusions: .  ceFAZolin (ANCEF) IV Stopped (09/04/19 1342)     LOS: 3 days     Kayleen Memos, MD [facility] Pager [phone]  If 7PM-7AM, please contact night-coverage [email or web address] Password [facility] 09/05/2019, 2:35 PM

## 2019-09-05 NOTE — Progress Notes (Signed)
Initial Nutrition Assessment  DOCUMENTATION CODES:   Non-severe (moderate) malnutrition in context of chronic illness  INTERVENTION:  Provided renal diet education  Ensure Enlive po TID, each supplement provides 350 kcal and 20 grams of protein  Rena-vit daily  NUTRITION DIAGNOSIS:   Moderate Malnutrition related to chronic illness (DM, ESRD) as evidenced by energy intake < or equal to 75% for > or equal to 1 month, moderate fat depletion, moderate muscle depletion.    GOAL:   Patient will meet greater than or equal to 90% of their needs    MONITOR:   PO intake, Supplement acceptance, Labs, Weight trends, I & O's  REASON FOR ASSESSMENT:   Consult Assessment of nutrition requirement/status, Diet education  ASSESSMENT:   Pt presented with generalized weakness, likely multifactorial 2/2 MSSA bacteremia, symptomatic anemia. PMH includes ESRD on HD (started 08/21/19), T2DM, anemia of chronic disease, hx of osteomyelitis, HTN.  Yesterday, pt R IJ tunneled catheter removed.  Pt reports poor appetite and wt loss over the last year. States her intake is the same on HD and non-HD days. For breakfast, she often has a bowl of cereal and will occasionally have eggs. For lunch, she typically has a deli meat sandwich. Dinner varies, but is often some type of balanced meal.   Wt hx reviewed which does show gradual wt loss over the last year. Pt states this wt loss (and previously mentioned poor appetite) were due to poorly controlled diabetes mellitus.   Discussed renal diet with pt and provided handouts. Emphasized importance of adequate protein intake and encouraged pt to consume oral nutrition supplements to help meet calorie/protein needs. Pt agreeable.   EDW 56 kg Current wt 53.3 kg  Pt to receive HD today.   PO Intake: 50% x1 recorded meal  Labs: K+2.8 (L) Medications: Hectorol, Vitamin B12, Aranesp  NUTRITION - FOCUSED PHYSICAL EXAM:   Most Recent Value  Orbital Region  Mild depletion  Upper Arm Region Moderate depletion  Thoracic and Lumbar Region Moderate depletion  Buccal Region Moderate depletion  Temple Region Moderate depletion  Clavicle Bone Region Moderate depletion  Clavicle and Acromion Bone Region Moderate depletion  Scapular Bone Region Moderate depletion  Dorsal Hand Moderate depletion  Patellar Region Moderate depletion  Anterior Thigh Region Moderate depletion  Posterior Calf Region Moderate depletion  Edema (RD Assessment) Mild  Hair Reviewed  Eyes Reviewed  Mouth Reviewed  Skin Reviewed  Nails Reviewed       Diet Order:   Diet Order            Diet renal/carb modified with fluid restriction Diet-HS Snack? Nothing; Fluid restriction: 1200 mL Fluid; Room service appropriate? No; Fluid consistency: Thin  Diet effective now                 EDUCATION NEEDS:   Education needs have been addressed  Skin:  Skin Assessment: Reviewed RN Assessment  Last BM:  7/12  Height:   Ht Readings from Last 1 Encounters:  09/02/19 $RemoveB'5\' 3"'VJOYphfT$  (1.6 m)    Weight:   Wt Readings from Last 1 Encounters:  09/05/19 53.3 kg    BMI:  Body mass index is 20.8 kg/m.  Estimated Nutritional Needs:   Kcal:  1500-1700  Protein:  75-90 grams  Fluid:  1023ml + UOP    Larkin Ina, MS, RD, LDN RD pager number and weekend/on-call pager number located in [place].

## 2019-09-05 NOTE — Progress Notes (Addendum)
Pt had 2 hours left of tx when she forgot she had needles and bent arm which infiltrated venous needle. Unable to recannulate. Notified NP, Owens Shark. Ice placed on infiltration and educated primary RN to keep ice on to help with swelling

## 2019-09-05 NOTE — Progress Notes (Signed)
[facility] Progress Note   Subjective: C/O of being tired. Oriented to person and place, confused about details of events. HD today on schedule. Using AVF for first time. Expert cannulator.   Objective Vitals:   09/05/19 0010 09/05/19 0352 09/05/19 0353 09/05/19 0807  BP: 125/66 (!) 152/69  (!) 159/62  Pulse: 66 61  (!) 59  Resp: (!) 22 18    Temp: 98.5 F (36.9 C) 98.4 F (36.9 C)  97.8 F (36.6 C)  TempSrc: Oral Oral  Oral  SpO2: 99%  99% 100%  Weight:  53.3 kg    Height:       Physical Exam General: pale, chronically ill appearing pt in NAD Heart: S1,S2, RRR SR on monitor, rate 60s.  Lungs: Decreased in bases with bibasilar crackles. No WOB.   Abdomen: S, NT Active BS Extremities: Trace BLE edema Dialysis Access:  L AVF with T/B-bruit shrill mid AVF. Looks ready to use.    Additional Objective Labs: Basic Metabolic Panel: Recent Labs  Lab 09/02/19 1614 09/02/19 1811 09/03/19 0340 09/04/19 0853 09/05/19 0348  NA   < >  --  138 139 138  K   < >  --  3.7 3.0* 2.8*  CL   < >  --  97* 101 100  CO2   < >  --  $R'28 27 28  'ut$ GLUCOSE   < >  --  192* 115* 102*  BUN   < >  --  15 11 25*  CREATININE   < >  --  2.66* 1.97* 3.18*  CALCIUM   < >  --  8.2* 7.9* 7.5*  PHOS  --  2.5  --   --   --    < > = values in this interval not displayed.   Liver Function Tests: Recent Labs  Lab 09/02/19 1614 09/03/19 0340  AST 22 24  ALT 17 21  ALKPHOS 72 73  BILITOT 0.5 0.9  PROT 5.6* 5.6*  ALBUMIN 2.5* 2.5*   No results for input(s): LIPASE, AMYLASE in the last 168 hours. CBC: Recent Labs  Lab 08/29/19 1714 08/29/19 1714 09/02/19 1614 09/02/19 1614 09/03/19 0340 09/04/19 0853 09/05/19 0348  WBC 5.8   < > 7.9   < > 7.2 6.8 6.0  NEUTROABS  --   --  7.0  --   --  5.6 4.2  HGB 7.7*   < > 5.8*   < > 8.5* 8.3* 7.3*  HCT 25.2*   < > 19.1*   < > 26.8* 26.6* 23.3*  MCV 96.9  --  95.5  --  94.7 94.0 95.1  PLT 104*   < > PLATELET CLUMPS NOTED ON SMEAR, COUNT  APPEARS DECREASED   < > 80* PLATELET CLUMPS NOTED ON SMEAR, COUNT APPEARS DECREASED 91*   < > = values in this interval not displayed.   Blood Culture    Component Value Date/Time   SDES URINE, RANDOM 09/03/2019 0558   SPECREQUEST NONE 09/03/2019 0558   CULT (A) 09/03/2019 0558    <10,000 COLONIES/mL INSIGNIFICANT GROWTH Performed at [facility] [address], [place], [place] [address]    REPTSTATUS 09/04/2019 FINAL 09/03/2019 0558    Cardiac Enzymes: No results for input(s): CKTOTAL, CKMB, CKMBINDEX, TROPONINI in the last 168 hours. CBG: Recent Labs  Lab 09/02/19 2130 09/03/19 0820 09/03/19 1158 09/03/19 1609 09/03/19 2034  GLUCAP 141* 131* 150* 158* 188*   Iron Studies: No results for input(s): IRON,  TIBC, TRANSFERRIN, FERRITIN in the last 72 hours. $RemoveB'@lablastinr3'ODDHKwMp$ @ Studies/Results: IR Removal Tun Cv Cath W/O FL  Result Date: 09/04/2019 INDICATION: Patient with history of ESRD on HD s/p tunneled right IJ HD catheter placement by [facility]. Patient now with functioning AVF. Request is made for removal of tunneled HD catheter. EXAM: REMOVAL OF TUNNELED HEMODIALYSIS CATHETER MEDICATIONS: None COMPLICATIONS: None immediate. PROCEDURE: Informed written consent was obtained from the patient following an explanation of the procedure, risks, benefits and alternatives to treatment. A time out was performed prior to the initiation of the procedure. Maximal barrier sterile technique was utilized including mask, sterile gowns, sterile gloves, large sterile drape, hand hygiene, and Hibiclens. Approximately 5 sutures were removed from site. Utilizing gentle traction, the catheter was removed intact. Hemostasis was obtained with manual compression. A dressing was placed. The patient tolerated the procedure well without immediate post procedural complication. IMPRESSION: Successful removal of tunneled dialysis catheter. Read by: Earley Abide, PA-C Electronically Signed   By: Sandi Mariscal  M.D.   On: 09/04/2019 16:03   ECHOCARDIOGRAM COMPLETE  Result Date: 09/04/2019    ECHOCARDIOGRAM REPORT   Patient Name:   DARLYNE SCHMIESING [facility] Date of Exam: 09/04/2019 Medical Rec #:  [id number]      Height:       63.0 in Accession #:    [id number]     Weight:       115.7 lb Date of Birth:  [birth year]      BSA:          1.533 m Patient Age:    69 years       BP:           141/65 mmHg Patient Gender: F              HR:           73 bpm. Exam Location:  Inpatient Procedure: 2D Echo Indications:    bacteremia 790.7  History:        Patient has no prior history of Echocardiogram examinations. End                 stage renal disease, Signs/Symptoms:Bacteremia and Fever; Risk                 Factors:Hypertension and Diabetes.  Sonographer:    Johny Chess Referring Phys: [id number] Jensen  1. Left ventricular ejection fraction, by estimation, is 50 to 55%. The left ventricle has low normal function. The left ventricle has no regional wall motion abnormalities. Left ventricular diastolic parameters are consistent with Grade II diastolic dysfunction (pseudonormalization).  2. Right ventricular systolic function is normal. The right ventricular size is normal.  3. Left atrial size was severely dilated.  4. The mitral valve is normal in structure. Mild mitral valve regurgitation. No evidence of mitral stenosis.  5. The aortic valve is normal in structure. Aortic valve regurgitation is not visualized. Mild to moderate aortic valve sclerosis/calcification is present, without any evidence of aortic stenosis.  6. The inferior vena cava is dilated in size with <50% respiratory variability, suggesting right atrial pressure of 15 mmHg. FINDINGS  Left Ventricle: Left ventricular ejection fraction, by estimation, is 50 to 55%. The left ventricle has low normal function. The left ventricle has no regional wall motion abnormalities. The left ventricular internal cavity size was normal in size. There is no left  ventricular hypertrophy. Left ventricular diastolic parameters are consistent with Grade II diastolic dysfunction (pseudonormalization). Right Ventricle: The right  ventricular size is normal.Right ventricular systolic function is normal. Left Atrium: Left atrial size was severely dilated. Right Atrium: Right atrial size was normal in size. Pericardium: Trivial pericardial effusion is present. Mitral Valve: The mitral valve is normal in structure. Normal mobility of the mitral valve leaflets. Moderate mitral annular calcification. Mild mitral valve regurgitation. No evidence of mitral valve stenosis. Tricuspid Valve: The tricuspid valve is normal in structure. Tricuspid valve regurgitation is trivial. No evidence of tricuspid stenosis. Aortic Valve: The aortic valve is normal in structure. Aortic valve regurgitation is not visualized. Mild to moderate aortic valve sclerosis/calcification is present, without any evidence of aortic stenosis. Pulmonic Valve: The pulmonic valve was normal in structure. Pulmonic valve regurgitation is trivial. No evidence of pulmonic stenosis. Aorta: The aortic root is normal in size and structure. Venous: The inferior vena cava is dilated in size with less than 50% respiratory variability, suggesting right atrial pressure of 15 mmHg.  LEFT VENTRICLE PLAX 2D LVIDd:         5.20 cm  Diastology LVIDs:         3.40 cm  LV e' lateral:   7.07 cm/s LV PW:         1.00 cm  LV E/e' lateral: 19.7 LV IVS:        0.80 cm  LV e' medial:    7.40 cm/s LVOT diam:     2.30 cm  LV E/e' medial:  18.8 LV SV:         98 LV SV Index:   64 LVOT Area:     4.15 cm  RIGHT VENTRICLE             IVC RV S prime:     13.70 cm/s  IVC diam: 2.80 cm TAPSE (M-mode): 2.2 cm LEFT ATRIUM              Index       RIGHT ATRIUM           Index LA diam:        4.00 cm  2.61 cm/m  RA Area:     14.50 cm LA Vol (A2C):   105.0 ml 68.51 ml/m RA Volume:   33.60 ml  21.92 ml/m LA Vol (A4C):   56.2 ml  36.67 ml/m LA Biplane Vol:  79.7 ml  52.00 ml/m  AORTIC VALVE LVOT Vmax:   122.00 cm/s LVOT Vmean:  79.800 cm/s LVOT VTI:    0.237 m  AORTA Ao Root diam: 3.10 cm Ao Asc diam:  3.30 cm MITRAL VALVE MV Area (PHT): 3.72 cm     SHUNTS MV Decel Time: 204 msec     Systemic VTI:  0.24 m MV E velocity: 139.00 cm/s  Systemic Diam: 2.30 cm MV A velocity: 110.00 cm/s MV E/A ratio:  1.26 Kirk Ruths MD Electronically signed by Kirk Ruths MD Signature Date/Time: 09/04/2019/2:37:34 PM    Final    Medications: .  ceFAZolin (ANCEF) IV Stopped (09/04/19 1342)   . carvedilol  3.125 mg Oral BID WC  . Chlorhexidine Gluconate Cloth  6 each Topical Daily  . Chlorhexidine Gluconate Cloth  6 each Topical Q0600  . cyanocobalamin  1,000 mcg Intramuscular Q30 days  . darbepoetin (ARANESP) injection - DIALYSIS  100 mcg Intravenous Q Tue-HD  . doxercalciferol  2 mcg Intravenous Q T,Th,Sa-HD  . feeding supplement (ENSURE ENLIVE)  237 mL Oral TID BM  . multivitamin  1 tablet Oral QHS     HD orders: TTS -  [facility] 3.5hrs, 400/600  56kg,2K/2.25Ca Access: L AVF Heparinnone Mircera73mcg IV q2wks - last 08/19/19 Venofer $RemoveBeforeD'50mg'JuoLmAFKEAUMDd$  IV qwk Hectorol33mcg IV qHD   Assessment/Plan: 1. MSSA bacteremia- tmax 99.3 last 24hrs. BC +MSSA. On Vanc/Cefepime. Etiology unclear, could be catheter related. Seen by ID. Plan to remove catheter after dialysis. Has L AVF placed 06/08/2019. Should be ready for use. Hopefully can avoid replacing line.  2. Volume overload- CXR with vascular congestion/pulm edema on adm. HD 07/11 Net UF 3 liters. Still with few bibasilar crackles but overt volume overload seems resolved. No WOB. Continue lowering volume as tolerated in HD today.  3. Confusion: Unclear if this is pre-existing issue or related to current sepsis. Per primary.  4. ESRD- New start on HD TTS. HD today on schedule. AVF ready for use on 7/15, may be able to start using a few days early, if able to cannulate successfully will not need to have new  catheter placed. 5. Hypertension-BP controlled post HD. Continue home BP meds.  6. Anemiaof CKD- Hgb initially 5.8 in ED, improved to 8.5 s/p 2units PRBC. Did not get Aranesp with HD last night. Reorder for 07/13. HGB 7.3 today.   7. Secondary Hyperparathyroidism -Ca and phos previously at goal. RFP added to labs today. Continue VDRA. Not on binders. 8. Nutrition- Renal diet w/fluid restrictions. Alb 2.5, protein supplements.  9. DMT2 - per primary  Lesleigh Hughson H. Kailiana Granquist NP-C 09/05/2019, 11:17 AM  [employer] [phone]

## 2019-09-06 LAB — CBC WITH DIFFERENTIAL/PLATELET
Abs Immature Granulocytes: 0.06 10*3/uL (ref 0.00–0.07)
Basophils Absolute: 0 10*3/uL (ref 0.0–0.1)
Basophils Relative: 1 %
Eosinophils Absolute: 0.1 10*3/uL (ref 0.0–0.5)
Eosinophils Relative: 2 %
HCT: 24.5 % — ABNORMAL LOW (ref 36.0–46.0)
Hemoglobin: 7.6 g/dL — ABNORMAL LOW (ref 12.0–15.0)
Immature Granulocytes: 1 %
Lymphocytes Relative: 28 %
Lymphs Abs: 1.5 10*3/uL (ref 0.7–4.0)
MCH: 29.6 pg (ref 26.0–34.0)
MCHC: 31 g/dL (ref 30.0–36.0)
MCV: 95.3 fL (ref 80.0–100.0)
Monocytes Absolute: 0.5 10*3/uL (ref 0.1–1.0)
Monocytes Relative: 9 %
Neutro Abs: 3.2 10*3/uL (ref 1.7–7.7)
Neutrophils Relative %: 59 %
Platelets: 96 10*3/uL — ABNORMAL LOW (ref 150–400)
RBC: 2.57 MIL/uL — ABNORMAL LOW (ref 3.87–5.11)
RDW: 13.9 % (ref 11.5–15.5)
WBC: 5.4 10*3/uL (ref 4.0–10.5)
nRBC: 0 % (ref 0.0–0.2)

## 2019-09-06 LAB — BASIC METABOLIC PANEL
Anion gap: 11 (ref 5–15)
BUN: 30 mg/dL — ABNORMAL HIGH (ref 8–23)
CO2: 26 mmol/L (ref 22–32)
Calcium: 7.5 mg/dL — ABNORMAL LOW (ref 8.9–10.3)
Chloride: 100 mmol/L (ref 98–111)
Creatinine, Ser: 3.5 mg/dL — ABNORMAL HIGH (ref 0.44–1.00)
GFR calc Af Amer: 15 mL/min — ABNORMAL LOW (ref >60)
GFR calc non Af Amer: 13 mL/min — ABNORMAL LOW (ref >60)
Glucose, Bld: 92 mg/dL (ref 70–99)
Potassium: 3.4 mmol/L — ABNORMAL LOW (ref 3.5–5.1)
Sodium: 137 mmol/L (ref 135–145)

## 2019-09-06 LAB — CULTURE, BLOOD (ROUTINE X 2): Special Requests: ADEQUATE

## 2019-09-06 LAB — PROCALCITONIN: Procalcitonin: 35.66 ng/mL

## 2019-09-06 MED ORDER — POTASSIUM CHLORIDE CRYS ER 20 MEQ PO TBCR
20.0000 meq | EXTENDED_RELEASE_TABLET | Freq: Once | ORAL | Status: AC
  Administered 2019-09-06: 20 meq via ORAL
  Filled 2019-09-06: qty 1

## 2019-09-06 NOTE — Evaluation (Signed)
Physical Therapy Evaluation Patient Details Name: Bailey [name] MRN: [id number] DOB: December 30, 1950 Today's Date: 09/06/2019   History of Present Illness  69 y.o. female with medical history significant of ESRD recently started on dialysis (TTS), diet-controlled type 2 diabetes mellitus, anemia of chronic disease, history of osteomyelitis, hypertension came from dialysis with complaint of generalized weakness. In ED found to have fever of 101.8 and was anemic H&H 5.8/18.1 transfused 1 unit PRBC with suspected transfusion associated circulatory overload. Admitted 09/02/19 for treatment of acute on chronic anemia found to be septic due to MSSA bacteremia  Clinical Impression  PTA pt living with husband in single story home with 6 steps to enter. Husband works during the day but pt reports friend could come and stay with her during the day if need be. Pt reports independence in mobility, but "I'm a little unsteady." Pt also reports independence with ADLs/iADLs. Pt is currently limited in safe mobility by decreased strength and balance. Pt is supervision for bed mobility, min guard for transfers and hands on min guard for ambulation without AD. Pt's pastor arrived during Evaluation and pt eager to speak with him so ambulation limited today. PT recommending HHPT to work on strength and balance to improve safety in her home environment. PT will continue to follow acutely.    Follow Up Recommendations Home health PT;Supervision/Assistance - 24 hour    Equipment Recommendations  None recommended by PT       Precautions / Restrictions Precautions Precautions: Fall Restrictions Weight Bearing Restrictions: No      Mobility  Bed Mobility Overal bed mobility: Needs Assistance Bed Mobility: Supine to Sit     Supine to sit: Supervision     General bed mobility comments: supervision for safety, vc for reaching for bedrail to assist in coming to EoB  Transfers Overall transfer level: Needs  assistance Equipment used: None Transfers: Sit to/from Stand Sit to Stand: Min guard         General transfer comment: min guard for safety with power up, pt self steadies with posterior LE support on side of bed, once steadied able to step away from bed and maintain static balance  Ambulation/Gait Ambulation/Gait assistance: Min guard Gait Distance (Feet): 20 Feet Assistive device: None Gait Pattern/deviations: Step-through pattern;Decreased stride length Gait velocity: slowed Gait velocity interpretation: 1.31 - 2.62 ft/sec, indicative of limited community ambulator General Gait Details: hands on min guard for safety, pt reaching for UE support on furniture in the room to steady, pt reports decreased balance since toe amputations        Balance Overall balance assessment: Needs assistance Sitting-balance support: Feet supported;No upper extremity supported;Feet unsupported Sitting balance-Leahy Scale: Good     Standing balance support: No upper extremity supported;During functional activity Standing balance-Leahy Scale: Fair Standing balance comment: requires support initially on standing using side of bed to support LE then able to maintain static stand away from bed but can not tolerate perturbation                              Pertinent Vitals/Pain Pain Assessment: No/denies pain    Home Living Family/patient expects to be discharged to:: Private residence Living Arrangements: Spouse/significant other Available Help at Discharge: Family;Available PRN/intermittently;Friend(s);Available 24 hours/day Type of Home: House Home Access: Stairs to enter Entrance Stairs-Rails: Can reach both Entrance Stairs-Number of Steps: 6 in front, 4 in back Home Layout: One level Home Equipment: Walker - 2 wheels;Cane -  single point      Prior Function Level of Independence: Independent                  Extremity/Trunk Assessment   Upper Extremity  Assessment Upper Extremity Assessment: LUE deficits/detail LUE Deficits / Details: fistula placement 4/21, infiltrated during first dialysis session     Lower Extremity Assessment Lower Extremity Assessment: RLE deficits/detail;LLE deficits/detail RLE Deficits / Details: R great toe amputation, generalized weakness RLE Sensation: history of peripheral neuropathy LLE Deficits / Details: L great toe amputation, generalized weakness  LLE Sensation: history of peripheral neuropathy       Communication   Communication: No difficulties  Cognition Arousal/Alertness: Awake/alert Behavior During Therapy: WFL for tasks assessed/performed Overall Cognitive Status: Impaired/Different from baseline Area of Impairment: Memory;Problem solving                     Memory: Decreased short-term memory       Problem Solving: Requires verbal cues;Requires tactile cues General Comments: able to answer home set up and PLOF, forgets details of current medical condition and defers to husband      General Comments General comments (skin integrity, edema, etc.): Pt on 2L O2 via [place] on entry with SaO2 98%O2, removed and able to maintain SaO2 98%O2 with ambulation SaO2 >94%O2        Assessment/Plan    PT Assessment Patient needs continued PT services  PT Problem List Decreased strength;Decreased activity tolerance;Decreased balance;Decreased mobility;Decreased cognition;Impaired sensation       PT Treatment Interventions DME instruction;Gait training;Functional mobility training;Stair training;Therapeutic activities;Therapeutic exercise;Balance training;Cognitive remediation;Patient/family education    PT Goals (Current goals can be found in the Care Plan section)  Acute Rehab PT Goals Patient Stated Goal: feel better PT Goal Formulation: With patient Time For Goal Achievement: 09/20/19 Potential to Achieve Goals: Good    Frequency Min 3X/week    AM-PAC PT "6 Clicks" Mobility   Outcome Measure Help needed turning from your back to your side while in a flat bed without using bedrails?: None Help needed moving from lying on your back to sitting on the side of a flat bed without using bedrails?: A Little Help needed moving to and from a bed to a chair (including a wheelchair)?: None Help needed standing up from a chair using your arms (e.g., wheelchair or bedside chair)?: None Help needed to walk in hospital room?: A Little Help needed climbing 3-5 steps with a railing? : A Little 6 Click Score: 21    End of Session Equipment Utilized During Treatment: Gait belt Activity Tolerance: Patient tolerated treatment well Patient left: in chair;with call bell/phone within reach;with chair alarm set;with family/visitor present Nurse Communication: Mobility status PT Visit Diagnosis: Unsteadiness on feet (R26.81);Other abnormalities of gait and mobility (R26.89);Muscle weakness (generalized) (M62.81);Difficulty in walking, not elsewhere classified (R26.2)    Time: 0722-5750 PT Time Calculation (min) (ACUTE ONLY): 18 min   Charges:   PT Evaluation $PT Eval Moderate Complexity: 1 Mod          Grantland Want B. Migdalia Dk PT, DPT Acute Rehabilitation Services Pager [phone] Office [phone]   [place] 09/06/2019, 12:13 PM

## 2019-09-06 NOTE — Progress Notes (Signed)
[facility] Progress Note   Subjective: Used AVF for first time yesterday. Infiltrated first venous stick, was able to recannulate and used for 2 hours. Patient bent arm and infiltrated arterial needle. Now bruised and exquisitely tender to touch. + bruit-actually sounds better today-had whistling bruit mid portion of AVF yesterday, not present today.  Otherwise she feels well, denies SOB.   Objective Vitals:   09/05/19 1941 09/06/19 0005 09/06/19 0439 09/06/19 0745  BP: (!) 151/76 (!) 160/68 (!) 142/64   Pulse:   64   Resp:   20   Temp: 98 F (36.7 C) 98 F (36.7 C) 98.1 F (36.7 C) 98.7 F (37.1 C)  TempSrc: Oral Oral Oral Oral  SpO2:   100%   Weight:   52.7 kg   Height:       Physical Exam General:pale, chronically ill appearing pt in NAD Heart:S1,S2, RRR SR on monitor, rate 60s. Lungs:Decreased in bases with bibasilar crackles. No WOB. Abdomen:S, NT Active BS Extremities:Trace BLE edema Dialysis Access: L AVF with T/B-bruit bruised, swollen, exquisitely tender to palpation after infiltrations X 2 07/13    Additional Objective Labs: Basic Metabolic Panel: Recent Labs  Lab 09/02/19 1811 09/03/19 0340 09/05/19 0348 09/05/19 1157 09/06/19 0513  NA  --    < > 138 139 137  K  --    < > 2.8* 3.3* 3.4*  CL  --    < > 100 99 100  CO2  --    < > $R'28 30 26  'nr$ GLUCOSE  --    < > 102* 116* 92  BUN  --    < > 25* 29* 30*  CREATININE  --    < > 3.18* 3.59* 3.50*  CALCIUM  --    < > 7.5* 7.5* 7.5*  PHOS 2.5  --   --  2.2*  --    < > = values in this interval not displayed.   Liver Function Tests: Recent Labs  Lab 09/02/19 1614 09/03/19 0340 09/05/19 1157  AST 22 24  --   ALT 17 21  --   ALKPHOS 72 73  --   BILITOT 0.5 0.9  --   PROT 5.6* 5.6*  --   ALBUMIN 2.5* 2.5* 1.9*   No results for input(s): LIPASE, AMYLASE in the last 168 hours. CBC: Recent Labs  Lab 09/02/19 1614 09/02/19 1614 09/03/19 0340 09/03/19 0340 09/04/19 0853  09/05/19 0348 09/06/19 0513  WBC 7.9   < > 7.2   < > 6.8 6.0 5.4  NEUTROABS 7.0   < >  --   --  5.6 4.2 3.2  HGB 5.8*   < > 8.5*   < > 8.3* 7.3* 7.6*  HCT 19.1*   < > 26.8*   < > 26.6* 23.3* 24.5*  MCV 95.5  --  94.7  --  94.0 95.1 95.3  PLT PLATELET CLUMPS NOTED ON SMEAR, COUNT APPEARS DECREASED   < > 80*   < > PLATELET CLUMPS NOTED ON SMEAR, COUNT APPEARS DECREASED 91* 96*   < > = values in this interval not displayed.   Blood Culture    Component Value Date/Time   SDES BLOOD RIGHT HAND 09/04/2019 0853   SDES BLOOD RIGHT HAND 09/04/2019 0853   SPECREQUEST  09/04/2019 0853    BOTTLES DRAWN AEROBIC ONLY Blood Culture adequate volume   SPECREQUEST  09/04/2019 0853    BOTTLES DRAWN AEROBIC ONLY Blood Culture results may not be optimal due  to an inadequate volume of blood received in culture bottles   CULT  09/04/2019 0853    NO GROWTH 2 DAYS Performed at [facility], [place] [address] [place], [place] [address]    CULT  09/04/2019 0853    NO GROWTH 2 DAYS Performed at [facility], [place] [address] [place], [place] [address]    REPTSTATUS PENDING 09/04/2019 0853   REPTSTATUS PENDING 09/04/2019 0853    Cardiac Enzymes: No results for input(s): CKTOTAL, CKMB, CKMBINDEX, TROPONINI in the last 168 hours. CBG: Recent Labs  Lab 09/02/19 2130 09/03/19 0820 09/03/19 1158 09/03/19 1609 09/03/19 2034  GLUCAP 141* 131* 150* 158* 188*   Iron Studies: No results for input(s): IRON, TIBC, TRANSFERRIN, FERRITIN in the last 72 hours. $RemoveB'@lablastinr3'wCOduDbm$ @ Studies/Results: IR Removal Tun Cv Cath W/O FL  Result Date: 09/04/2019 INDICATION: Patient with history of ESRD on HD s/p tunneled right IJ HD catheter placement by [facility] vascular. Patient now with functioning AVF. Request is made for removal of tunneled HD catheter. EXAM: REMOVAL OF TUNNELED HEMODIALYSIS CATHETER MEDICATIONS: None COMPLICATIONS: None immediate. PROCEDURE: Informed written consent was obtained from the patient  following an explanation of the procedure, risks, benefits and alternatives to treatment. A time out was performed prior to the initiation of the procedure. Maximal barrier sterile technique was utilized including mask, sterile gowns, sterile gloves, large sterile drape, hand hygiene, and Hibiclens. Approximately 5 sutures were removed from site. Utilizing gentle traction, the catheter was removed intact. Hemostasis was obtained with manual compression. A dressing was placed. The patient tolerated the procedure well without immediate post procedural complication. IMPRESSION: Successful removal of tunneled dialysis catheter. Read by: Earley Abide, PA-C Electronically Signed   By: Sandi Mariscal M.D.   On: 09/04/2019 16:03   ECHOCARDIOGRAM COMPLETE  Result Date: 09/04/2019    ECHOCARDIOGRAM REPORT   Patient Name:   ANALEISE MCCLEERY [facility] Date of Exam: 09/04/2019 Medical Rec #:  [id number]      Height:       63.0 in Accession #:    [id number]     Weight:       115.7 lb Date of Birth:  1950/03/06      BSA:          1.533 m Patient Age:    69 years       BP:           141/65 mmHg Patient Gender: F              HR:           73 bpm. Exam Location:  Inpatient Procedure: 2D Echo Indications:    bacteremia 790.7  History:        Patient has no prior history of Echocardiogram examinations. End                 stage renal disease, Signs/Symptoms:Bacteremia and Fever; Risk                 Factors:Hypertension and Diabetes.  Sonographer:    Johny Chess Referring Phys: [id number] [place]  1. Left ventricular ejection fraction, by estimation, is 50 to 55%. The left ventricle has low normal function. The left ventricle has no regional wall motion abnormalities. Left ventricular diastolic parameters are consistent with Grade II diastolic dysfunction (pseudonormalization).  2. Right ventricular systolic function is normal. The right ventricular size is normal.  3. Left atrial size was severely dilated.  4. The mitral  valve  is normal in structure. Mild mitral valve regurgitation. No evidence of mitral stenosis.  5. The aortic valve is normal in structure. Aortic valve regurgitation is not visualized. Mild to moderate aortic valve sclerosis/calcification is present, without any evidence of aortic stenosis.  6. The inferior vena cava is dilated in size with <50% respiratory variability, suggesting right atrial pressure of 15 mmHg. FINDINGS  Left Ventricle: Left ventricular ejection fraction, by estimation, is 50 to 55%. The left ventricle has low normal function. The left ventricle has no regional wall motion abnormalities. The left ventricular internal cavity size was normal in size. There is no left ventricular hypertrophy. Left ventricular diastolic parameters are consistent with Grade II diastolic dysfunction (pseudonormalization). Right Ventricle: The right ventricular size is normal.Right ventricular systolic function is normal. Left Atrium: Left atrial size was severely dilated. Right Atrium: Right atrial size was normal in size. Pericardium: Trivial pericardial effusion is present. Mitral Valve: The mitral valve is normal in structure. Normal mobility of the mitral valve leaflets. Moderate mitral annular calcification. Mild mitral valve regurgitation. No evidence of mitral valve stenosis. Tricuspid Valve: The tricuspid valve is normal in structure. Tricuspid valve regurgitation is trivial. No evidence of tricuspid stenosis. Aortic Valve: The aortic valve is normal in structure. Aortic valve regurgitation is not visualized. Mild to moderate aortic valve sclerosis/calcification is present, without any evidence of aortic stenosis. Pulmonic Valve: The pulmonic valve was normal in structure. Pulmonic valve regurgitation is trivial. No evidence of pulmonic stenosis. Aorta: The aortic root is normal in size and structure. Venous: The inferior vena cava is dilated in size with less than 50% respiratory variability, suggesting right  atrial pressure of 15 mmHg.  LEFT VENTRICLE PLAX 2D LVIDd:         5.20 cm  Diastology LVIDs:         3.40 cm  LV e' lateral:   7.07 cm/s LV PW:         1.00 cm  LV E/e' lateral: 19.7 LV IVS:        0.80 cm  LV e' medial:    7.40 cm/s LVOT diam:     2.30 cm  LV E/e' medial:  18.8 LV SV:         98 LV SV Index:   64 LVOT Area:     4.15 cm  RIGHT VENTRICLE             IVC RV S prime:     13.70 cm/s  IVC diam: 2.80 cm TAPSE (M-mode): 2.2 cm LEFT ATRIUM              Index       RIGHT ATRIUM           Index LA diam:        4.00 cm  2.61 cm/m  RA Area:     14.50 cm LA Vol (A2C):   105.0 ml 68.51 ml/m RA Volume:   33.60 ml  21.92 ml/m LA Vol (A4C):   56.2 ml  36.67 ml/m LA Biplane Vol: 79.7 ml  52.00 ml/m  AORTIC VALVE LVOT Vmax:   122.00 cm/s LVOT Vmean:  79.800 cm/s LVOT VTI:    0.237 m  AORTA Ao Root diam: 3.10 cm Ao Asc diam:  3.30 cm MITRAL VALVE MV Area (PHT): 3.72 cm     SHUNTS MV Decel Time: 204 msec     Systemic VTI:  0.24 m MV E velocity: 139.00 cm/s  Systemic Diam: 2.30 cm MV A  velocity: 110.00 cm/s MV E/A ratio:  1.26 Kirk Ruths MD Electronically signed by Kirk Ruths MD Signature Date/Time: 09/04/2019/2:37:34 PM    Final    Medications:   ceFAZolin (ANCEF) IV 1 g (09/05/19 1559)    carvedilol  3.125 mg Oral BID WC   Chlorhexidine Gluconate Cloth  6 each Topical Daily   Chlorhexidine Gluconate Cloth  6 each Topical Q0600   cyanocobalamin  1,000 mcg Intramuscular Q30 days   darbepoetin (ARANESP) injection - DIALYSIS  100 mcg Intravenous Q Tue-HD   doxercalciferol  2 mcg Intravenous Q T,Th,Sa-HD   feeding supplement (ENSURE ENLIVE)  237 mL Oral TID BM   multivitamin  1 tablet Oral QHS     HD orders: TTS [facility] 3.5hrs, 400/600  56kg,2K/2.25Ca Access: L AVF Heparinnone Mircera61mcgIVq2wks - last 08/19/19 Venofer $RemoveBeforeD'50mg'PcvQWlBXnITPQN$ IVqwk Hectorol76mcg IV qHD   Assessment/Plan: 1. MSSA bacteremia- Afebrilelast 24hrs. BC +MSSA. On Ancef-need to get information on how  long ID wants to continue as OP. Etiology unclear, most likely catheter related.Seen by ID.[place] removed 09/04/2019. [facility] 09/04/2019 NGTD X 2 days.  Has L AVF placed 06/08/2019. Attempted to use 07/13 for first time but infiltrated X 2. Now unable to use-will need to rest. VVS consulted to evaluate AVF and place [facility].  2. Volume overload- CXR with vascular congestion/pulm edemaon adm. Resolved with HD. Was able to remove net UF 1.0 in HD yesterday. Continue lowering volume as tolerated in HD.  3. Confusion: Unclear if this is pre-existing issue or related to current sepsis. Seems much improved today. Per primary. 4. ESRD- New start on HD TTS.Next HD 07/15 pending line placement. Used AVF 07/13 for 1st time and infiltrated X 2. VVS asked to see pt to evaluate AVF and place [facility]. K+ low. Use 4.0 K bath. Hold off supplementing K+ until we have working HD access.  5. Hypertension-BP variable control.No overt excess volume. Continue homeBPmeds.  6. Anemiaof CKD- Hgb initially 5.8 in ED, improved to 8.5 s/p 2unitsPRBC. 7. Secondary Hyperparathyroidism -PO4 low, C Ca OK.  Continue VDRA. Not on binders. 8. Nutrition- Albumin , K+ and PO4 low. Change to regular diet with fluid restrictions. Continue protein supplements.  9. DMT2 - per primary  Alice Burnside H. Moya Duan NP-C 09/06/2019, 9:14 AM  [facility] [phone]

## 2019-09-06 NOTE — Care Management Important Message (Signed)
Important Message  Patient Details  Name: Bailey [name] MRN: [id number] Date of Birth: Oct 03, 1950   Medicare Important Message Given:  Yes     Shelda Altes 09/06/2019, 1:31 PM

## 2019-09-06 NOTE — TOC Progression Note (Signed)
Transition of Care [facility]) - Progression Note    Patient Details  Name: Bailey [name] MRN: [id number] Date of Birth: Sep 20, 1950  [facility] CM/SW Contact  Graves-Bigelow, Ocie Cornfield, RN Phone Number: 09/06/2019, 5:35 PM  Clinical Narrative:  Case Manager spoke with patient regarding physical therapy recommendations. Patient is undecided regarding home care needs at this time. Case Manager provided patient with a Medicare.gov list to review for home health services. Case Manager will continue to follow for additional transition of care needs.   Expected Discharge Plan: [place] Barriers to Discharge: Continued Medical Work up  Expected Discharge Plan and Services Expected Discharge Plan: [place] In-house Referral: NA Discharge Planning Services: CM Consult   Living arrangements for the past 2 months: Single Family Home                   Readmission Risk Interventions Readmission Risk Prevention Plan 09/06/2019  Transportation Screening Complete  HRI or Home Care Consult Complete  Social Work Consult for [place] Planning/Counseling Complete  Palliative Care Screening Not Applicable  Medication Review Press photographer) Complete  Some recent data might be hidden

## 2019-09-06 NOTE — Consult Note (Addendum)
Hospital Consult    Reason for Consult:  Failed access AVF; requesting [facility] Requesting Physician:  Roney Jaffe, MD MRN #:  [id number]  History of Present Illness: This is a 69 y.o. female with history of chronic kidney disease who underwent left brachiocephalic arteriovenous fistula in the left upper arm on June 08, 2019 by Dr. Carlis Abbott.  She was followed postoperatively and her fistula matured nicely. She was advised that the fistula could be accessed on 09/07/2019 at which time the fistula would be 12 weeks post-creation.  Unfortunately, she developed ESRD in the interim and had a right IJ TDC placed.  She then began HD at [facility] on TTS schedule.  She had 2-3 episodes of bleeding from her catheter several weeks ago and required transfusion of 2 u PCs. She was admitted on 09/02/2019 with fever, MSSA bacteremia and   The catheter was removed two days ago. The fistula was then access but was difficult and painful and resulted in infiltration.  We are asked to evaluate for placement of TDC.   She is on no blood thinning or antiplatelet medication. Med hx significant for htn, DM. Husband provides support.  She is interviewed and examined on the nursing floor where she is awake, alert and in no apparent distress.  She says access her arm is still sore from access attempt. She denies hand pain, fever or chills.  She just finished eating her lunch and overall feels well.   Past Medical History:  Diagnosis Date  . Anemia    patient preference - stopped iron   . ARF (acute renal failure) ([place]) 04/2016   dehydration  . Chronic kidney disease    stage 5  . Depression   . Diabetes mellitus    Type II - pt preference - stopped lantus  . Diabetic retinopathy ([place])   . Diverticulitis   . History of kidney stones    passed- 6  . Hypertension   . Osteomyelitis ([place])   . Vitamin B 12 deficiency 06/09/2017  . Vitamin D deficiency   . Wears partial dentures    upper    Past Surgical History:    Procedure Laterality Date  . AMPUTATION Right 08/14/2016   Procedure: RIGHT 1ST RAY AMPUTATION MID-SHAFT;  Surgeon: Newt Minion, MD;  Location: [place];  Service: Orthopedics;  Laterality: Right;  . BASCILIC VEIN TRANSPOSITION Left 06/08/2019   Procedure: LEFT BRACHIOCEPHALIC VEIN CREATION;  Surgeon: Marty Heck, MD;  Location: [place];  Service: Vascular;  Laterality: Left;  . INCISION AND DRAINAGE ABSCESS Left 02/11/2014   Procedure: INCISION AND DRAINAGE ABSCESS Left Buttock;  Surgeon: Jackolyn Confer, MD;  Location: [facility];  Service: General;  Laterality: Left;  . IR REMOVAL TUN CV CATH W/O FL  09/04/2019  . LAPAROSCOPIC SMALL BOWEL RESECTION N/A 05/09/2016   Procedure: LAPAROSCOPIC SMALL BOWEL RESECTION;  Surgeon: Michael Boston, MD;  Location: [facility];  Service: General;  Laterality: N/A;  . LAPAROSCOPY N/A 05/09/2016   Procedure: LAPAROSCOPY DIAGNOSTIC, LYSIS OF ADHESIONS, SMALL BOWEL RESECTION X 2;  Surgeon: Michael Boston, MD;  Location: [facility];  Service: General;  Laterality: N/A;  . TOE AMPUTATION     left foot great toe    No Known Allergies  Prior to Admission medications   Medication Sig Start Date End Date Taking? Authorizing Provider  acetaminophen (TYLENOL) 500 MG tablet Take 1,000 mg by mouth every 6 (six) hours as needed for fever or headache (pain).    Yes [provider]  amlodipine-atorvastatin (CADUET) 10-10 MG tablet Take 1 tablet by mouth daily. 2.5 mg at night per husband   Yes [provider]  carvedilol (COREG) 25 MG tablet Take 25 mg by mouth 2 (two) times daily with a meal.    Yes [provider]  Cholecalciferol (VITAMIN D-3) 125 MCG (5000 UT) TABS Take 5,000 Units by mouth 2 (two) times daily.   Yes [provider]  cyanocobalamin (,VITAMIN B-12,) 1000 MCG/ML injection Inject 1,000 mcg into the muscle every 30 (thirty) days.   Yes [provider]  escitalopram (LEXAPRO) 5 MG tablet Take 5 mg by mouth daily as needed  (anxiety).   Yes [provider]  furosemide (LASIX) 20 MG tablet Take 20 mg by mouth daily.   Yes [provider]  sodium bicarbonate 650 MG tablet Take 650 mg by mouth 2 (two) times daily.   Yes [provider]  Vitamin D, Ergocalciferol, (DRISDOL) 1.25 MG (50000 UNIT) CAPS capsule Take 50,000 Units by mouth every Sunday.    Yes [provider]  NIFEdipine (PROCARDIA-XL/NIFEDICAL-XL) 30 MG 24 hr tablet Take 30 mg by mouth at bedtime.     [provider]    Social History   Socioeconomic History  . Marital status: Married    Spouse name: Not on file  . Number of children: Not on file  . Years of education: Not on file  . Highest education level: Not on file  Occupational History  . Not on file  Tobacco Use  . Smoking status: Never Smoker  . Smokeless tobacco: Never Used  Vaping Use  . Vaping Use: Never used  Substance and Sexual Activity  . Alcohol use: No    Alcohol/week: 0.0 standard drinks  . Drug use: No  . Sexual activity: Not on file  Other Topics Concern  . Not on file  Social History Narrative  . Not on file   Social Determinants of Health   Financial Resource Strain:   . Difficulty of Paying Living Expenses:   Food Insecurity:   . Worried About Charity fundraiser in the Last Year:   . Arboriculturist in the Last Year:   Transportation Needs:   . Film/video editor (Medical):   Marland Kitchen Lack of Transportation (Non-Medical):   Physical Activity:   . Days of Exercise per Week:   . Minutes of Exercise per Session:   Stress:   . Feeling of Stress :   Social Connections:   . Frequency of Communication with Friends and Family:   . Frequency of Social Gatherings with Friends and Family:   . Attends Religious Services:   . Active Member of Clubs or Organizations:   . Attends Archivist Meetings:   Marland Kitchen Marital Status:   Intimate Partner Violence:   . Fear of Current or Ex-Partner:   . Emotionally Abused:   Marland Kitchen  Physically Abused:   . Sexually Abused:      Family History  Problem Relation Age of Onset  . Diabetes Mother   . Asthma Sister     ROS: [x]  Positive   [ ]  Negative   [ ]  All sytems reviewed and are negative  Cardiac: []  chest pain/pressure []  palpitations []  SOB lying flat []  DOE  Vascular: []  pain in legs while walking []  pain in legs at rest []  pain in legs at night []  non-healing ulcers []  hx of DVT []  swelling in legs  Pulmonary: []  productive cough []   asthma/wheezing '[]'$  home O2  Neurologic: $RemoveBefo'[]'OrEQDNwmeJO$  weakness in $RemoveBef'[]'rgYxiqArYV$  arms $Rem'[]'Tmlr$  legs $Rem'[]'dwDR$  numbness in $RemoveBef'[]'zhDzaWzKrQ$  arms $Rem'[]'DvCR$  legs $Rem'[]'zJew$  hx of CVA $Remo'[]'TJHPL$  mini stroke $RemoveBef'[]'zbjDlTgKJf$ difficulty speaking or slurred speech $RemoveBefore'[]'FPDsCmhikaeTJ$  temporary loss of vision in one eye $Remov'[]'fEEFYU$  dizziness  Hematologic: $RemoveBefor'[]'skKXDkOGLots$  hx of cancer $RemoveB'[]'ZiuJCBID$  bleeding problems $RemoveBeforeDEI'[]'gavESaJQEQxZUhAl$  problems with blood clotting easily  Endocrine:   '[x]'$  diabetes $RemoveB'[]'gnbQXGLo$  thyroid disease  GI $R'[]'ez$  vomiting blood $RemoveBefore'[]'SwskPhlGxYrrC$  blood in stool  GU: $Re'[]'Yut$  CKD/renal failure $RemoveBeforeDEI'[]'bLIaCdTRpbJoyCwL$  HD'[]'$  M/W/F or $Remove'[]'EDNNcYQ$  T/T/S $Remo'[]'jqkgo$  burning with urination $RemoveBefore'[]'eKXFtCjcvSsqH$  blood in urine  Psychiatric: $RemoveBefor'[]'LzHIBdookQZV$  anxiety $Remove'[x]'jOsbtCi$  depression  Musculoskeletal: $RemoveBeforeDEI'[]'IhAwzNmFimUUqyQe$  arthritis $RemoveBe'[]'OKDjbhuhY$  joint pain  Integumentary: $RemoveBeforeD'[]'haNtGClwkOXMls$  rashes $Remov'[]'enxzmL$  ulcers  Constitutional: $RemoveBeforeDE'[]'YYwiijDbCtcUOdR$  fever $Remo'[]'ebvRn$  chills   Physical Examination  Vitals:   09/06/19 0439 09/06/19 0745  BP: (!) 142/64   Pulse: 64   Resp: 20   Temp: 98.1 F (36.7 C) 98.7 F (37.1 C)  SpO2: 100%    Body mass index is 20.58 kg/m.  General:  WDWN in NAD Gait: Not observed HENT: WNL, normocephalic Pulmonary: normal non-labored breathing, without Rales, rhonchi,  wheezing Cardiac: regular rate and rhythm  Skin: without rashes Extremities: without ischemic changes, without Gangrene , without cellulitis; without open wounds;  Left UE: palpable thrill, mild ecchymosis over fistula. 2+ radial pulse. Motor, sensation intact Musculoskeletal: no muscle wasting or atrophy  Neurologic: A&O X 3;  No focal weakness or paresthesias are detected; speech is fluent/normal Psychiatric:   The pt has Normal affect.  CBC    Component Value Date/Time   WBC 5.4 09/06/2019 0513   RBC 2.57 (L) 09/06/2019 0513   HGB 7.6 (L) 09/06/2019 0513   HGB 7.2 (L) 07/12/2019 1507   HCT 24.5 (L) 09/06/2019 0513   PLT 96 (L) 09/06/2019 0513   PLT 86 (L) 07/12/2019 1507   MCV 95.3 09/06/2019 0513   MCV 89.6 02/10/2014 1127   MCH 29.6 09/06/2019 0513   MCHC 31.0 09/06/2019 0513   RDW 13.9 09/06/2019 0513   LYMPHSABS 1.5 09/06/2019 0513   MONOABS 0.5 09/06/2019 0513   EOSABS 0.1 09/06/2019 0513   BASOSABS 0.0 09/06/2019 0513    BMET    Component Value Date/Time   NA 137 09/06/2019 0513   K 3.4 (L) 09/06/2019 0513   CL 100 09/06/2019 0513   CO2 26 09/06/2019 0513   GLUCOSE 92 09/06/2019 0513   BUN 30 (H) 09/06/2019 0513   CREATININE 3.50 (H) 09/06/2019 0513   CREATININE 7.33 (HH) 07/12/2019 1507   CREATININE 0.74 02/10/2014 1220   CALCIUM 7.5 (L) 09/06/2019 0513   GFRNONAA 13 (L) 09/06/2019 0513   GFRNONAA 5 (L) 07/12/2019 1507   GFRAA 15 (L) 09/06/2019 0513   GFRAA 6 (L) 07/12/2019 1507    COAGS: Lab Results  Component Value Date   INR 1.5 (H) 09/03/2019   INR 1.3 (H) 09/02/2019   INR 1.2 08/29/2019     ASSESSMENT/PLAN: This is a 69 y.o. female with recent left B-C AVF with hx of CKD.  She had good maturation of her fistula, but required HD before fully matured and TDC placed.  Had significant bleeding from catheter and presented this admission with fever and MSSA bacteremia.  Catheter has been removed and she is receiving cefazolin IV.  Overall improved and she has been afebrile for over 40 hours. Normal WBC count.  -discussed with Dr. Donnetta Hutching, on-call vascular surgeon.  We will plan [facility] placement tomorrow and make her NPO after MN tonight   Risa Grill, PA-C [facility] [phone]  I have examined the patient, reviewed and agree with above.  We will place new [facility] tomorrow.  Left arm AV fistula with excellent thrill.  Infiltration in mid upper  arm.  Discussed at length with the patient this is a very bad prognostic indicator for long-term use of her fistula.  Will continue to watch as she resolves her infiltration.  May need conversion to AV graft.  Certainly this is concerning with her admission for sepsis and would not plan graft during this admission  Curt Jews, MD 09/06/2019 4:21 PM

## 2019-09-06 NOTE — Progress Notes (Signed)
PROGRESS NOTE    Bailey [name]  UUV:[id number] DOB: [birth year] DOA: 09/02/2019 PCP: [name], MD   Brief Narrative:  Patient is a 69 year old female with history of ESRD on dialysis, type 2 diabetes mellitus, anemia of chronic disease, right foot osteomyelitis, vitamin B12 deficiency, hypertension who presented with generalized weakness.  On presentation she was febrile, hemoglobin of 5.8 but no leukocytosis.  She was transfused in the emergency department but developed acute respite distress that night after starting blood transfusion with suspicion for TACO.  Blood cultures showed MSSA.  Started on Ancef.  ID consulted today: 09/04/2019 did not show any evidence of vegetation.  Repeat blood cultures have been negative.  ID recommended to continue antibiotics for total of 6 weeks.  Vascular surgery planning to do temporary dialysis catheter tomorrow.  Nephrology following for dialysis.  Assessment & Plan:   Principal Problem:   Symptomatic anemia Active Problems:   ESRD on dialysis (HCC)   Benign essential HTN   Thrombocytopenia (HCC)   Hypokalemia   Fever of unknown origin   Malnutrition of moderate degree  Septicemia/MSSA bacteremia: Presented with generalized weakness, fever.  Suspected to be catheter related.  Hemodialysis catheter removed on 09/04/2019 by IR.  Repeat cultures have been negative.  2D echo did not show any valvular vegetation.  Plan to continue his Ancef for total of 6 weeks.  ID was following.  Last day of antibiotic will be 8/23.  Currently she is hemodynamically stable.  Normocytic anemia: Most likely associated with ESRD.  Currently hemoglobin in the range of 7.  She was given a unit of PRBC during this hospitalization.    Transfusion associated circulatory overload/acute hypoxic respiratory failure :there was suspicion for transfusion associated circulatory overload.  Chest x-ray showed increasing pulmonary vascular consistent with  pulmonary edema.   Received diuretics.  Volume being managed by dialysis.  Currently on room air.  ESRD: Being dialyzed on TTS schedule.  Nephrology following.  She underwent AV fistula placement on left upper arm by vascular surgery, evidently is not working.  Plan is to place temporary dialysis catheter tomorrow.  Hypokalemia: Being monitored and supplemented.  Hypertension: On Coreg.  Nifedipine on hold.  Chronic anxiety/depression: Continue home medications  Thrombocytopenia: Chronic.  Continue to monitor  Diabetes type 2: Diet controlled.  Last hemoglobin C 5.5.  Generalized weakness: Likely associated with sepsis.  PT recommended skilled nursing facility on discharge.  Nutrition Problem: Moderate Malnutrition Etiology: chronic illness (DM, ESRD)      DVT prophylaxis:SCD Code Status: Full Family Communication: None present at the bedside Status is: Inpatient  Remains inpatient appropriate because:Ongoing diagnostic testing needed not appropriate for outpatient work up   Dispo:  Patient From: Home  Planned Disposition: Home with Health Care S[facility]  Expected discharge date: 09/08/19  Medically stable for discharge: No     Consultants: ID,nephro,vascular surgery   Antimicrobials:  Anti-infectives (From admission, onward)   Start     Dose/Rate Route Frequency Ordered Stop   09/03/19 2200  vancomycin (VANCOREADY) IVPB 500 mg/100 mL  Status:  Discontinued        500 mg 100 mL/hr over 60 Minutes Intravenous Every 24 hours 09/02/19 1915 09/03/19 0925   09/03/19 2000  ceFEPIme (MAXIPIME) 1 g in sodium chloride 0.9 % 100 mL IVPB  Status:  Discontinued        1 g 200 mL/hr over 30 Minutes Intravenous Every 24 hours 09/03/19 0936 09/03/19 1147   09/03/19 1300  ceFAZolin (ANCEF) IVPB  1 g/50 mL premix     Discontinue     1 g 100 mL/hr over 30 Minutes Intravenous Every 24 hours 09/03/19 1246     09/02/19 2100  vancomycin (VANCOREADY) IVPB 1250 mg/250 mL        1,250 mg 166.7 mL/hr over 90  Minutes Intravenous  Once 09/02/19 1915 09/02/19 2314   09/02/19 2000  ceFEPIme (MAXIPIME) 2 g in sodium chloride 0.9 % 100 mL IVPB  Status:  Discontinued        2 g 200 mL/hr over 30 Minutes Intravenous Every 24 hours 09/02/19 1915 09/03/19 0936      Subjective: Patient seen and examined at the bedside this afternoon.  Comfortable, hemodynamically stable but complains of generalized weakness.  Lying on bed.  Anxious about temporary dialysis catheter placement procedure tomorrow.  Objective: Vitals:   09/05/19 1941 09/06/19 0005 09/06/19 0439 09/06/19 0745  BP: (!) 151/76 (!) 160/68 (!) 142/64   Pulse:   64   Resp:   20   Temp: 98 F (36.7 C) 98 F (36.7 C) 98.1 F (36.7 C) 98.7 F (37.1 C)  TempSrc: Oral Oral Oral Oral  SpO2:   100%   Weight:   52.7 kg   Height:        Intake/Output Summary (Last 24 hours) at 09/06/2019 1337 Last data filed at 09/06/2019 0900 Gross per 24 hour  Intake 600 ml  Output 1200 ml  Net -600 ml   Filed Weights   09/05/19 1300 09/05/19 1515 09/06/19 0439  Weight: 54.9 kg 53.9 kg 52.7 kg    Examination:  General exam: Pleasant female, generalized weakness HEENT:PERRL,Oral mucosa moist, Ear/Nose normal on gross exam Respiratory system: Bilateral equal air entry, normal vesicular breath sounds, no wheezes or crackles  Cardiovascular system: S1 & S2 heard, RRR. No JVD, murmurs, rubs, gallops or clicks. No pedal edema. Gastrointestinal system: Abdomen is nondistended, soft and nontender. No organomegaly or masses felt. Normal bowel sounds heard. Central nervous system: Alert and oriented. No focal neurological deficits. Extremities: No edema, no clubbing ,no cyanosis Skin: No rashes, lesions or ulcers,no icterus ,no pallor,AV fistula  on left arm   Data Reviewed: I have personally reviewed following labs and imaging studies  CBC: Recent Labs  Lab 09/02/19 1614 09/03/19 0340 09/04/19 0853 09/05/19 0348 09/06/19 0513  WBC 7.9 7.2 6.8 6.0  5.4  NEUTROABS 7.0  --  5.6 4.2 3.2  HGB 5.8* 8.5* 8.3* 7.3* 7.6*  HCT 19.1* 26.8* 26.6* 23.3* 24.5*  MCV 95.5 94.7 94.0 95.1 95.3  PLT PLATELET CLUMPS NOTED ON SMEAR, COUNT APPEARS DECREASED 80* PLATELET CLUMPS NOTED ON SMEAR, COUNT APPEARS DECREASED 91* 96*   Basic Metabolic Panel: Recent Labs  Lab 09/02/19 1614 09/02/19 1811 09/03/19 0340 09/04/19 0853 09/05/19 0348 09/05/19 1157 09/06/19 0513  NA   < >  --  138 139 138 139 137  K   < >  --  3.7 3.0* 2.8* 3.3* 3.4*  CL   < >  --  97* 101 100 99 100  CO2   < >  --  $R'28 27 28 30 26  'Lt$ GLUCOSE   < >  --  192* 115* 102* 116* 92  BUN   < >  --  15 11 25* 29* 30*  CREATININE   < >  --  2.66* 1.97* 3.18* 3.59* 3.50*  CALCIUM   < >  --  8.2* 7.9* 7.5* 7.5* 7.5*  MG  --  1.7  --   --   --   --   --  PHOS  --  2.5  --   --   --  2.2*  --    < > = values in this interval not displayed.   GFR: Estimated Creatinine Clearance: 12.4 mL/min (A) (by C-G formula based on SCr of 3.5 mg/dL (H)). Liver Function Tests: Recent Labs  Lab 09/02/19 1614 09/03/19 0340 09/05/19 1157  AST 22 24  --   ALT 17 21  --   ALKPHOS 72 73  --   BILITOT 0.5 0.9  --   PROT 5.6* 5.6*  --   ALBUMIN 2.5* 2.5* 1.9*   No results for input(s): LIPASE, AMYLASE in the last 168 hours. No results for input(s): AMMONIA in the last 168 hours. Coagulation Profile: Recent Labs  Lab 09/02/19 1614 09/03/19 0340  INR 1.3* 1.5*   Cardiac Enzymes: No results for input(s): CKTOTAL, CKMB, CKMBINDEX, TROPONINI in the last 168 hours. BNP (last 3 results) No results for input(s): PROBNP in the last 8760 hours. HbA1C: No results for input(s): HGBA1C in the last 72 hours. CBG: Recent Labs  Lab 09/02/19 2130 09/03/19 0820 09/03/19 1158 09/03/19 1609 09/03/19 2034  GLUCAP 141* 131* 150* 158* 188*   Lipid Profile: No results for input(s): CHOL, HDL, LDLCALC, TRIG, CHOLHDL, LDLDIRECT in the last 72 hours. Thyroid Function Tests: No results for input(s): TSH,  T4TOTAL, FREET4, T3FREE, THYROIDAB in the last 72 hours. Anemia Panel: Recent Labs    09/04/19 1229  VITAMINB12 755   Sepsis Labs: Recent Labs  Lab 09/02/19 1614 09/03/19 0340 09/04/19 0853 09/05/19 0348 09/06/19 0513  PROCALCITON  --  25.69 52.51 49.80 35.66  LATICACIDVEN 1.7  --   --   --   --     Recent Results (from the past 240 hour(s))  Culture, blood (Routine x 2)     Status: Abnormal   Collection Time: 09/02/19  4:10 PM   Specimen: BLOOD  Result Value Ref Range Status   Specimen Description BLOOD BLOOD LEFT FOREARM  Final   Special Requests   Final    BOTTLES DRAWN AEROBIC AND ANAEROBIC Blood Culture adequate volume   Culture  Setup Time   Final    GRAM POSITIVE COCCI IN CLUSTERS IN BOTH AEROBIC AND ANAEROBIC BOTTLES CRITICAL VALUE NOTED.  VALUE IS CONSISTENT WITH PREVIOUSLY REPORTED AND CALLED VALUE.    Culture (A)  Final    STAPHYLOCOCCUS AUREUS SUSCEPTIBILITIES PERFORMED ON PREVIOUS CULTURE WITHIN THE LAST 5 DAYS. Performed at [facility], [place] [address] [place], [place] [address]    Report Status 09/06/2019 FINAL  Final  Culture, blood (Routine x 2)     Status: Abnormal   Collection Time: 09/02/19  4:14 PM   Specimen: BLOOD  Result Value Ref Range Status   Specimen Description BLOOD SITE NOT SPECIFIED  Final   Special Requests   Final    BOTTLES DRAWN AEROBIC AND ANAEROBIC Blood Culture results may not be optimal due to an excessive volume of blood received in culture bottles   Culture  Setup Time   Final    GRAM POSITIVE COCCI IN CLUSTERS IN BOTH AEROBIC AND ANAEROBIC BOTTLES CRITICAL RESULT CALLED TO, READ BACK BY AND VERIFIED WITH: PHARMD K. PIERCE [phone] [id number] FCP Performed at [facility], [place] [address], [place], [place] [address]    Culture STAPHYLOCOCCUS AUREUS (A)  Final   Report Status 09/05/2019 FINAL  Final   Organism ID, Bacteria STAPHYLOCOCCUS AUREUS  Final      Susceptibility  Staphylococcus aureus - MIC*     CIPROFLOXACIN <=0.5 SENSITIVE Sensitive     ERYTHROMYCIN RESISTANT Resistant     GENTAMICIN <=0.5 SENSITIVE Sensitive     OXACILLIN <=0.25 SENSITIVE Sensitive     TETRACYCLINE <=1 SENSITIVE Sensitive     VANCOMYCIN <=0.5 SENSITIVE Sensitive     TRIMETH/SULFA <=10 SENSITIVE Sensitive     CLINDAMYCIN RESISTANT Resistant     RIFAMPIN <=0.5 SENSITIVE Sensitive     Inducible Clindamycin POSITIVE Resistant     * STAPHYLOCOCCUS AUREUS  Blood Culture ID Panel (Reflexed)     Status: Abnormal   Collection Time: 09/02/19  4:14 PM  Result Value Ref Range Status   Enterococcus species NOT DETECTED NOT DETECTED Final   Listeria monocytogenes NOT DETECTED NOT DETECTED Final   Staphylococcus species DETECTED (A) NOT DETECTED Final    Comment: CRITICAL RESULT CALLED TO, READ BACK BY AND VERIFIED WITH: PHARMD K. PIERCE [phone] FCP    Staphylococcus aureus (BCID) DETECTED (A) NOT DETECTED Final    Comment: Methicillin (oxacillin) susceptible Staphylococcus aureus (MSSA). Preferred therapy is anti staphylococcal beta lactam antibiotic (Cefazolin or Nafcillin), unless clinically contraindicated. CRITICAL RESULT CALLED TO, READ BACK BY AND VERIFIED WITH: PHARMD K. PIERCE [phone] FCP    Methicillin resistance NOT DETECTED NOT DETECTED Final   Streptococcus species NOT DETECTED NOT DETECTED Final   Streptococcus agalactiae NOT DETECTED NOT DETECTED Final   Streptococcus pneumoniae NOT DETECTED NOT DETECTED Final   Streptococcus pyogenes NOT DETECTED NOT DETECTED Final   Acinetobacter baumannii NOT DETECTED NOT DETECTED Final   Enterobacteriaceae species NOT DETECTED NOT DETECTED Final   Enterobacter cloacae complex NOT DETECTED NOT DETECTED Final   Escherichia coli NOT DETECTED NOT DETECTED Final   Klebsiella oxytoca NOT DETECTED NOT DETECTED Final   Klebsiella pneumoniae NOT DETECTED NOT DETECTED Final   Proteus species NOT DETECTED NOT DETECTED Final   Serratia marcescens NOT DETECTED NOT  DETECTED Final   Haemophilus influenzae NOT DETECTED NOT DETECTED Final   Neisseria meningitidis NOT DETECTED NOT DETECTED Final   Pseudomonas aeruginosa NOT DETECTED NOT DETECTED Final   Candida albicans NOT DETECTED NOT DETECTED Final   Candida glabrata NOT DETECTED NOT DETECTED Final   Candida krusei NOT DETECTED NOT DETECTED Final   Candida parapsilosis NOT DETECTED NOT DETECTED Final   Candida tropicalis NOT DETECTED NOT DETECTED Final    Comment: Performed at [facility], [place] [address], [place], [place] [address]  SARS Coronavirus 2 by RT PCR (hospital order, performed in [facility] hospital lab) Nasopharyngeal Nasopharyngeal Swab     Status: None   Collection Time: 09/02/19  5:19 PM   Specimen: Nasopharyngeal Swab  Result Value Ref Range Status   SARS Coronavirus 2 NEGATIVE NEGATIVE Final    Comment: (NOTE) SARS-CoV-2 target nucleic acids are NOT DETECTED.  The SARS-CoV-2 RNA is generally detectable in upper and lower respiratory specimens during the acute phase of infection. The lowest concentration of SARS-CoV-2 viral copies this assay can detect is 250 copies / mL. A negative result does not preclude SARS-CoV-2 infection and should not be used as the sole basis for treatment or other patient management decisions.  A negative result may occur with improper specimen collection / handling, submission of specimen other than nasopharyngeal swab, presence of viral mutation(s) within the areas targeted by this assay, and inadequate number of viral copies (<250 copies / mL). A negative result must be combined with clinical observations, patient history, and epidemiological information.  Fact  Sheet for Patients:   StrictlyIdeas.no  Fact Sheet for Healthcare Providers: BankingDealers.co.za  This test is not yet approved or  cleared by the [place] FDA and has been authorized for detection and/or diagnosis of SARS-CoV-2  by FDA under an Emergency Use Authorization (EUA).  This EUA will remain in effect (meaning this test can be used) for the duration of the COVID-19 declaration under Section 564(b)(1) of the Act, 21 U.S.C. section 360bbb-3(b)(1), unless the authorization is terminated or revoked sooner.  Performed at [facility], [place] [address] [place], [place] [address]   Urine culture     Status: Abnormal   Collection Time: 09/03/19  5:58 AM   Specimen: Urine, Random  Result Value Ref Range Status   Specimen Description URINE, RANDOM  Final   Special Requests NONE  Final   Culture (A)  Final    <10,000 COLONIES/mL INSIGNIFICANT GROWTH Performed at [facility], [place] [address], [place], [place] [address]    Report Status 09/04/2019 FINAL  Final  MRSA PCR Screening     Status: None   Collection Time: 09/03/19  5:58 AM   Specimen: Nasopharyngeal  Result Value Ref Range Status   MRSA by PCR NEGATIVE NEGATIVE Final    Comment:        The GeneXpert MRSA Assay (FDA approved for NASAL specimens only), is one component of a comprehensive MRSA colonization surveillance program. It is not intended to diagnose MRSA infection nor to guide or monitor treatment for MRSA infections. Performed at [facility], [place] [address] [place], [place] [address]   Culture, blood (Routine X 2) w Reflex to ID Panel     Status: None (Preliminary result)   Collection Time: 09/04/19  8:53 AM   Specimen: BLOOD RIGHT HAND  Result Value Ref Range Status   Specimen Description BLOOD RIGHT HAND  Final   Special Requests   Final    BOTTLES DRAWN AEROBIC ONLY Blood Culture adequate volume   Culture   Final    NO GROWTH 2 DAYS Performed at [facility], [place] [address], [place], [place] [address]    Report Status PENDING  Incomplete  Culture, blood (Routine X 2) w Reflex to ID Panel     Status: None (Preliminary result)   Collection Time: 09/04/19  8:53 AM   Specimen: BLOOD RIGHT HAND  Result  Value Ref Range Status   Specimen Description BLOOD RIGHT HAND  Final   Special Requests   Final    BOTTLES DRAWN AEROBIC ONLY Blood Culture results may not be optimal due to an inadequate volume of blood received in culture bottles   Culture   Final    NO GROWTH 2 DAYS Performed at [facility], [place] [address], [place], [place] [address]    Report Status PENDING  Incomplete         Radiology Studies: IR Removal Tun Cv Cath W/O FL  Result Date: 09/04/2019 INDICATION: Patient with history of ESRD on HD s/p tunneled right IJ HD catheter placement by [facility] vascular. Patient now with functioning AVF. Request is made for removal of tunneled HD catheter. EXAM: REMOVAL OF TUNNELED HEMODIALYSIS CATHETER MEDICATIONS: None COMPLICATIONS: None immediate. PROCEDURE: Informed written consent was obtained from the patient following an explanation of the procedure, risks, benefits and alternatives to treatment. A time out was performed prior to the initiation of the procedure. Maximal barrier sterile technique was utilized including mask, sterile gowns, sterile gloves,  large sterile drape, hand hygiene, and Hibiclens. Approximately 5 sutures were removed from site. Utilizing gentle traction, the catheter was removed intact. Hemostasis was obtained with manual compression. A dressing was placed. The patient tolerated the procedure well without immediate post procedural complication. IMPRESSION: Successful removal of tunneled dialysis catheter. Read by: Earley Abide, PA-C Electronically Signed   By: Sandi Mariscal M.D.   On: 09/04/2019 16:03        Scheduled Meds: . carvedilol  3.125 mg Oral BID WC  . Chlorhexidine Gluconate Cloth  6 each Topical Daily  . Chlorhexidine Gluconate Cloth  6 each Topical Q0600  . cyanocobalamin  1,000 mcg Intramuscular Q30 days  . darbepoetin (ARANESP) injection - DIALYSIS  100 mcg Intravenous Q Tue-HD  . doxercalciferol  2 mcg Intravenous Q T,Th,Sa-HD  . feeding  supplement (ENSURE ENLIVE)  237 mL Oral TID BM  . multivitamin  1 tablet Oral QHS   Continuous Infusions: .  ceFAZolin (ANCEF) IV 1 g (09/05/19 1559)     LOS: 4 days    Time spent:25 mins. More than 50% of that time was spent in counseling and/or coordination of care.      Shelly Coss, MD [facility] P7/14/2021, 1:37 PM

## 2019-09-06 NOTE — H&P (View-Only) (Signed)
Hospital Consult    Reason for Consult:  Failed access AVF; requesting [facility] Requesting Physician:  Roney Jaffe, MD MRN #:  [id number]  History of Present Illness: This is a 69 y.o. female with history of chronic kidney disease who underwent left brachiocephalic arteriovenous fistula in the left upper arm on June 08, 2019 by Dr. Carlis Abbott.  She was followed postoperatively and her fistula matured nicely. She was advised that the fistula could be accessed on 09/07/2019 at which time the fistula would be 12 weeks post-creation.  Unfortunately, she developed ESRD in the interim and had a right IJ TDC placed.  She then began HD at [facility] on TTS schedule.  She had 2-3 episodes of bleeding from her catheter several weeks ago and required transfusion of 2 u PCs. She was admitted on 09/02/2019 with fever, MSSA bacteremia and   The catheter was removed two days ago. The fistula was then access but was difficult and painful and resulted in infiltration.  We are asked to evaluate for placement of TDC.   She is on no blood thinning or antiplatelet medication. Med hx significant for htn, DM. Husband provides support.  She is interviewed and examined on the nursing floor where she is awake, alert and in no apparent distress.  She says access her arm is still sore from access attempt. She denies hand pain, fever or chills.  She just finished eating her lunch and overall feels well.   Past Medical History:  Diagnosis Date  . Anemia    patient preference - stopped iron   . ARF (acute renal failure) ([place]) 04/2016   dehydration  . Chronic kidney disease    stage 5  . Depression   . Diabetes mellitus    Type II - pt preference - stopped lantus  . Diabetic retinopathy ([place])   . Diverticulitis   . History of kidney stones    passed- 6  . Hypertension   . Osteomyelitis ([place])   . Vitamin B 12 deficiency 06/09/2017  . Vitamin D deficiency   . Wears partial dentures    upper    Past Surgical History:    Procedure Laterality Date  . AMPUTATION Right 08/14/2016   Procedure: RIGHT 1ST RAY AMPUTATION MID-SHAFT;  Surgeon: Newt Minion, MD;  Location: [place];  Service: Orthopedics;  Laterality: Right;  . BASCILIC VEIN TRANSPOSITION Left 06/08/2019   Procedure: LEFT BRACHIOCEPHALIC VEIN CREATION;  Surgeon: Marty Heck, MD;  Location: [place];  Service: Vascular;  Laterality: Left;  . INCISION AND DRAINAGE ABSCESS Left 02/11/2014   Procedure: INCISION AND DRAINAGE ABSCESS Left Buttock;  Surgeon: Jackolyn Confer, MD;  Location: [facility];  Service: General;  Laterality: Left;  . IR REMOVAL TUN CV CATH W/O FL  09/04/2019  . LAPAROSCOPIC SMALL BOWEL RESECTION N/A 05/09/2016   Procedure: LAPAROSCOPIC SMALL BOWEL RESECTION;  Surgeon: Michael Boston, MD;  Location: [facility];  Service: General;  Laterality: N/A;  . LAPAROSCOPY N/A 05/09/2016   Procedure: LAPAROSCOPY DIAGNOSTIC, LYSIS OF ADHESIONS, SMALL BOWEL RESECTION X 2;  Surgeon: Michael Boston, MD;  Location[facility];  Service: General;  Laterality: N/A;  . TOE AMPUTATION     left foot great toe    No Known Allergies  Prior to Admission medications   Medication Sig Start Date End Date Taking? Authorizing Provider  acetaminophen (TYLENOL) 500 MG tablet Take 1,000 mg by mouth every 6 (six) hours as needed for fever or headache (pain).    Yes [provider]  amlodipine-atorvastatin (CADUET) 10-10 MG tablet Take 1 tablet by mouth daily. 2.5 mg at night per husband   Yes [provider]  carvedilol (COREG) 25 MG tablet Take 25 mg by mouth 2 (two) times daily with a meal.    Yes [provider]  Cholecalciferol (VITAMIN D-3) 125 MCG (5000 UT) TABS Take 5,000 Units by mouth 2 (two) times daily.   Yes [provider]  cyanocobalamin (,VITAMIN B-12,) 1000 MCG/ML injection Inject 1,000 mcg into the muscle every 30 (thirty) days.   Yes [provider]  escitalopram (LEXAPRO) 5 MG tablet Take 5 mg by mouth daily as needed  (anxiety).   Yes [provider]  furosemide (LASIX) 20 MG tablet Take 20 mg by mouth daily.   Yes [provider]  sodium bicarbonate 650 MG tablet Take 650 mg by mouth 2 (two) times daily.   Yes [provider]  Vitamin D, Ergocalciferol, (DRISDOL) 1.25 MG (50000 UNIT) CAPS capsule Take 50,000 Units by mouth every Sunday.    Yes [provider]  NIFEdipine (PROCARDIA-XL/NIFEDICAL-XL) 30 MG 24 hr tablet Take 30 mg by mouth at bedtime.     [provider]    Social History   Socioeconomic History  . Marital status: Married    Spouse name: Not on file  . Number of children: Not on file  . Years of education: Not on file  . Highest education level: Not on file  Occupational History  . Not on file  Tobacco Use  . Smoking status: Never Smoker  . Smokeless tobacco: Never Used  Vaping Use  . Vaping Use: Never used  Substance and Sexual Activity  . Alcohol use: No    Alcohol/week: 0.0 standard drinks  . Drug use: No  . Sexual activity: Not on file  Other Topics Concern  . Not on file  Social History Narrative  . Not on file   Social Determinants of Health   Financial Resource Strain:   . Difficulty of Paying Living Expenses:   Food Insecurity:   . Worried About Charity fundraiser in the Last Year:   . Arboriculturist in the Last Year:   Transportation Needs:   . Film/video editor (Medical):   Marland Kitchen Lack of Transportation (Non-Medical):   Physical Activity:   . Days of Exercise per Week:   . Minutes of Exercise per Session:   Stress:   . Feeling of Stress :   Social Connections:   . Frequency of Communication with Friends and Family:   . Frequency of Social Gatherings with Friends and Family:   . Attends Religious Services:   . Active Member of Clubs or Organizations:   . Attends Archivist Meetings:   Marland Kitchen Marital Status:   Intimate Partner Violence:   . Fear of Current or Ex-Partner:   . Emotionally Abused:   Marland Kitchen  Physically Abused:   . Sexually Abused:      Family History  Problem Relation Age of Onset  . Diabetes Mother   . Asthma Sister     ROS: [x]  Positive   [ ]  Negative   [ ]  All sytems reviewed and are negative  Cardiac: []  chest pain/pressure []  palpitations []  SOB lying flat []  DOE  Vascular: []  pain in legs while walking []  pain in legs at rest []  pain in legs at night []  non-healing ulcers []  hx of DVT []  swelling in legs  Pulmonary: []  productive cough []   asthma/wheezing '[]'$  home O2  Neurologic: $RemoveBefo'[]'SHNtCRZPrca$  weakness in $RemoveBef'[]'ukrKWdjNiZ$  arms $Rem'[]'NBxq$  legs $Rem'[]'JOJx$  numbness in $RemoveBef'[]'nwLdQBvmAO$  arms $Rem'[]'brKu$  legs $Rem'[]'ZYCV$  hx of CVA $Remo'[]'IkTko$  mini stroke $RemoveBef'[]'XoelPMFzgD$ difficulty speaking or slurred speech $RemoveBefore'[]'GZmJpnHjAIxRe$  temporary loss of vision in one eye $Remov'[]'tFESSq$  dizziness  Hematologic: $RemoveBefor'[]'ndIGrkeXJmYF$  hx of cancer $RemoveB'[]'SyjqCiZV$  bleeding problems $RemoveBeforeDEI'[]'EhYFaLfmInAzMTNu$  problems with blood clotting easily  Endocrine:   '[x]'$  diabetes $RemoveB'[]'IAnYziVl$  thyroid disease  GI $R'[]'Ho$  vomiting blood $RemoveBefore'[]'xlbgnMQzpVcKr$  blood in stool  GU: $Re'[]'MMX$  CKD/renal failure $RemoveBeforeDEI'[]'oxZAigRUXVZjjLnG$  HD'[]'$  M/W/F or $Remove'[]'WhquxjW$  T/T/S $Remo'[]'PbwKC$  burning with urination $RemoveBefore'[]'XDLMYPKfyydNE$  blood in urine  Psychiatric: $RemoveBefor'[]'SExDMzKQssYO$  anxiety $Remove'[x]'CfuCQOt$  depression  Musculoskeletal: $RemoveBeforeDEI'[]'DtnfIABrDvvZEmjE$  arthritis $RemoveBe'[]'qaBkKjcNa$  joint pain  Integumentary: $RemoveBeforeD'[]'hQWwMzNHRfabiM$  rashes $Remov'[]'iCGqCy$  ulcers  Constitutional: $RemoveBeforeDE'[]'WFaCvlwRwmWAsnl$  fever $Remo'[]'PcLyX$  chills   Physical Examination  Vitals:   09/06/19 0439 09/06/19 0745  BP: (!) 142/64   Pulse: 64   Resp: 20   Temp: 98.1 F (36.7 C) 98.7 F (37.1 C)  SpO2: 100%    Body mass index is 20.58 kg/m.  General:  WDWN in NAD Gait: Not observed HENT: WNL, normocephalic Pulmonary: normal non-labored breathing, without Rales, rhonchi,  wheezing Cardiac: regular rate and rhythm  Skin: without rashes Extremities: without ischemic changes, without Gangrene , without cellulitis; without open wounds;  Left UE: palpable thrill, mild ecchymosis over fistula. 2+ radial pulse. Motor, sensation intact Musculoskeletal: no muscle wasting or atrophy  Neurologic: A&O X 3;  No focal weakness or paresthesias are detected; speech is fluent/normal Psychiatric:   The pt has Normal affect.  CBC    Component Value Date/Time   WBC 5.4 09/06/2019 0513   RBC 2.57 (L) 09/06/2019 0513   HGB 7.6 (L) 09/06/2019 0513   HGB 7.2 (L) 07/12/2019 1507   HCT 24.5 (L) 09/06/2019 0513   PLT 96 (L) 09/06/2019 0513   PLT 86 (L) 07/12/2019 1507   MCV 95.3 09/06/2019 0513   MCV 89.6 02/10/2014 1127   MCH 29.6 09/06/2019 0513   MCHC 31.0 09/06/2019 0513   RDW 13.9 09/06/2019 0513   LYMPHSABS 1.5 09/06/2019 0513   MONOABS 0.5 09/06/2019 0513   EOSABS 0.1 09/06/2019 0513   BASOSABS 0.0 09/06/2019 0513    BMET    Component Value Date/Time   NA 137 09/06/2019 0513   K 3.4 (L) 09/06/2019 0513   CL 100 09/06/2019 0513   CO2 26 09/06/2019 0513   GLUCOSE 92 09/06/2019 0513   BUN 30 (H) 09/06/2019 0513   CREATININE 3.50 (H) 09/06/2019 0513   CREATININE 7.33 (HH) 07/12/2019 1507   CREATININE 0.74 02/10/2014 1220   CALCIUM 7.5 (L) 09/06/2019 0513   GFRNONAA 13 (L) 09/06/2019 0513   GFRNONAA 5 (L) 07/12/2019 1507   GFRAA 15 (L) 09/06/2019 0513   GFRAA 6 (L) 07/12/2019 1507    COAGS: Lab Results  Component Value Date   INR 1.5 (H) 09/03/2019   INR 1.3 (H) 09/02/2019   INR 1.2 08/29/2019     ASSESSMENT/PLAN: This is a 69 y.o. female with recent left B-C AVF with hx of CKD.  She had good maturation of her fistula, but required HD before fully matured and TDC placed.  Had significant bleeding from catheter and presented this admission with fever and MSSA bacteremia.  Catheter has been removed and she is receiving cefazolin IV.  Overall improved and she has been afebrile for over 40 hours. Normal WBC count.  -discussed with Dr. Donnetta Hutching, on-call vascular surgeon.  We will plan [facility] placement tomorrow and make her NPO after MN tonight   Risa Grill, PA-C [facility] [phone]  I have examined the patient, reviewed and agree with above.  We will place new [facility] tomorrow.  Left arm AV fistula with excellent thrill.  Infiltration in mid upper  arm.  Discussed at length with the patient this is a very bad prognostic indicator for long-term use of her fistula.  Will continue to watch as she resolves her infiltration.  May need conversion to AV graft.  Certainly this is concerning with her admission for sepsis and would not plan graft during this admission  Curt Jews, MD 09/06/2019 4:21 PM

## 2019-09-06 NOTE — Progress Notes (Addendum)
[place] for Infectious Disease   ADDENDUM 7/14: vascular consultation noted and she will need a TDC placed.  No changes to the plan for 6 weeks of antibiotics with dialysis.    Reason for visit: Follow up on MSSA bacteremia  Interval History: TDC removed.  Hopeful to use fistula going forward.  No acute events. Husband not at bedside.  Late entry note   Physical Exam: Constitutional:  Vitals:   09/06/19 0439 09/06/19 0745  BP: (!) 142/64   Pulse: 64   Resp: 20   Temp: 98.1 F (36.7 C) 98.7 F (37.1 C)  SpO2: 100%    patient appears in NAD Respiratory: Normal respiratory effort; CTA B Cardiovascular: RRR GI: soft, nt, nd  Review of Systems: Constitutional: negative for fevers and chills Gastrointestinal: negative for diarrhea  Lab Results  Component Value Date   WBC 5.4 09/06/2019   HGB 7.6 (L) 09/06/2019   HCT 24.5 (L) 09/06/2019   MCV 95.3 09/06/2019   PLT 96 (L) 09/06/2019    Lab Results  Component Value Date   CREATININE 3.50 (H) 09/06/2019   BUN 30 (H) 09/06/2019   NA 137 09/06/2019   K 3.4 (L) 09/06/2019   CL 100 09/06/2019   CO2 26 09/06/2019    Lab Results  Component Value Date   ALT 21 09/03/2019   AST 24 09/03/2019   ALKPHOS 73 09/03/2019     Microbiology: Recent Results (from the past 240 hour(s))  Culture, blood (Routine x 2)     Status: Abnormal   Collection Time: 09/02/19  4:10 PM   Specimen: BLOOD  Result Value Ref Range Status   Specimen Description BLOOD BLOOD LEFT FOREARM  Final   Special Requests   Final    BOTTLES DRAWN AEROBIC AND ANAEROBIC Blood Culture adequate volume   Culture  Setup Time   Final    GRAM POSITIVE COCCI IN CLUSTERS IN BOTH AEROBIC AND ANAEROBIC BOTTLES CRITICAL VALUE NOTED.  VALUE IS CONSISTENT WITH PREVIOUSLY REPORTED AND CALLED VALUE.    Culture (A)  Final    STAPHYLOCOCCUS AUREUS SUSCEPTIBILITIES PERFORMED ON PREVIOUS CULTURE WITHIN THE LAST 5 DAYS. Performed at [facility], [place] [address] [place], [place] [address]    Report Status 09/06/2019 FINAL  Final  Culture, blood (Routine x 2)     Status: Abnormal   Collection Time: 09/02/19  4:14 PM   Specimen: BLOOD  Result Value Ref Range Status   Specimen Description BLOOD SITE NOT SPECIFIED  Final   Special Requests   Final    BOTTLES DRAWN AEROBIC AND ANAEROBIC Blood Culture results may not be optimal due to an excessive volume of blood received in culture bottles   Culture  Setup Time   Final    GRAM POSITIVE COCCI IN CLUSTERS IN BOTH AEROBIC AND ANAEROBIC BOTTLES CRITICAL RESULT CALLED TO, READ BACK BY AND VERIFIED WITH: PHARMD K. PIERCE [phone] FCP Performed at [facility], [place] [address] [place], [place] [address]    Culture STAPHYLOCOCCUS AUREUS (A)  Final   Report Status 09/05/2019 FINAL  Final   Organism ID, Bacteria STAPHYLOCOCCUS AUREUS  Final      Susceptibility   Staphylococcus aureus - MIC*    CIPROFLOXACIN <=0.5 SENSITIVE Sensitive     ERYTHROMYCIN RESISTANT Resistant     GENTAMICIN <=0.5 SENSITIVE Sensitive     OXACILLIN <=0.25 SENSITIVE Sensitive     TETRACYCLINE <=1 SENSITIVE Sensitive     VANCOMYCIN <=  0.5 SENSITIVE Sensitive     TRIMETH/SULFA <=10 SENSITIVE Sensitive     CLINDAMYCIN RESISTANT Resistant     RIFAMPIN <=0.5 SENSITIVE Sensitive     Inducible Clindamycin POSITIVE Resistant     * STAPHYLOCOCCUS AUREUS  Blood Culture ID Panel (Reflexed)     Status: Abnormal   Collection Time: 09/02/19  4:14 PM  Result Value Ref Range Status   Enterococcus species NOT DETECTED NOT DETECTED Final   Listeria monocytogenes NOT DETECTED NOT DETECTED Final   Staphylococcus species DETECTED (A) NOT DETECTED Final    Comment: CRITICAL RESULT CALLED TO, READ BACK BY AND VERIFIED WITH: PHARMD K. PIERCE [phone] FCP    Staphylococcus aureus (BCID) DETECTED (A) NOT DETECTED Final    Comment: Methicillin (oxacillin) susceptible Staphylococcus aureus (MSSA). Preferred therapy is anti  staphylococcal beta lactam antibiotic (Cefazolin or Nafcillin), unless clinically contraindicated. CRITICAL RESULT CALLED TO, READ BACK BY AND VERIFIED WITH: PHARMD K. PIERCE [phone] FCP    Methicillin resistance NOT DETECTED NOT DETECTED Final   Streptococcus species NOT DETECTED NOT DETECTED Final   Streptococcus agalactiae NOT DETECTED NOT DETECTED Final   Streptococcus pneumoniae NOT DETECTED NOT DETECTED Final   Streptococcus pyogenes NOT DETECTED NOT DETECTED Final   Acinetobacter baumannii NOT DETECTED NOT DETECTED Final   Enterobacteriaceae species NOT DETECTED NOT DETECTED Final   Enterobacter cloacae complex NOT DETECTED NOT DETECTED Final   Escherichia coli NOT DETECTED NOT DETECTED Final   Klebsiella oxytoca NOT DETECTED NOT DETECTED Final   Klebsiella pneumoniae NOT DETECTED NOT DETECTED Final   Proteus species NOT DETECTED NOT DETECTED Final   Serratia marcescens NOT DETECTED NOT DETECTED Final   Haemophilus influenzae NOT DETECTED NOT DETECTED Final   Neisseria meningitidis NOT DETECTED NOT DETECTED Final   Pseudomonas aeruginosa NOT DETECTED NOT DETECTED Final   Candida albicans NOT DETECTED NOT DETECTED Final   Candida glabrata NOT DETECTED NOT DETECTED Final   Candida krusei NOT DETECTED NOT DETECTED Final   Candida parapsilosis NOT DETECTED NOT DETECTED Final   Candida tropicalis NOT DETECTED NOT DETECTED Final    Comment: Performed at [facility], [address] [place], [place] [address]  SARS Coronavirus 2 by RT PCR (hospital order, performed in [facility] hospital lab) Nasopharyngeal Nasopharyngeal Swab     Status: None   Collection Time: 09/02/19  5:19 PM   Specimen: Nasopharyngeal Swab  Result Value Ref Range Status   SARS Coronavirus 2 NEGATIVE NEGATIVE Final    Comment: (NOTE) SARS-CoV-2 target nucleic acids are NOT DETECTED.  The SARS-CoV-2 RNA is generally detectable in upper and lower respiratory specimens during the acute phase of  infection. The lowest concentration of SARS-CoV-2 viral copies this assay can detect is 250 copies / mL. A negative result does not preclude SARS-CoV-2 infection and should not be used as the sole basis for treatment or other patient management decisions.  A negative result may occur with improper specimen collection / handling, submission of specimen other than nasopharyngeal swab, presence of viral mutation(s) within the areas targeted by this assay, and inadequate number of viral copies (<250 copies / mL). A negative result must be combined with clinical observations, patient history, and epidemiological information.  Fact Sheet for Patients:   BoilerBrush.com.cy  Fact Sheet for Healthcare Providers: https://pope.com/  This test is not yet approved or  cleared by the [place] FDA and has been authorized for detection and/or diagnosis of SARS-CoV-2 by FDA under an Emergency Use Authorization (EUA).  This EUA will remain in effect (meaning this test can be used) for the duration of the COVID-19 declaration under Section 564(b)(1) of the Act, 21 U.S.C. section 360bbb-3(b)(1), unless the authorization is terminated or revoked sooner.  Performed at [facility], [place] [address], [place], [place] [address]   Urine culture     Status: Abnormal   Collection Time: 09/03/19  5:58 AM   Specimen: Urine, Random  Result Value Ref Range Status   Specimen Description URINE, RANDOM  Final   Special Requests NONE  Final   Culture (A)  Final    <10,000 COLONIES/mL INSIGNIFICANT GROWTH Performed at [facility], [place] [address], [place], [place] [address]    Report Status 09/04/2019 FINAL  Final  MRSA PCR Screening     Status: None   Collection Time: 09/03/19  5:58 AM   Specimen: Nasopharyngeal  Result Value Ref Range Status   MRSA by PCR NEGATIVE NEGATIVE Final    Comment:        The GeneXpert MRSA Assay (FDA approved for  NASAL specimens only), is one component of a comprehensive MRSA colonization surveillance program. It is not intended to diagnose MRSA infection nor to guide or monitor treatment for MRSA infections. Performed at [facility], [place] [address] [place], [place] [address]   Culture, blood (Routine X 2) w Reflex to ID Panel     Status: None (Preliminary result)   Collection Time: 09/04/19  8:53 AM   Specimen: BLOOD RIGHT HAND  Result Value Ref Range Status   Specimen Description BLOOD RIGHT HAND  Final   Special Requests   Final    BOTTLES DRAWN AEROBIC ONLY Blood Culture adequate volume   Culture   Final    NO GROWTH 2 DAYS Performed at [facility], [place] [address] [place], [place] [address]    Report Status PENDING  Incomplete  Culture, blood (Routine X 2) w Reflex to ID Panel     Status: None (Preliminary result)   Collection Time: 09/04/19  8:53 AM   Specimen: BLOOD RIGHT HAND  Result Value Ref Range Status   Specimen Description BLOOD RIGHT HAND  Final   Special Requests   Final    BOTTLES DRAWN AEROBIC ONLY Blood Culture results may not be optimal due to an inadequate volume of blood received in culture bottles   Culture   Final    NO GROWTH 2 DAYS Performed at [facility], [place] [address] [place], [place] [address]    Report Status PENDING  Incomplete    Impression/Plan:  1. MSSA bacteremia - repeat blood cultures have remained negative.  TTE without concerns.  I think with her new fistula and bacteremia, I favor treatment for 6 weeks regardless of endocarditis/TEE so do not feel TEE absolutely indicated.  Would treat through 8/23 with cefazolin after dialysis.    2.   Dialysis - catheter out and AVF used with dialysis session yesterday.  No issues.    I will sign off, call with any questions or new issues.  Thanks

## 2019-09-06 NOTE — Progress Notes (Signed)
Pharmacy Antibiotic Note  Bailey [name] is a 69 y.o. female admitted on 09/02/2019 with MSSA bacteremia.  Pharmacy has been consulted for Ancef dosing.  TDC removed 7/12 for line holiday. AVF used for dialysis 7/13 but infiltrated, so plan for [facility] placement 7/15. Patient remains afebrile, WBC and LA wnl. Initial blood cultures growing MSSA, repeat cultures pending. TTE unconcerning for endocarditis. ID consult recommends treatment for 6 weeks with cefazolin after dialysis.  Plan: Continue Cefazolin 1g IV every 24 hours.  Follow up HD schedule and change to 2 gm after dialysis.    Height: $Remove'5\' 3"'uZySQus$  (160 cm) Weight: 52.7 kg (116 lb 2.9 oz) IBW/kg (Calculated) : 52.4  Temp (24hrs), Avg:98.4 F (36.9 C), Min:98 F (36.7 C), Max:98.7 F (37.1 C)  Recent Labs  Lab 09/02/19 1614 09/02/19 1614 09/03/19 0340 09/04/19 0853 09/05/19 0348 09/05/19 1157 09/06/19 0513  WBC 7.9  --  7.2 6.8 6.0  --  5.4  CREATININE 1.75*   < > 2.66* 1.97* 3.18* 3.59* 3.50*  LATICACIDVEN 1.7  --   --   --   --   --   --    < > = values in this interval not displayed.    Estimated Creatinine Clearance: 12.4 mL/min (A) (by C-G formula based on SCr of 3.5 mg/dL (H)).    No Known Allergies  Antimicrobials this admission: Vancomycin 7/10 >> 7/11 Cefepime 7/10 >> 7/11 Cefazolin 7/11 >> (8/23)  Dose adjustments this admission:  Microbiology results: 7/12 BCx >> pending 7/10 BCx >> MSSA 7/10 UCx: negative 7/10 MRSA PCR negative 7/10 COVID negative  Thank you for allowing pharmacy to be a part of this patient's care.  Claudina Lick, PharmD PGY1 Acute Care Pharmacy Resident 09/06/2019 11:34 AM  Please check [email or web address] for unit-specific pharmacy phone numbers.

## 2019-09-07 ENCOUNTER — Inpatient Hospital Stay (HOSPITAL_COMMUNITY): Payer: Medicare HMO | Admitting: Certified Registered Nurse Anesthetist

## 2019-09-07 ENCOUNTER — Inpatient Hospital Stay (HOSPITAL_COMMUNITY): Payer: Medicare HMO

## 2019-09-07 ENCOUNTER — Encounter (HOSPITAL_COMMUNITY): Admission: EM | Disposition: A | Discharge status: Home / Self Care | Payer: Self-pay | Attending: Internal Medicine

## 2019-09-07 DIAGNOSIS — N185 Chronic kidney disease, stage 5: Secondary | ICD-10-CM

## 2019-09-07 HISTORY — PX: INSERTION OF DIALYSIS CATHETER: SHX1324

## 2019-09-07 LAB — BASIC METABOLIC PANEL
Anion gap: 11 (ref 5–15)
BUN: 51 mg/dL — ABNORMAL HIGH (ref 8–23)
CO2: 24 mmol/L (ref 22–32)
Calcium: 7.8 mg/dL — ABNORMAL LOW (ref 8.9–10.3)
Chloride: 102 mmol/L (ref 98–111)
Creatinine, Ser: 4.44 mg/dL — ABNORMAL HIGH (ref 0.44–1.00)
GFR calc Af Amer: 11 mL/min — ABNORMAL LOW (ref >60)
GFR calc non Af Amer: 9 mL/min — ABNORMAL LOW (ref >60)
Glucose, Bld: 114 mg/dL — ABNORMAL HIGH (ref 70–99)
Potassium: 4 mmol/L (ref 3.5–5.1)
Sodium: 137 mmol/L (ref 135–145)

## 2019-09-07 LAB — CBC WITH DIFFERENTIAL/PLATELET
Abs Immature Granulocytes: 0.13 10*3/uL — ABNORMAL HIGH (ref 0.00–0.07)
Basophils Absolute: 0 10*3/uL (ref 0.0–0.1)
Basophils Relative: 1 %
Eosinophils Absolute: 0.2 10*3/uL (ref 0.0–0.5)
Eosinophils Relative: 3 %
HCT: 24.5 % — ABNORMAL LOW (ref 36.0–46.0)
Hemoglobin: 7.8 g/dL — ABNORMAL LOW (ref 12.0–15.0)
Immature Granulocytes: 2 %
Lymphocytes Relative: 21 %
Lymphs Abs: 1.4 10*3/uL (ref 0.7–4.0)
MCH: 30.2 pg (ref 26.0–34.0)
MCHC: 31.8 g/dL (ref 30.0–36.0)
MCV: 95 fL (ref 80.0–100.0)
Monocytes Absolute: 0.6 10*3/uL (ref 0.1–1.0)
Monocytes Relative: 9 %
Neutro Abs: 4.2 10*3/uL (ref 1.7–7.7)
Neutrophils Relative %: 64 %
Platelets: 123 10*3/uL — ABNORMAL LOW (ref 150–400)
RBC: 2.58 MIL/uL — ABNORMAL LOW (ref 3.87–5.11)
RDW: 13.7 % (ref 11.5–15.5)
WBC: 6.5 10*3/uL (ref 4.0–10.5)
nRBC: 0 % (ref 0.0–0.2)

## 2019-09-07 LAB — PROCALCITONIN: Procalcitonin: 20.49 ng/mL

## 2019-09-07 LAB — GLUCOSE, CAPILLARY: Glucose-Capillary: 112 mg/dL — ABNORMAL HIGH (ref 70–99)

## 2019-09-07 SURGERY — INSERTION OF DIALYSIS CATHETER
Anesthesia: Monitor Anesthesia Care | Site: Neck | Laterality: Left

## 2019-09-07 MED ORDER — LIDOCAINE 2% (20 MG/ML) 5 ML SYRINGE
INTRAMUSCULAR | Status: DC | PRN
  Administered 2019-09-07: 50 mg via INTRAVENOUS

## 2019-09-07 MED ORDER — FENTANYL CITRATE (PF) 250 MCG/5ML IJ SOLN
INTRAMUSCULAR | Status: AC
  Filled 2019-09-07: qty 5

## 2019-09-07 MED ORDER — PROPOFOL 500 MG/50ML IV EMUL
INTRAVENOUS | Status: DC | PRN
  Administered 2019-09-07: 25 ug/kg/min via INTRAVENOUS

## 2019-09-07 MED ORDER — SODIUM CHLORIDE 0.9 % IV SOLN
INTRAVENOUS | Status: AC
  Filled 2019-09-07: qty 1.2

## 2019-09-07 MED ORDER — 0.9 % SODIUM CHLORIDE (POUR BTL) OPTIME
TOPICAL | Status: DC | PRN
  Administered 2019-09-07: 1000 mL

## 2019-09-07 MED ORDER — OXYCODONE HCL 5 MG/5ML PO SOLN: 5.0000 mg | Freq: Once | ORAL | Status: DC | PRN

## 2019-09-07 MED ORDER — SODIUM CHLORIDE 0.9 % IV SOLN: INTRAVENOUS | Status: DC

## 2019-09-07 MED ORDER — LIDOCAINE-EPINEPHRINE 0.5 %-1:200000 IJ SOLN
INTRAMUSCULAR | Status: AC
  Filled 2019-09-07: qty 1

## 2019-09-07 MED ORDER — HEPARIN SODIUM (PORCINE) 1000 UNIT/ML IJ SOLN
INTRAMUSCULAR | Status: AC
  Filled 2019-09-07: qty 1

## 2019-09-07 MED ORDER — FENTANYL CITRATE (PF) 250 MCG/5ML IJ SOLN
INTRAMUSCULAR | Status: DC | PRN
  Administered 2019-09-07: 50 ug via INTRAVENOUS

## 2019-09-07 MED ORDER — SODIUM CHLORIDE 0.9 % IV SOLN
INTRAVENOUS | Status: DC | PRN
  Administered 2019-09-07: 500 mL

## 2019-09-07 MED ORDER — HEPARIN SODIUM (PORCINE) 1000 UNIT/ML IJ SOLN
INTRAMUSCULAR | Status: AC
  Administered 2019-09-07: 1000 [IU]
  Filled 2019-09-07: qty 4

## 2019-09-07 MED ORDER — FENTANYL CITRATE (PF) 100 MCG/2ML IJ SOLN: 25.0000 ug | INTRAMUSCULAR | Status: DC | PRN

## 2019-09-07 MED ORDER — HEPARIN SODIUM (PORCINE) 1000 UNIT/ML IJ SOLN
INTRAMUSCULAR | Status: DC | PRN
  Administered 2019-09-07: 1000 [IU]

## 2019-09-07 MED ORDER — OXYCODONE HCL 5 MG PO TABS: 5.0000 mg | ORAL_TABLET | Freq: Once | ORAL | Status: DC | PRN

## 2019-09-07 MED ORDER — ONDANSETRON HCL 4 MG/2ML IJ SOLN: 4.0000 mg | Freq: Once | INTRAMUSCULAR | Status: DC | PRN

## 2019-09-07 MED ORDER — ORAL CARE MOUTH RINSE: 15.0000 mL | Freq: Once | OROMUCOSAL | Status: AC

## 2019-09-07 MED ORDER — LIDOCAINE-EPINEPHRINE 0.5 %-1:200000 IJ SOLN
INTRAMUSCULAR | Status: DC | PRN
  Administered 2019-09-07: 50 mL

## 2019-09-07 MED ORDER — MIDAZOLAM HCL 5 MG/5ML IJ SOLN
INTRAMUSCULAR | Status: DC | PRN
  Administered 2019-09-07: 1 mg via INTRAVENOUS

## 2019-09-07 MED ORDER — MIDAZOLAM HCL 2 MG/2ML IJ SOLN
INTRAMUSCULAR | Status: AC
  Filled 2019-09-07: qty 2

## 2019-09-07 MED ORDER — DOXERCALCIFEROL 4 MCG/2ML IV SOLN
INTRAVENOUS | Status: AC
  Filled 2019-09-07: qty 2

## 2019-09-07 MED ORDER — CHLORHEXIDINE GLUCONATE 0.12 % MT SOLN: 15.0000 mL | Freq: Once | OROMUCOSAL | Status: AC

## 2019-09-07 MED ORDER — CHLORHEXIDINE GLUCONATE 0.12 % MT SOLN
OROMUCOSAL | Status: AC
  Administered 2019-09-07: 15 mL via OROMUCOSAL
  Filled 2019-09-07: qty 15

## 2019-09-07 SURGICAL SUPPLY — 49 items
BAG DECANTER FOR FLEXI CONT (MISCELLANEOUS) ×3 IMPLANT
BIOPATCH RED 1 DISK 7.0 (GAUZE/BANDAGES/DRESSINGS) ×2 IMPLANT
BIOPATCH RED 1IN DISK 7.0MM (GAUZE/BANDAGES/DRESSINGS) ×1
CATH PALINDROME-P 19CM W/VT (CATHETERS) IMPLANT
CATH PALINDROME-P 23CM W/VT (CATHETERS) ×2 IMPLANT
CATH PALINDROME-P 28CM W/VT (CATHETERS) IMPLANT
CATH STRAIGHT 5FR 65CM (CATHETERS) IMPLANT
COVER PROBE W GEL 5X96 (DRAPES) ×3 IMPLANT
COVER SURGICAL LIGHT HANDLE (MISCELLANEOUS) ×3 IMPLANT
COVER WAND RF STERILE (DRAPES) ×1 IMPLANT
DECANTER SPIKE VIAL GLASS SM (MISCELLANEOUS) ×3 IMPLANT
DERMABOND ADVANCED (GAUZE/BANDAGES/DRESSINGS) ×2
DERMABOND ADVANCED .7 DNX12 (GAUZE/BANDAGES/DRESSINGS) ×1 IMPLANT
DRAPE C-ARM 42X72 X-RAY (DRAPES) ×3 IMPLANT
DRAPE CHEST BREAST 15X10 FENES (DRAPES) ×3 IMPLANT
DRSG COVADERM PLUS 2X2 (GAUZE/BANDAGES/DRESSINGS) ×2 IMPLANT
GAUZE 4X4 16PLY RFD (DISPOSABLE) ×3 IMPLANT
GLOVE BIO SURGEON STRL SZ7.5 (GLOVE) ×3 IMPLANT
GLOVE BIOGEL PI IND STRL 6.5 (GLOVE) IMPLANT
GLOVE BIOGEL PI IND STRL 8 (GLOVE) ×1 IMPLANT
GLOVE BIOGEL PI INDICATOR 6.5 (GLOVE) ×6
GLOVE BIOGEL PI INDICATOR 8 (GLOVE) ×2
GOWN STRL NON-REIN LRG LVL3 (GOWN DISPOSABLE) ×4 IMPLANT
GOWN STRL REUS W/ TWL LRG LVL3 (GOWN DISPOSABLE) ×2 IMPLANT
GOWN STRL REUS W/ TWL XL LVL3 (GOWN DISPOSABLE) ×2 IMPLANT
GOWN STRL REUS W/TWL LRG LVL3 (GOWN DISPOSABLE) ×4
GOWN STRL REUS W/TWL XL LVL3 (GOWN DISPOSABLE) ×4
KIT BASIN OR (CUSTOM PROCEDURE TRAY) ×3 IMPLANT
KIT PALINDROME-P 55CM (CATHETERS) IMPLANT
KIT TURNOVER KIT B (KITS) ×3 IMPLANT
NDL 18GX1X1/2 (RX/OR ONLY) (NEEDLE) ×1 IMPLANT
NDL HYPO 25GX1X1/2 BEV (NEEDLE) ×1 IMPLANT
NEEDLE 18GX1X1/2 (RX/OR ONLY) (NEEDLE) ×3 IMPLANT
NEEDLE HYPO 25GX1X1/2 BEV (NEEDLE) ×3 IMPLANT
NS IRRIG 1000ML POUR BTL (IV SOLUTION) ×3 IMPLANT
PACK SURGICAL SETUP 50X90 (CUSTOM PROCEDURE TRAY) ×3 IMPLANT
PAD ARMBOARD 7.5X6 YLW CONV (MISCELLANEOUS) ×6 IMPLANT
SET MICROPUNCTURE 5F STIFF (MISCELLANEOUS) IMPLANT
SOAP 2 % CHG 4 OZ (WOUND CARE) ×3 IMPLANT
SUT ETHILON 3 0 PS 1 (SUTURE) ×3 IMPLANT
SUT MNCRL AB 4-0 PS2 18 (SUTURE) ×3 IMPLANT
SYR 10ML LL (SYRINGE) ×3 IMPLANT
SYR 20ML LL LF (SYRINGE) ×6 IMPLANT
SYR 5ML LL (SYRINGE) ×3 IMPLANT
SYR CONTROL 10ML LL (SYRINGE) ×3 IMPLANT
TOWEL GREEN STERILE (TOWEL DISPOSABLE) ×3 IMPLANT
TOWEL GREEN STERILE FF (TOWEL DISPOSABLE) ×3 IMPLANT
WATER STERILE IRR 1000ML POUR (IV SOLUTION) ×3 IMPLANT
WIRE AMPLATZ SS-J .035X180CM (WIRE) IMPLANT

## 2019-09-07 NOTE — Interval H&P Note (Signed)
History and Physical Interval Note:  09/07/2019 10:17 AM  Bailey [name]  has presented today for surgery, with the diagnosis of END STAGE RENAL DISEASE.  The various methods of treatment have been discussed with the patient and family. After consideration of risks, benefits and other options for treatment, the patient has consented to  Procedure(s): INSERTION OF DIALYSIS CATHETER (N/A) as a surgical intervention.  The patient's history has been reviewed, patient examined, no change in status, stable for surgery.  I have reviewed the patient's chart and labs.  Questions were answered to the patient's satisfaction.     Curt Jews

## 2019-09-07 NOTE — Anesthesia Preprocedure Evaluation (Addendum)
Anesthesia Evaluation  Patient identified by MRN, date of birth, ID band Patient awake    Reviewed: Allergy & Precautions, NPO status , Patient's Chart, lab work & pertinent test results  Airway Mallampati: II  TM Distance: >3 FB Neck ROM: Full    Dental  (+) Teeth Intact, Dental Advisory Given   Pulmonary    breath sounds clear to auscultation       Cardiovascular hypertension,  Rhythm:Regular Rate:Normal     Neuro/Psych    GI/Hepatic   Endo/Other  diabetes  Renal/GU      Musculoskeletal   Abdominal   Peds  Hematology   Anesthesia Other Findings   Reproductive/Obstetrics                            Anesthesia Physical Anesthesia Plan  ASA: III  Anesthesia Plan: MAC   Post-op Pain Management:    Induction: Intravenous  PONV Risk Score and Plan:   Airway Management Planned:   Additional Equipment:   Intra-op Plan:   Post-operative Plan:   Informed Consent: I have reviewed the patients History and Physical, chart, labs and discussed the procedure including the risks, benefits and alternatives for the proposed anesthesia with the patient or authorized representative who has indicated his/her understanding and acceptance.       Plan Discussed with: CRNA and Anesthesiologist  Anesthesia Plan Comments:         Anesthesia Quick Evaluation

## 2019-09-07 NOTE — Anesthesia Procedure Notes (Signed)
Procedure Name: MAC Date/Time: 09/07/2019 10:30 AM Performed by: Mariea Clonts, CRNA Pre-anesthesia Checklist: Patient identified, Emergency Drugs available, Suction available, Patient being monitored and Timeout performed Patient Re-evaluated:Patient Re-evaluated prior to induction Oxygen Delivery Method: Simple face mask and Nasal cannula

## 2019-09-07 NOTE — Progress Notes (Signed)
[facility] Progress Note   Subjective: Delightfully funny and happy post TDC insertion. Says"that was the best procedure I've ever had". No C/Os. New [facility]I. HD later this afternoon after she eats.     Objective Vitals:   09/07/19 0509 09/07/19 0732 09/07/19 1120 09/07/19 1135  BP: (!) 146/64 (!) 146/66 (!) 122/53 (!) 130/49  Pulse: 70 68 65 64  Resp: $Remo'19 19 18 'uZpaY$ (!) 21  Temp: 98.3 F (36.8 C) 98.1 F (36.7 C) 98.3 F (36.8 C) 98.4 F (36.9 C)  TempSrc: Oral Oral    SpO2: 98% 98% 96% 96%  Weight: 53.8 kg     Height:       Physical Exam General:pale, chronically ill appearing pt in NAD Heart:S1,S2, RRR SR on monitor, rate 60s. Lungs:Decreased in bases otherwise CTAB. No WOB. Abdomen:S, NT Active BS Extremities:Trace BLE edema Dialysis Access:L AVF with T/B-bruit bruised, decreased swelling noted from yesterday. New [facility] CDI.   Additional Objective Labs: Basic Metabolic Panel: Recent Labs  Lab 09/02/19 1811 09/03/19 0340 09/05/19 1157 09/06/19 0513 09/07/19 0819  NA  --    < > 139 137 137  K  --    < > 3.3* 3.4* 4.0  CL  --    < > 99 100 102  CO2  --    < > $R'30 26 24  'rL$ GLUCOSE  --    < > 116* 92 114*  BUN  --    < > 29* 30* 51*  CREATININE  --    < > 3.59* 3.50* 4.44*  CALCIUM  --    < > 7.5* 7.5* 7.8*  PHOS 2.5  --  2.2*  --   --    < > = values in this interval not displayed.   Liver Function Tests: Recent Labs  Lab 09/02/19 1614 09/03/19 0340 09/05/19 1157  AST 22 24  --   ALT 17 21  --   ALKPHOS 72 73  --   BILITOT 0.5 0.9  --   PROT 5.6* 5.6*  --   ALBUMIN 2.5* 2.5* 1.9*   No results for input(s): LIPASE, AMYLASE in the last 168 hours. CBC: Recent Labs  Lab 09/03/19 0340 09/03/19 0340 09/04/19 0853 09/04/19 0853 09/05/19 0348 09/06/19 0513 09/07/19 0819  WBC 7.2   < > 6.8   < > 6.0 5.4 6.5  NEUTROABS  --   --  5.6   < > 4.2 3.2 4.2  HGB 8.5*   < > 8.3*   < > 7.3* 7.6* 7.8*  HCT 26.8*   < > 26.6*    < > 23.3* 24.5* 24.5*  MCV 94.7  --  94.0  --  95.1 95.3 95.0  PLT 80*   < > PLATELET CLUMPS NOTED ON SMEAR, COUNT APPEARS DECREASED   < > 91* 96* 123*   < > = values in this interval not displayed.   Blood Culture    Component Value Date/Time   SDES BLOOD RIGHT HAND 09/04/2019 0853   SDES BLOOD RIGHT HAND 09/04/2019 0853   SPECREQUEST  09/04/2019 0853    BOTTLES DRAWN AEROBIC ONLY Blood Culture adequate volume   SPECREQUEST  09/04/2019 0853    BOTTLES DRAWN AEROBIC ONLY Blood Culture results may not be optimal due to an inadequate volume of blood received in culture bottles   CULT  09/04/2019 0853    NO GROWTH 2 DAYS Performed at [facility], [place]  [address], [place], [place] [address]    CULT  09/04/2019 0853    NO GROWTH 2 DAYS Performed at [facility], [place] [address] [place], [place] [address]    REPTSTATUS PENDING 09/04/2019 0853   REPTSTATUS PENDING 09/04/2019 0853    Cardiac Enzymes: No results for input(s): CKTOTAL, CKMB, CKMBINDEX, TROPONINI in the last 168 hours. CBG: Recent Labs  Lab 09/02/19 2130 09/03/19 0820 09/03/19 1158 09/03/19 1609 09/03/19 2034  GLUCAP 141* 131* 150* 158* 188*   Iron Studies: No results for input(s): IRON, TIBC, TRANSFERRIN, FERRITIN in the last 72 hours. $RemoveB'@lablastinr3'sbzuMYOE$ @ Studies/Results: DG Fluoro Guide CV Line-No Report  Result Date: 09/07/2019 Fluoroscopy was utilized by the requesting physician.  No radiographic interpretation.   Medications: .  ceFAZolin (ANCEF) IV 1 g (09/06/19 1554)   . carvedilol  3.125 mg Oral BID WC  . Chlorhexidine Gluconate Cloth  6 each Topical Daily  . Chlorhexidine Gluconate Cloth  6 each Topical Q0600  . cyanocobalamin  1,000 mcg Intramuscular Q30 days  . darbepoetin (ARANESP) injection - DIALYSIS  100 mcg Intravenous Q Tue-HD  . doxercalciferol  2 mcg Intravenous Q T,Th,Sa-HD  . feeding supplement (ENSURE ENLIVE)  237 mL Oral TID BM  . multivitamin  1 tablet Oral QHS     AHD  orders:TTS -[place] 3.5hrs,400/60056kg,2K/2.25Ca Access:L AVF Heparinnone Mircera84mcgIVq2wks - last 08/19/19 Venofer $RemoveBeforeD'50mg'ixZSAWcAtZeymi$ IVqwk Hectorol29mcg IV qHD   Assessment/Plan: 1. MSSA bacteremia- Afebrilelast 24hrs. BC +MSSA. On Ancef-need to get information on how long ID wants to continue as OP. Etiology unclear, most likely catheter related.Damar removed 09/04/2019. New TDC placed today. [facility] 09/04/2019 NGTD. Continue Ancef post HD through 10/16/2019.  2. Volume overload- CXR with vascular congestion/pulm edemaon adm. Resolved with HD. Continue lowering volume as tolerated in HD. 3. Confusion: Unclear if this is pre-existing issue or related to current sepsis. Seems much improved today. Per primary. 4. ESRD- New start on HD TTS.Next HD 07/15. Has  Used AVF 07/13 for 1st time and infiltrated X 2. Seen by [facility], concerned that early infiltration is poor prognostic indicator for long term use of AVF. May need to be converted to AVG which will not be done in current setting of sepsis.  5. Hypertension-BP controlled today.No overt excess volume. Continue homeBPmeds.  6. Anemiaof CKD- Hgb initially 5.8 in ED, improved to 8.5 s/p 2unitsPRBC. Now HGB falling again. Rec'd Aranesp 100 mcg IV 09/05/2019. Follow HGB. Transfuse as needed.  7. Secondary Hyperparathyroidism -PO4 low, C Ca OK. Continue VDRA. Not on binders. 8. Nutrition- Albumin , K+ and PO4 low. Change to regular diet with fluid restrictions. Continue protein supplements.  9.    DMT2 - per primary  Geneveive Furness H. Kelle Ruppert NP-C 09/07/2019, 11:57 AM  [employer] [phone]

## 2019-09-07 NOTE — Progress Notes (Signed)
PROGRESS NOTE    Bailey [name]  TRZ:[id number] DOB: [birth year] DOA: 09/02/2019 PCP: [name], MD   Brief Narrative:  Patient is a 69 year old female with history of ESRD on dialysis, type 2 diabetes mellitus, anemia of chronic disease, right foot osteomyelitis, vitamin B12 deficiency, hypertension who presented with generalized weakness.  On presentation she was febrile, hemoglobin of 5.8 but no leukocytosis.  She was transfused in the emergency department but developed acute respite distress that night after starting blood transfusion with suspicion for TACO.  Blood cultures showed MSSA.  Started on Ancef.  ID consulted today: 09/04/2019 did not show any evidence of vegetation.  Repeat blood cultures have been negative.  ID recommended to continue antibiotics for total of 6 weeks.  Vascular surgery planning to do temporary dialysis catheter today.  Nephrology following for dialysis.  Assessment & Plan:   Principal Problem:   Symptomatic anemia Active Problems:   ESRD on dialysis (HCC)   Benign essential HTN   Thrombocytopenia (HCC)   Hypokalemia   Fever of unknown origin   Malnutrition of moderate degree  Septicemia/MSSA bacteremia: Presented with generalized weakness, fever.  Suspected to be catheter related.  Hemodialysis catheter removed on 09/04/2019 by IR.  Repeat cultures have been negative.  2D echo did not show any valvular vegetation.  Plan to continue his Ancef for total of 6 weeks.  ID was following.  Last day of antibiotic will be 8/23.  Currently she is hemodynamically stable.  Normocytic anemia: Most likely associated with ESRD.  Currently hemoglobin in the range of 7.  She was given a unit of PRBC during this hospitalization.    Transfusion associated circulatory overload/acute hypoxic respiratory failure :there was suspicion for transfusion associated circulatory overload.  Chest x-ray showed increasing pulmonary vascular consistent with  pulmonary edema.  Received  diuretics.  Volume being managed by dialysis.  Currently on room air.  ESRD: Being dialyzed on TTS schedule.  Nephrology following.  She underwent AV fistula placement on left upper arm by vascular surgery, evidently is not working.  Plan is to place temporary dialysis catheter today.  Hypokalemia: Being monitored and supplemented.  Hypertension: On Coreg.  Nifedipine on hold.  Chronic anxiety/depression: Continue home medications  Thrombocytopenia: Chronic.  Continue to monitor  Diabetes type 2: Diet controlled.  Last hemoglobin C 5.5.  Generalized weakness: Likely associated with sepsis.  PT recommended home with home health on discharge.  Nutrition Problem: Moderate Malnutrition Etiology: chronic illness (DM, ESRD)      DVT prophylaxis:SCD Code Status: Full Family Communication: Called son on phone on 09/07/19. Called husband,phone not received Status is: Inpatient  Remains inpatient appropriate because:Ongoing diagnostic testing needed not appropriate for outpatient work up   Dispo:  Patient From: Home  Planned Disposition:home with home health  Expected discharge date: 09/08/19  Medically stable for discharge: No     Consultants: ID,nephro,vascular surgery   Antimicrobials:  Anti-infectives (From admission, onward)   Start     Dose/Rate Route Frequency Ordered Stop   09/03/19 2200  vancomycin (VANCOREADY) IVPB 500 mg/100 mL  Status:  Discontinued        500 mg 100 mL/hr over 60 Minutes Intravenous Every 24 hours 09/02/19 1915 09/03/19 0925   09/03/19 2000  ceFEPIme (MAXIPIME) 1 g in sodium chloride 0.9 % 100 mL IVPB  Status:  Discontinued        1 g 200 mL/hr over 30 Minutes Intravenous Every 24 hours 09/03/19 0936 09/03/19 1147   09/03/19 1300  ceFAZolin (ANCEF) IVPB 1 g/50 mL premix     Discontinue     1 g 100 mL/hr over 30 Minutes Intravenous Every 24 hours 09/03/19 1246     09/02/19 2100  vancomycin (VANCOREADY) IVPB 1250 mg/250 mL        1,250 mg 166.7  mL/hr over 90 Minutes Intravenous  Once 09/02/19 1915 09/02/19 2314   09/02/19 2000  ceFEPIme (MAXIPIME) 2 g in sodium chloride 0.9 % 100 mL IVPB  Status:  Discontinued        2 g 200 mL/hr over 30 Minutes Intravenous Every 24 hours 09/02/19 1915 09/03/19 0936      Subjective: Patient seen and examined at the bedside this morning.  Hemodynamically stable.  Waiting for temporary dialysis catheter placement.No new issues  Objective: Vitals:   09/06/19 2016 09/07/19 0028 09/07/19 0509 09/07/19 0732  BP: (!) 172/65 (!) 148/83 (!) 146/64 (!) 146/66  Pulse: 76 73 70 68  Resp: (!) $RemoveB'21 20 19 19  'rSlGeTSj$ Temp: 98.6 F (37 C) 99 F (37.2 C) 98.3 F (36.8 C) 98.1 F (36.7 C)  TempSrc: Oral Oral Oral Oral  SpO2: 96% 96% 98% 98%  Weight:   53.8 kg   Height:        Intake/Output Summary (Last 24 hours) at 09/07/2019 0810 Last data filed at 09/06/2019 2215 Gross per 24 hour  Intake 360 ml  Output --  Net 360 ml   Filed Weights   09/05/19 1515 09/06/19 0439 09/07/19 0509  Weight: 53.9 kg 52.7 kg 53.8 kg    Examination:   General exam: Pleasant female, lying on bed HEENT:PERRL,Oral mucosa moist, Ear/Nose normal on gross exam Respiratory system: Bilateral equal air entry, normal vesicular breath sounds, no wheezes or crackles  Cardiovascular system: S1 & S2 heard, RRR. No JVD, murmurs, rubs, gallops or clicks. Gastrointestinal system: Abdomen is nondistended, soft and nontender. No organomegaly or masses felt. Normal bowel sounds heard. Central nervous system: Alert and oriented. No focal neurological deficits. Extremities: No edema, no clubbing ,no cyanosis, distal peripheral pulses palpable. Skin: No rashes, lesions or ulcers,no icterus ,no pallor, AV fistula on the left arm    Data Reviewed: I have personally reviewed following labs and imaging studies  CBC: Recent Labs  Lab 09/02/19 1614 09/03/19 0340 09/04/19 0853 09/05/19 0348 09/06/19 0513  WBC 7.9 7.2 6.8 6.0 5.4  NEUTROABS  7.0  --  5.6 4.2 3.2  HGB 5.8* 8.5* 8.3* 7.3* 7.6*  HCT 19.1* 26.8* 26.6* 23.3* 24.5*  MCV 95.5 94.7 94.0 95.1 95.3  PLT PLATELET CLUMPS NOTED ON SMEAR, COUNT APPEARS DECREASED 80* PLATELET CLUMPS NOTED ON SMEAR, COUNT APPEARS DECREASED 91* 96*   Basic Metabolic Panel: Recent Labs  Lab 09/02/19 1614 09/02/19 1811 09/03/19 0340 09/04/19 0853 09/05/19 0348 09/05/19 1157 09/06/19 0513  NA   < >  --  138 139 138 139 137  K   < >  --  3.7 3.0* 2.8* 3.3* 3.4*  CL   < >  --  97* 101 100 99 100  CO2   < >  --  $R'28 27 28 30 26  'om$ GLUCOSE   < >  --  192* 115* 102* 116* 92  BUN   < >  --  15 11 25* 29* 30*  CREATININE   < >  --  2.66* 1.97* 3.18* 3.59* 3.50*  CALCIUM   < >  --  8.2* 7.9* 7.5* 7.5* 7.5*  MG  --  1.7  --   --   --   --   --  PHOS  --  2.5  --   --   --  2.2*  --    < > = values in this interval not displayed.   GFR: Estimated Creatinine Clearance: 12.4 mL/min (A) (by C-G formula based on SCr of 3.5 mg/dL (H)). Liver Function Tests: Recent Labs  Lab 09/02/19 1614 09/03/19 0340 09/05/19 1157  AST 22 24  --   ALT 17 21  --   ALKPHOS 72 73  --   BILITOT 0.5 0.9  --   PROT 5.6* 5.6*  --   ALBUMIN 2.5* 2.5* 1.9*   No results for input(s): LIPASE, AMYLASE in the last 168 hours. No results for input(s): AMMONIA in the last 168 hours. Coagulation Profile: Recent Labs  Lab 09/02/19 1614 09/03/19 0340  INR 1.3* 1.5*   Cardiac Enzymes: No results for input(s): CKTOTAL, CKMB, CKMBINDEX, TROPONINI in the last 168 hours. BNP (last 3 results) No results for input(s): PROBNP in the last 8760 hours. HbA1C: No results for input(s): HGBA1C in the last 72 hours. CBG: Recent Labs  Lab 09/02/19 2130 09/03/19 0820 09/03/19 1158 09/03/19 1609 09/03/19 2034  GLUCAP 141* 131* 150* 158* 188*   Lipid Profile: No results for input(s): CHOL, HDL, LDLCALC, TRIG, CHOLHDL, LDLDIRECT in the last 72 hours. Thyroid Function Tests: No results for input(s): TSH, T4TOTAL, FREET4,  T3FREE, THYROIDAB in the last 72 hours. Anemia Panel: Recent Labs    09/04/19 1229  VITAMINB12 755   Sepsis Labs: Recent Labs  Lab 09/02/19 1614 09/03/19 0340 09/04/19 0853 09/05/19 0348 09/06/19 0513  PROCALCITON  --  25.69 52.51 49.80 35.66  LATICACIDVEN 1.7  --   --   --   --     Recent Results (from the past 240 hour(s))  Culture, blood (Routine x 2)     Status: Abnormal   Collection Time: 09/02/19  4:10 PM   Specimen: BLOOD  Result Value Ref Range Status   Specimen Description BLOOD BLOOD LEFT FOREARM  Final   Special Requests   Final    BOTTLES DRAWN AEROBIC AND ANAEROBIC Blood Culture adequate volume   Culture  Setup Time   Final    GRAM POSITIVE COCCI IN CLUSTERS IN BOTH AEROBIC AND ANAEROBIC BOTTLES CRITICAL VALUE NOTED.  VALUE IS CONSISTENT WITH PREVIOUSLY REPORTED AND CALLED VALUE.    Culture (A)  Final    STAPHYLOCOCCUS AUREUS SUSCEPTIBILITIES PERFORMED ON PREVIOUS CULTURE WITHIN THE LAST 5 DAYS. Performed at [facility], [address], [place], [place] [address]    Report Status 09/06/2019 FINAL  Final  Culture, blood (Routine x 2)     Status: Abnormal   Collection Time: 09/02/19  4:14 PM   Specimen: BLOOD  Result Value Ref Range Status   Specimen Description BLOOD SITE NOT SPECIFIED  Final   Special Requests   Final    BOTTLES DRAWN AEROBIC AND ANAEROBIC Blood Culture results may not be optimal due to an excessive volume of blood received in culture bottles   Culture  Setup Time   Final    GRAM POSITIVE COCCI IN CLUSTERS IN BOTH AEROBIC AND ANAEROBIC BOTTLES CRITICAL RESULT CALLED TO, READ BACK BY AND VERIFIED WITH: PHARMD K. PIERCE [phone] FCP Performed at [facility], [address] [place], [place] [address]    Culture STAPHYLOCOCCUS AUREUS (A)  Final   Report Status 09/05/2019 FINAL  Final   Organism ID, Bacteria STAPHYLOCOCCUS AUREUS  Final      Susceptibility  Staphylococcus aureus - MIC*    CIPROFLOXACIN <=0.5 SENSITIVE  Sensitive     ERYTHROMYCIN RESISTANT Resistant     GENTAMICIN <=0.5 SENSITIVE Sensitive     OXACILLIN <=0.25 SENSITIVE Sensitive     TETRACYCLINE <=1 SENSITIVE Sensitive     VANCOMYCIN <=0.5 SENSITIVE Sensitive     TRIMETH/SULFA <=10 SENSITIVE Sensitive     CLINDAMYCIN RESISTANT Resistant     RIFAMPIN <=0.5 SENSITIVE Sensitive     Inducible Clindamycin POSITIVE Resistant     * STAPHYLOCOCCUS AUREUS  Blood Culture ID Panel (Reflexed)     Status: Abnormal   Collection Time: 09/02/19  4:14 PM  Result Value Ref Range Status   Enterococcus species NOT DETECTED NOT DETECTED Final   Listeria monocytogenes NOT DETECTED NOT DETECTED Final   Staphylococcus species DETECTED (A) NOT DETECTED Final    Comment: CRITICAL RESULT CALLED TO, READ BACK BY AND VERIFIED WITH: PHARMD K. PIERCE [phone] FCP    Staphylococcus aureus (BCID) DETECTED (A) NOT DETECTED Final    Comment: Methicillin (oxacillin) susceptible Staphylococcus aureus (MSSA). Preferred therapy is anti staphylococcal beta lactam antibiotic (Cefazolin or Nafcillin), unless clinically contraindicated. CRITICAL RESULT CALLED TO, READ BACK BY AND VERIFIED WITH: PHARMD K. PIERCE [phone] FCP    Methicillin resistance NOT DETECTED NOT DETECTED Final   Streptococcus species NOT DETECTED NOT DETECTED Final   Streptococcus agalactiae NOT DETECTED NOT DETECTED Final   Streptococcus pneumoniae NOT DETECTED NOT DETECTED Final   Streptococcus pyogenes NOT DETECTED NOT DETECTED Final   Acinetobacter baumannii NOT DETECTED NOT DETECTED Final   Enterobacteriaceae species NOT DETECTED NOT DETECTED Final   Enterobacter cloacae complex NOT DETECTED NOT DETECTED Final   Escherichia coli NOT DETECTED NOT DETECTED Final   Klebsiella oxytoca NOT DETECTED NOT DETECTED Final   Klebsiella pneumoniae NOT DETECTED NOT DETECTED Final   Proteus species NOT DETECTED NOT DETECTED Final   Serratia marcescens NOT DETECTED NOT DETECTED Final   Haemophilus  influenzae NOT DETECTED NOT DETECTED Final   Neisseria meningitidis NOT DETECTED NOT DETECTED Final   Pseudomonas aeruginosa NOT DETECTED NOT DETECTED Final   Candida albicans NOT DETECTED NOT DETECTED Final   Candida glabrata NOT DETECTED NOT DETECTED Final   Candida krusei NOT DETECTED NOT DETECTED Final   Candida parapsilosis NOT DETECTED NOT DETECTED Final   Candida tropicalis NOT DETECTED NOT DETECTED Final    Comment: Performed at [facility], [place] [address], [place], [place] [address]  SARS Coronavirus 2 by RT PCR (hospital order, performed in [facility] hospital lab) Nasopharyngeal Nasopharyngeal Swab     Status: None   Collection Time: 09/02/19  5:19 PM   Specimen: Nasopharyngeal Swab  Result Value Ref Range Status   SARS Coronavirus 2 NEGATIVE NEGATIVE Final    Comment: (NOTE) SARS-CoV-2 target nucleic acids are NOT DETECTED.  The SARS-CoV-2 RNA is generally detectable in upper and lower respiratory specimens during the acute phase of infection. The lowest concentration of SARS-CoV-2 viral copies this assay can detect is 250 copies / mL. A negative result does not preclude SARS-CoV-2 infection and should not be used as the sole basis for treatment or other patient management decisions.  A negative result may occur with improper specimen collection / handling, submission of specimen other than nasopharyngeal swab, presence of viral mutation(s) within the areas targeted by this assay, and inadequate number of viral copies (<250 copies / mL). A negative result must be combined with clinical observations, patient history, and epidemiological information.  Fact  Sheet for Patients:   StrictlyIdeas.no  Fact Sheet for Healthcare Providers: BankingDealers.co.za  This test is not yet approved or  cleared by the [place] FDA and has been authorized for detection and/or diagnosis of SARS-CoV-2 by FDA under an Emergency Use  Authorization (EUA).  This EUA will remain in effect (meaning this test can be used) for the duration of the COVID-19 declaration under Section 564(b)(1) of the Act, 21 U.S.C. section 360bbb-3(b)(1), unless the authorization is terminated or revoked sooner.  Performed at [facility], [place] [address], [place], [place] [address]   Urine culture     Status: Abnormal   Collection Time: 09/03/19  5:58 AM   Specimen: Urine, Random  Result Value Ref Range Status   Specimen Description URINE, RANDOM  Final   Special Requests NONE  Final   Culture (A)  Final    <10,000 COLONIES/mL INSIGNIFICANT GROWTH Performed at [facility], [place] [address], [place], [place] [address]    Report Status 09/04/2019 FINAL  Final  MRSA PCR Screening     Status: None   Collection Time: 09/03/19  5:58 AM   Specimen: Nasopharyngeal  Result Value Ref Range Status   MRSA by PCR NEGATIVE NEGATIVE Final    Comment:        The GeneXpert MRSA Assay (FDA approved for NASAL specimens only), is one component of a comprehensive MRSA colonization surveillance program. It is not intended to diagnose MRSA infection nor to guide or monitor treatment for MRSA infections. Performed at [facility], [place] [address], [place], [place] [address]   Culture, blood (Routine X 2) w Reflex to ID Panel     Status: None (Preliminary result)   Collection Time: 09/04/19  8:53 AM   Specimen: BLOOD RIGHT HAND  Result Value Ref Range Status   Specimen Description BLOOD RIGHT HAND  Final   Special Requests   Final    BOTTLES DRAWN AEROBIC ONLY Blood Culture adequate volume   Culture   Final    NO GROWTH 2 DAYS Performed at [facility], [place] [address] [place], [place] [address]    Report Status PENDING  Incomplete  Culture, blood (Routine X 2) w Reflex to ID Panel     Status: None (Preliminary result)   Collection Time: 09/04/19  8:53 AM   Specimen: BLOOD RIGHT HAND  Result Value Ref Range Status    Specimen Description BLOOD RIGHT HAND  Final   Special Requests   Final    BOTTLES DRAWN AEROBIC ONLY Blood Culture results may not be optimal due to an inadequate volume of blood received in culture bottles   Culture   Final    NO GROWTH 2 DAYS Performed at [facility], [place] [address], [place],  [address]    Report Status PENDING  Incomplete         Radiology Studies: No results found.      Scheduled Meds: . carvedilol  3.125 mg Oral BID WC  . Chlorhexidine Gluconate Cloth  6 each Topical Daily  . Chlorhexidine Gluconate Cloth  6 each Topical Q0600  . cyanocobalamin  1,000 mcg Intramuscular Q30 days  . darbepoetin (ARANESP) injection - DIALYSIS  100 mcg Intravenous Q Tue-HD  . doxercalciferol  2 mcg Intravenous Q T,Th,Sa-HD  . feeding supplement (ENSURE ENLIVE)  237 mL Oral TID BM  . multivitamin  1 tablet Oral QHS   Continuous Infusions: .  ceFAZolin (ANCEF) IV 1  g (09/06/19 1554)     LOS: 5 days    Time spent:25 mins. More than 50% of that time was spent in counseling and/or coordination of care.      Shelly Coss, MD [facility] P7/15/2021, 8:10 AM

## 2019-09-07 NOTE — Anesthesia Postprocedure Evaluation (Signed)
Anesthesia Post Note  Patient: Bailey [name]  Procedure(s) Performed: INSERTION OF LEFT INTERNAL JUGULAR DIALYSIS CATHETER (Left Neck)     Patient location during evaluation: PACU Anesthesia Type: MAC Level of consciousness: awake and alert Pain management: pain level controlled Vital Signs Assessment: post-procedure vital signs reviewed and stable Respiratory status: spontaneous breathing, nonlabored ventilation, respiratory function stable and patient connected to nasal cannula oxygen Cardiovascular status: stable and blood pressure returned to baseline Postop Assessment: no apparent nausea or vomiting Anesthetic complications: no   No complications documented.  Last Vitals:  Vitals:   09/07/19 1717 09/07/19 1720  BP: (!) 110/50 131/66  Pulse: 65 64  Resp: 18 18  Temp:  36.9 C  SpO2: 100%     Last Pain:  Vitals:   09/07/19 1720  TempSrc: Oral  PainSc:                  [name]

## 2019-09-07 NOTE — Op Note (Signed)
    OPERATIVE REPORT  DATE OF SURGERY: 09/07/2019  PATIENT: Bailey [name], 69 y.o. female MRN: [id number]  DOB: [birth year]  PRE-OPERATIVE DIAGNOSIS: End-stage renal disease  POST-OPERATIVE DIAGNOSIS:  Same  PROCEDURE: Left tunneled IJ hemodialysis catheter  SURGEON:  Curt Jews, M.D.  PHYSICIAN ASSISTANT: Nurse  The assistant was needed for exposure and to expedite the case  ANESTHESIA: Local with sedation  EBL: per anesthesia record  Total I/O In: 180 [P.O.:30; I.V.:150] Out: -   BLOOD ADMINISTERED: none  DRAINS: none  SPECIMEN: none  COUNTS CORRECT:  YES  PATIENT DISPOSITION:  PACU - hemodynamically stable  PROCEDURE DETAILS: Patient was taken operating placed supine additionally the area of the right and left neck prepped draped you sterile fashion.  SonoSite was used to visualize the jugular veins bilaterally.  Patient a widely patent left internal jugular vein.  She had had a recent removal of a right internal jugular vein with concern regarding possible infection.  Decision was made to place a left-sided catheter.  With the patient in Trendelenburg position the area was instilled with local anesthesia and under direct vision the left internal jugular vein was accessed with an 18-gauge needle.  A guidewire was passed through the 18-gauge needle and down to the level of right atrium and this was confirmed with fluoroscopy.  Dilator and peel-away sheath was passed over the guidewire.  A separate incision was made in the hemodialysis tunnel was brought through the subcutaneous tunnel to the sheath entry site.  The catheter was passed down the peel-away sheath and the tips were placed in the level of the distal right atrium.  Both lumens flushed and aspirated easily after the peel-away sheath was removed.  The catheter was secured to the skin with a 3-0 nylon stitch and the entry site was closed with a 4-0 subcuticular Vicryl stitch.  The lumens were flushed and locked with  1000/cc heparin.  The patient was transferred to the recovery room with chest x-ray pending   Rosetta Posner, M.D., [facility] 09/07/2019 11:21 AM

## 2019-09-07 NOTE — Transfer of Care (Signed)
Immediate Anesthesia Transfer of Care Note  Patient: Bailey [name]  Procedure(s) Performed: INSERTION OF LEFT INTERNAL JUGULAR DIALYSIS CATHETER (Left Neck)  Patient Location: PACU    Anesthesia Type:MAC  Level of Consciousness: awake, alert  and oriented  Airway & Oxygen Therapy: Patient Spontanous Breathing and Patient connected to nasal cannula oxygen  Post-op Assessment: Report given to RN, Post -op Vital signs reviewed and stable and Patient moving all extremities X 4  Post vital signs: Reviewed and stable  Last Vitals:  Vitals Value Taken Time  BP 122/53 09/07/19 1120  Temp    Pulse 65 09/07/19 1123  Resp 22 09/07/19 1123  SpO2 96 % 09/07/19 1123  Vitals shown include unvalidated device data.  Last Pain:  Vitals:   09/07/19 0732  TempSrc: Oral  PainSc: 4       Patients Stated Pain Goal: 0 (69/50/72 2575)  Complications: No complications documented.

## 2019-09-07 NOTE — Progress Notes (Signed)
OT Cancellation Note  Patient Details Name: Bailey [name] MRN: [id number] DOB: 1950/04/01   Cancelled Treatment:    Reason Eval/Treat Not Completed: Patient at procedure or test/ unavailable Patient off floor at dialysis.  Will follow up later today as time allows.   August Luz, OTR/L  Phylliss Bob 09/07/2019, 1:36 PM

## 2019-09-07 NOTE — Progress Notes (Signed)
OT Cancellation Note  Patient Details Name: Bailey [name] MRN: [id number] DOB: 1950/09/14   Cancelled Treatment:    Reason Eval/Treat Not Completed: Patient at procedure or test/ unavailable.  Patient off floor at procedure.  Will check back later today if time allows.  August Luz, OTR/L   Phylliss Bob 09/07/2019, 10:35 AM

## 2019-09-08 ENCOUNTER — Telehealth: Payer: Self-pay | Admitting: Hematology and Oncology

## 2019-09-08 ENCOUNTER — Inpatient Hospital Stay (HOSPITAL_COMMUNITY): Payer: Medicare HMO

## 2019-09-08 ENCOUNTER — Encounter (HOSPITAL_COMMUNITY): Payer: Self-pay | Admitting: Vascular Surgery

## 2019-09-08 LAB — CBC WITH DIFFERENTIAL/PLATELET
Abs Immature Granulocytes: 0.16 10*3/uL — ABNORMAL HIGH (ref 0.00–0.07)
Basophils Absolute: 0 10*3/uL (ref 0.0–0.1)
Basophils Relative: 1 %
Eosinophils Absolute: 0.2 10*3/uL (ref 0.0–0.5)
Eosinophils Relative: 3 %
HCT: 24.6 % — ABNORMAL LOW (ref 36.0–46.0)
Hemoglobin: 7.9 g/dL — ABNORMAL LOW (ref 12.0–15.0)
Immature Granulocytes: 3 %
Lymphocytes Relative: 19 %
Lymphs Abs: 1 10*3/uL (ref 0.7–4.0)
MCH: 30 pg (ref 26.0–34.0)
MCHC: 32.1 g/dL (ref 30.0–36.0)
MCV: 93.5 fL (ref 80.0–100.0)
Monocytes Absolute: 0.5 10*3/uL (ref 0.1–1.0)
Monocytes Relative: 9 %
Neutro Abs: 3.7 10*3/uL (ref 1.7–7.7)
Neutrophils Relative %: 65 %
Platelets: 124 10*3/uL — ABNORMAL LOW (ref 150–400)
RBC: 2.63 MIL/uL — ABNORMAL LOW (ref 3.87–5.11)
RDW: 13.5 % (ref 11.5–15.5)
WBC: 5.6 10*3/uL (ref 4.0–10.5)
nRBC: 0 % (ref 0.0–0.2)

## 2019-09-08 LAB — BASIC METABOLIC PANEL
Anion gap: 10 (ref 5–15)
BUN: 19 mg/dL (ref 8–23)
CO2: 28 mmol/L (ref 22–32)
Calcium: 7.3 mg/dL — ABNORMAL LOW (ref 8.9–10.3)
Chloride: 98 mmol/L (ref 98–111)
Creatinine, Ser: 2.29 mg/dL — ABNORMAL HIGH (ref 0.44–1.00)
GFR calc Af Amer: 24 mL/min — ABNORMAL LOW (ref >60)
GFR calc non Af Amer: 21 mL/min — ABNORMAL LOW (ref >60)
Glucose, Bld: 180 mg/dL — ABNORMAL HIGH (ref 70–99)
Potassium: 3.4 mmol/L — ABNORMAL LOW (ref 3.5–5.1)
Sodium: 136 mmol/L (ref 135–145)

## 2019-09-08 MED ORDER — POTASSIUM CHLORIDE CRYS ER 20 MEQ PO TBCR
20.0000 meq | EXTENDED_RELEASE_TABLET | Freq: Once | ORAL | Status: AC
  Administered 2019-09-08: 20 meq via ORAL
  Filled 2019-09-08: qty 1

## 2019-09-08 MED ORDER — CEFAZOLIN SODIUM-DEXTROSE 2-4 GM/100ML-% IV SOLN: 2.0000 g | INTRAVENOUS | 2 refills | Status: AC

## 2019-09-08 MED ORDER — CARVEDILOL 3.125 MG PO TABS: 3.1250 mg | ORAL_TABLET | Freq: Two times a day (BID) | ORAL | 1 refills | Status: DC

## 2019-09-08 MED ORDER — CEFAZOLIN SODIUM-DEXTROSE 1-4 GM/50ML-% IV SOLN
1.0000 g | INTRAVENOUS | Status: DC
  Administered 2019-09-08: 1 g via INTRAVENOUS
  Filled 2019-09-08: qty 50

## 2019-09-08 NOTE — Progress Notes (Signed)
Patient ID: Bailey [name], female   DOB: 1950-12-21, 69 y.o.   MRN: [id number] Left IJ catheter work without difficulty with hemodialysis.  Patient states plan is for discharge today  Hematoma resolving in left upper arm around fistula.  Does have excellent size maturation.  We will see in the office in 1 month for continued follow-up.  Hopefully can reattempt access at that point

## 2019-09-08 NOTE — Discharge Summary (Signed)
Physician Discharge Summary  Bailey [name] RSW:[id number] DOB: 10-31-1950 DOA: 09/02/2019  PCP: [name], MD  Admit date: 09/02/2019 Discharge date: 09/08/2019  Admitted From: Home Disposition:  Home  Discharge Condition:Stable CODE STATUS:FULL Diet recommendation: Heart Healthy    Brief/Interim Summary:  Patient is a 69 year old female with history of ESRD on dialysis, type 2 diabetes mellitus, anemia of chronic disease, right foot osteomyelitis, vitamin B12 deficiency, hypertension who presented with generalized weakness.  On presentation she was febrile, hemoglobin of 5.8 but no leukocytosis.  She was transfused in the emergency department but developed acute respite distress that night after starting blood transfusion with suspicion for TACO.  Blood cultures showed MSSA.  Started on Ancef.  ID consulted.Echo  did not show any evidence of vegetation.  Repeat blood cultures have been negative.  ID recommended to continue antibiotics for total of 6 weeks.  Vascular surgery placed temporary dialysis catheter .  She will continue IV antibiotic on the dialysis days.  She is medically stable for discharge to home today.  Following problems were addressed during her hospitalization:  Septicemia/MSSA bacteremia: Presented with generalized weakness, fever.  Suspected to be catheter related.  Hemodialysis catheter removed on 09/04/2019 by IR.  Repeat cultures have been negative.  2D echo did not show any valvular vegetation.  Plan to continue his Ancef for total of 6 weeks.  ID was following.  Last day of antibiotic will be 8/23.  Currently she is hemodynamically stable.  Normocytic anemia: Most likely associated with ESRD.  Currently hemoglobin in the range of 7.  She was given a unit of PRBC during this hospitalization.    Transfusion associated circulatory overload/acute hypoxic respiratory failure :there was suspicion for transfusion associated circulatory overload.  Chest x-ray showed  increasing pulmonary vascular consistent with  pulmonary edema.  Received diuretics.  Volume being managed by dialysis.  Currently on room air.  ESRD: Being dialyzed on TTS schedule.  Nephrology following.  She underwent AV fistula placement on left upper arm by vascular surgery, evidently is not working. Underwent  temporary dialysis catheter placement.  Hypertension: On Coreg and amlodipine  Chronic anxiety/depression: Continue home medications  Thrombocytopenia: Chronic.  Continue to monitor  Diabetes type 2: Diet controlled.  Last hemoglobin C 5.5.  Generalized weakness: Likely associated with sepsis.  PT recommended home with home health on discharge.   Discharge Diagnoses:  Principal Problem:   Symptomatic anemia Active Problems:   ESRD on dialysis (HCC)   Benign essential HTN   Thrombocytopenia (HCC)   Hypokalemia   Fever of unknown origin   Malnutrition of moderate degree    Discharge Instructions  Discharge Instructions    Diet - low sodium heart healthy   Complete by: As directed    Discharge instructions   Complete by: As directed    1)Please take prescribed medications as instructed. 2)Follow up with your PCP in a week.   Increase activity slowly   Complete by: As directed    No wound care   Complete by: As directed      Allergies as of 09/08/2019   No Known Allergies     Medication List    STOP taking these medications   furosemide 20 MG tablet Commonly known as: LASIX   NIFEdipine 30 MG 24 hr tablet Commonly known as: PROCARDIA-XL/NIFEDICAL-XL     TAKE these medications   acetaminophen 500 MG tablet Commonly known as: TYLENOL Take 1,000 mg by mouth every 6 (six) hours as needed for fever  or headache (pain).   amlodipine-atorvastatin 10-10 MG tablet Commonly known as: CADUET Take 1 tablet by mouth daily. 2.5 mg at night per husband   carvedilol 3.125 MG tablet Commonly known as: COREG Take 1 tablet (3.125 mg total) by mouth 2  (two) times daily with a meal. What changed:   medication strength  how much to take   ceFAZolin 2-4 GM/100ML-% IVPB Commonly known as: ANCEF Inject 100 mLs (2 g total) into the vein Every Tuesday,Thursday,and Saturday with dialysis. Start taking on: September 09, 2019   cyanocobalamin 1000 MCG/ML injection Commonly known as: (VITAMIN B-12) Inject 1,000 mcg into the muscle every 30 (thirty) days.   escitalopram 5 MG tablet Commonly known as: LEXAPRO Take 5 mg by mouth daily as needed (anxiety).   sodium bicarbonate 650 MG tablet Take 650 mg by mouth 2 (two) times daily.   Vitamin D (Ergocalciferol) 1.25 MG (50000 UNIT) Caps capsule Commonly known as: DRISDOL Take 50,000 Units by mouth every Sunday.   Vitamin D-3 125 MCG (5000 UT) Tabs Take 5,000 Units by mouth 2 (two) times daily.       Follow-up Information    Early, Arvilla Meres, MD In 4 weeks.   Specialties: Vascular Surgery, Cardiology Why: Office will call you to arrange your appt (sent) Contact information: [place] [address] [phone]              No Known Allergies  Consultations:  Nephrology,vascular surgery   Procedures/Studies: DG Chest 2 View  Result Date: 09/02/2019 CLINICAL DATA:  Weakness, suspected sepsis EXAM: CHEST - 2 VIEW COMPARISON:  04/13/2010 FINDINGS: Frontal and lateral views of the chest demonstrate right internal jugular dialysis catheter tip in the region of the superior vena cava. Cardiac silhouette is enlarged. There is mild diffuse interstitial prominence without airspace disease, effusion, or pneumothorax. No acute bony abnormalities. IMPRESSION: 1. Mild diffuse interstitial prominence, likely chronic. No superimposed airspace disease. Electronically Signed   By: Randa Ngo M.D.   On: 09/02/2019 17:04   IR Removal Tun Cv Cath W/O FL  Result Date: 09/04/2019 INDICATION: Patient with history of ESRD on HD s/p tunneled right IJ HD catheter placement by [facility] vascular.  Patient now with functioning AVF. Request is made for removal of tunneled HD catheter. EXAM: REMOVAL OF TUNNELED HEMODIALYSIS CATHETER MEDICATIONS: None COMPLICATIONS: None immediate. PROCEDURE: Informed written consent was obtained from the patient following an explanation of the procedure, risks, benefits and alternatives to treatment. A time out was performed prior to the initiation of the procedure. Maximal barrier sterile technique was utilized including mask, sterile gowns, sterile gloves, large sterile drape, hand hygiene, and Hibiclens. Approximately 5 sutures were removed from site. Utilizing gentle traction, the catheter was removed intact. Hemostasis was obtained with manual compression. A dressing was placed. The patient tolerated the procedure well without immediate post procedural complication. IMPRESSION: Successful removal of tunneled dialysis catheter. Read by: Earley Abide, PA-C Electronically Signed   By: Sandi Mariscal M.D.   On: 09/04/2019 16:03   DG Chest Port 1 View  Result Date: 09/08/2019 CLINICAL DATA:  Dialysis catheter placement. EXAM: PORTABLE CHEST 1 VIEW COMPARISON:  09/03/2019 FINDINGS: There has been interval removal of a right IJ hemodialysis catheter. Interval placement of a left internal jugular approach dual lumen hemodialysis catheter with distal tip terminating at the level of the right atrium. Stable mild cardiomegaly. Atherosclerotic calcification of the aortic knob. Small left pleural effusion. Mild vascular congestion and bilateral interstitial prominence, slightly decreased from  prior. No pneumothorax. IMPRESSION: 1. Interval placement of left internal jugular approach dual lumen hemodialysis catheter with distal tip terminating at the level of the right atrium. No pneumothorax. 2. Slight interval improvement in pulmonary edema. 3. Small left pleural effusion. Electronically Signed   By: Davina Poke D.O.   On: 09/08/2019 08:49   DG CHEST PORT 1 VIEW  Result  Date: 09/03/2019 CLINICAL DATA:  Acute respiratory failure with hypoxia. EXAM: PORTABLE CHEST 1 VIEW COMPARISON:  Chest x-ray dated 09/02/2019. FINDINGS: Stable cardiomegaly. RIGHT chest wall catheter is stable in position with tip at the level of the lower SVC. Increased central pulmonary vascular congestion and bilateral interstitial prominence. No confluent opacity to suggest consolidating pneumonia. No pleural effusion or pneumothorax is seen. IMPRESSION: Increased central pulmonary vascular congestion and bilateral interstitial prominence, suggesting worsening fluid status, CHF/volume overload. Electronically Signed   By: Franki Cabot M.D.   On: 09/03/2019 05:47   DG Fluoro Guide CV Line-No Report  Result Date: 09/07/2019 Fluoroscopy was utilized by the requesting physician.  No radiographic interpretation.   ECHOCARDIOGRAM COMPLETE  Result Date: 09/04/2019    ECHOCARDIOGRAM REPORT   Patient Name:   GWENDA HEINER [facility] Date of Exam: 09/04/2019 Medical Rec #:  [id number]      Height:       63.0 in Accession #:    [id number]     Weight:       115.7 lb Date of Birth:  1950-04-01      BSA:          1.533 m Patient Age:    58 years       BP:           141/65 mmHg Patient Gender: F              HR:           73 bpm. Exam Location:  Inpatient Procedure: 2D Echo Indications:    bacteremia 790.7  History:        Patient has no prior history of Echocardiogram examinations. End                 stage renal disease, Signs/Symptoms:Bacteremia and Fever; Risk                 Factors:Hypertension and Diabetes.  Sonographer:    Johny Chess Referring Phys: [id number] Damar  1. Left ventricular ejection fraction, by estimation, is 50 to 55%. The left ventricle has low normal function. The left ventricle has no regional wall motion abnormalities. Left ventricular diastolic parameters are consistent with Grade II diastolic dysfunction (pseudonormalization).  2. Right ventricular systolic function is normal.  The right ventricular size is normal.  3. Left atrial size was severely dilated.  4. The mitral valve is normal in structure. Mild mitral valve regurgitation. No evidence of mitral stenosis.  5. The aortic valve is normal in structure. Aortic valve regurgitation is not visualized. Mild to moderate aortic valve sclerosis/calcification is present, without any evidence of aortic stenosis.  6. The inferior vena cava is dilated in size with <50% respiratory variability, suggesting right atrial pressure of 15 mmHg. FINDINGS  Left Ventricle: Left ventricular ejection fraction, by estimation, is 50 to 55%. The left ventricle has low normal function. The left ventricle has no regional wall motion abnormalities. The left ventricular internal cavity size was normal in size. There is no left ventricular hypertrophy. Left ventricular diastolic parameters are consistent with Grade II diastolic dysfunction (pseudonormalization). Right  Ventricle: The right ventricular size is normal.Right ventricular systolic function is normal. Left Atrium: Left atrial size was severely dilated. Right Atrium: Right atrial size was normal in size. Pericardium: Trivial pericardial effusion is present. Mitral Valve: The mitral valve is normal in structure. Normal mobility of the mitral valve leaflets. Moderate mitral annular calcification. Mild mitral valve regurgitation. No evidence of mitral valve stenosis. Tricuspid Valve: The tricuspid valve is normal in structure. Tricuspid valve regurgitation is trivial. No evidence of tricuspid stenosis. Aortic Valve: The aortic valve is normal in structure. Aortic valve regurgitation is not visualized. Mild to moderate aortic valve sclerosis/calcification is present, without any evidence of aortic stenosis. Pulmonic Valve: The pulmonic valve was normal in structure. Pulmonic valve regurgitation is trivial. No evidence of pulmonic stenosis. Aorta: The aortic root is normal in size and structure. Venous: The  inferior vena cava is dilated in size with less than 50% respiratory variability, suggesting right atrial pressure of 15 mmHg.  LEFT VENTRICLE PLAX 2D LVIDd:         5.20 cm  Diastology LVIDs:         3.40 cm  LV e' lateral:   7.07 cm/s LV PW:         1.00 cm  LV E/e' lateral: 19.7 LV IVS:        0.80 cm  LV e' medial:    7.40 cm/s LVOT diam:     2.30 cm  LV E/e' medial:  18.8 LV SV:         98 LV SV Index:   64 LVOT Area:     4.15 cm  RIGHT VENTRICLE             IVC RV S prime:     13.70 cm/s  IVC diam: 2.80 cm TAPSE (M-mode): 2.2 cm LEFT ATRIUM              Index       RIGHT ATRIUM           Index LA diam:        4.00 cm  2.61 cm/m  RA Area:     14.50 cm LA Vol (A2C):   105.0 ml 68.51 ml/m RA Volume:   33.60 ml  21.92 ml/m LA Vol (A4C):   56.2 ml  36.67 ml/m LA Biplane Vol: 79.7 ml  52.00 ml/m  AORTIC VALVE LVOT Vmax:   122.00 cm/s LVOT Vmean:  79.800 cm/s LVOT VTI:    0.237 m  AORTA Ao Root diam: 3.10 cm Ao Asc diam:  3.30 cm MITRAL VALVE MV Area (PHT): 3.72 cm     SHUNTS MV Decel Time: 204 msec     Systemic VTI:  0.24 m MV E velocity: 139.00 cm/s  Systemic Diam: 2.30 cm MV A velocity: 110.00 cm/s MV E/A ratio:  1.26 Kirk Ruths MD Electronically signed by Kirk Ruths MD Signature Date/Time: 09/04/2019/2:37:34 PM    Final        Subjective: Patient seen and examined at the bedside this morning.  Hemodynamically stable for discharge today.  Discharge Exam: Vitals:   09/08/19 0604 09/08/19 0857  BP:  (!) 164/61  Pulse: 71 70  Resp: (!) 22 19  Temp:  98.4 F (36.9 C)  SpO2: 100% 100%   Vitals:   09/08/19 0107 09/08/19 0517 09/08/19 0604 09/08/19 0857  BP: (!) 152/63 (!) 164/63  (!) 164/61  Pulse: 76 72 71 70  Resp: 19 (!) 27 (!) 22 19  Temp: 98.2  F (36.8 C) 98.6 F (37 C)  98.4 F (36.9 C)  TempSrc: Oral Oral  Oral  SpO2: 99% 97% 100% 100%  Weight:  52.6 kg    Height:        General: Pt is alert, awake, not in acute distress Cardiovascular: RRR, S1/S2 +, no rubs, no  gallops Respiratory: CTA bilaterally, no wheezing, no rhonchi Abdominal: Soft, NT, ND, bowel sounds + Extremities: no edema, no cyanosis    The results of significant diagnostics from this hospitalization (including imaging, microbiology, ancillary and laboratory) are listed below for reference.     Microbiology: Recent Results (from the past 240 hour(s))  Culture, blood (Routine x 2)     Status: Abnormal   Collection Time: 09/02/19  4:10 PM   Specimen: BLOOD  Result Value Ref Range Status   Specimen Description BLOOD BLOOD LEFT FOREARM  Final   Special Requests   Final    BOTTLES DRAWN AEROBIC AND ANAEROBIC Blood Culture adequate volume   Culture  Setup Time   Final    GRAM POSITIVE COCCI IN CLUSTERS IN BOTH AEROBIC AND ANAEROBIC BOTTLES CRITICAL VALUE NOTED.  VALUE IS CONSISTENT WITH PREVIOUSLY REPORTED AND CALLED VALUE.    Culture (A)  Final    STAPHYLOCOCCUS AUREUS SUSCEPTIBILITIES PERFORMED ON PREVIOUS CULTURE WITHIN THE LAST 5 DAYS. Performed at [facility], [place] [address], [place], [place] [address]    Report Status 09/06/2019 FINAL  Final  Culture, blood (Routine x 2)     Status: Abnormal   Collection Time: 09/02/19  4:14 PM   Specimen: BLOOD  Result Value Ref Range Status   Specimen Description BLOOD SITE NOT SPECIFIED  Final   Special Requests   Final    BOTTLES DRAWN AEROBIC AND ANAEROBIC Blood Culture results may not be optimal due to an excessive volume of blood received in culture bottles   Culture  Setup Time   Final    GRAM POSITIVE COCCI IN CLUSTERS IN BOTH AEROBIC AND ANAEROBIC BOTTLES CRITICAL RESULT CALLED TO, READ BACK BY AND VERIFIED WITH: PHARMD K. PIERCE [id number] FCP Performed at [facility], [place] [address] [place], [place] [address]    Culture STAPHYLOCOCCUS AUREUS (A)  Final   Report Status 09/05/2019 FINAL  Final   Organism ID, Bacteria STAPHYLOCOCCUS AUREUS  Final      Susceptibility   Staphylococcus aureus - MIC*     CIPROFLOXACIN <=0.5 SENSITIVE Sensitive     ERYTHROMYCIN RESISTANT Resistant     GENTAMICIN <=0.5 SENSITIVE Sensitive     OXACILLIN <=0.25 SENSITIVE Sensitive     TETRACYCLINE <=1 SENSITIVE Sensitive     VANCOMYCIN <=0.5 SENSITIVE Sensitive     TRIMETH/SULFA <=10 SENSITIVE Sensitive     CLINDAMYCIN RESISTANT Resistant     RIFAMPIN <=0.5 SENSITIVE Sensitive     Inducible Clindamycin POSITIVE Resistant     * STAPHYLOCOCCUS AUREUS  Blood Culture ID Panel (Reflexed)     Status: Abnormal   Collection Time: 09/02/19  4:14 PM  Result Value Ref Range Status   Enterococcus species NOT DETECTED NOT DETECTED Final   Listeria monocytogenes NOT DETECTED NOT DETECTED Final   Staphylococcus species DETECTED (A) NOT DETECTED Final    Comment: CRITICAL RESULT CALLED TO, READ BACK BY AND VERIFIED WITH: PHARMD K. PIERCE [id number] FCP    Staphylococcus aureus (BCID) DETECTED (A) NOT DETECTED Final    Comment: Methicillin (oxacillin) susceptible Staphylococcus aureus (MSSA). Preferred therapy is anti staphylococcal beta lactam antibiotic (  Cefazolin or Nafcillin), unless clinically contraindicated. CRITICAL RESULT CALLED TO, READ BACK BY AND VERIFIED WITH: PHARMD K. PIERCE [phone] FCP    Methicillin resistance NOT DETECTED NOT DETECTED Final   Streptococcus species NOT DETECTED NOT DETECTED Final   Streptococcus agalactiae NOT DETECTED NOT DETECTED Final   Streptococcus pneumoniae NOT DETECTED NOT DETECTED Final   Streptococcus pyogenes NOT DETECTED NOT DETECTED Final   Acinetobacter baumannii NOT DETECTED NOT DETECTED Final   Enterobacteriaceae species NOT DETECTED NOT DETECTED Final   Enterobacter cloacae complex NOT DETECTED NOT DETECTED Final   Escherichia coli NOT DETECTED NOT DETECTED Final   Klebsiella oxytoca NOT DETECTED NOT DETECTED Final   Klebsiella pneumoniae NOT DETECTED NOT DETECTED Final   Proteus species NOT DETECTED NOT DETECTED Final   Serratia marcescens NOT DETECTED NOT  DETECTED Final   Haemophilus influenzae NOT DETECTED NOT DETECTED Final   Neisseria meningitidis NOT DETECTED NOT DETECTED Final   Pseudomonas aeruginosa NOT DETECTED NOT DETECTED Final   Candida albicans NOT DETECTED NOT DETECTED Final   Candida glabrata NOT DETECTED NOT DETECTED Final   Candida krusei NOT DETECTED NOT DETECTED Final   Candida parapsilosis NOT DETECTED NOT DETECTED Final   Candida tropicalis NOT DETECTED NOT DETECTED Final    Comment: Performed at [facility], [place] [address] [place], [place] [address]  SARS Coronavirus 2 by RT PCR (hospital order, performed in [facility] hospital lab) Nasopharyngeal Nasopharyngeal Swab     Status: None   Collection Time: 09/02/19  5:19 PM   Specimen: Nasopharyngeal Swab  Result Value Ref Range Status   SARS Coronavirus 2 NEGATIVE NEGATIVE Final    Comment: (NOTE) SARS-CoV-2 target nucleic acids are NOT DETECTED.  The SARS-CoV-2 RNA is generally detectable in upper and lower respiratory specimens during the acute phase of infection. The lowest concentration of SARS-CoV-2 viral copies this assay can detect is 250 copies / mL. A negative result does not preclude SARS-CoV-2 infection and should not be used as the sole basis for treatment or other patient management decisions.  A negative result may occur with improper specimen collection / handling, submission of specimen other than nasopharyngeal swab, presence of viral mutation(s) within the areas targeted by this assay, and inadequate number of viral copies (<250 copies / mL). A negative result must be combined with clinical observations, patient history, and epidemiological information.  Fact Sheet for Patients:   StrictlyIdeas.no  Fact Sheet for Healthcare Providers: BankingDealers.co.za  This test is not yet approved or  cleared by the [place] FDA and has been authorized for detection and/or diagnosis of SARS-CoV-2  by FDA under an Emergency Use Authorization (EUA).  This EUA will remain in effect (meaning this test can be used) for the duration of the COVID-19 declaration under Section 564(b)(1) of the Act, 21 U.S.C. section 360bbb-3(b)(1), unless the authorization is terminated or revoked sooner.  Performed at [facility], [place] [address], [place], [place] [address]   Urine culture     Status: Abnormal   Collection Time: 09/03/19  5:58 AM   Specimen: Urine, Random  Result Value Ref Range Status   Specimen Description URINE, RANDOM  Final   Special Requests NONE  Final   Culture (A)  Final    <10,000 COLONIES/mL INSIGNIFICANT GROWTH Performed at [facility], [place] [address] [place], [place] [address]    Report Status 09/04/2019 FINAL  Final  MRSA PCR Screening     Status: None   Collection  Time: 09/03/19  5:58 AM   Specimen: Nasopharyngeal  Result Value Ref Range Status   MRSA by PCR NEGATIVE NEGATIVE Final    Comment:        The GeneXpert MRSA Assay (FDA approved for NASAL specimens only), is one component of a comprehensive MRSA colonization surveillance program. It is not intended to diagnose MRSA infection nor to guide or monitor treatment for MRSA infections. Performed at [facility], [place] [address] [place], [place] [address]   Culture, blood (Routine X 2) w Reflex to ID Panel     Status: None (Preliminary result)   Collection Time: 09/04/19  8:53 AM   Specimen: BLOOD RIGHT HAND  Result Value Ref Range Status   Specimen Description BLOOD RIGHT HAND  Final   Special Requests   Final    BOTTLES DRAWN AEROBIC ONLY Blood Culture adequate volume   Culture   Final    NO GROWTH 3 DAYS Performed at [facility], [place] [address], [place], [place] [address]    Report Status PENDING  Incomplete  Culture, blood (Routine X 2) w Reflex to ID Panel     Status: None (Preliminary result)   Collection Time: 09/04/19  8:53 AM   Specimen: BLOOD RIGHT HAND  Result  Value Ref Range Status   Specimen Description BLOOD RIGHT HAND  Final   Special Requests   Final    BOTTLES DRAWN AEROBIC ONLY Blood Culture results may not be optimal due to an inadequate volume of blood received in culture bottles   Culture   Final    NO GROWTH 3 DAYS Performed at [facility], [place] [address], [place], [place] [address]    Report Status PENDING  Incomplete     Labs: BNP (last 3 results) No results for input(s): BNP in the last 8760 hours. Basic Metabolic Panel: Recent Labs  Lab 09/02/19 1811 09/03/19 0340 09/05/19 0348 09/05/19 1157 09/06/19 0513 09/07/19 0819 09/08/19 0309  NA  --    < > 138 139 137 137 136  K  --    < > 2.8* 3.3* 3.4* 4.0 3.4*  CL  --    < > 100 99 100 102 98  CO2  --    < > $R'28 30 26 24 28  'WX$ GLUCOSE  --    < > 102* 116* 92 114* 180*  BUN  --    < > 25* 29* 30* 51* 19  CREATININE  --    < > 3.18* 3.59* 3.50* 4.44* 2.29*  CALCIUM  --    < > 7.5* 7.5* 7.5* 7.8* 7.3*  MG 1.7  --   --   --   --   --   --   PHOS 2.5  --   --  2.2*  --   --   --    < > = values in this interval not displayed.   Liver Function Tests: Recent Labs  Lab 09/02/19 1614 09/03/19 0340 09/05/19 1157  AST 22 24  --   ALT 17 21  --   ALKPHOS 72 73  --   BILITOT 0.5 0.9  --   PROT 5.6* 5.6*  --   ALBUMIN 2.5* 2.5* 1.9*   No results for input(s): LIPASE, AMYLASE in the last 168 hours. No results for input(s): AMMONIA in the last 168 hours. CBC: Recent Labs  Lab 09/04/19 0853 09/05/19 0348 09/06/19 0513 09/07/19 0819 09/08/19 0309  WBC 6.8 6.0  5.4 6.5 5.6  NEUTROABS 5.6 4.2 3.2 4.2 3.7  HGB 8.3* 7.3* 7.6* 7.8* 7.9*  HCT 26.6* 23.3* 24.5* 24.5* 24.6*  MCV 94.0 95.1 95.3 95.0 93.5  PLT PLATELET CLUMPS NOTED ON SMEAR, COUNT APPEARS DECREASED 91* 96* 123* 124*   Cardiac Enzymes: No results for input(s): CKTOTAL, CKMB, CKMBINDEX, TROPONINI in the last 168 hours. BNP: Invalid input(s): POCBNP CBG: Recent Labs  Lab 09/03/19 0820 09/03/19 1158  09/03/19 1609 09/03/19 2034 09/07/19 1121  GLUCAP 131* 150* 158* 188* 112*   D-Dimer No results for input(s): DDIMER in the last 72 hours. Hgb A1c No results for input(s): HGBA1C in the last 72 hours. Lipid Profile No results for input(s): CHOL, HDL, LDLCALC, TRIG, CHOLHDL, LDLDIRECT in the last 72 hours. Thyroid function studies No results for input(s): TSH, T4TOTAL, T3FREE, THYROIDAB in the last 72 hours.  Invalid input(s): FREET3 Anemia work up No results for input(s): VITAMINB12, FOLATE, FERRITIN, TIBC, IRON, RETICCTPCT in the last 72 hours. Urinalysis    Component Value Date/Time   COLORURINE YELLOW 09/03/2019 0558   APPEARANCEUR CLEAR 09/03/2019 0558   LABSPEC 1.014 09/03/2019 0558   PHURINE 9.0 (H) 09/03/2019 0558   GLUCOSEU 150 (A) 09/03/2019 0558   HGBUR SMALL (A) 09/03/2019 0558   BILIRUBINUR NEGATIVE 09/03/2019 0558   BILIRUBINUR small 02/10/2014 1127   KETONESUR NEGATIVE 09/03/2019 0558   PROTEINUR >=300 (A) 09/03/2019 0558   UROBILINOGEN 1.0 02/10/2014 2233   NITRITE NEGATIVE 09/03/2019 0558   LEUKOCYTESUR NEGATIVE 09/03/2019 0558   Sepsis Labs Invalid input(s): PROCALCITONIN,  WBC,  LACTICIDVEN Microbiology Recent Results (from the past 240 hour(s))  Culture, blood (Routine x 2)     Status: Abnormal   Collection Time: 09/02/19  4:10 PM   Specimen: BLOOD  Result Value Ref Range Status   Specimen Description BLOOD BLOOD LEFT FOREARM  Final   Special Requests   Final    BOTTLES DRAWN AEROBIC AND ANAEROBIC Blood Culture adequate volume   Culture  Setup Time   Final    GRAM POSITIVE COCCI IN CLUSTERS IN BOTH AEROBIC AND ANAEROBIC BOTTLES CRITICAL VALUE NOTED.  VALUE IS CONSISTENT WITH PREVIOUSLY REPORTED AND CALLED VALUE.    Culture (A)  Final    STAPHYLOCOCCUS AUREUS SUSCEPTIBILITIES PERFORMED ON PREVIOUS CULTURE WITHIN THE LAST 5 DAYS. Performed at [facility], [place] [address] [place], [place] [address]    Report Status 09/06/2019 FINAL  Final   Culture, blood (Routine x 2)     Status: Abnormal   Collection Time: 09/02/19  4:14 PM   Specimen: BLOOD  Result Value Ref Range Status   Specimen Description BLOOD SITE NOT SPECIFIED  Final   Special Requests   Final    BOTTLES DRAWN AEROBIC AND ANAEROBIC Blood Culture results may not be optimal due to an excessive volume of blood received in culture bottles   Culture  Setup Time   Final    GRAM POSITIVE COCCI IN CLUSTERS IN BOTH AEROBIC AND ANAEROBIC BOTTLES CRITICAL RESULT CALLED TO, READ BACK BY AND VERIFIED WITH: PHARMD K. PIERCE [phone] FCP Performed at [facility], [place] [address], [place], [place] [address]    Culture STAPHYLOCOCCUS AUREUS (A)  Final   Report Status 09/05/2019 FINAL  Final   Organism ID, Bacteria STAPHYLOCOCCUS AUREUS  Final      Susceptibility   Staphylococcus aureus - MIC*    CIPROFLOXACIN <=0.5 SENSITIVE Sensitive     ERYTHROMYCIN RESISTANT Resistant     GENTAMICIN <=0.5  SENSITIVE Sensitive     OXACILLIN <=0.25 SENSITIVE Sensitive     TETRACYCLINE <=1 SENSITIVE Sensitive     VANCOMYCIN <=0.5 SENSITIVE Sensitive     TRIMETH/SULFA <=10 SENSITIVE Sensitive     CLINDAMYCIN RESISTANT Resistant     RIFAMPIN <=0.5 SENSITIVE Sensitive     Inducible Clindamycin POSITIVE Resistant     * STAPHYLOCOCCUS AUREUS  Blood Culture ID Panel (Reflexed)     Status: Abnormal   Collection Time: 09/02/19  4:14 PM  Result Value Ref Range Status   Enterococcus species NOT DETECTED NOT DETECTED Final   Listeria monocytogenes NOT DETECTED NOT DETECTED Final   Staphylococcus species DETECTED (A) NOT DETECTED Final    Comment: CRITICAL RESULT CALLED TO, READ BACK BY AND VERIFIED WITH: PHARMD K. PIERCE [phone] FCP    Staphylococcus aureus (BCID) DETECTED (A) NOT DETECTED Final    Comment: Methicillin (oxacillin) susceptible Staphylococcus aureus (MSSA). Preferred therapy is anti staphylococcal beta lactam antibiotic (Cefazolin or Nafcillin), unless clinically  contraindicated. CRITICAL RESULT CALLED TO, READ BACK BY AND VERIFIED WITH: PHARMD K. PIERCE [phone] FCP    Methicillin resistance NOT DETECTED NOT DETECTED Final   Streptococcus species NOT DETECTED NOT DETECTED Final   Streptococcus agalactiae NOT DETECTED NOT DETECTED Final   Streptococcus pneumoniae NOT DETECTED NOT DETECTED Final   Streptococcus pyogenes NOT DETECTED NOT DETECTED Final   Acinetobacter baumannii NOT DETECTED NOT DETECTED Final   Enterobacteriaceae species NOT DETECTED NOT DETECTED Final   Enterobacter cloacae complex NOT DETECTED NOT DETECTED Final   Escherichia coli NOT DETECTED NOT DETECTED Final   Klebsiella oxytoca NOT DETECTED NOT DETECTED Final   Klebsiella pneumoniae NOT DETECTED NOT DETECTED Final   Proteus species NOT DETECTED NOT DETECTED Final   Serratia marcescens NOT DETECTED NOT DETECTED Final   Haemophilus influenzae NOT DETECTED NOT DETECTED Final   Neisseria meningitidis NOT DETECTED NOT DETECTED Final   Pseudomonas aeruginosa NOT DETECTED NOT DETECTED Final   Candida albicans NOT DETECTED NOT DETECTED Final   Candida glabrata NOT DETECTED NOT DETECTED Final   Candida krusei NOT DETECTED NOT DETECTED Final   Candida parapsilosis NOT DETECTED NOT DETECTED Final   Candida tropicalis NOT DETECTED NOT DETECTED Final    Comment: Performed at [facility], [place] [name] [place], [place] [address]  SARS Coronavirus 2 by RT PCR (hospital order, performed in [facility] hospital lab) Nasopharyngeal Nasopharyngeal Swab     Status: None   Collection Time: 09/02/19  5:19 PM   Specimen: Nasopharyngeal Swab  Result Value Ref Range Status   SARS Coronavirus 2 NEGATIVE NEGATIVE Final    Comment: (NOTE) SARS-CoV-2 target nucleic acids are NOT DETECTED.  The SARS-CoV-2 RNA is generally detectable in upper and lower respiratory specimens during the acute phase of infection. The lowest concentration of SARS-CoV-2 viral copies this assay can detect is  250 copies / mL. A negative result does not preclude SARS-CoV-2 infection and should not be used as the sole basis for treatment or other patient management decisions.  A negative result may occur with improper specimen collection / handling, submission of specimen other than nasopharyngeal swab, presence of viral mutation(s) within the areas targeted by this assay, and inadequate number of viral copies (<250 copies / mL). A negative result must be combined with clinical observations, patient history, and epidemiological information.  Fact Sheet for Patients:   StrictlyIdeas.no  Fact Sheet for Healthcare Providers: BankingDealers.co.za  This test is not yet approved or  cleared by  the [employer] and has been authorized for detection and/or diagnosis of SARS-CoV-2 by FDA under an Emergency Use Authorization (EUA).  This EUA will remain in effect (meaning this test can be used) for the duration of the COVID-19 declaration under Section 564(b)(1) of the Act, 21 U.S.C. section 360bbb-3(b)(1), unless the authorization is terminated or revoked sooner.  Performed at [facility], [place] [address] [place], [place] [address]   Urine culture     Status: Abnormal   Collection Time: 09/03/19  5:58 AM   Specimen: Urine, Random  Result Value Ref Range Status   Specimen Description URINE, RANDOM  Final   Special Requests NONE  Final   Culture (A)  Final    <10,000 COLONIES/mL INSIGNIFICANT GROWTH Performed at [facility], [place] [address] [place], [place] [address]    Report Status 09/04/2019 FINAL  Final  MRSA PCR Screening     Status: None   Collection Time: 09/03/19  5:58 AM   Specimen: Nasopharyngeal  Result Value Ref Range Status   MRSA by PCR NEGATIVE NEGATIVE Final    Comment:        The GeneXpert MRSA Assay (FDA approved for NASAL specimens only), is one component of a comprehensive MRSA colonization surveillance  program. It is not intended to diagnose MRSA infection nor to guide or monitor treatment for MRSA infections. Performed at [facility], [place] [address], [place], [place] [address]   Culture, blood (Routine X 2) w Reflex to ID Panel     Status: None (Preliminary result)   Collection Time: 09/04/19  8:53 AM   Specimen: BLOOD RIGHT HAND  Result Value Ref Range Status   Specimen Description BLOOD RIGHT HAND  Final   Special Requests   Final    BOTTLES DRAWN AEROBIC ONLY Blood Culture adequate volume   Culture   Final    NO GROWTH 3 DAYS Performed at [facility], [place] [address], [place], [place] [address]    Report Status PENDING  Incomplete  Culture, blood (Routine X 2) w Reflex to ID Panel     Status: None (Preliminary result)   Collection Time: 09/04/19  8:53 AM   Specimen: BLOOD RIGHT HAND  Result Value Ref Range Status   Specimen Description BLOOD RIGHT HAND  Final   Special Requests   Final    BOTTLES DRAWN AEROBIC ONLY Blood Culture results may not be optimal due to an inadequate volume of blood received in culture bottles   Culture   Final    NO GROWTH 3 DAYS Performed at [facility], [place] [address] [place], [place] [address]    Report Status PENDING  Incomplete    Please note: You were cared for by a hospitalist during your hospital stay. Once you are discharged, your primary care physician will handle any further medical issues. Please note that NO REFILLS for any discharge medications will be authorized once you are discharged, as it is imperative that you return to your primary care physician (or establish a relationship with a primary care physician if you do not have one) for your post hospital discharge needs so that they can reassess your need for medications and monitor your lab values.    Time coordinating discharge: 40 minutes  SIGNED:   Shelly Coss, MD  [facility] 09/08/2019, 11:41 AM Pager [phone]  If 7PM-7AM, please  contact night-coverage [email or web address] Password [email or web address]

## 2019-09-08 NOTE — Progress Notes (Signed)
Physical Therapy Treatment Patient Details Name: Bailey [name] MRN: [id number] DOB: [birth year] Today's Date: 09/08/2019    History of Present Illness 69 y.o. female with medical history significant of ESRD recently started on dialysis (TTS), diet-controlled type 2 diabetes mellitus, anemia of chronic disease, history of osteomyelitis, hypertension came from dialysis with complaint of generalized weakness. In ED found to have fever of 101.8 and was anemic H&H 5.8/18.1 transfused 1 unit PRBC with suspected transfusion associated circulatory overload. Admitted 09/02/19 for treatment of acute on chronic anemia found to be septic due to MSSA bacteremia    PT Comments    Pt up in recliner with daughter present in room. Both of them are looking forward to d/c home today. Discussed pt needing to climb 6 steps to get into home, but daughter clarifies that if pull up to back steps there are 2-3 steps. Pt continues to be limited in safe mobility by decreased cognition/short term memory in presence of decreased balance and strength. Pt is supervision for transfers and min guard for ambulation with RW. Pt reports she does not have a RW at home and PT requested from case manager before d/c.     Follow Up Recommendations  Home health PT;Supervision/Assistance - 24 hour     Equipment Recommendations  Rolling walker with 5" wheels       Precautions / Restrictions Precautions Precautions: Fall Restrictions Weight Bearing Restrictions: No    Mobility  Bed Mobility         Supine to sit: Supervision     General bed mobility comments: OOB in recliner   Transfers Overall transfer level: Needs assistance Equipment used: Rolling walker (2 wheeled) Transfers: Sit to/from Stand Sit to Stand: Min guard         General transfer comment: min guard for safety with power up and steadying with the RW    Ambulation/Gait Ambulation/Gait assistance: Min guard Gait Distance (Feet): 60 Feet Assistive  device: None Gait Pattern/deviations: Step-through pattern;Decreased stride length Gait velocity: slowed Gait velocity interpretation: <1.8 ft/sec, indicate of risk for recurrent falls General Gait Details: min guard for safety, increased cuing for proximity to RW and keeping RW on floor          Balance Overall balance assessment: Needs assistance Sitting-balance support: Feet supported;No upper extremity supported;Feet unsupported Sitting balance-Leahy Scale: Good     Standing balance support: No upper extremity supported;During functional activity Standing balance-Leahy Scale: Fair Standing balance comment: able to static stand but needs support for dynamic balance                            Cognition Arousal/Alertness: Awake/alert Behavior During Therapy: WFL for tasks assessed/performed Overall Cognitive Status: Impaired/Different from baseline Area of Impairment: Memory;Problem solving                     Memory: Decreased short-term memory       Problem Solving: Requires verbal cues;Requires tactile cues;Difficulty sequencing General Comments: continues to need increased cuing for safety         General Comments General comments (skin integrity, edema, etc.): VSS on RA, daughter in room       Pertinent Vitals/Pain Pain Assessment: No/denies pain           PT Goals (current goals can now be found in the care plan section) Acute Rehab PT Goals Patient Stated Goal: feel better PT Goal Formulation: With patient Time For Goal  Achievement: 09/20/19 Potential to Achieve Goals: Good Progress towards PT goals: Progressing toward goals    Frequency    Min 3X/week      PT Plan Equipment recommendations need to be updated;Current plan remains appropriate       AM-PAC PT "6 Clicks" Mobility   Outcome Measure  Help needed turning from your back to your side while in a flat bed without using bedrails?: None Help needed moving from  lying on your back to sitting on the side of a flat bed without using bedrails?: A Little Help needed moving to and from a bed to a chair (including a wheelchair)?: None Help needed standing up from a chair using your arms (e.g., wheelchair or bedside chair)?: None Help needed to walk in hospital room?: A Little Help needed climbing 3-5 steps with a railing? : A Little 6 Click Score: 21    End of Session Equipment Utilized During Treatment: Gait belt Activity Tolerance: Patient tolerated treatment well Patient left: in chair;with call bell/phone within reach;with chair alarm set;with family/visitor present Nurse Communication: Mobility status PT Visit Diagnosis: Unsteadiness on feet (R26.81);Other abnormalities of gait and mobility (R26.89);Muscle weakness (generalized) (M62.81);Difficulty in walking, not elsewhere classified (R26.2)     Time: 5189-8421 PT Time Calculation (min) (ACUTE ONLY): 20 min  Charges:  $Gait Training: 8-22 mins                     Linsay Vogt B. Migdalia Dk PT, DPT Acute Rehabilitation Services Pager [phone] Office [phone]    [place] 09/08/2019, 12:56 PM

## 2019-09-08 NOTE — Care Management Important Message (Signed)
Important Message  Patient Details  Name: Bailey [name] MRN: [id number] Date of Birth: [birth year]   Medicare Important Message Given:  Yes     Shelda Altes 09/08/2019, 11:46 AM

## 2019-09-08 NOTE — Progress Notes (Signed)
[facility] Progress Note   Subjective: Seen in room, eating breakfast. No complaints today.   Objective Vitals:   09/08/19 0107 09/08/19 0517 09/08/19 0604 09/08/19 0857  BP: (!) 152/63 (!) 164/63  (!) 164/61  Pulse: 76 72 71 70  Resp: 19 (!) 27 (!) 22 19  Temp: 98.2 F (36.8 C) 98.6 F (37 C)  98.4 F (36.9 C)  TempSrc: Oral Oral  Oral  SpO2: 99% 97% 100% 100%  Weight:  52.6 kg    Height:       Physical Exam General: chronically ill appearing pt in NAD Heart:RRR Lungs:Decreased in bases otherwise CTAB. No WOB. Abdomen:S, NT Active BS Extremities:No Sig LE edema  Dialysis Access:LUE AVF +bruit;   New [facility] Drsg CDI.   Additional Objective Labs: Basic Metabolic Panel: Recent Labs  Lab 09/02/19 1811 09/03/19 0340 09/05/19 1157 09/05/19 1157 09/06/19 0513 09/07/19 0819 09/08/19 0309  NA  --    < > 139   < > 137 137 136  K  --    < > 3.3*   < > 3.4* 4.0 3.4*  CL  --    < > 99   < > 100 102 98  CO2  --    < > 30   < > $R'26 24 28  'mO$ GLUCOSE  --    < > 116*   < > 92 114* 180*  BUN  --    < > 29*   < > 30* 51* 19  CREATININE  --    < > 3.59*   < > 3.50* 4.44* 2.29*  CALCIUM  --    < > 7.5*   < > 7.5* 7.8* 7.3*  PHOS 2.5  --  2.2*  --   --   --   --    < > = values in this interval not displayed.   Liver Function Tests: Recent Labs  Lab 09/02/19 1614 09/03/19 0340 09/05/19 1157  AST 22 24  --   ALT 17 21  --   ALKPHOS 72 73  --   BILITOT 0.5 0.9  --   PROT 5.6* 5.6*  --   ALBUMIN 2.5* 2.5* 1.9*   No results for input(s): LIPASE, AMYLASE in the last 168 hours. CBC: Recent Labs  Lab 09/04/19 0853 09/04/19 0853 09/05/19 0348 09/05/19 0348 09/06/19 0513 09/07/19 0819 09/08/19 0309  WBC 6.8   < > 6.0   < > 5.4 6.5 5.6  NEUTROABS 5.6   < > 4.2   < > 3.2 4.2 3.7  HGB 8.3*   < > 7.3*   < > 7.6* 7.8* 7.9*  HCT 26.6*   < > 23.3*   < > 24.5* 24.5* 24.6*  MCV 94.0  --  95.1  --  95.3 95.0 93.5  PLT PLATELET CLUMPS NOTED ON SMEAR,  COUNT APPEARS DECREASED   < > 91*   < > 96* 123* 124*   < > = values in this interval not displayed.   Blood Culture    Component Value Date/Time   SDES BLOOD RIGHT HAND 09/04/2019 0853   SDES BLOOD RIGHT HAND 09/04/2019 0853   SPECREQUEST  09/04/2019 0853    BOTTLES DRAWN AEROBIC ONLY Blood Culture adequate volume   SPECREQUEST  09/04/2019 0853    BOTTLES DRAWN AEROBIC ONLY Blood Culture results may not be optimal due to an inadequate volume of blood received in culture bottles   CULT  09/04/2019 0853  NO GROWTH 3 DAYS Performed at [facility], [place] [address] [place], [place] [address]    CULT  09/04/2019 0853    NO GROWTH 3 DAYS Performed at [facility], [place] [address] [place],  [address]    REPTSTATUS PENDING 09/04/2019 0853   REPTSTATUS PENDING 09/04/2019 0853    Cardiac Enzymes: No results for input(s): CKTOTAL, CKMB, CKMBINDEX, TROPONINI in the last 168 hours. CBG: Recent Labs  Lab 09/03/19 0820 09/03/19 1158 09/03/19 1609 09/03/19 2034 09/07/19 1121  GLUCAP 131* 150* 158* 188* 112*   Iron Studies: No results for input(s): IRON, TIBC, TRANSFERRIN, FERRITIN in the last 72 hours. $RemoveB'@lablastinr3'lZRlYsUT$ @ Studies/Results: DG Chest Port 1 View  Result Date: 09/08/2019 CLINICAL DATA:  Dialysis catheter placement. EXAM: PORTABLE CHEST 1 VIEW COMPARISON:  09/03/2019 FINDINGS: There has been interval removal of a right IJ hemodialysis catheter. Interval placement of a left internal jugular approach dual lumen hemodialysis catheter with distal tip terminating at the level of the right atrium. Stable mild cardiomegaly. Atherosclerotic calcification of the aortic knob. Small left pleural effusion. Mild vascular congestion and bilateral interstitial prominence, slightly decreased from prior. No pneumothorax. IMPRESSION: 1. Interval placement of left internal jugular approach dual lumen hemodialysis catheter with distal tip terminating at the level of the right atrium.  No pneumothorax. 2. Slight interval improvement in pulmonary edema. 3. Small left pleural effusion. Electronically Signed   By: Davina Poke D.O.   On: 09/08/2019 08:49   DG Fluoro Guide CV Line-No Report  Result Date: 09/07/2019 Fluoroscopy was utilized by the requesting physician.  No radiographic interpretation.   Medications: .  ceFAZolin (ANCEF) IV 1 g (09/07/19 1757)   . carvedilol  3.125 mg Oral BID WC  . Chlorhexidine Gluconate Cloth  6 each Topical Daily  . Chlorhexidine Gluconate Cloth  6 each Topical Q0600  . cyanocobalamin  1,000 mcg Intramuscular Q30 days  . darbepoetin (ARANESP) injection - DIALYSIS  100 mcg Intravenous Q Tue-HD  . doxercalciferol  2 mcg Intravenous Q T,Th,Sa-HD  . feeding supplement (ENSURE ENLIVE)  237 mL Oral TID BM  . multivitamin  1 tablet Oral QHS     AHD orders:TTS -[place] 3.5hrs,400/60056kg,2K/2.25Ca Access:L AVF Heparinnone Mircera39mcgIVq2wks - last 08/19/19 Venofer $RemoveBeforeD'50mg'NbvlySgDJxEEaJ$ IVqwk Hectorol56mcg IV qHD   Assessment/Plan: 1. MSSA bacteremia- BC +MSSA. Etiology unclear, most likely catheter related.[place] removed 09/04/2019. Repeat  NGTD. New TDC placed 09/07/19. Continue Ancef post HD through 10/16/2019.  2. Volume overload- CXR with vascular congestion/pulm edemaon adm. Resolved with HD. Continue lowering volume as tolerated in HD. 3. Confusion: Unclear if this is pre-existing issue or related to current sepsis. Resolved.  4. ESRD- New start on HD TTS.Next HD 7/17. Has  Used AVF 7/13 for 1st time and infiltrated X 2. Seen by [facility], concerned that early infiltration is poor prognostic indicator for long term use of AVF. May need to be converted to AVG which will not be done in current setting of sepsis.  5. Hypertension-BP controlled No overt excess volume. Continue homeBPmeds.  6. Anemiaof CKD- Hgb initially 5.8 in ED, improved to 8.5 s/p 2unitsPRBC. Now HGB falling again. Rec'd Aranesp 100 mcg IV 09/05/2019.  Follow HGB. Transfuse as needed.  7. Secondary Hyperparathyroidism -PO4 low, C Ca OK. Continue VDRA. Not on binders. 8. Nutrition- Albumin , K+ and PO4 low. Change to regular diet with fluid restrictions. Continue protein supplements.  9.    DMT2 - per primary  Lynnda Child PA-C [place] 09/08/2019,9:23 AM

## 2019-09-08 NOTE — Telephone Encounter (Signed)
R/s appt per 7/14 sch msg - unable to reach pt . Left message with new appt date and time

## 2019-09-08 NOTE — TOC Transition Note (Signed)
Transition of Care [facility]) - CM/SW Discharge Note   Patient Details  Name: Bailey [name] MRN: [id number] Date of Birth: 11-Nov-1950  Transition of Care [facility]) CM/SW Contact:  Bethena Roys, RN Phone Number: 09/08/2019, 12:35 PM   Clinical Narrative: Case Manager had previously offered choice to patient and she chose [facility] for home health services. Referral made and start of care to begin within 24-48 hours. Patient states husband works, but her friend [name] will be with her during the days. Patient will need a rolling walker-order sent to [employer]-will be delivered to room prior to transition home. No further needs at this time.    Final next level of care: [place] Barriers to Discharge: No Barriers Identified   Patient Goals and CMS Choice Patient states their goals for this hospitalization and ongoing recovery are:: to return home once stable CMS Medicare.gov Compare Post Acute Care list provided to:: Patient Choice offered to / list presented to : Patient  Discharge Plan and Services In-house Referral: NA Discharge Planning Services: CM Consult Post Acute Care Choice: Home Health          DME Arranged: N/A   HH Arranged: RN, Disease Management, PT, OT HH Agency: [facility] (formerly [employer]) Date [place]: 09/08/19 Time [place]: 1234 Representative spoke with at [place]: [place]   Readmission Risk Interventions Readmission Risk Prevention Plan 09/06/2019  Transportation Screening Complete  [place] or [place] Complete  Social Work Consult for [place] Planning/Counseling [place] Not Applicable  Medication Review Press photographer) Complete  Some recent data might be hidden

## 2019-09-09 ENCOUNTER — Telehealth: Payer: Self-pay | Admitting: Nephrology

## 2019-09-09 DIAGNOSIS — N2581 Secondary hyperparathyroidism of renal origin: Secondary | ICD-10-CM | POA: Diagnosis not present

## 2019-09-09 DIAGNOSIS — N186 End stage renal disease: Secondary | ICD-10-CM | POA: Diagnosis not present

## 2019-09-09 DIAGNOSIS — Z992 Dependence on renal dialysis: Secondary | ICD-10-CM | POA: Diagnosis not present

## 2019-09-09 LAB — CULTURE, BLOOD (ROUTINE X 2)
Culture: NO GROWTH
Culture: NO GROWTH
Special Requests: ADEQUATE

## 2019-09-09 NOTE — Telephone Encounter (Signed)
Transition of care contact from inpatient facility  Date of discharge: 09/08/19 Date of contact:  09/09/19 Method: Phone Spoke to: Patient and husband  Patient contacted to discuss transition of care from recent inpatient hospitalization. Patient was admitted to [facility] from 7/10 -09/08/19  with discharge diagnosis of MSSA bacteremia and symptomatic anemia.   Medication changes were reviewed. They have no concerns. Will continue antibiotics at dialysis.   Patient will follow up with his/her outpatient HD unit on: Today 09/09/19.

## 2019-09-11 ENCOUNTER — Other Ambulatory Visit: Payer: Self-pay

## 2019-09-11 NOTE — Patient Outreach (Signed)
[place] [facility]) Care Management  09/11/2019  CHALSEA DARKO 11/28/1950 [id number]   Referral Date: 09/11/19 Referral Source: [employer] Report Date of Discharge: 09/08/19 Facility: [place]: [facility]   Referral received.  No outreach warranted at this time.  Transition of Care calls being completed via EMMI. RN CM will outreach patient for any red flags received.    Plan: RN CM will close case.    Jone Baseman, RN, MSN [facility] Care Management Care Management Coordinator Direct Line [phone] Toll Free: [phone]  Fax: [phone]

## 2019-09-12 DIAGNOSIS — Z992 Dependence on renal dialysis: Secondary | ICD-10-CM | POA: Diagnosis not present

## 2019-09-12 DIAGNOSIS — E1122 Type 2 diabetes mellitus with diabetic chronic kidney disease: Secondary | ICD-10-CM | POA: Diagnosis not present

## 2019-09-12 DIAGNOSIS — D631 Anemia in chronic kidney disease: Secondary | ICD-10-CM | POA: Diagnosis not present

## 2019-09-12 DIAGNOSIS — E11319 Type 2 diabetes mellitus with unspecified diabetic retinopathy without macular edema: Secondary | ICD-10-CM | POA: Diagnosis not present

## 2019-09-12 DIAGNOSIS — E538 Deficiency of other specified B group vitamins: Secondary | ICD-10-CM | POA: Diagnosis not present

## 2019-09-12 DIAGNOSIS — T827XXA Infection and inflammatory reaction due to other cardiac and vascular devices, implants and grafts, initial encounter: Secondary | ICD-10-CM | POA: Diagnosis not present

## 2019-09-12 DIAGNOSIS — N186 End stage renal disease: Secondary | ICD-10-CM | POA: Diagnosis not present

## 2019-09-12 DIAGNOSIS — N2581 Secondary hyperparathyroidism of renal origin: Secondary | ICD-10-CM | POA: Diagnosis not present

## 2019-09-12 DIAGNOSIS — A4101 Sepsis due to Methicillin susceptible Staphylococcus aureus: Secondary | ICD-10-CM | POA: Diagnosis not present

## 2019-09-12 DIAGNOSIS — I120 Hypertensive chronic kidney disease with stage 5 chronic kidney disease or end stage renal disease: Secondary | ICD-10-CM | POA: Diagnosis not present

## 2019-09-12 DIAGNOSIS — E440 Moderate protein-calorie malnutrition: Secondary | ICD-10-CM | POA: Diagnosis not present

## 2019-09-13 ENCOUNTER — Ambulatory Visit: Payer: Medicare HMO

## 2019-09-13 ENCOUNTER — Ambulatory Visit: Payer: Medicare HMO | Admitting: Hematology and Oncology

## 2019-09-13 ENCOUNTER — Other Ambulatory Visit: Payer: Medicare HMO

## 2019-09-13 DIAGNOSIS — A4101 Sepsis due to Methicillin susceptible Staphylococcus aureus: Secondary | ICD-10-CM | POA: Diagnosis not present

## 2019-09-13 DIAGNOSIS — E11319 Type 2 diabetes mellitus with unspecified diabetic retinopathy without macular edema: Secondary | ICD-10-CM | POA: Diagnosis not present

## 2019-09-13 DIAGNOSIS — E538 Deficiency of other specified B group vitamins: Secondary | ICD-10-CM | POA: Diagnosis not present

## 2019-09-13 DIAGNOSIS — I120 Hypertensive chronic kidney disease with stage 5 chronic kidney disease or end stage renal disease: Secondary | ICD-10-CM | POA: Diagnosis not present

## 2019-09-13 DIAGNOSIS — E440 Moderate protein-calorie malnutrition: Secondary | ICD-10-CM | POA: Diagnosis not present

## 2019-09-13 DIAGNOSIS — N186 End stage renal disease: Secondary | ICD-10-CM | POA: Diagnosis not present

## 2019-09-13 DIAGNOSIS — E1122 Type 2 diabetes mellitus with diabetic chronic kidney disease: Secondary | ICD-10-CM | POA: Diagnosis not present

## 2019-09-13 DIAGNOSIS — D631 Anemia in chronic kidney disease: Secondary | ICD-10-CM | POA: Diagnosis not present

## 2019-09-13 DIAGNOSIS — T827XXA Infection and inflammatory reaction due to other cardiac and vascular devices, implants and grafts, initial encounter: Secondary | ICD-10-CM | POA: Diagnosis not present

## 2019-09-14 DIAGNOSIS — N186 End stage renal disease: Secondary | ICD-10-CM | POA: Diagnosis not present

## 2019-09-14 DIAGNOSIS — N2581 Secondary hyperparathyroidism of renal origin: Secondary | ICD-10-CM | POA: Diagnosis not present

## 2019-09-14 DIAGNOSIS — Z992 Dependence on renal dialysis: Secondary | ICD-10-CM | POA: Diagnosis not present

## 2019-09-15 DIAGNOSIS — E11319 Type 2 diabetes mellitus with unspecified diabetic retinopathy without macular edema: Secondary | ICD-10-CM | POA: Diagnosis not present

## 2019-09-15 DIAGNOSIS — I120 Hypertensive chronic kidney disease with stage 5 chronic kidney disease or end stage renal disease: Secondary | ICD-10-CM | POA: Diagnosis not present

## 2019-09-15 DIAGNOSIS — D631 Anemia in chronic kidney disease: Secondary | ICD-10-CM | POA: Diagnosis not present

## 2019-09-15 DIAGNOSIS — E538 Deficiency of other specified B group vitamins: Secondary | ICD-10-CM | POA: Diagnosis not present

## 2019-09-15 DIAGNOSIS — A4101 Sepsis due to Methicillin susceptible Staphylococcus aureus: Secondary | ICD-10-CM | POA: Diagnosis not present

## 2019-09-15 DIAGNOSIS — N186 End stage renal disease: Secondary | ICD-10-CM | POA: Diagnosis not present

## 2019-09-15 DIAGNOSIS — T827XXA Infection and inflammatory reaction due to other cardiac and vascular devices, implants and grafts, initial encounter: Secondary | ICD-10-CM | POA: Diagnosis not present

## 2019-09-15 DIAGNOSIS — E1122 Type 2 diabetes mellitus with diabetic chronic kidney disease: Secondary | ICD-10-CM | POA: Diagnosis not present

## 2019-09-15 DIAGNOSIS — E440 Moderate protein-calorie malnutrition: Secondary | ICD-10-CM | POA: Diagnosis not present

## 2019-09-16 DIAGNOSIS — Z992 Dependence on renal dialysis: Secondary | ICD-10-CM | POA: Diagnosis not present

## 2019-09-16 DIAGNOSIS — N2581 Secondary hyperparathyroidism of renal origin: Secondary | ICD-10-CM | POA: Diagnosis not present

## 2019-09-16 DIAGNOSIS — N186 End stage renal disease: Secondary | ICD-10-CM | POA: Diagnosis not present

## 2019-09-19 DIAGNOSIS — N2581 Secondary hyperparathyroidism of renal origin: Secondary | ICD-10-CM | POA: Diagnosis not present

## 2019-09-19 DIAGNOSIS — E538 Deficiency of other specified B group vitamins: Secondary | ICD-10-CM | POA: Diagnosis not present

## 2019-09-19 DIAGNOSIS — D631 Anemia in chronic kidney disease: Secondary | ICD-10-CM | POA: Diagnosis not present

## 2019-09-19 DIAGNOSIS — E11319 Type 2 diabetes mellitus with unspecified diabetic retinopathy without macular edema: Secondary | ICD-10-CM | POA: Diagnosis not present

## 2019-09-19 DIAGNOSIS — I120 Hypertensive chronic kidney disease with stage 5 chronic kidney disease or end stage renal disease: Secondary | ICD-10-CM | POA: Diagnosis not present

## 2019-09-19 DIAGNOSIS — Z992 Dependence on renal dialysis: Secondary | ICD-10-CM | POA: Diagnosis not present

## 2019-09-19 DIAGNOSIS — E1122 Type 2 diabetes mellitus with diabetic chronic kidney disease: Secondary | ICD-10-CM | POA: Diagnosis not present

## 2019-09-19 DIAGNOSIS — T827XXA Infection and inflammatory reaction due to other cardiac and vascular devices, implants and grafts, initial encounter: Secondary | ICD-10-CM | POA: Diagnosis not present

## 2019-09-19 DIAGNOSIS — E440 Moderate protein-calorie malnutrition: Secondary | ICD-10-CM | POA: Diagnosis not present

## 2019-09-19 DIAGNOSIS — N186 End stage renal disease: Secondary | ICD-10-CM | POA: Diagnosis not present

## 2019-09-19 DIAGNOSIS — A4101 Sepsis due to Methicillin susceptible Staphylococcus aureus: Secondary | ICD-10-CM | POA: Diagnosis not present

## 2019-09-20 DIAGNOSIS — E11319 Type 2 diabetes mellitus with unspecified diabetic retinopathy without macular edema: Secondary | ICD-10-CM | POA: Diagnosis not present

## 2019-09-20 DIAGNOSIS — A4101 Sepsis due to Methicillin susceptible Staphylococcus aureus: Secondary | ICD-10-CM | POA: Diagnosis not present

## 2019-09-20 DIAGNOSIS — I200 Unstable angina: Secondary | ICD-10-CM | POA: Diagnosis not present

## 2019-09-20 DIAGNOSIS — T827XXA Infection and inflammatory reaction due to other cardiac and vascular devices, implants and grafts, initial encounter: Secondary | ICD-10-CM | POA: Diagnosis not present

## 2019-09-20 DIAGNOSIS — E1122 Type 2 diabetes mellitus with diabetic chronic kidney disease: Secondary | ICD-10-CM | POA: Diagnosis not present

## 2019-09-20 DIAGNOSIS — N186 End stage renal disease: Secondary | ICD-10-CM | POA: Diagnosis not present

## 2019-09-20 DIAGNOSIS — D631 Anemia in chronic kidney disease: Secondary | ICD-10-CM | POA: Diagnosis not present

## 2019-09-21 DIAGNOSIS — Z992 Dependence on renal dialysis: Secondary | ICD-10-CM | POA: Diagnosis not present

## 2019-09-21 DIAGNOSIS — N186 End stage renal disease: Secondary | ICD-10-CM | POA: Diagnosis not present

## 2019-09-21 DIAGNOSIS — N2581 Secondary hyperparathyroidism of renal origin: Secondary | ICD-10-CM | POA: Diagnosis not present

## 2019-09-23 DIAGNOSIS — N2581 Secondary hyperparathyroidism of renal origin: Secondary | ICD-10-CM | POA: Diagnosis not present

## 2019-09-23 DIAGNOSIS — N186 End stage renal disease: Secondary | ICD-10-CM | POA: Diagnosis not present

## 2019-09-23 DIAGNOSIS — E1129 Type 2 diabetes mellitus with other diabetic kidney complication: Secondary | ICD-10-CM | POA: Diagnosis not present

## 2019-09-23 DIAGNOSIS — Z992 Dependence on renal dialysis: Secondary | ICD-10-CM | POA: Diagnosis not present

## 2019-09-26 DIAGNOSIS — Z992 Dependence on renal dialysis: Secondary | ICD-10-CM | POA: Diagnosis not present

## 2019-09-26 DIAGNOSIS — N2581 Secondary hyperparathyroidism of renal origin: Secondary | ICD-10-CM | POA: Diagnosis not present

## 2019-09-26 DIAGNOSIS — N186 End stage renal disease: Secondary | ICD-10-CM | POA: Diagnosis not present

## 2019-09-28 DIAGNOSIS — E440 Moderate protein-calorie malnutrition: Secondary | ICD-10-CM | POA: Diagnosis not present

## 2019-09-28 DIAGNOSIS — A4101 Sepsis due to Methicillin susceptible Staphylococcus aureus: Secondary | ICD-10-CM | POA: Diagnosis not present

## 2019-09-28 DIAGNOSIS — E538 Deficiency of other specified B group vitamins: Secondary | ICD-10-CM | POA: Diagnosis not present

## 2019-09-28 DIAGNOSIS — E1122 Type 2 diabetes mellitus with diabetic chronic kidney disease: Secondary | ICD-10-CM | POA: Diagnosis not present

## 2019-09-28 DIAGNOSIS — D631 Anemia in chronic kidney disease: Secondary | ICD-10-CM | POA: Diagnosis not present

## 2019-09-28 DIAGNOSIS — N186 End stage renal disease: Secondary | ICD-10-CM | POA: Diagnosis not present

## 2019-09-28 DIAGNOSIS — T827XXA Infection and inflammatory reaction due to other cardiac and vascular devices, implants and grafts, initial encounter: Secondary | ICD-10-CM | POA: Diagnosis not present

## 2019-09-28 DIAGNOSIS — N2581 Secondary hyperparathyroidism of renal origin: Secondary | ICD-10-CM | POA: Diagnosis not present

## 2019-09-28 DIAGNOSIS — E11319 Type 2 diabetes mellitus with unspecified diabetic retinopathy without macular edema: Secondary | ICD-10-CM | POA: Diagnosis not present

## 2019-09-28 DIAGNOSIS — I120 Hypertensive chronic kidney disease with stage 5 chronic kidney disease or end stage renal disease: Secondary | ICD-10-CM | POA: Diagnosis not present

## 2019-09-28 DIAGNOSIS — Z992 Dependence on renal dialysis: Secondary | ICD-10-CM | POA: Diagnosis not present

## 2019-09-30 DIAGNOSIS — Z992 Dependence on renal dialysis: Secondary | ICD-10-CM | POA: Diagnosis not present

## 2019-09-30 DIAGNOSIS — N186 End stage renal disease: Secondary | ICD-10-CM | POA: Diagnosis not present

## 2019-09-30 DIAGNOSIS — N2581 Secondary hyperparathyroidism of renal origin: Secondary | ICD-10-CM | POA: Diagnosis not present

## 2019-10-03 DIAGNOSIS — N186 End stage renal disease: Secondary | ICD-10-CM | POA: Diagnosis not present

## 2019-10-03 DIAGNOSIS — Z992 Dependence on renal dialysis: Secondary | ICD-10-CM | POA: Diagnosis not present

## 2019-10-03 DIAGNOSIS — N2581 Secondary hyperparathyroidism of renal origin: Secondary | ICD-10-CM | POA: Diagnosis not present

## 2019-10-05 DIAGNOSIS — E1122 Type 2 diabetes mellitus with diabetic chronic kidney disease: Secondary | ICD-10-CM | POA: Diagnosis not present

## 2019-10-05 DIAGNOSIS — E440 Moderate protein-calorie malnutrition: Secondary | ICD-10-CM | POA: Diagnosis not present

## 2019-10-05 DIAGNOSIS — N2581 Secondary hyperparathyroidism of renal origin: Secondary | ICD-10-CM | POA: Diagnosis not present

## 2019-10-05 DIAGNOSIS — T827XXA Infection and inflammatory reaction due to other cardiac and vascular devices, implants and grafts, initial encounter: Secondary | ICD-10-CM | POA: Diagnosis not present

## 2019-10-05 DIAGNOSIS — D631 Anemia in chronic kidney disease: Secondary | ICD-10-CM | POA: Diagnosis not present

## 2019-10-05 DIAGNOSIS — E538 Deficiency of other specified B group vitamins: Secondary | ICD-10-CM | POA: Diagnosis not present

## 2019-10-05 DIAGNOSIS — Z992 Dependence on renal dialysis: Secondary | ICD-10-CM | POA: Diagnosis not present

## 2019-10-05 DIAGNOSIS — N186 End stage renal disease: Secondary | ICD-10-CM | POA: Diagnosis not present

## 2019-10-05 DIAGNOSIS — I120 Hypertensive chronic kidney disease with stage 5 chronic kidney disease or end stage renal disease: Secondary | ICD-10-CM | POA: Diagnosis not present

## 2019-10-05 DIAGNOSIS — A4101 Sepsis due to Methicillin susceptible Staphylococcus aureus: Secondary | ICD-10-CM | POA: Diagnosis not present

## 2019-10-05 DIAGNOSIS — E11319 Type 2 diabetes mellitus with unspecified diabetic retinopathy without macular edema: Secondary | ICD-10-CM | POA: Diagnosis not present

## 2019-10-06 DIAGNOSIS — E538 Deficiency of other specified B group vitamins: Secondary | ICD-10-CM | POA: Diagnosis not present

## 2019-10-06 DIAGNOSIS — T827XXA Infection and inflammatory reaction due to other cardiac and vascular devices, implants and grafts, initial encounter: Secondary | ICD-10-CM | POA: Diagnosis not present

## 2019-10-06 DIAGNOSIS — I120 Hypertensive chronic kidney disease with stage 5 chronic kidney disease or end stage renal disease: Secondary | ICD-10-CM | POA: Diagnosis not present

## 2019-10-06 DIAGNOSIS — E440 Moderate protein-calorie malnutrition: Secondary | ICD-10-CM | POA: Diagnosis not present

## 2019-10-06 DIAGNOSIS — A4101 Sepsis due to Methicillin susceptible Staphylococcus aureus: Secondary | ICD-10-CM | POA: Diagnosis not present

## 2019-10-06 DIAGNOSIS — N186 End stage renal disease: Secondary | ICD-10-CM | POA: Diagnosis not present

## 2019-10-06 DIAGNOSIS — D631 Anemia in chronic kidney disease: Secondary | ICD-10-CM | POA: Diagnosis not present

## 2019-10-06 DIAGNOSIS — E1122 Type 2 diabetes mellitus with diabetic chronic kidney disease: Secondary | ICD-10-CM | POA: Diagnosis not present

## 2019-10-06 DIAGNOSIS — E11319 Type 2 diabetes mellitus with unspecified diabetic retinopathy without macular edema: Secondary | ICD-10-CM | POA: Diagnosis not present

## 2019-10-07 DIAGNOSIS — N186 End stage renal disease: Secondary | ICD-10-CM | POA: Diagnosis not present

## 2019-10-07 DIAGNOSIS — Z992 Dependence on renal dialysis: Secondary | ICD-10-CM | POA: Diagnosis not present

## 2019-10-07 DIAGNOSIS — N2581 Secondary hyperparathyroidism of renal origin: Secondary | ICD-10-CM | POA: Diagnosis not present

## 2019-10-08 ENCOUNTER — Other Ambulatory Visit: Payer: Self-pay

## 2019-10-08 ENCOUNTER — Emergency Department (HOSPITAL_COMMUNITY)
Admission: EM | Admit: 2019-10-08 | Discharge: 2019-10-09 | Disposition: A | Discharge status: Home / Self Care | Payer: Medicare HMO | Attending: Emergency Medicine | Admitting: Emergency Medicine

## 2019-10-08 DIAGNOSIS — Z79899 Other long term (current) drug therapy: Secondary | ICD-10-CM | POA: Diagnosis not present

## 2019-10-08 DIAGNOSIS — I129 Hypertensive chronic kidney disease with stage 1 through stage 4 chronic kidney disease, or unspecified chronic kidney disease: Secondary | ICD-10-CM | POA: Insufficient documentation

## 2019-10-08 DIAGNOSIS — Z992 Dependence on renal dialysis: Secondary | ICD-10-CM | POA: Diagnosis not present

## 2019-10-08 DIAGNOSIS — T50901A Poisoning by unspecified drugs, medicaments and biological substances, accidental (unintentional), initial encounter: Secondary | ICD-10-CM

## 2019-10-08 DIAGNOSIS — I10 Essential (primary) hypertension: Secondary | ICD-10-CM | POA: Diagnosis not present

## 2019-10-08 DIAGNOSIS — T8249XA Other complication of vascular dialysis catheter, initial encounter: Secondary | ICD-10-CM | POA: Diagnosis not present

## 2019-10-08 DIAGNOSIS — E11319 Type 2 diabetes mellitus with unspecified diabetic retinopathy without macular edema: Secondary | ICD-10-CM | POA: Diagnosis not present

## 2019-10-08 DIAGNOSIS — N189 Chronic kidney disease, unspecified: Secondary | ICD-10-CM | POA: Diagnosis not present

## 2019-10-08 NOTE — ED Triage Notes (Signed)
Patient states she was on the way to church with her husband this morning. Around ten minutes ago they had stopped to get food, patient saw her pill bottle in the car and thought it was her daily medications all together in one bottle. Patient took all of the pills in the bottle. Patient was told by husband that the pills were actually carvedilol (as labeled on the bottle). There were 8 pills total of 3.$RemoveBe'125mg'CXxTaNCsQ$  each. Patient stable in ED. BP in triage 166/71

## 2019-10-08 NOTE — ED Provider Notes (Signed)
[facility] DEPT Provider Note   CSN: [id number] Arrival date & time: 10/08/19  1008     History Chief Complaint  Patient presents with   Drug Overdose    carvedilol     Bailey [name] is a 69 y.o. female.  HPI Patient was on her way to church with her husband.  About 10 minutes prior to arrival to the emergency department, they were stopping to get food.  The patient saw one of her pill bottles in the car and thought it was her combined daily medications.  Per reports she took all of the pills from the bottle.  The pill bottle was her carvedilol 3.125 mg.  Her husband thought there were 8 tablets left in the bottle.  By calculating the dispense date and how many pills should be left, it would be 4 pills.  Patient denies she has any symptoms.  She reports she feels fine.  She reports she does not think she could have taken all that many pills because she went to been able to tolerate taking them all at once.  She really does not recall how many were there, she just thought she was supposed to take them.    Past Medical History:  Diagnosis Date   Anemia    patient preference - stopped iron    ARF (acute renal failure) ([place]) 04/2016   dehydration   Chronic [name] disease    stage 5   Depression    Diabetes mellitus    Type II - pt preference - stopped lantus   Diabetic retinopathy ([place])    Diverticulitis    History of [name] stones    passed- 6   Hypertension    Osteomyelitis ([place])    Vitamin B 12 deficiency 06/09/2017   Vitamin D deficiency    Wears partial dentures    upper    Patient Active Problem List   Diagnosis Date Noted   Malnutrition of moderate degree 09/05/2019   Hypokalemia 09/02/2019   Fever of unknown origin 09/02/2019   ESRD on dialysis ([place]) 08/20/2019   Benign essential HTN 08/20/2019   Symptomatic anemia 08/20/2019   Thrombocytopenia ([place]) 08/20/2019   CKD (chronic [name] disease) 05/23/2019    Type II diabetes mellitus, uncontrolled ([place]) 12/07/2017   History of anemia due to chronic [name] disease 10/11/2017   Vitamin B 12 deficiency 06/09/2017   Great toe amputation status, right 09/04/2016   Osteomyelitis of toe of right foot ([place])    Acute renal failure (ARF) ([place]) 05/10/2016   SBO (small bowel obstruction) ([place]) 05/08/2016   Diverticular disease of jejunum s/p resection 05/09/2016 05/08/2016   Normocytic anemia 05/05/2016    Past Surgical History:  Procedure Laterality Date   AMPUTATION Right 08/14/2016   Procedure: RIGHT 1ST RAY AMPUTATION MID-SHAFT;  Surgeon: Newt Minion, MD;  Location: [facility];  Service: Orthopedics;  Laterality: Right;   BASCILIC VEIN TRANSPOSITION Left 06/08/2019   Procedure: LEFT BRACHIOCEPHALIC VEIN CREATION;  Surgeon: Marty Heck, MD;  Location: [facility];  Service: Vascular;  Laterality: Left;   INCISION AND DRAINAGE ABSCESS Left 02/11/2014   Procedure: INCISION AND DRAINAGE ABSCESS Left Buttock;  Surgeon: Jackolyn Confer, MD;  Location: [facility];  Service: General;  Laterality: Left;   INSERTION OF DIALYSIS CATHETER Left 09/07/2019   Procedure: INSERTION OF LEFT INTERNAL JUGULAR DIALYSIS CATHETER;  Surgeon: Rosetta Posner, MD;  Location: [facility];  Service: Vascular;  Laterality: Left;   IR REMOVAL TUN CV  CATH W/O FL  09/04/2019   LAPAROSCOPIC SMALL BOWEL RESECTION N/A 05/09/2016   Procedure: LAPAROSCOPIC SMALL BOWEL RESECTION;  Surgeon: Karie Soda, MD;  Location: [facility];  Service: General;  Laterality: N/A;   LAPAROSCOPY N/A 05/09/2016   Procedure: LAPAROSCOPY DIAGNOSTIC, LYSIS OF ADHESIONS, SMALL BOWEL RESECTION X 2;  Surgeon: Karie Soda, MD;  Location: [facility];  Service: General;  Laterality: N/A;   TOE AMPUTATION     left foot great toe     OB History   No obstetric history on file.     Family History  Problem Relation Age of Onset   Diabetes Mother    Asthma Sister     Social History   Tobacco Use   Smoking  status: Never Smoker   Smokeless tobacco: Never Used  Vaping Use   Vaping Use: Never used  Substance Use Topics   Alcohol use: No    Alcohol/week: 0.0 standard drinks   Drug use: No    Home Medications Prior to Admission medications   Medication Sig Start Date End Date Taking? Authorizing Provider  acetaminophen (TYLENOL) 500 MG tablet Take 1,000 mg by mouth every 6 (six) hours as needed for fever or headache (pain).     [provider]  amlodipine-atorvastatin (CADUET) 10-10 MG tablet Take 1 tablet by mouth daily. 2.5 mg at night per husband    [provider]  carvedilol (COREG) 3.125 MG tablet Take 1 tablet (3.125 mg total) by mouth 2 (two) times daily with a meal. 09/08/19   [name], MD  ceFAZolin (ANCEF) 2-4 GM/100ML-% IVPB Inject 100 mLs (2 g total) into the vein Every Tuesday,Thursday,and Saturday with dialysis. 09/09/19 10/16/19  [name], MD  Cholecalciferol (VITAMIN D-3) 125 MCG (5000 UT) TABS Take 5,000 Units by mouth 2 (two) times daily.    [provider]  cyanocobalamin (,VITAMIN B-12,) 1000 MCG/ML injection Inject 1,000 mcg into the muscle every 30 (thirty) days.    [provider]  escitalopram (LEXAPRO) 5 MG tablet Take 5 mg by mouth daily as needed (anxiety).    [provider]  sodium bicarbonate 650 MG tablet Take 650 mg by mouth 2 (two) times daily.    [provider]  Vitamin D, Ergocalciferol, (DRISDOL) 1.25 MG (50000 UNIT) CAPS capsule Take 50,000 Units by mouth every Sunday.     [provider]    Allergies    Patient has no known allergies.  Review of Systems   Review of Systems 10 systems reviewed and negative except as per HPI Physical Exam Updated Vital Signs BP 132/62 (BP Location: Left Arm)    Pulse 73    Temp 98.8 F (37.1 C) (Oral)    Resp 17    Ht 5\' 8"  (1.727 m)    Wt 52.6 kg    LMP  (LMP Unknown)    SpO2 100%    BMI 17.63 kg/m   Physical Exam Constitutional:       Comments: Patient is alert and nontoxic.  She is well in appearance.  No respiratory distress.  HENT:     Head: Normocephalic and atraumatic.  Eyes:     Extraocular Movements: Extraocular movements intact.  Cardiovascular:     Rate and Rhythm: Normal rate and regular rhythm.  Pulmonary:     Effort: Pulmonary effort is normal.     Breath sounds: Normal breath sounds.     Comments: Patient has an access port in the left anterior chest wall. Abdominal:  General: There is no distension.     Palpations: Abdomen is soft.     Tenderness: There is no abdominal tenderness.  Musculoskeletal:        General: No swelling or tenderness. Normal range of motion.  Skin:    General: Skin is warm and dry.  Neurological:     General: No focal deficit present.     Coordination: Coordination normal.  Psychiatric:        Mood and Affect: Mood normal.     ED Results / Procedures / Treatments   Labs (all labs ordered are listed, but only abnormal results are displayed) Labs Reviewed - No data to display  EKG EKG will not import from MUSE.  EKG is sinus rhythm with normal intervals.  No acute ischemic changes.  Radiology No results found.  Procedures Procedures (including critical care time)  Medications Ordered in ED Medications - No data to display  ED Course  I have reviewed the triage vital signs and the nursing notes.  Pertinent labs & imaging results that were available during my care of the patient were reviewed by me and considered in my medical decision making (see chart for details).    MDM Rules/Calculators/A&P                           Patient has been observed for 4 hours.  Patient took additional dose of her carvedilol accidentally.  If 8 tablets were present, maximum dose would have been 25 mg.  Patient typically takes 3.125 mg twice daily.  There has been no observed adverse effect.  She has maintained sinus rhythm in the 70s without any significant conduction delay or  hypotension.  At this time patient is stable for discharge with precautionary statements for home.   Final Clinical Impression(s) / ED Diagnoses Final diagnoses:  Medication overdose, accidental or unintentional, initial encounter    Rx / DC Orders ED Discharge Orders    None       Charlesetta Shanks, MD 10/08/19 [phone]

## 2019-10-08 NOTE — Discharge Instructions (Addendum)
1.  At this time there does not appear to be any adverse effects from taking too much of your carvedilol.  Continue to monitor for low heart rate with associated dizziness or weakness.  Monitor blood pressures at home until this evening.  Pressures and heart rate have been stable.  If they remain normal this evening, continue all of your regular medications as prescribed.

## 2019-10-08 NOTE — ED Notes (Signed)
Dr Johnney Killian evaluating patient. MD states the dose ingested is not enough to be toxic. Monitor patient at this time.

## 2019-10-10 ENCOUNTER — Other Ambulatory Visit: Payer: Self-pay

## 2019-10-10 ENCOUNTER — Encounter: Payer: Self-pay | Admitting: Vascular Surgery

## 2019-10-10 ENCOUNTER — Ambulatory Visit (INDEPENDENT_AMBULATORY_CARE_PROVIDER_SITE_OTHER): Payer: Medicare HMO | Admitting: Vascular Surgery

## 2019-10-10 VITALS — BP 176/77 | HR 72 | Temp 98.2°F | Resp 20 | Ht 68.0 in | Wt 115.0 lb

## 2019-10-10 DIAGNOSIS — E440 Moderate protein-calorie malnutrition: Secondary | ICD-10-CM | POA: Diagnosis not present

## 2019-10-10 DIAGNOSIS — A4101 Sepsis due to Methicillin susceptible Staphylococcus aureus: Secondary | ICD-10-CM | POA: Diagnosis not present

## 2019-10-10 DIAGNOSIS — E538 Deficiency of other specified B group vitamins: Secondary | ICD-10-CM | POA: Diagnosis not present

## 2019-10-10 DIAGNOSIS — E1122 Type 2 diabetes mellitus with diabetic chronic kidney disease: Secondary | ICD-10-CM | POA: Diagnosis not present

## 2019-10-10 DIAGNOSIS — E11319 Type 2 diabetes mellitus with unspecified diabetic retinopathy without macular edema: Secondary | ICD-10-CM | POA: Diagnosis not present

## 2019-10-10 DIAGNOSIS — N186 End stage renal disease: Secondary | ICD-10-CM | POA: Diagnosis not present

## 2019-10-10 DIAGNOSIS — Z992 Dependence on renal dialysis: Secondary | ICD-10-CM | POA: Diagnosis not present

## 2019-10-10 DIAGNOSIS — D631 Anemia in chronic kidney disease: Secondary | ICD-10-CM | POA: Diagnosis not present

## 2019-10-10 DIAGNOSIS — T827XXA Infection and inflammatory reaction due to other cardiac and vascular devices, implants and grafts, initial encounter: Secondary | ICD-10-CM | POA: Diagnosis not present

## 2019-10-10 DIAGNOSIS — I120 Hypertensive chronic kidney disease with stage 5 chronic kidney disease or end stage renal disease: Secondary | ICD-10-CM | POA: Diagnosis not present

## 2019-10-10 NOTE — Progress Notes (Signed)
[facility]  Patient name: Bailey [name] MRN: [id number] DOB: 1950/05/10 Sex: female  REASON FOR VISIT: Continued follow-up left upper arm AV fistula  HPI: Bailey [name] is a 69 y.o. female here today for follow-up.  She had fistula placement 4 months ago by Dr.Dickson in her left upper arm.  She presented with renal failure and had initiation of dialysis 1 month ago.  She had infiltration on her fistula attempt and therefore a left IJ tunneled hemodialysis catheter was placed.  She is here today for evaluation  Past Medical History:  Diagnosis Date  . Anemia    patient preference - stopped iron   . ARF (acute renal failure) ([place]) 04/2016   dehydration  . Chronic kidney disease    stage 5  . Depression   . Diabetes mellitus    Type II - pt preference - stopped lantus  . Diabetic retinopathy ([place])   . Diverticulitis   . History of kidney stones    passed- 6  . Hypertension   . Osteomyelitis ([place])   . Vitamin B 12 deficiency 06/09/2017  . Vitamin D deficiency   . Wears partial dentures    upper    Family History  Problem Relation Age of Onset  . Diabetes Mother   . Asthma Sister     SOCIAL HISTORY: Social History   Tobacco Use  . Smoking status: Never Smoker  . Smokeless tobacco: Never Used  Substance Use Topics  . Alcohol use: No    Alcohol/week: 0.0 standard drinks    No Known Allergies  Current Outpatient Medications  Medication Sig Dispense Refill  . acetaminophen (TYLENOL) 500 MG tablet Take 1,000 mg by mouth every 6 (six) hours as needed for fever or headache (pain).     Marland Kitchen amlodipine-atorvastatin (CADUET) 10-10 MG tablet Take 1 tablet by mouth daily. 2.5 mg at night per husband    . carvedilol (COREG) 3.125 MG tablet Take 1 tablet (3.125 mg total) by mouth 2 (two) times daily with a meal. 60 tablet 1  . ceFAZolin (ANCEF) 2-4 GM/100ML-% IVPB Inject 100 mLs (2 g total) into the vein Every  Tuesday,Thursday,and Saturday with dialysis. 1 each 2  . Cholecalciferol (VITAMIN D-3) 125 MCG (5000 UT) TABS Take 5,000 Units by mouth 2 (two) times daily.    . cyanocobalamin (,VITAMIN B-12,) 1000 MCG/ML injection Inject 1,000 mcg into the muscle every 30 (thirty) days.    Marland Kitchen escitalopram (LEXAPRO) 5 MG tablet Take 5 mg by mouth daily as needed (anxiety).    Marland Kitchen NIFEdipine (ADALAT CC) 30 MG 24 hr tablet Take 30 mg by mouth daily.    . sodium bicarbonate 650 MG tablet Take 650 mg by mouth 2 (two) times daily.    . Vitamin D, Ergocalciferol, (DRISDOL) 1.25 MG (50000 UNIT) CAPS capsule Take 50,000 Units by mouth every Sunday.      No current facility-administered medications for this visit.    REVIEW OF SYSTEMS:  $RemoveB'[X]'wwXsmrOt$  denotes positive finding, $RemoveBeforeDEI'[ ]'[id number]$  denotes negative finding Cardiac  Comments:  Chest pain or chest pressure:    Shortness of breath upon exertion:    Short of breath when lying flat:    Irregular heart rhythm:        Vascular    Pain in calf, thigh, or hip brought on by ambulation:    Pain in feet at night that wakes you up from your sleep:     Blood clot in your  veins:    Leg swelling:           PHYSICAL EXAM: Vitals:   10/10/19 1313  BP: (!) 176/77  Pulse: 72  Resp: 20  Temp: 98.2 F (36.8 C)  SpO2: 98%  Weight: 115 lb (52.2 kg)  Height: $Remove'5\' 8"'quvmWyd$  (1.727 m)    GENERAL: The patient is a well-nourished female, in no acute distress. The vital signs are documented above. CARDIOVASCULAR: Fistula is asked very nicely matured.  Her bruising has completely resolved.  She has a very large left upper arm brachiocephalic fistula with good size maturation throughout her upper arm.  She has an excellent thrill.  The vein is straight and superficial PULMONARY: There is good air exchange  MUSCULOSKELETAL: There are no major deformities or cyanosis. NEUROLOGIC: No focal weakness or paresthesias are detected. SKIN: There are no ulcers or rashes noted. PSYCHIATRIC: The patient has a  normal affect.  DATA:  None  MEDICAL ISSUES: Now 4 months out with resolution of her infiltration.  I have recommended that she use the fistula for access.  Assuming she does not have any more infiltration events, she can have a catheter removed.    Rosetta Posner, MD FACS [facility] Tel [phone] Pager [phone]

## 2019-10-11 DIAGNOSIS — N186 End stage renal disease: Secondary | ICD-10-CM | POA: Diagnosis not present

## 2019-10-11 DIAGNOSIS — Z992 Dependence on renal dialysis: Secondary | ICD-10-CM | POA: Diagnosis not present

## 2019-10-11 DIAGNOSIS — N2581 Secondary hyperparathyroidism of renal origin: Secondary | ICD-10-CM | POA: Diagnosis not present

## 2019-10-12 DIAGNOSIS — E538 Deficiency of other specified B group vitamins: Secondary | ICD-10-CM | POA: Diagnosis not present

## 2019-10-12 DIAGNOSIS — T827XXA Infection and inflammatory reaction due to other cardiac and vascular devices, implants and grafts, initial encounter: Secondary | ICD-10-CM | POA: Diagnosis not present

## 2019-10-12 DIAGNOSIS — N2581 Secondary hyperparathyroidism of renal origin: Secondary | ICD-10-CM | POA: Diagnosis not present

## 2019-10-12 DIAGNOSIS — Z992 Dependence on renal dialysis: Secondary | ICD-10-CM | POA: Diagnosis not present

## 2019-10-12 DIAGNOSIS — E1122 Type 2 diabetes mellitus with diabetic chronic kidney disease: Secondary | ICD-10-CM | POA: Diagnosis not present

## 2019-10-12 DIAGNOSIS — D631 Anemia in chronic kidney disease: Secondary | ICD-10-CM | POA: Diagnosis not present

## 2019-10-12 DIAGNOSIS — A4101 Sepsis due to Methicillin susceptible Staphylococcus aureus: Secondary | ICD-10-CM | POA: Diagnosis not present

## 2019-10-12 DIAGNOSIS — E11319 Type 2 diabetes mellitus with unspecified diabetic retinopathy without macular edema: Secondary | ICD-10-CM | POA: Diagnosis not present

## 2019-10-12 DIAGNOSIS — N186 End stage renal disease: Secondary | ICD-10-CM | POA: Diagnosis not present

## 2019-10-12 DIAGNOSIS — I120 Hypertensive chronic kidney disease with stage 5 chronic kidney disease or end stage renal disease: Secondary | ICD-10-CM | POA: Diagnosis not present

## 2019-10-12 DIAGNOSIS — E440 Moderate protein-calorie malnutrition: Secondary | ICD-10-CM | POA: Diagnosis not present

## 2019-10-14 DIAGNOSIS — Z992 Dependence on renal dialysis: Secondary | ICD-10-CM | POA: Diagnosis not present

## 2019-10-14 DIAGNOSIS — N2581 Secondary hyperparathyroidism of renal origin: Secondary | ICD-10-CM | POA: Diagnosis not present

## 2019-10-14 DIAGNOSIS — N186 End stage renal disease: Secondary | ICD-10-CM | POA: Diagnosis not present

## 2019-10-16 ENCOUNTER — Inpatient Hospital Stay: Payer: Medicare HMO | Admitting: Hematology and Oncology

## 2019-10-16 ENCOUNTER — Inpatient Hospital Stay: Payer: Medicare HMO

## 2019-10-17 DIAGNOSIS — Z992 Dependence on renal dialysis: Secondary | ICD-10-CM | POA: Diagnosis not present

## 2019-10-17 DIAGNOSIS — N186 End stage renal disease: Secondary | ICD-10-CM | POA: Diagnosis not present

## 2019-10-17 DIAGNOSIS — N2581 Secondary hyperparathyroidism of renal origin: Secondary | ICD-10-CM | POA: Diagnosis not present

## 2019-10-17 NOTE — Progress Notes (Signed)
Patient Care Team: [name], MD as PCP - General (Family Medicine) Michael Boston, MD as Consulting Physician (General Surgery) Sydnee Cabal, MD as Consulting Physician (Orthopedic Surgery) Elmarie Shiley, MD as Consulting Physician (Nephrology) Nicholas Lose, MD as Consulting Physician (Hematology and Oncology) [place]  DIAGNOSIS:    ICD-10-CM   1. History of anemia due to chronic kidney disease  N18.9 cyanocobalamin ((VITAMIN B-12)) injection 1,000 mcg   Z86.2     CHIEF COMPLIANT: Follow-up of anemiaand B12 deficiency  INTERVAL HISTORY: Bailey [name] is a 69 y.o. with above-mentioned history of anemiaof chronic kidney diseaseand B12 deficiency who is currently ontreatmentwith Retacrit.She was admitted from 09/02/19-09/08/19 after presenting to the ED for generalized weakness and issues related to her hemodialysis catheter. Labs showed Hg 5.8 and she received a blood transfusion.  She presents to the clinic todayfor follow-up.  Her hemoglobin today is excellent.  She feels much better.  She gets very tired after dialysis.  ALLERGIES:  has No Known Allergies.  MEDICATIONS:  Current Outpatient Medications  Medication Sig Dispense Refill  . acetaminophen (TYLENOL) 500 MG tablet Take 1,000 mg by mouth every 6 (six) hours as needed for fever or headache (pain).     Marland Kitchen amlodipine-atorvastatin (CADUET) 10-10 MG tablet Take 1 tablet by mouth daily. 2.5 mg at night per husband    . carvedilol (COREG) 3.125 MG tablet Take 1 tablet (3.125 mg total) by mouth 2 (two) times daily with a meal. 60 tablet 1  . Cholecalciferol (VITAMIN D-3) 125 MCG (5000 UT) TABS Take 5,000 Units by mouth 2 (two) times daily.    . cyanocobalamin (,VITAMIN B-12,) 1000 MCG/ML injection Inject 1,000 mcg into the muscle every 30 (thirty) days.    Marland Kitchen escitalopram (LEXAPRO) 5 MG tablet Take 5 mg by mouth daily as needed (anxiety).    Marland Kitchen NIFEdipine (ADALAT CC) 30 MG 24 hr tablet Take 30 mg by  mouth daily.    . sodium bicarbonate 650 MG tablet Take 650 mg by mouth 2 (two) times daily.    . Vitamin D, Ergocalciferol, (DRISDOL) 1.25 MG (50000 UNIT) CAPS capsule Take 50,000 Units by mouth every Sunday.      No current facility-administered medications for this visit.    PHYSICAL EXAMINATION: ECOG PERFORMANCE STATUS: 1 - Symptomatic but completely ambulatory  Vitals:   10/18/19 1550  BP: (!) 172/61  Pulse: 73  Resp: 17  Temp: 98.2 F (36.8 C)  SpO2: 100%   Filed Weights   10/18/19 1550  Weight: 117 lb 9.6 oz (53.3 kg)    LABORATORY DATA:  I have reviewed the data as listed CMP Latest Ref Rng & Units 10/18/2019 09/08/2019 09/07/2019  Glucose 70 - 99 mg/dL 214(H) 180(H) 114(H)  BUN 8 - 23 mg/dL 31(H) 19 51(H)  Creatinine 0.44 - 1.00 mg/dL 3.83(HH) 2.29(H) 4.44(H)  Sodium 135 - 145 mmol/L 140 136 137  Potassium 3.5 - 5.1 mmol/L 5.7(H) 3.4(L) 4.0  Chloride 98 - 111 mmol/L 100 98 102  CO2 22 - 32 mmol/L 32 28 24  Calcium 8.9 - 10.3 mg/dL 9.5 7.3(L) 7.8(L)  Total Protein 6.5 - 8.1 g/dL 6.7 - -  Total Bilirubin 0.3 - 1.2 mg/dL 0.3 - -  Alkaline Phos 38 - 126 U/L 254(H) - -  AST 15 - 41 U/L 18 - -  ALT 0 - 44 U/L <6 - -    Lab Results  Component Value Date   WBC 5.8 10/18/2019   HGB 11.3 (  L) 10/18/2019   HCT 37.0 10/18/2019   MCV 97.9 10/18/2019   PLT 127 (L) 10/18/2019   NEUTROABS 3.3 10/18/2019    ASSESSMENT & PLAN:  History of anemia due to chronic kidney disease 04/25/2018: Hemoglobin 9.8, MCV 89, platelets 129 08/09/2018: Hemoglobin 9.8, MCV 90.4, platelets 142 11/16/2018:Hemoglobin 7, MCV 94.7, platelets 188 01/02/2019: Hemoglobin 8.4, MCV 91.8, platelets 93 01/30/2019: Hemoglobin 11.8, platelets 125 02/13/2019: Hemoglobin 11.6, platelets 113 10/18/2019: Hemoglobin 11.3, platelets 127  Bone marrow biopsy: 01/20/2019: Hypercellular bone marrow 50 to 60% for age with trilineage hematopoiesis, several small lymphoid aggregates present. Mild dyspoietic changes  involving the red cells. No increase in blasts. Confirmed that these changes are related to chronic kidney disease. Flow cytometry: Negative, platelet clumping noted suggestive of pseudothrombocytopenia.  Current treatment: 1.B12 injections monthly,began in 05/2017 2.Retacrit injectionsevery 4 weeks if hemoglobin drops below 10, began in 10/2017 Received blood transfusion in the hospital   CURRENT THERAPY:  Retacrit, B12 Today she does not need Retacrit injection.  We will give her B12 shot and see her back in 2 months with labs.   No orders of the defined types were placed in this encounter.  The patient has a good understanding of the overall plan. she agrees with it. she will call with any problems that may develop before the next visit here.  Total time spent: 20 mins including face to face time and time spent for planning, charting and coordination of care  Nicholas Lose, MD 10/18/2019  I, Cloyde Reams Dorshimer, am acting as scribe for Dr. Nicholas Lose.  I have reviewed the above documentation for accuracy and completeness, and I agree with the above.

## 2019-10-18 ENCOUNTER — Inpatient Hospital Stay: Payer: Medicare HMO | Attending: Hematology and Oncology

## 2019-10-18 ENCOUNTER — Other Ambulatory Visit: Payer: Self-pay

## 2019-10-18 ENCOUNTER — Inpatient Hospital Stay: Payer: Medicare HMO

## 2019-10-18 ENCOUNTER — Inpatient Hospital Stay (HOSPITAL_BASED_OUTPATIENT_CLINIC_OR_DEPARTMENT_OTHER): Payer: Medicare HMO | Admitting: Hematology and Oncology

## 2019-10-18 DIAGNOSIS — Z992 Dependence on renal dialysis: Secondary | ICD-10-CM | POA: Insufficient documentation

## 2019-10-18 DIAGNOSIS — D631 Anemia in chronic kidney disease: Secondary | ICD-10-CM | POA: Insufficient documentation

## 2019-10-18 DIAGNOSIS — N189 Chronic kidney disease, unspecified: Secondary | ICD-10-CM | POA: Insufficient documentation

## 2019-10-18 DIAGNOSIS — Z862 Personal history of diseases of the blood and blood-forming organs and certain disorders involving the immune mechanism: Secondary | ICD-10-CM | POA: Diagnosis not present

## 2019-10-18 DIAGNOSIS — E538 Deficiency of other specified B group vitamins: Secondary | ICD-10-CM | POA: Insufficient documentation

## 2019-10-18 DIAGNOSIS — I129 Hypertensive chronic kidney disease with stage 1 through stage 4 chronic kidney disease, or unspecified chronic kidney disease: Secondary | ICD-10-CM | POA: Diagnosis not present

## 2019-10-18 LAB — CMP (CANCER CENTER ONLY)
ALT: <6 U/L (ref 0–44)
AST: 18 U/L (ref 15–41)
Albumin: 2.9 g/dL — ABNORMAL LOW (ref 3.5–5.0)
Alkaline Phosphatase: 254 U/L — ABNORMAL HIGH (ref 38–126)
Anion gap: 8 (ref 5–15)
BUN: 31 mg/dL — ABNORMAL HIGH (ref 8–23)
CO2: 32 mmol/L (ref 22–32)
Calcium: 9.5 mg/dL (ref 8.9–10.3)
Chloride: 100 mmol/L (ref 98–111)
Creatinine: 3.83 mg/dL (ref 0.44–1.00)
GFR, Est AFR Am: 13 mL/min — ABNORMAL LOW (ref >60)
GFR, Estimated: 11 mL/min — ABNORMAL LOW (ref >60)
Glucose, Bld: 214 mg/dL — ABNORMAL HIGH (ref 70–99)
Potassium: 5.7 mmol/L — ABNORMAL HIGH (ref 3.5–5.1)
Sodium: 140 mmol/L (ref 135–145)
Total Bilirubin: 0.3 mg/dL (ref 0.3–1.2)
Total Protein: 6.7 g/dL (ref 6.5–8.1)

## 2019-10-18 LAB — CBC WITH DIFFERENTIAL (CANCER CENTER ONLY)
Abs Immature Granulocytes: 0.01 10*3/uL (ref 0.00–0.07)
Basophils Absolute: 0.1 10*3/uL (ref 0.0–0.1)
Basophils Relative: 1 %
Eosinophils Absolute: 0.2 10*3/uL (ref 0.0–0.5)
Eosinophils Relative: 4 %
HCT: 37 % (ref 36.0–46.0)
Hemoglobin: 11.3 g/dL — ABNORMAL LOW (ref 12.0–15.0)
Immature Granulocytes: 0 %
Lymphocytes Relative: 30 %
Lymphs Abs: 1.8 10*3/uL (ref 0.7–4.0)
MCH: 29.9 pg (ref 26.0–34.0)
MCHC: 30.5 g/dL (ref 30.0–36.0)
MCV: 97.9 fL (ref 80.0–100.0)
Monocytes Absolute: 0.5 10*3/uL (ref 0.1–1.0)
Monocytes Relative: 8 %
Neutro Abs: 3.3 10*3/uL (ref 1.7–7.7)
Neutrophils Relative %: 57 %
Platelet Count: 127 10*3/uL — ABNORMAL LOW (ref 150–400)
RBC: 3.78 MIL/uL — ABNORMAL LOW (ref 3.87–5.11)
RDW: 16.2 % — ABNORMAL HIGH (ref 11.5–15.5)
WBC Count: 5.8 10*3/uL (ref 4.0–10.5)
nRBC: 0 % (ref 0.0–0.2)

## 2019-10-18 MED ORDER — CYANOCOBALAMIN 1000 MCG/ML IJ SOLN
INTRAMUSCULAR | Status: AC
  Filled 2019-10-18: qty 1

## 2019-10-18 MED ORDER — CYANOCOBALAMIN 1000 MCG/ML IJ SOLN
1000.0000 ug | Freq: Once | INTRAMUSCULAR | Status: AC
  Administered 2019-10-18: 1000 ug via INTRAMUSCULAR

## 2019-10-18 NOTE — Progress Notes (Deleted)
HGB 11.3 no injection needed today

## 2019-10-18 NOTE — Assessment & Plan Note (Signed)
04/25/2018: Hemoglobin 9.8, MCV 89, platelets 129 08/09/2018: Hemoglobin 9.8, MCV 90.4, platelets 142 11/16/2018:Hemoglobin 7, MCV 94.7, platelets 188 01/02/2019: Hemoglobin 8.4, MCV 91.8, platelets 93 01/30/2019: Hemoglobin 11.8, platelets 125 02/13/2019: Hemoglobin 11.6, platelets 113  Bone marrow biopsy: 01/20/2019: Hypercellular bone marrow 50 to 60% for age with trilineage hematopoiesis, several small lymphoid aggregates present. Mild dyspoietic changes involving the red cells. No increase in blasts. Confirmed that these changes are related to chronic kidney disease. Flow cytometry: Negative, platelet clumping noted suggestive of pseudothrombocytopenia.  Current treatment: 1.B12 injections monthly,began in 05/2017 2.Retacrit injectionsevery 4 weeks if hemoglobin drops below 10, began in 10/2017  CURRENT THERAPY:  Retacrit, B12 

## 2019-10-19 ENCOUNTER — Telehealth: Payer: Self-pay | Admitting: Hematology and Oncology

## 2019-10-19 DIAGNOSIS — T827XXA Infection and inflammatory reaction due to other cardiac and vascular devices, implants and grafts, initial encounter: Secondary | ICD-10-CM | POA: Diagnosis not present

## 2019-10-19 DIAGNOSIS — A4101 Sepsis due to Methicillin susceptible Staphylococcus aureus: Secondary | ICD-10-CM | POA: Diagnosis not present

## 2019-10-19 DIAGNOSIS — E11319 Type 2 diabetes mellitus with unspecified diabetic retinopathy without macular edema: Secondary | ICD-10-CM | POA: Diagnosis not present

## 2019-10-19 DIAGNOSIS — Z992 Dependence on renal dialysis: Secondary | ICD-10-CM | POA: Diagnosis not present

## 2019-10-19 DIAGNOSIS — I120 Hypertensive chronic kidney disease with stage 5 chronic kidney disease or end stage renal disease: Secondary | ICD-10-CM | POA: Diagnosis not present

## 2019-10-19 DIAGNOSIS — E1122 Type 2 diabetes mellitus with diabetic chronic kidney disease: Secondary | ICD-10-CM | POA: Diagnosis not present

## 2019-10-19 DIAGNOSIS — D631 Anemia in chronic kidney disease: Secondary | ICD-10-CM | POA: Diagnosis not present

## 2019-10-19 DIAGNOSIS — N2581 Secondary hyperparathyroidism of renal origin: Secondary | ICD-10-CM | POA: Diagnosis not present

## 2019-10-19 DIAGNOSIS — E538 Deficiency of other specified B group vitamins: Secondary | ICD-10-CM | POA: Diagnosis not present

## 2019-10-19 DIAGNOSIS — E440 Moderate protein-calorie malnutrition: Secondary | ICD-10-CM | POA: Diagnosis not present

## 2019-10-19 DIAGNOSIS — N186 End stage renal disease: Secondary | ICD-10-CM | POA: Diagnosis not present

## 2019-10-19 NOTE — Telephone Encounter (Signed)
Scheduled appts per 8/25 los. Pt's spouse confirmed appt date and time.

## 2019-10-21 DIAGNOSIS — N2581 Secondary hyperparathyroidism of renal origin: Secondary | ICD-10-CM | POA: Diagnosis not present

## 2019-10-21 DIAGNOSIS — Z992 Dependence on renal dialysis: Secondary | ICD-10-CM | POA: Diagnosis not present

## 2019-10-21 DIAGNOSIS — N186 End stage renal disease: Secondary | ICD-10-CM | POA: Diagnosis not present

## 2019-10-24 DIAGNOSIS — N2581 Secondary hyperparathyroidism of renal origin: Secondary | ICD-10-CM | POA: Diagnosis not present

## 2019-10-24 DIAGNOSIS — E1129 Type 2 diabetes mellitus with other diabetic kidney complication: Secondary | ICD-10-CM | POA: Diagnosis not present

## 2019-10-24 DIAGNOSIS — N186 End stage renal disease: Secondary | ICD-10-CM | POA: Diagnosis not present

## 2019-10-24 DIAGNOSIS — Z992 Dependence on renal dialysis: Secondary | ICD-10-CM | POA: Diagnosis not present

## 2019-10-26 ENCOUNTER — Other Ambulatory Visit: Payer: Self-pay

## 2019-10-26 DIAGNOSIS — E11319 Type 2 diabetes mellitus with unspecified diabetic retinopathy without macular edema: Secondary | ICD-10-CM | POA: Diagnosis not present

## 2019-10-26 DIAGNOSIS — N186 End stage renal disease: Secondary | ICD-10-CM | POA: Diagnosis not present

## 2019-10-26 DIAGNOSIS — Z992 Dependence on renal dialysis: Secondary | ICD-10-CM | POA: Diagnosis not present

## 2019-10-26 DIAGNOSIS — N2581 Secondary hyperparathyroidism of renal origin: Secondary | ICD-10-CM | POA: Diagnosis not present

## 2019-10-26 DIAGNOSIS — E440 Moderate protein-calorie malnutrition: Secondary | ICD-10-CM | POA: Diagnosis not present

## 2019-10-26 DIAGNOSIS — E1122 Type 2 diabetes mellitus with diabetic chronic kidney disease: Secondary | ICD-10-CM | POA: Diagnosis not present

## 2019-10-26 DIAGNOSIS — A4101 Sepsis due to Methicillin susceptible Staphylococcus aureus: Secondary | ICD-10-CM | POA: Diagnosis not present

## 2019-10-26 DIAGNOSIS — D631 Anemia in chronic kidney disease: Secondary | ICD-10-CM | POA: Diagnosis not present

## 2019-10-26 DIAGNOSIS — E538 Deficiency of other specified B group vitamins: Secondary | ICD-10-CM | POA: Diagnosis not present

## 2019-10-26 DIAGNOSIS — I120 Hypertensive chronic kidney disease with stage 5 chronic kidney disease or end stage renal disease: Secondary | ICD-10-CM | POA: Diagnosis not present

## 2019-10-26 DIAGNOSIS — T827XXA Infection and inflammatory reaction due to other cardiac and vascular devices, implants and grafts, initial encounter: Secondary | ICD-10-CM | POA: Diagnosis not present

## 2019-10-26 NOTE — Progress Notes (Signed)
Bailey [name] called triage with questions/concerns about her access vs catheter at HD. I returned her call and tried to call pt back-left message to call us back if pt still needs assistance.

## 2019-10-28 DIAGNOSIS — N186 End stage renal disease: Secondary | ICD-10-CM | POA: Diagnosis not present

## 2019-10-28 DIAGNOSIS — N2581 Secondary hyperparathyroidism of renal origin: Secondary | ICD-10-CM | POA: Diagnosis not present

## 2019-10-28 DIAGNOSIS — Z992 Dependence on renal dialysis: Secondary | ICD-10-CM | POA: Diagnosis not present

## 2019-10-31 DIAGNOSIS — N186 End stage renal disease: Secondary | ICD-10-CM | POA: Diagnosis not present

## 2019-10-31 DIAGNOSIS — N2581 Secondary hyperparathyroidism of renal origin: Secondary | ICD-10-CM | POA: Diagnosis not present

## 2019-10-31 DIAGNOSIS — Z992 Dependence on renal dialysis: Secondary | ICD-10-CM | POA: Diagnosis not present

## 2019-11-02 DIAGNOSIS — Z992 Dependence on renal dialysis: Secondary | ICD-10-CM | POA: Diagnosis not present

## 2019-11-02 DIAGNOSIS — N2581 Secondary hyperparathyroidism of renal origin: Secondary | ICD-10-CM | POA: Diagnosis not present

## 2019-11-02 DIAGNOSIS — N186 End stage renal disease: Secondary | ICD-10-CM | POA: Diagnosis not present

## 2019-11-04 DIAGNOSIS — N186 End stage renal disease: Secondary | ICD-10-CM | POA: Diagnosis not present

## 2019-11-04 DIAGNOSIS — Z992 Dependence on renal dialysis: Secondary | ICD-10-CM | POA: Diagnosis not present

## 2019-11-04 DIAGNOSIS — N2581 Secondary hyperparathyroidism of renal origin: Secondary | ICD-10-CM | POA: Diagnosis not present

## 2019-11-06 DIAGNOSIS — I120 Hypertensive chronic kidney disease with stage 5 chronic kidney disease or end stage renal disease: Secondary | ICD-10-CM | POA: Diagnosis not present

## 2019-11-06 DIAGNOSIS — D631 Anemia in chronic kidney disease: Secondary | ICD-10-CM | POA: Diagnosis not present

## 2019-11-06 DIAGNOSIS — A4101 Sepsis due to Methicillin susceptible Staphylococcus aureus: Secondary | ICD-10-CM | POA: Diagnosis not present

## 2019-11-06 DIAGNOSIS — E11319 Type 2 diabetes mellitus with unspecified diabetic retinopathy without macular edema: Secondary | ICD-10-CM | POA: Diagnosis not present

## 2019-11-06 DIAGNOSIS — E538 Deficiency of other specified B group vitamins: Secondary | ICD-10-CM | POA: Diagnosis not present

## 2019-11-06 DIAGNOSIS — N186 End stage renal disease: Secondary | ICD-10-CM | POA: Diagnosis not present

## 2019-11-06 DIAGNOSIS — E440 Moderate protein-calorie malnutrition: Secondary | ICD-10-CM | POA: Diagnosis not present

## 2019-11-06 DIAGNOSIS — T827XXA Infection and inflammatory reaction due to other cardiac and vascular devices, implants and grafts, initial encounter: Secondary | ICD-10-CM | POA: Diagnosis not present

## 2019-11-06 DIAGNOSIS — E1122 Type 2 diabetes mellitus with diabetic chronic kidney disease: Secondary | ICD-10-CM | POA: Diagnosis not present

## 2019-11-07 DIAGNOSIS — Z992 Dependence on renal dialysis: Secondary | ICD-10-CM | POA: Diagnosis not present

## 2019-11-07 DIAGNOSIS — N2581 Secondary hyperparathyroidism of renal origin: Secondary | ICD-10-CM | POA: Diagnosis not present

## 2019-11-07 DIAGNOSIS — N186 End stage renal disease: Secondary | ICD-10-CM | POA: Diagnosis not present

## 2019-11-09 DIAGNOSIS — Z23 Encounter for immunization: Secondary | ICD-10-CM | POA: Diagnosis not present

## 2019-11-09 DIAGNOSIS — N186 End stage renal disease: Secondary | ICD-10-CM | POA: Diagnosis not present

## 2019-11-09 DIAGNOSIS — Z992 Dependence on renal dialysis: Secondary | ICD-10-CM | POA: Diagnosis not present

## 2019-11-09 DIAGNOSIS — N2581 Secondary hyperparathyroidism of renal origin: Secondary | ICD-10-CM | POA: Diagnosis not present

## 2019-11-11 DIAGNOSIS — N2581 Secondary hyperparathyroidism of renal origin: Secondary | ICD-10-CM | POA: Diagnosis not present

## 2019-11-11 DIAGNOSIS — Z992 Dependence on renal dialysis: Secondary | ICD-10-CM | POA: Diagnosis not present

## 2019-11-11 DIAGNOSIS — N186 End stage renal disease: Secondary | ICD-10-CM | POA: Diagnosis not present

## 2019-11-13 DIAGNOSIS — E1165 Type 2 diabetes mellitus with hyperglycemia: Secondary | ICD-10-CM | POA: Diagnosis not present

## 2019-11-13 DIAGNOSIS — Z23 Encounter for immunization: Secondary | ICD-10-CM | POA: Diagnosis not present

## 2019-11-13 DIAGNOSIS — F411 Generalized anxiety disorder: Secondary | ICD-10-CM | POA: Diagnosis not present

## 2019-11-13 DIAGNOSIS — E782 Mixed hyperlipidemia: Secondary | ICD-10-CM | POA: Diagnosis not present

## 2019-11-13 DIAGNOSIS — I10 Essential (primary) hypertension: Secondary | ICD-10-CM | POA: Diagnosis not present

## 2019-11-13 DIAGNOSIS — N184 Chronic kidney disease, stage 4 (severe): Secondary | ICD-10-CM | POA: Diagnosis not present

## 2019-11-14 DIAGNOSIS — N186 End stage renal disease: Secondary | ICD-10-CM | POA: Diagnosis not present

## 2019-11-14 DIAGNOSIS — Z992 Dependence on renal dialysis: Secondary | ICD-10-CM | POA: Diagnosis not present

## 2019-11-14 DIAGNOSIS — N2581 Secondary hyperparathyroidism of renal origin: Secondary | ICD-10-CM | POA: Diagnosis not present

## 2019-11-16 DIAGNOSIS — Z992 Dependence on renal dialysis: Secondary | ICD-10-CM | POA: Diagnosis not present

## 2019-11-16 DIAGNOSIS — N2581 Secondary hyperparathyroidism of renal origin: Secondary | ICD-10-CM | POA: Diagnosis not present

## 2019-11-16 DIAGNOSIS — N186 End stage renal disease: Secondary | ICD-10-CM | POA: Diagnosis not present

## 2019-11-18 DIAGNOSIS — N2581 Secondary hyperparathyroidism of renal origin: Secondary | ICD-10-CM | POA: Diagnosis not present

## 2019-11-18 DIAGNOSIS — Z992 Dependence on renal dialysis: Secondary | ICD-10-CM | POA: Diagnosis not present

## 2019-11-18 DIAGNOSIS — N186 End stage renal disease: Secondary | ICD-10-CM | POA: Diagnosis not present

## 2019-11-21 DIAGNOSIS — N186 End stage renal disease: Secondary | ICD-10-CM | POA: Diagnosis not present

## 2019-11-21 DIAGNOSIS — N2581 Secondary hyperparathyroidism of renal origin: Secondary | ICD-10-CM | POA: Diagnosis not present

## 2019-11-21 DIAGNOSIS — Z992 Dependence on renal dialysis: Secondary | ICD-10-CM | POA: Diagnosis not present

## 2019-11-23 DIAGNOSIS — N2581 Secondary hyperparathyroidism of renal origin: Secondary | ICD-10-CM | POA: Diagnosis not present

## 2019-11-23 DIAGNOSIS — E1129 Type 2 diabetes mellitus with other diabetic kidney complication: Secondary | ICD-10-CM | POA: Diagnosis not present

## 2019-11-23 DIAGNOSIS — Z992 Dependence on renal dialysis: Secondary | ICD-10-CM | POA: Diagnosis not present

## 2019-11-23 DIAGNOSIS — N186 End stage renal disease: Secondary | ICD-10-CM | POA: Diagnosis not present

## 2019-11-25 DIAGNOSIS — N186 End stage renal disease: Secondary | ICD-10-CM | POA: Diagnosis not present

## 2019-11-25 DIAGNOSIS — N2581 Secondary hyperparathyroidism of renal origin: Secondary | ICD-10-CM | POA: Diagnosis not present

## 2019-11-25 DIAGNOSIS — Z992 Dependence on renal dialysis: Secondary | ICD-10-CM | POA: Diagnosis not present

## 2019-11-28 DIAGNOSIS — N2581 Secondary hyperparathyroidism of renal origin: Secondary | ICD-10-CM | POA: Diagnosis not present

## 2019-11-28 DIAGNOSIS — Z992 Dependence on renal dialysis: Secondary | ICD-10-CM | POA: Diagnosis not present

## 2019-11-28 DIAGNOSIS — N186 End stage renal disease: Secondary | ICD-10-CM | POA: Diagnosis not present

## 2019-11-30 DIAGNOSIS — N186 End stage renal disease: Secondary | ICD-10-CM | POA: Diagnosis not present

## 2019-11-30 DIAGNOSIS — Z992 Dependence on renal dialysis: Secondary | ICD-10-CM | POA: Diagnosis not present

## 2019-11-30 DIAGNOSIS — N2581 Secondary hyperparathyroidism of renal origin: Secondary | ICD-10-CM | POA: Diagnosis not present

## 2019-12-02 DIAGNOSIS — N2581 Secondary hyperparathyroidism of renal origin: Secondary | ICD-10-CM | POA: Diagnosis not present

## 2019-12-02 DIAGNOSIS — Z992 Dependence on renal dialysis: Secondary | ICD-10-CM | POA: Diagnosis not present

## 2019-12-02 DIAGNOSIS — N186 End stage renal disease: Secondary | ICD-10-CM | POA: Diagnosis not present

## 2019-12-05 DIAGNOSIS — N2581 Secondary hyperparathyroidism of renal origin: Secondary | ICD-10-CM | POA: Diagnosis not present

## 2019-12-05 DIAGNOSIS — Z992 Dependence on renal dialysis: Secondary | ICD-10-CM | POA: Diagnosis not present

## 2019-12-05 DIAGNOSIS — N186 End stage renal disease: Secondary | ICD-10-CM | POA: Diagnosis not present

## 2019-12-07 DIAGNOSIS — N2581 Secondary hyperparathyroidism of renal origin: Secondary | ICD-10-CM | POA: Diagnosis not present

## 2019-12-07 DIAGNOSIS — Z992 Dependence on renal dialysis: Secondary | ICD-10-CM | POA: Diagnosis not present

## 2019-12-07 DIAGNOSIS — N186 End stage renal disease: Secondary | ICD-10-CM | POA: Diagnosis not present

## 2019-12-09 DIAGNOSIS — N2581 Secondary hyperparathyroidism of renal origin: Secondary | ICD-10-CM | POA: Diagnosis not present

## 2019-12-09 DIAGNOSIS — Z992 Dependence on renal dialysis: Secondary | ICD-10-CM | POA: Diagnosis not present

## 2019-12-09 DIAGNOSIS — N186 End stage renal disease: Secondary | ICD-10-CM | POA: Diagnosis not present

## 2019-12-12 DIAGNOSIS — Z992 Dependence on renal dialysis: Secondary | ICD-10-CM | POA: Diagnosis not present

## 2019-12-12 DIAGNOSIS — N2581 Secondary hyperparathyroidism of renal origin: Secondary | ICD-10-CM | POA: Diagnosis not present

## 2019-12-12 DIAGNOSIS — N186 End stage renal disease: Secondary | ICD-10-CM | POA: Diagnosis not present

## 2019-12-14 DIAGNOSIS — N2581 Secondary hyperparathyroidism of renal origin: Secondary | ICD-10-CM | POA: Diagnosis not present

## 2019-12-14 DIAGNOSIS — Z992 Dependence on renal dialysis: Secondary | ICD-10-CM | POA: Diagnosis not present

## 2019-12-14 DIAGNOSIS — N186 End stage renal disease: Secondary | ICD-10-CM | POA: Diagnosis not present

## 2019-12-16 DIAGNOSIS — Z992 Dependence on renal dialysis: Secondary | ICD-10-CM | POA: Diagnosis not present

## 2019-12-16 DIAGNOSIS — N2581 Secondary hyperparathyroidism of renal origin: Secondary | ICD-10-CM | POA: Diagnosis not present

## 2019-12-16 DIAGNOSIS — N186 End stage renal disease: Secondary | ICD-10-CM | POA: Diagnosis not present

## 2019-12-19 ENCOUNTER — Other Ambulatory Visit: Payer: Self-pay | Admitting: *Deleted

## 2019-12-19 DIAGNOSIS — N186 End stage renal disease: Secondary | ICD-10-CM | POA: Diagnosis not present

## 2019-12-19 DIAGNOSIS — Z862 Personal history of diseases of the blood and blood-forming organs and certain disorders involving the immune mechanism: Secondary | ICD-10-CM

## 2019-12-19 DIAGNOSIS — N2581 Secondary hyperparathyroidism of renal origin: Secondary | ICD-10-CM | POA: Diagnosis not present

## 2019-12-19 DIAGNOSIS — Z992 Dependence on renal dialysis: Secondary | ICD-10-CM | POA: Diagnosis not present

## 2019-12-20 ENCOUNTER — Inpatient Hospital Stay: Payer: Medicare HMO

## 2019-12-20 ENCOUNTER — Other Ambulatory Visit: Payer: Self-pay

## 2019-12-20 ENCOUNTER — Inpatient Hospital Stay: Payer: Medicare HMO | Attending: Hematology and Oncology

## 2019-12-20 ENCOUNTER — Encounter: Payer: Self-pay | Admitting: *Deleted

## 2019-12-20 VITALS — BP 186/62 | HR 66 | Temp 97.9°F | Resp 16

## 2019-12-20 DIAGNOSIS — E538 Deficiency of other specified B group vitamins: Secondary | ICD-10-CM | POA: Insufficient documentation

## 2019-12-20 DIAGNOSIS — Z862 Personal history of diseases of the blood and blood-forming organs and certain disorders involving the immune mechanism: Secondary | ICD-10-CM

## 2019-12-20 DIAGNOSIS — Z992 Dependence on renal dialysis: Secondary | ICD-10-CM | POA: Insufficient documentation

## 2019-12-20 DIAGNOSIS — N189 Chronic kidney disease, unspecified: Secondary | ICD-10-CM | POA: Insufficient documentation

## 2019-12-20 DIAGNOSIS — I129 Hypertensive chronic kidney disease with stage 1 through stage 4 chronic kidney disease, or unspecified chronic kidney disease: Secondary | ICD-10-CM | POA: Diagnosis not present

## 2019-12-20 DIAGNOSIS — D631 Anemia in chronic kidney disease: Secondary | ICD-10-CM | POA: Diagnosis not present

## 2019-12-20 LAB — CBC WITH DIFFERENTIAL (CANCER CENTER ONLY)
Abs Immature Granulocytes: 0.01 10*3/uL (ref 0.00–0.07)
Basophils Absolute: 0.1 10*3/uL (ref 0.0–0.1)
Basophils Relative: 1 %
Eosinophils Absolute: 0.2 10*3/uL (ref 0.0–0.5)
Eosinophils Relative: 4 %
HCT: 33 % — ABNORMAL LOW (ref 36.0–46.0)
Hemoglobin: 10.2 g/dL — ABNORMAL LOW (ref 12.0–15.0)
Immature Granulocytes: 0 %
Lymphocytes Relative: 42 %
Lymphs Abs: 2.1 10*3/uL (ref 0.7–4.0)
MCH: 29.3 pg (ref 26.0–34.0)
MCHC: 30.9 g/dL (ref 30.0–36.0)
MCV: 94.8 fL (ref 80.0–100.0)
Monocytes Absolute: 0.6 10*3/uL (ref 0.1–1.0)
Monocytes Relative: 11 %
Neutro Abs: 2.2 10*3/uL (ref 1.7–7.7)
Neutrophils Relative %: 42 %
Platelet Count: 115 10*3/uL — ABNORMAL LOW (ref 150–400)
RBC: 3.48 MIL/uL — ABNORMAL LOW (ref 3.87–5.11)
RDW: 18.9 % — ABNORMAL HIGH (ref 11.5–15.5)
WBC Count: 5.1 10*3/uL (ref 4.0–10.5)
nRBC: 0.4 % — ABNORMAL HIGH (ref 0.0–0.2)

## 2019-12-20 LAB — CMP (CANCER CENTER ONLY)
ALT: 8 U/L (ref 0–44)
AST: 13 U/L — ABNORMAL LOW (ref 15–41)
Albumin: 3.5 g/dL (ref 3.5–5.0)
Alkaline Phosphatase: 91 U/L (ref 38–126)
Anion gap: 9 (ref 5–15)
BUN: 37 mg/dL — ABNORMAL HIGH (ref 8–23)
CO2: 38 mmol/L — ABNORMAL HIGH (ref 22–32)
Calcium: 9.9 mg/dL (ref 8.9–10.3)
Chloride: 93 mmol/L — ABNORMAL LOW (ref 98–111)
Creatinine: 3.89 mg/dL (ref 0.44–1.00)
GFR, Estimated: 12 mL/min — ABNORMAL LOW (ref >60)
Glucose, Bld: 165 mg/dL — ABNORMAL HIGH (ref 70–99)
Potassium: 5.3 mmol/L — ABNORMAL HIGH (ref 3.5–5.1)
Sodium: 140 mmol/L (ref 135–145)
Total Bilirubin: 0.4 mg/dL (ref 0.3–1.2)
Total Protein: 7 g/dL (ref 6.5–8.1)

## 2019-12-20 MED ORDER — CYANOCOBALAMIN 1000 MCG/ML IJ SOLN
1000.0000 ug | Freq: Once | INTRAMUSCULAR | Status: AC
  Administered 2019-12-20: 1000 ug via INTRAMUSCULAR

## 2019-12-20 MED ORDER — EPOETIN ALFA-EPBX 40000 UNIT/ML IJ SOLN: 40000.0000 [IU] | Freq: Once | INTRAMUSCULAR | Status: DC

## 2019-12-20 MED ORDER — EPOETIN ALFA-EPBX 40000 UNIT/ML IJ SOLN
INTRAMUSCULAR | Status: AC
  Filled 2019-12-20: qty 1

## 2019-12-20 MED ORDER — CYANOCOBALAMIN 1000 MCG/ML IJ SOLN
INTRAMUSCULAR | Status: AC
  Filled 2019-12-20: qty 1

## 2019-12-20 NOTE — Patient Instructions (Signed)

## 2019-12-20 NOTE — Progress Notes (Signed)
Per MD okay to treat with retacrit with Hgb 10.2 and B/P 186/62

## 2019-12-20 NOTE — Progress Notes (Signed)
CRITICAL VALUE ALERT  Critical Value:  Crt 3.89  Date & Time Notied:  12/20/19 at 4:15 pm  Provider Notified: Nicholas Lose, MD  Orders Received/Actions taken: MD notified and verbalized understanding. No orders received at this time.

## 2019-12-21 DIAGNOSIS — N186 End stage renal disease: Secondary | ICD-10-CM | POA: Diagnosis not present

## 2019-12-21 DIAGNOSIS — N2581 Secondary hyperparathyroidism of renal origin: Secondary | ICD-10-CM | POA: Diagnosis not present

## 2019-12-21 DIAGNOSIS — Z992 Dependence on renal dialysis: Secondary | ICD-10-CM | POA: Diagnosis not present

## 2019-12-22 DIAGNOSIS — Z452 Encounter for adjustment and management of vascular access device: Secondary | ICD-10-CM | POA: Diagnosis not present

## 2019-12-23 DIAGNOSIS — N2581 Secondary hyperparathyroidism of renal origin: Secondary | ICD-10-CM | POA: Diagnosis not present

## 2019-12-23 DIAGNOSIS — Z992 Dependence on renal dialysis: Secondary | ICD-10-CM | POA: Diagnosis not present

## 2019-12-23 DIAGNOSIS — N186 End stage renal disease: Secondary | ICD-10-CM | POA: Diagnosis not present

## 2019-12-24 DIAGNOSIS — Z992 Dependence on renal dialysis: Secondary | ICD-10-CM | POA: Diagnosis not present

## 2019-12-24 DIAGNOSIS — N186 End stage renal disease: Secondary | ICD-10-CM | POA: Diagnosis not present

## 2019-12-24 DIAGNOSIS — E1129 Type 2 diabetes mellitus with other diabetic kidney complication: Secondary | ICD-10-CM | POA: Diagnosis not present

## 2019-12-25 DIAGNOSIS — H524 Presbyopia: Secondary | ICD-10-CM | POA: Diagnosis not present

## 2019-12-26 DIAGNOSIS — N2581 Secondary hyperparathyroidism of renal origin: Secondary | ICD-10-CM | POA: Diagnosis not present

## 2019-12-26 DIAGNOSIS — Z992 Dependence on renal dialysis: Secondary | ICD-10-CM | POA: Diagnosis not present

## 2019-12-26 DIAGNOSIS — N186 End stage renal disease: Secondary | ICD-10-CM | POA: Diagnosis not present

## 2019-12-28 DIAGNOSIS — N186 End stage renal disease: Secondary | ICD-10-CM | POA: Diagnosis not present

## 2019-12-28 DIAGNOSIS — Z992 Dependence on renal dialysis: Secondary | ICD-10-CM | POA: Diagnosis not present

## 2019-12-28 DIAGNOSIS — N2581 Secondary hyperparathyroidism of renal origin: Secondary | ICD-10-CM | POA: Diagnosis not present

## 2019-12-30 DIAGNOSIS — N186 End stage renal disease: Secondary | ICD-10-CM | POA: Diagnosis not present

## 2019-12-30 DIAGNOSIS — N2581 Secondary hyperparathyroidism of renal origin: Secondary | ICD-10-CM | POA: Diagnosis not present

## 2019-12-30 DIAGNOSIS — Z992 Dependence on renal dialysis: Secondary | ICD-10-CM | POA: Diagnosis not present

## 2020-01-02 DIAGNOSIS — N2581 Secondary hyperparathyroidism of renal origin: Secondary | ICD-10-CM | POA: Diagnosis not present

## 2020-01-02 DIAGNOSIS — N186 End stage renal disease: Secondary | ICD-10-CM | POA: Diagnosis not present

## 2020-01-02 DIAGNOSIS — Z992 Dependence on renal dialysis: Secondary | ICD-10-CM | POA: Diagnosis not present

## 2020-01-04 DIAGNOSIS — N2581 Secondary hyperparathyroidism of renal origin: Secondary | ICD-10-CM | POA: Diagnosis not present

## 2020-01-04 DIAGNOSIS — N186 End stage renal disease: Secondary | ICD-10-CM | POA: Diagnosis not present

## 2020-01-04 DIAGNOSIS — Z992 Dependence on renal dialysis: Secondary | ICD-10-CM | POA: Diagnosis not present

## 2020-01-06 DIAGNOSIS — N2581 Secondary hyperparathyroidism of renal origin: Secondary | ICD-10-CM | POA: Diagnosis not present

## 2020-01-06 DIAGNOSIS — Z992 Dependence on renal dialysis: Secondary | ICD-10-CM | POA: Diagnosis not present

## 2020-01-06 DIAGNOSIS — N186 End stage renal disease: Secondary | ICD-10-CM | POA: Diagnosis not present

## 2020-01-09 DIAGNOSIS — N2581 Secondary hyperparathyroidism of renal origin: Secondary | ICD-10-CM | POA: Diagnosis not present

## 2020-01-09 DIAGNOSIS — N186 End stage renal disease: Secondary | ICD-10-CM | POA: Diagnosis not present

## 2020-01-09 DIAGNOSIS — Z992 Dependence on renal dialysis: Secondary | ICD-10-CM | POA: Diagnosis not present

## 2020-01-11 DIAGNOSIS — N2581 Secondary hyperparathyroidism of renal origin: Secondary | ICD-10-CM | POA: Diagnosis not present

## 2020-01-11 DIAGNOSIS — Z992 Dependence on renal dialysis: Secondary | ICD-10-CM | POA: Diagnosis not present

## 2020-01-11 DIAGNOSIS — N186 End stage renal disease: Secondary | ICD-10-CM | POA: Diagnosis not present

## 2020-01-13 DIAGNOSIS — N2581 Secondary hyperparathyroidism of renal origin: Secondary | ICD-10-CM | POA: Diagnosis not present

## 2020-01-13 DIAGNOSIS — Z992 Dependence on renal dialysis: Secondary | ICD-10-CM | POA: Diagnosis not present

## 2020-01-13 DIAGNOSIS — N186 End stage renal disease: Secondary | ICD-10-CM | POA: Diagnosis not present

## 2020-01-15 DIAGNOSIS — N186 End stage renal disease: Secondary | ICD-10-CM | POA: Diagnosis not present

## 2020-01-15 DIAGNOSIS — N2581 Secondary hyperparathyroidism of renal origin: Secondary | ICD-10-CM | POA: Diagnosis not present

## 2020-01-15 DIAGNOSIS — Z992 Dependence on renal dialysis: Secondary | ICD-10-CM | POA: Diagnosis not present

## 2020-01-17 DIAGNOSIS — N186 End stage renal disease: Secondary | ICD-10-CM | POA: Diagnosis not present

## 2020-01-17 DIAGNOSIS — Z992 Dependence on renal dialysis: Secondary | ICD-10-CM | POA: Diagnosis not present

## 2020-01-17 DIAGNOSIS — N2581 Secondary hyperparathyroidism of renal origin: Secondary | ICD-10-CM | POA: Diagnosis not present

## 2020-01-22 DIAGNOSIS — N2581 Secondary hyperparathyroidism of renal origin: Secondary | ICD-10-CM | POA: Diagnosis not present

## 2020-01-22 DIAGNOSIS — Z992 Dependence on renal dialysis: Secondary | ICD-10-CM | POA: Diagnosis not present

## 2020-01-22 DIAGNOSIS — N186 End stage renal disease: Secondary | ICD-10-CM | POA: Diagnosis not present

## 2020-01-23 DIAGNOSIS — E1129 Type 2 diabetes mellitus with other diabetic kidney complication: Secondary | ICD-10-CM | POA: Diagnosis not present

## 2020-01-23 DIAGNOSIS — Z992 Dependence on renal dialysis: Secondary | ICD-10-CM | POA: Diagnosis not present

## 2020-01-23 DIAGNOSIS — N2581 Secondary hyperparathyroidism of renal origin: Secondary | ICD-10-CM | POA: Diagnosis not present

## 2020-01-23 DIAGNOSIS — N186 End stage renal disease: Secondary | ICD-10-CM | POA: Diagnosis not present

## 2020-01-25 DIAGNOSIS — N186 End stage renal disease: Secondary | ICD-10-CM | POA: Diagnosis not present

## 2020-01-25 DIAGNOSIS — N2581 Secondary hyperparathyroidism of renal origin: Secondary | ICD-10-CM | POA: Diagnosis not present

## 2020-01-25 DIAGNOSIS — Z992 Dependence on renal dialysis: Secondary | ICD-10-CM | POA: Diagnosis not present

## 2020-01-27 DIAGNOSIS — Z992 Dependence on renal dialysis: Secondary | ICD-10-CM | POA: Diagnosis not present

## 2020-01-27 DIAGNOSIS — N186 End stage renal disease: Secondary | ICD-10-CM | POA: Diagnosis not present

## 2020-01-27 DIAGNOSIS — N2581 Secondary hyperparathyroidism of renal origin: Secondary | ICD-10-CM | POA: Diagnosis not present

## 2020-01-29 DIAGNOSIS — H25811 Combined forms of age-related cataract, right eye: Secondary | ICD-10-CM | POA: Diagnosis not present

## 2020-01-29 DIAGNOSIS — Z01818 Encounter for other preprocedural examination: Secondary | ICD-10-CM | POA: Diagnosis not present

## 2020-01-30 DIAGNOSIS — N186 End stage renal disease: Secondary | ICD-10-CM | POA: Diagnosis not present

## 2020-01-30 DIAGNOSIS — N2581 Secondary hyperparathyroidism of renal origin: Secondary | ICD-10-CM | POA: Diagnosis not present

## 2020-01-30 DIAGNOSIS — Z992 Dependence on renal dialysis: Secondary | ICD-10-CM | POA: Diagnosis not present

## 2020-01-31 DIAGNOSIS — Z1331 Encounter for screening for depression: Secondary | ICD-10-CM | POA: Diagnosis not present

## 2020-01-31 DIAGNOSIS — E11319 Type 2 diabetes mellitus with unspecified diabetic retinopathy without macular edema: Secondary | ICD-10-CM | POA: Diagnosis not present

## 2020-01-31 DIAGNOSIS — N184 Chronic kidney disease, stage 4 (severe): Secondary | ICD-10-CM | POA: Diagnosis not present

## 2020-01-31 DIAGNOSIS — Z0000 Encounter for general adult medical examination without abnormal findings: Secondary | ICD-10-CM | POA: Diagnosis not present

## 2020-01-31 DIAGNOSIS — E113411 Type 2 diabetes mellitus with severe nonproliferative diabetic retinopathy with macular edema, right eye: Secondary | ICD-10-CM | POA: Diagnosis not present

## 2020-01-31 DIAGNOSIS — Z1339 Encounter for screening examination for other mental health and behavioral disorders: Secondary | ICD-10-CM | POA: Diagnosis not present

## 2020-01-31 DIAGNOSIS — E113413 Type 2 diabetes mellitus with severe nonproliferative diabetic retinopathy with macular edema, bilateral: Secondary | ICD-10-CM | POA: Diagnosis not present

## 2020-01-31 DIAGNOSIS — D631 Anemia in chronic kidney disease: Secondary | ICD-10-CM | POA: Diagnosis not present

## 2020-01-31 DIAGNOSIS — E118 Type 2 diabetes mellitus with unspecified complications: Secondary | ICD-10-CM | POA: Diagnosis not present

## 2020-01-31 DIAGNOSIS — E1151 Type 2 diabetes mellitus with diabetic peripheral angiopathy without gangrene: Secondary | ICD-10-CM | POA: Diagnosis not present

## 2020-01-31 DIAGNOSIS — E559 Vitamin D deficiency, unspecified: Secondary | ICD-10-CM | POA: Diagnosis not present

## 2020-01-31 DIAGNOSIS — E113412 Type 2 diabetes mellitus with severe nonproliferative diabetic retinopathy with macular edema, left eye: Secondary | ICD-10-CM | POA: Diagnosis not present

## 2020-02-01 DIAGNOSIS — N186 End stage renal disease: Secondary | ICD-10-CM | POA: Diagnosis not present

## 2020-02-01 DIAGNOSIS — Z992 Dependence on renal dialysis: Secondary | ICD-10-CM | POA: Diagnosis not present

## 2020-02-01 DIAGNOSIS — N2581 Secondary hyperparathyroidism of renal origin: Secondary | ICD-10-CM | POA: Diagnosis not present

## 2020-02-03 DIAGNOSIS — N186 End stage renal disease: Secondary | ICD-10-CM | POA: Diagnosis not present

## 2020-02-03 DIAGNOSIS — N2581 Secondary hyperparathyroidism of renal origin: Secondary | ICD-10-CM | POA: Diagnosis not present

## 2020-02-03 DIAGNOSIS — Z992 Dependence on renal dialysis: Secondary | ICD-10-CM | POA: Diagnosis not present

## 2020-02-06 DIAGNOSIS — N2581 Secondary hyperparathyroidism of renal origin: Secondary | ICD-10-CM | POA: Diagnosis not present

## 2020-02-06 DIAGNOSIS — Z992 Dependence on renal dialysis: Secondary | ICD-10-CM | POA: Diagnosis not present

## 2020-02-06 DIAGNOSIS — N186 End stage renal disease: Secondary | ICD-10-CM | POA: Diagnosis not present

## 2020-02-08 DIAGNOSIS — N2581 Secondary hyperparathyroidism of renal origin: Secondary | ICD-10-CM | POA: Diagnosis not present

## 2020-02-08 DIAGNOSIS — N186 End stage renal disease: Secondary | ICD-10-CM | POA: Diagnosis not present

## 2020-02-08 DIAGNOSIS — Z992 Dependence on renal dialysis: Secondary | ICD-10-CM | POA: Diagnosis not present

## 2020-02-09 DIAGNOSIS — H25811 Combined forms of age-related cataract, right eye: Secondary | ICD-10-CM | POA: Diagnosis not present

## 2020-02-09 DIAGNOSIS — H2511 Age-related nuclear cataract, right eye: Secondary | ICD-10-CM | POA: Diagnosis not present

## 2020-02-10 DIAGNOSIS — N2581 Secondary hyperparathyroidism of renal origin: Secondary | ICD-10-CM | POA: Diagnosis not present

## 2020-02-10 DIAGNOSIS — N186 End stage renal disease: Secondary | ICD-10-CM | POA: Diagnosis not present

## 2020-02-10 DIAGNOSIS — Z992 Dependence on renal dialysis: Secondary | ICD-10-CM | POA: Diagnosis not present

## 2020-02-12 DIAGNOSIS — E11319 Type 2 diabetes mellitus with unspecified diabetic retinopathy without macular edema: Secondary | ICD-10-CM | POA: Diagnosis not present

## 2020-02-12 DIAGNOSIS — E118 Type 2 diabetes mellitus with unspecified complications: Secondary | ICD-10-CM | POA: Diagnosis not present

## 2020-02-12 DIAGNOSIS — I10 Essential (primary) hypertension: Secondary | ICD-10-CM | POA: Diagnosis not present

## 2020-02-12 DIAGNOSIS — E559 Vitamin D deficiency, unspecified: Secondary | ICD-10-CM | POA: Diagnosis not present

## 2020-02-12 DIAGNOSIS — E1165 Type 2 diabetes mellitus with hyperglycemia: Secondary | ICD-10-CM | POA: Diagnosis not present

## 2020-02-12 DIAGNOSIS — E782 Mixed hyperlipidemia: Secondary | ICD-10-CM | POA: Diagnosis not present

## 2020-02-12 DIAGNOSIS — H269 Unspecified cataract: Secondary | ICD-10-CM | POA: Diagnosis not present

## 2020-02-12 DIAGNOSIS — N184 Chronic kidney disease, stage 4 (severe): Secondary | ICD-10-CM | POA: Diagnosis not present

## 2020-02-13 DIAGNOSIS — N2581 Secondary hyperparathyroidism of renal origin: Secondary | ICD-10-CM | POA: Diagnosis not present

## 2020-02-13 DIAGNOSIS — Z992 Dependence on renal dialysis: Secondary | ICD-10-CM | POA: Diagnosis not present

## 2020-02-13 DIAGNOSIS — N186 End stage renal disease: Secondary | ICD-10-CM | POA: Diagnosis not present

## 2020-02-15 DIAGNOSIS — Z992 Dependence on renal dialysis: Secondary | ICD-10-CM | POA: Diagnosis not present

## 2020-02-15 DIAGNOSIS — N186 End stage renal disease: Secondary | ICD-10-CM | POA: Diagnosis not present

## 2020-02-15 DIAGNOSIS — N2581 Secondary hyperparathyroidism of renal origin: Secondary | ICD-10-CM | POA: Diagnosis not present

## 2020-02-18 DIAGNOSIS — N2581 Secondary hyperparathyroidism of renal origin: Secondary | ICD-10-CM | POA: Diagnosis not present

## 2020-02-18 DIAGNOSIS — N186 End stage renal disease: Secondary | ICD-10-CM | POA: Diagnosis not present

## 2020-02-18 DIAGNOSIS — Z992 Dependence on renal dialysis: Secondary | ICD-10-CM | POA: Diagnosis not present

## 2020-02-20 DIAGNOSIS — N186 End stage renal disease: Secondary | ICD-10-CM | POA: Diagnosis not present

## 2020-02-20 DIAGNOSIS — Z992 Dependence on renal dialysis: Secondary | ICD-10-CM | POA: Diagnosis not present

## 2020-02-20 DIAGNOSIS — N2581 Secondary hyperparathyroidism of renal origin: Secondary | ICD-10-CM | POA: Diagnosis not present

## 2020-02-22 DIAGNOSIS — N2581 Secondary hyperparathyroidism of renal origin: Secondary | ICD-10-CM | POA: Diagnosis not present

## 2020-02-22 DIAGNOSIS — N186 End stage renal disease: Secondary | ICD-10-CM | POA: Diagnosis not present

## 2020-02-22 DIAGNOSIS — Z992 Dependence on renal dialysis: Secondary | ICD-10-CM | POA: Diagnosis not present

## 2020-02-23 DIAGNOSIS — N186 End stage renal disease: Secondary | ICD-10-CM | POA: Diagnosis not present

## 2020-02-23 DIAGNOSIS — E1129 Type 2 diabetes mellitus with other diabetic kidney complication: Secondary | ICD-10-CM | POA: Diagnosis not present

## 2020-02-23 DIAGNOSIS — Z992 Dependence on renal dialysis: Secondary | ICD-10-CM | POA: Diagnosis not present

## 2020-03-12 ENCOUNTER — Other Ambulatory Visit: Payer: Self-pay | Admitting: *Deleted

## 2020-03-12 DIAGNOSIS — I739 Peripheral vascular disease, unspecified: Secondary | ICD-10-CM

## 2020-03-15 ENCOUNTER — Ambulatory Visit: Payer: Medicare Other | Admitting: Podiatry

## 2020-03-22 ENCOUNTER — Encounter: Payer: Self-pay | Admitting: Vascular Surgery

## 2020-03-22 ENCOUNTER — Other Ambulatory Visit: Payer: Self-pay

## 2020-03-22 ENCOUNTER — Ambulatory Visit (INDEPENDENT_AMBULATORY_CARE_PROVIDER_SITE_OTHER): Payer: Medicare Other | Admitting: Vascular Surgery

## 2020-03-22 ENCOUNTER — Ambulatory Visit (HOSPITAL_COMMUNITY)
Admission: RE | Admit: 2020-03-22 | Discharge: 2020-03-22 | Disposition: A | Discharge status: Home / Self Care | Payer: Medicare Other | Source: Ambulatory Visit | Attending: Vascular Surgery | Admitting: Vascular Surgery

## 2020-03-22 VITALS — BP 130/76 | HR 67 | Temp 97.8°F | Resp 14 | Ht 63.5 in | Wt 116.0 lb

## 2020-03-22 DIAGNOSIS — L97511 Non-pressure chronic ulcer of other part of right foot limited to breakdown of skin: Secondary | ICD-10-CM

## 2020-03-22 DIAGNOSIS — N186 End stage renal disease: Secondary | ICD-10-CM | POA: Diagnosis not present

## 2020-03-22 DIAGNOSIS — Z992 Dependence on renal dialysis: Secondary | ICD-10-CM | POA: Diagnosis not present

## 2020-03-22 DIAGNOSIS — I739 Peripheral vascular disease, unspecified: Secondary | ICD-10-CM

## 2020-03-22 NOTE — Progress Notes (Signed)
Patient ID: Bailey [name], female   DOB: 1950/11/13, 70 y.o.   MRN: [id number]  Reason for Consult: New Patient (Initial Visit) (Right foot)   Referred by Bailey Parish, MD  Subjective:     HPI:  Bailey [name] is a 70 y.o. female with end-stage renal disease on dialysis via left arm AV fistula.  She has previous bilateral great toe amputations.  Risk factors for vascular disease include diabetes, hypertension, end-stage renal disease.  More recently she has an area of ulceration on her medial plantar aspect of her right foot.  This does not cause her any pain.  She has not noted any drainage.  She denies any fevers or chills has no erythema.  She continues to walk with only minimal balance is her limitation.  She has no complaints related to today's visit.  Past Medical History:  Diagnosis Date  . Anemia    patient preference - stopped iron   . ARF (acute renal failure) ([place]) 04/2016   dehydration  . Chronic kidney disease    stage 5  . Depression   . Diabetes mellitus    Type II - pt preference - stopped lantus  . Diabetic retinopathy ([place])   . Diverticulitis   . History of kidney stones    passed- 6  . Hypertension   . Osteomyelitis ([place])   . Vitamin B 12 deficiency 06/09/2017  . Vitamin D deficiency   . Wears partial dentures    upper   Family History  Problem Relation Age of Onset  . Diabetes Mother   . Asthma Sister    Past Surgical History:  Procedure Laterality Date  . AMPUTATION Right 08/14/2016   Procedure: RIGHT 1ST RAY AMPUTATION MID-SHAFT;  Surgeon: Newt Minion, MD;  Location: [facility];  Service: Orthopedics;  Laterality: Right;  . BASCILIC VEIN TRANSPOSITION Left 06/08/2019   Procedure: LEFT BRACHIOCEPHALIC VEIN CREATION;  Surgeon: Marty Heck, MD;  Location: [facility];  Service: Vascular;  Laterality: Left;  . INCISION AND DRAINAGE ABSCESS Left 02/11/2014   Procedure: INCISION AND DRAINAGE ABSCESS Left Buttock;  Surgeon: Jackolyn Confer, MD;   Location: [facility];  Service: General;  Laterality: Left;  . INSERTION OF DIALYSIS CATHETER Left 09/07/2019   Procedure: INSERTION OF LEFT INTERNAL JUGULAR DIALYSIS CATHETER;  Surgeon: Rosetta Posner, MD;  Location: [facility];  Service: Vascular;  Laterality: Left;  . IR REMOVAL TUN CV CATH W/O FL  09/04/2019  . LAPAROSCOPIC SMALL BOWEL RESECTION N/A 05/09/2016   Procedure: LAPAROSCOPIC SMALL BOWEL RESECTION;  Surgeon: Michael Boston, MD;  Location: [facility];  Service: General;  Laterality: N/A;  . LAPAROSCOPY N/A 05/09/2016   Procedure: LAPAROSCOPY DIAGNOSTIC, LYSIS OF ADHESIONS, SMALL BOWEL RESECTION X 2;  Surgeon: Michael Boston, MD;  Location: [facility];  Service: General;  Laterality: N/A;  . TOE AMPUTATION     left foot great toe    Short Social History:  Social History   Tobacco Use  . Smoking status: Never Smoker  . Smokeless tobacco: Never Used  Substance Use Topics  . Alcohol use: No    Alcohol/week: 0.0 standard drinks    No Known Allergies  Current Outpatient Medications  Medication Sig Dispense Refill  . acetaminophen (TYLENOL) 500 MG tablet Take 1,000 mg by mouth every 6 (six) hours as needed for fever or headache (pain).     Marland Kitchen amlodipine-atorvastatin (CADUET) 10-10 MG tablet Take 1 tablet by mouth daily. 2.5 mg at night per husband    .  carvedilol (COREG) 3.125 MG tablet Take 1 tablet (3.125 mg total) by mouth 2 (two) times daily with a meal. 60 tablet 1  . Cholecalciferol (VITAMIN D-3) 125 MCG (5000 UT) TABS Take 5,000 Units by mouth 2 (two) times daily.    . cyanocobalamin (,VITAMIN B-12,) 1000 MCG/ML injection Inject 1,000 mcg into the muscle every 30 (thirty) days.    Marland Kitchen escitalopram (LEXAPRO) 5 MG tablet Take 5 mg by mouth daily as needed (anxiety).    Marland Kitchen NIFEdipine (ADALAT CC) 30 MG 24 hr tablet Take 30 mg by mouth daily.    . sodium bicarbonate 650 MG tablet Take 650 mg by mouth 2 (two) times daily.    . Vitamin D, Ergocalciferol, (DRISDOL) 1.25 MG (50000 UNIT) CAPS capsule Take  50,000 Units by mouth every $RemoveBef'Sunday.      No current facility-administered medications for this visit.    Review of Systems  Constitutional:  Constitutional negative. HENT: HENT negative.  Eyes: Eyes negative.  Respiratory: Respiratory negative.  Cardiovascular: Cardiovascular negative.  GI: Gastrointestinal negative.  Skin: Positive for wound.  Neurological: Neurological negative. Hematologic: Hematologic/lymphatic negative.  Psychiatric: Psychiatric negative.        Objective:  Objective   Vitals:   03/22/20 1452  BP: 130/76  Pulse: 67  Resp: 14  Temp: 97.8 F (36.6 C)  TempSrc: Temporal  SpO2: 99%  Weight: 116 lb (52.6 kg)  Height: 5\' 3.5" (1.613 m)   Body mass index is 20.23 kg/m.  Physical Exam HENT:     Head: Normocephalic.     Nose:     Comments: Wearing a mask Eyes:     Pupils: Pupils are equal, round, and reactive to light.  Cardiovascular:     Pulses:          Popliteal pulses are 2+ on the right side and 2+ on the left side.       Dorsalis pedis pulses are 2+ on the right side.  Pulmonary:     Effort: Pulmonary effort is normal.  Abdominal:     General: Abdomen is flat.  Musculoskeletal:     Comments: Strong thrill left upper arm AV fistula  Right medial plantar surface with ulceration no drainage no underlying fluctuance  Skin:    General: Skin is dry.     Capillary Refill: Capillary refill takes less than 2 seconds.  Neurological:     General: No focal deficit present.     Mental Status: She is alert.  Psychiatric:        Mood and Affect: Mood normal.        Behavior: Behavior normal.        Thought Content: Thought content normal.        Judgment: Judgment normal.     Data: ABI Summary:  Right: Resting right ankle-brachial index is within normal range. No  evidence of significant right lower extremity arterial disease.   Left: Resting left ankle-brachial index indicates noncompressible left  lower extremity arteries.    Posterior tibial waveform absent suggestive of proximal occlusion.  Anterior tibial and peroneal arteries are non compressible but demonstrate  triphasic Doppler waveforms.      Assessment/Plan:     69'[id number]$  year old female with a new area of ulceration or callus on her right foot near her metatarsal site of a previous first toe amputation.  She does have a palpable dorsalis pedis pulse there and her ABIs within normal range.  We will refer her to podiatry possibly they can get her  a better fitting shoe or an insert or orthotic to fit the foot to prevent any further callus or tissue loss.  She can follow-up with me on an as-needed basis.     Waynetta Sandy MD [facility]

## 2020-03-25 ENCOUNTER — Other Ambulatory Visit: Payer: Self-pay

## 2020-03-25 ENCOUNTER — Ambulatory Visit (INDEPENDENT_AMBULATORY_CARE_PROVIDER_SITE_OTHER): Payer: Medicare Other

## 2020-03-25 ENCOUNTER — Ambulatory Visit: Payer: Medicare Other | Admitting: Podiatry

## 2020-03-25 DIAGNOSIS — L84 Corns and callosities: Secondary | ICD-10-CM

## 2020-03-25 DIAGNOSIS — M79674 Pain in right toe(s): Secondary | ICD-10-CM | POA: Diagnosis not present

## 2020-03-25 DIAGNOSIS — E0843 Diabetes mellitus due to underlying condition with diabetic autonomic (poly)neuropathy: Secondary | ICD-10-CM | POA: Diagnosis not present

## 2020-03-25 DIAGNOSIS — L97512 Non-pressure chronic ulcer of other part of right foot with fat layer exposed: Secondary | ICD-10-CM

## 2020-03-25 DIAGNOSIS — M79675 Pain in left toe(s): Secondary | ICD-10-CM

## 2020-03-25 DIAGNOSIS — B351 Tinea unguium: Secondary | ICD-10-CM

## 2020-03-25 MED ORDER — GENTAMICIN SULFATE 0.1 % EX CREA: 1.0000 "application " | TOPICAL_CREAM | Freq: Two times a day (BID) | CUTANEOUS | 1 refills | Status: DC

## 2020-03-25 NOTE — Progress Notes (Signed)
SUBJECTIVE Patient with a history of diabetes mellitus presents to office today as a new patient for evaluation of a wound that is developed to the plantar aspect of the right foot for the last several weeks.  They have been applying A&D ointment and a light dressing on top of the wound daily.  She presents today with her husband.  Patient also complaining of elongated, thickened nails that cause pain while ambulating in shoes.  She is unable to trim her own nails. Patient is here for further evaluation and treatment.   Past Medical History:  Diagnosis Date  . Anemia    patient preference - stopped iron   . ARF (acute renal failure) ([place]) 04/2016   dehydration  . Chronic kidney disease    stage 5  . Depression   . Diabetes mellitus    Type II - pt preference - stopped lantus  . Diabetic retinopathy ([place])   . Diverticulitis   . History of kidney stones    passed- 6  . Hypertension   . Osteomyelitis ([place])   . Vitamin B 12 deficiency 06/09/2017  . Vitamin D deficiency   . Wears partial dentures    upper    OBJECTIVE General Patient is awake, alert, and oriented x 3 and in no acute distress. Derm wound #1 noted to the plantar aspect of the right medial longitudinal arch measuring approximately 3.0 x 3.0 x 0.1 cm.  To the noted ulceration there is no eschar.  There is a minimal amount of slough fibrin and necrotic tissue noted.  Granulation tissue and wound base is red.  There is no exposed bone muscle tendon ligament or joint.  No malodor noted.  Minimal serous drainage noted.  Periwound integrity is intact.   Skin is dry and supple bilateral. Negative open lesions or macerations. Remaining integument unremarkable. Nails are tender, long, thickened and dystrophic with subungual debris, consistent with onychomycosis, 1-5 bilateral. No signs of infection noted. Vasc  DP and PT pedal pulses palpable bilaterally. Temperature gradient within normal limits.  Neuro Epicritic and protective  threshold sensation diminished bilaterally.  Musculoskeletal Exam H/o right first ray amputation.  H/0 left great toe amputation.  Hammertoe contracture deformities noted 2-5 bilateral.  Collapse of the medial longitudinal arch with a hypertrophic bone spur noted right foot.  This is contributory to pressure causing the ulcer to develop on the arch of the right foot Radiographic exam Amputation of the first ray noted right foot.  Disarticulation at the level of the medial cuneiform metatarsal joint.  There does appear to be some plantar flexion and prominence of the medial cuneiform contributory to the clinical ulcer that is developing.  Diffuse degenerative changes noted throughout the foot.  No fracture identified.  No cortical erosion or evidence of osteomyelitis.  ASSESSMENT 1. Diabetes Mellitus w/ peripheral neuropathy 2. Onychomycosis of nail due to dermatophyte bilateral 3.  Ulcer right foot secondary to diabetes mellitus   PLAN OF CARE 1. Patient evaluated today. 2. Instructed to maintain good pedal hygiene and foot care. Stressed importance of controlling blood sugar.  3. Mechanical debridement of nails 2-5 bilaterally performed using a nail nipper. Filed with dremel without incident.  4.  Medically necessary excisional debridement including subcutaneous tissue was performed using a tissue nipper.  Excisional debridement of all the necrotic nonviable tissue down to the healthy bleeding viable tissue was performed with post debridement measurement same as.   5.  Prescription for gentamicin cream applied daily 6.  Return  to clinic in 3 weeks    Edrick Kins, DPM [facility]  Dr. Edrick Kins, [address]                                        [place], [place] [address]                Office [phone]  Fax [phone]

## 2020-04-02 ENCOUNTER — Other Ambulatory Visit: Payer: Self-pay | Admitting: Podiatry

## 2020-04-02 DIAGNOSIS — L97512 Non-pressure chronic ulcer of other part of right foot with fat layer exposed: Secondary | ICD-10-CM

## 2020-04-17 ENCOUNTER — Ambulatory Visit: Payer: Medicare Other | Admitting: Podiatry

## 2020-09-13 DIAGNOSIS — N184 Chronic kidney disease, stage 4 (severe): Secondary | ICD-10-CM | POA: Diagnosis not present

## 2020-09-13 DIAGNOSIS — I10 Essential (primary) hypertension: Secondary | ICD-10-CM | POA: Diagnosis not present

## 2021-02-25 DIAGNOSIS — D509 Iron deficiency anemia, unspecified: Secondary | ICD-10-CM | POA: Diagnosis not present

## 2021-02-25 DIAGNOSIS — Z992 Dependence on renal dialysis: Secondary | ICD-10-CM | POA: Diagnosis not present

## 2021-02-25 DIAGNOSIS — N186 End stage renal disease: Secondary | ICD-10-CM | POA: Diagnosis not present

## 2021-02-25 DIAGNOSIS — D631 Anemia in chronic kidney disease: Secondary | ICD-10-CM | POA: Diagnosis not present

## 2021-02-25 DIAGNOSIS — N189 Chronic kidney disease, unspecified: Secondary | ICD-10-CM | POA: Diagnosis not present

## 2021-02-25 DIAGNOSIS — N2581 Secondary hyperparathyroidism of renal origin: Secondary | ICD-10-CM | POA: Diagnosis not present

## 2021-02-25 DIAGNOSIS — E1122 Type 2 diabetes mellitus with diabetic chronic kidney disease: Secondary | ICD-10-CM | POA: Diagnosis not present

## 2021-02-27 DIAGNOSIS — D509 Iron deficiency anemia, unspecified: Secondary | ICD-10-CM | POA: Diagnosis not present

## 2021-02-27 DIAGNOSIS — N186 End stage renal disease: Secondary | ICD-10-CM | POA: Diagnosis not present

## 2021-02-27 DIAGNOSIS — N189 Chronic kidney disease, unspecified: Secondary | ICD-10-CM | POA: Diagnosis not present

## 2021-02-27 DIAGNOSIS — Z992 Dependence on renal dialysis: Secondary | ICD-10-CM | POA: Diagnosis not present

## 2021-02-27 DIAGNOSIS — N2581 Secondary hyperparathyroidism of renal origin: Secondary | ICD-10-CM | POA: Diagnosis not present

## 2021-02-27 DIAGNOSIS — E1122 Type 2 diabetes mellitus with diabetic chronic kidney disease: Secondary | ICD-10-CM | POA: Diagnosis not present

## 2021-03-01 DIAGNOSIS — E1122 Type 2 diabetes mellitus with diabetic chronic kidney disease: Secondary | ICD-10-CM | POA: Diagnosis not present

## 2021-03-01 DIAGNOSIS — Z992 Dependence on renal dialysis: Secondary | ICD-10-CM | POA: Diagnosis not present

## 2021-03-01 DIAGNOSIS — N186 End stage renal disease: Secondary | ICD-10-CM | POA: Diagnosis not present

## 2021-03-01 DIAGNOSIS — N189 Chronic kidney disease, unspecified: Secondary | ICD-10-CM | POA: Diagnosis not present

## 2021-03-01 DIAGNOSIS — D509 Iron deficiency anemia, unspecified: Secondary | ICD-10-CM | POA: Diagnosis not present

## 2021-03-01 DIAGNOSIS — N2581 Secondary hyperparathyroidism of renal origin: Secondary | ICD-10-CM | POA: Diagnosis not present

## 2021-03-04 DIAGNOSIS — N189 Chronic kidney disease, unspecified: Secondary | ICD-10-CM | POA: Diagnosis not present

## 2021-03-04 DIAGNOSIS — E1122 Type 2 diabetes mellitus with diabetic chronic kidney disease: Secondary | ICD-10-CM | POA: Diagnosis not present

## 2021-03-04 DIAGNOSIS — Z992 Dependence on renal dialysis: Secondary | ICD-10-CM | POA: Diagnosis not present

## 2021-03-04 DIAGNOSIS — N2581 Secondary hyperparathyroidism of renal origin: Secondary | ICD-10-CM | POA: Diagnosis not present

## 2021-03-04 DIAGNOSIS — D509 Iron deficiency anemia, unspecified: Secondary | ICD-10-CM | POA: Diagnosis not present

## 2021-03-04 DIAGNOSIS — N186 End stage renal disease: Secondary | ICD-10-CM | POA: Diagnosis not present

## 2021-03-06 DIAGNOSIS — D509 Iron deficiency anemia, unspecified: Secondary | ICD-10-CM | POA: Diagnosis not present

## 2021-03-06 DIAGNOSIS — N2581 Secondary hyperparathyroidism of renal origin: Secondary | ICD-10-CM | POA: Diagnosis not present

## 2021-03-06 DIAGNOSIS — N189 Chronic kidney disease, unspecified: Secondary | ICD-10-CM | POA: Diagnosis not present

## 2021-03-06 DIAGNOSIS — N186 End stage renal disease: Secondary | ICD-10-CM | POA: Diagnosis not present

## 2021-03-06 DIAGNOSIS — E1122 Type 2 diabetes mellitus with diabetic chronic kidney disease: Secondary | ICD-10-CM | POA: Diagnosis not present

## 2021-03-06 DIAGNOSIS — Z992 Dependence on renal dialysis: Secondary | ICD-10-CM | POA: Diagnosis not present

## 2021-03-08 DIAGNOSIS — Z992 Dependence on renal dialysis: Secondary | ICD-10-CM | POA: Diagnosis not present

## 2021-03-08 DIAGNOSIS — N186 End stage renal disease: Secondary | ICD-10-CM | POA: Diagnosis not present

## 2021-03-08 DIAGNOSIS — D509 Iron deficiency anemia, unspecified: Secondary | ICD-10-CM | POA: Diagnosis not present

## 2021-03-08 DIAGNOSIS — N2581 Secondary hyperparathyroidism of renal origin: Secondary | ICD-10-CM | POA: Diagnosis not present

## 2021-03-08 DIAGNOSIS — N189 Chronic kidney disease, unspecified: Secondary | ICD-10-CM | POA: Diagnosis not present

## 2021-03-08 DIAGNOSIS — E1122 Type 2 diabetes mellitus with diabetic chronic kidney disease: Secondary | ICD-10-CM | POA: Diagnosis not present

## 2021-03-11 DIAGNOSIS — N186 End stage renal disease: Secondary | ICD-10-CM | POA: Diagnosis not present

## 2021-03-11 DIAGNOSIS — N2581 Secondary hyperparathyroidism of renal origin: Secondary | ICD-10-CM | POA: Diagnosis not present

## 2021-03-11 DIAGNOSIS — D509 Iron deficiency anemia, unspecified: Secondary | ICD-10-CM | POA: Diagnosis not present

## 2021-03-11 DIAGNOSIS — Z992 Dependence on renal dialysis: Secondary | ICD-10-CM | POA: Diagnosis not present

## 2021-03-11 DIAGNOSIS — N189 Chronic kidney disease, unspecified: Secondary | ICD-10-CM | POA: Diagnosis not present

## 2021-03-11 DIAGNOSIS — E1122 Type 2 diabetes mellitus with diabetic chronic kidney disease: Secondary | ICD-10-CM | POA: Diagnosis not present

## 2021-03-13 DIAGNOSIS — N186 End stage renal disease: Secondary | ICD-10-CM | POA: Diagnosis not present

## 2021-03-13 DIAGNOSIS — N189 Chronic kidney disease, unspecified: Secondary | ICD-10-CM | POA: Diagnosis not present

## 2021-03-13 DIAGNOSIS — E1122 Type 2 diabetes mellitus with diabetic chronic kidney disease: Secondary | ICD-10-CM | POA: Diagnosis not present

## 2021-03-13 DIAGNOSIS — Z992 Dependence on renal dialysis: Secondary | ICD-10-CM | POA: Diagnosis not present

## 2021-03-13 DIAGNOSIS — N2581 Secondary hyperparathyroidism of renal origin: Secondary | ICD-10-CM | POA: Diagnosis not present

## 2021-03-13 DIAGNOSIS — D509 Iron deficiency anemia, unspecified: Secondary | ICD-10-CM | POA: Diagnosis not present

## 2021-03-15 DIAGNOSIS — E1122 Type 2 diabetes mellitus with diabetic chronic kidney disease: Secondary | ICD-10-CM | POA: Diagnosis not present

## 2021-03-15 DIAGNOSIS — N189 Chronic kidney disease, unspecified: Secondary | ICD-10-CM | POA: Diagnosis not present

## 2021-03-15 DIAGNOSIS — D509 Iron deficiency anemia, unspecified: Secondary | ICD-10-CM | POA: Diagnosis not present

## 2021-03-15 DIAGNOSIS — Z992 Dependence on renal dialysis: Secondary | ICD-10-CM | POA: Diagnosis not present

## 2021-03-15 DIAGNOSIS — N2581 Secondary hyperparathyroidism of renal origin: Secondary | ICD-10-CM | POA: Diagnosis not present

## 2021-03-15 DIAGNOSIS — N186 End stage renal disease: Secondary | ICD-10-CM | POA: Diagnosis not present

## 2021-03-18 DIAGNOSIS — Z992 Dependence on renal dialysis: Secondary | ICD-10-CM | POA: Diagnosis not present

## 2021-03-18 DIAGNOSIS — N186 End stage renal disease: Secondary | ICD-10-CM | POA: Diagnosis not present

## 2021-03-18 DIAGNOSIS — N189 Chronic kidney disease, unspecified: Secondary | ICD-10-CM | POA: Diagnosis not present

## 2021-03-18 DIAGNOSIS — N2581 Secondary hyperparathyroidism of renal origin: Secondary | ICD-10-CM | POA: Diagnosis not present

## 2021-03-18 DIAGNOSIS — D509 Iron deficiency anemia, unspecified: Secondary | ICD-10-CM | POA: Diagnosis not present

## 2021-03-18 DIAGNOSIS — E1122 Type 2 diabetes mellitus with diabetic chronic kidney disease: Secondary | ICD-10-CM | POA: Diagnosis not present

## 2021-03-20 DIAGNOSIS — E1122 Type 2 diabetes mellitus with diabetic chronic kidney disease: Secondary | ICD-10-CM | POA: Diagnosis not present

## 2021-03-20 DIAGNOSIS — N2581 Secondary hyperparathyroidism of renal origin: Secondary | ICD-10-CM | POA: Diagnosis not present

## 2021-03-20 DIAGNOSIS — D509 Iron deficiency anemia, unspecified: Secondary | ICD-10-CM | POA: Diagnosis not present

## 2021-03-20 DIAGNOSIS — Z992 Dependence on renal dialysis: Secondary | ICD-10-CM | POA: Diagnosis not present

## 2021-03-20 DIAGNOSIS — N189 Chronic kidney disease, unspecified: Secondary | ICD-10-CM | POA: Diagnosis not present

## 2021-03-20 DIAGNOSIS — N186 End stage renal disease: Secondary | ICD-10-CM | POA: Diagnosis not present

## 2021-03-22 DIAGNOSIS — E1122 Type 2 diabetes mellitus with diabetic chronic kidney disease: Secondary | ICD-10-CM | POA: Diagnosis not present

## 2021-03-22 DIAGNOSIS — Z992 Dependence on renal dialysis: Secondary | ICD-10-CM | POA: Diagnosis not present

## 2021-03-22 DIAGNOSIS — N2581 Secondary hyperparathyroidism of renal origin: Secondary | ICD-10-CM | POA: Diagnosis not present

## 2021-03-22 DIAGNOSIS — N189 Chronic kidney disease, unspecified: Secondary | ICD-10-CM | POA: Diagnosis not present

## 2021-03-22 DIAGNOSIS — N186 End stage renal disease: Secondary | ICD-10-CM | POA: Diagnosis not present

## 2021-03-22 DIAGNOSIS — D509 Iron deficiency anemia, unspecified: Secondary | ICD-10-CM | POA: Diagnosis not present

## 2021-03-25 DIAGNOSIS — E1122 Type 2 diabetes mellitus with diabetic chronic kidney disease: Secondary | ICD-10-CM | POA: Diagnosis not present

## 2021-03-25 DIAGNOSIS — D509 Iron deficiency anemia, unspecified: Secondary | ICD-10-CM | POA: Diagnosis not present

## 2021-03-25 DIAGNOSIS — N189 Chronic kidney disease, unspecified: Secondary | ICD-10-CM | POA: Diagnosis not present

## 2021-03-25 DIAGNOSIS — N186 End stage renal disease: Secondary | ICD-10-CM | POA: Diagnosis not present

## 2021-03-25 DIAGNOSIS — Z992 Dependence on renal dialysis: Secondary | ICD-10-CM | POA: Diagnosis not present

## 2021-03-25 DIAGNOSIS — E1129 Type 2 diabetes mellitus with other diabetic kidney complication: Secondary | ICD-10-CM | POA: Diagnosis not present

## 2021-03-25 DIAGNOSIS — N2581 Secondary hyperparathyroidism of renal origin: Secondary | ICD-10-CM | POA: Diagnosis not present

## 2021-03-26 DIAGNOSIS — N186 End stage renal disease: Secondary | ICD-10-CM | POA: Diagnosis not present

## 2021-03-26 DIAGNOSIS — E1129 Type 2 diabetes mellitus with other diabetic kidney complication: Secondary | ICD-10-CM | POA: Diagnosis not present

## 2021-03-26 DIAGNOSIS — Z992 Dependence on renal dialysis: Secondary | ICD-10-CM | POA: Diagnosis not present

## 2021-03-27 DIAGNOSIS — N2581 Secondary hyperparathyroidism of renal origin: Secondary | ICD-10-CM | POA: Diagnosis not present

## 2021-03-27 DIAGNOSIS — N189 Chronic kidney disease, unspecified: Secondary | ICD-10-CM | POA: Diagnosis not present

## 2021-03-27 DIAGNOSIS — D631 Anemia in chronic kidney disease: Secondary | ICD-10-CM | POA: Diagnosis not present

## 2021-03-27 DIAGNOSIS — N186 End stage renal disease: Secondary | ICD-10-CM | POA: Diagnosis not present

## 2021-03-27 DIAGNOSIS — D509 Iron deficiency anemia, unspecified: Secondary | ICD-10-CM | POA: Diagnosis not present

## 2021-03-27 DIAGNOSIS — Z992 Dependence on renal dialysis: Secondary | ICD-10-CM | POA: Diagnosis not present

## 2021-03-27 DIAGNOSIS — E1122 Type 2 diabetes mellitus with diabetic chronic kidney disease: Secondary | ICD-10-CM | POA: Diagnosis not present

## 2021-03-29 DIAGNOSIS — D631 Anemia in chronic kidney disease: Secondary | ICD-10-CM | POA: Diagnosis not present

## 2021-03-29 DIAGNOSIS — N186 End stage renal disease: Secondary | ICD-10-CM | POA: Diagnosis not present

## 2021-03-29 DIAGNOSIS — N2581 Secondary hyperparathyroidism of renal origin: Secondary | ICD-10-CM | POA: Diagnosis not present

## 2021-03-29 DIAGNOSIS — D509 Iron deficiency anemia, unspecified: Secondary | ICD-10-CM | POA: Diagnosis not present

## 2021-03-29 DIAGNOSIS — N189 Chronic kidney disease, unspecified: Secondary | ICD-10-CM | POA: Diagnosis not present

## 2021-03-29 DIAGNOSIS — Z992 Dependence on renal dialysis: Secondary | ICD-10-CM | POA: Diagnosis not present

## 2021-04-01 DIAGNOSIS — D509 Iron deficiency anemia, unspecified: Secondary | ICD-10-CM | POA: Diagnosis not present

## 2021-04-01 DIAGNOSIS — N2581 Secondary hyperparathyroidism of renal origin: Secondary | ICD-10-CM | POA: Diagnosis not present

## 2021-04-01 DIAGNOSIS — N189 Chronic kidney disease, unspecified: Secondary | ICD-10-CM | POA: Diagnosis not present

## 2021-04-01 DIAGNOSIS — N186 End stage renal disease: Secondary | ICD-10-CM | POA: Diagnosis not present

## 2021-04-01 DIAGNOSIS — D631 Anemia in chronic kidney disease: Secondary | ICD-10-CM | POA: Diagnosis not present

## 2021-04-01 DIAGNOSIS — Z992 Dependence on renal dialysis: Secondary | ICD-10-CM | POA: Diagnosis not present

## 2021-04-03 DIAGNOSIS — D509 Iron deficiency anemia, unspecified: Secondary | ICD-10-CM | POA: Diagnosis not present

## 2021-04-03 DIAGNOSIS — D631 Anemia in chronic kidney disease: Secondary | ICD-10-CM | POA: Diagnosis not present

## 2021-04-03 DIAGNOSIS — N2581 Secondary hyperparathyroidism of renal origin: Secondary | ICD-10-CM | POA: Diagnosis not present

## 2021-04-03 DIAGNOSIS — N189 Chronic kidney disease, unspecified: Secondary | ICD-10-CM | POA: Diagnosis not present

## 2021-04-03 DIAGNOSIS — Z992 Dependence on renal dialysis: Secondary | ICD-10-CM | POA: Diagnosis not present

## 2021-04-03 DIAGNOSIS — N186 End stage renal disease: Secondary | ICD-10-CM | POA: Diagnosis not present

## 2021-04-05 DIAGNOSIS — D509 Iron deficiency anemia, unspecified: Secondary | ICD-10-CM | POA: Diagnosis not present

## 2021-04-05 DIAGNOSIS — Z992 Dependence on renal dialysis: Secondary | ICD-10-CM | POA: Diagnosis not present

## 2021-04-05 DIAGNOSIS — D631 Anemia in chronic kidney disease: Secondary | ICD-10-CM | POA: Diagnosis not present

## 2021-04-05 DIAGNOSIS — N2581 Secondary hyperparathyroidism of renal origin: Secondary | ICD-10-CM | POA: Diagnosis not present

## 2021-04-05 DIAGNOSIS — N186 End stage renal disease: Secondary | ICD-10-CM | POA: Diagnosis not present

## 2021-04-05 DIAGNOSIS — N189 Chronic kidney disease, unspecified: Secondary | ICD-10-CM | POA: Diagnosis not present

## 2021-04-08 DIAGNOSIS — D631 Anemia in chronic kidney disease: Secondary | ICD-10-CM | POA: Diagnosis not present

## 2021-04-08 DIAGNOSIS — Z992 Dependence on renal dialysis: Secondary | ICD-10-CM | POA: Diagnosis not present

## 2021-04-08 DIAGNOSIS — N189 Chronic kidney disease, unspecified: Secondary | ICD-10-CM | POA: Diagnosis not present

## 2021-04-08 DIAGNOSIS — N2581 Secondary hyperparathyroidism of renal origin: Secondary | ICD-10-CM | POA: Diagnosis not present

## 2021-04-08 DIAGNOSIS — N186 End stage renal disease: Secondary | ICD-10-CM | POA: Diagnosis not present

## 2021-04-08 DIAGNOSIS — D509 Iron deficiency anemia, unspecified: Secondary | ICD-10-CM | POA: Diagnosis not present

## 2021-04-10 DIAGNOSIS — N189 Chronic kidney disease, unspecified: Secondary | ICD-10-CM | POA: Diagnosis not present

## 2021-04-10 DIAGNOSIS — N2581 Secondary hyperparathyroidism of renal origin: Secondary | ICD-10-CM | POA: Diagnosis not present

## 2021-04-10 DIAGNOSIS — D509 Iron deficiency anemia, unspecified: Secondary | ICD-10-CM | POA: Diagnosis not present

## 2021-04-10 DIAGNOSIS — Z992 Dependence on renal dialysis: Secondary | ICD-10-CM | POA: Diagnosis not present

## 2021-04-10 DIAGNOSIS — D631 Anemia in chronic kidney disease: Secondary | ICD-10-CM | POA: Diagnosis not present

## 2021-04-10 DIAGNOSIS — N186 End stage renal disease: Secondary | ICD-10-CM | POA: Diagnosis not present

## 2021-04-12 DIAGNOSIS — D509 Iron deficiency anemia, unspecified: Secondary | ICD-10-CM | POA: Diagnosis not present

## 2021-04-12 DIAGNOSIS — N2581 Secondary hyperparathyroidism of renal origin: Secondary | ICD-10-CM | POA: Diagnosis not present

## 2021-04-12 DIAGNOSIS — N189 Chronic kidney disease, unspecified: Secondary | ICD-10-CM | POA: Diagnosis not present

## 2021-04-12 DIAGNOSIS — N186 End stage renal disease: Secondary | ICD-10-CM | POA: Diagnosis not present

## 2021-04-12 DIAGNOSIS — Z992 Dependence on renal dialysis: Secondary | ICD-10-CM | POA: Diagnosis not present

## 2021-04-12 DIAGNOSIS — D631 Anemia in chronic kidney disease: Secondary | ICD-10-CM | POA: Diagnosis not present

## 2021-04-15 DIAGNOSIS — N186 End stage renal disease: Secondary | ICD-10-CM | POA: Diagnosis not present

## 2021-04-15 DIAGNOSIS — D509 Iron deficiency anemia, unspecified: Secondary | ICD-10-CM | POA: Diagnosis not present

## 2021-04-15 DIAGNOSIS — Z992 Dependence on renal dialysis: Secondary | ICD-10-CM | POA: Diagnosis not present

## 2021-04-15 DIAGNOSIS — D631 Anemia in chronic kidney disease: Secondary | ICD-10-CM | POA: Diagnosis not present

## 2021-04-15 DIAGNOSIS — N189 Chronic kidney disease, unspecified: Secondary | ICD-10-CM | POA: Diagnosis not present

## 2021-04-15 DIAGNOSIS — N2581 Secondary hyperparathyroidism of renal origin: Secondary | ICD-10-CM | POA: Diagnosis not present

## 2021-04-17 DIAGNOSIS — Z992 Dependence on renal dialysis: Secondary | ICD-10-CM | POA: Diagnosis not present

## 2021-04-17 DIAGNOSIS — N186 End stage renal disease: Secondary | ICD-10-CM | POA: Diagnosis not present

## 2021-04-17 DIAGNOSIS — D631 Anemia in chronic kidney disease: Secondary | ICD-10-CM | POA: Diagnosis not present

## 2021-04-17 DIAGNOSIS — D509 Iron deficiency anemia, unspecified: Secondary | ICD-10-CM | POA: Diagnosis not present

## 2021-04-17 DIAGNOSIS — N189 Chronic kidney disease, unspecified: Secondary | ICD-10-CM | POA: Diagnosis not present

## 2021-04-17 DIAGNOSIS — N2581 Secondary hyperparathyroidism of renal origin: Secondary | ICD-10-CM | POA: Diagnosis not present

## 2021-04-19 DIAGNOSIS — D509 Iron deficiency anemia, unspecified: Secondary | ICD-10-CM | POA: Diagnosis not present

## 2021-04-19 DIAGNOSIS — N186 End stage renal disease: Secondary | ICD-10-CM | POA: Diagnosis not present

## 2021-04-19 DIAGNOSIS — Z992 Dependence on renal dialysis: Secondary | ICD-10-CM | POA: Diagnosis not present

## 2021-04-19 DIAGNOSIS — N189 Chronic kidney disease, unspecified: Secondary | ICD-10-CM | POA: Diagnosis not present

## 2021-04-19 DIAGNOSIS — N2581 Secondary hyperparathyroidism of renal origin: Secondary | ICD-10-CM | POA: Diagnosis not present

## 2021-04-19 DIAGNOSIS — D631 Anemia in chronic kidney disease: Secondary | ICD-10-CM | POA: Diagnosis not present

## 2021-04-22 ENCOUNTER — Other Ambulatory Visit: Payer: Self-pay

## 2021-04-22 ENCOUNTER — Encounter: Payer: Self-pay | Admitting: Hematology and Oncology

## 2021-04-22 ENCOUNTER — Emergency Department (HOSPITAL_COMMUNITY): Payer: Medicare Other

## 2021-04-22 ENCOUNTER — Emergency Department (HOSPITAL_COMMUNITY)
Admission: EM | Admit: 2021-04-22 | Discharge: 2021-04-22 | Disposition: A | Discharge status: Home / Self Care | Payer: Medicare Other | Attending: Emergency Medicine | Admitting: Emergency Medicine

## 2021-04-22 ENCOUNTER — Encounter (HOSPITAL_COMMUNITY): Payer: Self-pay

## 2021-04-22 DIAGNOSIS — Z992 Dependence on renal dialysis: Secondary | ICD-10-CM | POA: Insufficient documentation

## 2021-04-22 DIAGNOSIS — R1031 Right lower quadrant pain: Secondary | ICD-10-CM | POA: Insufficient documentation

## 2021-04-22 DIAGNOSIS — K8689 Other specified diseases of pancreas: Secondary | ICD-10-CM | POA: Diagnosis not present

## 2021-04-22 DIAGNOSIS — K5730 Diverticulosis of large intestine without perforation or abscess without bleeding: Secondary | ICD-10-CM | POA: Diagnosis not present

## 2021-04-22 DIAGNOSIS — N2889 Other specified disorders of kidney and ureter: Secondary | ICD-10-CM | POA: Diagnosis not present

## 2021-04-22 DIAGNOSIS — R109 Unspecified abdominal pain: Secondary | ICD-10-CM | POA: Diagnosis not present

## 2021-04-22 DIAGNOSIS — Z5321 Procedure and treatment not carried out due to patient leaving prior to being seen by health care provider: Secondary | ICD-10-CM | POA: Insufficient documentation

## 2021-04-22 DIAGNOSIS — N186 End stage renal disease: Secondary | ICD-10-CM | POA: Diagnosis not present

## 2021-04-22 DIAGNOSIS — D1800 Hemangioma unspecified site: Secondary | ICD-10-CM | POA: Diagnosis not present

## 2021-04-22 LAB — CBC WITH DIFFERENTIAL/PLATELET
Abs Immature Granulocytes: 0.01 10*3/uL (ref 0.00–0.07)
Basophils Absolute: 0 10*3/uL (ref 0.0–0.1)
Basophils Relative: 1 %
Eosinophils Absolute: 0.2 10*3/uL (ref 0.0–0.5)
Eosinophils Relative: 5 %
HCT: 31.7 % — ABNORMAL LOW (ref 36.0–46.0)
Hemoglobin: 10.4 g/dL — ABNORMAL LOW (ref 12.0–15.0)
Immature Granulocytes: 0 %
Lymphocytes Relative: 43 %
Lymphs Abs: 2.3 10*3/uL (ref 0.7–4.0)
MCH: 31.9 pg (ref 26.0–34.0)
MCHC: 32.8 g/dL (ref 30.0–36.0)
MCV: 97.2 fL (ref 80.0–100.0)
Monocytes Absolute: 0.4 10*3/uL (ref 0.1–1.0)
Monocytes Relative: 8 %
Neutro Abs: 2.3 10*3/uL (ref 1.7–7.7)
Neutrophils Relative %: 43 %
Platelets: 123 10*3/uL — ABNORMAL LOW (ref 150–400)
RBC: 3.26 MIL/uL — ABNORMAL LOW (ref 3.87–5.11)
RDW: 13.4 % (ref 11.5–15.5)
WBC: 5.2 10*3/uL (ref 4.0–10.5)
nRBC: 0 % (ref 0.0–0.2)

## 2021-04-22 LAB — BASIC METABOLIC PANEL
Anion gap: 12 (ref 5–15)
BUN: 41 mg/dL — ABNORMAL HIGH (ref 8–23)
CO2: 33 mmol/L — ABNORMAL HIGH (ref 22–32)
Calcium: 9.5 mg/dL (ref 8.9–10.3)
Chloride: 95 mmol/L — ABNORMAL LOW (ref 98–111)
Creatinine, Ser: 5.33 mg/dL — ABNORMAL HIGH (ref 0.44–1.00)
GFR, Estimated: 8 mL/min — ABNORMAL LOW (ref >60)
Glucose, Bld: 105 mg/dL — ABNORMAL HIGH (ref 70–99)
Potassium: 3.8 mmol/L (ref 3.5–5.1)
Sodium: 140 mmol/L (ref 135–145)

## 2021-04-22 LAB — URINALYSIS, ROUTINE W REFLEX MICROSCOPIC
Bacteria, UA: NONE SEEN
Bilirubin Urine: NEGATIVE
Glucose, UA: 50 mg/dL — AB
Hgb urine dipstick: NEGATIVE
Ketones, ur: NEGATIVE mg/dL
Nitrite: NEGATIVE
Protein, ur: 100 mg/dL — AB
Specific Gravity, Urine: 1.006 (ref 1.005–1.030)
pH: 9 — ABNORMAL HIGH (ref 5.0–8.0)

## 2021-04-22 MED ORDER — OXYCODONE-ACETAMINOPHEN 5-325 MG PO TABS: 1.0000 | ORAL_TABLET | Freq: Once | ORAL | Status: DC

## 2021-04-22 NOTE — ED Notes (Signed)
Patient states she will just follow up with her primary care doctor

## 2021-04-22 NOTE — ED Triage Notes (Signed)
Pt states that she thinks she may have a kidney stone, pain in the R flank today, pt is also dialysis pt T, Thu, Sat

## 2021-04-22 NOTE — ED Provider Triage Note (Signed)
Emergency Medicine Provider Triage Evaluation Note  SKYLIE HIOTT , a 71 y.o. female  was evaluated in triage.  Pt complains of right flank pain, onset 4PM yesterday.  Pain is worsening.  No nausea/vomiting.  Hx stones in the past with similar symptoms.  ESRD on HD (schedule Tue, Thur, Sat).  Review of Systems  Positive: Flank pain Negative: fever  Physical Exam  BP (!) 221/76 (BP Location: Right Arm)    Pulse 67    Temp 97.8 F (36.6 C) (Oral)    Resp 16    LMP  (LMP Unknown)    SpO2 96%  Gen:   Awake, no distress   Resp:  Normal effort  MSK:   Moves extremities without difficulty  Other:  Dialysis fistula LUE, thrill present  Medical Decision Making  Medically screening exam initiated at 2:18 AM.  Appropriate orders placed.  DEZZIE BADILLA was informed that the remainder of the evaluation will be completed by another provider, this initial triage assessment does not replace that evaluation, and the importance of remaining in the ED until their evaluation is complete.  Right flank pain.  Hx stones.  Labs, UA, CT renal stone study.   Larene Pickett, PA-C 04/22/21 [phone]

## 2021-04-23 ENCOUNTER — Encounter (HOSPITAL_COMMUNITY): Payer: Self-pay

## 2021-04-23 ENCOUNTER — Other Ambulatory Visit: Payer: Self-pay

## 2021-04-23 ENCOUNTER — Emergency Department (HOSPITAL_COMMUNITY)
Admission: EM | Admit: 2021-04-23 | Discharge: 2021-04-23 | Disposition: A | Discharge status: Home / Self Care | Payer: Medicare Other | Attending: Emergency Medicine | Admitting: Emergency Medicine

## 2021-04-23 ENCOUNTER — Encounter: Payer: Self-pay | Admitting: Hematology and Oncology

## 2021-04-23 DIAGNOSIS — E1122 Type 2 diabetes mellitus with diabetic chronic kidney disease: Secondary | ICD-10-CM | POA: Diagnosis not present

## 2021-04-23 DIAGNOSIS — R7989 Other specified abnormal findings of blood chemistry: Secondary | ICD-10-CM | POA: Insufficient documentation

## 2021-04-23 DIAGNOSIS — R978 Other abnormal tumor markers: Secondary | ICD-10-CM | POA: Diagnosis not present

## 2021-04-23 DIAGNOSIS — Z992 Dependence on renal dialysis: Secondary | ICD-10-CM | POA: Diagnosis not present

## 2021-04-23 DIAGNOSIS — Z79899 Other long term (current) drug therapy: Secondary | ICD-10-CM | POA: Diagnosis not present

## 2021-04-23 DIAGNOSIS — E1129 Type 2 diabetes mellitus with other diabetic kidney complication: Secondary | ICD-10-CM | POA: Diagnosis not present

## 2021-04-23 DIAGNOSIS — M545 Low back pain, unspecified: Secondary | ICD-10-CM

## 2021-04-23 DIAGNOSIS — N186 End stage renal disease: Secondary | ICD-10-CM | POA: Insufficient documentation

## 2021-04-23 DIAGNOSIS — I120 Hypertensive chronic kidney disease with stage 5 chronic kidney disease or end stage renal disease: Secondary | ICD-10-CM | POA: Insufficient documentation

## 2021-04-23 DIAGNOSIS — M5459 Other low back pain: Secondary | ICD-10-CM | POA: Diagnosis not present

## 2021-04-23 LAB — CBC WITH DIFFERENTIAL/PLATELET
Abs Immature Granulocytes: 0.02 10*3/uL (ref 0.00–0.07)
Basophils Absolute: 0.1 10*3/uL (ref 0.0–0.1)
Basophils Relative: 1 %
Eosinophils Absolute: 0.2 10*3/uL (ref 0.0–0.5)
Eosinophils Relative: 4 %
HCT: 32 % — ABNORMAL LOW (ref 36.0–46.0)
Hemoglobin: 10.4 g/dL — ABNORMAL LOW (ref 12.0–15.0)
Immature Granulocytes: 0 %
Lymphocytes Relative: 35 %
Lymphs Abs: 2.1 10*3/uL (ref 0.7–4.0)
MCH: 31.6 pg (ref 26.0–34.0)
MCHC: 32.5 g/dL (ref 30.0–36.0)
MCV: 97.3 fL (ref 80.0–100.0)
Monocytes Absolute: 0.5 10*3/uL (ref 0.1–1.0)
Monocytes Relative: 8 %
Neutro Abs: 3.2 10*3/uL (ref 1.7–7.7)
Neutrophils Relative %: 52 %
Platelets: 124 10*3/uL — ABNORMAL LOW (ref 150–400)
RBC: 3.29 MIL/uL — ABNORMAL LOW (ref 3.87–5.11)
RDW: 13.5 % (ref 11.5–15.5)
WBC: 6 10*3/uL (ref 4.0–10.5)
nRBC: 0 % (ref 0.0–0.2)

## 2021-04-23 LAB — URINALYSIS, ROUTINE W REFLEX MICROSCOPIC
Bilirubin Urine: NEGATIVE
Glucose, UA: 50 mg/dL — AB
Hgb urine dipstick: NEGATIVE
Ketones, ur: NEGATIVE mg/dL
Nitrite: NEGATIVE
Protein, ur: >=300 mg/dL — AB
Specific Gravity, Urine: 1.01 (ref 1.005–1.030)
pH: 9 — ABNORMAL HIGH (ref 5.0–8.0)

## 2021-04-23 LAB — BASIC METABOLIC PANEL
Anion gap: 11 (ref 5–15)
BUN: 49 mg/dL — ABNORMAL HIGH (ref 8–23)
CO2: 30 mmol/L (ref 22–32)
Calcium: 9 mg/dL (ref 8.9–10.3)
Chloride: 96 mmol/L — ABNORMAL LOW (ref 98–111)
Creatinine, Ser: 6.31 mg/dL — ABNORMAL HIGH (ref 0.44–1.00)
GFR, Estimated: 7 mL/min — ABNORMAL LOW (ref >60)
Glucose, Bld: 118 mg/dL — ABNORMAL HIGH (ref 70–99)
Potassium: 4 mmol/L (ref 3.5–5.1)
Sodium: 137 mmol/L (ref 135–145)

## 2021-04-23 MED ORDER — OXYCODONE-ACETAMINOPHEN 5-325 MG PO TABS
1.0000 | ORAL_TABLET | Freq: Once | ORAL | Status: AC
  Administered 2021-04-23: 1 via ORAL
  Filled 2021-04-23: qty 1

## 2021-04-23 NOTE — ED Notes (Signed)
Patient verbalizes understanding of discharge instructions. Opportunity for questioning and answers were provided. Armband removed by staff, pt discharged from ED and ambulated to lobby to return to lobby to go home with SO.  ?

## 2021-04-23 NOTE — ED Provider Triage Note (Signed)
Emergency Medicine Provider Triage Evaluation Note ? ?Bailey [name] , a 71 y.o. female  was evaluated in triage.  Pt complains of right-sided back pain.  That has been present for the past 2 days.  Seen in the ED last night for the same and have labs and a CT scan done but left prior to being seen.  Reports continued pain, husband provides majority of history.  Was supposed to have dialysis today, but did not receive this since she was at the hospital late yesterday.  Denies associated abdominal pain, dysuria, hematuria.  Reports pain sometimes makes her nauseous, no relief with over-the-counter medications.  Does not have prior history of back problems. ? ?Review of Systems  ?Positive: Back pain, flank pain, nausea ?Negative: Fever, abdominal pain, vomiting, numbness, weakness ? ?Physical Exam  ?BP (!) 222/65 (BP Location: Right Arm)   Pulse 70   Temp 97.8 ?F (36.6 ?C) (Oral)   Resp 17   Ht $R'5\' 4"'YV$  (1.626 m)   Wt 52.2 kg   LMP  (LMP Unknown)   SpO2 97%   BMI 19.74 kg/m?  ?Gen:   Awake, no distress   ?Resp:  Normal effort  ?MSK:   Moves extremities without difficulty, tenderness over the right mid to low back, no midline spinal tenderness ?Other:  Abdomen soft, nontender ? ?Medical Decision Making  ?Medically screening exam initiated at 2:07 AM.  Appropriate orders placed.  SHAKEILA PFARR was informed that the remainder of the evaluation will be completed by another provider, this initial triage assessment does not replace that evaluation, and the importance of remaining in the ED until their evaluation is complete. ? ? ?  ?Jacqlyn Larsen, PA-C ?04/23/21 0221 ? ?

## 2021-04-23 NOTE — ED Provider Notes (Addendum)
[facility] EMERGENCY DEPARTMENT Provider Note   CSN: [id number] Arrival date & time: 04/23/21  0136     History  Chief Complaint  Patient presents with   Back Pain    Bailey [name] is a 71 y.o. female.   Back Pain Patient presents for right-sided back pain.  This pain started 2 days ago.  She has been concerned of kidney stones, which she does have a history of.  Currently, patient has ESRD and does undergo HD on T, TH, and SA.  She does continue to make urine.  She denies any recent dysuria.  On the night before last, patient came to the ED.  She did undergo a work-up that included lab work and a CT scan.  Due to long waiting room time, patient went home.  She presented to the ED again last night due to recurrence of pain.  Patient again had a prolonged waiting room time and at time of being bedded in the ED reports resolution of pain.  The last analgesia she had was 6 hours ago.  Patient is currently asymptomatic.    Home Medications Prior to Admission medications   Medication Sig Start Date End Date Taking? Authorizing Provider  acetaminophen (TYLENOL) 500 MG tablet Take 1,000 mg by mouth every 6 (six) hours as needed for fever or headache (pain).     [provider]  amlodipine-atorvastatin (CADUET) 10-10 MG tablet Take 1 tablet by mouth daily. 2.5 mg at night per husband    [provider]  carvedilol (COREG) 3.125 MG tablet Take 1 tablet (3.125 mg total) by mouth 2 (two) times daily with a meal. 09/08/19   Shelly Coss, MD  Cholecalciferol (VITAMIN D-3) 125 MCG (5000 UT) TABS Take 5,000 Units by mouth 2 (two) times daily.    [provider]  cyanocobalamin (,VITAMIN B-12,) 1000 MCG/ML injection Inject 1,000 mcg into the muscle every 30 (thirty) days.    [provider]  escitalopram (LEXAPRO) 5 MG tablet Take 5 mg by mouth daily as needed (anxiety).    [provider]  gentamicin cream (GARAMYCIN) 0.1 % Apply 1  application topically 2 (two) times daily. 03/25/20   Edrick Kins, DPM  NIFEdipine (ADALAT CC) 30 MG 24 hr tablet Take 30 mg by mouth daily. 08/11/19   [provider]  sodium bicarbonate 650 MG tablet Take 650 mg by mouth 2 (two) times daily.    [provider]  Vitamin D, Ergocalciferol, (DRISDOL) 1.25 MG (50000 UNIT) CAPS capsule Take 50,000 Units by mouth every Sunday.     [provider]      Allergies    Patient has no known allergies.    Review of Systems   Review of Systems  Constitutional:  Positive for fatigue.  Musculoskeletal:  Positive for back pain.  All other systems reviewed and are negative.  Physical Exam Updated Vital Signs BP (!) 193/73    Pulse 62    Temp 98.1 F (36.7 C) (Oral)    Resp 14    Ht $R'5\' 4"'sd$  (1.626 m)    Wt 52.2 kg    LMP  (LMP Unknown)    SpO2 100%    BMI 19.74 kg/m  Physical Exam Vitals and nursing note reviewed.  Constitutional:      General: She is not in acute distress.    Appearance: She is well-developed. She is not ill-appearing, toxic-appearing or diaphoretic.  HENT:     Head: Normocephalic and atraumatic.  Right Ear: External ear normal.     Left Ear: External ear normal.     Nose: Nose normal.     Mouth/Throat:     Mouth: Mucous membranes are moist.     Pharynx: Oropharynx is clear.  Eyes:     Extraocular Movements: Extraocular movements intact.     Conjunctiva/sclera: Conjunctivae normal.  Cardiovascular:     Rate and Rhythm: Normal rate and regular rhythm.     Heart sounds: No murmur heard. Pulmonary:     Effort: Pulmonary effort is normal. No respiratory distress.     Breath sounds: Normal breath sounds.  Abdominal:     Palpations: Abdomen is soft.     Tenderness: There is no abdominal tenderness. There is no right CVA tenderness or left CVA tenderness.  Musculoskeletal:        General: No swelling or tenderness. Normal range of motion.     Cervical back: Normal range of motion and neck supple.   Skin:    General: Skin is warm and dry.     Capillary Refill: Capillary refill takes less than 2 seconds.     Coloration: Skin is not jaundiced or pale.  Neurological:     General: No focal deficit present.     Mental Status: She is alert and oriented to person, place, and time.     Cranial Nerves: No cranial nerve deficit.     Sensory: No sensory deficit.     Motor: No weakness.     Gait: Gait normal.  Psychiatric:        Mood and Affect: Mood normal.        Behavior: Behavior normal.        Thought Content: Thought content normal.        Judgment: Judgment normal.    ED Results / Procedures / Treatments   Labs (all labs ordered are listed, but only abnormal results are displayed) Labs Reviewed  BASIC METABOLIC PANEL - Abnormal; Notable for the following components:      Result Value   Chloride 96 (*)    Glucose, Bld 118 (*)    BUN 49 (*)    Creatinine, Ser 6.31 (*)    GFR, Estimated 7 (*)    All other components within normal limits  CBC WITH DIFFERENTIAL/PLATELET - Abnormal; Notable for the following components:   RBC 3.29 (*)    Hemoglobin 10.4 (*)    HCT 32.0 (*)    Platelets 124 (*)    All other components within normal limits  URINALYSIS, ROUTINE W REFLEX MICROSCOPIC - Abnormal; Notable for the following components:   APPearance HAZY (*)    pH 9.0 (*)    Glucose, UA 50 (*)    Protein, ur >=300 (*)    Leukocytes,Ua MODERATE (*)    Bacteria, UA RARE (*)    All other components within normal limits    EKG None  Radiology CT Renal Stone Study  Result Date: 04/22/2021 CLINICAL DATA:  Right flank pain EXAM: CT ABDOMEN AND PELVIS WITHOUT CONTRAST TECHNIQUE: Multidetector CT imaging of the abdomen and pelvis was performed following the standard protocol without IV contrast. RADIATION DOSE REDUCTION: This exam was performed according to the departmental dose-optimization program which includes automated exposure control, adjustment of the mA and/or kV according to  patient size and/or use of iterative reconstruction technique. COMPARISON:  None. FINDINGS: Lower chest: No acute abnormality. Hepatobiliary: No focal liver abnormality is seen. No gallstones, gallbladder wall thickening, or biliary dilatation.  Pancreas: Atrophic but otherwise unremarkable Spleen: Unremarkable Adrenals/Urinary Tract: The adrenal glands are unremarkable. The kidneys are normal in position bilateral lipid demonstrate moderate parenchymal atrophy in keeping with given history of chronic renal insufficiency. Punctate calcifications within the kidneys bilaterally may represent tiny 1-2 mm nonobstructing caliceal calculi or vascular calcifications. There is no hydronephrosis. There are no ureteral calculi. The bladder is unremarkable. Stomach/Bowel: Pancolonic diverticulosis without acute inflammatory change. Partial small bowel resection noted with multiple anastomotic staple lines noted within the periumbilical region. No evidence of obstruction or focal inflammation. Stomach, small bowel, and large bowel are otherwise unremarkable. Appendix normal. Vascular/Lymphatic: Mild aortoiliac atherosclerotic calcification. No aortic aneurysm. Moderate visceral arteriosclerosis. No pathologic adenopathy within the abdomen and pelvis. Reproductive: Uterus and bilateral adnexa are unremarkable. Other: No abdominal wall hernia. Musculoskeletal: L1 vertebral hemangioma. No suspicious lytic or blastic bone lesions. No acute bone abnormality. IMPRESSION: No acute intra-abdominal pathology identified. No definite radiographic explanation for the patient's reported symptoms. Possible mild bilateral nonobstructing nephrolithiasis. No urolithiasis. No hydronephrosis. Moderate bilateral renal cortical atrophy in keeping with chronic renal insufficiency. Pancolonic diverticulosis without superimposed acute inflammatory change. Aortic Atherosclerosis (ICD10-I70.0). Electronically Signed   By: Fidela Salisbury M.D.   On:  04/22/2021 03:43    Procedures Procedures    Medications Ordered in ED Medications  oxyCODONE-acetaminophen (PERCOCET/ROXICET) 5-325 MG per tablet 1 tablet (1 tablet Oral Given 04/23/21 0239)    ED Course/ Medical Decision Making/ A&P                           Medical Decision Making  This patient presents to the ED for concern of back pain, this involves an extensive number of treatment options, and is a complaint that carries with it a high risk of complications and morbidity.  The differential diagnosis includes nephrolithiasis, pyelonephritis, retroperitoneal hemorrhage, soft tissue injury, muscle spasm   Co morbidities that complicate the patient evaluation  Anemia, diverticular disease, ESRD, T2DM, HTN, malnutrition   Additional history obtained:  Additional history obtained from patient's husband External records from outside source obtained and reviewed including EMR   Lab Tests:  I Ordered, and personally interpreted labs.  The pertinent results include: Elevated creatinine and BUN consistent with ESRD, normal electrolytes, no leukocytosis, baseline anemia, baseline thrombocytopenia, equivocal urine but low suspicion for UTI and absence of any dysuria.   Imaging Studies ordered:  I ordered imaging studies including N/A I independently visualized and interpreted imaging which showed: CT scan from the night prior was reviewed.  No evidence of obstructive uropathy.  No other findings to explain the patient's recent back pain. I agree with the radiologist interpretation   Cardiac Monitoring:  The patient was maintained on a cardiac monitor.  I personally viewed and interpreted the cardiac monitored which showed an underlying rhythm of: Sinus rhythm  Problem List / ED Course:  71 year old female presenting for right-sided low back pain starting 2 days ago.  Patient presented to the ED waiting room for the past 2 nights in a row.  All night before last, she was able  to undergo work-up but left without being seen due to prolonged waiting time.  She returned last night and had repeat lab work done.  Upon being bedded in the ED, patient is asymptomatic.  Her vital signs are notable for hypertension.  There is no evidence of endorgan damage on lab work.  Patient is well-appearing on exam.  She has no areas of numbness or  weakness.  Patient was able to ambulate throughout the ED without any recurrence of pain.  I reviewed work-up from the previous 2 days, including CT scan from the night before last.  There is no evidence of kidney stone or any other findings to explain her pain.  Given the resolution of her pain and reassuring work-up, patient is suitable for discharge at this time.    Social Determinants of Health:  Multiple chronic illnesses           Final Clinical Impression(s) / ED Diagnoses Final diagnoses:  Acute right-sided low back pain without sciatica    Rx / DC Orders ED Discharge Orders     None         Godfrey Pick, MD 04/23/21 7867    Godfrey Pick, MD 04/23/21 [phone]

## 2021-04-23 NOTE — ED Triage Notes (Addendum)
Pt presents to the ED from home with complaints of right flank pain onset yesterday and thinks that she has a kidney stone. HD pt Tu/Th/Sat. Pt was seen last night and left before getting to a room and pt/husband want her results.  ?

## 2021-04-24 DIAGNOSIS — N189 Chronic kidney disease, unspecified: Secondary | ICD-10-CM | POA: Diagnosis not present

## 2021-04-24 DIAGNOSIS — R110 Nausea: Secondary | ICD-10-CM | POA: Diagnosis not present

## 2021-04-24 DIAGNOSIS — Z992 Dependence on renal dialysis: Secondary | ICD-10-CM | POA: Diagnosis not present

## 2021-04-24 DIAGNOSIS — D509 Iron deficiency anemia, unspecified: Secondary | ICD-10-CM | POA: Diagnosis not present

## 2021-04-24 DIAGNOSIS — D631 Anemia in chronic kidney disease: Secondary | ICD-10-CM | POA: Diagnosis not present

## 2021-04-24 DIAGNOSIS — N186 End stage renal disease: Secondary | ICD-10-CM | POA: Diagnosis not present

## 2021-04-24 DIAGNOSIS — N2581 Secondary hyperparathyroidism of renal origin: Secondary | ICD-10-CM | POA: Diagnosis not present

## 2021-04-25 DIAGNOSIS — R109 Unspecified abdominal pain: Secondary | ICD-10-CM | POA: Diagnosis not present

## 2021-04-25 DIAGNOSIS — N390 Urinary tract infection, site not specified: Secondary | ICD-10-CM | POA: Diagnosis not present

## 2021-04-26 ENCOUNTER — Other Ambulatory Visit: Payer: Self-pay

## 2021-04-26 ENCOUNTER — Encounter (HOSPITAL_COMMUNITY): Payer: Self-pay | Admitting: Emergency Medicine

## 2021-04-26 ENCOUNTER — Inpatient Hospital Stay (HOSPITAL_COMMUNITY)
Admission: EM | Admit: 2021-04-26 | Discharge: 2021-04-30 | DRG: 690 | Disposition: A | Discharge status: Home / Self Care | Payer: Medicare Other | Attending: Internal Medicine | Admitting: Internal Medicine

## 2021-04-26 DIAGNOSIS — D631 Anemia in chronic kidney disease: Secondary | ICD-10-CM | POA: Diagnosis not present

## 2021-04-26 DIAGNOSIS — I10 Essential (primary) hypertension: Secondary | ICD-10-CM | POA: Diagnosis present

## 2021-04-26 DIAGNOSIS — N186 End stage renal disease: Secondary | ICD-10-CM

## 2021-04-26 DIAGNOSIS — N2581 Secondary hyperparathyroidism of renal origin: Secondary | ICD-10-CM | POA: Diagnosis present

## 2021-04-26 DIAGNOSIS — Z23 Encounter for immunization: Secondary | ICD-10-CM

## 2021-04-26 DIAGNOSIS — Z20822 Contact with and (suspected) exposure to covid-19: Secondary | ICD-10-CM | POA: Diagnosis not present

## 2021-04-26 DIAGNOSIS — Z9049 Acquired absence of other specified parts of digestive tract: Secondary | ICD-10-CM

## 2021-04-26 DIAGNOSIS — N12 Tubulo-interstitial nephritis, not specified as acute or chronic: Secondary | ICD-10-CM | POA: Diagnosis not present

## 2021-04-26 DIAGNOSIS — R109 Unspecified abdominal pain: Secondary | ICD-10-CM | POA: Diagnosis not present

## 2021-04-26 DIAGNOSIS — I120 Hypertensive chronic kidney disease with stage 5 chronic kidney disease or end stage renal disease: Secondary | ICD-10-CM | POA: Diagnosis not present

## 2021-04-26 DIAGNOSIS — B9789 Other viral agents as the cause of diseases classified elsewhere: Secondary | ICD-10-CM | POA: Diagnosis present

## 2021-04-26 DIAGNOSIS — Z79899 Other long term (current) drug therapy: Secondary | ICD-10-CM

## 2021-04-26 DIAGNOSIS — I700 Atherosclerosis of aorta: Secondary | ICD-10-CM | POA: Diagnosis not present

## 2021-04-26 DIAGNOSIS — E1122 Type 2 diabetes mellitus with diabetic chronic kidney disease: Secondary | ICD-10-CM | POA: Diagnosis not present

## 2021-04-26 DIAGNOSIS — N189 Chronic kidney disease, unspecified: Secondary | ICD-10-CM | POA: Diagnosis not present

## 2021-04-26 DIAGNOSIS — Z992 Dependence on renal dialysis: Secondary | ICD-10-CM

## 2021-04-26 DIAGNOSIS — N10 Acute tubulo-interstitial nephritis: Secondary | ICD-10-CM | POA: Diagnosis not present

## 2021-04-26 DIAGNOSIS — R110 Nausea: Secondary | ICD-10-CM | POA: Diagnosis not present

## 2021-04-26 DIAGNOSIS — E559 Vitamin D deficiency, unspecified: Secondary | ICD-10-CM | POA: Diagnosis present

## 2021-04-26 DIAGNOSIS — D649 Anemia, unspecified: Secondary | ICD-10-CM | POA: Diagnosis present

## 2021-04-26 DIAGNOSIS — Z833 Family history of diabetes mellitus: Secondary | ICD-10-CM

## 2021-04-26 DIAGNOSIS — N185 Chronic kidney disease, stage 5: Secondary | ICD-10-CM

## 2021-04-26 DIAGNOSIS — E538 Deficiency of other specified B group vitamins: Secondary | ICD-10-CM | POA: Diagnosis present

## 2021-04-26 DIAGNOSIS — D509 Iron deficiency anemia, unspecified: Secondary | ICD-10-CM | POA: Diagnosis not present

## 2021-04-26 MED ORDER — ONDANSETRON HCL 4 MG/2ML IJ SOLN
4.0000 mg | Freq: Once | INTRAMUSCULAR | Status: AC
  Administered 2021-04-27: 4 mg via INTRAVENOUS
  Filled 2021-04-26: qty 2

## 2021-04-26 MED ORDER — LACTATED RINGERS IV BOLUS
1000.0000 mL | Freq: Once | INTRAVENOUS | Status: AC
Start: 2021-04-26 — End: 2021-04-27
  Administered 2021-04-27: 1000 mL via INTRAVENOUS

## 2021-04-26 MED ORDER — LIDOCAINE 5 % EX PTCH
1.0000 | MEDICATED_PATCH | CUTANEOUS | Status: DC
  Administered 2021-04-27 – 2021-04-29 (×2): 1 via TRANSDERMAL
  Filled 2021-04-26 (×2): qty 1

## 2021-04-26 MED ORDER — MORPHINE SULFATE (PF) 4 MG/ML IV SOLN
3.0000 mg | Freq: Once | INTRAVENOUS | Status: AC
  Administered 2021-04-27: 3 mg via INTRAVENOUS
  Filled 2021-04-26: qty 1

## 2021-04-26 MED ORDER — MORPHINE SULFATE (PF) 4 MG/ML IV SOLN: 4.0000 mg | Freq: Once | INTRAVENOUS | Status: DC

## 2021-04-26 NOTE — ED Provider Notes (Signed)
[facility] EMERGENCY DEPARTMENT Provider Note  CSN: [id number] Arrival date & time: 04/26/21 2243  Chief Complaint(s) Back Pain  HPI Bailey [name] is a 71 y.o. female with PMH T2DM, ESRD on hemodialysis Tuesday Thursday Saturday compliant with dialysis who presents emergency department for evaluation of right flank pain.  Patient has been seen twice in the emergency department this week for similar complaint but has left prior to being seen initially due to long ER wait times and pain was resolved on last ER evaluation.  Urinalysis from last ER evaluation does appear to possibly be infected but patient was not started on antibiotics until today where she was started on Cipro.  She has home Percocet for this pain but is breaking through this pain and presents to the emergency department for breakthrough pain.  Denies chest pain, shortness of breath, abdominal pain, nausea, vomiting or other systemic symptoms.  No numbness, tingling, weakness or other neurologic complaints   Back Pain  Past Medical History Past Medical History:  Diagnosis Date   Anemia    patient preference - stopped iron    ARF (acute renal failure) ([place]) 04/2016   dehydration   Chronic kidney disease    stage 5   Depression    Diabetes mellitus    Type II - pt preference - stopped lantus   Diabetic retinopathy ([place])    Diverticulitis    History of kidney stones    passed- 6   Hypertension    Osteomyelitis ([place])    Vitamin B 12 deficiency 06/09/2017   Vitamin D deficiency    Wears partial dentures    upper   Patient Active Problem List   Diagnosis Date Noted   Malnutrition of moderate degree 09/05/2019   Hypokalemia 09/02/2019   Fever of unknown origin 09/02/2019   ESRD on dialysis ([place]) 08/20/2019   Benign essential HTN 08/20/2019   Symptomatic anemia 08/20/2019   Thrombocytopenia ([employer]) 08/20/2019   CKD (chronic kidney disease) 05/23/2019   Type II diabetes mellitus, uncontrolled  12/07/2017   History of anemia due to chronic kidney disease 10/11/2017   Vitamin B 12 deficiency 06/09/2017   Great toe amputation status, right 09/04/2016   Osteomyelitis of toe of right foot ([place])    Acute renal failure (ARF) ([place]) 05/10/2016   SBO (small bowel obstruction) ([place]) 05/08/2016   Diverticular disease of jejunum s/p resection 05/09/2016 05/08/2016   Normocytic anemia 05/05/2016   Home Medication(s) Prior to Admission medications   Medication Sig Start Date End Date Taking? Authorizing Provider  acetaminophen (TYLENOL) 500 MG tablet Take 1,000 mg by mouth every 6 (six) hours as needed for fever or headache (pain).     [provider]  amlodipine-atorvastatin (CADUET) 10-10 MG tablet Take 1 tablet by mouth daily. 2.5 mg at night per husband    [provider]  carvedilol (COREG) 3.125 MG tablet Take 1 tablet (3.125 mg total) by mouth 2 (two) times daily with a meal. 09/08/19   Shelly Coss, MD  Cholecalciferol (VITAMIN D-3) 125 MCG (5000 UT) TABS Take 5,000 Units by mouth 2 (two) times daily.    [provider]  cyanocobalamin (,VITAMIN B-12,) 1000 MCG/ML injection Inject 1,000 mcg into the muscle every 30 (thirty) days.    [provider]  escitalopram (LEXAPRO) 5 MG tablet Take 5 mg by mouth daily as needed (anxiety).    [provider]  gentamicin cream (GARAMYCIN) 0.1 % Apply 1 application topically 2 (two) times daily. 03/25/20  Edrick Kins, DPM  NIFEdipine (ADALAT CC) 30 MG 24 hr tablet Take 30 mg by mouth daily. 08/11/19   [provider]  sodium bicarbonate 650 MG tablet Take 650 mg by mouth 2 (two) times daily.    [provider]  Vitamin D, Ergocalciferol, (DRISDOL) 1.25 MG (50000 UNIT) CAPS capsule Take 50,000 Units by mouth every Sunday.     [provider]                                                                                                                                    Past  Surgical History Past Surgical History:  Procedure Laterality Date   AMPUTATION Right 08/14/2016   Procedure: RIGHT 1ST RAY AMPUTATION MID-SHAFT;  Surgeon: Newt Minion, MD;  Location: [place] [facility];  Service: Orthopedics;  Laterality: Right;   BASCILIC VEIN TRANSPOSITION Left 06/08/2019   Procedure: LEFT BRACHIOCEPHALIC VEIN CREATION;  Surgeon: Marty Heck, MD;  Location: [place];  Service: Vascular;  Laterality: Left;   INCISION AND DRAINAGE ABSCESS Left 02/11/2014   Procedure: INCISION AND DRAINAGE ABSCESS Left Buttock;  Surgeon: Jackolyn Confer, MD;  Location: [facility];  Service: General;  Laterality: Left;   INSERTION OF DIALYSIS CATHETER Left 09/07/2019   Procedure: INSERTION OF LEFT INTERNAL JUGULAR DIALYSIS CATHETER;  Surgeon: Rosetta Posner, MD;  Location: [facility];  Service: Vascular;  Laterality: Left;   IR REMOVAL TUN CV CATH W/O FL  09/04/2019   LAPAROSCOPIC SMALL BOWEL RESECTION N/A 05/09/2016   Procedure: LAPAROSCOPIC SMALL BOWEL RESECTION;  Surgeon: Michael Boston, MD;  Location: [facility];  Service: General;  Laterality: N/A;   LAPAROSCOPY N/A 05/09/2016   Procedure: LAPAROSCOPY DIAGNOSTIC, LYSIS OF ADHESIONS, SMALL BOWEL RESECTION X 2;  Surgeon: Michael Boston, MD;  Location: [facility];  Service: General;  Laterality: N/A;   TOE AMPUTATION     left foot great toe   Family History Family History  Problem Relation Age of Onset   Diabetes Mother    Asthma Sister     Social History Social History   Tobacco Use   Smoking status: Never   Smokeless tobacco: Never  Vaping Use   Vaping Use: Never used  Substance Use Topics   Alcohol use: No    Alcohol/week: 0.0 standard drinks   Drug use: No   Allergies Patient has no known allergies.  Review of Systems Review of Systems  Genitourinary:  Positive for flank pain.  Musculoskeletal:  Positive for back pain.   Physical Exam Vital Signs  I have reviewed the triage vital signs BP (!) 195/70    Pulse 69    Temp 98.2 F (36.8 C) (Oral)     Resp 18    Ht $R'5\' 4"'tR$  (1.626 m)    Wt 52.2 kg    LMP  (LMP Unknown)    SpO2 100%    BMI 19.75 kg/m   Physical Exam Vitals  and nursing note reviewed.  Constitutional:      General: She is not in acute distress.    Appearance: She is well-developed.  HENT:     Head: Normocephalic and atraumatic.  Eyes:     Conjunctiva/sclera: Conjunctivae normal.  Cardiovascular:     Rate and Rhythm: Normal rate and regular rhythm.     Heart sounds: No murmur heard. Pulmonary:     Effort: Pulmonary effort is normal. No respiratory distress.     Breath sounds: Normal breath sounds.  Abdominal:     Palpations: Abdomen is soft.     Tenderness: There is no abdominal tenderness. There is right CVA tenderness.  Musculoskeletal:        General: No swelling.     Cervical back: Neck supple.  Skin:    General: Skin is warm and dry.     Capillary Refill: Capillary refill takes less than 2 seconds.  Neurological:     Mental Status: She is alert.  Psychiatric:        Mood and Affect: Mood normal.    ED Results and Treatments Labs (all labs ordered are listed, but only abnormal results are displayed) Labs Reviewed  URINALYSIS, ROUTINE W REFLEX MICROSCOPIC  COMPREHENSIVE METABOLIC PANEL  CBC WITH DIFFERENTIAL/PLATELET                                                                                                                          Radiology No results found.  Pertinent labs & imaging results that were available during my care of the patient were reviewed by me and considered in my medical decision making (see MDM for details).  Medications Ordered in ED Medications  lactated ringers bolus 1,000 mL (has no administration in time range)  morphine (PF) 4 MG/ML injection 4 mg (has no administration in time range)  ondansetron (ZOFRAN) injection 4 mg (has no administration in time range)                                                                                                                                      Procedures Procedures  (including critical care time)  Medical Decision Making / ED Course   This patient presents to the ED for concern of flank pain, this involves an extensive number of treatment options, and is a complaint that carries with it a high risk of complications and morbidity.  The differential diagnosis includes pyelonephritis, AAA, aortic dissection, nephrolithiasis  MDM: Patient seen emergency department for evaluation of flank pain.  Physical exam reveals significant tenderness of the right CVA but is otherwise unremarkable.  Laboratory evaluation with no significant leukocytosis, hemoglobin 10.3, creatinine 3.26 which is consistent with her ESRD.  Chloride 91.  Urinalysis concerning for infection with large leuk esterase, greater than 50 white blood cells.  As the patient is currently on her third ER visit for the same complaint, I am concerned that she will fail outpatient therapy and her bounce back rate is high.  She is currently on Cipro which is contraindicated in patients over 65 secondary to poor side effect profile and thus we will transition the patient to ceftriaxone and admit the patient for observation and pain control.  Patient then admitted.   Additional history obtained: -Additional history obtained from husband -External records from outside source obtained and reviewed including: Chart review including previous notes, labs, imaging, consultation notes   Lab Tests: -I ordered, reviewed, and interpreted labs.   The pertinent results include:   Labs Reviewed  URINALYSIS, ROUTINE W REFLEX MICROSCOPIC  COMPREHENSIVE METABOLIC PANEL  CBC WITH DIFFERENTIAL/PLATELET       Imaging Studies ordered: I ordered imaging studies including CTAP I independently visualized and interpreted imaging. I agree with the radiologist interpretation   Medicines ordered and prescription drug management: Meds ordered this encounter  Medications    lactated ringers bolus 1,000 mL   morphine (PF) 4 MG/ML injection 4 mg   ondansetron (ZOFRAN) injection 4 mg    -I have reviewed the patients home medicines and have made adjustments as needed  Critical interventions none   Cardiac Monitoring: The patient was maintained on a cardiac monitor.  I personally viewed and interpreted the cardiac monitored which showed an underlying rhythm of: NSR  Social Determinants of Health:  Factors impacting patients care include: none   Reevaluation: After the interventions noted above, I reevaluated the patient and found that they have :improved  Co morbidities that complicate the patient evaluation  Past Medical History:  Diagnosis Date   Anemia    patient preference - stopped iron    ARF (acute renal failure) ([place]) 04/2016   dehydration   Chronic kidney disease    stage 5   Depression    Diabetes mellitus    Type II - pt preference - stopped lantus   Diabetic retinopathy ([place])    Diverticulitis    History of kidney stones    passed- 6   Hypertension    Osteomyelitis ([place])    Vitamin B 12 deficiency 06/09/2017   Vitamin D deficiency    Wears partial dentures    upper      Dispostion: I considered admission for this patient, and due to high concern for bounce back with pyelonephritis she will be admitted.     Final Clinical Impression(s) / ED Diagnoses Final diagnoses:  None     '@PCDICTATION'$ @    Teressa Lower, MD 04/27/21 [phone]

## 2021-04-26 NOTE — ED Provider Triage Note (Signed)
Emergency Medicine Provider Triage Evaluation Note ? ?Bailey [name] , a 71 y.o. female  was evaluated in triage.  Pt complains of low back pain.  Initially presented to the emergency department 4 days ago with the same complaints and left without being seen due to the wait time.  She represented 3 days ago and had a generally reassuring work-up and was discharged with a complete resolution of her symptoms.  She states that her low back pain began once again.  She has been taking Percocet without relief.  Denies any radiation of pain.  States that she is on hemodialysis and was last dialyzed this morning.  She still produces urine.  Denies any urinary complaints.  Denies any constipation or incontinence.  Denies any numbness, tingling, or acute weakness in the lower extremities.  Patient also notes that she was started on ciprofloxacin for a possible UTI. ? ?Physical Exam  ?BP (!) 195/70   Pulse 69   Temp 98.2 ?F (36.8 ?C) (Oral)   Resp 18   LMP  (LMP Unknown)   SpO2 100%  ?Gen:   Awake, no distress   ?Resp:  Normal effort  ?MSK:   Moves extremities without difficulty  ?Other:  Mild to moderate TTP noted to the ?Lumbar spine as well as the right lumbar paraspinal musculature. ? ?Medical Decision Making  ?Medically screening exam initiated at 11:09 PM.  Appropriate orders placed.  Bailey [name] was informed that the remainder of the evaluation will be completed by another provider, this initial triage assessment does not replace that evaluation, and the importance of remaining in the ED until their evaluation is complete. ?  ?Rayna Sexton, PA-C ?04/26/21 2311 ? ?

## 2021-04-26 NOTE — ED Triage Notes (Signed)
Pt reports right lower back pain.  She has had three percocet but have not worked.  Pain medication is not working.  Pt is a dialysis pt, Tuesday, Thursday and Saturday.  No urinary symptoms reported. ?

## 2021-04-27 ENCOUNTER — Emergency Department (HOSPITAL_COMMUNITY): Payer: Medicare Other

## 2021-04-27 ENCOUNTER — Encounter (HOSPITAL_COMMUNITY): Payer: Self-pay | Admitting: Family Medicine

## 2021-04-27 DIAGNOSIS — I120 Hypertensive chronic kidney disease with stage 5 chronic kidney disease or end stage renal disease: Secondary | ICD-10-CM | POA: Diagnosis present

## 2021-04-27 DIAGNOSIS — E1129 Type 2 diabetes mellitus with other diabetic kidney complication: Secondary | ICD-10-CM | POA: Diagnosis not present

## 2021-04-27 DIAGNOSIS — Z992 Dependence on renal dialysis: Secondary | ICD-10-CM | POA: Diagnosis not present

## 2021-04-27 DIAGNOSIS — I700 Atherosclerosis of aorta: Secondary | ICD-10-CM | POA: Diagnosis not present

## 2021-04-27 DIAGNOSIS — N12 Tubulo-interstitial nephritis, not specified as acute or chronic: Secondary | ICD-10-CM

## 2021-04-27 DIAGNOSIS — N186 End stage renal disease: Secondary | ICD-10-CM | POA: Diagnosis present

## 2021-04-27 DIAGNOSIS — D649 Anemia, unspecified: Secondary | ICD-10-CM | POA: Diagnosis not present

## 2021-04-27 DIAGNOSIS — I10 Essential (primary) hypertension: Secondary | ICD-10-CM

## 2021-04-27 DIAGNOSIS — E538 Deficiency of other specified B group vitamins: Secondary | ICD-10-CM | POA: Diagnosis present

## 2021-04-27 DIAGNOSIS — N2581 Secondary hyperparathyroidism of renal origin: Secondary | ICD-10-CM | POA: Diagnosis present

## 2021-04-27 DIAGNOSIS — Z833 Family history of diabetes mellitus: Secondary | ICD-10-CM | POA: Diagnosis not present

## 2021-04-27 DIAGNOSIS — Z79899 Other long term (current) drug therapy: Secondary | ICD-10-CM | POA: Diagnosis not present

## 2021-04-27 DIAGNOSIS — B9789 Other viral agents as the cause of diseases classified elsewhere: Secondary | ICD-10-CM | POA: Diagnosis present

## 2021-04-27 DIAGNOSIS — M5127 Other intervertebral disc displacement, lumbosacral region: Secondary | ICD-10-CM | POA: Diagnosis not present

## 2021-04-27 DIAGNOSIS — D631 Anemia in chronic kidney disease: Secondary | ICD-10-CM | POA: Diagnosis present

## 2021-04-27 DIAGNOSIS — E559 Vitamin D deficiency, unspecified: Secondary | ICD-10-CM | POA: Diagnosis present

## 2021-04-27 DIAGNOSIS — E1122 Type 2 diabetes mellitus with diabetic chronic kidney disease: Secondary | ICD-10-CM | POA: Diagnosis present

## 2021-04-27 DIAGNOSIS — M5126 Other intervertebral disc displacement, lumbar region: Secondary | ICD-10-CM | POA: Diagnosis not present

## 2021-04-27 DIAGNOSIS — Z20822 Contact with and (suspected) exposure to covid-19: Secondary | ICD-10-CM | POA: Diagnosis present

## 2021-04-27 DIAGNOSIS — M4317 Spondylolisthesis, lumbosacral region: Secondary | ICD-10-CM | POA: Diagnosis not present

## 2021-04-27 DIAGNOSIS — D1809 Hemangioma of other sites: Secondary | ICD-10-CM | POA: Diagnosis not present

## 2021-04-27 DIAGNOSIS — Z23 Encounter for immunization: Secondary | ICD-10-CM | POA: Diagnosis not present

## 2021-04-27 DIAGNOSIS — Z9049 Acquired absence of other specified parts of digestive tract: Secondary | ICD-10-CM | POA: Diagnosis not present

## 2021-04-27 DIAGNOSIS — N10 Acute tubulo-interstitial nephritis: Secondary | ICD-10-CM | POA: Diagnosis present

## 2021-04-27 DIAGNOSIS — R109 Unspecified abdominal pain: Secondary | ICD-10-CM | POA: Diagnosis not present

## 2021-04-27 LAB — CBC WITH DIFFERENTIAL/PLATELET
Abs Immature Granulocytes: 0.01 10*3/uL (ref 0.00–0.07)
Basophils Absolute: 0 10*3/uL (ref 0.0–0.1)
Basophils Relative: 1 %
Eosinophils Absolute: 0.1 10*3/uL (ref 0.0–0.5)
Eosinophils Relative: 2 %
HCT: 31.4 % — ABNORMAL LOW (ref 36.0–46.0)
Hemoglobin: 10.3 g/dL — ABNORMAL LOW (ref 12.0–15.0)
Immature Granulocytes: 0 %
Lymphocytes Relative: 30 %
Lymphs Abs: 1.3 10*3/uL (ref 0.7–4.0)
MCH: 32 pg (ref 26.0–34.0)
MCHC: 32.8 g/dL (ref 30.0–36.0)
MCV: 97.5 fL (ref 80.0–100.0)
Monocytes Absolute: 0.5 10*3/uL (ref 0.1–1.0)
Monocytes Relative: 12 %
Neutro Abs: 2.4 10*3/uL (ref 1.7–7.7)
Neutrophils Relative %: 55 %
Platelets: 115 10*3/uL — ABNORMAL LOW (ref 150–400)
RBC: 3.22 MIL/uL — ABNORMAL LOW (ref 3.87–5.11)
RDW: 13.7 % (ref 11.5–15.5)
WBC: 4.4 10*3/uL (ref 4.0–10.5)
nRBC: 0 % (ref 0.0–0.2)

## 2021-04-27 LAB — RESPIRATORY PANEL BY PCR

## 2021-04-27 LAB — URINALYSIS, ROUTINE W REFLEX MICROSCOPIC
Bilirubin Urine: NEGATIVE
Glucose, UA: NEGATIVE mg/dL
Hgb urine dipstick: NEGATIVE
Ketones, ur: NEGATIVE mg/dL
Nitrite: NEGATIVE
Protein, ur: 100 mg/dL — AB
Specific Gravity, Urine: 1.009 (ref 1.005–1.030)
WBC, UA: >50 WBC/hpf — ABNORMAL HIGH (ref 0–5)
pH: 9 — ABNORMAL HIGH (ref 5.0–8.0)

## 2021-04-27 LAB — COMPREHENSIVE METABOLIC PANEL
ALT: 9 U/L (ref 0–44)
AST: 17 U/L (ref 15–41)
Albumin: 3.2 g/dL — ABNORMAL LOW (ref 3.5–5.0)
Alkaline Phosphatase: 49 U/L (ref 38–126)
Anion gap: 12 (ref 5–15)
BUN: 16 mg/dL (ref 8–23)
CO2: 33 mmol/L — ABNORMAL HIGH (ref 22–32)
Calcium: 8.6 mg/dL — ABNORMAL LOW (ref 8.9–10.3)
Chloride: 91 mmol/L — ABNORMAL LOW (ref 98–111)
Creatinine, Ser: 3.26 mg/dL — ABNORMAL HIGH (ref 0.44–1.00)
GFR, Estimated: 14 mL/min — ABNORMAL LOW (ref >60)
Glucose, Bld: 125 mg/dL — ABNORMAL HIGH (ref 70–99)
Potassium: 3.6 mmol/L (ref 3.5–5.1)
Sodium: 136 mmol/L (ref 135–145)
Total Bilirubin: 0.8 mg/dL (ref 0.3–1.2)
Total Protein: 6.1 g/dL — ABNORMAL LOW (ref 6.5–8.1)

## 2021-04-27 LAB — LACTIC ACID, PLASMA
Lactic Acid, Venous: 1.2 mmol/L (ref 0.5–1.9)
Lactic Acid, Venous: 1.4 mmol/L (ref 0.5–1.9)

## 2021-04-27 MED ORDER — OXYCODONE-ACETAMINOPHEN 5-325 MG PO TABS
0.5000 | ORAL_TABLET | Freq: Four times a day (QID) | ORAL | Status: DC | PRN
  Administered 2021-04-27 – 2021-04-29 (×3): 0.5 via ORAL
  Filled 2021-04-27 (×3): qty 1

## 2021-04-27 MED ORDER — FERRIC CITRATE 1 GM 210 MG(FE) PO TABS
210.0000 mg | ORAL_TABLET | Freq: Three times a day (TID) | ORAL | Status: DC
  Administered 2021-04-27 – 2021-04-30 (×10): 210 mg via ORAL
  Filled 2021-04-27 (×12): qty 1

## 2021-04-27 MED ORDER — ENOXAPARIN SODIUM 30 MG/0.3ML IJ SOSY
30.0000 mg | PREFILLED_SYRINGE | INTRAMUSCULAR | Status: DC
  Administered 2021-04-27 – 2021-04-29 (×3): 30 mg via SUBCUTANEOUS
  Filled 2021-04-27 (×4): qty 0.3

## 2021-04-27 MED ORDER — DIPHENHYDRAMINE HCL 50 MG/ML IJ SOLN
12.5000 mg | Freq: Once | INTRAMUSCULAR | Status: AC
  Administered 2021-04-27: 12.5 mg via INTRAVENOUS
  Filled 2021-04-27: qty 1

## 2021-04-27 MED ORDER — SODIUM CHLORIDE 0.9 % IV SOLN
1.0000 g | Freq: Once | INTRAVENOUS | Status: AC
  Administered 2021-04-27: 1 g via INTRAVENOUS
  Filled 2021-04-27: qty 10

## 2021-04-27 MED ORDER — MORPHINE SULFATE (PF) 2 MG/ML IV SOLN
2.0000 mg | INTRAVENOUS | Status: DC | PRN
  Administered 2021-04-28 – 2021-04-29 (×2): 2 mg via INTRAVENOUS
  Filled 2021-04-27 (×2): qty 1

## 2021-04-27 MED ORDER — LABETALOL HCL 5 MG/ML IV SOLN
20.0000 mg | INTRAVENOUS | Status: DC | PRN
  Administered 2021-04-27 – 2021-04-30 (×6): 20 mg via INTRAVENOUS
  Filled 2021-04-27 (×6): qty 4

## 2021-04-27 MED ORDER — ACETAMINOPHEN 325 MG PO TABS: 650.0000 mg | ORAL_TABLET | Freq: Four times a day (QID) | ORAL | Status: DC | PRN

## 2021-04-27 MED ORDER — TRAZODONE HCL 50 MG PO TABS
25.0000 mg | ORAL_TABLET | Freq: Every evening | ORAL | Status: DC | PRN
  Administered 2021-04-27 – 2021-04-29 (×2): 25 mg via ORAL
  Filled 2021-04-27 (×2): qty 1

## 2021-04-27 MED ORDER — CHLORHEXIDINE GLUCONATE CLOTH 2 % EX PADS
6.0000 | MEDICATED_PAD | Freq: Every day | CUTANEOUS | Status: DC
  Administered 2021-04-27 – 2021-04-30 (×4): 6 via TOPICAL

## 2021-04-27 MED ORDER — ONDANSETRON HCL 4 MG/2ML IJ SOLN: 4.0000 mg | Freq: Four times a day (QID) | INTRAMUSCULAR | Status: DC | PRN

## 2021-04-27 MED ORDER — SODIUM CHLORIDE 0.9 % IV SOLN
1.0000 g | INTRAVENOUS | Status: DC
  Administered 2021-04-28 – 2021-04-30 (×3): 1 g via INTRAVENOUS
  Filled 2021-04-27 (×3): qty 10

## 2021-04-27 MED ORDER — INFLUENZA VAC A&B SA ADJ QUAD 0.5 ML IM PRSY
0.5000 mL | PREFILLED_SYRINGE | INTRAMUSCULAR | Status: DC
  Filled 2021-04-27: qty 0.5

## 2021-04-27 MED ORDER — CARVEDILOL 3.125 MG PO TABS
3.1250 mg | ORAL_TABLET | Freq: Two times a day (BID) | ORAL | Status: DC
  Administered 2021-04-27 – 2021-04-30 (×7): 3.125 mg via ORAL
  Filled 2021-04-27 (×7): qty 1

## 2021-04-27 MED ORDER — IOHEXOL 300 MG/ML  SOLN
100.0000 mL | Freq: Once | INTRAMUSCULAR | Status: AC | PRN
  Administered 2021-04-27: 100 mL via INTRAVENOUS

## 2021-04-27 MED ORDER — ONDANSETRON HCL 4 MG PO TABS: 4.0000 mg | ORAL_TABLET | Freq: Four times a day (QID) | ORAL | Status: DC | PRN

## 2021-04-27 MED ORDER — SODIUM CHLORIDE 0.9 % IV SOLN: INTRAVENOUS | Status: DC

## 2021-04-27 MED ORDER — AMLODIPINE BESYLATE 5 MG PO TABS
2.5000 mg | ORAL_TABLET | Freq: Every day | ORAL | Status: DC
Start: 2021-04-27 — End: 2021-04-28
  Administered 2021-04-27: 2.5 mg via ORAL
  Filled 2021-04-27: qty 1

## 2021-04-27 MED ORDER — ACETAMINOPHEN 650 MG RE SUPP: 650.0000 mg | Freq: Four times a day (QID) | RECTAL | Status: DC | PRN

## 2021-04-27 MED ORDER — MAGNESIUM HYDROXIDE 400 MG/5ML PO SUSP: 30.0000 mL | Freq: Every day | ORAL | Status: DC | PRN

## 2021-04-27 NOTE — Progress Notes (Signed)
PROGRESS NOTE        PATIENT DETAILS Name: Bailey [name] Age: 71 y.o. Sex: female Date of Birth: 03-22-1950 Admit Date: 04/26/2021 Admitting Physician Christel Mormon, MD CMK:[id number], Mechele Claude, MD  Brief Summary: Patient is a 71 y.o.  female with history of ESRD on HD TTS-presenting to the ED (second visit) with right flank pain.  She was thought to have pyelonephritis and subsequently admitted to the hospitalist service.  Significant Hospital events: 2/28>> ED visit-right flank pain-sent home on oral ciprofloxacin-thought to have pyelonephritis 3/04>> presented back to the ED with severe right flank pain-admit to [facility] service.  Significant imaging studies: 2/28>> CT renal stone study: No acute intra-abdominal pathology. 3/05>> CT abdomen/pelvis: Multiple bilateral subcentimeter nonobstructing renal calculi, colonic diverticulosis  Significant microbiology data: 3/5>> respiratory virus panel: Positive for rhinovirus  Procedures: None  Consults:  Nephrology  Subjective: Lying comfortably in bed-denies any chest pain or shortness of breath.  Continues to have pain in the right flank area-appears comfortable.  Objective: Vitals: Blood pressure (!) 172/58, pulse 79, temperature 98.3 F (36.8 C), temperature source Oral, resp. rate 17, height $RemoveBe'5\' 4"'CHKuAZnYH$  (1.626 m), weight 52.2 kg, SpO2 100 %.   Exam: Gen Exam:Alert awake-not in any distress HEENT:atraumatic, normocephalic Chest: B/L clear to auscultation anteriorly CVS:S1S2 regular Abdomen:soft non tender, non distended.  Right CVA angle significantly tender. Extremities:no edema Neurology: Non focal Skin: no rash  Pertinent Labs/Radiology: CBC Latest Ref Rng & Units 04/26/2021 04/23/2021 04/22/2021  WBC 4.0 - 10.5 K/uL 4.4 6.0 5.2  Hemoglobin 12.0 - 15.0 g/dL 10.3(L) 10.4(L) 10.4(L)  Hematocrit 36.0 - 46.0 % 31.4(L) 32.0(L) 31.7(L)  Platelets 150 - 400 K/uL 115(L) 124(L) 123(L)    Lab Results  Component  Value Date   NA 136 04/26/2021   K 3.6 04/26/2021   CL 91 (L) 04/26/2021   CO2 33 (H) 04/26/2021      Assessment/Plan: Right-sided pyelonephritis: Continues to have right flank pain-UA suggestive of infection-although she is a dialysis patient-she still makes urine.  She was recently seen in the emergency room on 2/28 and sent home on ciprofloxacin-unfortunately she continued to have pain and presented back to the hospital.  For now continue with IV Rocephin-I have asked the ED RN to see if we can send urine culture-although I suspect it may be sterile as patient was already on oral antimicrobial therapy.  Reasonable to continue with IV Rocephin for a few days as patient failed outpatient oral antimicrobial therapy.  ESRD on HD TTS: Nephrology consulted for HD needs.  Normocytic anemia: Likely secondary to CKD-defer Aranesp/IV iron to nephrology service  HTN: BP stable-continue Norvasc/Coreg  History of DM-2: Being managed with diet/lifestyle modification-no longer on medications.  BMI: Estimated body mass index is 19.75 kg/m as calculated from the following:   Height as of this encounter: $RemoveBeforeD'5\' 4"'LswsKntdmUvhXJ$  (1.626 m).   Weight as of this encounter: 52.2 kg.   Code status:   Code Status: Full Code   DVT Prophylaxis: enoxaparin (LOVENOX) injection 30 mg Start: 04/27/21 1800    Family Communication: None at bedside   Disposition Plan: Status is: Observation The patient will require care spanning > 2 midnights and should be moved to inpatient because: Right-sided pyelonephritis-needs IV antibiotics as patient failed outpatient antimicrobial therapy.   Planned Discharge Destination:Home in the next 2-3 days.   Diet: Diet Order  Diet Heart Room service appropriate? Yes; Fluid consistency: Thin  Diet effective now                     Antimicrobial agents: Anti-infectives (From admission, onward)    Start     Dose/Rate Route Frequency Ordered Stop   04/28/21 0600   cefTRIAXone (ROCEPHIN) 1 g in sodium chloride 0.9 % 100 mL IVPB        1 g 200 mL/hr over 30 Minutes Intravenous Every 24 hours 04/27/21 0552     04/27/21 0445  cefTRIAXone (ROCEPHIN) 1 g in sodium chloride 0.9 % 100 mL IVPB        1 g 200 mL/hr over 30 Minutes Intravenous  Once 04/27/21 0433 04/27/21 0653        MEDICATIONS: Scheduled Meds:  amLODipine  2.5 mg Oral QHS   carvedilol  3.125 mg Oral BID WC   enoxaparin (LOVENOX) injection  30 mg Subcutaneous Q24H   ferric citrate  210 mg Oral TID   lidocaine  1 patch Transdermal Q24H   Continuous Infusions:  [START ON 04/28/2021] cefTRIAXone (ROCEPHIN)  IV     PRN Meds:.acetaminophen **OR** acetaminophen, labetalol, magnesium hydroxide, morphine injection, ondansetron **OR** ondansetron (ZOFRAN) IV, oxyCODONE-acetaminophen, traZODone   I have personally reviewed following labs and imaging studies  LABORATORY DATA: CBC: Recent Labs  Lab 04/22/21 0234 04/23/21 0221 04/26/21 2356  WBC 5.2 6.0 4.4  NEUTROABS 2.3 3.2 2.4  HGB 10.4* 10.4* 10.3*  HCT 31.7* 32.0* 31.4*  MCV 97.2 97.3 97.5  PLT 123* 124* 115*    Basic Metabolic Panel: Recent Labs  Lab 04/22/21 0234 04/23/21 0221 04/26/21 2356  NA 140 137 136  K 3.8 4.0 3.6  CL 95* 96* 91*  CO2 33* 30 33*  GLUCOSE 105* 118* 125*  BUN 41* 49* 16  CREATININE 5.33* 6.31* 3.26*  CALCIUM 9.5 9.0 8.6*    GFR: Estimated Creatinine Clearance: 12.9 mL/min (A) (by C-G formula based on SCr of 3.26 mg/dL (H)).  Liver Function Tests: Recent Labs  Lab 04/26/21 2356  AST 17  ALT 9  ALKPHOS 49  BILITOT 0.8  PROT 6.1*  ALBUMIN 3.2*   No results for input(s): LIPASE, AMYLASE in the last 168 hours. No results for input(s): AMMONIA in the last 168 hours.  Coagulation Profile: No results for input(s): INR, PROTIME in the last 168 hours.  Cardiac Enzymes: No results for input(s): CKTOTAL, CKMB, CKMBINDEX, TROPONINI in the last 168 hours.  BNP (last 3 results) No results  for input(s): PROBNP in the last 8760 hours.  Lipid Profile: No results for input(s): CHOL, HDL, LDLCALC, TRIG, CHOLHDL, LDLDIRECT in the last 72 hours.  Thyroid Function Tests: No results for input(s): TSH, T4TOTAL, FREET4, T3FREE, THYROIDAB in the last 72 hours.  Anemia Panel: No results for input(s): VITAMINB12, FOLATE, FERRITIN, TIBC, IRON, RETICCTPCT in the last 72 hours.  Urine analysis:    Component Value Date/Time   COLORURINE YELLOW 04/27/2021 0341   APPEARANCEUR HAZY (A) 04/27/2021 0341   LABSPEC 1.009 04/27/2021 0341   PHURINE 9.0 (H) 04/27/2021 0341   GLUCOSEU NEGATIVE 04/27/2021 0341   HGBUR NEGATIVE 04/27/2021 0341   BILIRUBINUR NEGATIVE 04/27/2021 0341   BILIRUBINUR small 02/10/2014 1127   KETONESUR NEGATIVE 04/27/2021 0341   PROTEINUR 100 (A) 04/27/2021 0341   UROBILINOGEN 1.0 02/10/2014 2233   NITRITE NEGATIVE 04/27/2021 0341   LEUKOCYTESUR LARGE (A) 04/27/2021 0341    Sepsis Labs: Lactic Acid, Venous    Component  Value Date/Time   LATICACIDVEN 1.2 04/27/2021 0711    MICROBIOLOGY: Recent Results (from the past 240 hour(s))  Respiratory (~20 pathogens) panel by PCR     Status: Abnormal   Collection Time: 04/27/21  8:07 AM   Specimen: Nasopharyngeal Swab; Respiratory  Result Value Ref Range Status   Adenovirus NOT DETECTED NOT DETECTED Final   Coronavirus 229E NOT DETECTED NOT DETECTED Final    Comment: (NOTE) The Coronavirus on the Respiratory Panel, DOES NOT test for the novel  Coronavirus (2019 nCoV)    Coronavirus HKU1 NOT DETECTED NOT DETECTED Final   Coronavirus NL63 NOT DETECTED NOT DETECTED Final   Coronavirus OC43 NOT DETECTED NOT DETECTED Final   Metapneumovirus NOT DETECTED NOT DETECTED Final   Rhinovirus / Enterovirus DETECTED (A) NOT DETECTED Final   Influenza A NOT DETECTED NOT DETECTED Final   Influenza B NOT DETECTED NOT DETECTED Final   Parainfluenza Virus 1 NOT DETECTED NOT DETECTED Final   Parainfluenza Virus 2 NOT DETECTED NOT  DETECTED Final   Parainfluenza Virus 3 NOT DETECTED NOT DETECTED Final   Parainfluenza Virus 4 NOT DETECTED NOT DETECTED Final   Respiratory Syncytial Virus NOT DETECTED NOT DETECTED Final   Bordetella pertussis NOT DETECTED NOT DETECTED Final   Bordetella Parapertussis NOT DETECTED NOT DETECTED Final   Chlamydophila pneumoniae NOT DETECTED NOT DETECTED Final   Mycoplasma pneumoniae NOT DETECTED NOT DETECTED Final    Comment: Performed at [facility], [address] [place], [place] [address]    RADIOLOGY STUDIES/RESULTS: CT ABDOMEN PELVIS W CONTRAST  Result Date: 04/27/2021 CLINICAL DATA:  Right flank pain. EXAM: CT ABDOMEN AND PELVIS WITH CONTRAST TECHNIQUE: Multidetector CT imaging of the abdomen and pelvis was performed using the standard protocol following bolus administration of intravenous contrast. RADIATION DOSE REDUCTION: This exam was performed according to the departmental dose-optimization program which includes automated exposure control, adjustment of the mA and/or kV according to patient size and/or use of iterative reconstruction technique. CONTRAST:  12mL OMNIPAQUE IOHEXOL 300 MG/ML  SOLN COMPARISON:  April 22, 2021 FINDINGS: Lower chest: Mild atelectatic changes are seen within the bilateral lung bases. Hepatobiliary: No focal liver abnormality is seen. The gallbladder is moderately distended without evidence of gallstones, gallbladder wall thickening, or biliary dilatation. Pancreas: Unremarkable. No pancreatic ductal dilatation or surrounding inflammatory changes. Spleen: Normal in size without focal abnormality. Adrenals/Urinary Tract: Adrenal glands are unremarkable. The kidneys are atrophic in appearance with diffuse renal cortical thinning. Multiple bilateral 2 mm nonobstructing renal calculi are seen. Prominent bilateral extrarenal pelvis are also noted, without obstructing renal calculi. Bladder is unremarkable. Stomach/Bowel: Stomach is within normal limits.  Appendix appears normal. Surgically anastomosed bowel is seen within the medial aspect of the left lower quadrant and anterior medial aspect of the mid to lower right abdomen. No evidence of bowel dilatation. Stool is seen throughout the large bowel. This is slightly more prominent within the mid and distal portions of the sigmoid colon noninflamed diverticula are seen throughout the large bowel. Vascular/Lymphatic: Aortic atherosclerosis. No enlarged abdominal or pelvic lymph nodes. Reproductive: Tiny tortuous enhancing vessels are seen along the periphery of an otherwise normal appearing uterus. The bilateral adnexa are unremarkable. Other: No abdominal wall hernia or abnormality. No abdominopelvic ascites. Musculoskeletal: A stable hemangioma is seen within the L1 vertebral body. Degenerative changes are noted throughout the lumbar spine. IMPRESSION: 1. Multiple bilateral subcentimeter nonobstructing renal calculi. 2. Colonic diverticulosis. 3. Postoperative changes consistent with history of prior partial small bowel resection. Aortic  Atherosclerosis (ICD10-I70.0). Electronically Signed   By: Virgina Norfolk M.D.   On: 04/27/2021 02:37     LOS: 0 days   Oren Binet, MD  [facility]    To contact the attending provider between 7A-7P or the covering provider during after hours 7P-7A, please log into the web site [email or web address] and access using universal [place] password for that web site. If you do not have the password, please call the hospital operator.  04/27/2021, 10:06 AM

## 2021-04-27 NOTE — Assessment & Plan Note (Addendum)
-   The patient will be admitted to a medical bed. ?- Pain management will be provided. ?- The patient has not responded to outpatient p.o. Cipro. ?- The patient will be continued on IV Rocephin. ?- We will follow urine culture and sensitivity. ?- We will check lactic acid level. ? ?

## 2021-04-27 NOTE — Assessment & Plan Note (Addendum)
-   Nephrology consult can be obtained only if she stays to be due for hemodialysis. ?-Her last session was yesterday. ?- We will continue her [place]. ?

## 2021-04-27 NOTE — Plan of Care (Signed)

## 2021-04-27 NOTE — Assessment & Plan Note (Signed)
-   We will continue Norvasc and Coreg. ?

## 2021-04-27 NOTE — Assessment & Plan Note (Signed)
-   This has been fairly stable and it is likely anemia of chronic kidney disease. ?

## 2021-04-27 NOTE — H&P (Signed)
[place]   PATIENT NAME: Bailey [name]    MR#:  [id number]  DATE OF BIRTH:  10/12/1950  DATE OF ADMISSION:  04/26/2021  PRIMARY CARE PHYSICIAN: [name], MD   Patient is coming from: Home  REQUESTING/REFERRING PHYSICIAN: Kommor, Debe Coder, MD   CHIEF COMPLAINT:   Chief Complaint  Patient presents with   Back Pain    HISTORY OF PRESENT ILLNESS:  Bailey [name] is a 71 y.o. Caucasian female with medical history significant for end-stage renal disease on hemodialysis on TTS, type II diabetes mellitus with diabetic retinopathy, nephrolithiasis, hypertension, depression, diverticulosis and anemia, who presented to emergency room with acute onset of right flank pain with occasional radiation to her right lower abdominal quadrant.  The patient has been seen already in the ER twice this week for similar symptoms but left prior to being seen.  Her pain has resolved during last ER evaluation.  Urinalysis then was thought to be positive for UTI and the patient was given a prescription for Cipro which she took for the last couple of days.  She has been on p.o. Percocet for pain.  Despite that she continued to have recurrent breakthrough pain.  She denies any nausea or vomiting.  No fever or chills.  No dysuria or urinary frequency or urgency or flank pain.  No chest pain or palpitations.  No cough or wheezing or dyspnea.  ED Course: When she came to the ER BP was 202/67 with otherwise normal vital signs.  Labs revealed a CO2 33 and a chloride of 91 with a creatinine of 3.26 and a calcium of 8.6.  Albumin was 3.2 and total protein 6.1.  CBC showed anemia at her baseline without leukocytosis.  UA with strongly positive for UTI with more pyuria.  Imaging: CT of the abdomen pelvis with contrast revealed multiple bilateral subcentimeter nonobstructing renal calculi, colonic diverticulosis, postoperative changes consistent with history of prior partial small bowel resection as well as aortic  atherosclerosis.  The patient was given IV Rocephin, IV Benadryl, Lidoderm patch, 1 L bolus of IV lactated Ringer, 3 mg of IV morphine sulfate and 4 mg of IV Zofran.  She will be admitted to an observation medical bed for further evaluation and management. PAST MEDICAL HISTORY:   Past Medical History:  Diagnosis Date   Anemia    patient preference - stopped iron    ARF (acute renal failure) ([place]) 04/2016   dehydration   Chronic kidney disease    stage 5   Depression    Diabetes mellitus    Type II - pt preference - stopped lantus   Diabetic retinopathy ([place])    Diverticulitis    History of kidney stones    passed- 6   Hypertension    Osteomyelitis ([place])    Vitamin B 12 deficiency 06/09/2017   Vitamin D deficiency    Wears partial dentures    upper    PAST SURGICAL HISTORY:   Past Surgical History:  Procedure Laterality Date   AMPUTATION Right 08/14/2016   Procedure: RIGHT 1ST RAY AMPUTATION MID-SHAFT;  Surgeon: Newt Minion, MD;  Location: [place];  Service: Orthopedics;  Laterality: Right;   BASCILIC VEIN TRANSPOSITION Left 06/08/2019   Procedure: LEFT BRACHIOCEPHALIC VEIN CREATION;  Surgeon: Marty Heck, MD;  Location: [place];  Service: Vascular;  Laterality: Left;   INCISION AND DRAINAGE ABSCESS Left 02/11/2014   Procedure: INCISION AND DRAINAGE ABSCESS Left Buttock;  Surgeon: Jackolyn Confer, MD;  [facility];  Service: General;  Laterality: Left;   INSERTION OF DIALYSIS CATHETER Left 09/07/2019   Procedure: INSERTION OF LEFT INTERNAL JUGULAR DIALYSIS CATHETER;  Surgeon: Rosetta Posner, MD;  [facility];  Service: Vascular;  Laterality: Left;   IR REMOVAL TUN CV CATH W/O FL  09/04/2019   LAPAROSCOPIC SMALL BOWEL RESECTION N/A 05/09/2016   Procedure: LAPAROSCOPIC SMALL BOWEL RESECTION;  Surgeon: Michael Boston, MD;  [facility];  Service: General;  Laterality: N/A;   LAPAROSCOPY N/A 05/09/2016   Procedure: LAPAROSCOPY DIAGNOSTIC, LYSIS OF ADHESIONS, SMALL  BOWEL RESECTION X 2;  Surgeon: Michael Boston, MD;  [facility];  Service: General;  Laterality: N/A;   TOE AMPUTATION     left foot great toe    SOCIAL HISTORY:   Social History   Tobacco Use   Smoking status: Never   Smokeless tobacco: Never  Substance Use Topics   Alcohol use: No    Alcohol/week: 0.0 standard drinks    FAMILY HISTORY:   Family History  Problem Relation Age of Onset   Diabetes Mother    Asthma Sister     DRUG ALLERGIES:  No Known Allergies  REVIEW OF SYSTEMS:   ROS As per history of present illness. All pertinent systems were reviewed above. Constitutional, HEENT, cardiovascular, respiratory, GI, GU, musculoskeletal, neuro, psychiatric, endocrine, integumentary and hematologic systems were reviewed and are otherwise negative/unremarkable except for positive findings mentioned above in the HPI.   MEDICATIONS AT HOME:   Prior to Admission medications   Medication Sig Start Date End Date Taking? Authorizing Provider  acetaminophen (TYLENOL) 500 MG tablet Take 1,000 mg by mouth every 6 (six) hours as needed for fever or headache (pain).    Yes [provider]  amLODipine (NORVASC) 2.5 MG tablet Take 2.5 mg by mouth at bedtime. 12/21/20  Yes [provider]  AURYXIA 1 GM 210 MG(Fe) tablet Take 210 mg by mouth 3 (three) times daily. 02/06/21  Yes [provider]  ciprofloxacin (CIPRO) 250 MG tablet Take 250 mg by mouth See admin instructions. Bid x 3 days 04/25/21  Yes [provider]  ibuprofen (ADVIL) 200 MG tablet Take 400 mg by mouth every 6 (six) hours as needed for headache or moderate pain.   Yes [provider]  oxycodone-acetaminophen (PERCOCET) 2.5-325 MG tablet Take 1 tablet by mouth every 6 (six) hours as needed for pain. 04/25/21  Yes [provider]  carvedilol (COREG) 3.125 MG tablet Take 1 tablet (3.125 mg total) by mouth 2 (two) times daily with a meal. Patient not taking: Reported on  04/27/2021 09/08/19   Shelly Coss, MD  gentamicin cream (GARAMYCIN) 0.1 % Apply 1 application topically 2 (two) times daily. Patient not taking: Reported on 04/27/2021 03/25/20   Edrick Kins, DPM      VITAL SIGNS:  Blood pressure (!) 187/78, pulse 74, temperature 98.2 F (36.8 C), temperature source Oral, resp. rate 19, height $RemoveBe'5\' 4"'xTpVmjcoH$  (1.626 m), weight 52.2 kg, SpO2 100 %.  PHYSICAL EXAMINATION:  Physical Exam  GENERAL:  71 y.o.-year-old Caucasian female patient lying in the bed with no acute distress.  EYES: Pupils equal, round, reactive to light and accommodation. No scleral icterus. Extraocular muscles intact.  HEENT: Head atraumatic, normocephalic. Oropharynx and nasopharynx clear.  NECK:  Supple, no jugular venous distention. No thyroid enlargement, no tenderness.  LUNGS: Normal breath sounds bilaterally, no wheezing, rales,rhonchi or crepitation. No use of accessory muscles of respiration.  CARDIOVASCULAR: Regular rate and  rhythm, S1, S2 normal. No murmurs, rubs, or gallops.  ABDOMEN: Soft, nondistended, nontender. Bowel sounds present. No organomegaly or mass.  She had right CVA tenderness. EXTREMITIES: No pedal edema, cyanosis, or clubbing.  NEUROLOGIC: Cranial nerves II through XII are intact. Muscle strength 5/5 in all extremities. Sensation intact. Gait not checked.  PSYCHIATRIC: The patient is alert and oriented x 3.  Normal affect and good eye contact. SKIN: No obvious rash, lesion, or ulcer.   LABORATORY PANEL:   CBC Recent Labs  Lab 04/26/21 2356  WBC 4.4  HGB 10.3*  HCT 31.4*  PLT 115*   ------------------------------------------------------------------------------------------------------------------  Chemistries  Recent Labs  Lab 04/26/21 2356  NA 136  K 3.6  CL 91*  CO2 33*  GLUCOSE 125*  BUN 16  CREATININE 3.26*  CALCIUM 8.6*  AST 17  ALT 9  ALKPHOS 49  BILITOT 0.8    ------------------------------------------------------------------------------------------------------------------  Cardiac Enzymes No results for input(s): TROPONINI in the last 168 hours. ------------------------------------------------------------------------------------------------------------------  RADIOLOGY:  CT ABDOMEN PELVIS W CONTRAST  Result Date: 04/27/2021 CLINICAL DATA:  Right flank pain. EXAM: CT ABDOMEN AND PELVIS WITH CONTRAST TECHNIQUE: Multidetector CT imaging of the abdomen and pelvis was performed using the standard protocol following bolus administration of intravenous contrast. RADIATION DOSE REDUCTION: This exam was performed according to the departmental dose-optimization program which includes automated exposure control, adjustment of the mA and/or kV according to patient size and/or use of iterative reconstruction technique. CONTRAST:  165mL OMNIPAQUE IOHEXOL 300 MG/ML  SOLN COMPARISON:  April 22, 2021 FINDINGS: Lower chest: Mild atelectatic changes are seen within the bilateral lung bases. Hepatobiliary: No focal liver abnormality is seen. The gallbladder is moderately distended without evidence of gallstones, gallbladder wall thickening, or biliary dilatation. Pancreas: Unremarkable. No pancreatic ductal dilatation or surrounding inflammatory changes. Spleen: Normal in size without focal abnormality. Adrenals/Urinary Tract: Adrenal glands are unremarkable. The kidneys are atrophic in appearance with diffuse renal cortical thinning. Multiple bilateral 2 mm nonobstructing renal calculi are seen. Prominent bilateral extrarenal pelvis are also noted, without obstructing renal calculi. Bladder is unremarkable. Stomach/Bowel: Stomach is within normal limits. Appendix appears normal. Surgically anastomosed bowel is seen within the medial aspect of the left lower quadrant and anterior medial aspect of the mid to lower right abdomen. No evidence of bowel dilatation. Stool is seen  throughout the large bowel. This is slightly more prominent within the mid and distal portions of the sigmoid colon noninflamed diverticula are seen throughout the large bowel. Vascular/Lymphatic: Aortic atherosclerosis. No enlarged abdominal or pelvic lymph nodes. Reproductive: Tiny tortuous enhancing vessels are seen along the periphery of an otherwise normal appearing uterus. The bilateral adnexa are unremarkable. Other: No abdominal wall hernia or abnormality. No abdominopelvic ascites. Musculoskeletal: A stable hemangioma is seen within the L1 vertebral body. Degenerative changes are noted throughout the lumbar spine. IMPRESSION: 1. Multiple bilateral subcentimeter nonobstructing renal calculi. 2. Colonic diverticulosis. 3. Postoperative changes consistent with history of prior partial small bowel resection. Aortic Atherosclerosis (ICD10-I70.0). Electronically Signed   By: Virgina Norfolk M.D.   On: 04/27/2021 02:37      IMPRESSION AND PLAN:  Assessment and Plan: * Pyelonephritis - The patient will be admitted to a medical bed. - Pain management will be provided. - The patient will be continued on IV Rocephin. - We will follow urine culture and sensitivity. - We will check lactic acid level.   ESRD on dialysis [facility]) - Nephrology consult can be obtained only if she stays to be due for hemodialysis. -  Her last session was yesterday. - We will continue her [place].  Benign essential HTN - We will continue Norvasc and Coreg.  Normocytic anemia - This has been fairly stable and it is likely anemia of chronic kidney disease.       DVT prophylaxis: Lovenox, renally adjusted. Advanced Care Planning:  Code Status: full code. Family Communication:  The plan of care was discussed in details with the patient (and family). I answered all questions. The patient agreed to proceed with the above mentioned plan. Further management will depend upon hospital course. Disposition Plan: Back to  previous home environment Consults called: none. All the records are reviewed and case discussed with ED provider.  Status is: Observation  I certify that at the time of admission, it is my clinical judgment that the patient will require hospital care extending less than 2 midnights.                            Dispo: The patient is from: Home              Anticipated d/c is to: Home              Patient currently is not medically stable to d/c.              Difficult to place patient: No  Christel Mormon M.D on 04/27/2021 at 6:06 AM  [facility]   From 7 PM-7 AM, contact night-coverage [email or web address]  CC: Primary care physician; [name], MD

## 2021-04-27 NOTE — Care Management (Signed)
?  Transition of Care (TOC) Screening Note ? ? ?Patient Details  ?Name: Bailey [name] ?Date of Birth: [birth year] ? ? ?Transition of Care (TOC) CM/SW Contact:    ?Carles Collet, RN ?Phone Number: ?04/27/2021, 4:23 PM ? ? ? ?Transition of Care Department [facility]) has reviewed patient and no TOC needs have been identified at this time. We will continue to monitor patient advancement through interdisciplinary progression rounds. If new patient transition needs arise, please place a TOC consult. ?  ?

## 2021-04-27 NOTE — Consult Note (Signed)
[name] ?Admit Date: 04/26/2021 ?04/27/2021 ?Rexene Agent ?Requesting Physician:  Sloan Leiter MD ? ?Reason for Consult:  ESRD comanagement ?HPI:  ?75F ESRD THS [place] LUE AVF preesnted to ED with ongoing R flank pain, seen prev in ED and given cipro for ? Pyelo without improvement.  In ED noted to have inc BPs, afebrile,and UA suggestive of UTI.  Placed on ceftriaxone, admitted for presumed pyelonephritis.  CT Abd with numerous small stones not causing obstruction. ? ?Last HD 3/4, Left after full tx 0.4kg above EDW.  BPs usually run up at HD ? ?Outpt HD Orders ?Unit: [place] ?Days: THS ?Time: 3.5h ?Dialyzer: F180 ?EDW: 56kg ?K/Ca: 2/2 ?Access: LUE AVF ?Needle Size: 15g ?BFR/DFR: 350/500 ?UF Proflie: UFP 2 ?VDRA: Hectorol 82mcg qTx ?EPO: none of late ?Heparin: none ? ?Other PMH DM2, HTN, hx/o nephrolithiasis, diverticular disease ? ?Currently eating lunch. Not too much pain.   ? ?ROS ?Balance of 12 systems is negative w/ exceptions as above ? ?PMH  ?Past Medical History:  ?Diagnosis Date  ? Anemia   ? patient preference - stopped iron   ? ARF (acute renal failure) ([place]) 04/2016  ? dehydration  ? Chronic kidney disease   ? stage 5  ? Depression   ? Diabetes mellitus   ? Type II - pt preference - stopped lantus  ? Diabetic retinopathy ([place])   ? Diverticulitis   ? History of kidney stones   ? passed- 4  ? Hypertension   ? Osteomyelitis ([place])   ? Vitamin B 12 deficiency 06/09/2017  ? Vitamin D deficiency   ? Wears partial dentures   ? upper  ? ?PSH  ?Past Surgical History:  ?Procedure Laterality Date  ? AMPUTATION Right 08/14/2016  ? Procedure: RIGHT 1ST RAY AMPUTATION MID-SHAFT;  Surgeon: Newt Minion, MD;  Location: [place];  Service: Orthopedics;  Laterality: Right;  ? BASCILIC VEIN TRANSPOSITION Left 06/08/2019  ? Procedure: LEFT BRACHIOCEPHALIC VEIN CREATION;  Surgeon: Marty Heck, MD;  Location: [place];  Service: Vascular;  Laterality: Left;  ? INCISION AND DRAINAGE ABSCESS Left 02/11/2014  ? Procedure:  INCISION AND DRAINAGE ABSCESS Left Buttock;  Surgeon: Jackolyn Confer, MD;  [facility];  Service: General;  Laterality: Left;  ? INSERTION OF DIALYSIS CATHETER Left 09/07/2019  ? Procedure: INSERTION OF LEFT INTERNAL JUGULAR DIALYSIS CATHETER;  Surgeon: Rosetta Posner, MD;  [facility];  Service: Vascular;  Laterality: Left;  ? IR REMOVAL TUN CV CATH W/O FL  09/04/2019  ? LAPAROSCOPIC SMALL BOWEL RESECTION N/A 05/09/2016  ? Procedure: LAPAROSCOPIC SMALL BOWEL RESECTION;  Surgeon: Michael Boston, MD;  [facility];  Service: General;  Laterality: N/A;  ? LAPAROSCOPY N/A 05/09/2016  ? Procedure: LAPAROSCOPY DIAGNOSTIC, LYSIS OF ADHESIONS, SMALL BOWEL RESECTION X 2;  Surgeon: Michael Boston, MD;  [facility];  Service: General;  Laterality: N/A;  ? TOE AMPUTATION    ? left foot great toe  ? ?FH  ?Family History  ?Problem Relation Age of Onset  ? Diabetes Mother   ? Asthma Sister   ? ?SH  reports that she has never smoked. She has never used smokeless tobacco. She reports that she does not drink alcohol and does not use drugs. ?Allergies No Known Allergies ?Home medications ?Prior to Admission medications   ?Medication Sig Start Date End Date Taking? Authorizing Provider  ?acetaminophen (TYLENOL) 500 MG tablet Take 1,000 mg by mouth every 6 (six) hours as needed for fever or headache (pain).  Yes [provider]  ?amLODipine (NORVASC) 2.5 MG tablet Take 2.5 mg by mouth at bedtime. 12/21/20  Yes [provider]  ?AURYXIA 1 GM 210 MG(Fe) tablet Take 210 mg by mouth 3 (three) times daily. 02/06/21  Yes [provider]  ?ciprofloxacin (CIPRO) 250 MG tablet Take 250 mg by mouth See admin instructions. Bid x 3 days 04/25/21  Yes [provider]  ?ibuprofen (ADVIL) 200 MG tablet Take 400 mg by mouth every 6 (six) hours as needed for headache or moderate pain.   Yes [provider]  ?oxycodone-acetaminophen (PERCOCET) 2.5-325 MG tablet Take 1 tablet by mouth every 6 (six)  hours as needed for pain. 04/25/21  Yes [provider]  ?carvedilol (COREG) 3.125 MG tablet Take 1 tablet (3.125 mg total) by mouth 2 (two) times daily with a meal. ?Patient not taking: Reported on 04/27/2021 09/08/19   Shelly Coss, MD  ?gentamicin cream (GARAMYCIN) 0.1 % Apply 1 application topically 2 (two) times daily. ?Patient not taking: Reported on 04/27/2021 03/25/20   Edrick Kins, DPM  ? ? ?Current Medications ?Scheduled Meds: ? amLODipine  2.5 mg Oral QHS  ? carvedilol  3.125 mg Oral BID WC  ? enoxaparin (LOVENOX) injection  30 mg Subcutaneous Q24H  ? ferric citrate  210 mg Oral TID  ? lidocaine  1 patch Transdermal Q24H  ? ?Continuous Infusions: ? [START ON 04/28/2021] cefTRIAXone (ROCEPHIN)  IV    ? ?PRN Meds:.acetaminophen **OR** acetaminophen, labetalol, magnesium hydroxide, morphine injection, ondansetron **OR** ondansetron (ZOFRAN) IV, oxyCODONE-acetaminophen, traZODone ? ?CBC ?Recent Labs  ?Lab 04/22/21 ?0234 04/23/21 ?0221 04/26/21 ?2356  ?WBC 5.2 6.0 4.4  ?NEUTROABS 2.3 3.2 2.4  ?HGB 10.4* 10.4* 10.3*  ?HCT 31.7* 32.0* 31.4*  ?MCV 97.2 97.3 97.5  ?PLT 123* 124* 115*  ? ?Basic Metabolic Panel ?Recent Labs  ?Lab 04/22/21 ?0234 04/23/21 ?0221 04/26/21 ?2356  ?NA 140 137 136  ?K 3.8 4.0 3.6  ?CL 95* 96* 91*  ?CO2 33* 30 33*  ?GLUCOSE 105* 118* 125*  ?BUN 41* 49* 16  ?CREATININE 5.33* 6.31* 3.26*  ?CALCIUM 9.5 9.0 8.6*  ? ? ?Physical Exam   ?Blood pressure (!) 157/58, pulse 65, temperature 98.3 ?F (36.8 ?C), temperature source Oral, resp. rate 13, height $RemoveBe'5\' 4"'AGpABPMKi$  (1.626 m), weight 52.2 kg, SpO2 100 %. ?GEN: chronically ill appearing NAD ?ENT: NCAT ?EYES: EOMI ?CV: RRR nl s1s2 ?PULM: CTAB ?ABD: s/nt/nd ?SKIN: No rashes/lesions ?EXT:No LEE ?VASC: LUE AVF +B/T ?NEURO: Nonfocal ? ?Assessment ?54F ESRD THS LUE AVF admit with persistent flank pain beign treated for pyelonephritis. ? ?ESRD [facility] LUE AVF [place]: on schedule ?Persistent flank pain, pyuria; ? Pyelo, on ceftriaxone ?Anemia: Hb stable, not on  outpt ESA ?BMM: cont binder, hectorol if stays through dialysis ?HTN/Vol: chronic levated BPs, EDW seems stable, cont home regimen ?DM2 ? ? ?Plan ?As above, will follow along ? ?Rexene Agent  ?04/27/2021, 1:02 PM ? ? ?  ?

## 2021-04-28 LAB — URINE CULTURE

## 2021-04-28 LAB — CBC
HCT: 26.8 % — ABNORMAL LOW (ref 36.0–46.0)
Hemoglobin: 8.7 g/dL — ABNORMAL LOW (ref 12.0–15.0)
MCH: 31.8 pg (ref 26.0–34.0)
MCHC: 32.5 g/dL (ref 30.0–36.0)
MCV: 97.8 fL (ref 80.0–100.0)
Platelets: 96 10*3/uL — ABNORMAL LOW (ref 150–400)
RBC: 2.74 MIL/uL — ABNORMAL LOW (ref 3.87–5.11)
RDW: 14.1 % (ref 11.5–15.5)
WBC: 4.9 10*3/uL (ref 4.0–10.5)
nRBC: 0 % (ref 0.0–0.2)

## 2021-04-28 LAB — BASIC METABOLIC PANEL
Anion gap: 10 (ref 5–15)
BUN: 22 mg/dL (ref 8–23)
CO2: 32 mmol/L (ref 22–32)
Calcium: 8.6 mg/dL — ABNORMAL LOW (ref 8.9–10.3)
Chloride: 92 mmol/L — ABNORMAL LOW (ref 98–111)
Creatinine, Ser: 4.39 mg/dL — ABNORMAL HIGH (ref 0.44–1.00)
GFR, Estimated: 10 mL/min — ABNORMAL LOW (ref >60)
Glucose, Bld: 90 mg/dL (ref 70–99)
Potassium: 4.2 mmol/L (ref 3.5–5.1)
Sodium: 134 mmol/L — ABNORMAL LOW (ref 135–145)

## 2021-04-28 LAB — HEPATITIS B SURFACE ANTIGEN: Hepatitis B Surface Ag: NONREACTIVE

## 2021-04-28 MED ORDER — SODIUM CHLORIDE 0.9 % IV SOLN: 100.0000 mL | INTRAVENOUS | Status: DC | PRN

## 2021-04-28 MED ORDER — AMLODIPINE BESYLATE 5 MG PO TABS
5.0000 mg | ORAL_TABLET | Freq: Every day | ORAL | Status: DC
  Administered 2021-04-28 – 2021-04-29 (×2): 5 mg via ORAL
  Filled 2021-04-28 (×2): qty 1

## 2021-04-28 MED ORDER — LIDOCAINE HCL (PF) 1 % IJ SOLN: 5.0000 mL | INTRAMUSCULAR | Status: DC | PRN

## 2021-04-28 MED ORDER — HEPARIN SODIUM (PORCINE) 1000 UNIT/ML DIALYSIS: 1000.0000 [IU] | INTRAMUSCULAR | Status: DC | PRN

## 2021-04-28 MED ORDER — ALTEPLASE 2 MG IJ SOLR: 2.0000 mg | Freq: Once | INTRAMUSCULAR | Status: DC | PRN

## 2021-04-28 MED ORDER — LIDOCAINE-PRILOCAINE 2.5-2.5 % EX CREA: 1.0000 "application " | TOPICAL_CREAM | CUTANEOUS | Status: DC | PRN

## 2021-04-28 MED ORDER — PENTAFLUOROPROP-TETRAFLUOROETH EX AERO: 1.0000 "application " | INHALATION_SPRAY | CUTANEOUS | Status: DC | PRN

## 2021-04-28 NOTE — Plan of Care (Signed)

## 2021-04-28 NOTE — Progress Notes (Signed)
?[facility] ?Progress Note  ? ?Subjective:    ?Seen and examined patient at bedside. Still c/o mild R-sided flank pain. Denies SOB and CP. Plan for HD 3/7. ? ?Objective ?Vitals:  ? 04/28/21 0800 04/28/21 0834 04/28/21 1220 04/28/21 1500  ?BP: (!) 169/63 (!) 169/63 (!) 162/64   ?Pulse: 72 68 63   ?Resp: 15  14   ?Temp: 98.1 ?F (36.7 ?C)  99 ?F (37.2 ?C)   ?TempSrc: Oral  Oral   ?SpO2: 98%  100%   ?Weight:    58.1 kg  ?Height:      ? ?Physical Exam ?General: Chronically-ill appearing; NAD ?Heart: S1 and S2; RRR;  ?Lungs: Clear throughout ?Abdomen: Soft and non-tender ?Extremities: No edema BLLE ?Dialysis Access: L AVF  ? ?Filed Weights  ? 04/26/21 2308 04/28/21 1500  ?Weight: 52.2 kg 58.1 kg  ? ? ?Intake/Output Summary (Last 24 hours) at 04/28/2021 1518 ?Last data filed at 04/28/2021 0800 ?Gross per 24 hour  ?Intake --  ?Output 50 ml  ?Net -50 ml  ? ? ?Additional Objective ?Labs: ?Basic Metabolic Panel: ?Recent Labs  ?Lab 04/23/21 ?0221 04/26/21 ?2356 04/28/21 ?0201  ?NA 137 136 134*  ?K 4.0 3.6 4.2  ?CL 96* 91* 92*  ?CO2 30 33* 32  ?GLUCOSE 118* 125* 90  ?BUN 49* 16 22  ?CREATININE 6.31* 3.26* 4.39*  ?CALCIUM 9.0 8.6* 8.6*  ? ?Liver Function Tests: ?Recent Labs  ?Lab 04/26/21 ?2356  ?AST 17  ?ALT 9  ?ALKPHOS 49  ?BILITOT 0.8  ?PROT 6.1*  ?ALBUMIN 3.2*  ? ?No results for input(s): LIPASE, AMYLASE in the last 168 hours. ?CBC: ?Recent Labs  ?Lab 04/22/21 ?0234 04/23/21 ?0221 04/26/21 ?2356 04/28/21 ?0201  ?WBC 5.2 6.0 4.4 4.9  ?NEUTROABS 2.3 3.2 2.4  --   ?HGB 10.4* 10.4* 10.3* 8.7*  ?HCT 31.7* 32.0* 31.4* 26.8*  ?MCV 97.2 97.3 97.5 97.8  ?PLT 123* 124* 115* 96*  ? ?Blood Culture ?   ?Component Value Date/Time  ? [place], CLEAN CATCH 04/27/2021 0341  ? SPECREQUEST  04/27/2021 0341  ?  NONE ?Performed at [facility], [place] [address], [place], [place] [address] ?  ? CULT MULTIPLE SPECIES PRESENT, SUGGEST RECOLLECTION (A) 04/27/2021 0341  ? REPTSTATUS 04/28/2021 FINAL 04/27/2021 0341  ? ? ?Cardiac  Enzymes: ?No results for input(s): CKTOTAL, CKMB, CKMBINDEX, TROPONINI in the last 168 hours. ?CBG: ?No results for input(s): GLUCAP in the last 168 hours. ?Iron Studies: No results for input(s): IRON, TIBC, TRANSFERRIN, FERRITIN in the last 72 hours. ?Lab Results  ?Component Value Date  ? INR 1.5 (H) 09/03/2019  ? INR 1.3 (H) 09/02/2019  ? INR 1.2 08/29/2019  ? ?Studies/Results: ?CT ABDOMEN PELVIS W CONTRAST ? ?Result Date: 04/27/2021 ?CLINICAL DATA:  Right flank pain. EXAM: CT ABDOMEN AND PELVIS WITH CONTRAST TECHNIQUE: Multidetector CT imaging of the abdomen and pelvis was performed using the standard protocol following bolus administration of intravenous contrast. RADIATION DOSE REDUCTION: This exam was performed according to the departmental dose-optimization program which includes automated exposure control, adjustment of the mA and/or kV according to patient size and/or use of iterative reconstruction technique. CONTRAST:  182mL OMNIPAQUE IOHEXOL 300 MG/ML  SOLN COMPARISON:  April 22, 2021 FINDINGS: Lower chest: Mild atelectatic changes are seen within the bilateral lung bases. Hepatobiliary: No focal liver abnormality is seen. The gallbladder is moderately distended without evidence of gallstones, gallbladder wall thickening, or biliary dilatation. Pancreas: Unremarkable. No pancreatic ductal dilatation or surrounding inflammatory changes. Spleen: Normal  in size without focal abnormality. Adrenals/Urinary Tract: Adrenal glands are unremarkable. The kidneys are atrophic in appearance with diffuse renal cortical thinning. Multiple bilateral 2 mm nonobstructing renal calculi are seen. Prominent bilateral extrarenal pelvis are also noted, without obstructing renal calculi. Bladder is unremarkable. Stomach/Bowel: Stomach is within normal limits. Appendix appears normal. Surgically anastomosed bowel is seen within the medial aspect of the left lower quadrant and anterior medial aspect of the mid to lower right  abdomen. No evidence of bowel dilatation. Stool is seen throughout the large bowel. This is slightly more prominent within the mid and distal portions of the sigmoid colon noninflamed diverticula are seen throughout the large bowel. Vascular/Lymphatic: Aortic atherosclerosis. No enlarged abdominal or pelvic lymph nodes. Reproductive: Tiny tortuous enhancing vessels are seen along the periphery of an otherwise normal appearing uterus. The bilateral adnexa are unremarkable. Other: No abdominal wall hernia or abnormality. No abdominopelvic ascites. Musculoskeletal: A stable hemangioma is seen within the L1 vertebral body. Degenerative changes are noted throughout the lumbar spine. IMPRESSION: 1. Multiple bilateral subcentimeter nonobstructing renal calculi. 2. Colonic diverticulosis. 3. Postoperative changes consistent with history of prior partial small bowel resection. Aortic Atherosclerosis (ICD10-I70.0). Electronically Signed   By: Virgina Norfolk M.D.   On: 04/27/2021 02:37   ? ?Medications: ? cefTRIAXone (ROCEPHIN)  IV 1 g (04/28/21 0512)  ? ? amLODipine  5 mg Oral QHS  ? carvedilol  3.125 mg Oral BID WC  ? Chlorhexidine Gluconate Cloth  6 each Topical Daily  ? enoxaparin (LOVENOX) injection  30 mg Subcutaneous Q24H  ? ferric citrate  210 mg Oral TID  ? influenza vaccine adjuvanted  0.5 mL Intramuscular Tomorrow-1000  ? lidocaine  1 patch Transdermal Q24H  ? ? ?Dialysis Orders: ?[place] ?Days: THS ?Time: 3.5h ?Dialyzer: F180 ?EDW: 56kg ?K/Ca: 2/2 ?Access: LUE AVF ?Needle Size: 15g ?BFR/DFR: 350/500 ?UF Proflie: UFP 2 ?VDRA: Hectorol 69mcg qTx ?EPO: none of late ?Heparin: none ? ?Assessment/Plan: ?1. R-sided Pyelonephritis-Continue IV Rocephin; awaiting urine culture results ?2. ESRD - on HD TTS. Plan for HD 3/7 ?3. Anemia of CKD- Hgb trending downward-now 8.7. Not on OP ESA so may start here if indicated. ?4. Secondary hyperparathyroidism - Corr Ca okay. Will check PO4 in AM ?5. HTN/volume - Euvolemic on  exam. Noted elevated Bps, Norvasc recently raised. Monitor trends. ?6. Nutrition - Renal diet with fluid restriction ? ?Tobie Poet, NP ?[facility] ?04/28/2021,3:18 PM ? LOS: 1 day  ?  ?

## 2021-04-28 NOTE — Progress Notes (Signed)
PROGRESS NOTE        PATIENT DETAILS Name: Bailey [name] Age: 71 y.o. Sex: female Date of Birth: 1950/05/22 Admit Date: 04/26/2021 Admitting Physician Christel Mormon, MD WLN:[id number], Mechele Claude, MD  Brief Summary: Patient is a 71 y.o.  female with history of ESRD on HD TTS-presenting to the ED (second visit) with right flank pain.  She was thought to have pyelonephritis and subsequently admitted to the hospitalist service.  Significant Hospital events: 2/28>> ED visit-right flank pain-sent home on oral ciprofloxacin-thought to have pyelonephritis 3/04>> presented back to the ED with severe right flank pain-admit to [facility] service.  Significant imaging studies: 2/28>> CT renal stone study: No acute intra-abdominal pathology. 3/05>> CT abdomen/pelvis: Multiple bilateral subcentimeter nonobstructing renal calculi, colonic diverticulosis  Significant microbiology data: 3/5>> respiratory virus panel: Positive for rhinovirus 3/5>> urine culture: Pending  Procedures: None  Consults:  Nephrology  Subjective: Right flank pain has improved-but still present.  Objective: Vitals: Blood pressure (!) 169/63, pulse 68, temperature 98.1 F (36.7 C), temperature source Oral, resp. rate 15, height $RemoveBe'5\' 4"'aqPgIHHHk$  (1.626 m), weight 52.2 kg, SpO2 98 %.   Exam: Gen Exam:Alert awake-not in any distress HEENT:atraumatic, normocephalic Chest: B/L clear to auscultation anteriorly CVS:S1S2 regular Abdomen:soft non tender, non distended.  Right CVA angle still tender but significantly less compared to yesterday. Extremities:no edema Neurology: Non focal Skin: no rash   Pertinent Labs/Radiology: CBC Latest Ref Rng & Units 04/28/2021 04/26/2021 04/23/2021  WBC 4.0 - 10.5 K/uL 4.9 4.4 6.0  Hemoglobin 12.0 - 15.0 g/dL 8.7(L) 10.3(L) 10.4(L)  Hematocrit 36.0 - 46.0 % 26.8(L) 31.4(L) 32.0(L)  Platelets 150 - 400 K/uL 96(L) 115(L) 124(L)    Lab Results  Component Value Date   NA 134 (L)  04/28/2021   K 4.2 04/28/2021   CL 92 (L) 04/28/2021   CO2 32 04/28/2021      Assessment/Plan: Right-sided pyelonephritis: Right flank pain improving-awaiting urine culture-continue IV Rocephin (she failed outpatient therapy with ciprofloxacin).  Given that she failed outpatient therapy-reasonable to continue with IV Rocephin for a few more days before consideration of discharge.  ESRD on HD TTS: Nephrology consulted for HD needs.  Normocytic anemia: Likely secondary to CKD-defer Aranesp/IV iron to nephrology service  HTN: BP creeping up-increase Norvasc dosage to 5 mg, continue Coreg.  Follow and optimize.    History of DM-2: Being managed with diet/lifestyle modification-no longer on medications.  BMI: Estimated body mass index is 19.75 kg/m as calculated from the following:   Height as of this encounter: $RemoveBeforeD'5\' 4"'fDSpsjYlVDQvff$  (1.626 m).   Weight as of this encounter: 52.2 kg.   Code status:   Code Status: Full Code   DVT Prophylaxis: enoxaparin (LOVENOX) injection 30 mg Start: 04/27/21 1800    Family Communication: Spouse-[phone] updated over the phone on 3/6   Disposition Plan: Status is: Observation The patient will require care spanning > 2 midnights and should be moved to inpatient because: Right-sided pyelonephritis-needs IV antibiotics as patient failed outpatient antimicrobial therapy.   Planned Discharge Destination:Home in the next 1-2 days.   Diet: Diet Order             Diet Heart Room service appropriate? Yes; Fluid consistency: Thin  Diet effective now                     Antimicrobial agents: Anti-infectives (  From admission, onward)    Start     Dose/Rate Route Frequency Ordered Stop   04/28/21 0600  cefTRIAXone (ROCEPHIN) 1 g in sodium chloride 0.9 % 100 mL IVPB        1 g 200 mL/hr over 30 Minutes Intravenous Every 24 hours 04/27/21 0552     04/27/21 0445  cefTRIAXone (ROCEPHIN) 1 g in sodium chloride 0.9 % 100 mL IVPB        1 g 200  mL/hr over 30 Minutes Intravenous  Once 04/27/21 0433 04/27/21 0653        MEDICATIONS: Scheduled Meds:  amLODipine  2.5 mg Oral QHS   carvedilol  3.125 mg Oral BID WC   Chlorhexidine Gluconate Cloth  6 each Topical Daily   enoxaparin (LOVENOX) injection  30 mg Subcutaneous Q24H   ferric citrate  210 mg Oral TID   influenza vaccine adjuvanted  0.5 mL Intramuscular Tomorrow-1000   lidocaine  1 patch Transdermal Q24H   Continuous Infusions:  cefTRIAXone (ROCEPHIN)  IV 1 g (04/28/21 0512)   PRN Meds:.acetaminophen **OR** acetaminophen, labetalol, magnesium hydroxide, morphine injection, ondansetron **OR** ondansetron (ZOFRAN) IV, oxyCODONE-acetaminophen, traZODone   I have personally reviewed following labs and imaging studies  LABORATORY DATA: CBC: Recent Labs  Lab 04/22/21 0234 04/23/21 0221 04/26/21 2356 04/28/21 0201  WBC 5.2 6.0 4.4 4.9  NEUTROABS 2.3 3.2 2.4  --   HGB 10.4* 10.4* 10.3* 8.7*  HCT 31.7* 32.0* 31.4* 26.8*  MCV 97.2 97.3 97.5 97.8  PLT 123* 124* 115* 96*     Basic Metabolic Panel: Recent Labs  Lab 04/22/21 0234 04/23/21 0221 04/26/21 2356 04/28/21 0201  NA 140 137 136 134*  K 3.8 4.0 3.6 4.2  CL 95* 96* 91* 92*  CO2 33* 30 33* 32  GLUCOSE 105* 118* 125* 90  BUN 41* 49* 16 22  CREATININE 5.33* 6.31* 3.26* 4.39*  CALCIUM 9.5 9.0 8.6* 8.6*     GFR: Estimated Creatinine Clearance: 9.5 mL/min (A) (by C-G formula based on SCr of 4.39 mg/dL (H)).  Liver Function Tests: Recent Labs  Lab 04/26/21 2356  AST 17  ALT 9  ALKPHOS 49  BILITOT 0.8  PROT 6.1*  ALBUMIN 3.2*    No results for input(s): LIPASE, AMYLASE in the last 168 hours. No results for input(s): AMMONIA in the last 168 hours.  Coagulation Profile: No results for input(s): INR, PROTIME in the last 168 hours.  Cardiac Enzymes: No results for input(s): CKTOTAL, CKMB, CKMBINDEX, TROPONINI in the last 168 hours.  BNP (last 3 results) No results for input(s): PROBNP in the  last 8760 hours.  Lipid Profile: No results for input(s): CHOL, HDL, LDLCALC, TRIG, CHOLHDL, LDLDIRECT in the last 72 hours.  Thyroid Function Tests: No results for input(s): TSH, T4TOTAL, FREET4, T3FREE, THYROIDAB in the last 72 hours.  Anemia Panel: No results for input(s): VITAMINB12, FOLATE, FERRITIN, TIBC, IRON, RETICCTPCT in the last 72 hours.  Urine analysis:    Component Value Date/Time   COLORURINE YELLOW 04/27/2021 0341   APPEARANCEUR HAZY (A) 04/27/2021 0341   LABSPEC 1.009 04/27/2021 0341   PHURINE 9.0 (H) 04/27/2021 0341   GLUCOSEU NEGATIVE 04/27/2021 0341   HGBUR NEGATIVE 04/27/2021 0341   BILIRUBINUR NEGATIVE 04/27/2021 0341   BILIRUBINUR small 02/10/2014 1127   KETONESUR NEGATIVE 04/27/2021 0341   PROTEINUR 100 (A) 04/27/2021 0341   UROBILINOGEN 1.0 02/10/2014 2233   NITRITE NEGATIVE 04/27/2021 0341   LEUKOCYTESUR LARGE (A) 04/27/2021 0341    Sepsis Labs: Lactic  Acid, Venous    Component Value Date/Time   LATICACIDVEN 1.4 04/27/2021 1042    MICROBIOLOGY: Recent Results (from the past 240 hour(s))  Respiratory (~20 pathogens) panel by PCR     Status: Abnormal   Collection Time: 04/27/21  8:07 AM   Specimen: Nasopharyngeal Swab; Respiratory  Result Value Ref Range Status   Adenovirus NOT DETECTED NOT DETECTED Final   Coronavirus 229E NOT DETECTED NOT DETECTED Final    Comment: (NOTE) The Coronavirus on the Respiratory Panel, DOES NOT test for the novel  Coronavirus (2019 nCoV)    Coronavirus HKU1 NOT DETECTED NOT DETECTED Final   Coronavirus NL63 NOT DETECTED NOT DETECTED Final   Coronavirus OC43 NOT DETECTED NOT DETECTED Final   Metapneumovirus NOT DETECTED NOT DETECTED Final   Rhinovirus / Enterovirus DETECTED (A) NOT DETECTED Final   Influenza A NOT DETECTED NOT DETECTED Final   Influenza B NOT DETECTED NOT DETECTED Final   Parainfluenza Virus 1 NOT DETECTED NOT DETECTED Final   Parainfluenza Virus 2 NOT DETECTED NOT DETECTED Final    Parainfluenza Virus 3 NOT DETECTED NOT DETECTED Final   Parainfluenza Virus 4 NOT DETECTED NOT DETECTED Final   Respiratory Syncytial Virus NOT DETECTED NOT DETECTED Final   Bordetella pertussis NOT DETECTED NOT DETECTED Final   Bordetella Parapertussis NOT DETECTED NOT DETECTED Final   Chlamydophila pneumoniae NOT DETECTED NOT DETECTED Final   Mycoplasma pneumoniae NOT DETECTED NOT DETECTED Final    Comment: Performed at [facility], [address] [place], [place] [address]    RADIOLOGY STUDIES/RESULTS: CT ABDOMEN PELVIS W CONTRAST  Result Date: 04/27/2021 CLINICAL DATA:  Right flank pain. EXAM: CT ABDOMEN AND PELVIS WITH CONTRAST TECHNIQUE: Multidetector CT imaging of the abdomen and pelvis was performed using the standard protocol following bolus administration of intravenous contrast. RADIATION DOSE REDUCTION: This exam was performed according to the departmental dose-optimization program which includes automated exposure control, adjustment of the mA and/or kV according to patient size and/or use of iterative reconstruction technique. CONTRAST:  116mL OMNIPAQUE IOHEXOL 300 MG/ML  SOLN COMPARISON:  April 22, 2021 FINDINGS: Lower chest: Mild atelectatic changes are seen within the bilateral lung bases. Hepatobiliary: No focal liver abnormality is seen. The gallbladder is moderately distended without evidence of gallstones, gallbladder wall thickening, or biliary dilatation. Pancreas: Unremarkable. No pancreatic ductal dilatation or surrounding inflammatory changes. Spleen: Normal in size without focal abnormality. Adrenals/Urinary Tract: Adrenal glands are unremarkable. The kidneys are atrophic in appearance with diffuse renal cortical thinning. Multiple bilateral 2 mm nonobstructing renal calculi are seen. Prominent bilateral extrarenal pelvis are also noted, without obstructing renal calculi. Bladder is unremarkable. Stomach/Bowel: Stomach is within normal limits. Appendix appears  normal. Surgically anastomosed bowel is seen within the medial aspect of the left lower quadrant and anterior medial aspect of the mid to lower right abdomen. No evidence of bowel dilatation. Stool is seen throughout the large bowel. This is slightly more prominent within the mid and distal portions of the sigmoid colon noninflamed diverticula are seen throughout the large bowel. Vascular/Lymphatic: Aortic atherosclerosis. No enlarged abdominal or pelvic lymph nodes. Reproductive: Tiny tortuous enhancing vessels are seen along the periphery of an otherwise normal appearing uterus. The bilateral adnexa are unremarkable. Other: No abdominal wall hernia or abnormality. No abdominopelvic ascites. Musculoskeletal: A stable hemangioma is seen within the L1 vertebral body. Degenerative changes are noted throughout the lumbar spine. IMPRESSION: 1. Multiple bilateral subcentimeter nonobstructing renal calculi. 2. Colonic diverticulosis. 3. Postoperative changes consistent with history of  prior partial small bowel resection. Aortic Atherosclerosis (ICD10-I70.0). Electronically Signed   By: Virgina Norfolk M.D.   On: 04/27/2021 02:37     LOS: 1 day   Oren Binet, MD  [facility]    To contact the attending provider between 7A-7P or the covering provider during after hours 7P-7A, please log into the web site [email or web address] and access using universal [place] password for that web site. If you do not have the password, please call the hospital operator.  04/28/2021, 11:04 AM

## 2021-04-29 ENCOUNTER — Inpatient Hospital Stay (HOSPITAL_COMMUNITY): Payer: Medicare Other

## 2021-04-29 LAB — CBC WITH DIFFERENTIAL/PLATELET
Abs Immature Granulocytes: 0.01 10*3/uL (ref 0.00–0.07)
Basophils Absolute: 0 10*3/uL (ref 0.0–0.1)
Basophils Relative: 0 %
Eosinophils Absolute: 0.2 10*3/uL (ref 0.0–0.5)
Eosinophils Relative: 4 %
HCT: 25.4 % — ABNORMAL LOW (ref 36.0–46.0)
Hemoglobin: 8.2 g/dL — ABNORMAL LOW (ref 12.0–15.0)
Immature Granulocytes: 0 %
Lymphocytes Relative: 36 %
Lymphs Abs: 1.8 10*3/uL (ref 0.7–4.0)
MCH: 31.5 pg (ref 26.0–34.0)
MCHC: 32.3 g/dL (ref 30.0–36.0)
MCV: 97.7 fL (ref 80.0–100.0)
Monocytes Absolute: 0.6 10*3/uL (ref 0.1–1.0)
Monocytes Relative: 11 %
Neutro Abs: 2.4 10*3/uL (ref 1.7–7.7)
Neutrophils Relative %: 49 %
Platelets: 94 10*3/uL — ABNORMAL LOW (ref 150–400)
RBC: 2.6 MIL/uL — ABNORMAL LOW (ref 3.87–5.11)
RDW: 13.8 % (ref 11.5–15.5)
WBC: 4.9 10*3/uL (ref 4.0–10.5)
nRBC: 0 % (ref 0.0–0.2)

## 2021-04-29 LAB — RENAL FUNCTION PANEL
Albumin: 2.7 g/dL — ABNORMAL LOW (ref 3.5–5.0)
Anion gap: 13 (ref 5–15)
BUN: 29 mg/dL — ABNORMAL HIGH (ref 8–23)
CO2: 30 mmol/L (ref 22–32)
Calcium: 8.6 mg/dL — ABNORMAL LOW (ref 8.9–10.3)
Chloride: 89 mmol/L — ABNORMAL LOW (ref 98–111)
Creatinine, Ser: 5.7 mg/dL — ABNORMAL HIGH (ref 0.44–1.00)
GFR, Estimated: 7 mL/min — ABNORMAL LOW (ref >60)
Glucose, Bld: 101 mg/dL — ABNORMAL HIGH (ref 70–99)
Phosphorus: 5 mg/dL — ABNORMAL HIGH (ref 2.5–4.6)
Potassium: 4.3 mmol/L (ref 3.5–5.1)
Sodium: 132 mmol/L — ABNORMAL LOW (ref 135–145)

## 2021-04-29 LAB — HEPATITIS B SURFACE ANTIBODY,QUALITATIVE: Hep B S Ab: REACTIVE — AB

## 2021-04-29 NOTE — Evaluation (Signed)
Physical Therapy Evaluation ?Patient Details ?Name: Bailey [name] ?MRN: [id number] ?DOB: 1950-05-17 ?Today's Date: 04/29/2021 ? ?History of Present Illness ? Pt is a 71 y.o. F who presents 04/26/2021 with right flank pain. Thought to have pyelonephritis and subsequently admitted to the hospitalist service. Significant PMH: ESRD,DM2, bilateral 1st toe amputations.  ?Clinical Impression ? PTA, pt lives with her spouse, is independent with ADL's, and requires handheld assist for ambulation (pt preference vs assistive device). Pt reporting improved right flank pain. Overall, appears to be close to her functional baseline. Ambulating 300 feet with min guard assist. Demonstrates mild static and dynamic instability; pt has history of bilateral first toe amputations as well. Will continue to follow acutely to promote mobility while inpatient. ?   ? ?Recommendations for follow up therapy are one component of a multi-disciplinary discharge planning process, led by the attending physician.  Recommendations may be updated based on patient status, additional functional criteria and insurance authorization. ? ?Follow Up Recommendations No PT follow up (pt declining) ? ?  ?Assistance Recommended at Discharge PRN  ?Patient can return home with the following ? A little help with walking and/or transfers;Help with stairs or ramp for entrance ? ?  ?Equipment Recommendations None recommended by PT  ?Recommendations for Other Services ?    ?  ?Functional Status Assessment Patient has had a recent decline in their functional status and demonstrates the ability to make significant improvements in function in a reasonable and predictable amount of time.  ? ?  ?Precautions / Restrictions Precautions ?Precautions: Fall ?Restrictions ?Weight Bearing Restrictions: No  ? ?  ? ?Mobility ? Bed Mobility ?Overal bed mobility: Modified Independent ?  ?  ?  ?  ?  ?  ?  ?  ? ?Transfers ?Overall transfer level: Needs assistance ?Equipment used:  None ?Transfers: Sit to/from Stand ?Sit to Stand: Min guard ?  ?  ?  ?  ?  ?General transfer comment: Use of momentum to power up, min guard to steady ?  ? ?Ambulation/Gait ?Ambulation/Gait assistance: Min guard ?Gait Distance (Feet): 300 Feet ?Assistive device: 1 person hand held assist ?Gait Pattern/deviations: Step-through pattern, Decreased stride length ?Gait velocity: decreased ?  ?  ?General Gait Details: Pt with mild dynamic instability, requiring HHA for balance, likely baseline ? ?Stairs ?  ?  ?  ?  ?  ? ?Wheelchair Mobility ?  ? ?Modified Rankin (Stroke Patients Only) ?  ? ?  ? ?Balance Overall balance assessment: Mild deficits observed, not formally tested ?  ?  ?  ?  ?  ?  ?  ?  ?  ?  ?  ?  ?  ?  ?  ?  ?  ?  ?   ? ? ? ?Pertinent Vitals/Pain Pain Assessment ?Pain Assessment: Faces ?Faces Pain Scale: Hurts a little bit ?Pain Location: R flank ?Pain Descriptors / Indicators: Discomfort ?Pain Intervention(s): Monitored during session  ? ? ?Home Living Family/patient expects to be discharged to:: Private residence ?Living Arrangements: Spouse/significant other ?Available Help at Discharge: Family ?Type of Home: House ?Home Access: Stairs to enter ?Entrance Stairs-Rails: Right;Left ?Entrance Stairs-Number of Steps: 6 ?  ?Home Layout: One level ?Home Equipment: Conservation officer, nature (2 wheels);Cane - single point ?   ?  ?Prior Function Prior Level of Function : Needs assist ?  ?  ?  ?  ?  ?  ?Mobility Comments: utilizes handheld assist for community ambulation (prefers this over cane or walker) ?ADLs Comments: independent ADL's, friend drives  her to HD ?  ? ? ?Hand Dominance  ?   ? ?  ?Extremity/Trunk Assessment  ? Upper Extremity Assessment ?Upper Extremity Assessment: Overall WFL for tasks assessed ?  ? ?Lower Extremity Assessment ?Lower Extremity Assessment: RLE deficits/detail;LLE deficits/detail ?RLE Deficits / Details: 1st toe amputation ?LLE Deficits / Details: 1st toe amputation ?  ? ?   ?Communication  ?  Communication: No difficulties  ?Cognition Arousal/Alertness: Awake/alert ?Behavior During Therapy: [facility] for tasks assessed/performed ?Overall Cognitive Status: Within Functional Limits for tasks assessed ?  ?  ?  ?  ?  ?  ?  ?  ?  ?  ?  ?  ?  ?  ?  ?  ?  ?  ?  ? ?  ?General Comments General comments (skin integrity, edema, etc.): SpO2 97-98% on RA ? ?  ?Exercises    ? ?Assessment/Plan  ?  ?PT Assessment Patient needs continued PT services  ?PT Problem List Decreased strength;Decreased activity tolerance;Decreased balance;Decreased mobility ? ?   ?  ?PT Treatment Interventions Gait training;Functional mobility training;Stair training;Therapeutic activities;Therapeutic exercise;Balance training;Patient/family education   ? ?PT Goals (Current goals can be found in the Care Plan section)  ?Acute Rehab PT Goals ?Patient Stated Goal: did not state ?PT Goal Formulation: With patient ?Time For Goal Achievement: 05/13/21 ?Potential to Achieve Goals: Good ? ?  ?Frequency Min 3X/week ?  ? ? ?Co-evaluation   ?  ?  ?  ?  ? ? ?  ?AM-PAC PT "6 Clicks" Mobility  ?Outcome Measure Help needed turning from your back to your side while in a flat bed without using bedrails?: None ?Help needed moving from lying on your back to sitting on the side of a flat bed without using bedrails?: None ?Help needed moving to and from a bed to a chair (including a wheelchair)?: A Little ?Help needed standing up from a chair using your arms (e.g., wheelchair or bedside chair)?: A Little ?Help needed to walk in hospital room?: A Little ?Help needed climbing 3-5 steps with a railing? : A Little ?6 Click Score: 20 ? ?  ?End of Session   ?Activity Tolerance: Patient tolerated treatment well ?Patient left: in bed;with call bell/phone within reach;with bed alarm set ?Nurse Communication: Mobility status ?PT Visit Diagnosis: Unsteadiness on feet (R26.81) ?  ? ?Time: 2820-8138 ?PT Time Calculation (min) (ACUTE ONLY): 19 min ? ? ?Charges:   PT Evaluation ?$PT  Eval Low Complexity: 1 Low ?  ?  ?   ? ? ?Wyona Almas, PT, DPT ?Acute Rehabilitation Services ?Pager [phone] ?Office [phone] ? ? ?Deno Etienne ?04/29/2021, 1:59 PM ? ?

## 2021-04-29 NOTE — Plan of Care (Signed)

## 2021-04-29 NOTE — Progress Notes (Signed)
?[facility] ?Progress Note  ? ?Subjective:    ?Seen and examined patient at bedside. Still reporting R flank pain. Denies SOB and CP. Plan for HD today. ? ?Objective ?Vitals:  ? 04/29/21 0400 04/29/21 0500 04/29/21 0733 04/29/21 1141  ?BP: (!) 168/60 (!) 159/55 (!) 141/58 (!) 147/57  ?Pulse: 64 65 65 66  ?Resp: $Remov'11 20 16 18  'KISRJv$ ?Temp: 98.3 ?F (36.8 ?C)  98.7 ?F (37.1 ?C) 98.9 ?F (37.2 ?C)  ?TempSrc: Oral  Oral Oral  ?SpO2: 97% 98% 97% 93%  ?Weight:      ?Height:      ? ?Physical Exam ?General: Chronically-ill appearing; NAD ?Heart: S1 and S2; RRR;  ?Lungs: Clear throughout ?Abdomen: Soft and non-tender ?Extremities: No edema BLLE ?Dialysis Access: L AVF (+) B/T ? ?Filed Weights  ? 04/26/21 2308 04/28/21 1500  ?Weight: 52.2 kg 58.1 kg  ? ? ?Intake/Output Summary (Last 24 hours) at 04/29/2021 1240 ?Last data filed at 04/28/2021 1700 ?Gross per 24 hour  ?Intake 98.59 ml  ?Output --  ?Net 98.59 ml  ? ? ?Additional Objective ?Labs: ?Basic Metabolic Panel: ?Recent Labs  ?Lab 04/26/21 ?2356 04/28/21 ?0201 04/29/21 ?0137  ?NA 136 134* 132*  ?K 3.6 4.2 4.3  ?CL 91* 92* 89*  ?CO2 33* 32 30  ?GLUCOSE 125* 90 101*  ?BUN 16 22 29*  ?CREATININE 3.26* 4.39* 5.70*  ?CALCIUM 8.6* 8.6* 8.6*  ?PHOS  --   --  5.0*  ? ?Liver Function Tests: ?Recent Labs  ?Lab 04/26/21 ?2356 04/29/21 ?0137  ?AST 17  --   ?ALT 9  --   ?ALKPHOS 49  --   ?BILITOT 0.8  --   ?PROT 6.1*  --   ?ALBUMIN 3.2* 2.7*  ? ?No results for input(s): LIPASE, AMYLASE in the last 168 hours. ?CBC: ?Recent Labs  ?Lab 04/23/21 ?0221 04/26/21 ?2356 04/28/21 ?0201 04/29/21 ?3335  ?WBC 6.0 4.4 4.9 4.9  ?NEUTROABS 3.2 2.4  --  2.4  ?HGB 10.4* 10.3* 8.7* 8.2*  ?HCT 32.0* 31.4* 26.8* 25.4*  ?MCV 97.3 97.5 97.8 97.7  ?PLT 124* 115* 96* 94*  ? ?Blood Culture ?   ?Component Value Date/Time  ? Campus, CLEAN CATCH 04/27/2021 0341  ? SPECREQUEST  04/27/2021 0341  ?  NONE ?Performed at [facility], [place] [address] [place], [place] [address] ?  ? CULT MULTIPLE SPECIES  PRESENT, SUGGEST RECOLLECTION (A) 04/27/2021 0341  ? REPTSTATUS 04/28/2021 FINAL 04/27/2021 0341  ? ? ?Cardiac Enzymes: ?No results for input(s): CKTOTAL, CKMB, CKMBINDEX, TROPONINI in the last 168 hours. ?CBG: ?No results for input(s): GLUCAP in the last 168 hours. ?Iron Studies: No results for input(s): IRON, TIBC, TRANSFERRIN, FERRITIN in the last 72 hours. ?Lab Results  ?Component Value Date  ? INR 1.5 (H) 09/03/2019  ? INR 1.3 (H) 09/02/2019  ? INR 1.2 08/29/2019  ? ?Studies/Results: ?No results found. ? ?Medications: ? sodium chloride    ? sodium chloride    ? cefTRIAXone (ROCEPHIN)  IV 1 g (04/29/21 0510)  ? ? amLODipine  5 mg Oral QHS  ? carvedilol  3.125 mg Oral BID WC  ? Chlorhexidine Gluconate Cloth  6 each Topical Daily  ? enoxaparin (LOVENOX) injection  30 mg Subcutaneous Q24H  ? ferric citrate  210 mg Oral TID  ? influenza vaccine adjuvanted  0.5 mL Intramuscular Tomorrow-1000  ? lidocaine  1 patch Transdermal Q24H  ? ? ?Dialysis Orders: ?[place] ?Days: THS ?Time: 3.5h ?Dialyzer: F180 ?EDW: 56kg ?K/Ca: 2/2 ?  Access: LUE AVF ?Needle Size: 15g ?BFR/DFR: 350/500 ?UF Proflie: UFP 2 ?VDRA: Hectorol 67mcg qTx ?EPO: none of late ?Heparin: none ? ?Assessment/Plan: ?1. R-sided Pyelonephritis-Continue IV Rocephin; awaiting urine culture results. Patient still c/o R-sided flank pain. Currently with no white count and remains afebrile. Spoke with Hospitalist who plans on ordering MR Lumbar Spine for further evaluation. ?2. ESRD - on HD TTS. HD today ?3. Anemia of CKD- Hgb trending downward-now 8.2. Not on OP ESA so may start here if indicated. ?4. Secondary hyperparathyroidism - Corr Ca and PO4 okay. ?5. HTN/volume - Euvolemic on exam. Norvasc recently raised. Appears to be improving. Monitor trends. ?6. Nutrition - Renal diet with fluid restriction ?7. Dispo-inpatient ? ?Tobie Poet, NP ?[facility] ?04/29/2021,12:40 PM ? LOS: 2 days  ?  ?

## 2021-04-29 NOTE — Progress Notes (Signed)
PROGRESS NOTE        PATIENT DETAILS Name: Bailey [name] Age: 71 y.o. Sex: female Date of Birth: 01/03/1951 Admit Date: 04/26/2021 Admitting Physician Christel Mormon, MD [id number], Mechele Claude, MD  Brief Summary: Patient is a 71 y.o.  female with history of ESRD on HD TTS-presenting to the ED (second visit) with right flank pain.  She was thought to have pyelonephritis and subsequently admitted to the hospitalist service.  Significant Hospital events: 2/28>> ED visit-right flank pain-sent home on oral ciprofloxacin-thought to have pyelonephritis 3/04>> presented back to the ED with severe right flank pain-admit to [facility] service.  Significant imaging studies: 2/28>> CT renal stone study: No acute intra-abdominal pathology. 3/05>> CT abdomen/pelvis: Multiple bilateral subcentimeter nonobstructing renal calculi, colonic diverticulosis  Significant microbiology data: 3/5>> respiratory virus panel: Positive for rhinovirus 3/5>> urine culture: Multiple species.  Procedures: None  Consults:  Nephrology  Subjective: Claims right flank pain worsened again last night.  She appears comfortable.  Objective: Vitals: Blood pressure (!) 180/71, pulse 63, temperature (!) 97 F (36.1 C), temperature source Temporal, resp. rate 18, height $RemoveBe'5\' 4"'nrzEVKpId$  (1.626 m), weight 58.5 kg, SpO2 100 %.   Exam: Gen Exam:Alert awake-not in any distress HEENT:atraumatic, normocephalic Chest: B/L clear to auscultation anteriorly CVS:S1S2 regular Abdomen:soft non tender, non distended Extremities:no edema Neurology: Non focal Skin: no rash   Pertinent Labs/Radiology: CBC Latest Ref Rng & Units 04/29/2021 04/28/2021 04/26/2021  WBC 4.0 - 10.5 K/uL 4.9 4.9 4.4  Hemoglobin 12.0 - 15.0 g/dL 8.2(L) 8.7(L) 10.3(L)  Hematocrit 36.0 - 46.0 % 25.4(L) 26.8(L) 31.4(L)  Platelets 150 - 400 K/uL 94(L) 96(L) 115(L)    Lab Results  Component Value Date   NA 132 (L) 04/29/2021   K 4.3 04/29/2021   CL  89 (L) 04/29/2021   CO2 30 04/29/2021      Assessment/Plan: Right-sided pyelonephritis: Initially thought to nephritis-which did not respond to ciprofloxacin, she has been on Rocephin since admission-and continues to have flank pain that worsened yesterday.  Unclear to me if she actually has pyelonephritis at this point-however reasonable to continue IV antibiotics for a few more days and see if pain improves with continued therapy.  However I will go ahead and get a MRI of the lumbosacral spine to make sure she does not have any significant spinal pathology.  ESRD on HD TTS: Nephrology consulted for HD needs.  Normocytic anemia: Likely secondary to CKD-defer Aranesp/IV iron to nephrology service  HTN: BP continues to fluctuate-mostly on the higher side-continue Norvasc/Coreg-hopefully will improve post HD today.  Follow and optimize.   History of DM-2: Being managed with diet/lifestyle modification-no longer on medications.  BMI: Estimated body mass index is 22.14 kg/m as calculated from the following:   Height as of this encounter: $RemoveBeforeD'5\' 4"'GxkeugYgYKynkC$  (1.626 m).   Weight as of this encounter: 58.5 kg.   Code status:   Code Status: Full Code   DVT Prophylaxis: enoxaparin (LOVENOX) injection 30 mg Start: 04/27/21 1800    Family Communication: Spouse-[phone] left a voicemail on 3/7.   Disposition Plan: Status is: Observation The patient will require care spanning > 2 midnights and should be moved to inpatient because: Right-sided pyelonephritis-needs IV antibiotics as patient failed outpatient antimicrobial therapy.   Planned Discharge Destination:Home in the next 1-2 days.   Diet: Diet Order  Diet Heart Room service appropriate? Yes; Fluid consistency: Thin  Diet effective now                     Antimicrobial agents: Anti-infectives (From admission, onward)    Start     Dose/Rate Route Frequency Ordered Stop   04/28/21 0600  cefTRIAXone (ROCEPHIN) 1  g in sodium chloride 0.9 % 100 mL IVPB        1 g 200 mL/hr over 30 Minutes Intravenous Every 24 hours 04/27/21 0552     04/27/21 0445  cefTRIAXone (ROCEPHIN) 1 g in sodium chloride 0.9 % 100 mL IVPB        1 g 200 mL/hr over 30 Minutes Intravenous  Once 04/27/21 0433 04/27/21 0653        MEDICATIONS: Scheduled Meds:  amLODipine  5 mg Oral QHS   carvedilol  3.125 mg Oral BID WC   Chlorhexidine Gluconate Cloth  6 each Topical Daily   enoxaparin (LOVENOX) injection  30 mg Subcutaneous Q24H   ferric citrate  210 mg Oral TID   influenza vaccine adjuvanted  0.5 mL Intramuscular Tomorrow-1000   lidocaine  1 patch Transdermal Q24H   Continuous Infusions:  sodium chloride     sodium chloride     cefTRIAXone (ROCEPHIN)  IV 1 g (04/29/21 0510)   PRN Meds:.sodium chloride, sodium chloride, acetaminophen **OR** acetaminophen, alteplase, heparin, labetalol, lidocaine (PF), lidocaine-prilocaine, magnesium hydroxide, morphine injection, ondansetron **OR** ondansetron (ZOFRAN) IV, oxyCODONE-acetaminophen, pentafluoroprop-tetrafluoroeth, traZODone   I have personally reviewed following labs and imaging studies  LABORATORY DATA: CBC: Recent Labs  Lab 04/23/21 0221 04/26/21 2356 04/28/21 0201 04/29/21 0137  WBC 6.0 4.4 4.9 4.9  NEUTROABS 3.2 2.4  --  2.4  HGB 10.4* 10.3* 8.7* 8.2*  HCT 32.0* 31.4* 26.8* 25.4*  MCV 97.3 97.5 97.8 97.7  PLT 124* 115* 96* 94*     Basic Metabolic Panel: Recent Labs  Lab 04/23/21 0221 04/26/21 2356 04/28/21 0201 04/29/21 0137  NA 137 136 134* 132*  K 4.0 3.6 4.2 4.3  CL 96* 91* 92* 89*  CO2 30 33* 32 30  GLUCOSE 118* 125* 90 101*  BUN 49* 16 22 29*  CREATININE 6.31* 3.26* 4.39* 5.70*  CALCIUM 9.0 8.6* 8.6* 8.6*  PHOS  --   --   --  5.0*     GFR: Estimated Creatinine Clearance: 7.7 mL/min (A) (by C-G formula based on SCr of 5.7 mg/dL (H)).  Liver Function Tests: Recent Labs  Lab 04/26/21 2356 04/29/21 0137  AST 17  --   ALT 9  --    ALKPHOS 49  --   BILITOT 0.8  --   PROT 6.1*  --   ALBUMIN 3.2* 2.7*    No results for input(s): LIPASE, AMYLASE in the last 168 hours. No results for input(s): AMMONIA in the last 168 hours.  Coagulation Profile: No results for input(s): INR, PROTIME in the last 168 hours.  Cardiac Enzymes: No results for input(s): CKTOTAL, CKMB, CKMBINDEX, TROPONINI in the last 168 hours.  BNP (last 3 results) No results for input(s): PROBNP in the last 8760 hours.  Lipid Profile: No results for input(s): CHOL, HDL, LDLCALC, TRIG, CHOLHDL, LDLDIRECT in the last 72 hours.  Thyroid Function Tests: No results for input(s): TSH, T4TOTAL, FREET4, T3FREE, THYROIDAB in the last 72 hours.  Anemia Panel: No results for input(s): VITAMINB12, FOLATE, FERRITIN, TIBC, IRON, RETICCTPCT in the last 72 hours.  Urine analysis:    Component Value Date/Time  COLORURINE YELLOW 04/27/2021 0341   APPEARANCEUR HAZY (A) 04/27/2021 0341   LABSPEC 1.009 04/27/2021 0341   PHURINE 9.0 (H) 04/27/2021 0341   GLUCOSEU NEGATIVE 04/27/2021 0341   HGBUR NEGATIVE 04/27/2021 0341   BILIRUBINUR NEGATIVE 04/27/2021 0341   BILIRUBINUR small 02/10/2014 1127   KETONESUR NEGATIVE 04/27/2021 0341   PROTEINUR 100 (A) 04/27/2021 0341   UROBILINOGEN 1.0 02/10/2014 2233   NITRITE NEGATIVE 04/27/2021 0341   LEUKOCYTESUR LARGE (A) 04/27/2021 0341    Sepsis Labs: Lactic Acid, Venous    Component Value Date/Time   LATICACIDVEN 1.4 04/27/2021 1042    MICROBIOLOGY: Recent Results (from the past 240 hour(s))  Urine Culture     Status: Abnormal   Collection Time: 04/27/21  3:41 AM   Specimen: Urine, Clean Catch  Result Value Ref Range Status   Specimen Description URINE, CLEAN CATCH  Final   Special Requests   Final    NONE Performed at [facility], [place] [address], [place], [place] [address]    Culture MULTIPLE SPECIES PRESENT, SUGGEST RECOLLECTION (A)  Final   Report Status 04/28/2021 FINAL  Final  Respiratory  (~20 pathogens) panel by PCR     Status: Abnormal   Collection Time: 04/27/21  8:07 AM   Specimen: Nasopharyngeal Swab; Respiratory  Result Value Ref Range Status   Adenovirus NOT DETECTED NOT DETECTED Final   Coronavirus 229E NOT DETECTED NOT DETECTED Final    Comment: (NOTE) The Coronavirus on the Respiratory Panel, DOES NOT test for the novel  Coronavirus (2019 nCoV)    Coronavirus HKU1 NOT DETECTED NOT DETECTED Final   Coronavirus NL63 NOT DETECTED NOT DETECTED Final   Coronavirus OC43 NOT DETECTED NOT DETECTED Final   Metapneumovirus NOT DETECTED NOT DETECTED Final   Rhinovirus / Enterovirus DETECTED (A) NOT DETECTED Final   Influenza A NOT DETECTED NOT DETECTED Final   Influenza B NOT DETECTED NOT DETECTED Final   Parainfluenza Virus 1 NOT DETECTED NOT DETECTED Final   Parainfluenza Virus 2 NOT DETECTED NOT DETECTED Final   Parainfluenza Virus 3 NOT DETECTED NOT DETECTED Final   Parainfluenza Virus 4 NOT DETECTED NOT DETECTED Final   Respiratory Syncytial Virus NOT DETECTED NOT DETECTED Final   Bordetella pertussis NOT DETECTED NOT DETECTED Final   Bordetella Parapertussis NOT DETECTED NOT DETECTED Final   Chlamydophila pneumoniae NOT DETECTED NOT DETECTED Final   Mycoplasma pneumoniae NOT DETECTED NOT DETECTED Final    Comment: Performed at [facility], [place]. [address], [place], [place] [address]    RADIOLOGY STUDIES/RESULTS: No results found.   LOS: 2 days   Oren Binet, MD  [facility]    To contact the attending provider between 7A-7P or the covering provider during after hours 7P-7A, please log into the web site [email or web address] and access using universal [place] password for that web site. If you do not have the password, please call the hospital operator.  04/29/2021, 1:53 PM

## 2021-04-30 ENCOUNTER — Other Ambulatory Visit (HOSPITAL_COMMUNITY): Payer: Self-pay

## 2021-04-30 ENCOUNTER — Encounter: Payer: Self-pay | Admitting: Hematology and Oncology

## 2021-04-30 LAB — HEPATITIS B SURFACE ANTIBODY, QUANTITATIVE: Hep B S AB Quant (Post): 262.6 m[IU]/mL (ref >9.9)

## 2021-04-30 MED ORDER — LIDOCAINE 5 % EX PTCH
1.0000 | MEDICATED_PATCH | CUTANEOUS | 0 refills | Status: DC
  Filled 2021-04-30: qty 30, 30d supply, fill #0

## 2021-04-30 MED ORDER — CARVEDILOL 3.125 MG PO TABS
3.1250 mg | ORAL_TABLET | Freq: Two times a day (BID) | ORAL | 1 refills | Status: DC
  Filled 2021-04-30: qty 60, 30d supply, fill #0

## 2021-04-30 MED ORDER — CEFDINIR 300 MG PO CAPS
300.0000 mg | ORAL_CAPSULE | Freq: Every day | ORAL | 0 refills | Status: DC
  Filled 2021-04-30: qty 3, 3d supply, fill #0

## 2021-04-30 MED ORDER — AMLODIPINE BESYLATE 5 MG PO TABS
2.5000 mg | ORAL_TABLET | Freq: Every day | ORAL | 3 refills | Status: DC
  Filled 2021-04-30: qty 30, 60d supply, fill #0

## 2021-04-30 NOTE — Progress Notes (Signed)
Pt receives out-pt HD at [facility] on TTS. Pt has an 11:30 chair time. Contacted clinic manager to make clinic aware pt for d/c today and will resume care tomorrow.  ? ?Melven Sartorius ?Renal Navigator ?[phone] ?

## 2021-04-30 NOTE — Discharge Summary (Signed)
PATIENT DETAILS Name: Bailey [name] Age: 71 y.o. Sex: female Date of Birth: August 13, 1950 MRN: [id number]. Admitting Physician: Hannah Beat, MD LIY:IWEZY, Christell Constant, MD  Admit Date: 04/26/2021 Discharge date: 04/30/2021  Recommendations for Outpatient Follow-up:  Follow up with PCP in 1-2 weeks Please obtain CMP/CBC in one week  Admitted From:  Home  Disposition: Home   Discharge Condition: good  CODE STATUS:   Code Status: Full Code   Diet recommendation:  Diet Order             Diet - low sodium heart healthy           Diet Carb Modified           Diet Heart Room service appropriate? Yes; Fluid consistency: Thin  Diet effective now                    Brief Summary: Patient is a 71 y.o.  female with history of ESRD on HD TTS-presenting to the ED (second visit) with right flank pain.  She was thought to have pyelonephritis and subsequently admitted to the hospitalist service.   Significant Hospital events: 2/28>> ED visit-right flank pain-sent home on oral ciprofloxacin-thought to have pyelonephritis 3/04>> presented back to the ED with severe right flank pain-admit to [facility] service.   Significant imaging studies: 2/28>> CT renal stone study: No acute intra-abdominal pathology. 3/05>> CT abdomen/pelvis: Multiple bilateral subcentimeter nonobstructing renal calculi, colonic diverticulosis 3/07>> MRI LS spine: No acute abnormalities.   Significant microbiology data: 3/5>> respiratory virus panel: Positive for rhinovirus 3/5>> urine culture: Multiple species.   Procedures: None   Consults:  Nephrology  Brief Hospital Course: Right-sided pyelonephritis: Initially thought to pyelonephritis which did not respond to ciprofloxacin as an outpatient setting-admitted and started on Rocephin with some improvement in her right flank pain.  Even though she is a dialysis patient she still makes urine however she did not have fever or dysuria.  Urine cultures were not  helpful-showed multiple bacterial morphotypes.  Although the intensity of the pain is improved it is still persistent.  MRI of the lumbosacral spine did not show any acute abnormalities.  Prudent to complete a course of empiric antibiotics to see if this alleviates her pain further.  Suspect pain could be musculoskeletal as well.  If she continues to have persistent right flank pain-further work-up can be pursued in the outpatient setting.  Discussed at length with patient's son Onalee Hua over the phone on 3/8.   ESRD on HD TTS: Nephrology consulted for HD needs.   Normocytic anemia: Likely secondary to CKD-defer Aranesp/IV iron to nephrology service  HTN: BP continues to fluctuate-relatively well controlled today-continue Norvasc/Coreg.     History of DM-2: Being managed with diet/lifestyle modification-no longer on medications.  ?  Cognitive dysfunction: Family concerned that she may be developing some cognitive issues-she has received some narcotics during this hospital stay-which may have exacerbated.  I have explained to patient's son David-that she should follow-up with her primary care practitioner for further work-up of cognitive dysfunction.   BMI: Estimated body mass index is 22.14 kg/m as calculated from the following:   Height as of this encounter: 5\' 4"  (1.626 m).   Weight as of this encounter: 58.5 kg.    Discharge Diagnoses:  Principal Problem:   Pyelonephritis Active Problems:   ESRD on dialysis (HCC)   Benign essential HTN   Normocytic anemia   Discharge Instructions:  Activity:  As tolerated with Full fall precautions  use walker/cane & assistance as needed   Discharge Instructions     Call MD for:  severe uncontrolled pain   Complete by: As directed    Diet - low sodium heart healthy   Complete by: As directed    Diet Carb Modified   Complete by: As directed    Discharge instructions   Complete by: As directed    Follow with Primary MD  [name], MD  in 1-2 weeks  Resume hemodialysis as per prior schedule  Please get a complete blood count and chemistry panel checked by your Primary MD at your next visit, and again as instructed by your Primary MD.  Get Medicines reviewed and adjusted: Please take all your medications with you for your next visit with your Primary MD  Laboratory/radiological data: Please request your Primary MD to go over all hospital tests and procedure/radiological results at the follow up, please ask your Primary MD to get all Hospital records sent to his/her office.  In some cases, they will be blood work, cultures and biopsy results pending at the time of your discharge. Please request that your primary care M.D. follows up on these results.  Also Note the following: If you experience worsening of your admission symptoms, develop shortness of breath, life threatening emergency, suicidal or homicidal thoughts you must seek medical attention immediately by calling 911 or calling your MD immediately  if symptoms less severe.  You must read complete instructions/literature along with all the possible adverse reactions/side effects for all the Medicines you take and that have been prescribed to you. Take any new Medicines after you have completely understood and accpet all the possible adverse reactions/side effects.   Do not drive when taking Pain medications or sleeping medications (Benzodaizepines)  Do not take more than prescribed Pain, Sleep and Anxiety Medications. It is not advisable to combine anxiety,sleep and pain medications without talking with your primary care practitioner  Special Instructions: If you have smoked or chewed Tobacco  in the last 2 yrs please stop smoking, stop any regular Alcohol  and or any Recreational drug use.  Wear Seat belts while driving.  Please note: You were cared for by a hospitalist during your hospital stay. Once you are discharged, your primary care physician will handle any  further medical issues. Please note that NO REFILLS for any discharge medications will be authorized once you are discharged, as it is imperative that you return to your primary care physician (or establish a relationship with a primary care physician if you do not have one) for your post hospital discharge needs so that they can reassess your need for medications and monitor your lab values.   Increase activity slowly   Complete by: As directed       Allergies as of 04/30/2021   No Known Allergies      Medication List     STOP taking these medications    gentamicin cream 0.1 % Commonly known as: GARAMYCIN       TAKE these medications    acetaminophen 500 MG tablet Commonly known as: TYLENOL Take 1,000 mg by mouth every 6 (six) hours as needed for fever or headache (pain).   amLODipine 5 MG tablet Commonly known as: NORVASC Take 0.5 tablets (2.5 mg total) by mouth at bedtime. What changed: medication strength   Auryxia 1 GM 210 MG(Fe) tablet Generic drug: ferric citrate Take 210 mg by mouth 3 (three) times daily.   carvedilol 3.125 MG tablet Commonly  known as: COREG Take 1 tablet (3.125 mg total) by mouth 2 (two) times daily with a meal.   cefdinir 300 MG capsule Commonly known as: OMNICEF Take 1 capsule (300 mg total) by mouth daily.   ciprofloxacin 250 MG tablet Commonly known as: CIPRO Take 250 mg by mouth See admin instructions. Bid x 3 days   ibuprofen 200 MG tablet Commonly known as: ADVIL Take 400 mg by mouth every 6 (six) hours as needed for headache or moderate pain.   lidocaine 5 % Commonly known as: LIDODERM Place 1 patch onto the skin daily. Remove & Discard patch within 12 hours or as directed by MD Start taking on: May 01, 2021   oxycodone-acetaminophen 2.5-325 MG tablet Commonly known as: PERCOCET Take 1 tablet by mouth every 6 (six) hours as needed for pain.        Follow-up Information     [name], MD. Schedule an  appointment as soon as possible for a visit in 1 week(s).   Specialty: Family Medicine Contact information: [address] [place] [address] [phone]         Hemodialysis center Follow up.   Why: Resume at your prior schedule.               No Known Allergies   Other Procedures/Studies: MR LUMBAR SPINE WO CONTRAST  Result Date: 04/29/2021 CLINICAL DATA:  Initial evaluation for lower back pain, right flank pain. EXAM: MRI LUMBAR SPINE WITHOUT CONTRAST TECHNIQUE: Multiplanar, multisequence MR imaging of the lumbar spine was performed. No intravenous contrast was administered. COMPARISON:  Prior CT of the abdomen and pelvis from 04/27/2021. FINDINGS: Segmentation: Standard. Lowest well-formed disc space labeled the L5-S1 level. Alignment: Trace facet mediated anterolisthesis of L5 on S1. Alignment otherwise normal with preservation of the normal lumbar lordosis. Vertebrae: Vertebral body height maintained without acute or chronic fracture. Bone marrow signal intensity diffusely heterogeneous. 2.2 cm benign hemangioma noted within the L1 vertebral body. No worrisome osseous lesions. No abnormal marrow edema. Conus medullaris and cauda equina: Conus extends to the L1 level. Conus and cauda equina appear normal. Paraspinal and other soft tissues: Paraspinous soft tissues demonstrate no acute finding. Disc levels: L1-2:  Unremarkable. L2-3:  Unremarkable. L3-4: Disc desiccation without significant disc bulge. No canal or foraminal stenosis. L4-5: Disc desiccation with mild annular disc bulge. Mild to moderate right greater than left facet hypertrophy. No canal or foraminal stenosis. L5-S1: Trace anterolisthesis. Disc desiccation with mild disc bulge. Central annular fissure. Minimal facet spurring. No canal or lateral recess stenosis. Foramina remain patent. No impingement. IMPRESSION: 1. Trace facet mediated anterolisthesis of L5 on S1 with associated mild disc bulge and central annular  fissure. No significant stenosis or neural impingement. 2. Mild to moderate right greater than left facet hypertrophy at L4-5. 3. Otherwise essentially normal MRI of the lumbar spine for age. No other significant disc pathology or stenosis. No frank neural impingement. Electronically Signed   By: Jeannine Boga M.D.   On: 04/29/2021 21:22   CT ABDOMEN PELVIS W CONTRAST  Result Date: 04/27/2021 CLINICAL DATA:  Right flank pain. EXAM: CT ABDOMEN AND PELVIS WITH CONTRAST TECHNIQUE: Multidetector CT imaging of the abdomen and pelvis was performed using the standard protocol following bolus administration of intravenous contrast. RADIATION DOSE REDUCTION: This exam was performed according to the departmental dose-optimization program which includes automated exposure control, adjustment of the mA and/or kV according to patient size and/or use of iterative reconstruction technique. CONTRAST:  160mL OMNIPAQUE  IOHEXOL 300 MG/ML  SOLN COMPARISON:  April 22, 2021 FINDINGS: Lower chest: Mild atelectatic changes are seen within the bilateral lung bases. Hepatobiliary: No focal liver abnormality is seen. The gallbladder is moderately distended without evidence of gallstones, gallbladder wall thickening, or biliary dilatation. Pancreas: Unremarkable. No pancreatic ductal dilatation or surrounding inflammatory changes. Spleen: Normal in size without focal abnormality. Adrenals/Urinary Tract: Adrenal glands are unremarkable. The kidneys are atrophic in appearance with diffuse renal cortical thinning. Multiple bilateral 2 mm nonobstructing renal calculi are seen. Prominent bilateral extrarenal pelvis are also noted, without obstructing renal calculi. Bladder is unremarkable. Stomach/Bowel: Stomach is within normal limits. Appendix appears normal. Surgically anastomosed bowel is seen within the medial aspect of the left lower quadrant and anterior medial aspect of the mid to lower right abdomen. No evidence of bowel  dilatation. Stool is seen throughout the large bowel. This is slightly more prominent within the mid and distal portions of the sigmoid colon noninflamed diverticula are seen throughout the large bowel. Vascular/Lymphatic: Aortic atherosclerosis. No enlarged abdominal or pelvic lymph nodes. Reproductive: Tiny tortuous enhancing vessels are seen along the periphery of an otherwise normal appearing uterus. The bilateral adnexa are unremarkable. Other: No abdominal wall hernia or abnormality. No abdominopelvic ascites. Musculoskeletal: A stable hemangioma is seen within the L1 vertebral body. Degenerative changes are noted throughout the lumbar spine. IMPRESSION: 1. Multiple bilateral subcentimeter nonobstructing renal calculi. 2. Colonic diverticulosis. 3. Postoperative changes consistent with history of prior partial small bowel resection. Aortic Atherosclerosis (ICD10-I70.0). Electronically Signed   By: Virgina Norfolk M.D.   On: 04/27/2021 02:37   CT Renal Stone Study  Result Date: 04/22/2021 CLINICAL DATA:  Right flank pain EXAM: CT ABDOMEN AND PELVIS WITHOUT CONTRAST TECHNIQUE: Multidetector CT imaging of the abdomen and pelvis was performed following the standard protocol without IV contrast. RADIATION DOSE REDUCTION: This exam was performed according to the departmental dose-optimization program which includes automated exposure control, adjustment of the mA and/or kV according to patient size and/or use of iterative reconstruction technique. COMPARISON:  None. FINDINGS: Lower chest: No acute abnormality. Hepatobiliary: No focal liver abnormality is seen. No gallstones, gallbladder wall thickening, or biliary dilatation. Pancreas: Atrophic but otherwise unremarkable Spleen: Unremarkable Adrenals/Urinary Tract: The adrenal glands are unremarkable. The kidneys are normal in position bilateral lipid demonstrate moderate parenchymal atrophy in keeping with given history of chronic renal insufficiency.  Punctate calcifications within the kidneys bilaterally may represent tiny 1-2 mm nonobstructing caliceal calculi or vascular calcifications. There is no hydronephrosis. There are no ureteral calculi. The bladder is unremarkable. Stomach/Bowel: Pancolonic diverticulosis without acute inflammatory change. Partial small bowel resection noted with multiple anastomotic staple lines noted within the periumbilical region. No evidence of obstruction or focal inflammation. Stomach, small bowel, and large bowel are otherwise unremarkable. Appendix normal. Vascular/Lymphatic: Mild aortoiliac atherosclerotic calcification. No aortic aneurysm. Moderate visceral arteriosclerosis. No pathologic adenopathy within the abdomen and pelvis. Reproductive: Uterus and bilateral adnexa are unremarkable. Other: No abdominal wall hernia. Musculoskeletal: L1 vertebral hemangioma. No suspicious lytic or blastic bone lesions. No acute bone abnormality. IMPRESSION: No acute intra-abdominal pathology identified. No definite radiographic explanation for the patient's reported symptoms. Possible mild bilateral nonobstructing nephrolithiasis. No urolithiasis. No hydronephrosis. Moderate bilateral renal cortical atrophy in keeping with chronic renal insufficiency. Pancolonic diverticulosis without superimposed acute inflammatory change. Aortic Atherosclerosis (ICD10-I70.0). Electronically Signed   By: Fidela Salisbury M.D.   On: 04/22/2021 03:43     TODAY-DAY OF DISCHARGE:  Subjective:   Bailey [name] today has no headache,no chest  abdominal pain,no new weakness tingling or numbness, feels much better wants to go home today.   Objective:   Blood pressure (!) 176/57, pulse 67, temperature 97.7 F (36.5 C), temperature source Oral, resp. rate 17, height $RemoveBe'5\' 4"'ThPyKAmle$  (1.626 m), weight 56.5 kg, SpO2 95 %.  Intake/Output Summary (Last 24 hours) at 04/30/2021 1131 Last data filed at 04/30/2021 1000 Gross per 24 hour  Intake 203.54 ml  Output 2000 ml   Net -1796.46 ml   Filed Weights   04/28/21 1500 04/29/21 1224 04/29/21 1540  Weight: 58.1 kg 58.5 kg 56.5 kg    Exam: Awake Alert, Oriented *3, No new F.N deficits, Normal affect [place].AT,PERRAL Supple Neck,No JVD, No cervical lymphadenopathy appriciated.  Symmetrical Chest wall movement, Good air movement bilaterally, CTAB RRR,No Gallops,Rubs or new Murmurs, No Parasternal Heave +ve B.Sounds, Abd Soft, Non tender, No organomegaly appriciated, No rebound -guarding or rigidity. No Cyanosis, Clubbing or edema, No new Rash or bruise   PERTINENT RADIOLOGIC STUDIES: MR LUMBAR SPINE WO CONTRAST  Result Date: 04/29/2021 CLINICAL DATA:  Initial evaluation for lower back pain, right flank pain. EXAM: MRI LUMBAR SPINE WITHOUT CONTRAST TECHNIQUE: Multiplanar, multisequence MR imaging of the lumbar spine was performed. No intravenous contrast was administered. COMPARISON:  Prior CT of the abdomen and pelvis from 04/27/2021. FINDINGS: Segmentation: Standard. Lowest well-formed disc space labeled the L5-S1 level. Alignment: Trace facet mediated anterolisthesis of L5 on S1. Alignment otherwise normal with preservation of the normal lumbar lordosis. Vertebrae: Vertebral body height maintained without acute or chronic fracture. Bone marrow signal intensity diffusely heterogeneous. 2.2 cm benign hemangioma noted within the L1 vertebral body. No worrisome osseous lesions. No abnormal marrow edema. Conus medullaris and cauda equina: Conus extends to the L1 level. Conus and cauda equina appear normal. Paraspinal and other soft tissues: Paraspinous soft tissues demonstrate no acute finding. Disc levels: L1-2:  Unremarkable. L2-3:  Unremarkable. L3-4: Disc desiccation without significant disc bulge. No canal or foraminal stenosis. L4-5: Disc desiccation with mild annular disc bulge. Mild to moderate right greater than left facet hypertrophy. No canal or foraminal stenosis. L5-S1: Trace anterolisthesis. Disc desiccation  with mild disc bulge. Central annular fissure. Minimal facet spurring. No canal or lateral recess stenosis. Foramina remain patent. No impingement. IMPRESSION: 1. Trace facet mediated anterolisthesis of L5 on S1 with associated mild disc bulge and central annular fissure. No significant stenosis or neural impingement. 2. Mild to moderate right greater than left facet hypertrophy at L4-5. 3. Otherwise essentially normal MRI of the lumbar spine for age. No other significant disc pathology or stenosis. No frank neural impingement. Electronically Signed   By: Jeannine Boga M.D.   On: 04/29/2021 21:22     PERTINENT LAB RESULTS: CBC: Recent Labs    04/28/21 0201 04/29/21 0137  WBC 4.9 4.9  HGB 8.7* 8.2*  HCT 26.8* 25.4*  PLT 96* 94*   CMET CMP     Component Value Date/Time   NA 132 (L) 04/29/2021 0137   K 4.3 04/29/2021 0137   CL 89 (L) 04/29/2021 0137   CO2 30 04/29/2021 0137   GLUCOSE 101 (H) 04/29/2021 0137   BUN 29 (H) 04/29/2021 0137   CREATININE 5.70 (H) 04/29/2021 0137   CREATININE 3.89 (HH) 12/20/2019 1518   CREATININE 0.74 02/10/2014 1220   CALCIUM 8.6 (L) 04/29/2021 0137   PROT 6.1 (L) 04/26/2021 2356   ALBUMIN 2.7 (L) 04/29/2021 0137   AST 17 04/26/2021 2356   AST 13 (L) 12/20/2019 1518   ALT 9  04/26/2021 2356   ALT 8 12/20/2019 1518   ALKPHOS 49 04/26/2021 2356   BILITOT 0.8 04/26/2021 2356   BILITOT 0.4 12/20/2019 1518   GFRNONAA 7 (L) 04/29/2021 0137   GFRNONAA 12 (L) 12/20/2019 1518   GFRAA 13 (L) 10/18/2019 1525    GFR Estimated Creatinine Clearance: 7.7 mL/min (A) (by C-G formula based on SCr of 5.7 mg/dL (H)). No results for input(s): LIPASE, AMYLASE in the last 72 hours. No results for input(s): CKTOTAL, CKMB, CKMBINDEX, TROPONINI in the last 72 hours. Invalid input(s): POCBNP No results for input(s): DDIMER in the last 72 hours. No results for input(s): HGBA1C in the last 72 hours. No results for input(s): CHOL, HDL, LDLCALC, TRIG, CHOLHDL,  LDLDIRECT in the last 72 hours. No results for input(s): TSH, T4TOTAL, T3FREE, THYROIDAB in the last 72 hours.  Invalid input(s): FREET3 No results for input(s): VITAMINB12, FOLATE, FERRITIN, TIBC, IRON, RETICCTPCT in the last 72 hours. Coags: No results for input(s): INR in the last 72 hours.  Invalid input(s): PT Microbiology: Recent Results (from the past 240 hour(s))  Urine Culture     Status: Abnormal   Collection Time: 04/27/21  3:41 AM   Specimen: Urine, Clean Catch  Result Value Ref Range Status   Specimen Description URINE, CLEAN CATCH  Final   Special Requests   Final    NONE Performed at [facility], [place] [address], [place], [place] [address]    Culture MULTIPLE SPECIES PRESENT, SUGGEST RECOLLECTION (A)  Final   Report Status 04/28/2021 FINAL  Final  Respiratory (~20 pathogens) panel by PCR     Status: Abnormal   Collection Time: 04/27/21  8:07 AM   Specimen: Nasopharyngeal Swab; Respiratory  Result Value Ref Range Status   Adenovirus NOT DETECTED NOT DETECTED Final   Coronavirus 229E NOT DETECTED NOT DETECTED Final    Comment: (NOTE) The Coronavirus on the Respiratory Panel, DOES NOT test for the novel  Coronavirus (2019 nCoV)    Coronavirus HKU1 NOT DETECTED NOT DETECTED Final   Coronavirus NL63 NOT DETECTED NOT DETECTED Final   Coronavirus OC43 NOT DETECTED NOT DETECTED Final   Metapneumovirus NOT DETECTED NOT DETECTED Final   Rhinovirus / Enterovirus DETECTED (A) NOT DETECTED Final   Influenza A NOT DETECTED NOT DETECTED Final   Influenza B NOT DETECTED NOT DETECTED Final   Parainfluenza Virus 1 NOT DETECTED NOT DETECTED Final   Parainfluenza Virus 2 NOT DETECTED NOT DETECTED Final   Parainfluenza Virus 3 NOT DETECTED NOT DETECTED Final   Parainfluenza Virus 4 NOT DETECTED NOT DETECTED Final   Respiratory Syncytial Virus NOT DETECTED NOT DETECTED Final   Bordetella pertussis NOT DETECTED NOT DETECTED Final   Bordetella Parapertussis NOT DETECTED NOT  DETECTED Final   Chlamydophila pneumoniae NOT DETECTED NOT DETECTED Final   Mycoplasma pneumoniae NOT DETECTED NOT DETECTED Final    Comment: Performed at [facility], [place]. [address], [place], [place] [address]    FURTHER DISCHARGE INSTRUCTIONS:  Get Medicines reviewed and adjusted: Please take all your medications with you for your next visit with your Primary MD  Laboratory/radiological data: Please request your Primary MD to go over all hospital tests and procedure/radiological results at the follow up, please ask your Primary MD to get all Hospital records sent to his/her office.  In some cases, they will be blood work, cultures and biopsy results pending at the time of your discharge. Please request that your primary care M.D. goes through all the records  of your hospital data and follows up on these results.  Also Note the following: If you experience worsening of your admission symptoms, develop shortness of breath, life threatening emergency, suicidal or homicidal thoughts you must seek medical attention immediately by calling 911 or calling your MD immediately  if symptoms less severe.  You must read complete instructions/literature along with all the possible adverse reactions/side effects for all the Medicines you take and that have been prescribed to you. Take any new Medicines after you have completely understood and accpet all the possible adverse reactions/side effects.   Do not drive when taking Pain medications or sleeping medications (Benzodaizepines)  Do not take more than prescribed Pain, Sleep and Anxiety Medications. It is not advisable to combine anxiety,sleep and pain medications without talking with your primary care practitioner  Special Instructions: If you have smoked or chewed Tobacco  in the last 2 yrs please stop smoking, stop any regular Alcohol  and or any Recreational drug use.  Wear Seat belts while driving.  Please note: You were cared for by a  hospitalist during your hospital stay. Once you are discharged, your primary care physician will handle any further medical issues. Please note that NO REFILLS for any discharge medications will be authorized once you are discharged, as it is imperative that you return to your primary care physician (or establish a relationship with a primary care physician if you do not have one) for your post hospital discharge needs so that they can reassess your need for medications and monitor your lab values.  Total Time spent coordinating discharge including counseling, education and face to face time equals greater than 30 minutes.  SignedOren Binet 04/30/2021 11:31 AM

## 2021-04-30 NOTE — Progress Notes (Signed)
?[facility] ?Progress Note  ? ?Subjective:    ?Seen and examined patient at bedside. She still reports R flank pain. Patient remains afebrile with (-) WBC and MR Lumbar Spine. Spoke with Hospitalist. Plan for discharge today. ? ?Objective ?Vitals:  ? 04/30/21 0509 04/30/21 0752 04/30/21 0833 04/30/21 1153  ?BP:  (!) 176/57  (!) 162/53  ?Pulse: 64 67  65  ?Resp: $Remov'14 17  12  'gLRFuR$ ?Temp:  97.7 ?F (36.5 ?C)  97.9 ?F (36.6 ?C)  ?TempSrc:  Oral  Oral  ?SpO2: 100% 99% 95% 94%  ?Weight:      ?Height:      ? ?Physical Exam ?General: Chronically-ill appearing; NAD ?Heart: S1 and S2; RRR;  ?Lungs: Clear throughout ?Abdomen: Soft and non-tender ?Extremities: No edema BLLE ?Dialysis Access: L AVF (+) B/T ? ?Filed Weights  ? 04/28/21 1500 04/29/21 1224 04/29/21 1540  ?Weight: 58.1 kg 58.5 kg 56.5 kg  ? ? ?Intake/Output Summary (Last 24 hours) at 04/30/2021 1220 ?Last data filed at 04/30/2021 1000 ?Gross per 24 hour  ?Intake 203.54 ml  ?Output 2000 ml  ?Net -1796.46 ml  ? ? ?Additional Objective ?Labs: ?Basic Metabolic Panel: ?Recent Labs  ?Lab 04/26/21 ?2356 04/28/21 ?0201 04/29/21 ?0137  ?NA 136 134* 132*  ?K 3.6 4.2 4.3  ?CL 91* 92* 89*  ?CO2 33* 32 30  ?GLUCOSE 125* 90 101*  ?BUN 16 22 29*  ?CREATININE 3.26* 4.39* 5.70*  ?CALCIUM 8.6* 8.6* 8.6*  ?PHOS  --   --  5.0*  ? ?Liver Function Tests: ?Recent Labs  ?Lab 04/26/21 ?2356 04/29/21 ?0137  ?AST 17  --   ?ALT 9  --   ?ALKPHOS 49  --   ?BILITOT 0.8  --   ?PROT 6.1*  --   ?ALBUMIN 3.2* 2.7*  ? ?No results for input(s): LIPASE, AMYLASE in the last 168 hours. ?CBC: ?Recent Labs  ?Lab 04/26/21 ?2356 04/28/21 ?0201 04/29/21 ?0137  ?WBC 4.4 4.9 4.9  ?NEUTROABS 2.4  --  2.4  ?HGB 10.3* 8.7* 8.2*  ?HCT 31.4* 26.8* 25.4*  ?MCV 97.5 97.8 97.7  ?PLT 115* 96* 94*  ? ?Blood Culture ?   ?Component Value Date/Time  ? [place], CLEAN CATCH 04/27/2021 0341  ? SPECREQUEST  04/27/2021 0341  ?  NONE ?Performed at [facility], [place] [address], [place], [place] [address] ?  ? CULT  MULTIPLE SPECIES PRESENT, SUGGEST RECOLLECTION (A) 04/27/2021 0341  ? REPTSTATUS 04/28/2021 FINAL 04/27/2021 0341  ? ? ?Cardiac Enzymes: ?No results for input(s): CKTOTAL, CKMB, CKMBINDEX, TROPONINI in the last 168 hours. ?CBG: ?No results for input(s): GLUCAP in the last 168 hours. ?Iron Studies: No results for input(s): IRON, TIBC, TRANSFERRIN, FERRITIN in the last 72 hours. ?Lab Results  ?Component Value Date  ? INR 1.5 (H) 09/03/2019  ? INR 1.3 (H) 09/02/2019  ? INR 1.2 08/29/2019  ? ?Studies/Results: ?MR LUMBAR SPINE WO CONTRAST ? ?Result Date: 04/29/2021 ?CLINICAL DATA:  Initial evaluation for lower back pain, right flank pain. EXAM: MRI LUMBAR SPINE WITHOUT CONTRAST TECHNIQUE: Multiplanar, multisequence MR imaging of the lumbar spine was performed. No intravenous contrast was administered. COMPARISON:  Prior CT of the abdomen and pelvis from 04/27/2021. FINDINGS: Segmentation: Standard. Lowest well-formed disc space labeled the L5-S1 level. Alignment: Trace facet mediated anterolisthesis of L5 on S1. Alignment otherwise normal with preservation of the normal lumbar lordosis. Vertebrae: Vertebral body height maintained without acute or chronic fracture. Bone marrow signal intensity diffusely heterogeneous. 2.2 cm benign hemangioma noted within  the L1 vertebral body. No worrisome osseous lesions. No abnormal marrow edema. Conus medullaris and cauda equina: Conus extends to the L1 level. Conus and cauda equina appear normal. Paraspinal and other soft tissues: Paraspinous soft tissues demonstrate no acute finding. Disc levels: L1-2:  Unremarkable. L2-3:  Unremarkable. L3-4: Disc desiccation without significant disc bulge. No canal or foraminal stenosis. L4-5: Disc desiccation with mild annular disc bulge. Mild to moderate right greater than left facet hypertrophy. No canal or foraminal stenosis. L5-S1: Trace anterolisthesis. Disc desiccation with mild disc bulge. Central annular fissure. Minimal facet spurring. No  canal or lateral recess stenosis. Foramina remain patent. No impingement. IMPRESSION: 1. Trace facet mediated anterolisthesis of L5 on S1 with associated mild disc bulge and central annular fissure. No significant stenosis or neural impingement. 2. Mild to moderate right greater than left facet hypertrophy at L4-5. 3. Otherwise essentially normal MRI of the lumbar spine for age. No other significant disc pathology or stenosis. No frank neural impingement. Electronically Signed   By: Jeannine Boga M.D.   On: 04/29/2021 21:22   ? ?Medications: ? cefTRIAXone (ROCEPHIN)  IV 1 g (04/30/21 0502)  ? ? amLODipine  5 mg Oral QHS  ? carvedilol  3.125 mg Oral BID WC  ? Chlorhexidine Gluconate Cloth  6 each Topical Daily  ? enoxaparin (LOVENOX) injection  30 mg Subcutaneous Q24H  ? ferric citrate  210 mg Oral TID  ? influenza vaccine adjuvanted  0.5 mL Intramuscular Tomorrow-1000  ? lidocaine  1 patch Transdermal Q24H  ? ? ?Dialysis Orders: ?[place] ?Days: THS ?Time: 3.5h ?Dialyzer: F180 ?EDW: 56kg ?K/Ca: 2/2 ?Access: LUE AVF ?Needle Size: 15g ?BFR/DFR: 350/500 ?UF Proflie: UFP 2 ?VDRA: Hectorol 43mcg qTx ?EPO: none of late ?Heparin: none ? ?Assessment/Plan: ?1. R-sided Pyelonephritis- Patient still c/o R-sided flank pain. Currently with no white count and remains afebrile. MR Lumbar Spine also (-). Plan for discharge. Patient will be placed on PO ABX. ?2. ESRD - on HD TTS. Received HD 3/7. Resume HD in outpatient per her usual schedule. ?3. Anemia of CKD- Hgb trending downward-now 8.2. Will start ESA in outpatient. ?4. Secondary hyperparathyroidism - Corr Ca and PO4 okay. ?5. HTN/volume - Euvolemic on exam. Norvasc recently raised. Appears to be improving. Monitor trends. ?6. Nutrition - Renal diet with fluid restriction ?7. Dispo-Okay for discharge from renal standpoint ? ?Tobie Poet, NP ?[facility] ?04/30/2021,12:20 PM ? LOS: 3 days  ?  ?

## 2021-04-30 NOTE — TOC Benefit Eligibility Note (Addendum)
Patient Advocate Encounter ? ?Patient Advocate Encounter ?  ?Insurance verification completed.   ?  ?The patient is currently admitted and upon discharge could be taking LIDOCAINE PATCHES. ?  ?The current 30 day co-pay is, $59.28.  ? ?The patient is insured through [employer]. ? ?PA was submitted and APPROVED from 3.8.23 to 12.31.23 ? ? ? ? ?Received notification from [facility] that prior authorization for LIDOCAINE PATCHES is required. ?  ?PA submitted on 3.8.23 ?Key [id number] ?Status is pending ? ?Taralee Marcus R Kyrin Gratz, CPhT ?Patient Advocate ? ? ? ?

## 2021-05-01 DIAGNOSIS — R109 Unspecified abdominal pain: Secondary | ICD-10-CM | POA: Diagnosis not present

## 2021-05-01 DIAGNOSIS — M549 Dorsalgia, unspecified: Secondary | ICD-10-CM | POA: Diagnosis not present

## 2021-05-01 DIAGNOSIS — I700 Atherosclerosis of aorta: Secondary | ICD-10-CM | POA: Diagnosis not present

## 2021-05-01 DIAGNOSIS — N200 Calculus of kidney: Secondary | ICD-10-CM | POA: Diagnosis not present

## 2021-05-01 DIAGNOSIS — N12 Tubulo-interstitial nephritis, not specified as acute or chronic: Secondary | ICD-10-CM | POA: Diagnosis not present

## 2021-05-01 DIAGNOSIS — M5136 Other intervertebral disc degeneration, lumbar region: Secondary | ICD-10-CM | POA: Diagnosis not present

## 2021-05-01 DIAGNOSIS — I10 Essential (primary) hypertension: Secondary | ICD-10-CM | POA: Diagnosis not present

## 2021-05-01 NOTE — TOC Transition Note (Signed)
Transition of care contact from inpatient facility ? ?Date of discharge: 04/30/21 ?Date of contact: 05/01/21 ?Method: Phone ?Spoke to: Patient's Husband ? ?Patient contacted to discuss transition of care from recent inpatient hospitalization. Patient was admitted to [facility] from 04/26/21-04/30/21 with discharge diagnosis of R-sided flank pain-pylenephritis was r/o and MR Lumbar Spine was (-). Patient's husband expresses patient is still c/o R-sided flank pain. Unfortunately did not go to her scheduled HD today d/t the pain. He hasn't picked up the Lidocaine patches yet that were prescribed at discharge. I advised to pick up the Lidocaine patch Rx to start using for pain control. I advised if the pain continues to persist after using the Lidocaine patch, to f/u with her PCP. ? ?Medication changes were reviewed. Patient was discharged on empiric antibiotics: Discussed ABX regimens: Cefdinir 300mg  TTS after HD treatment and Ciprofloxacin 250mg  BID X 3 days.  ? ?Patient will follow up with her outpatient HD unit on: Saturday 05/03/21 at [facility]. ? ?Tobie Poet, NP ?  ?

## 2021-05-02 DIAGNOSIS — D631 Anemia in chronic kidney disease: Secondary | ICD-10-CM | POA: Diagnosis not present

## 2021-05-02 DIAGNOSIS — N184 Chronic kidney disease, stage 4 (severe): Secondary | ICD-10-CM | POA: Diagnosis not present

## 2021-05-02 DIAGNOSIS — M545 Low back pain, unspecified: Secondary | ICD-10-CM | POA: Diagnosis not present

## 2021-05-02 DIAGNOSIS — I10 Essential (primary) hypertension: Secondary | ICD-10-CM | POA: Diagnosis not present

## 2021-05-03 DIAGNOSIS — D509 Iron deficiency anemia, unspecified: Secondary | ICD-10-CM | POA: Diagnosis not present

## 2021-05-03 DIAGNOSIS — N2581 Secondary hyperparathyroidism of renal origin: Secondary | ICD-10-CM | POA: Diagnosis not present

## 2021-05-03 DIAGNOSIS — Z992 Dependence on renal dialysis: Secondary | ICD-10-CM | POA: Diagnosis not present

## 2021-05-03 DIAGNOSIS — N189 Chronic kidney disease, unspecified: Secondary | ICD-10-CM | POA: Diagnosis not present

## 2021-05-03 DIAGNOSIS — N186 End stage renal disease: Secondary | ICD-10-CM | POA: Diagnosis not present

## 2021-05-03 DIAGNOSIS — R110 Nausea: Secondary | ICD-10-CM | POA: Diagnosis not present

## 2021-05-06 ENCOUNTER — Telehealth: Payer: Self-pay | Admitting: Hematology and Oncology

## 2021-05-06 ENCOUNTER — Encounter: Payer: Self-pay | Admitting: Physician Assistant

## 2021-05-06 DIAGNOSIS — N186 End stage renal disease: Secondary | ICD-10-CM | POA: Diagnosis not present

## 2021-05-06 DIAGNOSIS — N189 Chronic kidney disease, unspecified: Secondary | ICD-10-CM | POA: Diagnosis not present

## 2021-05-06 DIAGNOSIS — Z992 Dependence on renal dialysis: Secondary | ICD-10-CM | POA: Diagnosis not present

## 2021-05-06 DIAGNOSIS — M545 Low back pain, unspecified: Secondary | ICD-10-CM | POA: Diagnosis not present

## 2021-05-06 DIAGNOSIS — N2581 Secondary hyperparathyroidism of renal origin: Secondary | ICD-10-CM | POA: Diagnosis not present

## 2021-05-06 DIAGNOSIS — N111 Chronic obstructive pyelonephritis: Secondary | ICD-10-CM | POA: Diagnosis not present

## 2021-05-06 DIAGNOSIS — D509 Iron deficiency anemia, unspecified: Secondary | ICD-10-CM | POA: Diagnosis not present

## 2021-05-06 DIAGNOSIS — R110 Nausea: Secondary | ICD-10-CM | POA: Diagnosis not present

## 2021-05-06 NOTE — Telephone Encounter (Signed)
Scheduled appt per 3/13 referral. Pt is aware of appt date and time. Pt is aware to arrive 15 mins prior to appt time and to bring and updated insurance card. Pt is aware of appt location.   ?

## 2021-05-08 DIAGNOSIS — Z992 Dependence on renal dialysis: Secondary | ICD-10-CM | POA: Diagnosis not present

## 2021-05-08 DIAGNOSIS — N189 Chronic kidney disease, unspecified: Secondary | ICD-10-CM | POA: Diagnosis not present

## 2021-05-08 DIAGNOSIS — D509 Iron deficiency anemia, unspecified: Secondary | ICD-10-CM | POA: Diagnosis not present

## 2021-05-08 DIAGNOSIS — N186 End stage renal disease: Secondary | ICD-10-CM | POA: Diagnosis not present

## 2021-05-08 DIAGNOSIS — N2581 Secondary hyperparathyroidism of renal origin: Secondary | ICD-10-CM | POA: Diagnosis not present

## 2021-05-08 DIAGNOSIS — R110 Nausea: Secondary | ICD-10-CM | POA: Diagnosis not present

## 2021-05-10 ENCOUNTER — Emergency Department (HOSPITAL_COMMUNITY)
Admission: EM | Admit: 2021-05-10 | Discharge: 2021-05-10 | Disposition: A | Discharge status: Home / Self Care | Payer: Medicare Other | Attending: Emergency Medicine | Admitting: Emergency Medicine

## 2021-05-10 ENCOUNTER — Emergency Department (HOSPITAL_COMMUNITY): Payer: Medicare Other

## 2021-05-10 ENCOUNTER — Encounter (HOSPITAL_COMMUNITY): Payer: Self-pay

## 2021-05-10 ENCOUNTER — Other Ambulatory Visit: Payer: Self-pay

## 2021-05-10 DIAGNOSIS — N200 Calculus of kidney: Secondary | ICD-10-CM | POA: Diagnosis not present

## 2021-05-10 DIAGNOSIS — F0390 Unspecified dementia without behavioral disturbance: Secondary | ICD-10-CM | POA: Diagnosis not present

## 2021-05-10 DIAGNOSIS — E1122 Type 2 diabetes mellitus with diabetic chronic kidney disease: Secondary | ICD-10-CM | POA: Diagnosis not present

## 2021-05-10 DIAGNOSIS — R109 Unspecified abdominal pain: Secondary | ICD-10-CM | POA: Diagnosis not present

## 2021-05-10 DIAGNOSIS — Z992 Dependence on renal dialysis: Secondary | ICD-10-CM | POA: Diagnosis not present

## 2021-05-10 DIAGNOSIS — K5730 Diverticulosis of large intestine without perforation or abscess without bleeding: Secondary | ICD-10-CM | POA: Diagnosis not present

## 2021-05-10 DIAGNOSIS — Z79899 Other long term (current) drug therapy: Secondary | ICD-10-CM | POA: Diagnosis not present

## 2021-05-10 DIAGNOSIS — R1031 Right lower quadrant pain: Secondary | ICD-10-CM | POA: Diagnosis not present

## 2021-05-10 DIAGNOSIS — Z87442 Personal history of urinary calculi: Secondary | ICD-10-CM | POA: Insufficient documentation

## 2021-05-10 DIAGNOSIS — N186 End stage renal disease: Secondary | ICD-10-CM | POA: Insufficient documentation

## 2021-05-10 DIAGNOSIS — M545 Low back pain, unspecified: Secondary | ICD-10-CM | POA: Diagnosis not present

## 2021-05-10 DIAGNOSIS — N3289 Other specified disorders of bladder: Secondary | ICD-10-CM | POA: Diagnosis not present

## 2021-05-10 DIAGNOSIS — I120 Hypertensive chronic kidney disease with stage 5 chronic kidney disease or end stage renal disease: Secondary | ICD-10-CM | POA: Diagnosis not present

## 2021-05-10 LAB — CBC WITH DIFFERENTIAL/PLATELET
Abs Immature Granulocytes: 0.02 10*3/uL (ref 0.00–0.07)
Basophils Absolute: 0.1 10*3/uL (ref 0.0–0.1)
Basophils Relative: 1 %
Eosinophils Absolute: 0.3 10*3/uL (ref 0.0–0.5)
Eosinophils Relative: 3 %
HCT: 30 % — ABNORMAL LOW (ref 36.0–46.0)
Hemoglobin: 9.5 g/dL — ABNORMAL LOW (ref 12.0–15.0)
Immature Granulocytes: 0 %
Lymphocytes Relative: 29 %
Lymphs Abs: 2.5 10*3/uL (ref 0.7–4.0)
MCH: 31.3 pg (ref 26.0–34.0)
MCHC: 31.7 g/dL (ref 30.0–36.0)
MCV: 98.7 fL (ref 80.0–100.0)
Monocytes Absolute: 0.7 10*3/uL (ref 0.1–1.0)
Monocytes Relative: 8 %
Neutro Abs: 4.9 10*3/uL (ref 1.7–7.7)
Neutrophils Relative %: 59 %
Platelets: 162 10*3/uL (ref 150–400)
RBC: 3.04 MIL/uL — ABNORMAL LOW (ref 3.87–5.11)
RDW: 15 % (ref 11.5–15.5)
WBC: 8.4 10*3/uL (ref 4.0–10.5)
nRBC: 0 % (ref 0.0–0.2)

## 2021-05-10 LAB — URINALYSIS, ROUTINE W REFLEX MICROSCOPIC
Bilirubin Urine: NEGATIVE
Glucose, UA: NEGATIVE mg/dL
Hgb urine dipstick: NEGATIVE
Ketones, ur: NEGATIVE mg/dL
Nitrite: NEGATIVE
Protein, ur: >=300 mg/dL — AB
Specific Gravity, Urine: 1.009 (ref 1.005–1.030)
pH: 9 — ABNORMAL HIGH (ref 5.0–8.0)

## 2021-05-10 LAB — COMPREHENSIVE METABOLIC PANEL
ALT: 9 U/L (ref 0–44)
AST: 13 U/L — ABNORMAL LOW (ref 15–41)
Albumin: 3.1 g/dL — ABNORMAL LOW (ref 3.5–5.0)
Alkaline Phosphatase: 49 U/L (ref 38–126)
Anion gap: 10 (ref 5–15)
BUN: 16 mg/dL (ref 8–23)
CO2: 30 mmol/L (ref 22–32)
Calcium: 9 mg/dL (ref 8.9–10.3)
Chloride: 97 mmol/L — ABNORMAL LOW (ref 98–111)
Creatinine, Ser: 3.95 mg/dL — ABNORMAL HIGH (ref 0.44–1.00)
GFR, Estimated: 12 mL/min — ABNORMAL LOW (ref >60)
Glucose, Bld: 99 mg/dL (ref 70–99)
Potassium: 3.7 mmol/L (ref 3.5–5.1)
Sodium: 137 mmol/L (ref 135–145)
Total Bilirubin: 0.4 mg/dL (ref 0.3–1.2)
Total Protein: 5.9 g/dL — ABNORMAL LOW (ref 6.5–8.1)

## 2021-05-10 LAB — LIPASE, BLOOD: Lipase: 39 U/L (ref 11–51)

## 2021-05-10 MED ORDER — PENTAFLUOROPROP-TETRAFLUOROETH EX AERO: 1.0000 "application " | INHALATION_SPRAY | CUTANEOUS | Status: DC | PRN

## 2021-05-10 MED ORDER — CARVEDILOL 3.125 MG PO TABS
3.1250 mg | ORAL_TABLET | Freq: Two times a day (BID) | ORAL | Status: AC
  Administered 2021-05-10: 3.125 mg via ORAL
  Filled 2021-05-10: qty 1

## 2021-05-10 MED ORDER — CHLORHEXIDINE GLUCONATE CLOTH 2 % EX PADS: 6.0000 | MEDICATED_PAD | Freq: Every day | CUTANEOUS | Status: DC

## 2021-05-10 MED ORDER — LIDOCAINE HCL (PF) 1 % IJ SOLN: 5.0000 mL | INTRAMUSCULAR | Status: DC | PRN

## 2021-05-10 MED ORDER — ALTEPLASE 2 MG IJ SOLR: 2.0000 mg | Freq: Once | INTRAMUSCULAR | Status: DC | PRN

## 2021-05-10 MED ORDER — HYDROCODONE-ACETAMINOPHEN 5-325 MG PO TABS
2.0000 | ORAL_TABLET | ORAL | 0 refills | Status: AC | PRN
Start: 2021-05-10 — End: 2021-05-15

## 2021-05-10 MED ORDER — HYDROMORPHONE HCL 1 MG/ML IJ SOLN
0.5000 mg | Freq: Once | INTRAMUSCULAR | Status: AC
  Administered 2021-05-10: 0.5 mg via INTRAVENOUS
  Filled 2021-05-10: qty 1

## 2021-05-10 MED ORDER — SODIUM CHLORIDE 0.9 % IV SOLN: 100.0000 mL | INTRAVENOUS | Status: DC | PRN

## 2021-05-10 MED ORDER — CEPHALEXIN 500 MG PO CAPS: 500.0000 mg | ORAL_CAPSULE | Freq: Four times a day (QID) | ORAL | 0 refills | Status: AC

## 2021-05-10 MED ORDER — SODIUM CHLORIDE 0.9 % IV SOLN
1.0000 g | Freq: Once | INTRAVENOUS | Status: AC
  Administered 2021-05-10: 1 g via INTRAVENOUS
  Filled 2021-05-10: qty 10

## 2021-05-10 MED ORDER — HEPARIN SODIUM (PORCINE) 1000 UNIT/ML DIALYSIS: 1000.0000 [IU] | INTRAMUSCULAR | Status: DC | PRN

## 2021-05-10 MED ORDER — LIDOCAINE-PRILOCAINE 2.5-2.5 % EX CREA: 1.0000 "application " | TOPICAL_CREAM | CUTANEOUS | Status: DC | PRN

## 2021-05-10 MED ORDER — HYDROCODONE-ACETAMINOPHEN 5-325 MG PO TABS
1.0000 | ORAL_TABLET | Freq: Once | ORAL | Status: AC
  Administered 2021-05-10: 1 via ORAL
  Filled 2021-05-10: qty 1

## 2021-05-10 MED ORDER — CARVEDILOL 3.125 MG PO TABS: 3.1250 mg | ORAL_TABLET | Freq: Two times a day (BID) | ORAL | Status: DC

## 2021-05-10 NOTE — ED Notes (Signed)
Pt is currently in triage. Rob Hoxie placed order for dilaudid IV. Pt will have to go back into the waiting room so this RN will hold dilaudid until patient has a room in the main ED where she can be monitored. Will ask Rob, PA for something else for pain PO. ?

## 2021-05-10 NOTE — ED Triage Notes (Signed)
Pt reports right flank pain onset this morning associated with dysuria ?

## 2021-05-10 NOTE — ED Provider Triage Note (Signed)
?  Emergency Medicine Provider Triage Evaluation Note ? ?MRN:  [id number]  ?Arrival date & time: 05/10/21    ?Medically screening exam initiated at 1:59 AM.   ?CC:   ?Flank Pain ?  ?HPI:  ?Bailey [name] is a 71 y.o. year-old female presents to the ED with chief complaint of right sided flank pain.  Hx of KS.  States this feels similar.  Denies n/v/d.  Denies urinary complaints.  Rates pain as an 8. ? ?History provided by History provided by patient. ?ROS:  ?-As included in HPI ?PE:  ? ?Vitals:  ? 05/10/21 0154  ?BP: (!) 204/69  ?Pulse: 61  ?Resp: 16  ?Temp: 98 ?F (36.7 ?C)  ?SpO2: 100%  ?  ?Non-toxic appearing ?No respiratory distress ? ?MDM:  ?Based on signs and symptoms, kidney stone is highest on my differential, followed by UTI, pyelo. ?I've ordered CT renal in triage to expedite lab/diagnostic workup. ? ?Patient was informed that the remainder of the evaluation will be completed by another provider, this initial triage assessment does not replace that evaluation, and the importance of remaining in the ED until their evaluation is complete. ? ?  ?Montine Circle, PA-C ?05/10/21 0200 ? ?

## 2021-05-10 NOTE — Discharge Instructions (Signed)
Please start taking antibiotics as prescribed. Stop taking Percocet and start taking Norco tabs for pain as needed.  ?Please go to dialysis on Tuesday as scheduled. Since you are missing dialysis today, please watch your fluids and maintain a low salt diet.  ? ?If you develop worsening pain, fever, difficulty breathing, chest pain, or other new concerning symptoms, please return to the ED.  ?

## 2021-05-10 NOTE — ED Provider Notes (Signed)
?[place] [facility] ?Provider Note ? ? ?CSN: [id number] ?Arrival date & time: 05/10/21  0146 ? ?  ? ?History ?PMH HTN, ESRD on HD, DM, hx diverticulitis, nephrolithiasis, thrombocytopenia, hx osteomyelitis, dementia ?Chief Complaint  ?Patient presents with  ? Flank Pain  ? ? ?Bailey [name] is a 71 y.o. female. ? ?This patient was recently discharged from our hospital for right sided pyelonephritis after failing outpatient antibiotics. She completed a course of IV antibiotics, but it was also thought that this could be musculoskeletal because pain did not substantially improve with antibiotics. She was discharge home with Percocets which per the patient's husband, they stopped taking because it was making her more confused.  She was discharged on March 8 ?Apparently, patient continued to have right flank pain.  Husband reports a possible fall 2 days ago as well that was unwitnessed.  Patient is unable to tell me what happened due to her cognitive state.  He was unclear of the mechanism and if she hit her head or sustain any injuries.  She has had no worsening confusion or mental state, no weakness, numbness, speech changes, headaches, visual changes.  Not had any gait abnormalities or obvious injuries that she has been complaining of other than the right lower back pain.  She denies any chest pain or shortness of breath.  She denies any dysuria or hematuria.  She denies any fevers or chills. ? ? ?Flank Pain ?Pertinent negatives include no chest pain, no abdominal pain, no headaches and no shortness of breath.  ? ?  ? ?Home Medications ?Prior to Admission medications   ?Medication Sig Start Date End Date Taking? Authorizing Provider  ?acetaminophen (TYLENOL) 500 MG tablet Take 1,000 mg by mouth every 6 (six) hours as needed for fever or headache (pain).    Yes [provider]  ?amLODipine (NORVASC) 5 MG tablet Take 1/2 tablet (2.5 mg total) by mouth at bedtime. 04/30/21  Yes  Ghimire, Henreitta Leber, MD  ?Lorin Picket 1 GM 210 MG(Fe) tablet Take 210 mg by mouth 3 (three) times daily. 02/06/21  Yes [provider]  ?carvedilol (COREG) 3.125 MG tablet Take 1 tablet (3.125 mg total) by mouth 2 (two) times daily with a meal. ?Patient taking differently: Take 6.25 mg by mouth 2 (two) times daily with a meal. 04/30/21  Yes Ghimire, Henreitta Leber, MD  ?cephALEXin (KEFLEX) 500 MG capsule Take 1 capsule (500 mg total) by mouth 4 (four) times daily for 10 days. 05/10/21 05/20/21 Yes Davarious Tumbleson, Adora Fridge, PA-C  ?HYDROcodone-acetaminophen (NORCO/VICODIN) 5-325 MG tablet Take 2 tablets by mouth every 4 (four) hours as needed for up to 5 days. 05/10/21 05/15/21 Yes Malaiyah Achorn, Adora Fridge, PA-C  ?Ibuprofen-Acetaminophen (ADVIL DUAL ACTION) 125-250 MG TABS Take 250-500 mg by mouth daily as needed (pain).   Yes [provider]  ?ciprofloxacin (CIPRO) 250 MG tablet Take 250 mg by mouth See admin instructions. Bid x 3 days ?Patient not taking: Reported on 05/10/2021 04/25/21   [provider]  ?lidocaine (LIDODERM) 5 % Place 1 patch onto the skin daily. Remove & Discard patch within 12 hours or as directed by MD ?Patient not taking: Reported on 05/10/2021 05/01/21   Jonetta Osgood, MD  ?   ? ?Allergies    ?Patient has no known allergies.   ? ?Review of Systems   ?Review of Systems  ?Constitutional:  Negative for chills and fever.  ?Eyes:  Negative for visual disturbance.  ?Respiratory:  Negative for cough and shortness  of breath.   ?Cardiovascular:  Negative for chest pain and leg swelling.  ?Gastrointestinal:  Negative for abdominal pain, nausea and vomiting.  ?Genitourinary:  Positive for flank pain. Negative for dysuria and hematuria.  ?Musculoskeletal:  Positive for back pain. Negative for arthralgias, gait problem and neck pain.  ?Skin:  Negative for wound.  ?Neurological:  Negative for dizziness, syncope, facial asymmetry, speech difficulty, weakness, numbness and headaches.  ?Psychiatric/Behavioral:   Negative for confusion.   ?All other systems reviewed and are negative. ? ?Physical Exam ?Updated Vital Signs ?BP (!) 177/68   Pulse 65   Temp 98.2 ?F (36.8 ?C) (Oral)   Resp 16   Ht $R'5\' 4"'aX$  (1.626 m)   Wt 56.5 kg   LMP  (LMP Unknown)   SpO2 93%   BMI 21.38 kg/m?  ?Physical Exam ?Vitals and nursing note reviewed.  ?Constitutional:   ?   General: She is not in acute distress. ?   Appearance: Normal appearance. She is not ill-appearing, toxic-appearing or diaphoretic.  ?HENT:  ?   Head: Normocephalic and atraumatic.  ?   Nose: No nasal deformity.  ?   Mouth/Throat:  ?   Lips: Pink. No lesions.  ?   Mouth: Mucous membranes are moist. No injury, lacerations, oral lesions or angioedema.  ?   Pharynx: Oropharynx is clear. Uvula midline. No pharyngeal swelling, oropharyngeal exudate, posterior oropharyngeal erythema or uvula swelling.  ?Eyes:  ?   General: Gaze aligned appropriately. No scleral icterus.    ?   Right eye: No discharge.     ?   Left eye: No discharge.  ?   Conjunctiva/sclera: Conjunctivae normal.  ?   Right eye: Right conjunctiva is not injected. No exudate or hemorrhage. ?   Left eye: Left conjunctiva is not injected. No exudate or hemorrhage. ?   Pupils: Pupils are equal, round, and reactive to light.  ?Cardiovascular:  ?   Rate and Rhythm: Normal rate and regular rhythm.  ?   Pulses: Normal pulses.     ?     Radial pulses are 2+ on the right side and 2+ on the left side.  ?     Dorsalis pedis pulses are 2+ on the right side and 2+ on the left side.  ?   Heart sounds: Normal heart sounds, S1 normal and S2 normal. Heart sounds not distant. No murmur heard. ?  No friction rub. No gallop. No S3 or S4 sounds.  ?Pulmonary:  ?   Effort: Pulmonary effort is normal. No accessory muscle usage or respiratory distress.  ?   Breath sounds: Normal breath sounds. No stridor. No wheezing, rhonchi or rales.  ?Chest:  ?   Chest wall: No tenderness.  ?Abdominal:  ?   General: Abdomen is flat. Bowel sounds are normal.  There is no distension.  ?   Palpations: Abdomen is soft. There is no mass or pulsatile mass.  ?   Tenderness: There is no abdominal tenderness. There is right CVA tenderness. There is no left CVA tenderness, guarding or rebound.  ?Musculoskeletal:  ?   Right lower leg: No edema.  ?   Left lower leg: No edema.  ?Skin: ?   General: Skin is warm and dry.  ?   Coloration: Skin is not jaundiced or pale.  ?   Findings: No bruising, erythema, lesion or rash.  ?Neurological:  ?   General: No focal deficit present.  ?   Mental Status: She is alert and oriented to  person, place, and time. Mental status is at baseline.  ?   GCS: GCS eye subscore is 4. GCS verbal subscore is 5. GCS motor subscore is 6.  ?Psychiatric:     ?   Mood and Affect: Mood normal.     ?   Behavior: Behavior normal. Behavior is cooperative.  ? ? ?ED Results / Procedures / Treatments   ?Labs ?(all labs ordered are listed, but only abnormal results are displayed) ?Labs Reviewed  ?CBC WITH DIFFERENTIAL/PLATELET - Abnormal; Notable for the following components:  ?    Result Value  ? RBC 3.04 (*)   ? Hemoglobin 9.5 (*)   ? HCT 30.0 (*)   ? All other components within normal limits  ?COMPREHENSIVE METABOLIC PANEL - Abnormal; Notable for the following components:  ? Chloride 97 (*)   ? Creatinine, Ser 3.95 (*)   ? Total Protein 5.9 (*)   ? Albumin 3.1 (*)   ? AST 13 (*)   ? GFR, Estimated 12 (*)   ? All other components within normal limits  ?URINALYSIS, ROUTINE W REFLEX MICROSCOPIC - Abnormal; Notable for the following components:  ? APPearance HAZY (*)   ? pH 9.0 (*)   ? Protein, ur >=300 (*)   ? Leukocytes,Ua MODERATE (*)   ? Bacteria, UA RARE (*)   ? Non Squamous Epithelial 0-5 (*)   ? All other components within normal limits  ?URINE CULTURE  ?LIPASE, BLOOD  ? ? ?EKG ?None ? ?Radiology ?CT Renal Stone Study ? ?Result Date: 05/10/2021 ?CLINICAL DATA:  Right flank pain, kidney stone suspected. EXAM: CT ABDOMEN AND PELVIS WITHOUT CONTRAST TECHNIQUE:  Multidetector CT imaging of the abdomen and pelvis was performed following the standard protocol without IV contrast. RADIATION DOSE REDUCTION: This exam was performed according to the departmental dose-optimization program

## 2021-05-12 LAB — URINE CULTURE

## 2021-05-13 DIAGNOSIS — N2581 Secondary hyperparathyroidism of renal origin: Secondary | ICD-10-CM | POA: Diagnosis not present

## 2021-05-13 DIAGNOSIS — Z992 Dependence on renal dialysis: Secondary | ICD-10-CM | POA: Diagnosis not present

## 2021-05-13 DIAGNOSIS — D509 Iron deficiency anemia, unspecified: Secondary | ICD-10-CM | POA: Diagnosis not present

## 2021-05-13 DIAGNOSIS — N189 Chronic kidney disease, unspecified: Secondary | ICD-10-CM | POA: Diagnosis not present

## 2021-05-13 DIAGNOSIS — R110 Nausea: Secondary | ICD-10-CM | POA: Diagnosis not present

## 2021-05-13 DIAGNOSIS — N186 End stage renal disease: Secondary | ICD-10-CM | POA: Diagnosis not present

## 2021-05-14 ENCOUNTER — Ambulatory Visit: Payer: Medicare Other | Admitting: Hematology and Oncology

## 2021-05-15 DIAGNOSIS — D509 Iron deficiency anemia, unspecified: Secondary | ICD-10-CM | POA: Diagnosis not present

## 2021-05-15 DIAGNOSIS — Z992 Dependence on renal dialysis: Secondary | ICD-10-CM | POA: Diagnosis not present

## 2021-05-15 DIAGNOSIS — N186 End stage renal disease: Secondary | ICD-10-CM | POA: Diagnosis not present

## 2021-05-15 DIAGNOSIS — R110 Nausea: Secondary | ICD-10-CM | POA: Diagnosis not present

## 2021-05-15 DIAGNOSIS — N2581 Secondary hyperparathyroidism of renal origin: Secondary | ICD-10-CM | POA: Diagnosis not present

## 2021-05-15 DIAGNOSIS — N189 Chronic kidney disease, unspecified: Secondary | ICD-10-CM | POA: Diagnosis not present

## 2021-05-17 DIAGNOSIS — D509 Iron deficiency anemia, unspecified: Secondary | ICD-10-CM | POA: Diagnosis not present

## 2021-05-17 DIAGNOSIS — N186 End stage renal disease: Secondary | ICD-10-CM | POA: Diagnosis not present

## 2021-05-17 DIAGNOSIS — N189 Chronic kidney disease, unspecified: Secondary | ICD-10-CM | POA: Diagnosis not present

## 2021-05-17 DIAGNOSIS — N2581 Secondary hyperparathyroidism of renal origin: Secondary | ICD-10-CM | POA: Diagnosis not present

## 2021-05-17 DIAGNOSIS — R110 Nausea: Secondary | ICD-10-CM | POA: Diagnosis not present

## 2021-05-17 DIAGNOSIS — Z992 Dependence on renal dialysis: Secondary | ICD-10-CM | POA: Diagnosis not present

## 2021-05-20 DIAGNOSIS — R110 Nausea: Secondary | ICD-10-CM | POA: Diagnosis not present

## 2021-05-20 DIAGNOSIS — N189 Chronic kidney disease, unspecified: Secondary | ICD-10-CM | POA: Diagnosis not present

## 2021-05-20 DIAGNOSIS — Z992 Dependence on renal dialysis: Secondary | ICD-10-CM | POA: Diagnosis not present

## 2021-05-20 DIAGNOSIS — N186 End stage renal disease: Secondary | ICD-10-CM | POA: Diagnosis not present

## 2021-05-20 DIAGNOSIS — N2581 Secondary hyperparathyroidism of renal origin: Secondary | ICD-10-CM | POA: Diagnosis not present

## 2021-05-20 DIAGNOSIS — D509 Iron deficiency anemia, unspecified: Secondary | ICD-10-CM | POA: Diagnosis not present

## 2021-05-21 ENCOUNTER — Other Ambulatory Visit (HOSPITAL_COMMUNITY): Payer: Self-pay

## 2021-05-22 DIAGNOSIS — Z992 Dependence on renal dialysis: Secondary | ICD-10-CM | POA: Diagnosis not present

## 2021-05-22 DIAGNOSIS — D509 Iron deficiency anemia, unspecified: Secondary | ICD-10-CM | POA: Diagnosis not present

## 2021-05-22 DIAGNOSIS — N186 End stage renal disease: Secondary | ICD-10-CM | POA: Diagnosis not present

## 2021-05-22 DIAGNOSIS — N189 Chronic kidney disease, unspecified: Secondary | ICD-10-CM | POA: Diagnosis not present

## 2021-05-22 DIAGNOSIS — R110 Nausea: Secondary | ICD-10-CM | POA: Diagnosis not present

## 2021-05-22 DIAGNOSIS — N2581 Secondary hyperparathyroidism of renal origin: Secondary | ICD-10-CM | POA: Diagnosis not present

## 2021-05-22 NOTE — Progress Notes (Signed)
? ?Patient Care Team: ?Dewey, Elizabeth R, MD as PCP - General (Family Medicine) ?Gross, Steven, MD as Consulting Physician (General Surgery) ?Collins, Robert, MD as Consulting Physician (Orthopedic Surgery) ?Patel, Jay, MD as Consulting Physician (Nephrology) ?, , MD as Consulting Physician (Hematology and Oncology) ?Center, [facility] ? ?DIAGNOSIS:  ?Encounter Diagnosis  ?Name Primary?  ? Normocytic anemia   ? ?  ? ?CHIEF COMPLIANT: Follow-up of anemia of chronic kidney disease ?  ? ?INTERVAL HISTORY: Bailey [name] is a 71 y.o. with above-mentioned history of anemia of chronic kidney disease and B12 deficiency who is currently being managed by nephrology with erythropoietin stimulating agents along with iron infusions periodically. She presents to the clinic for a follow-up.  We have not seen her in a long time.  She has not received any B12 injections in a while.  She tells me that she is tolerating dialysis fairly well.  She goes to 3 times a week. ? ? ?ALLERGIES:  has No Known Allergies. ? ?MEDICATIONS:  ?Current Outpatient Medications  ?Medication Sig Dispense Refill  ? acetaminophen (TYLENOL) 500 MG tablet Take 1,000 mg by mouth every 6 (six) hours as needed for fever or headache (pain).     ? amLODipine (NORVASC) 5 MG tablet Take 1/2 tablet (2.5 mg total) by mouth at bedtime. 30 tablet 3  ? AURYXIA 1 GM 210 MG(Fe) tablet Take 210 mg by mouth 3 (three) times daily.    ? carvedilol (COREG) 3.125 MG tablet Take 1 tablet (3.125 mg total) by mouth 2 (two) times daily with a meal. (Patient taking differently: Take 6.25 mg by mouth 2 (two) times daily with a meal.) 60 tablet 1  ? ciprofloxacin (CIPRO) 250 MG tablet Take 250 mg by mouth See admin instructions. Bid x 3 days (Patient not taking: Reported on 05/10/2021)    ? Ibuprofen-Acetaminophen (ADVIL DUAL ACTION) 125-250 MG TABS Take 250-500 mg by mouth daily as needed (pain).    ? lidocaine (LIDODERM) 5 % Place 1 patch onto the skin daily.  Remove & Discard patch within 12 hours or as directed by MD (Patient not taking: Reported on 05/10/2021) 30 patch 0  ? ?No current facility-administered medications for this visit.  ? ? ?PHYSICAL EXAMINATION: ?ECOG PERFORMANCE STATUS: 2 - Symptomatic, <50% confined to bed ? ?Vitals:  ? 05/23/21 1115  ?BP: (!) 180/58  ?Pulse: 69  ?Resp: 16  ?Temp: 97.7 ?F (36.5 ?C)  ?SpO2: 100%  ? ?Filed Weights  ? 05/23/21 1115  ?Weight: 122 lb 1.6 oz (55.4 kg)  ? ?  ? ?LABORATORY DATA:  ?I have reviewed the data as listed ? ?  Latest Ref Rng & Units 05/10/2021  ?  2:15 AM 04/29/2021  ?  1:37 AM 04/28/2021  ?  2:01 AM  ?CMP  ?Glucose 70 - 99 mg/dL 99   101   90    ?BUN 8 - 23 mg/dL 16   29   22    ?Creatinine 0.44 - 1.00 mg/dL 3.95   5.70   4.39    ?Sodium 135 - 145 mmol/L 137   132   134    ?Potassium 3.5 - 5.1 mmol/L 3.7   4.3   4.2    ?Chloride 98 - 111 mmol/L 97   89   92    ?CO2 22 - 32 mmol/L 30   30   32    ?Calcium 8.9 - 10.3 mg/dL 9.0   8.6   8.6    ?Total   Protein 6.5 - 8.1 g/dL 5.9      ?Total Bilirubin 0.3 - 1.2 mg/dL 0.4      ?Alkaline Phos 38 - 126 U/L 49      ?AST 15 - 41 U/L 13      ?ALT 0 - 44 U/L 9      ? ? ?Lab Results  ?Component Value Date  ? WBC 8.4 05/10/2021  ? HGB 9.5 (L) 05/10/2021  ? HCT 30.0 (L) 05/10/2021  ? MCV 98.7 05/10/2021  ? PLT 162 05/10/2021  ? NEUTROABS 4.9 05/10/2021  ? ? ?ASSESSMENT & PLAN:  ?Normocytic anemia ?04/25/2018: Hemoglobin 9.8, MCV 89, platelets 129 ?08/09/2018: Hemoglobin 9.8, MCV 90.4, platelets 142 ?11/16/2018: Hemoglobin 7, MCV 94.7, platelets 188 ?01/02/2019: Hemoglobin 8.4, MCV 91.8, platelets 93 ?01/30/2019: Hemoglobin 11.8, platelets 125 ?02/13/2019: Hemoglobin 11.6, platelets 113 ?10/18/2019: Hemoglobin 11.3, platelets 127 ?  ?Bone marrow biopsy: 01/20/2019: Hypercellular bone marrow 50 to 60% for age with trilineage hematopoiesis, several small lymphoid aggregates present.  Mild dyspoietic changes involving the red cells.  No increase in blasts.  Confirmed that these changes are related  to chronic kidney disease.  Flow cytometry: Negative, platelet clumping noted suggestive of pseudothrombocytopenia. ?  ?Current treatment:  ?Patient will follow with nephrology for her anemia needs going further.  She gets iron infusions and Procrit injections through them. ? ?  ? ?Hospitalization 04/26/2021-04/30/2021 pyelonephritis ? ?Return to clinic on an as-needed basis ? ? ?No orders of the defined types were placed in this encounter. ? ?The patient has a good understanding of the overall plan. she agrees with it. she will call with any problems that may develop before the next visit here. ?Total time spent: 30 mins including face to face time and time spent for planning, charting and co-ordination of care ? ? Harriette Ohara, MD ?05/23/21 ? ? ? I Gardiner Coins am scribing for Dr. Lindi Adie ? ?I have reviewed the above documentation for accuracy and completeness, and I agree with the above. ?. ?

## 2021-05-23 ENCOUNTER — Inpatient Hospital Stay: Payer: Medicare Other | Attending: Hematology and Oncology | Admitting: Hematology and Oncology

## 2021-05-23 ENCOUNTER — Other Ambulatory Visit: Payer: Self-pay

## 2021-05-23 DIAGNOSIS — D649 Anemia, unspecified: Secondary | ICD-10-CM

## 2021-05-23 DIAGNOSIS — Z992 Dependence on renal dialysis: Secondary | ICD-10-CM | POA: Insufficient documentation

## 2021-05-23 DIAGNOSIS — D631 Anemia in chronic kidney disease: Secondary | ICD-10-CM | POA: Insufficient documentation

## 2021-05-23 DIAGNOSIS — N186 End stage renal disease: Secondary | ICD-10-CM | POA: Diagnosis not present

## 2021-05-23 NOTE — Assessment & Plan Note (Signed)
04/25/2018: Hemoglobin 9.8, MCV 89, platelets 129 ?08/09/2018: Hemoglobin 9.8, MCV 90.4, platelets 142 ?11/16/2018:?Hemoglobin 7, MCV 94.7, platelets 188 ?01/02/2019: Hemoglobin 8.4, MCV 91.8, platelets 93 ?01/30/2019: Hemoglobin 11.8, platelets 125 ?02/13/2019: Hemoglobin 11.6, platelets 113 ?10/18/2019: Hemoglobin 11.3, platelets 127 ?? ?Bone marrow biopsy: 01/20/2019: Hypercellular bone marrow 50 to 60% for age with trilineage hematopoiesis, several small lymphoid aggregates present. ?Mild dyspoietic changes involving the red cells. ?No increase in blasts. ?Confirmed that these changes are related to chronic kidney disease. ?Flow cytometry: Negative, platelet clumping noted suggestive of pseudothrombocytopenia. ?? ?Current treatment:? ?1.?B12 injections monthly,?began in 05/2017 ?2.??Retacrit injections?every 4 weeks if hemoglobin drops below 10, began in 10/2017 ? ?Patient was lost to follow-up and was being followed by nephrology. ? ?Hospitalization 04/26/2021-04/30/2021 pyelonephritis ? ? ?

## 2021-05-24 DIAGNOSIS — N189 Chronic kidney disease, unspecified: Secondary | ICD-10-CM | POA: Diagnosis not present

## 2021-05-24 DIAGNOSIS — D631 Anemia in chronic kidney disease: Secondary | ICD-10-CM | POA: Diagnosis not present

## 2021-05-24 DIAGNOSIS — E1129 Type 2 diabetes mellitus with other diabetic kidney complication: Secondary | ICD-10-CM | POA: Diagnosis not present

## 2021-05-24 DIAGNOSIS — D509 Iron deficiency anemia, unspecified: Secondary | ICD-10-CM | POA: Diagnosis not present

## 2021-05-24 DIAGNOSIS — Z992 Dependence on renal dialysis: Secondary | ICD-10-CM | POA: Diagnosis not present

## 2021-05-24 DIAGNOSIS — E1122 Type 2 diabetes mellitus with diabetic chronic kidney disease: Secondary | ICD-10-CM | POA: Diagnosis not present

## 2021-05-24 DIAGNOSIS — N2581 Secondary hyperparathyroidism of renal origin: Secondary | ICD-10-CM | POA: Diagnosis not present

## 2021-05-24 DIAGNOSIS — N186 End stage renal disease: Secondary | ICD-10-CM | POA: Diagnosis not present

## 2021-05-27 DIAGNOSIS — D509 Iron deficiency anemia, unspecified: Secondary | ICD-10-CM | POA: Diagnosis not present

## 2021-05-27 DIAGNOSIS — N189 Chronic kidney disease, unspecified: Secondary | ICD-10-CM | POA: Diagnosis not present

## 2021-05-27 DIAGNOSIS — N2581 Secondary hyperparathyroidism of renal origin: Secondary | ICD-10-CM | POA: Diagnosis not present

## 2021-05-27 DIAGNOSIS — E1122 Type 2 diabetes mellitus with diabetic chronic kidney disease: Secondary | ICD-10-CM | POA: Diagnosis not present

## 2021-05-27 DIAGNOSIS — N186 End stage renal disease: Secondary | ICD-10-CM | POA: Diagnosis not present

## 2021-05-27 DIAGNOSIS — Z992 Dependence on renal dialysis: Secondary | ICD-10-CM | POA: Diagnosis not present

## 2021-05-29 DIAGNOSIS — N2581 Secondary hyperparathyroidism of renal origin: Secondary | ICD-10-CM | POA: Diagnosis not present

## 2021-05-29 DIAGNOSIS — D509 Iron deficiency anemia, unspecified: Secondary | ICD-10-CM | POA: Diagnosis not present

## 2021-05-29 DIAGNOSIS — N189 Chronic kidney disease, unspecified: Secondary | ICD-10-CM | POA: Diagnosis not present

## 2021-05-29 DIAGNOSIS — Z992 Dependence on renal dialysis: Secondary | ICD-10-CM | POA: Diagnosis not present

## 2021-05-29 DIAGNOSIS — N186 End stage renal disease: Secondary | ICD-10-CM | POA: Diagnosis not present

## 2021-05-29 DIAGNOSIS — E1122 Type 2 diabetes mellitus with diabetic chronic kidney disease: Secondary | ICD-10-CM | POA: Diagnosis not present

## 2021-05-31 DIAGNOSIS — N2581 Secondary hyperparathyroidism of renal origin: Secondary | ICD-10-CM | POA: Diagnosis not present

## 2021-05-31 DIAGNOSIS — N189 Chronic kidney disease, unspecified: Secondary | ICD-10-CM | POA: Diagnosis not present

## 2021-05-31 DIAGNOSIS — Z992 Dependence on renal dialysis: Secondary | ICD-10-CM | POA: Diagnosis not present

## 2021-05-31 DIAGNOSIS — D509 Iron deficiency anemia, unspecified: Secondary | ICD-10-CM | POA: Diagnosis not present

## 2021-05-31 DIAGNOSIS — N186 End stage renal disease: Secondary | ICD-10-CM | POA: Diagnosis not present

## 2021-05-31 DIAGNOSIS — E1122 Type 2 diabetes mellitus with diabetic chronic kidney disease: Secondary | ICD-10-CM | POA: Diagnosis not present

## 2021-06-03 DIAGNOSIS — N189 Chronic kidney disease, unspecified: Secondary | ICD-10-CM | POA: Diagnosis not present

## 2021-06-03 DIAGNOSIS — Z992 Dependence on renal dialysis: Secondary | ICD-10-CM | POA: Diagnosis not present

## 2021-06-03 DIAGNOSIS — N2581 Secondary hyperparathyroidism of renal origin: Secondary | ICD-10-CM | POA: Diagnosis not present

## 2021-06-03 DIAGNOSIS — N186 End stage renal disease: Secondary | ICD-10-CM | POA: Diagnosis not present

## 2021-06-03 DIAGNOSIS — D509 Iron deficiency anemia, unspecified: Secondary | ICD-10-CM | POA: Diagnosis not present

## 2021-06-03 DIAGNOSIS — E1122 Type 2 diabetes mellitus with diabetic chronic kidney disease: Secondary | ICD-10-CM | POA: Diagnosis not present

## 2021-06-05 DIAGNOSIS — N186 End stage renal disease: Secondary | ICD-10-CM | POA: Diagnosis not present

## 2021-06-05 DIAGNOSIS — N2581 Secondary hyperparathyroidism of renal origin: Secondary | ICD-10-CM | POA: Diagnosis not present

## 2021-06-05 DIAGNOSIS — Z992 Dependence on renal dialysis: Secondary | ICD-10-CM | POA: Diagnosis not present

## 2021-06-05 DIAGNOSIS — D509 Iron deficiency anemia, unspecified: Secondary | ICD-10-CM | POA: Diagnosis not present

## 2021-06-05 DIAGNOSIS — E1122 Type 2 diabetes mellitus with diabetic chronic kidney disease: Secondary | ICD-10-CM | POA: Diagnosis not present

## 2021-06-05 DIAGNOSIS — N189 Chronic kidney disease, unspecified: Secondary | ICD-10-CM | POA: Diagnosis not present

## 2021-06-07 DIAGNOSIS — Z992 Dependence on renal dialysis: Secondary | ICD-10-CM | POA: Diagnosis not present

## 2021-06-07 DIAGNOSIS — D509 Iron deficiency anemia, unspecified: Secondary | ICD-10-CM | POA: Diagnosis not present

## 2021-06-07 DIAGNOSIS — N189 Chronic kidney disease, unspecified: Secondary | ICD-10-CM | POA: Diagnosis not present

## 2021-06-07 DIAGNOSIS — N2581 Secondary hyperparathyroidism of renal origin: Secondary | ICD-10-CM | POA: Diagnosis not present

## 2021-06-07 DIAGNOSIS — N186 End stage renal disease: Secondary | ICD-10-CM | POA: Diagnosis not present

## 2021-06-07 DIAGNOSIS — E1122 Type 2 diabetes mellitus with diabetic chronic kidney disease: Secondary | ICD-10-CM | POA: Diagnosis not present

## 2021-06-10 DIAGNOSIS — N2581 Secondary hyperparathyroidism of renal origin: Secondary | ICD-10-CM | POA: Diagnosis not present

## 2021-06-10 DIAGNOSIS — N189 Chronic kidney disease, unspecified: Secondary | ICD-10-CM | POA: Diagnosis not present

## 2021-06-10 DIAGNOSIS — Z992 Dependence on renal dialysis: Secondary | ICD-10-CM | POA: Diagnosis not present

## 2021-06-10 DIAGNOSIS — N186 End stage renal disease: Secondary | ICD-10-CM | POA: Diagnosis not present

## 2021-06-10 DIAGNOSIS — D509 Iron deficiency anemia, unspecified: Secondary | ICD-10-CM | POA: Diagnosis not present

## 2021-06-10 DIAGNOSIS — E1122 Type 2 diabetes mellitus with diabetic chronic kidney disease: Secondary | ICD-10-CM | POA: Diagnosis not present

## 2021-06-12 DIAGNOSIS — Z992 Dependence on renal dialysis: Secondary | ICD-10-CM | POA: Diagnosis not present

## 2021-06-12 DIAGNOSIS — N189 Chronic kidney disease, unspecified: Secondary | ICD-10-CM | POA: Diagnosis not present

## 2021-06-12 DIAGNOSIS — E1122 Type 2 diabetes mellitus with diabetic chronic kidney disease: Secondary | ICD-10-CM | POA: Diagnosis not present

## 2021-06-12 DIAGNOSIS — N2581 Secondary hyperparathyroidism of renal origin: Secondary | ICD-10-CM | POA: Diagnosis not present

## 2021-06-12 DIAGNOSIS — D509 Iron deficiency anemia, unspecified: Secondary | ICD-10-CM | POA: Diagnosis not present

## 2021-06-12 DIAGNOSIS — N186 End stage renal disease: Secondary | ICD-10-CM | POA: Diagnosis not present

## 2021-06-14 DIAGNOSIS — N189 Chronic kidney disease, unspecified: Secondary | ICD-10-CM | POA: Diagnosis not present

## 2021-06-14 DIAGNOSIS — E1122 Type 2 diabetes mellitus with diabetic chronic kidney disease: Secondary | ICD-10-CM | POA: Diagnosis not present

## 2021-06-14 DIAGNOSIS — N2581 Secondary hyperparathyroidism of renal origin: Secondary | ICD-10-CM | POA: Diagnosis not present

## 2021-06-14 DIAGNOSIS — N186 End stage renal disease: Secondary | ICD-10-CM | POA: Diagnosis not present

## 2021-06-14 DIAGNOSIS — D509 Iron deficiency anemia, unspecified: Secondary | ICD-10-CM | POA: Diagnosis not present

## 2021-06-14 DIAGNOSIS — Z992 Dependence on renal dialysis: Secondary | ICD-10-CM | POA: Diagnosis not present

## 2021-06-17 DIAGNOSIS — D509 Iron deficiency anemia, unspecified: Secondary | ICD-10-CM | POA: Diagnosis not present

## 2021-06-17 DIAGNOSIS — E1122 Type 2 diabetes mellitus with diabetic chronic kidney disease: Secondary | ICD-10-CM | POA: Diagnosis not present

## 2021-06-17 DIAGNOSIS — N186 End stage renal disease: Secondary | ICD-10-CM | POA: Diagnosis not present

## 2021-06-17 DIAGNOSIS — Z992 Dependence on renal dialysis: Secondary | ICD-10-CM | POA: Diagnosis not present

## 2021-06-17 DIAGNOSIS — N2581 Secondary hyperparathyroidism of renal origin: Secondary | ICD-10-CM | POA: Diagnosis not present

## 2021-06-17 DIAGNOSIS — N189 Chronic kidney disease, unspecified: Secondary | ICD-10-CM | POA: Diagnosis not present

## 2021-06-19 DIAGNOSIS — N189 Chronic kidney disease, unspecified: Secondary | ICD-10-CM | POA: Diagnosis not present

## 2021-06-19 DIAGNOSIS — E1122 Type 2 diabetes mellitus with diabetic chronic kidney disease: Secondary | ICD-10-CM | POA: Diagnosis not present

## 2021-06-19 DIAGNOSIS — N186 End stage renal disease: Secondary | ICD-10-CM | POA: Diagnosis not present

## 2021-06-19 DIAGNOSIS — Z992 Dependence on renal dialysis: Secondary | ICD-10-CM | POA: Diagnosis not present

## 2021-06-19 DIAGNOSIS — N2581 Secondary hyperparathyroidism of renal origin: Secondary | ICD-10-CM | POA: Diagnosis not present

## 2021-06-19 DIAGNOSIS — D509 Iron deficiency anemia, unspecified: Secondary | ICD-10-CM | POA: Diagnosis not present

## 2021-06-21 DIAGNOSIS — D509 Iron deficiency anemia, unspecified: Secondary | ICD-10-CM | POA: Diagnosis not present

## 2021-06-21 DIAGNOSIS — N2581 Secondary hyperparathyroidism of renal origin: Secondary | ICD-10-CM | POA: Diagnosis not present

## 2021-06-21 DIAGNOSIS — Z992 Dependence on renal dialysis: Secondary | ICD-10-CM | POA: Diagnosis not present

## 2021-06-21 DIAGNOSIS — N186 End stage renal disease: Secondary | ICD-10-CM | POA: Diagnosis not present

## 2021-06-21 DIAGNOSIS — N189 Chronic kidney disease, unspecified: Secondary | ICD-10-CM | POA: Diagnosis not present

## 2021-06-21 DIAGNOSIS — E1122 Type 2 diabetes mellitus with diabetic chronic kidney disease: Secondary | ICD-10-CM | POA: Diagnosis not present

## 2021-06-23 DIAGNOSIS — E1129 Type 2 diabetes mellitus with other diabetic kidney complication: Secondary | ICD-10-CM | POA: Diagnosis not present

## 2021-06-23 DIAGNOSIS — N186 End stage renal disease: Secondary | ICD-10-CM | POA: Diagnosis not present

## 2021-06-23 DIAGNOSIS — Z992 Dependence on renal dialysis: Secondary | ICD-10-CM | POA: Diagnosis not present

## 2021-06-24 DIAGNOSIS — N186 End stage renal disease: Secondary | ICD-10-CM | POA: Diagnosis not present

## 2021-06-24 DIAGNOSIS — D631 Anemia in chronic kidney disease: Secondary | ICD-10-CM | POA: Diagnosis not present

## 2021-06-24 DIAGNOSIS — Z992 Dependence on renal dialysis: Secondary | ICD-10-CM | POA: Diagnosis not present

## 2021-06-24 DIAGNOSIS — E1122 Type 2 diabetes mellitus with diabetic chronic kidney disease: Secondary | ICD-10-CM | POA: Diagnosis not present

## 2021-06-24 DIAGNOSIS — N189 Chronic kidney disease, unspecified: Secondary | ICD-10-CM | POA: Diagnosis not present

## 2021-06-24 DIAGNOSIS — N2581 Secondary hyperparathyroidism of renal origin: Secondary | ICD-10-CM | POA: Diagnosis not present

## 2021-06-24 DIAGNOSIS — D509 Iron deficiency anemia, unspecified: Secondary | ICD-10-CM | POA: Diagnosis not present

## 2021-06-26 DIAGNOSIS — D509 Iron deficiency anemia, unspecified: Secondary | ICD-10-CM | POA: Diagnosis not present

## 2021-06-26 DIAGNOSIS — N2581 Secondary hyperparathyroidism of renal origin: Secondary | ICD-10-CM | POA: Diagnosis not present

## 2021-06-26 DIAGNOSIS — N186 End stage renal disease: Secondary | ICD-10-CM | POA: Diagnosis not present

## 2021-06-26 DIAGNOSIS — D631 Anemia in chronic kidney disease: Secondary | ICD-10-CM | POA: Diagnosis not present

## 2021-06-26 DIAGNOSIS — N189 Chronic kidney disease, unspecified: Secondary | ICD-10-CM | POA: Diagnosis not present

## 2021-06-26 DIAGNOSIS — Z992 Dependence on renal dialysis: Secondary | ICD-10-CM | POA: Diagnosis not present

## 2021-06-28 DIAGNOSIS — D509 Iron deficiency anemia, unspecified: Secondary | ICD-10-CM | POA: Diagnosis not present

## 2021-06-28 DIAGNOSIS — N186 End stage renal disease: Secondary | ICD-10-CM | POA: Diagnosis not present

## 2021-06-28 DIAGNOSIS — D631 Anemia in chronic kidney disease: Secondary | ICD-10-CM | POA: Diagnosis not present

## 2021-06-28 DIAGNOSIS — Z992 Dependence on renal dialysis: Secondary | ICD-10-CM | POA: Diagnosis not present

## 2021-06-28 DIAGNOSIS — N189 Chronic kidney disease, unspecified: Secondary | ICD-10-CM | POA: Diagnosis not present

## 2021-06-28 DIAGNOSIS — N2581 Secondary hyperparathyroidism of renal origin: Secondary | ICD-10-CM | POA: Diagnosis not present

## 2021-07-01 DIAGNOSIS — Z992 Dependence on renal dialysis: Secondary | ICD-10-CM | POA: Diagnosis not present

## 2021-07-01 DIAGNOSIS — N189 Chronic kidney disease, unspecified: Secondary | ICD-10-CM | POA: Diagnosis not present

## 2021-07-01 DIAGNOSIS — N186 End stage renal disease: Secondary | ICD-10-CM | POA: Diagnosis not present

## 2021-07-01 DIAGNOSIS — N2581 Secondary hyperparathyroidism of renal origin: Secondary | ICD-10-CM | POA: Diagnosis not present

## 2021-07-01 DIAGNOSIS — D631 Anemia in chronic kidney disease: Secondary | ICD-10-CM | POA: Diagnosis not present

## 2021-07-01 DIAGNOSIS — D509 Iron deficiency anemia, unspecified: Secondary | ICD-10-CM | POA: Diagnosis not present

## 2021-07-03 DIAGNOSIS — N186 End stage renal disease: Secondary | ICD-10-CM | POA: Diagnosis not present

## 2021-07-03 DIAGNOSIS — N189 Chronic kidney disease, unspecified: Secondary | ICD-10-CM | POA: Diagnosis not present

## 2021-07-03 DIAGNOSIS — D631 Anemia in chronic kidney disease: Secondary | ICD-10-CM | POA: Diagnosis not present

## 2021-07-03 DIAGNOSIS — N2581 Secondary hyperparathyroidism of renal origin: Secondary | ICD-10-CM | POA: Diagnosis not present

## 2021-07-03 DIAGNOSIS — D509 Iron deficiency anemia, unspecified: Secondary | ICD-10-CM | POA: Diagnosis not present

## 2021-07-03 DIAGNOSIS — Z992 Dependence on renal dialysis: Secondary | ICD-10-CM | POA: Diagnosis not present

## 2021-07-04 ENCOUNTER — Telehealth: Payer: Self-pay

## 2021-07-04 NOTE — Telephone Encounter (Signed)
Attempted to contact patient's spouse Elenore Rota to schedule a Palliative Care consult appointment. No answer left a message to return call.  ?

## 2021-07-08 ENCOUNTER — Telehealth: Payer: Self-pay

## 2021-07-08 DIAGNOSIS — N189 Chronic kidney disease, unspecified: Secondary | ICD-10-CM | POA: Diagnosis not present

## 2021-07-08 DIAGNOSIS — D631 Anemia in chronic kidney disease: Secondary | ICD-10-CM | POA: Diagnosis not present

## 2021-07-08 DIAGNOSIS — N2581 Secondary hyperparathyroidism of renal origin: Secondary | ICD-10-CM | POA: Diagnosis not present

## 2021-07-08 DIAGNOSIS — D509 Iron deficiency anemia, unspecified: Secondary | ICD-10-CM | POA: Diagnosis not present

## 2021-07-08 DIAGNOSIS — N186 End stage renal disease: Secondary | ICD-10-CM | POA: Diagnosis not present

## 2021-07-08 DIAGNOSIS — Z992 Dependence on renal dialysis: Secondary | ICD-10-CM | POA: Diagnosis not present

## 2021-07-08 NOTE — Telephone Encounter (Signed)
Attempted to contact patient and patient's spouse Elenore Rota to schedule a Palliative Care consult appointment. No answer left a message to return call.  ?

## 2021-07-10 DIAGNOSIS — N186 End stage renal disease: Secondary | ICD-10-CM | POA: Diagnosis not present

## 2021-07-10 DIAGNOSIS — N189 Chronic kidney disease, unspecified: Secondary | ICD-10-CM | POA: Diagnosis not present

## 2021-07-10 DIAGNOSIS — D509 Iron deficiency anemia, unspecified: Secondary | ICD-10-CM | POA: Diagnosis not present

## 2021-07-10 DIAGNOSIS — Z992 Dependence on renal dialysis: Secondary | ICD-10-CM | POA: Diagnosis not present

## 2021-07-10 DIAGNOSIS — N2581 Secondary hyperparathyroidism of renal origin: Secondary | ICD-10-CM | POA: Diagnosis not present

## 2021-07-10 DIAGNOSIS — D631 Anemia in chronic kidney disease: Secondary | ICD-10-CM | POA: Diagnosis not present

## 2021-07-12 DIAGNOSIS — N2581 Secondary hyperparathyroidism of renal origin: Secondary | ICD-10-CM | POA: Diagnosis not present

## 2021-07-12 DIAGNOSIS — N189 Chronic kidney disease, unspecified: Secondary | ICD-10-CM | POA: Diagnosis not present

## 2021-07-12 DIAGNOSIS — D509 Iron deficiency anemia, unspecified: Secondary | ICD-10-CM | POA: Diagnosis not present

## 2021-07-12 DIAGNOSIS — N186 End stage renal disease: Secondary | ICD-10-CM | POA: Diagnosis not present

## 2021-07-12 DIAGNOSIS — D631 Anemia in chronic kidney disease: Secondary | ICD-10-CM | POA: Diagnosis not present

## 2021-07-12 DIAGNOSIS — Z992 Dependence on renal dialysis: Secondary | ICD-10-CM | POA: Diagnosis not present

## 2021-07-14 ENCOUNTER — Telehealth: Payer: Self-pay

## 2021-07-14 NOTE — Telephone Encounter (Signed)
Attempted to contact patient's spouse Elenore Rota and DIL Tammy to schedule a Palliative Care consult appointment. No answer left a message to return call.

## 2021-07-15 DIAGNOSIS — Z992 Dependence on renal dialysis: Secondary | ICD-10-CM | POA: Diagnosis not present

## 2021-07-15 DIAGNOSIS — N189 Chronic kidney disease, unspecified: Secondary | ICD-10-CM | POA: Diagnosis not present

## 2021-07-15 DIAGNOSIS — N186 End stage renal disease: Secondary | ICD-10-CM | POA: Diagnosis not present

## 2021-07-15 DIAGNOSIS — N2581 Secondary hyperparathyroidism of renal origin: Secondary | ICD-10-CM | POA: Diagnosis not present

## 2021-07-15 DIAGNOSIS — D509 Iron deficiency anemia, unspecified: Secondary | ICD-10-CM | POA: Diagnosis not present

## 2021-07-15 DIAGNOSIS — D631 Anemia in chronic kidney disease: Secondary | ICD-10-CM | POA: Diagnosis not present

## 2021-07-17 DIAGNOSIS — N186 End stage renal disease: Secondary | ICD-10-CM | POA: Diagnosis not present

## 2021-07-17 DIAGNOSIS — N189 Chronic kidney disease, unspecified: Secondary | ICD-10-CM | POA: Diagnosis not present

## 2021-07-17 DIAGNOSIS — D631 Anemia in chronic kidney disease: Secondary | ICD-10-CM | POA: Diagnosis not present

## 2021-07-17 DIAGNOSIS — Z992 Dependence on renal dialysis: Secondary | ICD-10-CM | POA: Diagnosis not present

## 2021-07-17 DIAGNOSIS — D509 Iron deficiency anemia, unspecified: Secondary | ICD-10-CM | POA: Diagnosis not present

## 2021-07-17 DIAGNOSIS — N2581 Secondary hyperparathyroidism of renal origin: Secondary | ICD-10-CM | POA: Diagnosis not present

## 2021-07-19 DIAGNOSIS — N186 End stage renal disease: Secondary | ICD-10-CM | POA: Diagnosis not present

## 2021-07-19 DIAGNOSIS — Z992 Dependence on renal dialysis: Secondary | ICD-10-CM | POA: Diagnosis not present

## 2021-07-19 DIAGNOSIS — N189 Chronic kidney disease, unspecified: Secondary | ICD-10-CM | POA: Diagnosis not present

## 2021-07-19 DIAGNOSIS — D509 Iron deficiency anemia, unspecified: Secondary | ICD-10-CM | POA: Diagnosis not present

## 2021-07-19 DIAGNOSIS — N2581 Secondary hyperparathyroidism of renal origin: Secondary | ICD-10-CM | POA: Diagnosis not present

## 2021-07-19 DIAGNOSIS — D631 Anemia in chronic kidney disease: Secondary | ICD-10-CM | POA: Diagnosis not present

## 2021-07-22 DIAGNOSIS — Z992 Dependence on renal dialysis: Secondary | ICD-10-CM | POA: Diagnosis not present

## 2021-07-22 DIAGNOSIS — D509 Iron deficiency anemia, unspecified: Secondary | ICD-10-CM | POA: Diagnosis not present

## 2021-07-22 DIAGNOSIS — N2581 Secondary hyperparathyroidism of renal origin: Secondary | ICD-10-CM | POA: Diagnosis not present

## 2021-07-22 DIAGNOSIS — N186 End stage renal disease: Secondary | ICD-10-CM | POA: Diagnosis not present

## 2021-07-22 DIAGNOSIS — N189 Chronic kidney disease, unspecified: Secondary | ICD-10-CM | POA: Diagnosis not present

## 2021-07-22 DIAGNOSIS — D631 Anemia in chronic kidney disease: Secondary | ICD-10-CM | POA: Diagnosis not present

## 2021-07-24 ENCOUNTER — Telehealth: Payer: Self-pay

## 2021-07-24 DIAGNOSIS — E1122 Type 2 diabetes mellitus with diabetic chronic kidney disease: Secondary | ICD-10-CM | POA: Diagnosis not present

## 2021-07-24 DIAGNOSIS — D509 Iron deficiency anemia, unspecified: Secondary | ICD-10-CM | POA: Diagnosis not present

## 2021-07-24 DIAGNOSIS — N189 Chronic kidney disease, unspecified: Secondary | ICD-10-CM | POA: Diagnosis not present

## 2021-07-24 DIAGNOSIS — D631 Anemia in chronic kidney disease: Secondary | ICD-10-CM | POA: Diagnosis not present

## 2021-07-24 DIAGNOSIS — N186 End stage renal disease: Secondary | ICD-10-CM | POA: Diagnosis not present

## 2021-07-24 DIAGNOSIS — N2581 Secondary hyperparathyroidism of renal origin: Secondary | ICD-10-CM | POA: Diagnosis not present

## 2021-07-24 DIAGNOSIS — R1110 Vomiting, unspecified: Secondary | ICD-10-CM | POA: Diagnosis not present

## 2021-07-24 DIAGNOSIS — Z992 Dependence on renal dialysis: Secondary | ICD-10-CM | POA: Diagnosis not present

## 2021-07-24 DIAGNOSIS — E1129 Type 2 diabetes mellitus with other diabetic kidney complication: Secondary | ICD-10-CM | POA: Diagnosis not present

## 2021-07-24 NOTE — Telephone Encounter (Signed)
Attempted to contact patient's spouse and daughter Bailey [name] to schedule a Palliative Care consult appointment. No answer left a message for spouse to return call. Daughter's voicemail is full. If no response by 6/2 will cancel referral.

## 2021-07-26 DIAGNOSIS — R1110 Vomiting, unspecified: Secondary | ICD-10-CM | POA: Diagnosis not present

## 2021-07-26 DIAGNOSIS — N189 Chronic kidney disease, unspecified: Secondary | ICD-10-CM | POA: Diagnosis not present

## 2021-07-26 DIAGNOSIS — N186 End stage renal disease: Secondary | ICD-10-CM | POA: Diagnosis not present

## 2021-07-26 DIAGNOSIS — N2581 Secondary hyperparathyroidism of renal origin: Secondary | ICD-10-CM | POA: Diagnosis not present

## 2021-07-26 DIAGNOSIS — Z992 Dependence on renal dialysis: Secondary | ICD-10-CM | POA: Diagnosis not present

## 2021-07-26 DIAGNOSIS — D509 Iron deficiency anemia, unspecified: Secondary | ICD-10-CM | POA: Diagnosis not present

## 2021-07-27 ENCOUNTER — Other Ambulatory Visit: Payer: Self-pay

## 2021-07-27 ENCOUNTER — Emergency Department (HOSPITAL_COMMUNITY): Payer: Medicare Other

## 2021-07-27 ENCOUNTER — Encounter (HOSPITAL_COMMUNITY): Payer: Self-pay

## 2021-07-27 ENCOUNTER — Emergency Department (HOSPITAL_COMMUNITY)
Admission: EM | Admit: 2021-07-27 | Discharge: 2021-07-27 | Disposition: A | Discharge status: Home / Self Care | Payer: Medicare Other | Attending: Emergency Medicine | Admitting: Emergency Medicine

## 2021-07-27 DIAGNOSIS — M545 Low back pain, unspecified: Secondary | ICD-10-CM | POA: Diagnosis not present

## 2021-07-27 DIAGNOSIS — R519 Headache, unspecified: Secondary | ICD-10-CM | POA: Insufficient documentation

## 2021-07-27 DIAGNOSIS — I120 Hypertensive chronic kidney disease with stage 5 chronic kidney disease or end stage renal disease: Secondary | ICD-10-CM | POA: Insufficient documentation

## 2021-07-27 DIAGNOSIS — I6381 Other cerebral infarction due to occlusion or stenosis of small artery: Secondary | ICD-10-CM | POA: Diagnosis not present

## 2021-07-27 DIAGNOSIS — M542 Cervicalgia: Secondary | ICD-10-CM | POA: Diagnosis not present

## 2021-07-27 DIAGNOSIS — M25552 Pain in left hip: Secondary | ICD-10-CM | POA: Insufficient documentation

## 2021-07-27 DIAGNOSIS — M79671 Pain in right foot: Secondary | ICD-10-CM | POA: Diagnosis not present

## 2021-07-27 DIAGNOSIS — W19XXXA Unspecified fall, initial encounter: Secondary | ICD-10-CM

## 2021-07-27 DIAGNOSIS — S65401A Unspecified injury of blood vessel of right thumb, initial encounter: Secondary | ICD-10-CM | POA: Diagnosis present

## 2021-07-27 DIAGNOSIS — S62524A Nondisplaced fracture of distal phalanx of right thumb, initial encounter for closed fracture: Secondary | ICD-10-CM | POA: Insufficient documentation

## 2021-07-27 DIAGNOSIS — Z992 Dependence on renal dialysis: Secondary | ICD-10-CM | POA: Diagnosis not present

## 2021-07-27 DIAGNOSIS — M8588 Other specified disorders of bone density and structure, other site: Secondary | ICD-10-CM | POA: Diagnosis not present

## 2021-07-27 DIAGNOSIS — W108XXA Fall (on) (from) other stairs and steps, initial encounter: Secondary | ICD-10-CM | POA: Diagnosis not present

## 2021-07-27 DIAGNOSIS — E1122 Type 2 diabetes mellitus with diabetic chronic kidney disease: Secondary | ICD-10-CM | POA: Insufficient documentation

## 2021-07-27 DIAGNOSIS — M25551 Pain in right hip: Secondary | ICD-10-CM | POA: Diagnosis not present

## 2021-07-27 DIAGNOSIS — N185 Chronic kidney disease, stage 5: Secondary | ICD-10-CM | POA: Insufficient documentation

## 2021-07-27 DIAGNOSIS — M79643 Pain in unspecified hand: Secondary | ICD-10-CM

## 2021-07-27 MED ORDER — HYDROCODONE-ACETAMINOPHEN 5-325 MG PO TABS
1.0000 | ORAL_TABLET | Freq: Once | ORAL | Status: AC
  Administered 2021-07-27: 1 via ORAL
  Filled 2021-07-27: qty 1

## 2021-07-27 NOTE — ED Triage Notes (Addendum)
Patient reports that she fell down 6 stairs yesterday at 1800. Patient states she lost her footing. Patient reports that she hit her head in the back, c/o right foot pain, left hip pain, and right thumb pain. Patient denies LOC, dizziness, or nausea. Patient denies blood thinners.

## 2021-07-27 NOTE — Progress Notes (Signed)
Orthopedic Tech Progress Note Patient Details:  Bailey [name] [birth year] [id number]  Ortho Devices Type of Ortho Device: Ace wrap, Thumb spica splint Splint Material: Fiberglass Ortho Device/Splint Location: right Ortho Device/Splint Interventions: Application   Post Interventions Patient Tolerated: Well Instructions Provided: Care of device  [place] Pink 07/27/2021, 11:26 AM

## 2021-07-27 NOTE — ED Provider Notes (Signed)
[facility] DEPT Provider Note   CSN: [id number] Arrival date & time: 07/27/21  0092     History  Chief Complaint  Patient presents with   Fall   Head Injury   Hip Pain   Foot Pain    Bailey [name] is a 71 y.o. female.  71 year old female presents with her husband for evaluation of left hip pain, right ankle pain, right hand pain following a fall that occurred around 6 PM yesterday.  Patient is a dialysis patient and had a session 1.5 hrs prior to the fall.  Patient did not lose consciousness.  She is not on anticoagulation.  She fell backwards down 6 wooden steps.  Husband witnessed the fall.  And assisted the patient.  She has been amatory since the time of the incident.  Without other complaints.   The history is provided by the patient. No language interpreter was used.      Home Medications Prior to Admission medications   Medication Sig Start Date End Date Taking? Authorizing Provider  acetaminophen (TYLENOL) 500 MG tablet Take 1,000 mg by mouth every 6 (six) hours as needed for fever or headache (pain).     [provider]  amLODipine (NORVASC) 5 MG tablet Take 1/2 tablet (2.5 mg total) by mouth at bedtime. 04/30/21   Ghimire, Henreitta Leber, MD  AURYXIA 1 GM 210 MG(Fe) tablet Take 210 mg by mouth 3 (three) times daily. 02/06/21   [provider]  carvedilol (COREG) 3.125 MG tablet Take 1 tablet (3.125 mg total) by mouth 2 (two) times daily with a meal. Patient taking differently: Take 6.25 mg by mouth 2 (two) times daily with a meal. 04/30/21   Ghimire, Henreitta Leber, MD  ciprofloxacin (CIPRO) 250 MG tablet Take 250 mg by mouth See admin instructions. Bid x 3 days Patient not taking: Reported on 05/10/2021 04/25/21   [provider]  Ibuprofen-Acetaminophen (ADVIL DUAL ACTION) 125-250 MG TABS Take 250-500 mg by mouth daily as needed (pain).    [provider]  lidocaine (LIDODERM) 5 % Place 1 patch onto the skin daily.  Remove & Discard patch within 12 hours or as directed by MD Patient not taking: Reported on 05/10/2021 05/01/21   Jonetta Osgood, MD      Allergies    Patient has no known allergies.    Review of Systems   Review of Systems  Eyes:  Negative for visual disturbance.  Musculoskeletal:  Positive for arthralgias. Negative for joint swelling.  Neurological:  Negative for syncope, weakness, light-headedness and headaches.  All other systems reviewed and are negative.  Physical Exam Updated Vital Signs BP (!) 179/68 (BP Location: Right Arm)   Pulse 67   Temp 98 F (36.7 C) (Oral)   Resp 14   Ht $R'5\' 4"'Jm$  (1.626 m)   Wt 51.7 kg   LMP  (LMP Unknown)   SpO2 97%   BMI 19.57 kg/m  Physical Exam Vitals and nursing note reviewed.  Constitutional:      General: She is not in acute distress.    Appearance: Normal appearance. She is not ill-appearing.  HENT:     Head: Normocephalic and atraumatic.     Nose: Nose normal.  Eyes:     General: No scleral icterus.    Extraocular Movements: Extraocular movements intact.     Conjunctiva/sclera: Conjunctivae normal.  Cardiovascular:     Rate and Rhythm: Normal rate and regular rhythm.     Pulses: Normal pulses.  Heart sounds: Normal heart sounds.  Pulmonary:     Effort: Pulmonary effort is normal. No respiratory distress.     Breath sounds: Normal breath sounds. No wheezing or rales.  Abdominal:     General: There is no distension.     Tenderness: There is no abdominal tenderness.  Musculoskeletal:        General: Normal range of motion.     Cervical back: Normal range of motion.     Comments: Full range of motion in bilateral upper and lower extremities and 5/5 strength in extensor and flexor muscle groups of upper and lower extremities.  Cervical, thoracic, lumbar spine without tenderness to palpation.  Head atraumatic.  Mild tenderness palpation present over the right thumb.  Right snuffbox tenderness to palpation present.  Left hip  tenderness present.  Right ankle tenderness present.  Otherwise all joints are without tenderness to palpation.  2+ DP pulse present.  Bilateral great toe amputation.  Skin:    General: Skin is warm and dry.  Neurological:     General: No focal deficit present.     Mental Status: She is alert. Mental status is at baseline.    ED Results / Procedures / Treatments   Labs (all labs ordered are listed, but only abnormal results are displayed) Labs Reviewed - No data to display  EKG None  Radiology CT Head Wo Contrast  Result Date: 07/27/2021 CLINICAL DATA:  71 year old female status post fall. Pain. EXAM: CT HEAD WITHOUT CONTRAST TECHNIQUE: Contiguous axial images were obtained from the base of the skull through the vertex without intravenous contrast. RADIATION DOSE REDUCTION: This exam was performed according to the departmental dose-optimization program which includes automated exposure control, adjustment of the mA and/or kV according to patient size and/or use of iterative reconstruction technique. COMPARISON:  Head CT 04/13/2010. FINDINGS: Brain: Generalized cerebral volume loss since 2012. Prominent lacunar infarct in the right thalamus is new since that time, age indeterminate but probably chronic (series 3, image 16). Other patchy bilateral white matter hypodensity has also progressed. No midline shift, ventriculomegaly, mass effect, evidence of mass lesion, intracranial hemorrhage or evidence of cortically based acute infarction. Vascular: Extensive Calcified atherosclerosis at the skull base. No suspicious intracranial vascular hyperdensity. Skull: No acute osseous abnormality identified. Sinuses/Orbits: Visualized paranasal sinuses and mastoids are stable and well aerated. Other: No acute orbit or scalp soft tissue injury identified. Some scalp vessel calcified atherosclerosis. IMPRESSION: 1. No acute traumatic injury identified. 2. Progressed cerebral volume loss and small vessel disease  since 2012. A right thalamic lacunar infarct is age indeterminate but probably chronic. Electronically Signed   By: Genevie Ann M.D.   On: 07/27/2021 10:33   CT Cervical Spine Wo Contrast  Result Date: 07/27/2021 CLINICAL DATA:  71 year old female status post fall. Pain. EXAM: CT CERVICAL SPINE WITHOUT CONTRAST TECHNIQUE: Multidetector CT imaging of the cervical spine was performed without intravenous contrast. Multiplanar CT image reconstructions were also generated. RADIATION DOSE REDUCTION: This exam was performed according to the departmental dose-optimization program which includes automated exposure control, adjustment of the mA and/or kV according to patient size and/or use of iterative reconstruction technique. COMPARISON:  Head CT today. FINDINGS: Alignment: Preserved cervical lordosis. Cervicothoracic junction alignment is within normal limits. Bilateral posterior element alignment is within normal limits. Skull base and vertebrae: Visualized skull base is intact. No atlanto-occipital dissociation. C1 and C2 appear intact and aligned. Generalized osteopenia. No acute osseous abnormality identified. Soft tissues and spinal canal: No prevertebral fluid or  swelling. No visible canal hematoma. Fairly extensive calcified atherosclerosis in the neck. Disc levels: Fairly capacious cervical spinal canal despite chronic disc and endplate degeneration at C5-C6 and C6-C7. Upper chest: Visible upper thoracic levels appear grossly intact. Mild respiratory motion but negative lung apices. Other: Carious posterior mandible dentition. IMPRESSION: 1. No acute traumatic injury identified in the cervical spine. 2. Osteopenia. C5-C6 and C6-C7 disc and endplate degeneration. 3. Carious posterior mandible dentition. Electronically Signed   By: Genevie Ann M.D.   On: 07/27/2021 10:36   CT Lumbar Spine Wo Contrast  Result Date: 07/27/2021 CLINICAL DATA:  71 year old female status post fall. Pain. EXAM: CT LUMBAR SPINE WITHOUT  CONTRAST TECHNIQUE: Multidetector CT imaging of the lumbar spine was performed without intravenous contrast administration. Multiplanar CT image reconstructions were also generated. RADIATION DOSE REDUCTION: This exam was performed according to the departmental dose-optimization program which includes automated exposure control, adjustment of the mA and/or kV according to patient size and/or use of iterative reconstruction technique. COMPARISON:  MRI lumbar spine 04/29/2021. CT Abdomen and Pelvis 05/10/2021. FINDINGS: Segmentation: Normal, the same numbering system used on the March MRI. Alignment: Stable lumbar lordosis. No spondylolisthesis. Vertebrae: Osteopenia. L1 benign vertebral body hemangioma. Stable lumbar vertebral height and alignment. No acute osseous abnormality identified. Visible sacrum and SI joints appear grossly intact. Paraspinal and other soft tissues: Severe generalized calcified atherosclerosis. Normal caliber abdominal aorta. Visible abdominal viscera appear stable compared to March. Lumbar paraspinal soft tissues remain within normal limits. Disc levels: Capacious lumbar spinal canal and mild for age lumbar spine degeneration appear stable since the March MRI. IMPRESSION: 1. Osteopenia. No acute osseous abnormality in the lumbar spine. 2. Capacious lumbar spinal canal and mild for age lumbar spine degeneration appear stable since a March MRI. 3. Severe diffuse calcified atherosclerosis. Aortic Atherosclerosis (ICD10-I70.0). Electronically Signed   By: Genevie Ann M.D.   On: 07/27/2021 10:40   DG Hand Complete Right  Result Date: 07/27/2021 CLINICAL DATA:  Pain after fall EXAM: RIGHT HAND - COMPLETE 3+ VIEW COMPARISON:  None Available. FINDINGS: Vascular calcifications are identified. Nondisplaced fracture through the distal first phalanx best seen on the oblique view. No other fractures. IMPRESSION: Nondisplaced fracture through the distal first phalanx. Electronically Signed   By: Dorise Bullion III M.D.   On: 07/27/2021 10:30   DG Foot Complete Right  Result Date: 07/27/2021 CLINICAL DATA:  Pain after fall EXAM: RIGHT FOOT COMPLETE - 3+ VIEW COMPARISON:  None Available. FINDINGS: Amputation of the first metatarsal and toe. Degenerative changes in the remaining toes, particularly the second toe. Vascular calcifications. No acute fractures identified. IMPRESSION: No acute fractures identified.  No dislocations. Electronically Signed   By: Dorise Bullion III M.D.   On: 07/27/2021 10:28   DG Hip Unilat W or Wo Pelvis 2-3 Views Left  Result Date: 07/27/2021 CLINICAL DATA:  Left hip pain after fall yesterday. EXAM: DG HIP (WITH OR WITHOUT PELVIS) 2-3V LEFT COMPARISON:  None Available. FINDINGS: Vascular calcifications identified in the femoral vessels. No fractures or dislocations. IMPRESSION: No fractures or dislocations in either hip. Electronically Signed   By: Dorise Bullion III M.D.   On: 07/27/2021 10:26    Procedures Procedures    Medications Ordered in ED Medications  HYDROcodone-acetaminophen (NORCO/VICODIN) 5-325 MG per tablet 1 tablet (1 tablet Oral Given 07/27/21 0944)    ED Course/ Medical Decision Making/ A&P  Medical Decision Making Amount and/or Complexity of Data Reviewed Radiology: ordered.  Risk Prescription drug management.   Medical Decision Making / ED Course   This patient presents to the ED for concern of fall, this involves an extensive number of treatment options, and is a complaint that carries with it a high risk of complications and morbidity.  The differential diagnosis includes ICH, concussion, acute fracture of the hip, ankle, or in the right hand spinal fracture  MDM: 71 year old female presents with her husband for evaluation of fall that occurred while she was climbing 6 stairs around 6 PM yesterday.  Patient did hit her head.  Denies loss of consciousness.  She is not on anticoagulation.  Without syncope.   Denied prodromal symptoms.  States it was a mechanical fall.  Exam with point tenderness over the right thumb, right anatomical snuffbox, left hip, right ankle.  Spine without tenderness to palpation.  We will provide Percocet, and order imaging.  She is without nausea, vomiting, photosensitivity.  low concern for concussion. CT head, cervical spine without acute acute intracranial or cervical spine pathology.  Lumbar spine without acute concerns.  Left hip x-ray, right ankle x-ray are without acute fractures.  There is a nondisplaced fracture at the distal first phalanx.  Given anatomical snuffbox tenderness will provide thumb spica splint that will extend to the tip of the thumb that will also protect the acute fracture seen on x-ray as well as potential scaphoid fracture.  Will provide hand surgery follow-up as needed.  Return precautions discussed.  Discussed follow-up with PCP.  Patient and husband both voiced understanding and are in agreement with plan.  Pain control with Tylenol and cold compress.  Patient has remained comfortable throughout her stay in the emergency room.   Additional history obtained: -Additional history obtained from husband who witnessed the fall.  States patient did not lose consciousness. -External records from outside source obtained and reviewed including: Chart review including previous notes, labs, imaging, consultation notes   Lab Tests: -I ordered, reviewed, and interpreted labs.   The pertinent results include:   Labs Reviewed - No data to display    EKG  EKG Interpretation  Date/Time:    Ventricular Rate:    PR Interval:    QRS Duration:   QT Interval:    QTC Calculation:   R Axis:     Text Interpretation:           Imaging Studies ordered: I ordered imaging studies including CT head, CT cervical spine, CT lumbar spine, left hip x-ray, right ankle x-ray, right hand x-ray I independently visualized and interpreted imaging. I agree with the  radiologist interpretation   Medicines ordered and prescription drug management: Meds ordered this encounter  Medications   HYDROcodone-acetaminophen (NORCO/VICODIN) 5-325 MG per tablet 1 tablet    -I have reviewed the patients home medicines and have made adjustments as needed  Reevaluation: After the interventions noted above, I reevaluated the patient and found that they have :improved  Co morbidities that complicate the patient evaluation  Past Medical History:  Diagnosis Date   Anemia    patient preference - stopped iron    ARF (acute renal failure) ([place]) 04/2016   dehydration   Chronic kidney disease    stage 5   Depression    Diabetes mellitus    Type II - pt preference - stopped lantus   Diabetic retinopathy ([place])    Diverticulitis    History of kidney stones  passed- 6   Hypertension    Osteomyelitis ([place])    Vitamin B 12 deficiency 06/09/2017   Vitamin D deficiency    Wears partial dentures    upper      Dispostion: Patient is appropriate for discharge.  Discharged in stable condition.  Return precautions discussed.  Patient voices understanding and is in agreement with plan.  Final Clinical Impression(s) / ED Diagnoses Final diagnoses:  Fall, initial encounter  Left hip pain  Nondisplaced fracture of distal phalanx of right thumb, initial encounter for closed fracture  Tenderness of anatomical snuffbox    Rx / DC Orders ED Discharge Orders     None         Evlyn Courier, PA-C 07/27/21 [place], [place], DO 07/27/21 1432

## 2021-07-27 NOTE — Discharge Instructions (Addendum)
Your work-up showed that you have a fracture towards the tip of the right thumb otherwise imaging did not show any other fractures.  Given that she also had pain at the base of the right thumb we are going to put you in a thumb spica splint that will cover both fractures that we are concerned about.  Given the pain that you have at the base of the thumb this raises concern for scaphoid fracture as we discussed.  We will empirically treat this with a splint as well.  I have given you hand surgery follow-up listed above as well.  Follow-up with your primary care provider as well.  If you have any worsening symptoms you can return to the emergency room.  Take Tylenol as needed for pain control.  You can also use cool compress over the affected areas.

## 2021-07-31 DIAGNOSIS — N189 Chronic kidney disease, unspecified: Secondary | ICD-10-CM | POA: Diagnosis not present

## 2021-07-31 DIAGNOSIS — R1110 Vomiting, unspecified: Secondary | ICD-10-CM | POA: Diagnosis not present

## 2021-07-31 DIAGNOSIS — N2581 Secondary hyperparathyroidism of renal origin: Secondary | ICD-10-CM | POA: Diagnosis not present

## 2021-07-31 DIAGNOSIS — Z992 Dependence on renal dialysis: Secondary | ICD-10-CM | POA: Diagnosis not present

## 2021-07-31 DIAGNOSIS — N186 End stage renal disease: Secondary | ICD-10-CM | POA: Diagnosis not present

## 2021-07-31 DIAGNOSIS — D509 Iron deficiency anemia, unspecified: Secondary | ICD-10-CM | POA: Diagnosis not present

## 2021-08-02 DIAGNOSIS — R1110 Vomiting, unspecified: Secondary | ICD-10-CM | POA: Diagnosis not present

## 2021-08-02 DIAGNOSIS — N2581 Secondary hyperparathyroidism of renal origin: Secondary | ICD-10-CM | POA: Diagnosis not present

## 2021-08-02 DIAGNOSIS — N189 Chronic kidney disease, unspecified: Secondary | ICD-10-CM | POA: Diagnosis not present

## 2021-08-02 DIAGNOSIS — N186 End stage renal disease: Secondary | ICD-10-CM | POA: Diagnosis not present

## 2021-08-02 DIAGNOSIS — Z992 Dependence on renal dialysis: Secondary | ICD-10-CM | POA: Diagnosis not present

## 2021-08-02 DIAGNOSIS — D509 Iron deficiency anemia, unspecified: Secondary | ICD-10-CM | POA: Diagnosis not present

## 2021-08-05 DIAGNOSIS — N2581 Secondary hyperparathyroidism of renal origin: Secondary | ICD-10-CM | POA: Diagnosis not present

## 2021-08-05 DIAGNOSIS — N186 End stage renal disease: Secondary | ICD-10-CM | POA: Diagnosis not present

## 2021-08-05 DIAGNOSIS — D509 Iron deficiency anemia, unspecified: Secondary | ICD-10-CM | POA: Diagnosis not present

## 2021-08-05 DIAGNOSIS — R1110 Vomiting, unspecified: Secondary | ICD-10-CM | POA: Diagnosis not present

## 2021-08-05 DIAGNOSIS — N189 Chronic kidney disease, unspecified: Secondary | ICD-10-CM | POA: Diagnosis not present

## 2021-08-05 DIAGNOSIS — Z992 Dependence on renal dialysis: Secondary | ICD-10-CM | POA: Diagnosis not present

## 2021-08-07 DIAGNOSIS — N189 Chronic kidney disease, unspecified: Secondary | ICD-10-CM | POA: Diagnosis not present

## 2021-08-07 DIAGNOSIS — N2581 Secondary hyperparathyroidism of renal origin: Secondary | ICD-10-CM | POA: Diagnosis not present

## 2021-08-07 DIAGNOSIS — R1110 Vomiting, unspecified: Secondary | ICD-10-CM | POA: Diagnosis not present

## 2021-08-07 DIAGNOSIS — N186 End stage renal disease: Secondary | ICD-10-CM | POA: Diagnosis not present

## 2021-08-07 DIAGNOSIS — D509 Iron deficiency anemia, unspecified: Secondary | ICD-10-CM | POA: Diagnosis not present

## 2021-08-07 DIAGNOSIS — Z992 Dependence on renal dialysis: Secondary | ICD-10-CM | POA: Diagnosis not present

## 2021-08-09 DIAGNOSIS — N189 Chronic kidney disease, unspecified: Secondary | ICD-10-CM | POA: Diagnosis not present

## 2021-08-09 DIAGNOSIS — N186 End stage renal disease: Secondary | ICD-10-CM | POA: Diagnosis not present

## 2021-08-09 DIAGNOSIS — N2581 Secondary hyperparathyroidism of renal origin: Secondary | ICD-10-CM | POA: Diagnosis not present

## 2021-08-09 DIAGNOSIS — Z992 Dependence on renal dialysis: Secondary | ICD-10-CM | POA: Diagnosis not present

## 2021-08-09 DIAGNOSIS — R1110 Vomiting, unspecified: Secondary | ICD-10-CM | POA: Diagnosis not present

## 2021-08-09 DIAGNOSIS — D509 Iron deficiency anemia, unspecified: Secondary | ICD-10-CM | POA: Diagnosis not present

## 2021-08-12 DIAGNOSIS — N2581 Secondary hyperparathyroidism of renal origin: Secondary | ICD-10-CM | POA: Diagnosis not present

## 2021-08-12 DIAGNOSIS — D509 Iron deficiency anemia, unspecified: Secondary | ICD-10-CM | POA: Diagnosis not present

## 2021-08-12 DIAGNOSIS — R1110 Vomiting, unspecified: Secondary | ICD-10-CM | POA: Diagnosis not present

## 2021-08-12 DIAGNOSIS — N186 End stage renal disease: Secondary | ICD-10-CM | POA: Diagnosis not present

## 2021-08-12 DIAGNOSIS — Z992 Dependence on renal dialysis: Secondary | ICD-10-CM | POA: Diagnosis not present

## 2021-08-12 DIAGNOSIS — N189 Chronic kidney disease, unspecified: Secondary | ICD-10-CM | POA: Diagnosis not present

## 2021-08-14 DIAGNOSIS — N2581 Secondary hyperparathyroidism of renal origin: Secondary | ICD-10-CM | POA: Diagnosis not present

## 2021-08-14 DIAGNOSIS — N186 End stage renal disease: Secondary | ICD-10-CM | POA: Diagnosis not present

## 2021-08-14 DIAGNOSIS — D509 Iron deficiency anemia, unspecified: Secondary | ICD-10-CM | POA: Diagnosis not present

## 2021-08-14 DIAGNOSIS — N189 Chronic kidney disease, unspecified: Secondary | ICD-10-CM | POA: Diagnosis not present

## 2021-08-14 DIAGNOSIS — R1110 Vomiting, unspecified: Secondary | ICD-10-CM | POA: Diagnosis not present

## 2021-08-14 DIAGNOSIS — Z992 Dependence on renal dialysis: Secondary | ICD-10-CM | POA: Diagnosis not present

## 2021-08-16 DIAGNOSIS — N189 Chronic kidney disease, unspecified: Secondary | ICD-10-CM | POA: Diagnosis not present

## 2021-08-16 DIAGNOSIS — Z992 Dependence on renal dialysis: Secondary | ICD-10-CM | POA: Diagnosis not present

## 2021-08-16 DIAGNOSIS — R1110 Vomiting, unspecified: Secondary | ICD-10-CM | POA: Diagnosis not present

## 2021-08-16 DIAGNOSIS — N2581 Secondary hyperparathyroidism of renal origin: Secondary | ICD-10-CM | POA: Diagnosis not present

## 2021-08-16 DIAGNOSIS — N186 End stage renal disease: Secondary | ICD-10-CM | POA: Diagnosis not present

## 2021-08-16 DIAGNOSIS — D509 Iron deficiency anemia, unspecified: Secondary | ICD-10-CM | POA: Diagnosis not present

## 2021-08-19 DIAGNOSIS — N186 End stage renal disease: Secondary | ICD-10-CM | POA: Diagnosis not present

## 2021-08-19 DIAGNOSIS — N2581 Secondary hyperparathyroidism of renal origin: Secondary | ICD-10-CM | POA: Diagnosis not present

## 2021-08-19 DIAGNOSIS — D509 Iron deficiency anemia, unspecified: Secondary | ICD-10-CM | POA: Diagnosis not present

## 2021-08-19 DIAGNOSIS — R1110 Vomiting, unspecified: Secondary | ICD-10-CM | POA: Diagnosis not present

## 2021-08-19 DIAGNOSIS — N189 Chronic kidney disease, unspecified: Secondary | ICD-10-CM | POA: Diagnosis not present

## 2021-08-19 DIAGNOSIS — Z992 Dependence on renal dialysis: Secondary | ICD-10-CM | POA: Diagnosis not present

## 2021-08-21 DIAGNOSIS — Z992 Dependence on renal dialysis: Secondary | ICD-10-CM | POA: Diagnosis not present

## 2021-08-21 DIAGNOSIS — R1110 Vomiting, unspecified: Secondary | ICD-10-CM | POA: Diagnosis not present

## 2021-08-21 DIAGNOSIS — N186 End stage renal disease: Secondary | ICD-10-CM | POA: Diagnosis not present

## 2021-08-21 DIAGNOSIS — N189 Chronic kidney disease, unspecified: Secondary | ICD-10-CM | POA: Diagnosis not present

## 2021-08-21 DIAGNOSIS — N2581 Secondary hyperparathyroidism of renal origin: Secondary | ICD-10-CM | POA: Diagnosis not present

## 2021-08-21 DIAGNOSIS — D509 Iron deficiency anemia, unspecified: Secondary | ICD-10-CM | POA: Diagnosis not present

## 2021-08-23 DIAGNOSIS — N189 Chronic kidney disease, unspecified: Secondary | ICD-10-CM | POA: Diagnosis not present

## 2021-08-23 DIAGNOSIS — R110 Nausea: Secondary | ICD-10-CM | POA: Diagnosis not present

## 2021-08-23 DIAGNOSIS — E1122 Type 2 diabetes mellitus with diabetic chronic kidney disease: Secondary | ICD-10-CM | POA: Diagnosis not present

## 2021-08-23 DIAGNOSIS — E1129 Type 2 diabetes mellitus with other diabetic kidney complication: Secondary | ICD-10-CM | POA: Diagnosis not present

## 2021-08-23 DIAGNOSIS — Z992 Dependence on renal dialysis: Secondary | ICD-10-CM | POA: Diagnosis not present

## 2021-08-23 DIAGNOSIS — N186 End stage renal disease: Secondary | ICD-10-CM | POA: Diagnosis not present

## 2021-08-23 DIAGNOSIS — D509 Iron deficiency anemia, unspecified: Secondary | ICD-10-CM | POA: Diagnosis not present

## 2021-08-23 DIAGNOSIS — D631 Anemia in chronic kidney disease: Secondary | ICD-10-CM | POA: Diagnosis not present

## 2021-08-23 DIAGNOSIS — N2581 Secondary hyperparathyroidism of renal origin: Secondary | ICD-10-CM | POA: Diagnosis not present

## 2021-08-26 DIAGNOSIS — Z992 Dependence on renal dialysis: Secondary | ICD-10-CM | POA: Diagnosis not present

## 2021-08-26 DIAGNOSIS — N2581 Secondary hyperparathyroidism of renal origin: Secondary | ICD-10-CM | POA: Diagnosis not present

## 2021-08-26 DIAGNOSIS — N189 Chronic kidney disease, unspecified: Secondary | ICD-10-CM | POA: Diagnosis not present

## 2021-08-26 DIAGNOSIS — D509 Iron deficiency anemia, unspecified: Secondary | ICD-10-CM | POA: Diagnosis not present

## 2021-08-26 DIAGNOSIS — N186 End stage renal disease: Secondary | ICD-10-CM | POA: Diagnosis not present

## 2021-08-26 DIAGNOSIS — R110 Nausea: Secondary | ICD-10-CM | POA: Diagnosis not present

## 2021-08-28 DIAGNOSIS — R110 Nausea: Secondary | ICD-10-CM | POA: Diagnosis not present

## 2021-08-28 DIAGNOSIS — N2581 Secondary hyperparathyroidism of renal origin: Secondary | ICD-10-CM | POA: Diagnosis not present

## 2021-08-28 DIAGNOSIS — N189 Chronic kidney disease, unspecified: Secondary | ICD-10-CM | POA: Diagnosis not present

## 2021-08-28 DIAGNOSIS — D509 Iron deficiency anemia, unspecified: Secondary | ICD-10-CM | POA: Diagnosis not present

## 2021-08-28 DIAGNOSIS — Z992 Dependence on renal dialysis: Secondary | ICD-10-CM | POA: Diagnosis not present

## 2021-08-28 DIAGNOSIS — N186 End stage renal disease: Secondary | ICD-10-CM | POA: Diagnosis not present

## 2021-08-29 DIAGNOSIS — I10 Essential (primary) hypertension: Secondary | ICD-10-CM | POA: Diagnosis not present

## 2021-08-29 DIAGNOSIS — N184 Chronic kidney disease, stage 4 (severe): Secondary | ICD-10-CM | POA: Diagnosis not present

## 2021-08-30 DIAGNOSIS — R110 Nausea: Secondary | ICD-10-CM | POA: Diagnosis not present

## 2021-08-30 DIAGNOSIS — D509 Iron deficiency anemia, unspecified: Secondary | ICD-10-CM | POA: Diagnosis not present

## 2021-08-30 DIAGNOSIS — N186 End stage renal disease: Secondary | ICD-10-CM | POA: Diagnosis not present

## 2021-08-30 DIAGNOSIS — N189 Chronic kidney disease, unspecified: Secondary | ICD-10-CM | POA: Diagnosis not present

## 2021-08-30 DIAGNOSIS — N2581 Secondary hyperparathyroidism of renal origin: Secondary | ICD-10-CM | POA: Diagnosis not present

## 2021-08-30 DIAGNOSIS — Z992 Dependence on renal dialysis: Secondary | ICD-10-CM | POA: Diagnosis not present

## 2021-09-02 DIAGNOSIS — D509 Iron deficiency anemia, unspecified: Secondary | ICD-10-CM | POA: Diagnosis not present

## 2021-09-02 DIAGNOSIS — N189 Chronic kidney disease, unspecified: Secondary | ICD-10-CM | POA: Diagnosis not present

## 2021-09-02 DIAGNOSIS — N186 End stage renal disease: Secondary | ICD-10-CM | POA: Diagnosis not present

## 2021-09-02 DIAGNOSIS — N2581 Secondary hyperparathyroidism of renal origin: Secondary | ICD-10-CM | POA: Diagnosis not present

## 2021-09-02 DIAGNOSIS — Z992 Dependence on renal dialysis: Secondary | ICD-10-CM | POA: Diagnosis not present

## 2021-09-02 DIAGNOSIS — R110 Nausea: Secondary | ICD-10-CM | POA: Diagnosis not present

## 2021-09-04 DIAGNOSIS — N186 End stage renal disease: Secondary | ICD-10-CM | POA: Diagnosis not present

## 2021-09-04 DIAGNOSIS — Z992 Dependence on renal dialysis: Secondary | ICD-10-CM | POA: Diagnosis not present

## 2021-09-04 DIAGNOSIS — N189 Chronic kidney disease, unspecified: Secondary | ICD-10-CM | POA: Diagnosis not present

## 2021-09-04 DIAGNOSIS — D509 Iron deficiency anemia, unspecified: Secondary | ICD-10-CM | POA: Diagnosis not present

## 2021-09-04 DIAGNOSIS — R110 Nausea: Secondary | ICD-10-CM | POA: Diagnosis not present

## 2021-09-04 DIAGNOSIS — N2581 Secondary hyperparathyroidism of renal origin: Secondary | ICD-10-CM | POA: Diagnosis not present

## 2021-09-09 DIAGNOSIS — R110 Nausea: Secondary | ICD-10-CM | POA: Diagnosis not present

## 2021-09-09 DIAGNOSIS — N189 Chronic kidney disease, unspecified: Secondary | ICD-10-CM | POA: Diagnosis not present

## 2021-09-09 DIAGNOSIS — D509 Iron deficiency anemia, unspecified: Secondary | ICD-10-CM | POA: Diagnosis not present

## 2021-09-09 DIAGNOSIS — N186 End stage renal disease: Secondary | ICD-10-CM | POA: Diagnosis not present

## 2021-09-09 DIAGNOSIS — Z992 Dependence on renal dialysis: Secondary | ICD-10-CM | POA: Diagnosis not present

## 2021-09-09 DIAGNOSIS — N2581 Secondary hyperparathyroidism of renal origin: Secondary | ICD-10-CM | POA: Diagnosis not present

## 2021-09-11 DIAGNOSIS — R110 Nausea: Secondary | ICD-10-CM | POA: Diagnosis not present

## 2021-09-11 DIAGNOSIS — Z992 Dependence on renal dialysis: Secondary | ICD-10-CM | POA: Diagnosis not present

## 2021-09-11 DIAGNOSIS — N189 Chronic kidney disease, unspecified: Secondary | ICD-10-CM | POA: Diagnosis not present

## 2021-09-11 DIAGNOSIS — N2581 Secondary hyperparathyroidism of renal origin: Secondary | ICD-10-CM | POA: Diagnosis not present

## 2021-09-11 DIAGNOSIS — N186 End stage renal disease: Secondary | ICD-10-CM | POA: Diagnosis not present

## 2021-09-11 DIAGNOSIS — E1122 Type 2 diabetes mellitus with diabetic chronic kidney disease: Secondary | ICD-10-CM | POA: Diagnosis not present

## 2021-09-11 DIAGNOSIS — D509 Iron deficiency anemia, unspecified: Secondary | ICD-10-CM | POA: Diagnosis not present

## 2021-09-13 DIAGNOSIS — D509 Iron deficiency anemia, unspecified: Secondary | ICD-10-CM | POA: Diagnosis not present

## 2021-09-13 DIAGNOSIS — Z992 Dependence on renal dialysis: Secondary | ICD-10-CM | POA: Diagnosis not present

## 2021-09-13 DIAGNOSIS — N2581 Secondary hyperparathyroidism of renal origin: Secondary | ICD-10-CM | POA: Diagnosis not present

## 2021-09-13 DIAGNOSIS — R110 Nausea: Secondary | ICD-10-CM | POA: Diagnosis not present

## 2021-09-13 DIAGNOSIS — N186 End stage renal disease: Secondary | ICD-10-CM | POA: Diagnosis not present

## 2021-09-13 DIAGNOSIS — N189 Chronic kidney disease, unspecified: Secondary | ICD-10-CM | POA: Diagnosis not present

## 2021-09-16 DIAGNOSIS — Z992 Dependence on renal dialysis: Secondary | ICD-10-CM | POA: Diagnosis not present

## 2021-09-16 DIAGNOSIS — N186 End stage renal disease: Secondary | ICD-10-CM | POA: Diagnosis not present

## 2021-09-16 DIAGNOSIS — D509 Iron deficiency anemia, unspecified: Secondary | ICD-10-CM | POA: Diagnosis not present

## 2021-09-16 DIAGNOSIS — R110 Nausea: Secondary | ICD-10-CM | POA: Diagnosis not present

## 2021-09-16 DIAGNOSIS — N2581 Secondary hyperparathyroidism of renal origin: Secondary | ICD-10-CM | POA: Diagnosis not present

## 2021-09-16 DIAGNOSIS — N189 Chronic kidney disease, unspecified: Secondary | ICD-10-CM | POA: Diagnosis not present

## 2021-09-18 DIAGNOSIS — N2581 Secondary hyperparathyroidism of renal origin: Secondary | ICD-10-CM | POA: Diagnosis not present

## 2021-09-18 DIAGNOSIS — Z992 Dependence on renal dialysis: Secondary | ICD-10-CM | POA: Diagnosis not present

## 2021-09-18 DIAGNOSIS — R110 Nausea: Secondary | ICD-10-CM | POA: Diagnosis not present

## 2021-09-18 DIAGNOSIS — D509 Iron deficiency anemia, unspecified: Secondary | ICD-10-CM | POA: Diagnosis not present

## 2021-09-18 DIAGNOSIS — N189 Chronic kidney disease, unspecified: Secondary | ICD-10-CM | POA: Diagnosis not present

## 2021-09-18 DIAGNOSIS — N186 End stage renal disease: Secondary | ICD-10-CM | POA: Diagnosis not present

## 2021-09-23 DIAGNOSIS — E1122 Type 2 diabetes mellitus with diabetic chronic kidney disease: Secondary | ICD-10-CM | POA: Diagnosis not present

## 2021-09-23 DIAGNOSIS — R110 Nausea: Secondary | ICD-10-CM | POA: Diagnosis not present

## 2021-09-23 DIAGNOSIS — D509 Iron deficiency anemia, unspecified: Secondary | ICD-10-CM | POA: Diagnosis not present

## 2021-09-23 DIAGNOSIS — E1129 Type 2 diabetes mellitus with other diabetic kidney complication: Secondary | ICD-10-CM | POA: Diagnosis not present

## 2021-09-23 DIAGNOSIS — N2581 Secondary hyperparathyroidism of renal origin: Secondary | ICD-10-CM | POA: Diagnosis not present

## 2021-09-23 DIAGNOSIS — N189 Chronic kidney disease, unspecified: Secondary | ICD-10-CM | POA: Diagnosis not present

## 2021-09-23 DIAGNOSIS — D631 Anemia in chronic kidney disease: Secondary | ICD-10-CM | POA: Diagnosis not present

## 2021-09-23 DIAGNOSIS — Z992 Dependence on renal dialysis: Secondary | ICD-10-CM | POA: Diagnosis not present

## 2021-09-23 DIAGNOSIS — N186 End stage renal disease: Secondary | ICD-10-CM | POA: Diagnosis not present

## 2021-09-25 DIAGNOSIS — R110 Nausea: Secondary | ICD-10-CM | POA: Diagnosis not present

## 2021-09-25 DIAGNOSIS — N186 End stage renal disease: Secondary | ICD-10-CM | POA: Diagnosis not present

## 2021-09-25 DIAGNOSIS — N2581 Secondary hyperparathyroidism of renal origin: Secondary | ICD-10-CM | POA: Diagnosis not present

## 2021-09-25 DIAGNOSIS — E1122 Type 2 diabetes mellitus with diabetic chronic kidney disease: Secondary | ICD-10-CM | POA: Diagnosis not present

## 2021-09-25 DIAGNOSIS — N189 Chronic kidney disease, unspecified: Secondary | ICD-10-CM | POA: Diagnosis not present

## 2021-09-25 DIAGNOSIS — Z992 Dependence on renal dialysis: Secondary | ICD-10-CM | POA: Diagnosis not present

## 2021-09-27 DIAGNOSIS — E1122 Type 2 diabetes mellitus with diabetic chronic kidney disease: Secondary | ICD-10-CM | POA: Diagnosis not present

## 2021-09-27 DIAGNOSIS — N189 Chronic kidney disease, unspecified: Secondary | ICD-10-CM | POA: Diagnosis not present

## 2021-09-27 DIAGNOSIS — N2581 Secondary hyperparathyroidism of renal origin: Secondary | ICD-10-CM | POA: Diagnosis not present

## 2021-09-27 DIAGNOSIS — Z992 Dependence on renal dialysis: Secondary | ICD-10-CM | POA: Diagnosis not present

## 2021-09-27 DIAGNOSIS — N186 End stage renal disease: Secondary | ICD-10-CM | POA: Diagnosis not present

## 2021-09-27 DIAGNOSIS — R110 Nausea: Secondary | ICD-10-CM | POA: Diagnosis not present

## 2021-09-30 DIAGNOSIS — N2581 Secondary hyperparathyroidism of renal origin: Secondary | ICD-10-CM | POA: Diagnosis not present

## 2021-09-30 DIAGNOSIS — R110 Nausea: Secondary | ICD-10-CM | POA: Diagnosis not present

## 2021-09-30 DIAGNOSIS — N189 Chronic kidney disease, unspecified: Secondary | ICD-10-CM | POA: Diagnosis not present

## 2021-09-30 DIAGNOSIS — E1122 Type 2 diabetes mellitus with diabetic chronic kidney disease: Secondary | ICD-10-CM | POA: Diagnosis not present

## 2021-09-30 DIAGNOSIS — N186 End stage renal disease: Secondary | ICD-10-CM | POA: Diagnosis not present

## 2021-09-30 DIAGNOSIS — Z992 Dependence on renal dialysis: Secondary | ICD-10-CM | POA: Diagnosis not present

## 2021-10-02 DIAGNOSIS — N186 End stage renal disease: Secondary | ICD-10-CM | POA: Diagnosis not present

## 2021-10-02 DIAGNOSIS — E1122 Type 2 diabetes mellitus with diabetic chronic kidney disease: Secondary | ICD-10-CM | POA: Diagnosis not present

## 2021-10-02 DIAGNOSIS — N2581 Secondary hyperparathyroidism of renal origin: Secondary | ICD-10-CM | POA: Diagnosis not present

## 2021-10-02 DIAGNOSIS — R110 Nausea: Secondary | ICD-10-CM | POA: Diagnosis not present

## 2021-10-02 DIAGNOSIS — Z992 Dependence on renal dialysis: Secondary | ICD-10-CM | POA: Diagnosis not present

## 2021-10-02 DIAGNOSIS — N189 Chronic kidney disease, unspecified: Secondary | ICD-10-CM | POA: Diagnosis not present

## 2021-10-04 DIAGNOSIS — R110 Nausea: Secondary | ICD-10-CM | POA: Diagnosis not present

## 2021-10-04 DIAGNOSIS — Z992 Dependence on renal dialysis: Secondary | ICD-10-CM | POA: Diagnosis not present

## 2021-10-04 DIAGNOSIS — E1122 Type 2 diabetes mellitus with diabetic chronic kidney disease: Secondary | ICD-10-CM | POA: Diagnosis not present

## 2021-10-04 DIAGNOSIS — N189 Chronic kidney disease, unspecified: Secondary | ICD-10-CM | POA: Diagnosis not present

## 2021-10-04 DIAGNOSIS — N2581 Secondary hyperparathyroidism of renal origin: Secondary | ICD-10-CM | POA: Diagnosis not present

## 2021-10-04 DIAGNOSIS — N186 End stage renal disease: Secondary | ICD-10-CM | POA: Diagnosis not present

## 2021-10-07 DIAGNOSIS — N2581 Secondary hyperparathyroidism of renal origin: Secondary | ICD-10-CM | POA: Diagnosis not present

## 2021-10-07 DIAGNOSIS — N189 Chronic kidney disease, unspecified: Secondary | ICD-10-CM | POA: Diagnosis not present

## 2021-10-07 DIAGNOSIS — R110 Nausea: Secondary | ICD-10-CM | POA: Diagnosis not present

## 2021-10-07 DIAGNOSIS — E1122 Type 2 diabetes mellitus with diabetic chronic kidney disease: Secondary | ICD-10-CM | POA: Diagnosis not present

## 2021-10-07 DIAGNOSIS — Z992 Dependence on renal dialysis: Secondary | ICD-10-CM | POA: Diagnosis not present

## 2021-10-07 DIAGNOSIS — N186 End stage renal disease: Secondary | ICD-10-CM | POA: Diagnosis not present

## 2021-10-09 DIAGNOSIS — R110 Nausea: Secondary | ICD-10-CM | POA: Diagnosis not present

## 2021-10-09 DIAGNOSIS — E1122 Type 2 diabetes mellitus with diabetic chronic kidney disease: Secondary | ICD-10-CM | POA: Diagnosis not present

## 2021-10-09 DIAGNOSIS — Z992 Dependence on renal dialysis: Secondary | ICD-10-CM | POA: Diagnosis not present

## 2021-10-09 DIAGNOSIS — N186 End stage renal disease: Secondary | ICD-10-CM | POA: Diagnosis not present

## 2021-10-09 DIAGNOSIS — N2581 Secondary hyperparathyroidism of renal origin: Secondary | ICD-10-CM | POA: Diagnosis not present

## 2021-10-09 DIAGNOSIS — N189 Chronic kidney disease, unspecified: Secondary | ICD-10-CM | POA: Diagnosis not present

## 2021-10-14 DIAGNOSIS — N186 End stage renal disease: Secondary | ICD-10-CM | POA: Diagnosis not present

## 2021-10-14 DIAGNOSIS — R110 Nausea: Secondary | ICD-10-CM | POA: Diagnosis not present

## 2021-10-14 DIAGNOSIS — Z992 Dependence on renal dialysis: Secondary | ICD-10-CM | POA: Diagnosis not present

## 2021-10-14 DIAGNOSIS — N189 Chronic kidney disease, unspecified: Secondary | ICD-10-CM | POA: Diagnosis not present

## 2021-10-14 DIAGNOSIS — E1122 Type 2 diabetes mellitus with diabetic chronic kidney disease: Secondary | ICD-10-CM | POA: Diagnosis not present

## 2021-10-14 DIAGNOSIS — N2581 Secondary hyperparathyroidism of renal origin: Secondary | ICD-10-CM | POA: Diagnosis not present

## 2021-10-15 DIAGNOSIS — N186 End stage renal disease: Secondary | ICD-10-CM | POA: Diagnosis not present

## 2021-10-15 DIAGNOSIS — I771 Stricture of artery: Secondary | ICD-10-CM | POA: Diagnosis not present

## 2021-10-15 DIAGNOSIS — Z992 Dependence on renal dialysis: Secondary | ICD-10-CM | POA: Diagnosis not present

## 2021-10-15 DIAGNOSIS — I871 Compression of vein: Secondary | ICD-10-CM | POA: Diagnosis not present

## 2021-10-16 DIAGNOSIS — Z992 Dependence on renal dialysis: Secondary | ICD-10-CM | POA: Diagnosis not present

## 2021-10-16 DIAGNOSIS — E1122 Type 2 diabetes mellitus with diabetic chronic kidney disease: Secondary | ICD-10-CM | POA: Diagnosis not present

## 2021-10-16 DIAGNOSIS — N189 Chronic kidney disease, unspecified: Secondary | ICD-10-CM | POA: Diagnosis not present

## 2021-10-16 DIAGNOSIS — N186 End stage renal disease: Secondary | ICD-10-CM | POA: Diagnosis not present

## 2021-10-16 DIAGNOSIS — R110 Nausea: Secondary | ICD-10-CM | POA: Diagnosis not present

## 2021-10-16 DIAGNOSIS — N2581 Secondary hyperparathyroidism of renal origin: Secondary | ICD-10-CM | POA: Diagnosis not present

## 2021-10-18 DIAGNOSIS — Z992 Dependence on renal dialysis: Secondary | ICD-10-CM | POA: Diagnosis not present

## 2021-10-18 DIAGNOSIS — R110 Nausea: Secondary | ICD-10-CM | POA: Diagnosis not present

## 2021-10-18 DIAGNOSIS — N189 Chronic kidney disease, unspecified: Secondary | ICD-10-CM | POA: Diagnosis not present

## 2021-10-18 DIAGNOSIS — E1122 Type 2 diabetes mellitus with diabetic chronic kidney disease: Secondary | ICD-10-CM | POA: Diagnosis not present

## 2021-10-18 DIAGNOSIS — N186 End stage renal disease: Secondary | ICD-10-CM | POA: Diagnosis not present

## 2021-10-18 DIAGNOSIS — N2581 Secondary hyperparathyroidism of renal origin: Secondary | ICD-10-CM | POA: Diagnosis not present

## 2021-10-21 DIAGNOSIS — N2581 Secondary hyperparathyroidism of renal origin: Secondary | ICD-10-CM | POA: Diagnosis not present

## 2021-10-21 DIAGNOSIS — Z992 Dependence on renal dialysis: Secondary | ICD-10-CM | POA: Diagnosis not present

## 2021-10-21 DIAGNOSIS — E1122 Type 2 diabetes mellitus with diabetic chronic kidney disease: Secondary | ICD-10-CM | POA: Diagnosis not present

## 2021-10-21 DIAGNOSIS — N189 Chronic kidney disease, unspecified: Secondary | ICD-10-CM | POA: Diagnosis not present

## 2021-10-21 DIAGNOSIS — N186 End stage renal disease: Secondary | ICD-10-CM | POA: Diagnosis not present

## 2021-10-21 DIAGNOSIS — R110 Nausea: Secondary | ICD-10-CM | POA: Diagnosis not present

## 2021-10-23 DIAGNOSIS — N186 End stage renal disease: Secondary | ICD-10-CM | POA: Diagnosis not present

## 2021-10-23 DIAGNOSIS — N189 Chronic kidney disease, unspecified: Secondary | ICD-10-CM | POA: Diagnosis not present

## 2021-10-23 DIAGNOSIS — Z992 Dependence on renal dialysis: Secondary | ICD-10-CM | POA: Diagnosis not present

## 2021-10-23 DIAGNOSIS — N2581 Secondary hyperparathyroidism of renal origin: Secondary | ICD-10-CM | POA: Diagnosis not present

## 2021-10-23 DIAGNOSIS — R110 Nausea: Secondary | ICD-10-CM | POA: Diagnosis not present

## 2021-10-23 DIAGNOSIS — E1122 Type 2 diabetes mellitus with diabetic chronic kidney disease: Secondary | ICD-10-CM | POA: Diagnosis not present

## 2021-10-24 DIAGNOSIS — E1129 Type 2 diabetes mellitus with other diabetic kidney complication: Secondary | ICD-10-CM | POA: Diagnosis not present

## 2021-10-24 DIAGNOSIS — Z992 Dependence on renal dialysis: Secondary | ICD-10-CM | POA: Diagnosis not present

## 2021-10-24 DIAGNOSIS — N186 End stage renal disease: Secondary | ICD-10-CM | POA: Diagnosis not present

## 2021-10-25 DIAGNOSIS — D509 Iron deficiency anemia, unspecified: Secondary | ICD-10-CM | POA: Diagnosis not present

## 2021-10-25 DIAGNOSIS — E1122 Type 2 diabetes mellitus with diabetic chronic kidney disease: Secondary | ICD-10-CM | POA: Diagnosis not present

## 2021-10-25 DIAGNOSIS — D631 Anemia in chronic kidney disease: Secondary | ICD-10-CM | POA: Diagnosis not present

## 2021-10-25 DIAGNOSIS — N186 End stage renal disease: Secondary | ICD-10-CM | POA: Diagnosis not present

## 2021-10-25 DIAGNOSIS — N2581 Secondary hyperparathyroidism of renal origin: Secondary | ICD-10-CM | POA: Diagnosis not present

## 2021-10-25 DIAGNOSIS — Z992 Dependence on renal dialysis: Secondary | ICD-10-CM | POA: Diagnosis not present

## 2021-10-25 DIAGNOSIS — N189 Chronic kidney disease, unspecified: Secondary | ICD-10-CM | POA: Diagnosis not present

## 2021-10-28 DIAGNOSIS — Z992 Dependence on renal dialysis: Secondary | ICD-10-CM | POA: Diagnosis not present

## 2021-10-28 DIAGNOSIS — N2581 Secondary hyperparathyroidism of renal origin: Secondary | ICD-10-CM | POA: Diagnosis not present

## 2021-10-28 DIAGNOSIS — N186 End stage renal disease: Secondary | ICD-10-CM | POA: Diagnosis not present

## 2021-10-28 DIAGNOSIS — D631 Anemia in chronic kidney disease: Secondary | ICD-10-CM | POA: Diagnosis not present

## 2021-10-28 DIAGNOSIS — R4189 Other symptoms and signs involving cognitive functions and awareness: Secondary | ICD-10-CM | POA: Diagnosis not present

## 2021-10-28 DIAGNOSIS — F0150 Vascular dementia without behavioral disturbance: Secondary | ICD-10-CM | POA: Diagnosis not present

## 2021-10-28 DIAGNOSIS — N189 Chronic kidney disease, unspecified: Secondary | ICD-10-CM | POA: Diagnosis not present

## 2021-10-28 DIAGNOSIS — D509 Iron deficiency anemia, unspecified: Secondary | ICD-10-CM | POA: Diagnosis not present

## 2021-10-30 DIAGNOSIS — N186 End stage renal disease: Secondary | ICD-10-CM | POA: Diagnosis not present

## 2021-10-30 DIAGNOSIS — D509 Iron deficiency anemia, unspecified: Secondary | ICD-10-CM | POA: Diagnosis not present

## 2021-10-30 DIAGNOSIS — Z992 Dependence on renal dialysis: Secondary | ICD-10-CM | POA: Diagnosis not present

## 2021-10-30 DIAGNOSIS — N2581 Secondary hyperparathyroidism of renal origin: Secondary | ICD-10-CM | POA: Diagnosis not present

## 2021-10-30 DIAGNOSIS — D631 Anemia in chronic kidney disease: Secondary | ICD-10-CM | POA: Diagnosis not present

## 2021-10-30 DIAGNOSIS — N189 Chronic kidney disease, unspecified: Secondary | ICD-10-CM | POA: Diagnosis not present

## 2021-11-01 DIAGNOSIS — N2581 Secondary hyperparathyroidism of renal origin: Secondary | ICD-10-CM | POA: Diagnosis not present

## 2021-11-01 DIAGNOSIS — N189 Chronic kidney disease, unspecified: Secondary | ICD-10-CM | POA: Diagnosis not present

## 2021-11-01 DIAGNOSIS — N186 End stage renal disease: Secondary | ICD-10-CM | POA: Diagnosis not present

## 2021-11-01 DIAGNOSIS — D631 Anemia in chronic kidney disease: Secondary | ICD-10-CM | POA: Diagnosis not present

## 2021-11-01 DIAGNOSIS — Z992 Dependence on renal dialysis: Secondary | ICD-10-CM | POA: Diagnosis not present

## 2021-11-01 DIAGNOSIS — D509 Iron deficiency anemia, unspecified: Secondary | ICD-10-CM | POA: Diagnosis not present

## 2021-11-03 ENCOUNTER — Telehealth: Payer: Self-pay

## 2021-11-03 NOTE — Telephone Encounter (Signed)
Attempted to contact patient's spouse Elenore Rota to schedule a Palliative Care consult appointment. No answer left a message to return call.

## 2021-11-04 ENCOUNTER — Encounter: Payer: Self-pay | Admitting: Hematology and Oncology

## 2021-11-04 DIAGNOSIS — N189 Chronic kidney disease, unspecified: Secondary | ICD-10-CM | POA: Diagnosis not present

## 2021-11-04 DIAGNOSIS — N2581 Secondary hyperparathyroidism of renal origin: Secondary | ICD-10-CM | POA: Diagnosis not present

## 2021-11-04 DIAGNOSIS — D509 Iron deficiency anemia, unspecified: Secondary | ICD-10-CM | POA: Diagnosis not present

## 2021-11-04 DIAGNOSIS — D631 Anemia in chronic kidney disease: Secondary | ICD-10-CM | POA: Diagnosis not present

## 2021-11-04 DIAGNOSIS — Z992 Dependence on renal dialysis: Secondary | ICD-10-CM | POA: Diagnosis not present

## 2021-11-04 DIAGNOSIS — N186 End stage renal disease: Secondary | ICD-10-CM | POA: Diagnosis not present

## 2021-11-06 DIAGNOSIS — D509 Iron deficiency anemia, unspecified: Secondary | ICD-10-CM | POA: Diagnosis not present

## 2021-11-06 DIAGNOSIS — D631 Anemia in chronic kidney disease: Secondary | ICD-10-CM | POA: Diagnosis not present

## 2021-11-06 DIAGNOSIS — Z992 Dependence on renal dialysis: Secondary | ICD-10-CM | POA: Diagnosis not present

## 2021-11-06 DIAGNOSIS — N189 Chronic kidney disease, unspecified: Secondary | ICD-10-CM | POA: Diagnosis not present

## 2021-11-06 DIAGNOSIS — N186 End stage renal disease: Secondary | ICD-10-CM | POA: Diagnosis not present

## 2021-11-06 DIAGNOSIS — N2581 Secondary hyperparathyroidism of renal origin: Secondary | ICD-10-CM | POA: Diagnosis not present

## 2021-11-11 ENCOUNTER — Telehealth: Payer: Self-pay

## 2021-11-11 DIAGNOSIS — N186 End stage renal disease: Secondary | ICD-10-CM | POA: Diagnosis not present

## 2021-11-11 DIAGNOSIS — N189 Chronic kidney disease, unspecified: Secondary | ICD-10-CM | POA: Diagnosis not present

## 2021-11-11 DIAGNOSIS — Z992 Dependence on renal dialysis: Secondary | ICD-10-CM | POA: Diagnosis not present

## 2021-11-11 DIAGNOSIS — N2581 Secondary hyperparathyroidism of renal origin: Secondary | ICD-10-CM | POA: Diagnosis not present

## 2021-11-11 DIAGNOSIS — D631 Anemia in chronic kidney disease: Secondary | ICD-10-CM | POA: Diagnosis not present

## 2021-11-11 DIAGNOSIS — D509 Iron deficiency anemia, unspecified: Secondary | ICD-10-CM | POA: Diagnosis not present

## 2021-11-11 NOTE — Telephone Encounter (Signed)
Spoke with patient's spouse Elenore Rota regarding Palliative Care services. He requested to call back after patient follows up with her PCP on 9/20.

## 2021-11-12 ENCOUNTER — Telehealth: Payer: Self-pay

## 2021-11-12 DIAGNOSIS — Z0000 Encounter for general adult medical examination without abnormal findings: Secondary | ICD-10-CM | POA: Diagnosis not present

## 2021-11-12 DIAGNOSIS — D509 Iron deficiency anemia, unspecified: Secondary | ICD-10-CM | POA: Diagnosis not present

## 2021-11-12 DIAGNOSIS — N186 End stage renal disease: Secondary | ICD-10-CM | POA: Diagnosis not present

## 2021-11-12 DIAGNOSIS — E559 Vitamin D deficiency, unspecified: Secondary | ICD-10-CM | POA: Diagnosis not present

## 2021-11-12 DIAGNOSIS — N184 Chronic kidney disease, stage 4 (severe): Secondary | ICD-10-CM | POA: Diagnosis not present

## 2021-11-12 DIAGNOSIS — D631 Anemia in chronic kidney disease: Secondary | ICD-10-CM | POA: Diagnosis not present

## 2021-11-12 DIAGNOSIS — Z23 Encounter for immunization: Secondary | ICD-10-CM | POA: Diagnosis not present

## 2021-11-12 DIAGNOSIS — I10 Essential (primary) hypertension: Secondary | ICD-10-CM | POA: Diagnosis not present

## 2021-11-12 DIAGNOSIS — Z1339 Encounter for screening examination for other mental health and behavioral disorders: Secondary | ICD-10-CM | POA: Diagnosis not present

## 2021-11-12 DIAGNOSIS — R011 Cardiac murmur, unspecified: Secondary | ICD-10-CM | POA: Diagnosis not present

## 2021-11-12 DIAGNOSIS — E1151 Type 2 diabetes mellitus with diabetic peripheral angiopathy without gangrene: Secondary | ICD-10-CM | POA: Diagnosis not present

## 2021-11-12 DIAGNOSIS — Z1331 Encounter for screening for depression: Secondary | ICD-10-CM | POA: Diagnosis not present

## 2021-11-12 NOTE — Telephone Encounter (Signed)
Spoke with patient's spouse Elenore Rota and scheduled a telephonic Palliative Consult for 11/24/21 @ 2:30 PM per provider's availability.   Consent obtained; updated Nets[employer], Team List and Epic.

## 2021-11-13 DIAGNOSIS — N186 End stage renal disease: Secondary | ICD-10-CM | POA: Diagnosis not present

## 2021-11-13 DIAGNOSIS — D631 Anemia in chronic kidney disease: Secondary | ICD-10-CM | POA: Diagnosis not present

## 2021-11-13 DIAGNOSIS — N2581 Secondary hyperparathyroidism of renal origin: Secondary | ICD-10-CM | POA: Diagnosis not present

## 2021-11-13 DIAGNOSIS — Z992 Dependence on renal dialysis: Secondary | ICD-10-CM | POA: Diagnosis not present

## 2021-11-13 DIAGNOSIS — D509 Iron deficiency anemia, unspecified: Secondary | ICD-10-CM | POA: Diagnosis not present

## 2021-11-13 DIAGNOSIS — N189 Chronic kidney disease, unspecified: Secondary | ICD-10-CM | POA: Diagnosis not present

## 2021-11-14 DIAGNOSIS — R0602 Shortness of breath: Secondary | ICD-10-CM | POA: Diagnosis not present

## 2021-11-14 DIAGNOSIS — E8770 Fluid overload, unspecified: Secondary | ICD-10-CM | POA: Diagnosis not present

## 2021-11-14 DIAGNOSIS — Z992 Dependence on renal dialysis: Secondary | ICD-10-CM | POA: Diagnosis not present

## 2021-11-14 DIAGNOSIS — N2581 Secondary hyperparathyroidism of renal origin: Secondary | ICD-10-CM | POA: Diagnosis not present

## 2021-11-14 DIAGNOSIS — N186 End stage renal disease: Secondary | ICD-10-CM | POA: Diagnosis not present

## 2021-11-15 DIAGNOSIS — Z992 Dependence on renal dialysis: Secondary | ICD-10-CM | POA: Diagnosis not present

## 2021-11-15 DIAGNOSIS — N189 Chronic kidney disease, unspecified: Secondary | ICD-10-CM | POA: Diagnosis not present

## 2021-11-15 DIAGNOSIS — N186 End stage renal disease: Secondary | ICD-10-CM | POA: Diagnosis not present

## 2021-11-15 DIAGNOSIS — D631 Anemia in chronic kidney disease: Secondary | ICD-10-CM | POA: Diagnosis not present

## 2021-11-15 DIAGNOSIS — N2581 Secondary hyperparathyroidism of renal origin: Secondary | ICD-10-CM | POA: Diagnosis not present

## 2021-11-15 DIAGNOSIS — D509 Iron deficiency anemia, unspecified: Secondary | ICD-10-CM | POA: Diagnosis not present

## 2021-11-18 DIAGNOSIS — D631 Anemia in chronic kidney disease: Secondary | ICD-10-CM | POA: Diagnosis not present

## 2021-11-18 DIAGNOSIS — N189 Chronic kidney disease, unspecified: Secondary | ICD-10-CM | POA: Diagnosis not present

## 2021-11-18 DIAGNOSIS — D509 Iron deficiency anemia, unspecified: Secondary | ICD-10-CM | POA: Diagnosis not present

## 2021-11-18 DIAGNOSIS — N186 End stage renal disease: Secondary | ICD-10-CM | POA: Diagnosis not present

## 2021-11-18 DIAGNOSIS — N2581 Secondary hyperparathyroidism of renal origin: Secondary | ICD-10-CM | POA: Diagnosis not present

## 2021-11-18 DIAGNOSIS — Z992 Dependence on renal dialysis: Secondary | ICD-10-CM | POA: Diagnosis not present

## 2021-11-20 DIAGNOSIS — N2581 Secondary hyperparathyroidism of renal origin: Secondary | ICD-10-CM | POA: Diagnosis not present

## 2021-11-20 DIAGNOSIS — N186 End stage renal disease: Secondary | ICD-10-CM | POA: Diagnosis not present

## 2021-11-20 DIAGNOSIS — N189 Chronic kidney disease, unspecified: Secondary | ICD-10-CM | POA: Diagnosis not present

## 2021-11-20 DIAGNOSIS — D631 Anemia in chronic kidney disease: Secondary | ICD-10-CM | POA: Diagnosis not present

## 2021-11-20 DIAGNOSIS — D509 Iron deficiency anemia, unspecified: Secondary | ICD-10-CM | POA: Diagnosis not present

## 2021-11-20 DIAGNOSIS — Z992 Dependence on renal dialysis: Secondary | ICD-10-CM | POA: Diagnosis not present

## 2021-11-22 DIAGNOSIS — D631 Anemia in chronic kidney disease: Secondary | ICD-10-CM | POA: Diagnosis not present

## 2021-11-22 DIAGNOSIS — N2581 Secondary hyperparathyroidism of renal origin: Secondary | ICD-10-CM | POA: Diagnosis not present

## 2021-11-22 DIAGNOSIS — Z992 Dependence on renal dialysis: Secondary | ICD-10-CM | POA: Diagnosis not present

## 2021-11-22 DIAGNOSIS — N189 Chronic kidney disease, unspecified: Secondary | ICD-10-CM | POA: Diagnosis not present

## 2021-11-22 DIAGNOSIS — D509 Iron deficiency anemia, unspecified: Secondary | ICD-10-CM | POA: Diagnosis not present

## 2021-11-22 DIAGNOSIS — N186 End stage renal disease: Secondary | ICD-10-CM | POA: Diagnosis not present

## 2021-11-23 DIAGNOSIS — N186 End stage renal disease: Secondary | ICD-10-CM | POA: Diagnosis not present

## 2021-11-23 DIAGNOSIS — Z992 Dependence on renal dialysis: Secondary | ICD-10-CM | POA: Diagnosis not present

## 2021-11-23 DIAGNOSIS — E1129 Type 2 diabetes mellitus with other diabetic kidney complication: Secondary | ICD-10-CM | POA: Diagnosis not present

## 2021-11-24 ENCOUNTER — Telehealth: Payer: Self-pay | Admitting: Internal Medicine

## 2021-11-25 DIAGNOSIS — N189 Chronic kidney disease, unspecified: Secondary | ICD-10-CM | POA: Diagnosis not present

## 2021-11-25 DIAGNOSIS — E1122 Type 2 diabetes mellitus with diabetic chronic kidney disease: Secondary | ICD-10-CM | POA: Diagnosis not present

## 2021-11-25 DIAGNOSIS — N186 End stage renal disease: Secondary | ICD-10-CM | POA: Diagnosis not present

## 2021-11-25 DIAGNOSIS — D631 Anemia in chronic kidney disease: Secondary | ICD-10-CM | POA: Diagnosis not present

## 2021-11-25 DIAGNOSIS — D509 Iron deficiency anemia, unspecified: Secondary | ICD-10-CM | POA: Diagnosis not present

## 2021-11-25 DIAGNOSIS — Z23 Encounter for immunization: Secondary | ICD-10-CM | POA: Diagnosis not present

## 2021-11-25 DIAGNOSIS — Z992 Dependence on renal dialysis: Secondary | ICD-10-CM | POA: Diagnosis not present

## 2021-11-25 DIAGNOSIS — N2581 Secondary hyperparathyroidism of renal origin: Secondary | ICD-10-CM | POA: Diagnosis not present

## 2021-11-27 DIAGNOSIS — N189 Chronic kidney disease, unspecified: Secondary | ICD-10-CM | POA: Diagnosis not present

## 2021-11-27 DIAGNOSIS — N186 End stage renal disease: Secondary | ICD-10-CM | POA: Diagnosis not present

## 2021-11-27 DIAGNOSIS — D509 Iron deficiency anemia, unspecified: Secondary | ICD-10-CM | POA: Diagnosis not present

## 2021-11-27 DIAGNOSIS — Z992 Dependence on renal dialysis: Secondary | ICD-10-CM | POA: Diagnosis not present

## 2021-11-27 DIAGNOSIS — N2581 Secondary hyperparathyroidism of renal origin: Secondary | ICD-10-CM | POA: Diagnosis not present

## 2021-11-27 DIAGNOSIS — D631 Anemia in chronic kidney disease: Secondary | ICD-10-CM | POA: Diagnosis not present

## 2021-11-29 DIAGNOSIS — N186 End stage renal disease: Secondary | ICD-10-CM | POA: Diagnosis not present

## 2021-11-29 DIAGNOSIS — N2581 Secondary hyperparathyroidism of renal origin: Secondary | ICD-10-CM | POA: Diagnosis not present

## 2021-11-29 DIAGNOSIS — Z992 Dependence on renal dialysis: Secondary | ICD-10-CM | POA: Diagnosis not present

## 2021-11-29 DIAGNOSIS — D509 Iron deficiency anemia, unspecified: Secondary | ICD-10-CM | POA: Diagnosis not present

## 2021-11-29 DIAGNOSIS — N189 Chronic kidney disease, unspecified: Secondary | ICD-10-CM | POA: Diagnosis not present

## 2021-11-29 DIAGNOSIS — D631 Anemia in chronic kidney disease: Secondary | ICD-10-CM | POA: Diagnosis not present

## 2021-12-01 ENCOUNTER — Encounter: Payer: Self-pay | Admitting: Internal Medicine

## 2021-12-01 NOTE — Progress Notes (Deleted)
[facility] Consult Note Telephone: [phone]  Fax: [phone]   Date of encounter: 12/01/21 9:13 AM PATIENT NAME: Bailey [name] [id number]   [phone] (home)  DOB: May 08, 1950 MRN: [id number] PRIMARY CARE PROVIDER:    [name], MD,  [address] [place] [address] [phone]  REFERRING PROVIDER:   [name], MD [address] [place],  [place] [address] [phone]  RESPONSIBLE PARTY:    Contact Information     Name Relation Home Work Mobile   Toran,Donald Spouse   [phone]   Raylin, Diguglielmo [phone]  [phone]   Mcgourty,(DIL)Tammy Relative [phone]  [phone]   [place] Daughter   [phone]        Due to the COVID-19 crisis, this visit was done via telemedicine from my office and it was initiated and consent by this patient and or family.  I connected with  [facility] on 12/01/21 by a telemedicine application and verified that I am speaking with the correct person using two identifiers.  This patient did not have the ability to connect via a video-enabled capacity.   I discussed the limitations of evaluation and management by telemedicine. The patient expressed understanding and agreed to proceed.  Palliative Care was asked to follow this patient by consultation request of  [name], MD to address advance care planning and complex medical decision making. This is the initial visit.                                     ASSESSMENT AND PLAN / RECOMMENDATIONS:   Advance Care Planning/Goals of Care: Goals include to maximize quality of life and symptom management. Patient/health care surrogate gave his/her permission to discuss.Our advance care planning conversation included a discussion about:    The value and importance of advance care planning  Experiences with loved ones who have been seriously ill or have died   Exploration of personal, cultural or spiritual beliefs that might influence medical decisions  Exploration of goals of care in the event of a sudden injury or illness  Identification  of a healthcare agent  Review and updating or creation of an  advance directive document . Decision not to resuscitate or to de-escalate disease focused treatments due to poor prognosis. CODE STATUS:  Symptom Management/Plan:    Follow up Palliative Care Visit: Palliative care will continue to follow for complex medical decision making, advance care planning, and clarification of goals. Return ***  I spent *** minutes providing this consultation. More than 50% of the time in this consultation was spent in counseling and care coordination.  This visit was coded based on medical decision making (MDM).***  PPS: ***0%  HOSPICE ELIGIBILITY/DIAGNOSIS: ***/***  Chief Complaint: ***  HISTORY OF PRESENT ILLNESS:  DEMMI SINDT is a 71 y.o. year old female  with ESRD on HD, recent fall with thumb fracture, anemia and progressive memory loss recently.  She was referred by Dr. Ernie Hew.  I spoke with her husband, Elenore Rota, today by phone.  She has lost 8 lbs since March.    History obtained from review of EMR, discussion with primary team, and interview with family, facility staff/caregiver and/or Ms. Suzie Portela.   I reviewed available labs, medications, imaging, studies and related documents from the EMR.  Records reviewed and summarized above.   ROS  Review of Systems  Physical Exam: Current and past weights: There were no vitals filed for this visit. There is no height or weight on file to calculate BMI. Wt Readings from Last 500 Encounters:  07/27/21 114 lb (51.7 kg)  05/23/21 122 lb 1.6 oz (55.4 kg)  05/10/21 124 lb 9 oz (56.5 kg)  04/29/21 124 lb 9 oz (56.5 kg)  04/23/21 115 lb (52.2 kg)  03/22/20 116 lb (52.6 kg)  10/18/19 117 lb 9.6 oz (53.3 kg)  10/10/19 115 lb (52.2 kg)  10/08/19 115 lb 15.4 oz  (52.6 kg)  09/08/19 115 lb 15.4 oz (52.6 kg)  08/20/19 130 lb 1.1 oz (59 kg)  07/18/19 122 lb 3.2 oz (55.4 kg)  07/12/19 123 lb 14.4 oz (56.2 kg)  06/08/19 125 lb (56.7 kg)  05/25/19 121 lb 12.8 oz (55.2 kg)  05/23/19 120 lb (54.4 kg)  04/25/19 121 lb 11.2 oz (55.2 kg)  03/27/19 122 lb 6.4 oz (55.5 kg)  02/13/19 127 lb 3.2 oz (57.7 kg)  01/30/19 130 lb 6.4 oz (59.1 kg)  01/20/19 127 lb (57.6 kg)  01/02/19 133 lb 9.6 oz (60.6 kg)  11/16/18 143 lb 4.8 oz (65 kg)  11/01/18 128 lb (58.1 kg)  08/09/18 137 lb 4.8 oz (62.3 kg)  04/25/18 143 lb 4.8 oz (65 kg)  01/31/18 134 lb 4.8 oz (60.9 kg)  12/21/17 133 lb 12.8 oz (60.7 kg)  10/11/17 130 lb 1.6 oz (59 kg)  07/22/17 128 lb 6 oz (58.2 kg)  07/08/17 129 lb 12.8 oz (58.9 kg)  06/09/17 130 lb 9.6 oz (59.2 kg)  06/02/17 125 lb 12.8 oz (57.1 kg)  09/02/16 168 lb (76.2 kg)  08/19/16 168 lb (76.2 kg)  08/14/16 168 lb (76.2 kg)  08/12/16 168 lb (76.2 kg)  07/24/16 168 lb (76.2 kg)  05/15/16 168 lb 10.4 oz (76.5 kg)  02/12/14 163 lb 9.3 oz (74.2 kg)  02/10/14 149 lb 6 oz (67.8 kg)  01/22/11 150 lb (68 kg)   Physical Exam  CURRENT PROBLEM LIST:  Patient Active Problem List   Diagnosis Date Noted   Pyelonephritis 04/27/2021   Malnutrition of moderate degree 09/05/2019   Hypokalemia 09/02/2019   Fever of unknown origin 09/02/2019   ESRD on dialysis ([facility]) 08/20/2019   Benign essential HTN 08/20/2019   Symptomatic anemia 08/20/2019   Thrombocytopenia ([facility]) 08/20/2019   CKD (chronic kidney disease) 05/23/2019   Type II diabetes mellitus, uncontrolled 12/07/2017   History of anemia due to chronic kidney disease 10/11/2017   Vitamin B 12 deficiency 06/09/2017   Great toe amputation status, right 09/04/2016   Osteomyelitis of toe of right foot ([facility])    Acute renal failure (ARF) ([facility]) 05/10/2016   SBO (small bowel obstruction) ([facility]) 05/08/2016   Diverticular disease of jejunum s/p resection 05/09/2016 05/08/2016   Normocytic anemia  05/05/2016   PAST MEDICAL HISTORY:  Active Ambulatory Problems    Diagnosis Date Noted   Normocytic anemia 05/05/2016   SBO (small bowel obstruction) ([facility]) 05/08/2016   Diverticular disease of jejunum s/p resection 05/09/2016 05/08/2016   Acute renal failure (ARF) ([facility]) 05/10/2016   Type II diabetes mellitus, uncontrolled 12/07/2017   Osteomyelitis of toe of right foot ([facility])    Great toe amputation status, right 09/04/2016   Vitamin B 12 deficiency 06/09/2017   History of anemia due to chronic kidney disease 10/11/2017   CKD (chronic kidney disease) 05/23/2019   ESRD on dialysis ([facility]) 08/20/2019   Benign essential HTN 08/20/2019   Symptomatic anemia  08/20/2019   Thrombocytopenia ([facility]) 08/20/2019   Hypokalemia 09/02/2019   Fever of unknown origin 09/02/2019   Malnutrition of moderate degree 09/05/2019   Pyelonephritis 04/27/2021   Resolved Ambulatory Problems    Diagnosis Date Noted   Poorly controlled type 2 diabetes mellitus with complication (HCC)    DKA (diabetic ketoacidoses) ([facility]) 02/10/2014   Abscess of buttock, left 02/10/2014   Sepsis ([facility]) 02/10/2014   Mass of small intestine s/p SB resection 05/09/2016 05/05/2016   UTI (urinary tract infection) 05/06/2016   Dehydration    Perforated viscus    Past Medical History:  Diagnosis Date   Anemia    ARF (acute renal failure) ([facility]) 04/2016   Chronic kidney disease    Depression    Diabetes mellitus    Diabetic retinopathy (HCC)    Diverticulitis    History of kidney stones    Hypertension    Osteomyelitis (HCC)    Vitamin D deficiency    Wears partial dentures    SOCIAL HX:  Social History   Tobacco Use   Smoking status: Never   Smokeless tobacco: Never  Substance Use Topics   Alcohol use: No    Alcohol/week: 0.0 standard drinks of alcohol   FAMILY HX:  Family History  Problem Relation Age of Onset   Diabetes Mother    Asthma Sister       ALLERGIES: No Known Allergies    PERTINENT MEDICATIONS:   Outpatient Encounter Medications as of 12/01/2021  Medication Sig   acetaminophen (TYLENOL) 500 MG tablet Take 1,000 mg by mouth every 6 (six) hours as needed for fever or headache (pain).    amLODipine (NORVASC) 5 MG tablet Take 1/2 tablet (2.5 mg total) by mouth at bedtime.   AURYXIA 1 GM 210 MG(Fe) tablet Take 210 mg by mouth 3 (three) times daily.   carvedilol (COREG) 3.125 MG tablet Take 1 tablet (3.125 mg total) by mouth 2 (two) times daily with a meal. (Patient taking differently: Take 6.25 mg by mouth 2 (two) times daily with a meal.)   ciprofloxacin (CIPRO) 250 MG tablet Take 250 mg by mouth See admin instructions. Bid x 3 days (Patient not taking: Reported on 05/10/2021)   Ibuprofen-Acetaminophen (ADVIL DUAL ACTION) 125-250 MG TABS Take 250-500 mg by mouth daily as needed (pain).   lidocaine (LIDODERM) 5 % Place 1 patch onto the skin daily. Remove & Discard patch within 12 hours or as directed by MD (Patient not taking: Reported on 05/10/2021)   No facility-administered encounter medications on file as of 12/01/2021.    Thank you for the opportunity to participate in the care of Ms. Suzie Portela.  The palliative care team will continue to follow. Please call our office at [phone] if we can be of additional assistance.   Hollace Kinnier, DO  COVID-19 PATIENT SCREENING TOOL Asked and negative response unless otherwise noted:  Have you had symptoms of covid, tested positive or been in contact with someone with symptoms/positive test in the past 5-10 days?  NO

## 2021-12-02 DIAGNOSIS — N2581 Secondary hyperparathyroidism of renal origin: Secondary | ICD-10-CM | POA: Diagnosis not present

## 2021-12-02 DIAGNOSIS — N186 End stage renal disease: Secondary | ICD-10-CM | POA: Diagnosis not present

## 2021-12-02 DIAGNOSIS — D509 Iron deficiency anemia, unspecified: Secondary | ICD-10-CM | POA: Diagnosis not present

## 2021-12-02 DIAGNOSIS — Z992 Dependence on renal dialysis: Secondary | ICD-10-CM | POA: Diagnosis not present

## 2021-12-02 DIAGNOSIS — N189 Chronic kidney disease, unspecified: Secondary | ICD-10-CM | POA: Diagnosis not present

## 2021-12-02 DIAGNOSIS — D631 Anemia in chronic kidney disease: Secondary | ICD-10-CM | POA: Diagnosis not present

## 2021-12-04 DIAGNOSIS — D631 Anemia in chronic kidney disease: Secondary | ICD-10-CM | POA: Diagnosis not present

## 2021-12-04 DIAGNOSIS — D509 Iron deficiency anemia, unspecified: Secondary | ICD-10-CM | POA: Diagnosis not present

## 2021-12-04 DIAGNOSIS — N2581 Secondary hyperparathyroidism of renal origin: Secondary | ICD-10-CM | POA: Diagnosis not present

## 2021-12-04 DIAGNOSIS — Z992 Dependence on renal dialysis: Secondary | ICD-10-CM | POA: Diagnosis not present

## 2021-12-04 DIAGNOSIS — N189 Chronic kidney disease, unspecified: Secondary | ICD-10-CM | POA: Diagnosis not present

## 2021-12-04 DIAGNOSIS — N186 End stage renal disease: Secondary | ICD-10-CM | POA: Diagnosis not present

## 2021-12-05 NOTE — Progress Notes (Signed)
This encounter was created in error - please disregard.

## 2021-12-06 DIAGNOSIS — N2581 Secondary hyperparathyroidism of renal origin: Secondary | ICD-10-CM | POA: Diagnosis not present

## 2021-12-06 DIAGNOSIS — N186 End stage renal disease: Secondary | ICD-10-CM | POA: Diagnosis not present

## 2021-12-06 DIAGNOSIS — Z992 Dependence on renal dialysis: Secondary | ICD-10-CM | POA: Diagnosis not present

## 2021-12-06 DIAGNOSIS — D631 Anemia in chronic kidney disease: Secondary | ICD-10-CM | POA: Diagnosis not present

## 2021-12-06 DIAGNOSIS — N189 Chronic kidney disease, unspecified: Secondary | ICD-10-CM | POA: Diagnosis not present

## 2021-12-06 DIAGNOSIS — D509 Iron deficiency anemia, unspecified: Secondary | ICD-10-CM | POA: Diagnosis not present

## 2021-12-09 DIAGNOSIS — N186 End stage renal disease: Secondary | ICD-10-CM | POA: Diagnosis not present

## 2021-12-09 DIAGNOSIS — N2581 Secondary hyperparathyroidism of renal origin: Secondary | ICD-10-CM | POA: Diagnosis not present

## 2021-12-09 DIAGNOSIS — D509 Iron deficiency anemia, unspecified: Secondary | ICD-10-CM | POA: Diagnosis not present

## 2021-12-09 DIAGNOSIS — D631 Anemia in chronic kidney disease: Secondary | ICD-10-CM | POA: Diagnosis not present

## 2021-12-09 DIAGNOSIS — N189 Chronic kidney disease, unspecified: Secondary | ICD-10-CM | POA: Diagnosis not present

## 2021-12-09 DIAGNOSIS — Z992 Dependence on renal dialysis: Secondary | ICD-10-CM | POA: Diagnosis not present

## 2021-12-10 ENCOUNTER — Other Ambulatory Visit: Payer: Self-pay

## 2021-12-10 ENCOUNTER — Encounter: Payer: Self-pay | Admitting: Hematology and Oncology

## 2021-12-10 ENCOUNTER — Other Ambulatory Visit: Payer: Self-pay | Admitting: Cardiology

## 2021-12-10 ENCOUNTER — Encounter: Payer: Self-pay | Admitting: Cardiology

## 2021-12-10 ENCOUNTER — Ambulatory Visit: Payer: Medicare Other | Admitting: Cardiology

## 2021-12-10 VITALS — BP 180/73 | HR 66 | Temp 97.6°F | Resp 16 | Ht 64.0 in | Wt 120.8 lb

## 2021-12-10 DIAGNOSIS — I700 Atherosclerosis of aorta: Secondary | ICD-10-CM

## 2021-12-10 DIAGNOSIS — E119 Type 2 diabetes mellitus without complications: Secondary | ICD-10-CM | POA: Diagnosis not present

## 2021-12-10 DIAGNOSIS — I120 Hypertensive chronic kidney disease with stage 5 chronic kidney disease or end stage renal disease: Secondary | ICD-10-CM

## 2021-12-10 DIAGNOSIS — R0989 Other specified symptoms and signs involving the circulatory and respiratory systems: Secondary | ICD-10-CM

## 2021-12-10 DIAGNOSIS — R011 Cardiac murmur, unspecified: Secondary | ICD-10-CM

## 2021-12-10 MED ORDER — ISOSORBIDE DINITRATE 20 MG PO TABS: 20.0000 mg | ORAL_TABLET | Freq: Three times a day (TID) | ORAL | 0 refills | Status: DC

## 2021-12-10 MED ORDER — HYDRALAZINE HCL 25 MG PO TABS: 25.0000 mg | ORAL_TABLET | Freq: Three times a day (TID) | ORAL | 0 refills | Status: DC

## 2021-12-10 MED ORDER — ISOSORB DINITRATE-HYDRALAZINE 20-37.5 MG PO TABS: 1.0000 | ORAL_TABLET | Freq: Three times a day (TID) | ORAL | 2 refills | Status: DC

## 2021-12-10 NOTE — Progress Notes (Signed)
ID:  Bailey [name], DOB 1950-04-17, MRN [id number]  PCP:  Bailey Bien, MD  Cardiologist:  Bailey Kras, DO, [facility] (established care 12/10/2021)  REASON FOR CONSULT: Cardiac murmur  REQUESTING PHYSICIAN:  Bailey Bien, MD [place],  [place] [address]  Chief Complaint  Patient presents with   Heart Murmur   New Patient (Initial Visit)    HPI  Bailey [name] is a 71 y.o. Caucasian female who presents to the clinic for evaluation of cardiac murmur at the request of Bailey Bien, MD. Her past medical history and cardiovascular risk factors include: Diabetes mellitus type 2, hypertension, GERD, ESRD (as of 2022 TTS), aortic atherosclerosis, nephrolithiasis.  This patient is accompanied in the office by her  son, Bailey [name] . Bailey [name] provides verbal consent with regards to having him present during today's encounter.  Patient has been on dialysis since 2022 and recently was evaluated by her PCP.  There was a concern for a cardiac murmur and therefore referred to cardiology for further evaluation and management.  She denies anginal discomfort or heart failure symptoms.  Her overall functional capacity is very limited and therefore unable to comment on exertional related symptoms.  Her BP is elevated - no reading at home.   FUNCTIONAL STATUS: No structured exercise program or daily routine.   ALLERGIES: No Known Allergies  MEDICATION LIST PRIOR TO VISIT: Current Meds  Medication Sig   acetaminophen (TYLENOL) 500 MG tablet Take 1,000 mg by mouth every 6 (six) hours as needed for fever or headache (pain).    amLODipine (NORVASC) 5 MG tablet Take 1/2 tablet (2.5 mg total) by mouth at bedtime.   AURYXIA 1 GM 210 MG(Fe) tablet Take 210 mg by mouth 3 (three) times daily.   carvedilol (COREG) 3.125 MG tablet Take 1 tablet (3.125 mg total) by mouth 2 (two) times daily with a meal. (Patient taking differently: Take 6.25 mg by mouth 2 (two) times daily with a  meal.)   isosorbide-hydrALAZINE (BIDIL) 20-37.5 MG tablet Take 1 tablet by mouth 3 (three) times daily.   lidocaine (LIDODERM) 5 % Place 1 patch onto the skin daily. Remove & Discard patch within 12 hours or as directed by MD     PAST MEDICAL HISTORY: Past Medical History:  Diagnosis Date   Anemia    patient preference - stopped iron    ARF (acute renal failure) (Bailey [name]) 04/2016   dehydration   Chronic kidney disease    stage 5   Depression    Diabetes mellitus    Type II - pt preference - stopped lantus   Diabetic retinopathy ([facility])    Diverticulitis    History of kidney stones    passed- 6   Hypertension    Osteomyelitis ([facility])    Vitamin B 12 deficiency 06/09/2017   Vitamin D deficiency    Wears partial dentures    upper    PAST SURGICAL HISTORY: Past Surgical History:  Procedure Laterality Date   AMPUTATION Right 08/14/2016   Procedure: RIGHT 1ST RAY AMPUTATION MID-SHAFT;  Surgeon: Newt Minion, MD;  Location: [facility];  Service: Orthopedics;  Laterality: Right;   BASCILIC VEIN TRANSPOSITION Left 06/08/2019   Procedure: LEFT BRACHIOCEPHALIC VEIN CREATION;  Surgeon: Marty Heck, MD;  Location: [facility];  Service: Vascular;  Laterality: Left;   INCISION AND DRAINAGE ABSCESS Left 02/11/2014   Procedure: INCISION AND DRAINAGE ABSCESS Left Buttock;  Surgeon: Jackolyn Confer, MD;  Location: [facility];  Service: General;  Laterality: Left;   INSERTION OF DIALYSIS CATHETER Left 09/07/2019   Procedure: INSERTION OF LEFT INTERNAL JUGULAR DIALYSIS CATHETER;  Surgeon: Rosetta Posner, MD;  Location: [facility];  Service: Vascular;  Laterality: Left;   IR REMOVAL TUN CV CATH W/O FL  09/04/2019   LAPAROSCOPIC SMALL BOWEL RESECTION N/A 05/09/2016   Procedure: LAPAROSCOPIC SMALL BOWEL RESECTION;  Surgeon: Bailey Boston, MD;  Location: [facility];  Service: General;  Laterality: N/A;   LAPAROSCOPY N/A 05/09/2016   Procedure: LAPAROSCOPY DIAGNOSTIC, LYSIS OF ADHESIONS, SMALL BOWEL RESECTION X 2;  Surgeon:  Bailey Boston, MD;  Location: [facility];  Service: General;  Laterality: N/A;   TOE AMPUTATION     left foot great toe    FAMILY HISTORY: The patient family history includes Asthma in her sister; Diabetes in her mother.  SOCIAL HISTORY:  The patient  reports that she has never smoked. She has never used smokeless tobacco. She reports that she does not drink alcohol and does not use drugs.  REVIEW OF SYSTEMS: Review of Systems  Cardiovascular:  Negative for chest pain, claudication, dyspnea on exertion, irregular heartbeat, leg swelling, near-syncope, orthopnea, palpitations, paroxysmal nocturnal dyspnea and syncope.  Respiratory:  Negative for shortness of breath.   Hematologic/Lymphatic: Negative for bleeding problem.  Musculoskeletal:  Negative for muscle cramps and myalgias.  Neurological:  Negative for dizziness and light-headedness.    PHYSICAL EXAM:    12/10/2021    9:34 AM 12/10/2021    9:27 AM 07/27/2021   11:10 AM  Vitals with BMI  Height  _0    Weight  120 lbs 13 oz   BMI  19.14   Systolic 782 956   Diastolic 73 70   Pulse 66 63 68    Physical Exam  Constitutional: No distress.  Age appropriate, hemodynamically stable.   Neck: No JVD present.  Cardiovascular: Normal rate, regular rhythm, S1 normal, S2 normal and intact distal pulses. Exam reveals no gallop, no S3 and no S4.  Pulses:      Carotid pulses are  on the right side with bruit and  on the left side with bruit.      Dorsalis pedis pulses are 0 on the right side and 0 on the left side.       Posterior tibial pulses are 0 on the right side and 0 on the left side.  Regular, positive S1, delayed S2, systolic ejection murmur heard at the second right intercostal space, holosystolic murmur heard at the apex. Bilateral carotids and subclavian bruits.  Pulmonary/Chest: Effort normal and breath sounds normal. No stridor. She has no wheezes. She has no rales.  Abdominal: Soft. Bowel sounds are normal. She exhibits  no distension. There is no abdominal tenderness.  Musculoskeletal:        General: No edema.     Cervical back: Neck supple.     Comments: Bilateral hallux amputation. Dry skin bilateral ankle and distally.  Dialysis access site in the left upper extremity.   Neurological: She is alert and oriented to person, place, and time. She has intact cranial nerves (2-12).  Skin: Skin is warm and moist.   CARDIAC DATABASE: EKG: 12/10/2021: Sinus rhythm, 65 bpm, LAE, R wave progression, without underlying ischemia or injury pattern.  Echocardiogram: No results found for this or any previous visit from the past 1095 days.    Stress Testing: No results found for this or any previous visit from the past 1095 days.   Heart Catheterization:  None  LABORATORY DATA: External Labs: Collected: 11/14/2021 provided by referring physician. Sodium 143, potassium 4.8, chloride 96, bicarb 34, BUN 24, creatinine 4.2. eGFR 10.6. AST 9, ALT 5, alkaline phosphatase 71. A1c 5.7. Hemoglobin 11.1, hematocrit 32.6. TSH 2.26   IMPRESSION:    ICD-10-CM   1. Heart murmur  R01.1 EKG 12-Lead    PCV ECHOCARDIOGRAM COMPLETE    2. Benign hypertensive kidney disease with chronic kidney disease stage V or end stage renal disease (HCC)  I12.0 isosorbide-hydrALAZINE (BIDIL) 20-37.5 MG tablet    3. Non-insulin dependent type 2 diabetes mellitus ([facility])  E11.9     4. Atherosclerosis of aorta (HCC)  I70.0     5. Bilateral carotid bruits  R09.89 PCV CAROTID DUPLEX (BILATERAL)       RECOMMENDATIONS: Bailey [name] is a 71 y.o. Caucasian female whose past medical history and cardiac risk factors include: Diabetes mellitus type 2, hypertension, GERD, ESRD (as of 2022 TTS), aortic atherosclerosis, nephrolithiasis.  Patient was referred to the practice for evaluation of a cardiac murmur.  On auscultation there are concerns for underlying aortic stenosis associated with mitral regurgitation as well.  However, she denies  any symptoms of anginal discomfort, heart failure, near-syncope or syncope.  She has not had a cardiovascular evaluation to the best of her memory.  She also has hypertension with end-stage renal disease on hemodialysis.  Her blood pressures are not well controlled and her systolic blood pressures are greater than 180 mmHg at today's office and also at PCPs office based on the records provided.  For now I will start her on BiDil and have asked her to discuss blood pressure management with nephrology at their next encounter.  On physical examination she also has bilateral carotids as well as subclavian bruits.  We will check carotid duplex for further evaluation and management.  Further recommendations to follow.  FINAL MEDICATION LIST END OF ENCOUNTER: Meds ordered this encounter  Medications   isosorbide-hydrALAZINE (BIDIL) 20-37.5 MG tablet    Sig: Take 1 tablet by mouth 3 (three) times daily.    Dispense:  90 tablet    Refill:  2    Medications Discontinued During This Encounter  Medication Reason   Ibuprofen-Acetaminophen (ADVIL DUAL ACTION) 125-250 MG TABS    ciprofloxacin (CIPRO) 250 MG tablet      Current Outpatient Medications:    acetaminophen (TYLENOL) 500 MG tablet, Take 1,000 mg by mouth every 6 (six) hours as needed for fever or headache (pain). , Disp: , Rfl:    amLODipine (NORVASC) 5 MG tablet, Take 1/2 tablet (2.5 mg total) by mouth at bedtime., Disp: 30 tablet, Rfl: 3   AURYXIA 1 GM 210 MG(Fe) tablet, Take 210 mg by mouth 3 (three) times daily., Disp: , Rfl:    carvedilol (COREG) 3.125 MG tablet, Take 1 tablet (3.125 mg total) by mouth 2 (two) times daily with a meal. (Patient taking differently: Take 6.25 mg by mouth 2 (two) times daily with a meal.), Disp: 60 tablet, Rfl: 1   isosorbide-hydrALAZINE (BIDIL) 20-37.5 MG tablet, Take 1 tablet by mouth 3 (three) times daily., Disp: 90 tablet, Rfl: 2   lidocaine (LIDODERM) 5 %, Place 1 patch onto the skin daily. Remove &  Discard patch within 12 hours or as directed by MD, Disp: 30 patch, Rfl: 0  Orders Placed This Encounter  Procedures   EKG 12-Lead   PCV ECHOCARDIOGRAM COMPLETE   PCV CAROTID DUPLEX (BILATERAL)    There are no Patient Instructions on  file for this visit.   --Continue cardiac medications as reconciled in final medication list. --Return in about 8 weeks (around 02/04/2022) for Monitor for cardiac murmur, discuss test results. or sooner if needed. --Continue follow-up with your primary care physician regarding the management of your other chronic comorbid conditions.  Patient's questions and concerns were addressed to her satisfaction. She voices understanding of the instructions provided during this encounter.   This note was created using a voice recognition software as a result there may be grammatical errors inadvertently enclosed that do not reflect the nature of this encounter. Every attempt is made to correct such errors.  Bailey [name], [place], [facility]  Pager: [phone] Office: [phone]

## 2021-12-11 DIAGNOSIS — N186 End stage renal disease: Secondary | ICD-10-CM | POA: Diagnosis not present

## 2021-12-11 DIAGNOSIS — N189 Chronic kidney disease, unspecified: Secondary | ICD-10-CM | POA: Diagnosis not present

## 2021-12-11 DIAGNOSIS — N2581 Secondary hyperparathyroidism of renal origin: Secondary | ICD-10-CM | POA: Diagnosis not present

## 2021-12-11 DIAGNOSIS — D631 Anemia in chronic kidney disease: Secondary | ICD-10-CM | POA: Diagnosis not present

## 2021-12-11 DIAGNOSIS — Z992 Dependence on renal dialysis: Secondary | ICD-10-CM | POA: Diagnosis not present

## 2021-12-11 DIAGNOSIS — D509 Iron deficiency anemia, unspecified: Secondary | ICD-10-CM | POA: Diagnosis not present

## 2021-12-12 ENCOUNTER — Ambulatory Visit: Payer: Medicare Other

## 2021-12-12 DIAGNOSIS — R011 Cardiac murmur, unspecified: Secondary | ICD-10-CM

## 2021-12-12 DIAGNOSIS — R0989 Other specified symptoms and signs involving the circulatory and respiratory systems: Secondary | ICD-10-CM | POA: Diagnosis not present

## 2021-12-12 DIAGNOSIS — I10 Essential (primary) hypertension: Secondary | ICD-10-CM | POA: Diagnosis not present

## 2021-12-12 DIAGNOSIS — I350 Nonrheumatic aortic (valve) stenosis: Secondary | ICD-10-CM

## 2021-12-13 DIAGNOSIS — N189 Chronic kidney disease, unspecified: Secondary | ICD-10-CM | POA: Diagnosis not present

## 2021-12-13 DIAGNOSIS — D631 Anemia in chronic kidney disease: Secondary | ICD-10-CM | POA: Diagnosis not present

## 2021-12-13 DIAGNOSIS — Z992 Dependence on renal dialysis: Secondary | ICD-10-CM | POA: Diagnosis not present

## 2021-12-13 DIAGNOSIS — N2581 Secondary hyperparathyroidism of renal origin: Secondary | ICD-10-CM | POA: Diagnosis not present

## 2021-12-13 DIAGNOSIS — D509 Iron deficiency anemia, unspecified: Secondary | ICD-10-CM | POA: Diagnosis not present

## 2021-12-13 DIAGNOSIS — N186 End stage renal disease: Secondary | ICD-10-CM | POA: Diagnosis not present

## 2021-12-15 ENCOUNTER — Encounter: Payer: Self-pay | Admitting: Internal Medicine

## 2021-12-15 NOTE — Progress Notes (Deleted)
[facility] Consult Note Telephone: [phone]  Fax: [phone]   Date of encounter: 12/15/21 8:51 AM PATIENT NAME: Bailey [name] [id number]   [phone] (home)  DOB: 01/30/1951 MRN: [id number] PRIMARY CARE PROVIDER:    [name], MD,  [address] [place] [address] [phone]  REFERRING PROVIDER:   [name], MD [address] [place],  [place] [address] [phone]  RESPONSIBLE PARTY:    Contact Information     Name Relation Home Work Mobile   Bailey [name] Spouse   [phone]   Bailey [name] [phone]  [phone]   Bailey [name] Relative [phone]  [phone]   Bailey [name]   [phone]        Due to the COVID-19 crisis, this visit was done via telemedicine from my office and it was initiated and consent by this patient and or family.  I connected with  [place] [employer] on 12/15/21 by a telemedicine application and verified that I am speaking with the correct person using two identifiers.  This patient did not have the ability to connect via a video-enabled capacity.   I discussed the limitations of evaluation and management by telemedicine. The patient expressed understanding and agreed to proceed.  Palliative Care was asked to follow this patient by consultation request of  Bailey Bien, MD to address advance care planning and complex medical decision making. This is the initial visit.                                     ASSESSMENT AND PLAN / RECOMMENDATIONS:   Advance Care Planning/Goals of Care: Goals include to maximize quality of life and symptom management. Patient/health care surrogate gave his/her permission to discuss.Our advance care planning conversation included a discussion about:    The value and importance of advance care planning  Experiences with loved ones who have been seriously ill or have died   Exploration of personal, cultural or spiritual beliefs that might influence medical decisions  Exploration of goals of care in the event of a sudden injury or illness  Identification  of a healthcare agent  Review and updating or creation of an  advance directive document . Decision not to resuscitate or to de-escalate disease focused treatments due to poor prognosis. CODE STATUS:  Symptom Management/Plan:    Follow up Palliative Care Visit: Palliative care will continue to follow for complex medical decision making, advance care planning, and clarification of goals. Return ***  I spent *** minutes providing this consultation. More than 50% of the time in this consultation was spent in counseling and care coordination.  This visit was coded based on medical decision making (MDM).***  PPS: ***0%  HOSPICE ELIGIBILITY/DIAGNOSIS: ***/***  Chief Complaint: Initial palliative consult--telephone  HISTORY OF PRESENT ILLNESS:  Bailey [name] is a 71 y.o. year old female  with ESRD on HD, fall with thumb fracture, DMII, atherosclerotic carotid disease and carotid bruits, dementia and anemia--I spoke with her husband, Bailey [name], by phone.   History obtained from review of EMR, discussion with primary team, and interview with family, facility staff/caregiver and/or Bailey [name].   I reviewed available labs, medications, imaging, studies and related documents from the EMR.  Records reviewed and summarized above.   ROS  Review of Systems  Physical Exam: Current and past weights: There were no vitals filed for this visit.  There is no height or weight on file to calculate BMI. Wt Readings from Last 500 Encounters:  12/10/21 120 lb 12.8 oz (54.8 kg)  07/27/21 114 lb (51.7 kg)  05/23/21 122 lb 1.6 oz (55.4 kg)  05/10/21 124 lb 9 oz (56.5 kg)  04/29/21 124 lb 9 oz (56.5 kg)  04/23/21 115 lb (52.2 kg)  03/22/20 116 lb (52.6 kg)  10/18/19 117 lb 9.6 oz (53.3 kg)  10/10/19 115 lb (52.2 kg)   10/08/19 115 lb 15.4 oz (52.6 kg)  09/08/19 115 lb 15.4 oz (52.6 kg)  08/20/19 130 lb 1.1 oz (59 kg)  07/18/19 122 lb 3.2 oz (55.4 kg)  07/12/19 123 lb 14.4 oz (56.2 kg)  06/08/19 125 lb (56.7 kg)  05/25/19 121 lb 12.8 oz (55.2 kg)  05/23/19 120 lb (54.4 kg)  04/25/19 121 lb 11.2 oz (55.2 kg)  03/27/19 122 lb 6.4 oz (55.5 kg)  02/13/19 127 lb 3.2 oz (57.7 kg)  01/30/19 130 lb 6.4 oz (59.1 kg)  01/20/19 127 lb (57.6 kg)  01/02/19 133 lb 9.6 oz (60.6 kg)  11/16/18 143 lb 4.8 oz (65 kg)  11/01/18 128 lb (58.1 kg)  08/09/18 137 lb 4.8 oz (62.3 kg)  04/25/18 143 lb 4.8 oz (65 kg)  01/31/18 134 lb 4.8 oz (60.9 kg)  12/21/17 133 lb 12.8 oz (60.7 kg)  10/11/17 130 lb 1.6 oz (59 kg)  07/22/17 128 lb 6 oz (58.2 kg)  07/08/17 129 lb 12.8 oz (58.9 kg)  06/09/17 130 lb 9.6 oz (59.2 kg)  06/02/17 125 lb 12.8 oz (57.1 kg)  09/02/16 168 lb (76.2 kg)  08/19/16 168 lb (76.2 kg)  08/14/16 168 lb (76.2 kg)  08/12/16 168 lb (76.2 kg)  07/24/16 168 lb (76.2 kg)  05/15/16 168 lb 10.4 oz (76.5 kg)  02/12/14 163 lb 9.3 oz (74.2 kg)  02/10/14 149 lb 6 oz (67.8 kg)  01/22/11 150 lb (68 kg)   Physical Exam  CURRENT PROBLEM LIST:  Patient Active Problem List   Diagnosis Date Noted   Pyelonephritis 04/27/2021   Malnutrition of moderate degree 09/05/2019   Hypokalemia 09/02/2019   Fever of unknown origin 09/02/2019   ESRD on dialysis ([facility]) 08/20/2019   Benign essential HTN 08/20/2019   Symptomatic anemia 08/20/2019   Thrombocytopenia ([facility]) 08/20/2019   CKD (chronic kidney disease) 05/23/2019   Type II diabetes mellitus, uncontrolled 12/07/2017   History of anemia due to chronic kidney disease 10/11/2017   Vitamin B 12 deficiency 06/09/2017   Great toe amputation status, right 09/04/2016   Osteomyelitis of toe of right foot ([facility])    Acute renal failure (ARF) ([facility]) 05/10/2016   SBO (small bowel obstruction) ([facility]) 05/08/2016   Diverticular disease of jejunum s/p resection 05/09/2016 05/08/2016    Normocytic anemia 05/05/2016   PAST MEDICAL HISTORY:  Active Ambulatory Problems    Diagnosis Date Noted   Normocytic anemia 05/05/2016   SBO (small bowel obstruction) ([facility]) 05/08/2016   Diverticular disease of jejunum s/p resection 05/09/2016 05/08/2016   Acute renal failure (ARF) ([facility]) 05/10/2016   Type II diabetes mellitus, uncontrolled 12/07/2017   Osteomyelitis of toe of right foot ([facility])    Great toe amputation status, right 09/04/2016   Vitamin B 12 deficiency 06/09/2017   History of anemia due to chronic kidney disease 10/11/2017   CKD (chronic kidney disease) 05/23/2019   ESRD on dialysis ([facility]) 08/20/2019   Benign essential HTN 08/20/2019   Symptomatic anemia 08/20/2019   Thrombocytopenia ([facility]) 08/20/2019  Hypokalemia 09/02/2019   Fever of unknown origin 09/02/2019   Malnutrition of moderate degree 09/05/2019   Pyelonephritis 04/27/2021   Resolved Ambulatory Problems    Diagnosis Date Noted   Poorly controlled type 2 diabetes mellitus with complication (HCC)    DKA (diabetic ketoacidoses) ([facility]) 02/10/2014   Abscess of buttock, left 02/10/2014   Sepsis ([facility]) 02/10/2014   Mass of small intestine s/p SB resection 05/09/2016 05/05/2016   UTI (urinary tract infection) 05/06/2016   Dehydration    Perforated viscus    Past Medical History:  Diagnosis Date   Anemia    ARF (acute renal failure) ([facility]) 04/2016   Chronic kidney disease    Depression    Diabetes mellitus    Diabetic retinopathy (HCC)    Diverticulitis    History of kidney stones    Hypertension    Osteomyelitis (HCC)    Vitamin D deficiency    Wears partial dentures    SOCIAL HX:  Social History   Tobacco Use   Smoking status: Never   Smokeless tobacco: Never  Substance Use Topics   Alcohol use: No    Alcohol/week: 0.0 standard drinks of alcohol   FAMILY HX:  Family History  Problem Relation Age of Onset   Diabetes Mother    Asthma Sister       ALLERGIES: No Known Allergies     PERTINENT MEDICATIONS:  Outpatient Encounter Medications as of 12/15/2021  Medication Sig   acetaminophen (TYLENOL) 500 MG tablet Take 1,000 mg by mouth every 6 (six) hours as needed for fever or headache (pain).    amLODipine (NORVASC) 5 MG tablet Take 1/2 tablet (2.5 mg total) by mouth at bedtime.   AURYXIA 1 GM 210 MG(Fe) tablet Take 210 mg by mouth 3 (three) times daily.   carvedilol (COREG) 3.125 MG tablet Take 1 tablet (3.125 mg total) by mouth 2 (two) times daily with a meal. (Patient taking differently: Take 6.25 mg by mouth 2 (two) times daily with a meal.)   hydrALAZINE (APRESOLINE) 25 MG tablet TAKE 1 TABLET(25 MG) BY MOUTH THREE TIMES DAILY   isosorbide dinitrate (ISORDIL) 20 MG tablet TAKE 1 TABLET(20 MG) BY MOUTH THREE TIMES DAILY   lidocaine (LIDODERM) 5 % Place 1 patch onto the skin daily. Remove & Discard patch within 12 hours or as directed by MD   No facility-administered encounter medications on file as of 12/15/2021.    Thank you for the opportunity to participate in the care of Bailey [name].  The palliative care team will continue to follow. Please call our office at [phone] if we can be of additional assistance.   Hollace Kinnier, DO  COVID-19 PATIENT SCREENING TOOL Asked and negative response unless otherwise noted:  Have you had symptoms of covid, tested positive or been in contact with someone with symptoms/positive test in the past 5-10 days?  NO

## 2021-12-15 NOTE — Progress Notes (Deleted)
[facility] Consult Note Telephone: [phone]  Fax: [phone]   Date of encounter: 12/15/21 10:40 AM PATIENT NAME: Bailey [name] [id number]   [phone] (home)  DOB: 08-07-1950 MRN: [id number] PRIMARY CARE PROVIDER:    [name], MD,  [address] [place] [address] [phone]  REFERRING PROVIDER:   [name], MD [address] [place],  [place] [address] [phone]  RESPONSIBLE PARTY:    Contact Information     Name Relation Home Work Mobile   Row,Donald Spouse   [phone]   Bailey [name], Bailey [name] [phone]  [phone]   [name],(DIL)Bailey Relative [phone]  [phone]   [place] [place] Daughter   [phone]        Due to the COVID-19 crisis, this visit was done via telemedicine from my office and it was initiated and consent by this patient and or family.  I connected with  [facility] on 12/15/21 by a telemedicine application and verified that I am speaking with the correct person using two identifiers.  This patient did not have the ability to connect via a video-enabled capacity.   I discussed the limitations of evaluation and management by telemedicine. The patient expressed understanding and agreed to proceed.  Palliative Care was asked to follow this patient by consultation request of  [name], MD to address advance care planning and complex medical decision making. This is the initial visit.                                     ASSESSMENT AND PLAN / RECOMMENDATIONS:   Advance Care Planning/Goals of Care: Goals include to maximize quality of life and symptom management. Patient/health care surrogate gave his/her permission to discuss.Our advance care planning conversation included a discussion about:    The value and importance of advance care planning  Experiences with loved ones who have been seriously ill or have died   Exploration of personal, cultural or spiritual beliefs that might influence medical decisions  Exploration of goals of care in the event of a sudden injury or illness  Identification  of a healthcare agent  Review and updating or creation of an  advance directive document . Decision not to resuscitate or to de-escalate disease focused treatments due to poor prognosis. CODE STATUS:  Symptom Management/Plan:    Follow up Palliative Care Visit: Palliative care will continue to follow for complex medical decision making, advance care planning, and clarification of goals. Return ***  I spent *** minutes providing this consultation. More than 50% of the time in this consultation was spent in counseling and care coordination.  This visit was coded based on medical decision making (MDM).***  PPS: ***0%  HOSPICE ELIGIBILITY/DIAGNOSIS: ***/***  Chief Complaint: Initial palliative consult--telephone  HISTORY OF PRESENT ILLNESS:  CYANI KALLSTROM is a 71 y.o. year old female  with ESRD on HD, fall with thumb fracture, DMII, atherosclerotic carotid disease and carotid bruits, dementia and anemia--I spoke with her husband, Bailey [name], by phone.   History obtained from review of EMR, discussion with primary team, and interview with family, facility staff/caregiver and/or Bailey [name].   I reviewed available labs, medications, imaging, studies and related documents from the EMR.  Records reviewed and summarized above.   ROS  Review of Systems  Physical Exam: Current and past weights: There were no vitals filed for this visit.  There is no height or weight on file to calculate BMI. Wt Readings from Last 500 Encounters:  12/10/21 120 lb 12.8 oz (54.8 kg)  07/27/21 114 lb (51.7 kg)  05/23/21 122 lb 1.6 oz (55.4 kg)  05/10/21 124 lb 9 oz (56.5 kg)  04/29/21 124 lb 9 oz (56.5 kg)  04/23/21 115 lb (52.2 kg)  03/22/20 116 lb (52.6 kg)  10/18/19 117 lb 9.6 oz (53.3 kg)  10/10/19 115 lb (52.2 kg)   10/08/19 115 lb 15.4 oz (52.6 kg)  09/08/19 115 lb 15.4 oz (52.6 kg)  08/20/19 130 lb 1.1 oz (59 kg)  07/18/19 122 lb 3.2 oz (55.4 kg)  07/12/19 123 lb 14.4 oz (56.2 kg)  06/08/19 125 lb (56.7 kg)  05/25/19 121 lb 12.8 oz (55.2 kg)  05/23/19 120 lb (54.4 kg)  04/25/19 121 lb 11.2 oz (55.2 kg)  03/27/19 122 lb 6.4 oz (55.5 kg)  02/13/19 127 lb 3.2 oz (57.7 kg)  01/30/19 130 lb 6.4 oz (59.1 kg)  01/20/19 127 lb (57.6 kg)  01/02/19 133 lb 9.6 oz (60.6 kg)  11/16/18 143 lb 4.8 oz (65 kg)  11/01/18 128 lb (58.1 kg)  08/09/18 137 lb 4.8 oz (62.3 kg)  04/25/18 143 lb 4.8 oz (65 kg)  01/31/18 134 lb 4.8 oz (60.9 kg)  12/21/17 133 lb 12.8 oz (60.7 kg)  10/11/17 130 lb 1.6 oz (59 kg)  07/22/17 128 lb 6 oz (58.2 kg)  07/08/17 129 lb 12.8 oz (58.9 kg)  06/09/17 130 lb 9.6 oz (59.2 kg)  06/02/17 125 lb 12.8 oz (57.1 kg)  09/02/16 168 lb (76.2 kg)  08/19/16 168 lb (76.2 kg)  08/14/16 168 lb (76.2 kg)  08/12/16 168 lb (76.2 kg)  07/24/16 168 lb (76.2 kg)  05/15/16 168 lb 10.4 oz (76.5 kg)  02/12/14 163 lb 9.3 oz (74.2 kg)  02/10/14 149 lb 6 oz (67.8 kg)  01/22/11 150 lb (68 kg)   Physical Exam  CURRENT PROBLEM LIST:  Patient Active Problem List   Diagnosis Date Noted   Pyelonephritis 04/27/2021   Malnutrition of moderate degree 09/05/2019   Hypokalemia 09/02/2019   Fever of unknown origin 09/02/2019   ESRD on dialysis ([facility]) 08/20/2019   Benign essential HTN 08/20/2019   Symptomatic anemia 08/20/2019   Thrombocytopenia ([facility]) 08/20/2019   CKD (chronic kidney disease) 05/23/2019   Type II diabetes mellitus, uncontrolled 12/07/2017   History of anemia due to chronic kidney disease 10/11/2017   Vitamin B 12 deficiency 06/09/2017   Great toe amputation status, right 09/04/2016   Osteomyelitis of toe of right foot ([facility])    Acute renal failure (ARF) ([facility]) 05/10/2016   SBO (small bowel obstruction) ([facility]) 05/08/2016   Diverticular disease of jejunum s/p resection 05/09/2016 05/08/2016    Normocytic anemia 05/05/2016   PAST MEDICAL HISTORY:  Active Ambulatory Problems    Diagnosis Date Noted   Normocytic anemia 05/05/2016   SBO (small bowel obstruction) ([facility]) 05/08/2016   Diverticular disease of jejunum s/p resection 05/09/2016 05/08/2016   Acute renal failure (ARF) ([facility]) 05/10/2016   Type II diabetes mellitus, uncontrolled 12/07/2017   Osteomyelitis of toe of right foot ([facility])    Great toe amputation status, right 09/04/2016   Vitamin B 12 deficiency 06/09/2017   History of anemia due to chronic kidney disease 10/11/2017   CKD (chronic kidney disease) 05/23/2019   ESRD on dialysis ([facility]) 08/20/2019   Benign essential HTN 08/20/2019   Symptomatic anemia 08/20/2019   Thrombocytopenia ([facility]) 08/20/2019  Hypokalemia 09/02/2019   Fever of unknown origin 09/02/2019   Malnutrition of moderate degree 09/05/2019   Pyelonephritis 04/27/2021   Resolved Ambulatory Problems    Diagnosis Date Noted   Poorly controlled type 2 diabetes mellitus with complication (HCC)    DKA (diabetic ketoacidoses) ([facility]) 02/10/2014   Abscess of buttock, left 02/10/2014   Sepsis ([facility]) 02/10/2014   Mass of small intestine s/p SB resection 05/09/2016 05/05/2016   UTI (urinary tract infection) 05/06/2016   Dehydration    Perforated viscus    Past Medical History:  Diagnosis Date   Anemia    ARF (acute renal failure) ([facility]) 04/2016   Chronic kidney disease    Depression    Diabetes mellitus    Diabetic retinopathy (HCC)    Diverticulitis    History of kidney stones    Hypertension    Osteomyelitis (HCC)    Vitamin D deficiency    Wears partial dentures    SOCIAL HX:  Social History   Tobacco Use   Smoking status: Never   Smokeless tobacco: Never  Substance Use Topics   Alcohol use: No    Alcohol/week: 0.0 standard drinks of alcohol   FAMILY HX:  Family History  Problem Relation Age of Onset   Diabetes Mother    Asthma Sister       ALLERGIES: No Known Allergies     PERTINENT MEDICATIONS:  Outpatient Encounter Medications as of 12/15/2021  Medication Sig   acetaminophen (TYLENOL) 500 MG tablet Take 1,000 mg by mouth every 6 (six) hours as needed for fever or headache (pain).    amLODipine (NORVASC) 5 MG tablet Take 1/2 tablet (2.5 mg total) by mouth at bedtime.   AURYXIA 1 GM 210 MG(Fe) tablet Take 210 mg by mouth 3 (three) times daily.   carvedilol (COREG) 3.125 MG tablet Take 1 tablet (3.125 mg total) by mouth 2 (two) times daily with a meal. (Patient taking differently: Take 6.25 mg by mouth 2 (two) times daily with a meal.)   hydrALAZINE (APRESOLINE) 25 MG tablet TAKE 1 TABLET(25 MG) BY MOUTH THREE TIMES DAILY   isosorbide dinitrate (ISORDIL) 20 MG tablet TAKE 1 TABLET(20 MG) BY MOUTH THREE TIMES DAILY   lidocaine (LIDODERM) 5 % Place 1 patch onto the skin daily. Remove & Discard patch within 12 hours or as directed by MD   No facility-administered encounter medications on file as of 12/15/2021.    Thank you for the opportunity to participate in the care of Bailey [name].  The palliative care team will continue to follow. Please call our office at [phone] if we can be of additional assistance.   Hollace Kinnier, DO  COVID-19 PATIENT SCREENING TOOL Asked and negative response unless otherwise noted:  Have you had symptoms of covid, tested positive or been in contact with someone with symptoms/positive test in the past 5-10 days?  NO

## 2021-12-16 DIAGNOSIS — N186 End stage renal disease: Secondary | ICD-10-CM | POA: Diagnosis not present

## 2021-12-16 DIAGNOSIS — D509 Iron deficiency anemia, unspecified: Secondary | ICD-10-CM | POA: Diagnosis not present

## 2021-12-16 DIAGNOSIS — N189 Chronic kidney disease, unspecified: Secondary | ICD-10-CM | POA: Diagnosis not present

## 2021-12-16 DIAGNOSIS — N2581 Secondary hyperparathyroidism of renal origin: Secondary | ICD-10-CM | POA: Diagnosis not present

## 2021-12-16 DIAGNOSIS — Z992 Dependence on renal dialysis: Secondary | ICD-10-CM | POA: Diagnosis not present

## 2021-12-16 DIAGNOSIS — D631 Anemia in chronic kidney disease: Secondary | ICD-10-CM | POA: Diagnosis not present

## 2021-12-16 NOTE — Progress Notes (Signed)
Called patient no answer left a vm

## 2021-12-17 ENCOUNTER — Ambulatory Visit: Payer: Medicare Other | Admitting: Psychiatry

## 2021-12-18 ENCOUNTER — Encounter: Payer: Self-pay | Admitting: *Deleted

## 2021-12-18 DIAGNOSIS — D509 Iron deficiency anemia, unspecified: Secondary | ICD-10-CM | POA: Diagnosis not present

## 2021-12-18 DIAGNOSIS — D631 Anemia in chronic kidney disease: Secondary | ICD-10-CM | POA: Diagnosis not present

## 2021-12-18 DIAGNOSIS — N2581 Secondary hyperparathyroidism of renal origin: Secondary | ICD-10-CM | POA: Diagnosis not present

## 2021-12-18 DIAGNOSIS — Z992 Dependence on renal dialysis: Secondary | ICD-10-CM | POA: Diagnosis not present

## 2021-12-18 DIAGNOSIS — N189 Chronic kidney disease, unspecified: Secondary | ICD-10-CM | POA: Diagnosis not present

## 2021-12-18 DIAGNOSIS — N186 End stage renal disease: Secondary | ICD-10-CM | POA: Diagnosis not present

## 2021-12-18 NOTE — Progress Notes (Signed)
Called and spoke with patients husband regarding her CAD results.

## 2021-12-18 NOTE — Progress Notes (Signed)
Spoke with patients husband. He will relay the message.

## 2021-12-18 NOTE — Progress Notes (Signed)
I tried calling patient no answer left a vm

## 2021-12-19 DIAGNOSIS — E118 Type 2 diabetes mellitus with unspecified complications: Secondary | ICD-10-CM | POA: Diagnosis not present

## 2021-12-19 DIAGNOSIS — R5383 Other fatigue: Secondary | ICD-10-CM | POA: Diagnosis not present

## 2021-12-19 DIAGNOSIS — N184 Chronic kidney disease, stage 4 (severe): Secondary | ICD-10-CM | POA: Diagnosis not present

## 2021-12-19 DIAGNOSIS — I10 Essential (primary) hypertension: Secondary | ICD-10-CM | POA: Diagnosis not present

## 2021-12-19 DIAGNOSIS — E1165 Type 2 diabetes mellitus with hyperglycemia: Secondary | ICD-10-CM | POA: Diagnosis not present

## 2021-12-19 DIAGNOSIS — D649 Anemia, unspecified: Secondary | ICD-10-CM | POA: Diagnosis not present

## 2021-12-19 DIAGNOSIS — Z23 Encounter for immunization: Secondary | ICD-10-CM | POA: Diagnosis not present

## 2021-12-20 DIAGNOSIS — N186 End stage renal disease: Secondary | ICD-10-CM | POA: Diagnosis not present

## 2021-12-20 DIAGNOSIS — D509 Iron deficiency anemia, unspecified: Secondary | ICD-10-CM | POA: Diagnosis not present

## 2021-12-20 DIAGNOSIS — N2581 Secondary hyperparathyroidism of renal origin: Secondary | ICD-10-CM | POA: Diagnosis not present

## 2021-12-20 DIAGNOSIS — Z992 Dependence on renal dialysis: Secondary | ICD-10-CM | POA: Diagnosis not present

## 2021-12-20 DIAGNOSIS — D631 Anemia in chronic kidney disease: Secondary | ICD-10-CM | POA: Diagnosis not present

## 2021-12-20 DIAGNOSIS — N189 Chronic kidney disease, unspecified: Secondary | ICD-10-CM | POA: Diagnosis not present

## 2021-12-22 ENCOUNTER — Telehealth: Payer: Self-pay | Admitting: Psychiatry

## 2021-12-22 ENCOUNTER — Ambulatory Visit (INDEPENDENT_AMBULATORY_CARE_PROVIDER_SITE_OTHER): Payer: Medicare Other | Admitting: Psychiatry

## 2021-12-22 VITALS — BP 116/55 | HR 64 | Ht 64.0 in | Wt 120.1 lb

## 2021-12-22 DIAGNOSIS — R413 Other amnesia: Secondary | ICD-10-CM

## 2021-12-22 DIAGNOSIS — I639 Cerebral infarction, unspecified: Secondary | ICD-10-CM

## 2021-12-22 MED ORDER — DONEPEZIL HCL 5 MG PO TABS: ORAL_TABLET | ORAL | 4 refills | Status: DC

## 2021-12-22 NOTE — Progress Notes (Signed)
This encounter was created in error - please disregard.

## 2021-12-22 NOTE — Patient Instructions (Addendum)
Blood work to look for causes of memory loss MRI of the brain  Please take baby aspirin (81 mg daily) to prevent future strokes Start donepezil daily for memory Please let me know what tests were done by your cardiologist. Can call office with these tests

## 2021-12-22 NOTE — Progress Notes (Signed)
[facility]  PATIENT: Bailey [name] DOB: [birth year]  REFERRING CLINICIAN: [name], MD HISTORY FROM: self, husband REASON FOR VISIT: memory loss   HISTORICAL  CHIEF COMPLAINT:  Chief Complaint  Patient presents with   New Patient (Initial Visit)    Pt reports feeling lowsy.  Room1 with husband.    HISTORY OF PRESENT ILLNESS:  The patient presents for evaluation of memory loss which has been  worsening over the past few months. She is more forgetful and will forget conversations she has had or tasks that she has done previously that day. Husband has to call her daily to help with her medications, and she will often not remember if she took her medications or not.  Had a fall 4 months ago when she slipped and fell down the steps. Went to the ED 07/27/21 where [facility] showed progressed cerebral volume loss and small vessel disease since 2012. It also showed a remote right thalamic infarct. She was not aware that she had a stroke. Does not take any medications for stroke prevention.   TBI:  No past history of TBI Stroke: Remote lacunar stroke on most recent CT scan Seizures:  no past history of seizures Sleep: Not sleeping well due to neck pain Mood: She has become more irritable and gets frustrated more easily  Functional status: Patient lives with her husband Cooking: She does not do much cooking Cleaning: washes the dishes and clothing Driving: Stopped driving 2 years ago due to vision issues Bills: Husband handles finances Medications: Husband helps manage her medications. He calls her to remind her to take the medications Ever left the stove on by accident?: no Forgetting loved ones names?: no, forgets acquaintances names Word finding difficulty? rarely  OTHER MEDICAL CONDITIONS: ESRD on dialysis, HLD, anxiety, HTN, DM2, diverticulitis, GERD   REVIEW OF SYSTEMS: Full 14 system review of systems performed and negative with exception of: memory  loss  ALLERGIES: No Known Allergies  HOME MEDICATIONS: Outpatient Medications Prior to Visit  Medication Sig Dispense Refill   acetaminophen (TYLENOL) 500 MG tablet Take 1,000 mg by mouth every 6 (six) hours as needed for fever or headache (pain).      amLODipine (NORVASC) 5 MG tablet Take 1/2 tablet (2.5 mg total) by mouth at bedtime. 30 tablet 3   AURYXIA 1 GM 210 MG(Fe) tablet Take 210 mg by mouth 3 (three) times daily.     carvedilol (COREG) 3.125 MG tablet Take 1 tablet (3.125 mg total) by mouth 2 (two) times daily with a meal. (Patient taking differently: Take 6.25 mg by mouth 2 (two) times daily with a meal.) 60 tablet 1   hydrALAZINE (APRESOLINE) 25 MG tablet TAKE 1 TABLET(25 MG) BY MOUTH THREE TIMES DAILY 270 tablet 0   isosorbide dinitrate (ISORDIL) 20 MG tablet TAKE 1 TABLET(20 MG) BY MOUTH THREE TIMES DAILY 270 tablet 0   lidocaine (LIDODERM) 5 % Place 1 patch onto the skin daily. Remove & Discard patch within 12 hours or as directed by MD 30 patch 0   No facility-administered medications prior to visit.    PAST MEDICAL HISTORY: Past Medical History:  Diagnosis Date   Anemia    patient preference - stopped iron    ARF (acute renal failure) ([place]) 04/2016   dehydration   Chronic kidney disease    stage 5   DDD (degenerative disc disease), lumbar    Depression    Diabetes mellitus    Type II - pt preference -  stopped lantus   Diabetic retinopathy ([place])    Diverticulitis    History of kidney stones    passed- 6   Hypertension    Lacunar infarction ([place])    Osteomyelitis ([place])    Polyneuropathy    Vitamin B 12 deficiency 06/09/2017   Vitamin D deficiency    Wears partial dentures    upper    PAST SURGICAL HISTORY: Past Surgical History:  Procedure Laterality Date   AMPUTATION Right 08/14/2016   Procedure: RIGHT 1ST RAY AMPUTATION MID-SHAFT;  Surgeon: Newt Minion, MD;  Location: [place];  Service: Orthopedics;  Laterality: Right;   BASCILIC VEIN TRANSPOSITION  Left 06/08/2019   Procedure: LEFT BRACHIOCEPHALIC VEIN CREATION;  Surgeon: Marty Heck, MD;  Location: [place];  Service: Vascular;  Laterality: Left;   CATARACT EXTRACTION  2018   INCISION AND DRAINAGE ABSCESS Left 02/11/2014   Procedure: INCISION AND DRAINAGE ABSCESS Left Buttock;  Surgeon: Jackolyn Confer, MD;  Location: [facility];  Service: General;  Laterality: Left;   INSERTION OF DIALYSIS CATHETER Left 09/07/2019   Procedure: INSERTION OF LEFT INTERNAL JUGULAR DIALYSIS CATHETER;  Surgeon: Rosetta Posner, MD;  Location: [facility];  Service: Vascular;  Laterality: Left;   IR REMOVAL TUN CV CATH W/O FL  09/04/2019   LAPAROSCOPIC SMALL BOWEL RESECTION N/A 05/09/2016   Procedure: LAPAROSCOPIC SMALL BOWEL RESECTION;  Surgeon: Michael Boston, MD;  Location: [facility];  Service: General;  Laterality: N/A;   LAPAROSCOPY N/A 05/09/2016   Procedure: LAPAROSCOPY DIAGNOSTIC, LYSIS OF ADHESIONS, SMALL BOWEL RESECTION X 2;  Surgeon: Michael Boston, MD;  Location: [facility];  Service: General;  Laterality: N/A;   TOE AMPUTATION     left foot great toe    FAMILY HISTORY: Family History  Problem Relation Age of Onset   Diabetes Mother    Asthma Sister     SOCIAL HISTORY: Social History   Socioeconomic History   Marital status: Married    Spouse name: Not on file   Number of children: Not on file   Years of education: Not on file   Highest education level: Not on file  Occupational History   Not on file  Tobacco Use   Smoking status: Never   Smokeless tobacco: Never  Vaping Use   Vaping Use: Never used  Substance and Sexual Activity   Alcohol use: No    Alcohol/week: 0.0 standard drinks of alcohol   Drug use: No   Sexual activity: Not on file  Other Topics Concern   Not on file  Social History Narrative   Not on file   Social Determinants of Health   Financial Resource Strain: Not on file  Food Insecurity: Not on file  Transportation Needs: Not on file  Physical Activity: Not on file   Stress: Not on file  Social Connections: Not on file  Intimate Partner Violence: Not on file     PHYSICAL EXAM  GENERAL EXAM/CONSTITUTIONAL: Vitals:  Vitals:   12/22/21 1003  BP: (!) 116/55  Pulse: 64  Weight: 120 lb 2 oz (54.5 kg)  Height: $Remove'5\' 4"'WFjwMbY$  (1.626 m)   Body mass index is 20.62 kg/m. Wt Readings from Last 3 Encounters:  12/22/21 120 lb 2 oz (54.5 kg)  12/10/21 120 lb 12.8 oz (54.8 kg)  07/27/21 114 lb (51.7 kg)   NEUROLOGIC: MENTAL STATUS:     12/22/2021   10:06 AM  Montreal Cognitive Assessment   Visuospatial/ Executive (0/5) 0  Naming (0/3) 3  Attention: Read  list of digits (0/2) 2  Attention: Read list of letters (0/1) 1  Attention: Serial 7 subtraction starting at 100 (0/3) 0  Language: Repeat phrase (0/2) 1  Language : Fluency (0/1) 0  Abstraction (0/2) 2  Delayed Recall (0/5) 0  Orientation (0/6) 3  Total 12  Adjusted Score (based on education) 13     CRANIAL NERVE:  2nd, 3rd, 4th, 6th - pupils equal and reactive to light, visual fields full to confrontation, extraocular muscles intact, no nystagmus 5th - facial sensation symmetric 7th - facial strength symmetric 8th - hearing intact 9th - palate elevates symmetrically, uvula midline 11th - shoulder shrug symmetric 12th - tongue protrusion midline  MOTOR:  normal bulk and tone, no cogwheeling, full strength in the BUE, BLE  SENSORY:  normal and symmetric to light touch all 4 extremeites  COORDINATION:  finger-nose-finger intact bilaterally, mild bradykinesia with left fine finger movements normal, no tremor  REFLEXES:  deep tendon reflexes present and symmetric  GAIT/STATION:  Normal based     DIAGNOSTIC DATA (LABS, IMAGING, TESTING) - I reviewed patient records, labs, notes, testing and imaging myself where available.  Lab Results  Component Value Date   WBC 8.4 05/10/2021   HGB 9.5 (L) 05/10/2021   HCT 30.0 (L) 05/10/2021   MCV 98.7 05/10/2021   PLT 162 05/10/2021       Component Value Date/Time   NA 137 05/10/2021 0215   K 3.7 05/10/2021 0215   CL 97 (L) 05/10/2021 0215   CO2 30 05/10/2021 0215   GLUCOSE 99 05/10/2021 0215   BUN 16 05/10/2021 0215   CREATININE 3.95 (H) 05/10/2021 0215   CREATININE 3.89 (HH) 12/20/2019 1518   CREATININE 0.74 02/10/2014 1220   CALCIUM 9.0 05/10/2021 0215   PROT 5.9 (L) 05/10/2021 0215   ALBUMIN 3.1 (L) 05/10/2021 0215   AST 13 (L) 05/10/2021 0215   AST 13 (L) 12/20/2019 1518   ALT 9 05/10/2021 0215   ALT 8 12/20/2019 1518   ALKPHOS 49 05/10/2021 0215   BILITOT 0.4 05/10/2021 0215   BILITOT 0.4 12/20/2019 1518   GFRNONAA 12 (L) 05/10/2021 0215   GFRNONAA 12 (L) 12/20/2019 1518   GFRAA 13 (L) 10/18/2019 1525   Lab Results  Component Value Date   CHOL  01/07/2009    145        ATP III CLASSIFICATION:  <200     mg/dL   Desirable  200-239  mg/dL   Borderline High  >=240    mg/dL   High          HDL 22 (L) 01/07/2009   LDLCALC  01/07/2009    75        Total Cholesterol/HDL:CHD Risk Coronary Heart Disease Risk Table                     Men   Women  1/2 Average Risk   3.4   3.3  Average Risk       5.0   4.4  2 X Average Risk   9.6   7.1  3 X Average Risk  23.4   11.0        Use the calculated Patient Ratio above and the CHD Risk Table to determine the patient's CHD Risk.        ATP III CLASSIFICATION (LDL):  <100     mg/dL   Optimal  100-129  mg/dL   Near or Above  Optimal  130-159  mg/dL   Borderline  160-189  mg/dL   High  >190     mg/dL   Very High   TRIG 238 (H) 01/07/2009   CHOLHDL 6.6 01/07/2009   Lab Results  Component Value Date   HGBA1C 5.5 08/20/2019   Lab Results  Component Value Date   [id number] 778 09/04/2019   Lab Results  Component Value Date   TSH 3.050 09/02/2019    11/14/21:  A1c 5.7   ASSESSMENT AND PLAN  71 y.o. year old female with a history of ESRD on dialysis, HLD, anxiety, HTN, DM2, diverticulitis, GERD who presents for evaluation of  memory loss which has worsened over the past few months. This has impacted her ADLs including her ability to manage medications. MOCA score today is 13/30, concerning for dementia. Will order brain MRI to assess for neurodegenerative disease and significant vascular changes. Will start donepezil for memory. CTH showed a remote lacunar infarct. Will start ASA 81 mg daily and check lipid panel today. She believes she has had a carotid US done, husband will check for these results and send them to our office. TTE is ordered but not yet complete.   1. Memory loss   2. Cerebrovascular accident (CVA), unspecified mechanism ([place])       PLAN: - Labs: B12, lipid panel - MRI brain - Husband will see if carotid US was done, will order vessel imaging pending this result - Start ASA 81 mg daily - Start donepezil 2.5 mg daily x 4 weeks, then increase to 5 mg daily  Orders Placed This Encounter  Procedures   MR BRAIN WO CONTRAST   Vitamin B12   TSH   Lipid Panel    Meds ordered this encounter  Medications   donepezil (ARICEPT) 5 MG tablet    Sig: Take 1/2 pill daily for 4 weeks, then increase to 1 pill daily    Dispense:  90 tablet    Refill:  4    30 pills/30 days    Return in about 6 months (around 06/23/2022).  I spent an average of 45 minutes chart reviewing and counseling the patient, with at least 50% of the time face to face with the patient.   Genia Harold, MD 12/22/21 11:00 AM  [facility] [address] [place], [place] [address] [phone]

## 2021-12-22 NOTE — Telephone Encounter (Signed)
medicare/BCBS sup NPR sent to GI [phone]

## 2021-12-23 ENCOUNTER — Telehealth: Payer: Self-pay | Admitting: *Deleted

## 2021-12-23 ENCOUNTER — Other Ambulatory Visit: Payer: Self-pay | Admitting: Psychiatry

## 2021-12-23 DIAGNOSIS — D509 Iron deficiency anemia, unspecified: Secondary | ICD-10-CM | POA: Diagnosis not present

## 2021-12-23 DIAGNOSIS — N189 Chronic kidney disease, unspecified: Secondary | ICD-10-CM | POA: Diagnosis not present

## 2021-12-23 DIAGNOSIS — D631 Anemia in chronic kidney disease: Secondary | ICD-10-CM | POA: Diagnosis not present

## 2021-12-23 DIAGNOSIS — Z992 Dependence on renal dialysis: Secondary | ICD-10-CM | POA: Diagnosis not present

## 2021-12-23 DIAGNOSIS — N186 End stage renal disease: Secondary | ICD-10-CM | POA: Diagnosis not present

## 2021-12-23 DIAGNOSIS — N2581 Secondary hyperparathyroidism of renal origin: Secondary | ICD-10-CM | POA: Diagnosis not present

## 2021-12-23 LAB — VITAMIN B12: Vitamin B-12: 405 pg/mL (ref 232–1245)

## 2021-12-23 LAB — LIPID PANEL
Chol/HDL Ratio: 4.6 ratio — ABNORMAL HIGH (ref 0.0–4.4)
Cholesterol, Total: 184 mg/dL (ref 100–199)
HDL: 40 mg/dL (ref >39)
LDL Chol Calc (NIH): 116 mg/dL — ABNORMAL HIGH (ref 0–99)
Triglycerides: 158 mg/dL — ABNORMAL HIGH (ref 0–149)
VLDL Cholesterol Cal: 28 mg/dL (ref 5–40)

## 2021-12-23 LAB — TSH: TSH: 1.33 u[IU]/mL (ref 0.450–4.500)

## 2021-12-23 MED ORDER — ATORVASTATIN CALCIUM 40 MG PO TABS: 40.0000 mg | ORAL_TABLET | Freq: Every day | ORAL | 6 refills | Status: DC

## 2021-12-23 NOTE — Telephone Encounter (Signed)
Called patient, LVM informing her labs showed LDL cholesterol level is high at 116. When someone has had a stroke we like this level to be <70. Dr Billey Gosling would like her to start a daily cholesterol medication called atorvastatin. She sent in a prescription to her pharmacy. Left # for questions.

## 2021-12-24 DIAGNOSIS — E1129 Type 2 diabetes mellitus with other diabetic kidney complication: Secondary | ICD-10-CM | POA: Diagnosis not present

## 2021-12-24 DIAGNOSIS — Z992 Dependence on renal dialysis: Secondary | ICD-10-CM | POA: Diagnosis not present

## 2021-12-24 DIAGNOSIS — N186 End stage renal disease: Secondary | ICD-10-CM | POA: Diagnosis not present

## 2021-12-25 DIAGNOSIS — N186 End stage renal disease: Secondary | ICD-10-CM | POA: Diagnosis not present

## 2021-12-25 DIAGNOSIS — D631 Anemia in chronic kidney disease: Secondary | ICD-10-CM | POA: Diagnosis not present

## 2021-12-25 DIAGNOSIS — D509 Iron deficiency anemia, unspecified: Secondary | ICD-10-CM | POA: Diagnosis not present

## 2021-12-25 DIAGNOSIS — E1122 Type 2 diabetes mellitus with diabetic chronic kidney disease: Secondary | ICD-10-CM | POA: Diagnosis not present

## 2021-12-25 DIAGNOSIS — N189 Chronic kidney disease, unspecified: Secondary | ICD-10-CM | POA: Diagnosis not present

## 2021-12-25 DIAGNOSIS — N2581 Secondary hyperparathyroidism of renal origin: Secondary | ICD-10-CM | POA: Diagnosis not present

## 2021-12-25 DIAGNOSIS — Z992 Dependence on renal dialysis: Secondary | ICD-10-CM | POA: Diagnosis not present

## 2021-12-27 DIAGNOSIS — N189 Chronic kidney disease, unspecified: Secondary | ICD-10-CM | POA: Diagnosis not present

## 2021-12-27 DIAGNOSIS — E1122 Type 2 diabetes mellitus with diabetic chronic kidney disease: Secondary | ICD-10-CM | POA: Diagnosis not present

## 2021-12-27 DIAGNOSIS — N186 End stage renal disease: Secondary | ICD-10-CM | POA: Diagnosis not present

## 2021-12-27 DIAGNOSIS — D509 Iron deficiency anemia, unspecified: Secondary | ICD-10-CM | POA: Diagnosis not present

## 2021-12-27 DIAGNOSIS — N2581 Secondary hyperparathyroidism of renal origin: Secondary | ICD-10-CM | POA: Diagnosis not present

## 2021-12-27 DIAGNOSIS — Z992 Dependence on renal dialysis: Secondary | ICD-10-CM | POA: Diagnosis not present

## 2021-12-30 DIAGNOSIS — E1122 Type 2 diabetes mellitus with diabetic chronic kidney disease: Secondary | ICD-10-CM | POA: Diagnosis not present

## 2021-12-30 DIAGNOSIS — Z992 Dependence on renal dialysis: Secondary | ICD-10-CM | POA: Diagnosis not present

## 2021-12-30 DIAGNOSIS — N2581 Secondary hyperparathyroidism of renal origin: Secondary | ICD-10-CM | POA: Diagnosis not present

## 2021-12-30 DIAGNOSIS — N186 End stage renal disease: Secondary | ICD-10-CM | POA: Diagnosis not present

## 2021-12-30 DIAGNOSIS — D509 Iron deficiency anemia, unspecified: Secondary | ICD-10-CM | POA: Diagnosis not present

## 2021-12-30 DIAGNOSIS — N189 Chronic kidney disease, unspecified: Secondary | ICD-10-CM | POA: Diagnosis not present

## 2022-01-03 DIAGNOSIS — N186 End stage renal disease: Secondary | ICD-10-CM | POA: Diagnosis not present

## 2022-01-03 DIAGNOSIS — D509 Iron deficiency anemia, unspecified: Secondary | ICD-10-CM | POA: Diagnosis not present

## 2022-01-03 DIAGNOSIS — E1122 Type 2 diabetes mellitus with diabetic chronic kidney disease: Secondary | ICD-10-CM | POA: Diagnosis not present

## 2022-01-03 DIAGNOSIS — N189 Chronic kidney disease, unspecified: Secondary | ICD-10-CM | POA: Diagnosis not present

## 2022-01-03 DIAGNOSIS — N2581 Secondary hyperparathyroidism of renal origin: Secondary | ICD-10-CM | POA: Diagnosis not present

## 2022-01-03 DIAGNOSIS — Z992 Dependence on renal dialysis: Secondary | ICD-10-CM | POA: Diagnosis not present

## 2022-01-04 ENCOUNTER — Other Ambulatory Visit: Payer: Medicare Other

## 2022-01-05 DIAGNOSIS — E1165 Type 2 diabetes mellitus with hyperglycemia: Secondary | ICD-10-CM | POA: Diagnosis not present

## 2022-01-05 DIAGNOSIS — E782 Mixed hyperlipidemia: Secondary | ICD-10-CM | POA: Diagnosis not present

## 2022-01-05 DIAGNOSIS — E118 Type 2 diabetes mellitus with unspecified complications: Secondary | ICD-10-CM | POA: Diagnosis not present

## 2022-01-05 DIAGNOSIS — R5383 Other fatigue: Secondary | ICD-10-CM | POA: Diagnosis not present

## 2022-01-06 DIAGNOSIS — D509 Iron deficiency anemia, unspecified: Secondary | ICD-10-CM | POA: Diagnosis not present

## 2022-01-06 DIAGNOSIS — N186 End stage renal disease: Secondary | ICD-10-CM | POA: Diagnosis not present

## 2022-01-06 DIAGNOSIS — N189 Chronic kidney disease, unspecified: Secondary | ICD-10-CM | POA: Diagnosis not present

## 2022-01-06 DIAGNOSIS — E1122 Type 2 diabetes mellitus with diabetic chronic kidney disease: Secondary | ICD-10-CM | POA: Diagnosis not present

## 2022-01-06 DIAGNOSIS — Z992 Dependence on renal dialysis: Secondary | ICD-10-CM | POA: Diagnosis not present

## 2022-01-06 DIAGNOSIS — N2581 Secondary hyperparathyroidism of renal origin: Secondary | ICD-10-CM | POA: Diagnosis not present

## 2022-01-07 DIAGNOSIS — F411 Generalized anxiety disorder: Secondary | ICD-10-CM | POA: Diagnosis not present

## 2022-01-07 DIAGNOSIS — R011 Cardiac murmur, unspecified: Secondary | ICD-10-CM | POA: Diagnosis not present

## 2022-01-07 DIAGNOSIS — E782 Mixed hyperlipidemia: Secondary | ICD-10-CM | POA: Diagnosis not present

## 2022-01-07 DIAGNOSIS — N186 End stage renal disease: Secondary | ICD-10-CM | POA: Diagnosis not present

## 2022-01-07 DIAGNOSIS — I10 Essential (primary) hypertension: Secondary | ICD-10-CM | POA: Diagnosis not present

## 2022-01-07 DIAGNOSIS — R7303 Prediabetes: Secondary | ICD-10-CM | POA: Diagnosis not present

## 2022-01-08 DIAGNOSIS — N189 Chronic kidney disease, unspecified: Secondary | ICD-10-CM | POA: Diagnosis not present

## 2022-01-08 DIAGNOSIS — D509 Iron deficiency anemia, unspecified: Secondary | ICD-10-CM | POA: Diagnosis not present

## 2022-01-08 DIAGNOSIS — E1122 Type 2 diabetes mellitus with diabetic chronic kidney disease: Secondary | ICD-10-CM | POA: Diagnosis not present

## 2022-01-08 DIAGNOSIS — N186 End stage renal disease: Secondary | ICD-10-CM | POA: Diagnosis not present

## 2022-01-08 DIAGNOSIS — N2581 Secondary hyperparathyroidism of renal origin: Secondary | ICD-10-CM | POA: Diagnosis not present

## 2022-01-08 DIAGNOSIS — Z992 Dependence on renal dialysis: Secondary | ICD-10-CM | POA: Diagnosis not present

## 2022-01-10 DIAGNOSIS — E1122 Type 2 diabetes mellitus with diabetic chronic kidney disease: Secondary | ICD-10-CM | POA: Diagnosis not present

## 2022-01-10 DIAGNOSIS — N186 End stage renal disease: Secondary | ICD-10-CM | POA: Diagnosis not present

## 2022-01-10 DIAGNOSIS — D509 Iron deficiency anemia, unspecified: Secondary | ICD-10-CM | POA: Diagnosis not present

## 2022-01-10 DIAGNOSIS — Z992 Dependence on renal dialysis: Secondary | ICD-10-CM | POA: Diagnosis not present

## 2022-01-10 DIAGNOSIS — N2581 Secondary hyperparathyroidism of renal origin: Secondary | ICD-10-CM | POA: Diagnosis not present

## 2022-01-10 DIAGNOSIS — N189 Chronic kidney disease, unspecified: Secondary | ICD-10-CM | POA: Diagnosis not present

## 2022-01-14 DIAGNOSIS — N189 Chronic kidney disease, unspecified: Secondary | ICD-10-CM | POA: Diagnosis not present

## 2022-01-14 DIAGNOSIS — D509 Iron deficiency anemia, unspecified: Secondary | ICD-10-CM | POA: Diagnosis not present

## 2022-01-14 DIAGNOSIS — Z992 Dependence on renal dialysis: Secondary | ICD-10-CM | POA: Diagnosis not present

## 2022-01-14 DIAGNOSIS — N2581 Secondary hyperparathyroidism of renal origin: Secondary | ICD-10-CM | POA: Diagnosis not present

## 2022-01-14 DIAGNOSIS — N186 End stage renal disease: Secondary | ICD-10-CM | POA: Diagnosis not present

## 2022-01-14 DIAGNOSIS — E1122 Type 2 diabetes mellitus with diabetic chronic kidney disease: Secondary | ICD-10-CM | POA: Diagnosis not present

## 2022-01-17 DIAGNOSIS — N189 Chronic kidney disease, unspecified: Secondary | ICD-10-CM | POA: Diagnosis not present

## 2022-01-17 DIAGNOSIS — N186 End stage renal disease: Secondary | ICD-10-CM | POA: Diagnosis not present

## 2022-01-17 DIAGNOSIS — D509 Iron deficiency anemia, unspecified: Secondary | ICD-10-CM | POA: Diagnosis not present

## 2022-01-17 DIAGNOSIS — E1122 Type 2 diabetes mellitus with diabetic chronic kidney disease: Secondary | ICD-10-CM | POA: Diagnosis not present

## 2022-01-17 DIAGNOSIS — Z992 Dependence on renal dialysis: Secondary | ICD-10-CM | POA: Diagnosis not present

## 2022-01-17 DIAGNOSIS — N2581 Secondary hyperparathyroidism of renal origin: Secondary | ICD-10-CM | POA: Diagnosis not present

## 2022-01-20 DIAGNOSIS — E1122 Type 2 diabetes mellitus with diabetic chronic kidney disease: Secondary | ICD-10-CM | POA: Diagnosis not present

## 2022-01-20 DIAGNOSIS — N186 End stage renal disease: Secondary | ICD-10-CM | POA: Diagnosis not present

## 2022-01-20 DIAGNOSIS — N189 Chronic kidney disease, unspecified: Secondary | ICD-10-CM | POA: Diagnosis not present

## 2022-01-20 DIAGNOSIS — D509 Iron deficiency anemia, unspecified: Secondary | ICD-10-CM | POA: Diagnosis not present

## 2022-01-20 DIAGNOSIS — Z992 Dependence on renal dialysis: Secondary | ICD-10-CM | POA: Diagnosis not present

## 2022-01-20 DIAGNOSIS — N2581 Secondary hyperparathyroidism of renal origin: Secondary | ICD-10-CM | POA: Diagnosis not present

## 2022-01-22 DIAGNOSIS — E1122 Type 2 diabetes mellitus with diabetic chronic kidney disease: Secondary | ICD-10-CM | POA: Diagnosis not present

## 2022-01-22 DIAGNOSIS — N186 End stage renal disease: Secondary | ICD-10-CM | POA: Diagnosis not present

## 2022-01-22 DIAGNOSIS — N189 Chronic kidney disease, unspecified: Secondary | ICD-10-CM | POA: Diagnosis not present

## 2022-01-22 DIAGNOSIS — D509 Iron deficiency anemia, unspecified: Secondary | ICD-10-CM | POA: Diagnosis not present

## 2022-01-22 DIAGNOSIS — N2581 Secondary hyperparathyroidism of renal origin: Secondary | ICD-10-CM | POA: Diagnosis not present

## 2022-01-22 DIAGNOSIS — Z992 Dependence on renal dialysis: Secondary | ICD-10-CM | POA: Diagnosis not present

## 2022-01-23 DIAGNOSIS — N186 End stage renal disease: Secondary | ICD-10-CM | POA: Diagnosis not present

## 2022-01-23 DIAGNOSIS — Z992 Dependence on renal dialysis: Secondary | ICD-10-CM | POA: Diagnosis not present

## 2022-01-23 DIAGNOSIS — E1129 Type 2 diabetes mellitus with other diabetic kidney complication: Secondary | ICD-10-CM | POA: Diagnosis not present

## 2022-01-24 DIAGNOSIS — Z992 Dependence on renal dialysis: Secondary | ICD-10-CM | POA: Diagnosis not present

## 2022-01-24 DIAGNOSIS — D631 Anemia in chronic kidney disease: Secondary | ICD-10-CM | POA: Diagnosis not present

## 2022-01-24 DIAGNOSIS — E1122 Type 2 diabetes mellitus with diabetic chronic kidney disease: Secondary | ICD-10-CM | POA: Diagnosis not present

## 2022-01-24 DIAGNOSIS — N189 Chronic kidney disease, unspecified: Secondary | ICD-10-CM | POA: Diagnosis not present

## 2022-01-24 DIAGNOSIS — N186 End stage renal disease: Secondary | ICD-10-CM | POA: Diagnosis not present

## 2022-01-24 DIAGNOSIS — D509 Iron deficiency anemia, unspecified: Secondary | ICD-10-CM | POA: Diagnosis not present

## 2022-01-24 DIAGNOSIS — N2581 Secondary hyperparathyroidism of renal origin: Secondary | ICD-10-CM | POA: Diagnosis not present

## 2022-01-25 ENCOUNTER — Other Ambulatory Visit: Payer: Medicare Other

## 2022-01-27 DIAGNOSIS — D631 Anemia in chronic kidney disease: Secondary | ICD-10-CM | POA: Diagnosis not present

## 2022-01-27 DIAGNOSIS — N189 Chronic kidney disease, unspecified: Secondary | ICD-10-CM | POA: Diagnosis not present

## 2022-01-27 DIAGNOSIS — N2581 Secondary hyperparathyroidism of renal origin: Secondary | ICD-10-CM | POA: Diagnosis not present

## 2022-01-27 DIAGNOSIS — N186 End stage renal disease: Secondary | ICD-10-CM | POA: Diagnosis not present

## 2022-01-27 DIAGNOSIS — E1122 Type 2 diabetes mellitus with diabetic chronic kidney disease: Secondary | ICD-10-CM | POA: Diagnosis not present

## 2022-01-27 DIAGNOSIS — Z992 Dependence on renal dialysis: Secondary | ICD-10-CM | POA: Diagnosis not present

## 2022-01-31 DIAGNOSIS — N186 End stage renal disease: Secondary | ICD-10-CM | POA: Diagnosis not present

## 2022-01-31 DIAGNOSIS — N2581 Secondary hyperparathyroidism of renal origin: Secondary | ICD-10-CM | POA: Diagnosis not present

## 2022-01-31 DIAGNOSIS — E1122 Type 2 diabetes mellitus with diabetic chronic kidney disease: Secondary | ICD-10-CM | POA: Diagnosis not present

## 2022-01-31 DIAGNOSIS — Z992 Dependence on renal dialysis: Secondary | ICD-10-CM | POA: Diagnosis not present

## 2022-01-31 DIAGNOSIS — N189 Chronic kidney disease, unspecified: Secondary | ICD-10-CM | POA: Diagnosis not present

## 2022-01-31 DIAGNOSIS — D631 Anemia in chronic kidney disease: Secondary | ICD-10-CM | POA: Diagnosis not present

## 2022-02-03 DIAGNOSIS — E1122 Type 2 diabetes mellitus with diabetic chronic kidney disease: Secondary | ICD-10-CM | POA: Diagnosis not present

## 2022-02-03 DIAGNOSIS — N2581 Secondary hyperparathyroidism of renal origin: Secondary | ICD-10-CM | POA: Diagnosis not present

## 2022-02-03 DIAGNOSIS — D631 Anemia in chronic kidney disease: Secondary | ICD-10-CM | POA: Diagnosis not present

## 2022-02-03 DIAGNOSIS — N189 Chronic kidney disease, unspecified: Secondary | ICD-10-CM | POA: Diagnosis not present

## 2022-02-03 DIAGNOSIS — Z992 Dependence on renal dialysis: Secondary | ICD-10-CM | POA: Diagnosis not present

## 2022-02-03 DIAGNOSIS — N186 End stage renal disease: Secondary | ICD-10-CM | POA: Diagnosis not present

## 2022-02-05 DIAGNOSIS — N186 End stage renal disease: Secondary | ICD-10-CM | POA: Diagnosis not present

## 2022-02-05 DIAGNOSIS — N189 Chronic kidney disease, unspecified: Secondary | ICD-10-CM | POA: Diagnosis not present

## 2022-02-05 DIAGNOSIS — N2581 Secondary hyperparathyroidism of renal origin: Secondary | ICD-10-CM | POA: Diagnosis not present

## 2022-02-05 DIAGNOSIS — E1122 Type 2 diabetes mellitus with diabetic chronic kidney disease: Secondary | ICD-10-CM | POA: Diagnosis not present

## 2022-02-05 DIAGNOSIS — Z992 Dependence on renal dialysis: Secondary | ICD-10-CM | POA: Diagnosis not present

## 2022-02-05 DIAGNOSIS — D631 Anemia in chronic kidney disease: Secondary | ICD-10-CM | POA: Diagnosis not present

## 2022-02-07 DIAGNOSIS — N189 Chronic kidney disease, unspecified: Secondary | ICD-10-CM | POA: Diagnosis not present

## 2022-02-07 DIAGNOSIS — E1122 Type 2 diabetes mellitus with diabetic chronic kidney disease: Secondary | ICD-10-CM | POA: Diagnosis not present

## 2022-02-07 DIAGNOSIS — N2581 Secondary hyperparathyroidism of renal origin: Secondary | ICD-10-CM | POA: Diagnosis not present

## 2022-02-07 DIAGNOSIS — Z992 Dependence on renal dialysis: Secondary | ICD-10-CM | POA: Diagnosis not present

## 2022-02-07 DIAGNOSIS — N186 End stage renal disease: Secondary | ICD-10-CM | POA: Diagnosis not present

## 2022-02-07 DIAGNOSIS — D631 Anemia in chronic kidney disease: Secondary | ICD-10-CM | POA: Diagnosis not present

## 2022-02-08 ENCOUNTER — Ambulatory Visit
Admission: RE | Admit: 2022-02-08 | Discharge: 2022-02-08 | Disposition: A | Discharge status: Home / Self Care | Payer: Medicare Other | Source: Ambulatory Visit | Attending: Psychiatry | Admitting: Psychiatry

## 2022-02-08 DIAGNOSIS — R413 Other amnesia: Secondary | ICD-10-CM

## 2022-02-08 DIAGNOSIS — G35 Multiple sclerosis: Secondary | ICD-10-CM | POA: Diagnosis not present

## 2022-02-09 ENCOUNTER — Telehealth: Payer: Self-pay

## 2022-02-09 NOTE — Telephone Encounter (Signed)
-----   Message from Genia Harold, MD sent at 02/09/2022 11:07 AM EST ----- Brain MRI shows a couple of small old strokes. They are small and likely never caused symptoms. She should continue her aspirin and cholesterol medication to help prevent future strokes.   Otherwise her MRI just shows some mild age-related changes, which are expected for someone her age.

## 2022-02-09 NOTE — Telephone Encounter (Signed)
Contacted pt/spouse, Lvm rq CB

## 2022-02-10 ENCOUNTER — Ambulatory Visit: Payer: Self-pay | Admitting: Cardiology

## 2022-02-11 DIAGNOSIS — G4700 Insomnia, unspecified: Secondary | ICD-10-CM | POA: Diagnosis not present

## 2022-02-11 DIAGNOSIS — Z7185 Encounter for immunization safety counseling: Secondary | ICD-10-CM | POA: Diagnosis not present

## 2022-02-11 DIAGNOSIS — I10 Essential (primary) hypertension: Secondary | ICD-10-CM | POA: Diagnosis not present

## 2022-02-11 DIAGNOSIS — F331 Major depressive disorder, recurrent, moderate: Secondary | ICD-10-CM | POA: Diagnosis not present

## 2022-02-11 DIAGNOSIS — Z23 Encounter for immunization: Secondary | ICD-10-CM | POA: Diagnosis not present

## 2022-02-11 DIAGNOSIS — F411 Generalized anxiety disorder: Secondary | ICD-10-CM | POA: Diagnosis not present

## 2022-02-11 NOTE — Telephone Encounter (Signed)
Pt spouse returned call, informed him Brain MRI shows a couple of small old strokes. They are small and likely never caused symptoms. She should continue her aspirin and cholesterol medication to help prevent future strokes.  Otherwise her MRI just shows some mild age-related changes, which are expected for someone her age. Advised to call back with questions as he had none at this time and was appreciative.

## 2022-02-11 NOTE — Progress Notes (Signed)
See telephone note from 02/09/22

## 2022-02-12 DIAGNOSIS — D631 Anemia in chronic kidney disease: Secondary | ICD-10-CM | POA: Diagnosis not present

## 2022-02-12 DIAGNOSIS — N186 End stage renal disease: Secondary | ICD-10-CM | POA: Diagnosis not present

## 2022-02-12 DIAGNOSIS — N2581 Secondary hyperparathyroidism of renal origin: Secondary | ICD-10-CM | POA: Diagnosis not present

## 2022-02-12 DIAGNOSIS — N189 Chronic kidney disease, unspecified: Secondary | ICD-10-CM | POA: Diagnosis not present

## 2022-02-12 DIAGNOSIS — Z992 Dependence on renal dialysis: Secondary | ICD-10-CM | POA: Diagnosis not present

## 2022-02-12 DIAGNOSIS — E1122 Type 2 diabetes mellitus with diabetic chronic kidney disease: Secondary | ICD-10-CM | POA: Diagnosis not present

## 2022-02-14 DIAGNOSIS — E1122 Type 2 diabetes mellitus with diabetic chronic kidney disease: Secondary | ICD-10-CM | POA: Diagnosis not present

## 2022-02-14 DIAGNOSIS — N186 End stage renal disease: Secondary | ICD-10-CM | POA: Diagnosis not present

## 2022-02-14 DIAGNOSIS — N2581 Secondary hyperparathyroidism of renal origin: Secondary | ICD-10-CM | POA: Diagnosis not present

## 2022-02-14 DIAGNOSIS — N189 Chronic kidney disease, unspecified: Secondary | ICD-10-CM | POA: Diagnosis not present

## 2022-02-14 DIAGNOSIS — D631 Anemia in chronic kidney disease: Secondary | ICD-10-CM | POA: Diagnosis not present

## 2022-02-14 DIAGNOSIS — Z992 Dependence on renal dialysis: Secondary | ICD-10-CM | POA: Diagnosis not present

## 2022-02-19 DIAGNOSIS — N189 Chronic kidney disease, unspecified: Secondary | ICD-10-CM | POA: Diagnosis not present

## 2022-02-19 DIAGNOSIS — D631 Anemia in chronic kidney disease: Secondary | ICD-10-CM | POA: Diagnosis not present

## 2022-02-19 DIAGNOSIS — E1122 Type 2 diabetes mellitus with diabetic chronic kidney disease: Secondary | ICD-10-CM | POA: Diagnosis not present

## 2022-02-19 DIAGNOSIS — N2581 Secondary hyperparathyroidism of renal origin: Secondary | ICD-10-CM | POA: Diagnosis not present

## 2022-02-19 DIAGNOSIS — N186 End stage renal disease: Secondary | ICD-10-CM | POA: Diagnosis not present

## 2022-02-19 DIAGNOSIS — Z992 Dependence on renal dialysis: Secondary | ICD-10-CM | POA: Diagnosis not present

## 2022-02-21 DIAGNOSIS — N189 Chronic kidney disease, unspecified: Secondary | ICD-10-CM | POA: Diagnosis not present

## 2022-02-21 DIAGNOSIS — N2581 Secondary hyperparathyroidism of renal origin: Secondary | ICD-10-CM | POA: Diagnosis not present

## 2022-02-21 DIAGNOSIS — D631 Anemia in chronic kidney disease: Secondary | ICD-10-CM | POA: Diagnosis not present

## 2022-02-21 DIAGNOSIS — N186 End stage renal disease: Secondary | ICD-10-CM | POA: Diagnosis not present

## 2022-02-21 DIAGNOSIS — Z992 Dependence on renal dialysis: Secondary | ICD-10-CM | POA: Diagnosis not present

## 2022-02-21 DIAGNOSIS — E1122 Type 2 diabetes mellitus with diabetic chronic kidney disease: Secondary | ICD-10-CM | POA: Diagnosis not present

## 2022-02-23 DIAGNOSIS — N186 End stage renal disease: Secondary | ICD-10-CM | POA: Diagnosis not present

## 2022-02-23 DIAGNOSIS — E1129 Type 2 diabetes mellitus with other diabetic kidney complication: Secondary | ICD-10-CM | POA: Diagnosis not present

## 2022-02-23 DIAGNOSIS — Z992 Dependence on renal dialysis: Secondary | ICD-10-CM | POA: Diagnosis not present

## 2022-02-24 DIAGNOSIS — E1122 Type 2 diabetes mellitus with diabetic chronic kidney disease: Secondary | ICD-10-CM | POA: Diagnosis not present

## 2022-02-24 DIAGNOSIS — N186 End stage renal disease: Secondary | ICD-10-CM | POA: Diagnosis not present

## 2022-02-24 DIAGNOSIS — Z992 Dependence on renal dialysis: Secondary | ICD-10-CM | POA: Diagnosis not present

## 2022-02-24 DIAGNOSIS — D631 Anemia in chronic kidney disease: Secondary | ICD-10-CM | POA: Diagnosis not present

## 2022-02-24 DIAGNOSIS — D509 Iron deficiency anemia, unspecified: Secondary | ICD-10-CM | POA: Diagnosis not present

## 2022-02-24 DIAGNOSIS — N2581 Secondary hyperparathyroidism of renal origin: Secondary | ICD-10-CM | POA: Diagnosis not present

## 2022-02-24 DIAGNOSIS — N189 Chronic kidney disease, unspecified: Secondary | ICD-10-CM | POA: Diagnosis not present

## 2022-02-27 NOTE — Progress Notes (Signed)
Error.   No Show.   Rex Kras, [place], [facility]  Pager: [phone] Office: [phone]

## 2022-02-28 DIAGNOSIS — N186 End stage renal disease: Secondary | ICD-10-CM | POA: Diagnosis not present

## 2022-02-28 DIAGNOSIS — D509 Iron deficiency anemia, unspecified: Secondary | ICD-10-CM | POA: Diagnosis not present

## 2022-02-28 DIAGNOSIS — Z992 Dependence on renal dialysis: Secondary | ICD-10-CM | POA: Diagnosis not present

## 2022-02-28 DIAGNOSIS — E1122 Type 2 diabetes mellitus with diabetic chronic kidney disease: Secondary | ICD-10-CM | POA: Diagnosis not present

## 2022-02-28 DIAGNOSIS — N2581 Secondary hyperparathyroidism of renal origin: Secondary | ICD-10-CM | POA: Diagnosis not present

## 2022-02-28 DIAGNOSIS — N189 Chronic kidney disease, unspecified: Secondary | ICD-10-CM | POA: Diagnosis not present

## 2022-03-03 DIAGNOSIS — N2581 Secondary hyperparathyroidism of renal origin: Secondary | ICD-10-CM | POA: Diagnosis not present

## 2022-03-03 DIAGNOSIS — N189 Chronic kidney disease, unspecified: Secondary | ICD-10-CM | POA: Diagnosis not present

## 2022-03-03 DIAGNOSIS — N186 End stage renal disease: Secondary | ICD-10-CM | POA: Diagnosis not present

## 2022-03-03 DIAGNOSIS — D509 Iron deficiency anemia, unspecified: Secondary | ICD-10-CM | POA: Diagnosis not present

## 2022-03-03 DIAGNOSIS — E1122 Type 2 diabetes mellitus with diabetic chronic kidney disease: Secondary | ICD-10-CM | POA: Diagnosis not present

## 2022-03-03 DIAGNOSIS — Z992 Dependence on renal dialysis: Secondary | ICD-10-CM | POA: Diagnosis not present

## 2022-03-05 DIAGNOSIS — E1122 Type 2 diabetes mellitus with diabetic chronic kidney disease: Secondary | ICD-10-CM | POA: Diagnosis not present

## 2022-03-05 DIAGNOSIS — N2581 Secondary hyperparathyroidism of renal origin: Secondary | ICD-10-CM | POA: Diagnosis not present

## 2022-03-05 DIAGNOSIS — N189 Chronic kidney disease, unspecified: Secondary | ICD-10-CM | POA: Diagnosis not present

## 2022-03-05 DIAGNOSIS — N186 End stage renal disease: Secondary | ICD-10-CM | POA: Diagnosis not present

## 2022-03-05 DIAGNOSIS — Z992 Dependence on renal dialysis: Secondary | ICD-10-CM | POA: Diagnosis not present

## 2022-03-05 DIAGNOSIS — D509 Iron deficiency anemia, unspecified: Secondary | ICD-10-CM | POA: Diagnosis not present

## 2022-03-07 DIAGNOSIS — E1122 Type 2 diabetes mellitus with diabetic chronic kidney disease: Secondary | ICD-10-CM | POA: Diagnosis not present

## 2022-03-07 DIAGNOSIS — N186 End stage renal disease: Secondary | ICD-10-CM | POA: Diagnosis not present

## 2022-03-07 DIAGNOSIS — Z992 Dependence on renal dialysis: Secondary | ICD-10-CM | POA: Diagnosis not present

## 2022-03-07 DIAGNOSIS — D509 Iron deficiency anemia, unspecified: Secondary | ICD-10-CM | POA: Diagnosis not present

## 2022-03-07 DIAGNOSIS — N189 Chronic kidney disease, unspecified: Secondary | ICD-10-CM | POA: Diagnosis not present

## 2022-03-07 DIAGNOSIS — N2581 Secondary hyperparathyroidism of renal origin: Secondary | ICD-10-CM | POA: Diagnosis not present

## 2022-03-10 DIAGNOSIS — E1122 Type 2 diabetes mellitus with diabetic chronic kidney disease: Secondary | ICD-10-CM | POA: Diagnosis not present

## 2022-03-10 DIAGNOSIS — N189 Chronic kidney disease, unspecified: Secondary | ICD-10-CM | POA: Diagnosis not present

## 2022-03-10 DIAGNOSIS — D509 Iron deficiency anemia, unspecified: Secondary | ICD-10-CM | POA: Diagnosis not present

## 2022-03-10 DIAGNOSIS — N2581 Secondary hyperparathyroidism of renal origin: Secondary | ICD-10-CM | POA: Diagnosis not present

## 2022-03-10 DIAGNOSIS — Z992 Dependence on renal dialysis: Secondary | ICD-10-CM | POA: Diagnosis not present

## 2022-03-10 DIAGNOSIS — N186 End stage renal disease: Secondary | ICD-10-CM | POA: Diagnosis not present

## 2022-03-14 DIAGNOSIS — N186 End stage renal disease: Secondary | ICD-10-CM | POA: Diagnosis not present

## 2022-03-14 DIAGNOSIS — Z992 Dependence on renal dialysis: Secondary | ICD-10-CM | POA: Diagnosis not present

## 2022-03-14 DIAGNOSIS — E1122 Type 2 diabetes mellitus with diabetic chronic kidney disease: Secondary | ICD-10-CM | POA: Diagnosis not present

## 2022-03-14 DIAGNOSIS — N2581 Secondary hyperparathyroidism of renal origin: Secondary | ICD-10-CM | POA: Diagnosis not present

## 2022-03-14 DIAGNOSIS — N189 Chronic kidney disease, unspecified: Secondary | ICD-10-CM | POA: Diagnosis not present

## 2022-03-14 DIAGNOSIS — D509 Iron deficiency anemia, unspecified: Secondary | ICD-10-CM | POA: Diagnosis not present

## 2022-03-17 DIAGNOSIS — E1122 Type 2 diabetes mellitus with diabetic chronic kidney disease: Secondary | ICD-10-CM | POA: Diagnosis not present

## 2022-03-17 DIAGNOSIS — Z992 Dependence on renal dialysis: Secondary | ICD-10-CM | POA: Diagnosis not present

## 2022-03-17 DIAGNOSIS — N2581 Secondary hyperparathyroidism of renal origin: Secondary | ICD-10-CM | POA: Diagnosis not present

## 2022-03-17 DIAGNOSIS — N189 Chronic kidney disease, unspecified: Secondary | ICD-10-CM | POA: Diagnosis not present

## 2022-03-17 DIAGNOSIS — D509 Iron deficiency anemia, unspecified: Secondary | ICD-10-CM | POA: Diagnosis not present

## 2022-03-17 DIAGNOSIS — N186 End stage renal disease: Secondary | ICD-10-CM | POA: Diagnosis not present

## 2022-03-19 DIAGNOSIS — E1122 Type 2 diabetes mellitus with diabetic chronic kidney disease: Secondary | ICD-10-CM | POA: Diagnosis not present

## 2022-03-19 DIAGNOSIS — N2581 Secondary hyperparathyroidism of renal origin: Secondary | ICD-10-CM | POA: Diagnosis not present

## 2022-03-19 DIAGNOSIS — D509 Iron deficiency anemia, unspecified: Secondary | ICD-10-CM | POA: Diagnosis not present

## 2022-03-19 DIAGNOSIS — N186 End stage renal disease: Secondary | ICD-10-CM | POA: Diagnosis not present

## 2022-03-19 DIAGNOSIS — N189 Chronic kidney disease, unspecified: Secondary | ICD-10-CM | POA: Diagnosis not present

## 2022-03-19 DIAGNOSIS — Z992 Dependence on renal dialysis: Secondary | ICD-10-CM | POA: Diagnosis not present

## 2022-03-20 DIAGNOSIS — G4700 Insomnia, unspecified: Secondary | ICD-10-CM | POA: Diagnosis not present

## 2022-03-20 DIAGNOSIS — F331 Major depressive disorder, recurrent, moderate: Secondary | ICD-10-CM | POA: Diagnosis not present

## 2022-03-20 DIAGNOSIS — R634 Abnormal weight loss: Secondary | ICD-10-CM | POA: Diagnosis not present

## 2022-03-20 DIAGNOSIS — F411 Generalized anxiety disorder: Secondary | ICD-10-CM | POA: Diagnosis not present

## 2022-03-20 DIAGNOSIS — I10 Essential (primary) hypertension: Secondary | ICD-10-CM | POA: Diagnosis not present

## 2022-03-21 DIAGNOSIS — Z992 Dependence on renal dialysis: Secondary | ICD-10-CM | POA: Diagnosis not present

## 2022-03-21 DIAGNOSIS — E1122 Type 2 diabetes mellitus with diabetic chronic kidney disease: Secondary | ICD-10-CM | POA: Diagnosis not present

## 2022-03-21 DIAGNOSIS — D509 Iron deficiency anemia, unspecified: Secondary | ICD-10-CM | POA: Diagnosis not present

## 2022-03-21 DIAGNOSIS — N186 End stage renal disease: Secondary | ICD-10-CM | POA: Diagnosis not present

## 2022-03-21 DIAGNOSIS — N2581 Secondary hyperparathyroidism of renal origin: Secondary | ICD-10-CM | POA: Diagnosis not present

## 2022-03-21 DIAGNOSIS — N189 Chronic kidney disease, unspecified: Secondary | ICD-10-CM | POA: Diagnosis not present

## 2022-03-24 DIAGNOSIS — N186 End stage renal disease: Secondary | ICD-10-CM | POA: Diagnosis not present

## 2022-03-24 DIAGNOSIS — Z992 Dependence on renal dialysis: Secondary | ICD-10-CM | POA: Diagnosis not present

## 2022-03-24 DIAGNOSIS — E1122 Type 2 diabetes mellitus with diabetic chronic kidney disease: Secondary | ICD-10-CM | POA: Diagnosis not present

## 2022-03-24 DIAGNOSIS — D509 Iron deficiency anemia, unspecified: Secondary | ICD-10-CM | POA: Diagnosis not present

## 2022-03-24 DIAGNOSIS — N2581 Secondary hyperparathyroidism of renal origin: Secondary | ICD-10-CM | POA: Diagnosis not present

## 2022-03-24 DIAGNOSIS — N189 Chronic kidney disease, unspecified: Secondary | ICD-10-CM | POA: Diagnosis not present

## 2022-03-26 DIAGNOSIS — E1129 Type 2 diabetes mellitus with other diabetic kidney complication: Secondary | ICD-10-CM | POA: Diagnosis not present

## 2022-03-26 DIAGNOSIS — N189 Chronic kidney disease, unspecified: Secondary | ICD-10-CM | POA: Diagnosis not present

## 2022-03-26 DIAGNOSIS — N2581 Secondary hyperparathyroidism of renal origin: Secondary | ICD-10-CM | POA: Diagnosis not present

## 2022-03-26 DIAGNOSIS — E1122 Type 2 diabetes mellitus with diabetic chronic kidney disease: Secondary | ICD-10-CM | POA: Diagnosis not present

## 2022-03-26 DIAGNOSIS — N186 End stage renal disease: Secondary | ICD-10-CM | POA: Diagnosis not present

## 2022-03-26 DIAGNOSIS — D509 Iron deficiency anemia, unspecified: Secondary | ICD-10-CM | POA: Diagnosis not present

## 2022-03-26 DIAGNOSIS — Z992 Dependence on renal dialysis: Secondary | ICD-10-CM | POA: Diagnosis not present

## 2022-03-26 DIAGNOSIS — D631 Anemia in chronic kidney disease: Secondary | ICD-10-CM | POA: Diagnosis not present

## 2022-03-28 DIAGNOSIS — D509 Iron deficiency anemia, unspecified: Secondary | ICD-10-CM | POA: Diagnosis not present

## 2022-03-28 DIAGNOSIS — D631 Anemia in chronic kidney disease: Secondary | ICD-10-CM | POA: Diagnosis not present

## 2022-03-28 DIAGNOSIS — N186 End stage renal disease: Secondary | ICD-10-CM | POA: Diagnosis not present

## 2022-03-28 DIAGNOSIS — N2581 Secondary hyperparathyroidism of renal origin: Secondary | ICD-10-CM | POA: Diagnosis not present

## 2022-03-28 DIAGNOSIS — Z992 Dependence on renal dialysis: Secondary | ICD-10-CM | POA: Diagnosis not present

## 2022-03-28 DIAGNOSIS — N189 Chronic kidney disease, unspecified: Secondary | ICD-10-CM | POA: Diagnosis not present

## 2022-03-31 DIAGNOSIS — Z992 Dependence on renal dialysis: Secondary | ICD-10-CM | POA: Diagnosis not present

## 2022-03-31 DIAGNOSIS — D509 Iron deficiency anemia, unspecified: Secondary | ICD-10-CM | POA: Diagnosis not present

## 2022-03-31 DIAGNOSIS — N2581 Secondary hyperparathyroidism of renal origin: Secondary | ICD-10-CM | POA: Diagnosis not present

## 2022-03-31 DIAGNOSIS — N189 Chronic kidney disease, unspecified: Secondary | ICD-10-CM | POA: Diagnosis not present

## 2022-03-31 DIAGNOSIS — N186 End stage renal disease: Secondary | ICD-10-CM | POA: Diagnosis not present

## 2022-03-31 DIAGNOSIS — D631 Anemia in chronic kidney disease: Secondary | ICD-10-CM | POA: Diagnosis not present

## 2022-04-02 DIAGNOSIS — D509 Iron deficiency anemia, unspecified: Secondary | ICD-10-CM | POA: Diagnosis not present

## 2022-04-02 DIAGNOSIS — Z992 Dependence on renal dialysis: Secondary | ICD-10-CM | POA: Diagnosis not present

## 2022-04-02 DIAGNOSIS — N186 End stage renal disease: Secondary | ICD-10-CM | POA: Diagnosis not present

## 2022-04-02 DIAGNOSIS — N189 Chronic kidney disease, unspecified: Secondary | ICD-10-CM | POA: Diagnosis not present

## 2022-04-02 DIAGNOSIS — N2581 Secondary hyperparathyroidism of renal origin: Secondary | ICD-10-CM | POA: Diagnosis not present

## 2022-04-02 DIAGNOSIS — D631 Anemia in chronic kidney disease: Secondary | ICD-10-CM | POA: Diagnosis not present

## 2022-04-07 DIAGNOSIS — D509 Iron deficiency anemia, unspecified: Secondary | ICD-10-CM | POA: Diagnosis not present

## 2022-04-07 DIAGNOSIS — N2581 Secondary hyperparathyroidism of renal origin: Secondary | ICD-10-CM | POA: Diagnosis not present

## 2022-04-07 DIAGNOSIS — N189 Chronic kidney disease, unspecified: Secondary | ICD-10-CM | POA: Diagnosis not present

## 2022-04-07 DIAGNOSIS — D631 Anemia in chronic kidney disease: Secondary | ICD-10-CM | POA: Diagnosis not present

## 2022-04-07 DIAGNOSIS — Z992 Dependence on renal dialysis: Secondary | ICD-10-CM | POA: Diagnosis not present

## 2022-04-07 DIAGNOSIS — N186 End stage renal disease: Secondary | ICD-10-CM | POA: Diagnosis not present

## 2022-04-09 DIAGNOSIS — N2581 Secondary hyperparathyroidism of renal origin: Secondary | ICD-10-CM | POA: Diagnosis not present

## 2022-04-09 DIAGNOSIS — Z992 Dependence on renal dialysis: Secondary | ICD-10-CM | POA: Diagnosis not present

## 2022-04-09 DIAGNOSIS — D631 Anemia in chronic kidney disease: Secondary | ICD-10-CM | POA: Diagnosis not present

## 2022-04-09 DIAGNOSIS — N186 End stage renal disease: Secondary | ICD-10-CM | POA: Diagnosis not present

## 2022-04-09 DIAGNOSIS — N189 Chronic kidney disease, unspecified: Secondary | ICD-10-CM | POA: Diagnosis not present

## 2022-04-09 DIAGNOSIS — D509 Iron deficiency anemia, unspecified: Secondary | ICD-10-CM | POA: Diagnosis not present

## 2022-04-11 DIAGNOSIS — N189 Chronic kidney disease, unspecified: Secondary | ICD-10-CM | POA: Diagnosis not present

## 2022-04-11 DIAGNOSIS — N186 End stage renal disease: Secondary | ICD-10-CM | POA: Diagnosis not present

## 2022-04-11 DIAGNOSIS — N2581 Secondary hyperparathyroidism of renal origin: Secondary | ICD-10-CM | POA: Diagnosis not present

## 2022-04-11 DIAGNOSIS — D631 Anemia in chronic kidney disease: Secondary | ICD-10-CM | POA: Diagnosis not present

## 2022-04-11 DIAGNOSIS — D509 Iron deficiency anemia, unspecified: Secondary | ICD-10-CM | POA: Diagnosis not present

## 2022-04-11 DIAGNOSIS — Z992 Dependence on renal dialysis: Secondary | ICD-10-CM | POA: Diagnosis not present

## 2022-04-13 DIAGNOSIS — F411 Generalized anxiety disorder: Secondary | ICD-10-CM | POA: Diagnosis not present

## 2022-04-13 DIAGNOSIS — R634 Abnormal weight loss: Secondary | ICD-10-CM | POA: Diagnosis not present

## 2022-04-13 DIAGNOSIS — I10 Essential (primary) hypertension: Secondary | ICD-10-CM | POA: Diagnosis not present

## 2022-04-13 DIAGNOSIS — F331 Major depressive disorder, recurrent, moderate: Secondary | ICD-10-CM | POA: Diagnosis not present

## 2022-04-13 DIAGNOSIS — G4700 Insomnia, unspecified: Secondary | ICD-10-CM | POA: Diagnosis not present

## 2022-04-14 DIAGNOSIS — Z992 Dependence on renal dialysis: Secondary | ICD-10-CM | POA: Diagnosis not present

## 2022-04-14 DIAGNOSIS — N186 End stage renal disease: Secondary | ICD-10-CM | POA: Diagnosis not present

## 2022-04-14 DIAGNOSIS — D509 Iron deficiency anemia, unspecified: Secondary | ICD-10-CM | POA: Diagnosis not present

## 2022-04-14 DIAGNOSIS — N189 Chronic kidney disease, unspecified: Secondary | ICD-10-CM | POA: Diagnosis not present

## 2022-04-14 DIAGNOSIS — D631 Anemia in chronic kidney disease: Secondary | ICD-10-CM | POA: Diagnosis not present

## 2022-04-14 DIAGNOSIS — N2581 Secondary hyperparathyroidism of renal origin: Secondary | ICD-10-CM | POA: Diagnosis not present

## 2022-04-16 DIAGNOSIS — D631 Anemia in chronic kidney disease: Secondary | ICD-10-CM | POA: Diagnosis not present

## 2022-04-16 DIAGNOSIS — N186 End stage renal disease: Secondary | ICD-10-CM | POA: Diagnosis not present

## 2022-04-16 DIAGNOSIS — E1122 Type 2 diabetes mellitus with diabetic chronic kidney disease: Secondary | ICD-10-CM | POA: Diagnosis not present

## 2022-04-16 DIAGNOSIS — Z992 Dependence on renal dialysis: Secondary | ICD-10-CM | POA: Diagnosis not present

## 2022-04-16 DIAGNOSIS — D509 Iron deficiency anemia, unspecified: Secondary | ICD-10-CM | POA: Diagnosis not present

## 2022-04-16 DIAGNOSIS — N2581 Secondary hyperparathyroidism of renal origin: Secondary | ICD-10-CM | POA: Diagnosis not present

## 2022-04-16 DIAGNOSIS — N189 Chronic kidney disease, unspecified: Secondary | ICD-10-CM | POA: Diagnosis not present

## 2022-04-18 DIAGNOSIS — Z992 Dependence on renal dialysis: Secondary | ICD-10-CM | POA: Diagnosis not present

## 2022-04-18 DIAGNOSIS — D509 Iron deficiency anemia, unspecified: Secondary | ICD-10-CM | POA: Diagnosis not present

## 2022-04-18 DIAGNOSIS — D631 Anemia in chronic kidney disease: Secondary | ICD-10-CM | POA: Diagnosis not present

## 2022-04-18 DIAGNOSIS — N186 End stage renal disease: Secondary | ICD-10-CM | POA: Diagnosis not present

## 2022-04-18 DIAGNOSIS — N189 Chronic kidney disease, unspecified: Secondary | ICD-10-CM | POA: Diagnosis not present

## 2022-04-18 DIAGNOSIS — N2581 Secondary hyperparathyroidism of renal origin: Secondary | ICD-10-CM | POA: Diagnosis not present

## 2022-04-21 DIAGNOSIS — D631 Anemia in chronic kidney disease: Secondary | ICD-10-CM | POA: Diagnosis not present

## 2022-04-21 DIAGNOSIS — N189 Chronic kidney disease, unspecified: Secondary | ICD-10-CM | POA: Diagnosis not present

## 2022-04-21 DIAGNOSIS — D509 Iron deficiency anemia, unspecified: Secondary | ICD-10-CM | POA: Diagnosis not present

## 2022-04-21 DIAGNOSIS — Z992 Dependence on renal dialysis: Secondary | ICD-10-CM | POA: Diagnosis not present

## 2022-04-21 DIAGNOSIS — N2581 Secondary hyperparathyroidism of renal origin: Secondary | ICD-10-CM | POA: Diagnosis not present

## 2022-04-21 DIAGNOSIS — N186 End stage renal disease: Secondary | ICD-10-CM | POA: Diagnosis not present

## 2022-04-23 DIAGNOSIS — N2581 Secondary hyperparathyroidism of renal origin: Secondary | ICD-10-CM | POA: Diagnosis not present

## 2022-04-23 DIAGNOSIS — D509 Iron deficiency anemia, unspecified: Secondary | ICD-10-CM | POA: Diagnosis not present

## 2022-04-23 DIAGNOSIS — N186 End stage renal disease: Secondary | ICD-10-CM | POA: Diagnosis not present

## 2022-04-23 DIAGNOSIS — N189 Chronic kidney disease, unspecified: Secondary | ICD-10-CM | POA: Diagnosis not present

## 2022-04-23 DIAGNOSIS — Z992 Dependence on renal dialysis: Secondary | ICD-10-CM | POA: Diagnosis not present

## 2022-04-23 DIAGNOSIS — D631 Anemia in chronic kidney disease: Secondary | ICD-10-CM | POA: Diagnosis not present

## 2022-04-24 DIAGNOSIS — Z992 Dependence on renal dialysis: Secondary | ICD-10-CM | POA: Diagnosis not present

## 2022-04-24 DIAGNOSIS — E1165 Type 2 diabetes mellitus with hyperglycemia: Secondary | ICD-10-CM | POA: Diagnosis not present

## 2022-04-24 DIAGNOSIS — N186 End stage renal disease: Secondary | ICD-10-CM | POA: Diagnosis not present

## 2022-04-24 DIAGNOSIS — E1129 Type 2 diabetes mellitus with other diabetic kidney complication: Secondary | ICD-10-CM | POA: Diagnosis not present

## 2022-04-24 DIAGNOSIS — I10 Essential (primary) hypertension: Secondary | ICD-10-CM | POA: Diagnosis not present

## 2022-04-25 DIAGNOSIS — D509 Iron deficiency anemia, unspecified: Secondary | ICD-10-CM | POA: Diagnosis not present

## 2022-04-25 DIAGNOSIS — N186 End stage renal disease: Secondary | ICD-10-CM | POA: Diagnosis not present

## 2022-04-25 DIAGNOSIS — E1122 Type 2 diabetes mellitus with diabetic chronic kidney disease: Secondary | ICD-10-CM | POA: Diagnosis not present

## 2022-04-25 DIAGNOSIS — N2581 Secondary hyperparathyroidism of renal origin: Secondary | ICD-10-CM | POA: Diagnosis not present

## 2022-04-25 DIAGNOSIS — Z992 Dependence on renal dialysis: Secondary | ICD-10-CM | POA: Diagnosis not present

## 2022-04-25 DIAGNOSIS — N189 Chronic kidney disease, unspecified: Secondary | ICD-10-CM | POA: Diagnosis not present

## 2022-04-25 DIAGNOSIS — D631 Anemia in chronic kidney disease: Secondary | ICD-10-CM | POA: Diagnosis not present

## 2022-04-28 DIAGNOSIS — Z992 Dependence on renal dialysis: Secondary | ICD-10-CM | POA: Diagnosis not present

## 2022-04-28 DIAGNOSIS — N2581 Secondary hyperparathyroidism of renal origin: Secondary | ICD-10-CM | POA: Diagnosis not present

## 2022-04-28 DIAGNOSIS — N186 End stage renal disease: Secondary | ICD-10-CM | POA: Diagnosis not present

## 2022-04-28 DIAGNOSIS — E1122 Type 2 diabetes mellitus with diabetic chronic kidney disease: Secondary | ICD-10-CM | POA: Diagnosis not present

## 2022-04-28 DIAGNOSIS — D509 Iron deficiency anemia, unspecified: Secondary | ICD-10-CM | POA: Diagnosis not present

## 2022-04-28 DIAGNOSIS — N189 Chronic kidney disease, unspecified: Secondary | ICD-10-CM | POA: Diagnosis not present

## 2022-04-29 DIAGNOSIS — F331 Major depressive disorder, recurrent, moderate: Secondary | ICD-10-CM | POA: Diagnosis not present

## 2022-04-29 DIAGNOSIS — I10 Essential (primary) hypertension: Secondary | ICD-10-CM | POA: Diagnosis not present

## 2022-04-29 DIAGNOSIS — I129 Hypertensive chronic kidney disease with stage 1 through stage 4 chronic kidney disease, or unspecified chronic kidney disease: Secondary | ICD-10-CM | POA: Diagnosis not present

## 2022-04-29 DIAGNOSIS — E876 Hypokalemia: Secondary | ICD-10-CM | POA: Diagnosis not present

## 2022-04-29 DIAGNOSIS — E1122 Type 2 diabetes mellitus with diabetic chronic kidney disease: Secondary | ICD-10-CM | POA: Diagnosis not present

## 2022-04-30 DIAGNOSIS — N2581 Secondary hyperparathyroidism of renal origin: Secondary | ICD-10-CM | POA: Diagnosis not present

## 2022-04-30 DIAGNOSIS — D509 Iron deficiency anemia, unspecified: Secondary | ICD-10-CM | POA: Diagnosis not present

## 2022-04-30 DIAGNOSIS — N189 Chronic kidney disease, unspecified: Secondary | ICD-10-CM | POA: Diagnosis not present

## 2022-04-30 DIAGNOSIS — N186 End stage renal disease: Secondary | ICD-10-CM | POA: Diagnosis not present

## 2022-04-30 DIAGNOSIS — E1122 Type 2 diabetes mellitus with diabetic chronic kidney disease: Secondary | ICD-10-CM | POA: Diagnosis not present

## 2022-04-30 DIAGNOSIS — Z992 Dependence on renal dialysis: Secondary | ICD-10-CM | POA: Diagnosis not present

## 2022-05-02 DIAGNOSIS — Z992 Dependence on renal dialysis: Secondary | ICD-10-CM | POA: Diagnosis not present

## 2022-05-02 DIAGNOSIS — N186 End stage renal disease: Secondary | ICD-10-CM | POA: Diagnosis not present

## 2022-05-02 DIAGNOSIS — N2581 Secondary hyperparathyroidism of renal origin: Secondary | ICD-10-CM | POA: Diagnosis not present

## 2022-05-02 DIAGNOSIS — E1122 Type 2 diabetes mellitus with diabetic chronic kidney disease: Secondary | ICD-10-CM | POA: Diagnosis not present

## 2022-05-02 DIAGNOSIS — D509 Iron deficiency anemia, unspecified: Secondary | ICD-10-CM | POA: Diagnosis not present

## 2022-05-02 DIAGNOSIS — N189 Chronic kidney disease, unspecified: Secondary | ICD-10-CM | POA: Diagnosis not present

## 2022-05-05 DIAGNOSIS — D509 Iron deficiency anemia, unspecified: Secondary | ICD-10-CM | POA: Diagnosis not present

## 2022-05-05 DIAGNOSIS — N186 End stage renal disease: Secondary | ICD-10-CM | POA: Diagnosis not present

## 2022-05-05 DIAGNOSIS — N2581 Secondary hyperparathyroidism of renal origin: Secondary | ICD-10-CM | POA: Diagnosis not present

## 2022-05-05 DIAGNOSIS — E1122 Type 2 diabetes mellitus with diabetic chronic kidney disease: Secondary | ICD-10-CM | POA: Diagnosis not present

## 2022-05-05 DIAGNOSIS — N189 Chronic kidney disease, unspecified: Secondary | ICD-10-CM | POA: Diagnosis not present

## 2022-05-05 DIAGNOSIS — Z992 Dependence on renal dialysis: Secondary | ICD-10-CM | POA: Diagnosis not present

## 2022-05-07 DIAGNOSIS — E1122 Type 2 diabetes mellitus with diabetic chronic kidney disease: Secondary | ICD-10-CM | POA: Diagnosis not present

## 2022-05-07 DIAGNOSIS — N2581 Secondary hyperparathyroidism of renal origin: Secondary | ICD-10-CM | POA: Diagnosis not present

## 2022-05-07 DIAGNOSIS — Z992 Dependence on renal dialysis: Secondary | ICD-10-CM | POA: Diagnosis not present

## 2022-05-07 DIAGNOSIS — N189 Chronic kidney disease, unspecified: Secondary | ICD-10-CM | POA: Diagnosis not present

## 2022-05-07 DIAGNOSIS — D509 Iron deficiency anemia, unspecified: Secondary | ICD-10-CM | POA: Diagnosis not present

## 2022-05-07 DIAGNOSIS — N186 End stage renal disease: Secondary | ICD-10-CM | POA: Diagnosis not present

## 2022-05-09 DIAGNOSIS — N2581 Secondary hyperparathyroidism of renal origin: Secondary | ICD-10-CM | POA: Diagnosis not present

## 2022-05-09 DIAGNOSIS — E1122 Type 2 diabetes mellitus with diabetic chronic kidney disease: Secondary | ICD-10-CM | POA: Diagnosis not present

## 2022-05-09 DIAGNOSIS — N186 End stage renal disease: Secondary | ICD-10-CM | POA: Diagnosis not present

## 2022-05-09 DIAGNOSIS — D509 Iron deficiency anemia, unspecified: Secondary | ICD-10-CM | POA: Diagnosis not present

## 2022-05-09 DIAGNOSIS — Z992 Dependence on renal dialysis: Secondary | ICD-10-CM | POA: Diagnosis not present

## 2022-05-09 DIAGNOSIS — N189 Chronic kidney disease, unspecified: Secondary | ICD-10-CM | POA: Diagnosis not present

## 2022-05-12 DIAGNOSIS — N189 Chronic kidney disease, unspecified: Secondary | ICD-10-CM | POA: Diagnosis not present

## 2022-05-12 DIAGNOSIS — N2581 Secondary hyperparathyroidism of renal origin: Secondary | ICD-10-CM | POA: Diagnosis not present

## 2022-05-12 DIAGNOSIS — Z992 Dependence on renal dialysis: Secondary | ICD-10-CM | POA: Diagnosis not present

## 2022-05-12 DIAGNOSIS — N186 End stage renal disease: Secondary | ICD-10-CM | POA: Diagnosis not present

## 2022-05-12 DIAGNOSIS — D509 Iron deficiency anemia, unspecified: Secondary | ICD-10-CM | POA: Diagnosis not present

## 2022-05-12 DIAGNOSIS — E1122 Type 2 diabetes mellitus with diabetic chronic kidney disease: Secondary | ICD-10-CM | POA: Diagnosis not present

## 2022-05-13 DIAGNOSIS — I10 Essential (primary) hypertension: Secondary | ICD-10-CM | POA: Diagnosis not present

## 2022-05-13 DIAGNOSIS — F411 Generalized anxiety disorder: Secondary | ICD-10-CM | POA: Diagnosis not present

## 2022-05-13 DIAGNOSIS — R634 Abnormal weight loss: Secondary | ICD-10-CM | POA: Diagnosis not present

## 2022-05-13 DIAGNOSIS — F331 Major depressive disorder, recurrent, moderate: Secondary | ICD-10-CM | POA: Diagnosis not present

## 2022-05-14 DIAGNOSIS — N189 Chronic kidney disease, unspecified: Secondary | ICD-10-CM | POA: Diagnosis not present

## 2022-05-14 DIAGNOSIS — N186 End stage renal disease: Secondary | ICD-10-CM | POA: Diagnosis not present

## 2022-05-14 DIAGNOSIS — N2581 Secondary hyperparathyroidism of renal origin: Secondary | ICD-10-CM | POA: Diagnosis not present

## 2022-05-14 DIAGNOSIS — E1122 Type 2 diabetes mellitus with diabetic chronic kidney disease: Secondary | ICD-10-CM | POA: Diagnosis not present

## 2022-05-14 DIAGNOSIS — Z992 Dependence on renal dialysis: Secondary | ICD-10-CM | POA: Diagnosis not present

## 2022-05-14 DIAGNOSIS — D509 Iron deficiency anemia, unspecified: Secondary | ICD-10-CM | POA: Diagnosis not present

## 2022-05-16 DIAGNOSIS — Z992 Dependence on renal dialysis: Secondary | ICD-10-CM | POA: Diagnosis not present

## 2022-05-16 DIAGNOSIS — E1122 Type 2 diabetes mellitus with diabetic chronic kidney disease: Secondary | ICD-10-CM | POA: Diagnosis not present

## 2022-05-16 DIAGNOSIS — N189 Chronic kidney disease, unspecified: Secondary | ICD-10-CM | POA: Diagnosis not present

## 2022-05-16 DIAGNOSIS — N2581 Secondary hyperparathyroidism of renal origin: Secondary | ICD-10-CM | POA: Diagnosis not present

## 2022-05-16 DIAGNOSIS — N186 End stage renal disease: Secondary | ICD-10-CM | POA: Diagnosis not present

## 2022-05-16 DIAGNOSIS — D509 Iron deficiency anemia, unspecified: Secondary | ICD-10-CM | POA: Diagnosis not present

## 2022-05-18 ENCOUNTER — Encounter: Payer: Self-pay | Admitting: Hematology and Oncology

## 2022-05-19 DIAGNOSIS — E1122 Type 2 diabetes mellitus with diabetic chronic kidney disease: Secondary | ICD-10-CM | POA: Diagnosis not present

## 2022-05-19 DIAGNOSIS — D509 Iron deficiency anemia, unspecified: Secondary | ICD-10-CM | POA: Diagnosis not present

## 2022-05-19 DIAGNOSIS — N189 Chronic kidney disease, unspecified: Secondary | ICD-10-CM | POA: Diagnosis not present

## 2022-05-19 DIAGNOSIS — N186 End stage renal disease: Secondary | ICD-10-CM | POA: Diagnosis not present

## 2022-05-19 DIAGNOSIS — Z992 Dependence on renal dialysis: Secondary | ICD-10-CM | POA: Diagnosis not present

## 2022-05-19 DIAGNOSIS — N2581 Secondary hyperparathyroidism of renal origin: Secondary | ICD-10-CM | POA: Diagnosis not present

## 2022-05-21 DIAGNOSIS — Z992 Dependence on renal dialysis: Secondary | ICD-10-CM | POA: Diagnosis not present

## 2022-05-21 DIAGNOSIS — E1122 Type 2 diabetes mellitus with diabetic chronic kidney disease: Secondary | ICD-10-CM | POA: Diagnosis not present

## 2022-05-21 DIAGNOSIS — D509 Iron deficiency anemia, unspecified: Secondary | ICD-10-CM | POA: Diagnosis not present

## 2022-05-21 DIAGNOSIS — N186 End stage renal disease: Secondary | ICD-10-CM | POA: Diagnosis not present

## 2022-05-21 DIAGNOSIS — N2581 Secondary hyperparathyroidism of renal origin: Secondary | ICD-10-CM | POA: Diagnosis not present

## 2022-05-21 DIAGNOSIS — N189 Chronic kidney disease, unspecified: Secondary | ICD-10-CM | POA: Diagnosis not present

## 2022-05-22 NOTE — Progress Notes (Deleted)
   CC:  memory loss  Follow-up Visit  Last visit: 12/22/21  Brief HPI: 72 year old female with a history of CVA, ESRD on dialysis, HLD, anxiety, HTN, DM2, diverticulitis, GERD who follows in clinic for dementia. CTH revealed a remote lacunar infarct.   At her last visit MRI brain was ordered. She was started on ASA and atorvastatin. Donepezil was started for memory. Interval History: *** MRI brain showed multiple remote lacunar infarcts as well as mild chronic small vessel disease and generalized cortical atrophy.   Physical Exam:   Vital Signs: LMP  (LMP Unknown)  GENERAL:  well appearing, in no acute distress, alert  SKIN:  Color, texture, turgor normal. No rashes or lesions HEAD:  Normocephalic/atraumatic. RESP: normal respiratory effort MSK:  No gross joint deformities.   NEUROLOGICAL: Mental Status: Alert, oriented to person, place and time, Follows commands, and Speech fluent and appropriate. Cranial Nerves: PERRL, face symmetric, no dysarthria, hearing grossly intact Motor: moves all extremities equally Gait: normal-based.  IMPRESSION: ***  PLAN: ***   Follow-up: ***  I spent a total of *** minutes on the date of the service. Discussed medication side effects, adverse reactions and drug interactions. Written educational materials and patient instructions outlining all of the above were given.  Genia Harold, MD

## 2022-05-25 ENCOUNTER — Ambulatory Visit: Payer: Medicare Other | Admitting: Psychiatry

## 2022-05-25 ENCOUNTER — Encounter: Payer: Self-pay | Admitting: Psychiatry

## 2022-05-25 DIAGNOSIS — N186 End stage renal disease: Secondary | ICD-10-CM | POA: Diagnosis not present

## 2022-05-25 DIAGNOSIS — Z992 Dependence on renal dialysis: Secondary | ICD-10-CM | POA: Diagnosis not present

## 2022-05-25 DIAGNOSIS — E1129 Type 2 diabetes mellitus with other diabetic kidney complication: Secondary | ICD-10-CM | POA: Diagnosis not present

## 2022-05-26 DIAGNOSIS — R110 Nausea: Secondary | ICD-10-CM | POA: Diagnosis not present

## 2022-05-26 DIAGNOSIS — E1122 Type 2 diabetes mellitus with diabetic chronic kidney disease: Secondary | ICD-10-CM | POA: Diagnosis not present

## 2022-05-26 DIAGNOSIS — Z992 Dependence on renal dialysis: Secondary | ICD-10-CM | POA: Diagnosis not present

## 2022-05-26 DIAGNOSIS — D509 Iron deficiency anemia, unspecified: Secondary | ICD-10-CM | POA: Diagnosis not present

## 2022-05-26 DIAGNOSIS — D631 Anemia in chronic kidney disease: Secondary | ICD-10-CM | POA: Diagnosis not present

## 2022-05-26 DIAGNOSIS — N186 End stage renal disease: Secondary | ICD-10-CM | POA: Diagnosis not present

## 2022-05-26 DIAGNOSIS — N189 Chronic kidney disease, unspecified: Secondary | ICD-10-CM | POA: Diagnosis not present

## 2022-05-26 DIAGNOSIS — N2581 Secondary hyperparathyroidism of renal origin: Secondary | ICD-10-CM | POA: Diagnosis not present

## 2022-05-28 DIAGNOSIS — N186 End stage renal disease: Secondary | ICD-10-CM | POA: Diagnosis not present

## 2022-05-28 DIAGNOSIS — D509 Iron deficiency anemia, unspecified: Secondary | ICD-10-CM | POA: Diagnosis not present

## 2022-05-28 DIAGNOSIS — N189 Chronic kidney disease, unspecified: Secondary | ICD-10-CM | POA: Diagnosis not present

## 2022-05-28 DIAGNOSIS — Z992 Dependence on renal dialysis: Secondary | ICD-10-CM | POA: Diagnosis not present

## 2022-05-28 DIAGNOSIS — N2581 Secondary hyperparathyroidism of renal origin: Secondary | ICD-10-CM | POA: Diagnosis not present

## 2022-05-28 DIAGNOSIS — R110 Nausea: Secondary | ICD-10-CM | POA: Diagnosis not present

## 2022-05-30 DIAGNOSIS — N2581 Secondary hyperparathyroidism of renal origin: Secondary | ICD-10-CM | POA: Diagnosis not present

## 2022-05-30 DIAGNOSIS — N189 Chronic kidney disease, unspecified: Secondary | ICD-10-CM | POA: Diagnosis not present

## 2022-05-30 DIAGNOSIS — D509 Iron deficiency anemia, unspecified: Secondary | ICD-10-CM | POA: Diagnosis not present

## 2022-05-30 DIAGNOSIS — N186 End stage renal disease: Secondary | ICD-10-CM | POA: Diagnosis not present

## 2022-05-30 DIAGNOSIS — Z992 Dependence on renal dialysis: Secondary | ICD-10-CM | POA: Diagnosis not present

## 2022-05-30 DIAGNOSIS — R110 Nausea: Secondary | ICD-10-CM | POA: Diagnosis not present

## 2022-06-04 DIAGNOSIS — R110 Nausea: Secondary | ICD-10-CM | POA: Diagnosis not present

## 2022-06-04 DIAGNOSIS — Z992 Dependence on renal dialysis: Secondary | ICD-10-CM | POA: Diagnosis not present

## 2022-06-04 DIAGNOSIS — N2581 Secondary hyperparathyroidism of renal origin: Secondary | ICD-10-CM | POA: Diagnosis not present

## 2022-06-04 DIAGNOSIS — N186 End stage renal disease: Secondary | ICD-10-CM | POA: Diagnosis not present

## 2022-06-04 DIAGNOSIS — N189 Chronic kidney disease, unspecified: Secondary | ICD-10-CM | POA: Diagnosis not present

## 2022-06-04 DIAGNOSIS — D509 Iron deficiency anemia, unspecified: Secondary | ICD-10-CM | POA: Diagnosis not present

## 2022-06-06 DIAGNOSIS — E11319 Type 2 diabetes mellitus with unspecified diabetic retinopathy without macular edema: Secondary | ICD-10-CM | POA: Diagnosis not present

## 2022-06-06 DIAGNOSIS — E1165 Type 2 diabetes mellitus with hyperglycemia: Secondary | ICD-10-CM | POA: Diagnosis not present

## 2022-06-06 DIAGNOSIS — E118 Type 2 diabetes mellitus with unspecified complications: Secondary | ICD-10-CM | POA: Diagnosis not present

## 2022-06-06 DIAGNOSIS — J069 Acute upper respiratory infection, unspecified: Secondary | ICD-10-CM | POA: Diagnosis not present

## 2022-06-06 DIAGNOSIS — N184 Chronic kidney disease, stage 4 (severe): Secondary | ICD-10-CM | POA: Diagnosis not present

## 2022-06-06 DIAGNOSIS — I10 Essential (primary) hypertension: Secondary | ICD-10-CM | POA: Diagnosis not present

## 2022-06-08 ENCOUNTER — Encounter: Payer: Self-pay | Admitting: Hematology and Oncology

## 2022-06-08 DIAGNOSIS — Z992 Dependence on renal dialysis: Secondary | ICD-10-CM | POA: Diagnosis not present

## 2022-06-08 DIAGNOSIS — N189 Chronic kidney disease, unspecified: Secondary | ICD-10-CM | POA: Diagnosis not present

## 2022-06-08 DIAGNOSIS — N186 End stage renal disease: Secondary | ICD-10-CM | POA: Diagnosis not present

## 2022-06-08 DIAGNOSIS — D509 Iron deficiency anemia, unspecified: Secondary | ICD-10-CM | POA: Diagnosis not present

## 2022-06-08 DIAGNOSIS — R110 Nausea: Secondary | ICD-10-CM | POA: Diagnosis not present

## 2022-06-08 DIAGNOSIS — N2581 Secondary hyperparathyroidism of renal origin: Secondary | ICD-10-CM | POA: Diagnosis not present

## 2022-06-09 DIAGNOSIS — D509 Iron deficiency anemia, unspecified: Secondary | ICD-10-CM | POA: Diagnosis not present

## 2022-06-09 DIAGNOSIS — N186 End stage renal disease: Secondary | ICD-10-CM | POA: Diagnosis not present

## 2022-06-09 DIAGNOSIS — N189 Chronic kidney disease, unspecified: Secondary | ICD-10-CM | POA: Diagnosis not present

## 2022-06-09 DIAGNOSIS — R110 Nausea: Secondary | ICD-10-CM | POA: Diagnosis not present

## 2022-06-09 DIAGNOSIS — Z992 Dependence on renal dialysis: Secondary | ICD-10-CM | POA: Diagnosis not present

## 2022-06-09 DIAGNOSIS — N2581 Secondary hyperparathyroidism of renal origin: Secondary | ICD-10-CM | POA: Diagnosis not present

## 2022-06-11 DIAGNOSIS — Z992 Dependence on renal dialysis: Secondary | ICD-10-CM | POA: Diagnosis not present

## 2022-06-11 DIAGNOSIS — N2581 Secondary hyperparathyroidism of renal origin: Secondary | ICD-10-CM | POA: Diagnosis not present

## 2022-06-11 DIAGNOSIS — N186 End stage renal disease: Secondary | ICD-10-CM | POA: Diagnosis not present

## 2022-06-11 DIAGNOSIS — D509 Iron deficiency anemia, unspecified: Secondary | ICD-10-CM | POA: Diagnosis not present

## 2022-06-11 DIAGNOSIS — R110 Nausea: Secondary | ICD-10-CM | POA: Diagnosis not present

## 2022-06-11 DIAGNOSIS — N189 Chronic kidney disease, unspecified: Secondary | ICD-10-CM | POA: Diagnosis not present

## 2022-06-13 DIAGNOSIS — Z992 Dependence on renal dialysis: Secondary | ICD-10-CM | POA: Diagnosis not present

## 2022-06-13 DIAGNOSIS — N186 End stage renal disease: Secondary | ICD-10-CM | POA: Diagnosis not present

## 2022-06-13 DIAGNOSIS — R110 Nausea: Secondary | ICD-10-CM | POA: Diagnosis not present

## 2022-06-13 DIAGNOSIS — D509 Iron deficiency anemia, unspecified: Secondary | ICD-10-CM | POA: Diagnosis not present

## 2022-06-13 DIAGNOSIS — N189 Chronic kidney disease, unspecified: Secondary | ICD-10-CM | POA: Diagnosis not present

## 2022-06-13 DIAGNOSIS — N2581 Secondary hyperparathyroidism of renal origin: Secondary | ICD-10-CM | POA: Diagnosis not present

## 2022-06-15 ENCOUNTER — Ambulatory Visit (HOSPITAL_COMMUNITY): Admission: RE | Admit: 2022-06-15 | Payer: Medicare Other | Source: Ambulatory Visit

## 2022-06-16 DIAGNOSIS — R110 Nausea: Secondary | ICD-10-CM | POA: Diagnosis not present

## 2022-06-16 DIAGNOSIS — D509 Iron deficiency anemia, unspecified: Secondary | ICD-10-CM | POA: Diagnosis not present

## 2022-06-16 DIAGNOSIS — N186 End stage renal disease: Secondary | ICD-10-CM | POA: Diagnosis not present

## 2022-06-16 DIAGNOSIS — N189 Chronic kidney disease, unspecified: Secondary | ICD-10-CM | POA: Diagnosis not present

## 2022-06-16 DIAGNOSIS — Z992 Dependence on renal dialysis: Secondary | ICD-10-CM | POA: Diagnosis not present

## 2022-06-16 DIAGNOSIS — N2581 Secondary hyperparathyroidism of renal origin: Secondary | ICD-10-CM | POA: Diagnosis not present

## 2022-06-18 DIAGNOSIS — N189 Chronic kidney disease, unspecified: Secondary | ICD-10-CM | POA: Diagnosis not present

## 2022-06-18 DIAGNOSIS — Z992 Dependence on renal dialysis: Secondary | ICD-10-CM | POA: Diagnosis not present

## 2022-06-18 DIAGNOSIS — R110 Nausea: Secondary | ICD-10-CM | POA: Diagnosis not present

## 2022-06-18 DIAGNOSIS — N186 End stage renal disease: Secondary | ICD-10-CM | POA: Diagnosis not present

## 2022-06-18 DIAGNOSIS — N2581 Secondary hyperparathyroidism of renal origin: Secondary | ICD-10-CM | POA: Diagnosis not present

## 2022-06-18 DIAGNOSIS — D509 Iron deficiency anemia, unspecified: Secondary | ICD-10-CM | POA: Diagnosis not present

## 2022-06-20 DIAGNOSIS — R110 Nausea: Secondary | ICD-10-CM | POA: Diagnosis not present

## 2022-06-20 DIAGNOSIS — N2581 Secondary hyperparathyroidism of renal origin: Secondary | ICD-10-CM | POA: Diagnosis not present

## 2022-06-20 DIAGNOSIS — N186 End stage renal disease: Secondary | ICD-10-CM | POA: Diagnosis not present

## 2022-06-20 DIAGNOSIS — Z992 Dependence on renal dialysis: Secondary | ICD-10-CM | POA: Diagnosis not present

## 2022-06-20 DIAGNOSIS — D509 Iron deficiency anemia, unspecified: Secondary | ICD-10-CM | POA: Diagnosis not present

## 2022-06-20 DIAGNOSIS — N189 Chronic kidney disease, unspecified: Secondary | ICD-10-CM | POA: Diagnosis not present

## 2022-06-24 DIAGNOSIS — E1129 Type 2 diabetes mellitus with other diabetic kidney complication: Secondary | ICD-10-CM | POA: Diagnosis not present

## 2022-06-24 DIAGNOSIS — N186 End stage renal disease: Secondary | ICD-10-CM | POA: Diagnosis not present

## 2022-06-24 DIAGNOSIS — Z992 Dependence on renal dialysis: Secondary | ICD-10-CM | POA: Diagnosis not present

## 2022-06-25 DIAGNOSIS — N186 End stage renal disease: Secondary | ICD-10-CM | POA: Diagnosis not present

## 2022-06-25 DIAGNOSIS — E1122 Type 2 diabetes mellitus with diabetic chronic kidney disease: Secondary | ICD-10-CM | POA: Diagnosis not present

## 2022-06-25 DIAGNOSIS — D631 Anemia in chronic kidney disease: Secondary | ICD-10-CM | POA: Diagnosis not present

## 2022-06-25 DIAGNOSIS — Z992 Dependence on renal dialysis: Secondary | ICD-10-CM | POA: Diagnosis not present

## 2022-06-25 DIAGNOSIS — D509 Iron deficiency anemia, unspecified: Secondary | ICD-10-CM | POA: Diagnosis not present

## 2022-06-25 DIAGNOSIS — N189 Chronic kidney disease, unspecified: Secondary | ICD-10-CM | POA: Diagnosis not present

## 2022-06-25 DIAGNOSIS — N2581 Secondary hyperparathyroidism of renal origin: Secondary | ICD-10-CM | POA: Diagnosis not present

## 2022-06-27 DIAGNOSIS — D631 Anemia in chronic kidney disease: Secondary | ICD-10-CM | POA: Diagnosis not present

## 2022-06-27 DIAGNOSIS — N189 Chronic kidney disease, unspecified: Secondary | ICD-10-CM | POA: Diagnosis not present

## 2022-06-27 DIAGNOSIS — Z992 Dependence on renal dialysis: Secondary | ICD-10-CM | POA: Diagnosis not present

## 2022-06-27 DIAGNOSIS — D509 Iron deficiency anemia, unspecified: Secondary | ICD-10-CM | POA: Diagnosis not present

## 2022-06-27 DIAGNOSIS — N186 End stage renal disease: Secondary | ICD-10-CM | POA: Diagnosis not present

## 2022-06-27 DIAGNOSIS — N2581 Secondary hyperparathyroidism of renal origin: Secondary | ICD-10-CM | POA: Diagnosis not present

## 2022-06-30 DIAGNOSIS — N189 Chronic kidney disease, unspecified: Secondary | ICD-10-CM | POA: Diagnosis not present

## 2022-06-30 DIAGNOSIS — D509 Iron deficiency anemia, unspecified: Secondary | ICD-10-CM | POA: Diagnosis not present

## 2022-06-30 DIAGNOSIS — Z992 Dependence on renal dialysis: Secondary | ICD-10-CM | POA: Diagnosis not present

## 2022-06-30 DIAGNOSIS — N2581 Secondary hyperparathyroidism of renal origin: Secondary | ICD-10-CM | POA: Diagnosis not present

## 2022-06-30 DIAGNOSIS — N186 End stage renal disease: Secondary | ICD-10-CM | POA: Diagnosis not present

## 2022-06-30 DIAGNOSIS — D631 Anemia in chronic kidney disease: Secondary | ICD-10-CM | POA: Diagnosis not present

## 2022-07-01 DIAGNOSIS — E1165 Type 2 diabetes mellitus with hyperglycemia: Secondary | ICD-10-CM | POA: Diagnosis not present

## 2022-07-01 DIAGNOSIS — E559 Vitamin D deficiency, unspecified: Secondary | ICD-10-CM | POA: Diagnosis not present

## 2022-07-01 DIAGNOSIS — I10 Essential (primary) hypertension: Secondary | ICD-10-CM | POA: Diagnosis not present

## 2022-07-01 DIAGNOSIS — E782 Mixed hyperlipidemia: Secondary | ICD-10-CM | POA: Diagnosis not present

## 2022-07-02 DIAGNOSIS — N186 End stage renal disease: Secondary | ICD-10-CM | POA: Diagnosis not present

## 2022-07-02 DIAGNOSIS — D631 Anemia in chronic kidney disease: Secondary | ICD-10-CM | POA: Diagnosis not present

## 2022-07-02 DIAGNOSIS — Z992 Dependence on renal dialysis: Secondary | ICD-10-CM | POA: Diagnosis not present

## 2022-07-02 DIAGNOSIS — N2581 Secondary hyperparathyroidism of renal origin: Secondary | ICD-10-CM | POA: Diagnosis not present

## 2022-07-02 DIAGNOSIS — D509 Iron deficiency anemia, unspecified: Secondary | ICD-10-CM | POA: Diagnosis not present

## 2022-07-02 DIAGNOSIS — N189 Chronic kidney disease, unspecified: Secondary | ICD-10-CM | POA: Diagnosis not present

## 2022-07-04 DIAGNOSIS — D631 Anemia in chronic kidney disease: Secondary | ICD-10-CM | POA: Diagnosis not present

## 2022-07-04 DIAGNOSIS — N186 End stage renal disease: Secondary | ICD-10-CM | POA: Diagnosis not present

## 2022-07-04 DIAGNOSIS — N189 Chronic kidney disease, unspecified: Secondary | ICD-10-CM | POA: Diagnosis not present

## 2022-07-04 DIAGNOSIS — N2581 Secondary hyperparathyroidism of renal origin: Secondary | ICD-10-CM | POA: Diagnosis not present

## 2022-07-04 DIAGNOSIS — D509 Iron deficiency anemia, unspecified: Secondary | ICD-10-CM | POA: Diagnosis not present

## 2022-07-04 DIAGNOSIS — Z992 Dependence on renal dialysis: Secondary | ICD-10-CM | POA: Diagnosis not present

## 2022-07-06 DIAGNOSIS — Z992 Dependence on renal dialysis: Secondary | ICD-10-CM | POA: Diagnosis not present

## 2022-07-06 DIAGNOSIS — N189 Chronic kidney disease, unspecified: Secondary | ICD-10-CM | POA: Diagnosis not present

## 2022-07-06 DIAGNOSIS — N186 End stage renal disease: Secondary | ICD-10-CM | POA: Diagnosis not present

## 2022-07-06 DIAGNOSIS — D631 Anemia in chronic kidney disease: Secondary | ICD-10-CM | POA: Diagnosis not present

## 2022-07-06 DIAGNOSIS — D509 Iron deficiency anemia, unspecified: Secondary | ICD-10-CM | POA: Diagnosis not present

## 2022-07-06 DIAGNOSIS — N2581 Secondary hyperparathyroidism of renal origin: Secondary | ICD-10-CM | POA: Diagnosis not present

## 2022-07-08 DIAGNOSIS — N2581 Secondary hyperparathyroidism of renal origin: Secondary | ICD-10-CM | POA: Diagnosis not present

## 2022-07-08 DIAGNOSIS — D631 Anemia in chronic kidney disease: Secondary | ICD-10-CM | POA: Diagnosis not present

## 2022-07-08 DIAGNOSIS — D509 Iron deficiency anemia, unspecified: Secondary | ICD-10-CM | POA: Diagnosis not present

## 2022-07-08 DIAGNOSIS — Z992 Dependence on renal dialysis: Secondary | ICD-10-CM | POA: Diagnosis not present

## 2022-07-08 DIAGNOSIS — N186 End stage renal disease: Secondary | ICD-10-CM | POA: Diagnosis not present

## 2022-07-08 DIAGNOSIS — E1122 Type 2 diabetes mellitus with diabetic chronic kidney disease: Secondary | ICD-10-CM | POA: Diagnosis not present

## 2022-07-08 DIAGNOSIS — N189 Chronic kidney disease, unspecified: Secondary | ICD-10-CM | POA: Diagnosis not present

## 2022-07-10 DIAGNOSIS — N189 Chronic kidney disease, unspecified: Secondary | ICD-10-CM | POA: Diagnosis not present

## 2022-07-10 DIAGNOSIS — N186 End stage renal disease: Secondary | ICD-10-CM | POA: Diagnosis not present

## 2022-07-10 DIAGNOSIS — Z992 Dependence on renal dialysis: Secondary | ICD-10-CM | POA: Diagnosis not present

## 2022-07-10 DIAGNOSIS — N2581 Secondary hyperparathyroidism of renal origin: Secondary | ICD-10-CM | POA: Diagnosis not present

## 2022-07-10 DIAGNOSIS — D509 Iron deficiency anemia, unspecified: Secondary | ICD-10-CM | POA: Diagnosis not present

## 2022-07-10 DIAGNOSIS — D631 Anemia in chronic kidney disease: Secondary | ICD-10-CM | POA: Diagnosis not present

## 2022-07-11 ENCOUNTER — Observation Stay (HOSPITAL_COMMUNITY)
Admission: EM | Admit: 2022-07-11 | Discharge: 2022-07-14 | Disposition: A | Discharge status: Home Health | Payer: Medicare Other | Attending: Internal Medicine | Admitting: Internal Medicine

## 2022-07-11 ENCOUNTER — Emergency Department (HOSPITAL_COMMUNITY): Payer: Medicare Other

## 2022-07-11 ENCOUNTER — Encounter (HOSPITAL_COMMUNITY): Payer: Self-pay | Admitting: Emergency Medicine

## 2022-07-11 ENCOUNTER — Other Ambulatory Visit: Payer: Self-pay

## 2022-07-11 DIAGNOSIS — D631 Anemia in chronic kidney disease: Secondary | ICD-10-CM | POA: Insufficient documentation

## 2022-07-11 DIAGNOSIS — E1122 Type 2 diabetes mellitus with diabetic chronic kidney disease: Secondary | ICD-10-CM | POA: Diagnosis not present

## 2022-07-11 DIAGNOSIS — N186 End stage renal disease: Secondary | ICD-10-CM | POA: Diagnosis not present

## 2022-07-11 DIAGNOSIS — F0390 Unspecified dementia without behavioral disturbance: Secondary | ICD-10-CM | POA: Insufficient documentation

## 2022-07-11 DIAGNOSIS — R41841 Cognitive communication deficit: Secondary | ICD-10-CM | POA: Diagnosis not present

## 2022-07-11 DIAGNOSIS — Z89412 Acquired absence of left great toe: Secondary | ICD-10-CM | POA: Insufficient documentation

## 2022-07-11 DIAGNOSIS — I6381 Other cerebral infarction due to occlusion or stenosis of small artery: Secondary | ICD-10-CM | POA: Diagnosis not present

## 2022-07-11 DIAGNOSIS — I10 Essential (primary) hypertension: Secondary | ICD-10-CM

## 2022-07-11 DIAGNOSIS — D696 Thrombocytopenia, unspecified: Secondary | ICD-10-CM | POA: Insufficient documentation

## 2022-07-11 DIAGNOSIS — I959 Hypotension, unspecified: Secondary | ICD-10-CM | POA: Diagnosis not present

## 2022-07-11 DIAGNOSIS — E876 Hypokalemia: Secondary | ICD-10-CM | POA: Diagnosis not present

## 2022-07-11 DIAGNOSIS — I120 Hypertensive chronic kidney disease with stage 5 chronic kidney disease or end stage renal disease: Secondary | ICD-10-CM | POA: Insufficient documentation

## 2022-07-11 DIAGNOSIS — R55 Syncope and collapse: Secondary | ICD-10-CM | POA: Diagnosis not present

## 2022-07-11 DIAGNOSIS — R519 Headache, unspecified: Secondary | ICD-10-CM | POA: Diagnosis not present

## 2022-07-11 DIAGNOSIS — G319 Degenerative disease of nervous system, unspecified: Secondary | ICD-10-CM | POA: Diagnosis not present

## 2022-07-11 DIAGNOSIS — Z992 Dependence on renal dialysis: Secondary | ICD-10-CM | POA: Insufficient documentation

## 2022-07-11 DIAGNOSIS — I639 Cerebral infarction, unspecified: Principal | ICD-10-CM | POA: Diagnosis present

## 2022-07-11 DIAGNOSIS — Z79899 Other long term (current) drug therapy: Secondary | ICD-10-CM | POA: Insufficient documentation

## 2022-07-11 DIAGNOSIS — G4489 Other headache syndrome: Secondary | ICD-10-CM | POA: Diagnosis not present

## 2022-07-11 DIAGNOSIS — Z89411 Acquired absence of right great toe: Secondary | ICD-10-CM | POA: Insufficient documentation

## 2022-07-11 DIAGNOSIS — E46 Unspecified protein-calorie malnutrition: Secondary | ICD-10-CM | POA: Diagnosis not present

## 2022-07-11 DIAGNOSIS — R42 Dizziness and giddiness: Secondary | ICD-10-CM | POA: Diagnosis not present

## 2022-07-11 DIAGNOSIS — R0902 Hypoxemia: Secondary | ICD-10-CM | POA: Diagnosis not present

## 2022-07-11 LAB — RAPID URINE DRUG SCREEN, HOSP PERFORMED
Amphetamines: NOT DETECTED
Barbiturates: NOT DETECTED
Benzodiazepines: NOT DETECTED
Cocaine: NOT DETECTED
Opiates: NOT DETECTED
Tetrahydrocannabinol: NOT DETECTED

## 2022-07-11 LAB — DIFFERENTIAL
Abs Immature Granulocytes: 0.01 10*3/uL (ref 0.00–0.07)
Basophils Absolute: 0 10*3/uL (ref 0.0–0.1)
Basophils Relative: 0 %
Eosinophils Absolute: 0.1 10*3/uL (ref 0.0–0.5)
Eosinophils Relative: 3 %
Immature Granulocytes: 0 %
Lymphocytes Relative: 28 %
Lymphs Abs: 1.3 10*3/uL (ref 0.7–4.0)
Monocytes Absolute: 0.4 10*3/uL (ref 0.1–1.0)
Monocytes Relative: 8 %
Neutro Abs: 2.8 10*3/uL (ref 1.7–7.7)
Neutrophils Relative %: 61 %

## 2022-07-11 LAB — COMPREHENSIVE METABOLIC PANEL
ALT: 9 U/L (ref 0–44)
AST: 15 U/L (ref 15–41)
Albumin: 2.8 g/dL — ABNORMAL LOW (ref 3.5–5.0)
Alkaline Phosphatase: 77 U/L (ref 38–126)
Anion gap: 8 (ref 5–15)
BUN: 18 mg/dL (ref 8–23)
CO2: 32 mmol/L (ref 22–32)
Calcium: 8.1 mg/dL — ABNORMAL LOW (ref 8.9–10.3)
Chloride: 96 mmol/L — ABNORMAL LOW (ref 98–111)
Creatinine, Ser: 3.16 mg/dL — ABNORMAL HIGH (ref 0.44–1.00)
GFR, Estimated: 15 mL/min — ABNORMAL LOW (ref >60)
Glucose, Bld: 176 mg/dL — ABNORMAL HIGH (ref 70–99)
Potassium: 3.3 mmol/L — ABNORMAL LOW (ref 3.5–5.1)
Sodium: 136 mmol/L (ref 135–145)
Total Bilirubin: 0.6 mg/dL (ref 0.3–1.2)
Total Protein: 5.4 g/dL — ABNORMAL LOW (ref 6.5–8.1)

## 2022-07-11 LAB — IRON: Iron: 58 ug/dL (ref 28–170)

## 2022-07-11 LAB — CBC
HCT: 28.7 % — ABNORMAL LOW (ref 36.0–46.0)
Hemoglobin: 8.8 g/dL — ABNORMAL LOW (ref 12.0–15.0)
MCH: 30.8 pg (ref 26.0–34.0)
MCHC: 30.7 g/dL (ref 30.0–36.0)
MCV: 100.3 fL — ABNORMAL HIGH (ref 80.0–100.0)
Platelets: 73 10*3/uL — ABNORMAL LOW (ref 150–400)
RBC: 2.86 MIL/uL — ABNORMAL LOW (ref 3.87–5.11)
RDW: 16.2 % — ABNORMAL HIGH (ref 11.5–15.5)
WBC: 4.7 10*3/uL (ref 4.0–10.5)
nRBC: 0 % (ref 0.0–0.2)

## 2022-07-11 LAB — I-STAT CHEM 8, ED
BUN: 21 mg/dL (ref 8–23)
Calcium, Ion: 1.05 mmol/L — ABNORMAL LOW (ref 1.15–1.40)
Chloride: 95 mmol/L — ABNORMAL LOW (ref 98–111)
Creatinine, Ser: 3.3 mg/dL — ABNORMAL HIGH (ref 0.44–1.00)
Glucose, Bld: 169 mg/dL — ABNORMAL HIGH (ref 70–99)
HCT: 28 % — ABNORMAL LOW (ref 36.0–46.0)
Hemoglobin: 9.5 g/dL — ABNORMAL LOW (ref 12.0–15.0)
Potassium: 3.5 mmol/L (ref 3.5–5.1)
Sodium: 139 mmol/L (ref 135–145)
TCO2: 34 mmol/L — ABNORMAL HIGH (ref 22–32)

## 2022-07-11 LAB — MAGNESIUM: Magnesium: 2.2 mg/dL (ref 1.7–2.4)

## 2022-07-11 LAB — URINALYSIS, ROUTINE W REFLEX MICROSCOPIC
Bacteria, UA: NONE SEEN
Bilirubin Urine: NEGATIVE
Glucose, UA: 50 mg/dL — AB
Hgb urine dipstick: NEGATIVE
Ketones, ur: NEGATIVE mg/dL
Nitrite: NEGATIVE
Protein, ur: >=300 mg/dL — AB
Specific Gravity, Urine: 1.008 (ref 1.005–1.030)
WBC, UA: >50 WBC/hpf (ref 0–5)
pH: 8 (ref 5.0–8.0)

## 2022-07-11 LAB — PROTIME-INR
INR: 1.2 (ref 0.8–1.2)
Prothrombin Time: 15.9 seconds — ABNORMAL HIGH (ref 11.4–15.2)

## 2022-07-11 LAB — ETHANOL: Alcohol, Ethyl (B): <10 mg/dL (ref <10)

## 2022-07-11 LAB — APTT: aPTT: 32 seconds (ref 24–36)

## 2022-07-11 LAB — HEMOGLOBIN A1C
Hgb A1c MFr Bld: 4.8 % (ref 4.8–5.6)
Mean Plasma Glucose: 91.06 mg/dL

## 2022-07-11 LAB — GLUCOSE, CAPILLARY: Glucose-Capillary: 182 mg/dL — ABNORMAL HIGH (ref 70–99)

## 2022-07-11 LAB — CBG MONITORING, ED: Glucose-Capillary: 188 mg/dL — ABNORMAL HIGH (ref 70–99)

## 2022-07-11 MED ORDER — ENOXAPARIN SODIUM 30 MG/0.3ML IJ SOSY: 30.0000 mg | PREFILLED_SYRINGE | INTRAMUSCULAR | Status: DC

## 2022-07-11 MED ORDER — ACETAMINOPHEN 160 MG/5ML PO SOLN: 650.0000 mg | ORAL | Status: DC | PRN

## 2022-07-11 MED ORDER — ACETAMINOPHEN 325 MG PO TABS
650.0000 mg | ORAL_TABLET | ORAL | Status: DC | PRN
  Administered 2022-07-11: 650 mg via ORAL
  Filled 2022-07-11: qty 2

## 2022-07-11 MED ORDER — INSULIN ASPART 100 UNIT/ML IJ SOLN
0.0000 [IU] | Freq: Three times a day (TID) | INTRAMUSCULAR | Status: DC
  Administered 2022-07-13 – 2022-07-14 (×3): 1 [IU] via SUBCUTANEOUS

## 2022-07-11 MED ORDER — ACETAMINOPHEN 650 MG RE SUPP: 650.0000 mg | RECTAL | Status: DC | PRN

## 2022-07-11 MED ORDER — LORAZEPAM 1 MG PO TABS: 0.5000 mg | ORAL_TABLET | ORAL | Status: DC | PRN

## 2022-07-11 MED ORDER — ASPIRIN 300 MG RE SUPP: 300.0000 mg | Freq: Once | RECTAL | Status: AC

## 2022-07-11 MED ORDER — STROKE: EARLY STAGES OF RECOVERY BOOK: Freq: Once | Status: AC

## 2022-07-11 MED ORDER — CLOPIDOGREL BISULFATE 75 MG PO TABS
75.0000 mg | ORAL_TABLET | Freq: Every day | ORAL | Status: DC
  Administered 2022-07-12 – 2022-07-14 (×3): 75 mg via ORAL
  Filled 2022-07-11 (×3): qty 1

## 2022-07-11 MED ORDER — ASPIRIN 81 MG PO TBEC
81.0000 mg | DELAYED_RELEASE_TABLET | Freq: Every day | ORAL | Status: DC
  Administered 2022-07-12 – 2022-07-14 (×3): 81 mg via ORAL
  Filled 2022-07-11 (×3): qty 1

## 2022-07-11 MED ORDER — ASPIRIN 325 MG PO TABS
325.0000 mg | ORAL_TABLET | Freq: Once | ORAL | Status: AC
  Administered 2022-07-11: 325 mg via ORAL
  Filled 2022-07-11: qty 1

## 2022-07-11 MED ORDER — CLOPIDOGREL BISULFATE 300 MG PO TABS
300.0000 mg | ORAL_TABLET | Freq: Once | ORAL | Status: AC
  Administered 2022-07-11: 300 mg via ORAL
  Filled 2022-07-11: qty 1

## 2022-07-11 NOTE — ED Provider Notes (Signed)
  [place] EMERGENCY DEPARTMENT AT [facility] Provider Note   CSN: [id number] Arrival date & time: 07/11/22  1041     History {Add pertinent medical, surgical, social history, OB history to HPI:1} Chief Complaint  Patient presents with   Altered Mental Status    Bailey [name] is a 72 y.o. female.   Altered Mental Status      Home Medications Prior to Admission medications   Medication Sig Start Date End Date Taking? Authorizing Provider  acetaminophen (TYLENOL) 500 MG tablet Take 1,000 mg by mouth every 6 (six) hours as needed for fever or headache (pain).     [provider]  amLODipine (NORVASC) 5 MG tablet Take 1/2 tablet (2.5 mg total) by mouth at bedtime. 04/30/21   Ghimire, Werner Lean, MD  atorvastatin (LIPITOR) 40 MG tablet Take 1 tablet (40 mg total) by mouth daily. 12/23/21   Ocie Doyne, MD  AURYXIA 1 GM 210 MG(Fe) tablet Take 210 mg by mouth 3 (three) times daily. 02/06/21   [provider]  carvedilol (COREG) 3.125 MG tablet Take 1 tablet (3.125 mg total) by mouth 2 (two) times daily with a meal. Patient taking differently: Take 6.25 mg by mouth 2 (two) times daily with a meal. 04/30/21   Ghimire, Werner Lean, MD  donepezil (ARICEPT) 5 MG tablet Take 1/2 pill daily for 4 weeks, then increase to 1 pill daily 12/22/21   Ocie Doyne, MD  hydrALAZINE (APRESOLINE) 25 MG tablet TAKE 1 TABLET(25 MG) BY MOUTH THREE TIMES DAILY 12/11/21   Tolia, Sunit, DO  isosorbide dinitrate (ISORDIL) 20 MG tablet TAKE 1 TABLET(20 MG) BY MOUTH THREE TIMES DAILY 12/11/21   Tolia, Sunit, DO  lidocaine (LIDODERM) 5 % Place 1 patch onto the skin daily. Remove & Discard patch within 12 hours or as directed by MD 05/01/21   Maretta Bees, MD      Allergies    Patient has no known allergies.    Review of Systems   Review of Systems  Physical Exam Updated Vital Signs LMP  (LMP Unknown)  Physical Exam  ED Results / Procedures / Treatments   Labs (all  labs ordered are listed, but only abnormal results are displayed) Labs Reviewed - No data to display  EKG None  Radiology No results found.  Procedures Procedures  {Document cardiac monitor, telemetry assessment procedure when appropriate:1}  Medications Ordered in ED Medications - No data to display  ED Course/ Medical Decision Making/ A&P   {   Click here for ABCD2, HEART and other calculatorsREFRESH Note before signing :1}                          Medical Decision Making  ***  {Document critical care time when appropriate:1} {Document review of labs and clinical decision tools ie heart score, Chads2Vasc2 etc:1}  {Document your independent review of radiology images, and any outside records:1} {Document your discussion with family members, caretakers, and with consultants:1} {Document social determinants of health affecting pt's care:1} {Document your decision making why or why not admission, treatments were needed:1} Final Clinical Impression(s) / ED Diagnoses Final diagnoses:  None    Rx / DC Orders ED Discharge Orders     None

## 2022-07-11 NOTE — Hospital Course (Addendum)
MWF HD- getting weaker over last yr w HD  When she got up her legs felt like lead.  Her daughter-in-law provided all of history. She complained of headache this morning 3/10 then started moaning and pain had worsened to 8/10. Her son and daughter-inlaw tried stroke test and called ems.  Low BP with EMS  She and husband moved out of home for a month and now they've moved back in. Her son is providing day time care with improvement in strength.  She frequently feels dizzy or that she is going to pass out.  Home meds Amlodipine 5mg  Hydral 25 mg at lunch Coreg 3.125 BID Isordil 20 mg qd  Social: Has both walker and cane but does not use inside house.  Never smoked No etoh use  5-20 Patient feeling well this morning. She ate a big breakfast. Nutrition has not seen patient yet. Left arm still feels a little weak and uncoordinated. Patient lives with her husband who is retired, but son comes by frequently. Patient thought it was January 2022. She could recall two of three words with hints. Discussed plan for today and answered all questions.

## 2022-07-11 NOTE — Consult Note (Signed)
NEURO HOSPITALIST CONSULT NOTE   Requestig physician: Dr. Sol Blazing  Reason for Consult: Punctate acute infarction on MRI  History obtained from:  Patient, Son and Chart     HPI:                                                                                                                                          Bailey [name] is an 72 y.o. female with a PMHx of anemia, ESRD on HD, DM2, diabetic retinopathy, HTN, lacunar infarction, short-term memory loss, vitamin D and B12 deficiencies who presented to the hospital on Saturday morning with a c/c of new onset headache, dizziness and weakness. She was hypotensive with minor improvement in BP after a fluid bolus. There was some question from EMS regarding possible extremity weakness and facial droop, which had resolved after arriving to the ED. She states that her dizziness was a feeling of lightheadedness, not vertigo.   MRI brain revealed a punctate left caudate ischemic infarction. Neurology was consulted for stroke work up and management.   Past Medical History:  Diagnosis Date   Anemia    patient preference - stopped iron    ARF (acute renal failure) (HCC) 04/2016   dehydration   Chronic kidney disease    stage 5   DDD (degenerative disc disease), lumbar    Depression    Diabetes mellitus    Type II - pt preference - stopped lantus   Diabetic retinopathy (HCC)    Diverticulitis    History of kidney stones    passed- 6   Hypertension    Lacunar infarction (HCC)    Osteomyelitis (HCC)    Polyneuropathy    Vitamin B 12 deficiency 06/09/2017   Vitamin D deficiency    Wears partial dentures    upper    Past Surgical History:  Procedure Laterality Date   AMPUTATION Right 08/14/2016   Procedure: RIGHT 1ST RAY AMPUTATION MID-SHAFT;  Surgeon: Nadara Mustard, MD;  Location: [facility] OR;  Service: Orthopedics;  Laterality: Right;   BASCILIC VEIN TRANSPOSITION Left 06/08/2019   Procedure: LEFT BRACHIOCEPHALIC VEIN  CREATION;  Surgeon: [name], MD;  Location: [facility] OR;  Service: Vascular;  Laterality: Left;   CATARACT EXTRACTION  2018   INCISION AND DRAINAGE ABSCESS Left 02/11/2014   Procedure: INCISION AND DRAINAGE ABSCESS Left Buttock;  Surgeon: Avel Peace, MD;  Location: [facility] ORS;  Service: General;  Laterality: Left;   INSERTION OF DIALYSIS CATHETER Left 09/07/2019   Procedure: INSERTION OF LEFT INTERNAL JUGULAR DIALYSIS CATHETER;  Surgeon: Larina Earthly, MD;  Location: [facility] OR;  Service: Vascular;  Laterality: Left;   IR REMOVAL TUN CV CATH W/O FL  09/04/2019   LAPAROSCOPIC SMALL BOWEL RESECTION N/A 05/09/2016   Procedure: LAPAROSCOPIC SMALL BOWEL  RESECTION;  Surgeon: Karie Soda, MD;  Location: [facility] ORS;  Service: General;  Laterality: N/A;   LAPAROSCOPY N/A 05/09/2016   Procedure: LAPAROSCOPY DIAGNOSTIC, LYSIS OF ADHESIONS, SMALL BOWEL RESECTION X 2;  Surgeon: Karie Soda, MD;  Location: [facility]S;  Service: General;  Laterality: N/A;   TOE AMPUTATION     left foot great toe    Family History  Problem Relation Age of Onset   Diabetes Mother    Asthma Sister             Social History:  reports that she has never smoked. She has never used smokeless tobacco. She reports that she does not drink alcohol and does not use drugs.  No Known Allergies  MEDICATIONS:                                                                                                                     Prior to Admission:  Medications Prior to Admission  Medication Sig Dispense Refill Last Dose   amLODipine (NORVASC) 5 MG tablet Take 1/2 tablet (2.5 mg total) by mouth at bedtime. 30 tablet 3 07/11/2022   atorvastatin (LIPITOR) 40 MG tablet Take 1 tablet (40 mg total) by mouth daily. 30 tablet 6 07/11/2022   AURYXIA 1 GM 210 MG(Fe) tablet Take 210 mg by mouth 3 (three) times daily.   07/11/2022   carvedilol (COREG) 25 MG tablet Take 25 mg by mouth 2 (two) times daily with a meal.   07/11/2022 at 09:00   donepezil  (ARICEPT) 5 MG tablet Take 1/2 pill daily for 4 weeks, then increase to 1 pill daily 90 tablet 4 07/11/2022   hydrALAZINE (APRESOLINE) 25 MG tablet TAKE 1 TABLET(25 MG) BY MOUTH THREE TIMES DAILY (Patient taking differently: Take 25 mg by mouth 3 (three) times daily.) 270 tablet 0 07/10/2022   isosorbide dinitrate (ISORDIL) 20 MG tablet TAKE 1 TABLET(20 MG) BY MOUTH THREE TIMES DAILY (Patient taking differently: Take 20 mg by mouth 3 (three) times daily. Patient has only been getting once daily.) 270 tablet 0 07/11/2022 at 09:00   acetaminophen (TYLENOL) 500 MG tablet Take 1,000 mg by mouth every 6 (six) hours as needed for fever or headache (pain).    unknown   multivitamin (RENA-VIT) TABS tablet Take 1 tablet by mouth daily.   unknown   Scheduled:   stroke: early stages of recovery book   Does not apply Once   aspirin EC  81 mg Oral Daily   clopidogrel  75 mg Oral Daily   insulin aspart  0-6 Units Subcutaneous TID WC   Continuous:   ROS:  As per HPI.    Blood pressure (!) 164/62, pulse 71, temperature 98 F (36.7 C), resp. rate 16, height 5\' 4"  (1.626 m), weight 50 kg, SpO2 96 %.   General Examination:                                                                                                       Physical Exam  HEENT-  [place]/AT    Lungs- Respirations unlabored Extremities- No edema. Status post toe amputations bilaterally.   Neurological Examination Mental Status: Awake and alert. Pleasant and cooperative. Speech is fluent with intact naming for all basic items. Oriented to situation, city and state but not the month, day or year. No dysarthria. Comprehension intact. Cranial Nerves: II: Temporal visual fields intact with no extinction to DSS. PERRL  III,IV, VI: No ptosis. EOMI with saccadic pursuits noted.  V: Temp sensation equal bilaterally  VII:  Smile symmetric VIII: Hearing intact to voice IX,X: No hoarseness XI: Symmetric shoulder shrug XII: Midline tongue extension Motor: BUE 5/5 proximally and distally BLE 5/5 proximally and distally  No pronator drift.  Sensory: Temp and light touch intact throughout, bilaterally. No extinction to DSS.  Deep Tendon Reflexes: 1+ and symmetric throughout Cerebellar: No ataxia with FNF on the right. Prominent ataxia with left FNF. Mild ataxia with RAM bilaterally. No definite ataxia with H-S bilaterally  Gait: Deferred   Lab Results: Basic Metabolic Panel: Recent Labs  Lab 07/11/22 1106 07/11/22 1114  NA 136 139  K 3.3* 3.5  CL 96* 95*  CO2 32  --   GLUCOSE 176* 169*  BUN 18 21  CREATININE 3.16* 3.30*  CALCIUM 8.1*  --     CBC: Recent Labs  Lab 07/11/22 1106 07/11/22 1114  WBC 4.7  --   NEUTROABS 2.8  --   HGB 8.8* 9.5*  HCT 28.7* 28.0*  MCV 100.3*  --   PLT 73*  --     Cardiac Enzymes: No results for input(s): "CKTOTAL", "CKMB", "CKMBINDEX", "TROPONINI" in the last 168 hours.  Lipid Panel: No results for input(s): "CHOL", "TRIG", "HDL", "CHOLHDL", "VLDL", "LDLCALC" in the last 168 hours.  Imaging: MR BRAIN WO CONTRAST  Result Date: 07/11/2022 CLINICAL DATA:  Neuro deficit, acute, stroke suspected. Sudden severe headache. EXAM: MRI HEAD WITHOUT CONTRAST TECHNIQUE: Multiplanar, multiecho pulse sequences of the brain and surrounding structures were obtained without intravenous contrast. COMPARISON:  MR head without contrast scratched at CT head without contrast 07/11/2022. MR head without contrast 02/08/2022 FINDINGS: Brain: Diffusion trace signal hyperintensities are present in the corona radiata bilaterally. These demonstrate T2 shine through on the [facility] maps. Acute punctate infarct is present in the left caudate head. No other acute infarcts are present. Confluent T2 and subcortical FLAIR hyperintensities are moderately advanced for age. Moderate atrophy is also advanced  for age. The ventricles are proportionate to the degree of atrophy. No significant extraaxial fluid collection is present. White matter changes extending into the brainstem are stable. Cerebellum is within normal limits. Midline structures are within normal limits. Vascular: Flow is present in the major  intracranial arteries. Skull and upper cervical spine: The craniocervical junction is normal. Upper cervical spine is within normal limits. T1 marrow signal is somewhat depressed throughout, likely corresponding to the knee a. No focal lesions are present. Sinuses/Orbits: The paranasal sinuses and mastoid air cells are clear. Bilateral lens replacements are noted. Globes and orbits are otherwise unremarkable. IMPRESSION: 1. Acute punctate infarct of the left caudate head. 2. Diffusion trace signal hyperintensities in the corona radiata bilaterally likely reflect evolving subacute infarcts. 3. Moderate atrophy and white matter disease is moderately advanced for age. This likely reflects the sequela of chronic microvascular ischemia. Electronically Signed   By: Marin Roberts M.D.   On: 07/11/2022 16:24   CT HEAD WO CONTRAST  Result Date: 07/11/2022 CLINICAL DATA:  Headache, sudden, severe EXAM: CT HEAD WITHOUT CONTRAST TECHNIQUE: Contiguous axial images were obtained from the base of the skull through the vertex without intravenous contrast. RADIATION DOSE REDUCTION: This exam was performed according to the departmental dose-optimization program which includes automated exposure control, adjustment of the mA and/or kV according to patient size and/or use of iterative reconstruction technique. COMPARISON:  07/27/2021 FINDINGS: Brain: No evidence of acute infarction, hemorrhage, hydrocephalus, extra-axial collection or mass lesion/mass effect. Chronic right basal ganglia lacunar infarct. Patchy low-density changes within the periventricular and subcortical white matter most compatible with chronic  microvascular ischemic change. Mild-moderate diffuse cerebral volume loss. Vascular: Atherosclerotic calcifications involving the large vessels of the skull base. No unexpected hyperdense vessel. Skull: Normal. Negative for fracture or focal lesion. Sinuses/Orbits: No acute finding. Other: None. IMPRESSION: 1. No acute intracranial findings. 2. Chronic microvascular ischemic change and cerebral volume loss. Electronically Signed   By: Duanne Guess D.O.   On: 07/11/2022 11:50     Assessment: 72 year old female with ESRD presenting with new onset headache, dizziness and weakness. There was some question from EMS regarding possible extremity weakness and facial droop, MRI brain revealed a punctate acute left caudate head ischemic infarction.  - Exam reveals prominent LUE ataxia. No motor weakness, facial droop, aphasia or sensory loss noted.  - MRI brain: Acute punctate infarct of the left caudate head. Diffusion trace signal hyperintensities in the corona radiata bilaterally likely reflect evolving subacute infarcts. Atrophy and white matter disease (chronic microvascular ischemia).are moderately advanced for age.  - Possible etiology for acute and subacute ischemic infarctions include cardioembolic given that the subacute corona radiata infarcts are bilateral. If the trace signal hyperintensities are due to artifact, then the left caudate infarct may be also be due to atherothromobotic mechanism.  - Will not be able to use contrast-enhancing imaging studies due to her ESRD  Recommendations: - Agree with starting ASA and Plavix. Continue DAPT for 21 days, then discontinue Plavix and continue ASA indefinitely.  - HgbA1c, fasting lipid panel - MRA of head without contrast - Carotid ultrasound - PT consult, OT consult, Speech consult - TTE - BP management per standard protocol. Out of the permissive HTN time window.  - Telemetry monitoring - Frequent neuro checks - NPO until passes stroke swallow  screen   Electronically signed: Dr. Caryl Pina 07/11/2022, 7:44 PM

## 2022-07-11 NOTE — ED Triage Notes (Signed)
Pt to ER via EMS from home with c/o onset headache, dizziness and weakness.  Noted hypotension with EMS, bolus 500cc fluid with minor improvement. Per family pt is her normal mental state which is short term memory loss.  NIH negative for EMS and at this time.  Family state pt accidentally took double dose of Ferric Sulfate 210mg  (took 6 instead of 3 tabs). Dr. Karene Fry to bedside for assessment.

## 2022-07-11 NOTE — H&P (Cosign Needed Addendum)
Date: 07/11/2022               Patient Name:  Bailey [name] MRN: [id number]  DOB: Jan 28, 1951 Age / Sex: 72 y.o., female   PCP: Lewis Moccasin, MD         Medical Service: Internal Medicine Teaching Service         Attending Physician: Dr. Dickie La, MD    First Contact: Neldon Labella, MD      Pager: Lindley Magnus [phone]      Second Contact: Rudene Christians, DO      Pager: Milinda Pointer [phone]           After Hours (After 5p/  First Contact Pager: [phone]  weekends / holidays): Second Contact Pager: [phone]   SUBJECTIVE   Chief Complaint: Dizziness, headache, weakness  History of Present Illness:  72 year old female with past medical history T2DM, thrombocytopenia, ESRD on MWF HD, malnutrition, HTN, reported prior stroke, presenting with acute onset of dizziness, headache, and weakness this morning.  Patient has poor memory of the events of the morning, much of the history was collected from her daughter-in-law who is present with her in the room.  Patient received dialysis yesterday and felt at her baseline, though over the past few months she has been feeling weaker after dialysis.  She woke up this morning and had breakfast and reported a headache, per her daughter-in-law.  The pain was initially 3/10 and then the patient started moaning and the pain had worsened to 8/10.  They gave her some Tylenol, but this did not work and then the patient started feeling dizzy and weak.  The patient's son and daughter-in-law performed a stroke test and called EMS.  It seems like the patient passed the for stroke test, but then failed 1 out of 3 after EMS arrived because of significant extremity weakness and facial droop.  Blood pressure at that time was also significantly elevated with SBP in the 180s.  Patient was brought to the ED. patient denies any chest pain, dyspnea, palpitations, dysarthria, or specific sudden onset weakness or numbness or any of her extremities.  Her daughter-in-law states  that she was significantly weaker when she came into the ED this morning, and has gotten Stronger over the past few hours.  She is confused and not quite at her cognitive baseline.  She has had short-term memory issues for a long time, but seems like it slightly worse now compared to baseline.  Additionally, it seems like she accidentally took 6 tablets of Auryxia today compared to 3 which she is normally prescribed.  Please control was contacted in the ED.  They recommended checking iron levels.  No other acute interventions necessary.  Of note, her son is her healthcare power of attorney.  She does not have any advanced directives.  She wants to be full code at this time, but might discuss with her son and reconsider in the future.  ED Course: MRI showed acute left caudate punctate stroke.  Neuro was consulted.  She passed bedside swallow.  Meds:  Current Meds  Medication Sig   amLODipine (NORVASC) 5 MG tablet Take 1/2 tablet (2.5 mg total) by mouth at bedtime.   atorvastatin (LIPITOR) 40 MG tablet Take 1 tablet (40 mg total) by mouth daily.   AURYXIA 1 GM 210 MG(Fe) tablet Take 210 mg by mouth 3 (three) times daily.   carvedilol (COREG) 25 MG tablet Take 25 mg by mouth 2 (two) times daily with a meal.  donepezil (ARICEPT) 5 MG tablet Take 1/2 pill daily for 4 weeks, then increase to 1 pill daily   hydrALAZINE (APRESOLINE) 25 MG tablet TAKE 1 TABLET(25 MG) BY MOUTH THREE TIMES DAILY (Patient taking differently: Take 25 mg by mouth 3 (three) times daily.)   isosorbide dinitrate (ISORDIL) 20 MG tablet TAKE 1 TABLET(20 MG) BY MOUTH THREE TIMES DAILY (Patient taking differently: Take 20 mg by mouth 3 (three) times daily. Patient has only been getting once daily.)    Past Medical History  Past Surgical History:  Procedure Laterality Date   AMPUTATION Right 08/14/2016   Procedure: RIGHT 1ST RAY AMPUTATION MID-SHAFT;  Surgeon: Nadara Mustard, MD;  Location: [facility] OR;  Service: Orthopedics;   Laterality: Right;   BASCILIC VEIN TRANSPOSITION Left 06/08/2019   Procedure: LEFT BRACHIOCEPHALIC VEIN CREATION;  Surgeon: [name], MD;  Location: [facility] OR;  Service: Vascular;  Laterality: Left;   CATARACT EXTRACTION  2018   INCISION AND DRAINAGE ABSCESS Left 02/11/2014   Procedure: INCISION AND DRAINAGE ABSCESS Left Buttock;  Surgeon: Avel Peace, MD;  Location: [facility] ORS;  Service: General;  Laterality: Left;   INSERTION OF DIALYSIS CATHETER Left 09/07/2019   Procedure: INSERTION OF LEFT INTERNAL JUGULAR DIALYSIS CATHETER;  Surgeon: Larina Earthly, MD;  Location: [facility] OR;  Service: Vascular;  Laterality: Left;   IR REMOVAL TUN CV CATH W/O FL  09/04/2019   LAPAROSCOPIC SMALL BOWEL RESECTION N/A 05/09/2016   Procedure: LAPAROSCOPIC SMALL BOWEL RESECTION;  Surgeon: Karie Soda, MD;  Location: [facility] ORS;  Service: General;  Laterality: N/A;   LAPAROSCOPY N/A 05/09/2016   Procedure: LAPAROSCOPY DIAGNOSTIC, LYSIS OF ADHESIONS, SMALL BOWEL RESECTION X 2;  Surgeon: Karie Soda, MD;  Location: [facility];  Service: General;  Laterality: N/A;   TOE AMPUTATION     left foot great toe    Social:  Lives With: Husband Occupation: Support: Son and daughter-in-law help with support.  Son is full-time caregiver. Level of Function: Ambulatory with walker, has had falls when she does not use the walker.  Requires assistance with some IADLs as well. PCP: Dr. Duanne Guess Substances: No smoking history, no alcohol use history, no other substances  Family History:  Family History  Problem Relation Age of Onset   Diabetes Mother    Asthma Sister      Allergies: Allergies as of 07/11/2022   (No Known Allergies)    Review of Systems: A complete ROS was negative except as per HPI.   OBJECTIVE:   Physical Exam: Blood pressure (!) 164/62, pulse 71, temperature 98 F (36.7 C), resp. rate 16, height 5\' 4"  (1.626 m), weight 50 kg, SpO2 96 %.  Constitutional: NAD Cardiovascular: RRR, loud systolic  murmur right upper sternal border Pulmonary/Chest: normal work of breathing on room air Abdominal: soft, non-tender, non-distended Extremities: warm, well perfused, extremity pulses 2+, no BLE pitting edema.  Left upper extremity fistula with palpable thrill Neuro: Alert and oriented x 2 (to self and location), EOMI, PERRL, cranial nerves grossly intact. 3/5 strength right elbow flexion and extension, right grip.  3/5 strength left elbow extension.  4/5 otherwise.  Finger-nose testing intact.  Labs:    Latest Ref Rng & Units 07/11/2022   11:14 AM 07/11/2022   11:06 AM 05/10/2021    2:15 AM 04/29/2021    1:37 AM 04/28/2021    2:01 AM 04/26/2021   11:56 PM 04/23/2021    2:21 AM  CBC EXTENDED  WBC 4.0 - 10.5 K/uL  4.7  8.4  4.9  4.9  4.4  6.0   RBC 3.87 - 5.11 MIL/uL  2.86  3.04  2.60  2.74  3.22  3.29   Hemoglobin 12.0 - 15.0 g/dL 9.5  8.8  9.5  8.2  8.7  10.3  10.4   HCT 36.0 - 46.0 % 28.0  28.7  30.0  25.4  26.8  31.4  32.0   Platelets 150 - 400 K/uL  73  162  94  96  115  124   NEUT# 1.7 - 7.7 K/uL  2.8  4.9  2.4   2.4  3.2   Lymph# 0.7 - 4.0 K/uL  1.3  2.5  1.8   1.3  2.1       Latest Ref Rng & Units 07/11/2022   11:14 AM 07/11/2022   11:06 AM 05/10/2021    2:15 AM 04/29/2021    1:37 AM 04/28/2021    2:01 AM 04/26/2021   11:56 PM 04/23/2021    2:21 AM  CMP  Glucose 70 - 99 mg/dL 161  096  99  045  90  125  118   BUN 8 - 23 mg/dL 21  18  16  29  22  16   49   Creatinine 0.44 - 1.00 mg/dL 4.09  8.11  9.14  7.82  4.39  3.26  6.31   Sodium 135 - 145 mmol/L 139  136  137  132  134  136  137   Potassium 3.5 - 5.1 mmol/L 3.5  3.3  3.7  4.3  4.2  3.6  4.0   Chloride 98 - 111 mmol/L 95  96  97  89  92  91  96   CO2 22 - 32 mmol/L  32  30  30  32  33  30   Calcium 8.9 - 10.3 mg/dL  8.1  9.0  8.6  8.6  8.6  9.0   Total Protein 6.5 - 8.1 g/dL  5.4  5.9    6.1    Total Bilirubin 0.3 - 1.2 mg/dL  0.6  0.4    0.8    Alkaline Phos 38 - 126 U/L  77  49    49    AST 15 - 41 U/L  15  13    17     ALT 0 - 44  U/L  9  9    9       Imaging: CT head: IMPRESSION: 1. No acute intracranial findings. 2. Chronic microvascular ischemic change and cerebral volume loss  MRI brain: IMPRESSION: 1. Acute punctate infarct of the left caudate head. 2. Diffusion trace signal hyperintensities in the corona radiata bilaterally likely reflect evolving subacute infarcts. 3. Moderate atrophy and white matter disease is moderately advanced for age. This likely reflects the sequela of chronic microvascular ischemia.  EKG: personally reviewed my interpretation is sinus bradycardia, borderline QTc, probable LVH.  ASSESSMENT & PLAN:  PEREGRINE DENIS is a 72 y.o. female with PMH T2DM, thrombocytopenia, ESRD on MWF HD, malnutrition, HTN, reported prior stroke who presented with dizziness, headache, weakness and admitted for acute punctate left caudate infarct on hospital day 0  #acute punctate left caudate infarct Patient presents with acute onset dizziness, headache, weakness which has since resolved.  In the ED, she was found to be hypertensive and MRI revealed acute punctate infarct in the left caudate head as well as subacute infarcts in the bilateral corona radiata reflecting what are likely hypertensive strokes.  She has  some neurological weakness on exam as well as memory loss, but unsure how much of this is acute.  Of note,, she is prescribed donepezil.  I believe she has been undergoing outpatient vascular dementia workup, but unsure if this has been formally diagnosed.  Weakness might also be secondary to ESRD and chronic malnutrition.  Focus will be on secondary prevention.  Neurology has been consulted, appreciate their recommendations - Initiate DAPT, low today and then aspirin 81, Plavix 75 for 3 weeks and then aspirin monotherapy - Hypertension control as below -Follow-up with LDL.  She is on Lipitor 40 at home. - Follow-up echo - Cardiac monitoring - Follow-up A1c - Appreciate further neurology recs -  PT/OT  # ESRD on MWF HD #Hypokalemia Consult nephrology for routine HD.  Daily renal function panels.  Of note, she took double her dose of Coreg CR today.  Was controllers contacted.  Free iron level 58.  Do not think any further follow-up is necessary.  #HTN Home regimen includes amlodipine 5, Coreg 25 twice daily, hydralazine 25 (only takes in the middle of the day), and Isordil 20 (prescribed 3 times daily but patient is only been taking once daily - Permissive hypertension for 48 hours followed by gradual reduction blood pressures to normotension  # Hx T2DM Follow-up A1c.  Sensitive SSI.  #HLD On Lipitor 40 at home.  Follow-up LDL.  #Malnutrition - Consult nutrition for management  #Thrombocytopenia Has history of thrombocytopenia of unknown etiology.  No active bleeding.  Need to watch in setting of initiating DAPT - Trend CBC, follow peripheral smear  Diet: Renal VTE: SCDs with current thrombocytopenia IVF: None,None Code: Full  Prior to Admission Living Arrangement: Home, living with husband, family support Anticipated Discharge Location:  Pending PT/OT evaluation Barriers to Discharge: Stroke workup  Dispo: Admit patient to Observation with expected length of stay less than 2 midnights.  Signed: Lyndle Herrlich, MD Internal Medicine Resident PGY-1  07/11/2022, 6:45 PM

## 2022-07-11 NOTE — ED Provider Notes (Addendum)
Patient with seen by Dr. Karene Fry.  Please see his note.  Patient presented with dizziness headache and weakness.  Patient's MRI was ending at the time of shift change.  MRI shows an acute punctate infarct of the left caudate head.  It also shows evidence of evolving subacute infarct in the corona radiata.  I will consult with neurohospitalist and medical  service for admission    Case discussed with Dr Amada Jupiter.  Neurology will consult on patient    Linwood Dibbles, MD 07/11/22 1743

## 2022-07-11 NOTE — ED Notes (Signed)
ED TO INPATIENT HANDOFF REPORT  ED Nurse Name and Phone #: Victorino Dike [phone]  S Name/Age/Gender Bailey [name] 72 y.o. female Room/Bed: 030C/030C  Code Status   Code Status: Full Code  Home/SNF/Other Home Patient oriented to: self, place, time, and situation Is this baseline? Yes   Triage Complete: Triage complete  Chief Complaint Acute CVA (cerebrovascular accident) [facility]) [I63.9]  Triage Note Pt to ER via EMS from home with c/o onset headache, dizziness and weakness.  Noted hypotension with EMS, bolus 500cc fluid with minor improvement. Per family pt is her normal mental state which is short term memory loss.  NIH negative for EMS and at this time.  Family state pt accidentally took double dose of Ferric Sulfate 210mg  (took 6 instead of 3 tabs). Dr. Karene Fry to bedside for assessment.   Allergies No Known Allergies  Level of Care/Admitting Diagnosis ED Disposition     ED Disposition  Admit   Condition  --   Comment  Hospital Area: [facility] [[id number]]  Level of Care: Telemetry Medical [104]  May place patient in observation at [facility] or [facility] if equivalent level of care is available:: No  Covid Evaluation: Asymptomatic - no recent exposure (last 10 days) testing not required  Diagnosis: Acute CVA (cerebrovascular accident) [facility]) [[id number]]  Admitting Physician: Dickie La [[id number]]  Attending Physician: Dickie La [[id number]]          B Medical/Surgery History Past Medical History:  Diagnosis Date   Anemia    patient preference - stopped iron    ARF (acute renal failure) (HCC) 04/2016   dehydration   Chronic kidney disease    stage 5   DDD (degenerative disc disease), lumbar    Depression    Diabetes mellitus    Type II - pt preference - stopped lantus   Diabetic retinopathy (HCC)    Diverticulitis    History of kidney stones    passed- 6   Hypertension    Lacunar infarction (HCC)    Osteomyelitis (HCC)    Polyneuropathy     Vitamin B 12 deficiency 06/09/2017   Vitamin D deficiency    Wears partial dentures    upper   Past Surgical History:  Procedure Laterality Date   AMPUTATION Right 08/14/2016   Procedure: RIGHT 1ST RAY AMPUTATION MID-SHAFT;  Surgeon: Nadara Mustard, MD;  Location: [facility] OR;  Service: Orthopedics;  Laterality: Right;   BASCILIC VEIN TRANSPOSITION Left 06/08/2019   Procedure: LEFT BRACHIOCEPHALIC VEIN CREATION;  Surgeon: [name], MD;  Location: [facility] OR;  Service: Vascular;  Laterality: Left;   CATARACT EXTRACTION  2018   INCISION AND DRAINAGE ABSCESS Left 02/11/2014   Procedure: INCISION AND DRAINAGE ABSCESS Left Buttock;  Surgeon: Avel Peace, MD;  Location: [facility] ORS;  Service: General;  Laterality: Left;   INSERTION OF DIALYSIS CATHETER Left 09/07/2019   Procedure: INSERTION OF LEFT INTERNAL JUGULAR DIALYSIS CATHETER;  Surgeon: Larina Earthly, MD;  Location: [facility] OR;  Service: Vascular;  Laterality: Left;   IR REMOVAL TUN CV CATH W/O FL  09/04/2019   LAPAROSCOPIC SMALL BOWEL RESECTION N/A 05/09/2016   Procedure: LAPAROSCOPIC SMALL BOWEL RESECTION;  Surgeon: Karie Soda, MD;  Location: [facility] ORS;  Service: General;  Laterality: N/A;   LAPAROSCOPY N/A 05/09/2016   Procedure: LAPAROSCOPY DIAGNOSTIC, LYSIS OF ADHESIONS, SMALL BOWEL RESECTION X 2;  Surgeon: Karie Soda, MD;  Location: [facility] ORS;  Service: General;  Laterality: N/A;   TOE AMPUTATION  left foot great toe     A IV Location/Drains/Wounds Patient Lines/Drains/Airways Status     Active Line/Drains/Airways     Name Placement date Placement time Site Days   Peripheral IV 07/11/22 18 G Anterior;Proximal;Right Forearm 07/11/22  1053  Forearm  less than 1   Fistula / Graft Left Upper arm Arteriovenous fistula 06/08/19  1146  Upper arm  1129   Hemodialysis Catheter Left Internal jugular Double lumen Permanent (Tunneled) 09/07/19  1055  Internal jugular  1038   Incision - 3 Ports Abdomen 1: Left;Upper 2: Left;Medial 3:  Left;Lower 05/09/16  0835  -- 2254            Intake/Output Last 24 hours No intake or output data in the 24 hours ending 07/11/22 1758  Labs/Imaging Results for orders placed or performed during the hospital encounter of 07/11/22 (from the past 48 hour(s))  CBG monitoring, ED     Status: Abnormal   Collection Time: 07/11/22 10:54 AM  Result Value Ref Range   Glucose-Capillary 188 (H) 70 - 99 mg/dL    Comment: Glucose reference range applies only to samples taken after fasting for at least 8 hours.  Urine rapid drug screen (hosp performed)     Status: None   Collection Time: 07/11/22 11:02 AM  Result Value Ref Range   Opiates NONE DETECTED NONE DETECTED   Cocaine NONE DETECTED NONE DETECTED   Benzodiazepines NONE DETECTED NONE DETECTED   Amphetamines NONE DETECTED NONE DETECTED   Tetrahydrocannabinol NONE DETECTED NONE DETECTED   Barbiturates NONE DETECTED NONE DETECTED    Comment: (NOTE) DRUG SCREEN FOR MEDICAL PURPOSES ONLY.  IF CONFIRMATION IS NEEDED FOR ANY PURPOSE, NOTIFY LAB WITHIN 5 DAYS.  LOWEST DETECTABLE LIMITS FOR URINE DRUG SCREEN Drug Class                     Cutoff (ng/mL) Amphetamine and metabolites    1000 Barbiturate and metabolites    200 Benzodiazepine                 200 Opiates and metabolites        300 Cocaine and metabolites        300 THC                            50 Performed at [facility] Lab, [address] [place], [place] [address]   Urinalysis, Routine w reflex microscopic -Urine, Clean Catch     Status: Abnormal   Collection Time: 07/11/22 11:02 AM  Result Value Ref Range   Color, Urine YELLOW YELLOW   APPearance HAZY (A) CLEAR   Specific Gravity, Urine 1.008 1.005 - 1.030   pH 8.0 5.0 - 8.0   Glucose, UA 50 (A) NEGATIVE mg/dL   Hgb urine dipstick NEGATIVE NEGATIVE   Bilirubin Urine NEGATIVE NEGATIVE   Ketones, ur NEGATIVE NEGATIVE mg/dL   Protein, ur >=604 (A) NEGATIVE mg/dL   Nitrite NEGATIVE NEGATIVE   Leukocytes,Ua  LARGE (A) NEGATIVE   RBC / HPF 0-5 0 - 5 RBC/hpf   WBC, UA >50 0 - 5 WBC/hpf   Bacteria, UA NONE SEEN NONE SEEN   Squamous Epithelial / HPF 0-5 0 - 5 /HPF   Non Squamous Epithelial 0-5 (A) NONE SEEN    Comment: Performed at [facility] Lab, [address] [place], [place] [address]  Ethanol     Status: None   Collection  Time: 07/11/22 11:06 AM  Result Value Ref Range   Alcohol, Ethyl (B) <10 <10 mg/dL    Comment: (NOTE) Lowest detectable limit for serum alcohol is 10 mg/dL.  For medical purposes only. Performed at [facility] Lab, [address] [place], [place] [address]   Protime-INR     Status: Abnormal   Collection Time: 07/11/22 11:06 AM  Result Value Ref Range   Prothrombin Time 15.9 (H) 11.4 - 15.2 seconds   INR 1.2 0.8 - 1.2    Comment: (NOTE) INR goal varies based on device and disease states. Performed at [facility] Lab, [address], [place], [place] [address]   APTT     Status: None   Collection Time: 07/11/22 11:06 AM  Result Value Ref Range   aPTT 32 24 - 36 seconds    Comment: Performed at [facility] Lab, [address] [place], [place] [address]  CBC     Status: Abnormal   Collection Time: 07/11/22 11:06 AM  Result Value Ref Range   WBC 4.7 4.0 - 10.5 K/uL   RBC 2.86 (L) 3.87 - 5.11 MIL/uL   Hemoglobin 8.8 (L) 12.0 - 15.0 g/dL   HCT 91.4 (L) 78.2 - 95.6 %   MCV 100.3 (H) 80.0 - 100.0 fL   MCH 30.8 26.0 - 34.0 pg   MCHC 30.7 30.0 - 36.0 g/dL   RDW 21.3 (H) 08.6 - 57.8 %   Platelets 73 (L) 150 - 400 K/uL    Comment: Immature Platelet Fraction may be clinically indicated, consider ordering this additional test [id number] REPEATED TO VERIFY PLATELET COUNT CONFIRMED BY SMEAR    nRBC 0.0 0.0 - 0.2 %    Comment: Performed at [facility] Lab, [address], [place], [place] [address]  Differential     Status: None   Collection Time: 07/11/22 11:06 AM  Result Value Ref Range   Neutrophils Relative % 61 %   Neutro Abs 2.8 1.7 - 7.7  K/uL   Lymphocytes Relative 28 %   Lymphs Abs 1.3 0.7 - 4.0 K/uL   Monocytes Relative 8 %   Monocytes Absolute 0.4 0.1 - 1.0 K/uL   Eosinophils Relative 3 %   Eosinophils Absolute 0.1 0.0 - 0.5 K/uL   Basophils Relative 0 %   Basophils Absolute 0.0 0.0 - 0.1 K/uL   Immature Granulocytes 0 %   Abs Immature Granulocytes 0.01 0.00 - 0.07 K/uL    Comment: Performed at [facility] Lab, [address] [place], [place] [address]  Comprehensive metabolic panel     Status: Abnormal   Collection Time: 07/11/22 11:06 AM  Result Value Ref Range   Sodium 136 135 - 145 mmol/L   Potassium 3.3 (L) 3.5 - 5.1 mmol/L   Chloride 96 (L) 98 - 111 mmol/L   CO2 32 22 - 32 mmol/L   Glucose, Bld 176 (H) 70 - 99 mg/dL    Comment: Glucose reference range applies only to samples taken after fasting for at least 8 hours.   BUN 18 8 - 23 mg/dL   Creatinine, Ser 2.72 (H) 0.44 - 1.00 mg/dL   Calcium 8.1 (L) 8.9 - 10.3 mg/dL   Total Protein 5.4 (L) 6.5 - 8.1 g/dL   Albumin 2.8 (L) 3.5 - 5.0 g/dL   AST 15 15 - 41 U/L   ALT 9 0 - 44 U/L   Alkaline Phosphatase 77 38 - 126 U/L   Total Bilirubin 0.6  0.3 - 1.2 mg/dL   GFR, Estimated 15 (L) >60 mL/min    Comment: (NOTE) Calculated using the CKD-EPI Creatinine Equation (2021)    Anion gap 8 5 - 15    Comment: Performed at [facility] Lab, [address], [place], [place] [address]  Iron (Free Iron)     Status: None   Collection Time: 07/11/22 11:06 AM  Result Value Ref Range   Iron 58 28 - 170 ug/dL    Comment: Performed at [facility] Lab, [address] [place], [place] [address]  I-stat chem 8, ED     Status: Abnormal   Collection Time: 07/11/22 11:14 AM  Result Value Ref Range   Sodium 139 135 - 145 mmol/L   Potassium 3.5 3.5 - 5.1 mmol/L   Chloride 95 (L) 98 - 111 mmol/L   BUN 21 8 - 23 mg/dL   Creatinine, Ser 7.84 (H) 0.44 - 1.00 mg/dL   Glucose, Bld 696 (H) 70 - 99 mg/dL    Comment: Glucose reference range applies only to samples taken  after fasting for at least 8 hours.   Calcium, Ion 1.05 (L) 1.15 - 1.40 mmol/L   TCO2 34 (H) 22 - 32 mmol/L   Hemoglobin 9.5 (L) 12.0 - 15.0 g/dL   HCT 29.5 (L) 28.4 - 13.2 %   MR BRAIN WO CONTRAST  Result Date: 07/11/2022 CLINICAL DATA:  Neuro deficit, acute, stroke suspected. Sudden severe headache. EXAM: MRI HEAD WITHOUT CONTRAST TECHNIQUE: Multiplanar, multiecho pulse sequences of the brain and surrounding structures were obtained without intravenous contrast. COMPARISON:  MR head without contrast scratched at CT head without contrast 07/11/2022. MR head without contrast 02/08/2022 FINDINGS: Brain: Diffusion trace signal hyperintensities are present in the corona radiata bilaterally. These demonstrate T2 shine through on the [facility] maps. Acute punctate infarct is present in the left caudate head. No other acute infarcts are present. Confluent T2 and subcortical FLAIR hyperintensities are moderately advanced for age. Moderate atrophy is also advanced for age. The ventricles are proportionate to the degree of atrophy. No significant extraaxial fluid collection is present. White matter changes extending into the brainstem are stable. Cerebellum is within normal limits. Midline structures are within normal limits. Vascular: Flow is present in the major intracranial arteries. Skull and upper cervical spine: The craniocervical junction is normal. Upper cervical spine is within normal limits. T1 marrow signal is somewhat depressed throughout, likely corresponding to the knee a. No focal lesions are present. Sinuses/Orbits: The paranasal sinuses and mastoid air cells are clear. Bilateral lens replacements are noted. Globes and orbits are otherwise unremarkable. IMPRESSION: 1. Acute punctate infarct of the left caudate head. 2. Diffusion trace signal hyperintensities in the corona radiata bilaterally likely reflect evolving subacute infarcts. 3. Moderate atrophy and white matter disease is moderately advanced for  age. This likely reflects the sequela of chronic microvascular ischemia. Electronically Signed   By: Marin Roberts M.D.   On: 07/11/2022 16:24   CT HEAD WO CONTRAST  Result Date: 07/11/2022 CLINICAL DATA:  Headache, sudden, severe EXAM: CT HEAD WITHOUT CONTRAST TECHNIQUE: Contiguous axial images were obtained from the base of the skull through the vertex without intravenous contrast. RADIATION DOSE REDUCTION: This exam was performed according to the departmental dose-optimization program which includes automated exposure control, adjustment of the mA and/or kV according to patient size and/or use of iterative reconstruction technique. COMPARISON:  07/27/2021 FINDINGS: Brain: No evidence of acute infarction, hemorrhage, hydrocephalus, extra-axial collection or mass lesion/mass effect. Chronic  right basal ganglia lacunar infarct. Patchy low-density changes within the periventricular and subcortical white matter most compatible with chronic microvascular ischemic change. Mild-moderate diffuse cerebral volume loss. Vascular: Atherosclerotic calcifications involving the large vessels of the skull base. No unexpected hyperdense vessel. Skull: Normal. Negative for fracture or focal lesion. Sinuses/Orbits: No acute finding. Other: None. IMPRESSION: 1. No acute intracranial findings. 2. Chronic microvascular ischemic change and cerebral volume loss. Electronically Signed   By: Duanne Guess D.O.   On: 07/11/2022 11:50    Pending Labs Unresulted Labs (From admission, onward)     Start     Ordered   07/12/22 0500  Lipid panel  (Labs)  Tomorrow morning,   R       Comments: Fasting    07/11/22 1746   07/11/22 1749  Magnesium  Add-on,   AD        07/11/22 1748   07/11/22 1716  Hemoglobin A1c  (Labs)  Once,   R       Comments: To assess prior glycemic control    07/11/22 1746            Vitals/Pain Today's Vitals   07/11/22 1645 07/11/22 1646 07/11/22 1700 07/11/22 1715  BP: (!) 181/62  (!)  165/58 (!) 164/62  Pulse: 72  68 71  Resp:      Temp:  98 F (36.7 C)    TempSrc:      SpO2: 97%  98% 96%  Weight:      Height:      PainSc:        Isolation Precautions No active isolations  Medications Medications   stroke: early stages of recovery book (has no administration in time range)  acetaminophen (TYLENOL) tablet 650 mg (has no administration in time range)    Or  acetaminophen (TYLENOL) 160 MG/5ML solution 650 mg (has no administration in time range)    Or  acetaminophen (TYLENOL) suppository 650 mg (has no administration in time range)  enoxaparin (LOVENOX) injection 30 mg (has no administration in time range)  aspirin suppository 300 mg (has no administration in time range)    Or  aspirin tablet 325 mg (has no administration in time range)  aspirin EC tablet 81 mg (has no administration in time range)  clopidogrel (PLAVIX) tablet 300 mg (has no administration in time range)  clopidogrel (PLAVIX) tablet 75 mg (has no administration in time range)  insulin aspart (novoLOG) injection 0-6 Units (has no administration in time range)    Mobility walks with person assist     Focused Assessments Neuro Assessment Handoff:  Swallow screen pass? Yes    NIH Stroke Scale  Dizziness Present: Yes Headache Present: Yes Interval: Initial Level of Consciousness (1a.)   : Alert, keenly responsive LOC Questions (1b. )   : Answers both questions correctly LOC Commands (1c. )   : Performs both tasks correctly Best Gaze (2. )  : Normal Visual (3. )  : No visual loss Facial Palsy (4. )    : Normal symmetrical movements Motor Arm, Left (5a. )   : No drift Motor Arm, Right (5b. ) : No drift Motor Leg, Left (6a. )  : No drift Motor Leg, Right (6b. ) : No drift Limb Ataxia (7. ): Absent Sensory (8. )  : Normal, no sensory loss Best Language (9. )  : No aphasia Dysarthria (10. ): Normal Extinction/Inattention (11.)   : No Abnormality Complete NIHSS TOTAL: 0 Last date  known well: 07/11/22  Last time known well: 0935 Neuro Assessment:   Neuro Checks:   Initial (07/11/22 1102)  Has TPA been given? No If patient is a Neuro Trauma and patient is going to OR before floor call report to 4N Charge nurse: [phone] or [phone]   R Recommendations: See Admitting Provider Note  Report given to:   Additional Notes:

## 2022-07-12 ENCOUNTER — Observation Stay (HOSPITAL_COMMUNITY): Payer: Medicare Other

## 2022-07-12 ENCOUNTER — Observation Stay (HOSPITAL_BASED_OUTPATIENT_CLINIC_OR_DEPARTMENT_OTHER): Payer: Medicare Other

## 2022-07-12 DIAGNOSIS — E876 Hypokalemia: Secondary | ICD-10-CM

## 2022-07-12 DIAGNOSIS — I639 Cerebral infarction, unspecified: Secondary | ICD-10-CM

## 2022-07-12 DIAGNOSIS — N186 End stage renal disease: Secondary | ICD-10-CM

## 2022-07-12 DIAGNOSIS — I6621 Occlusion and stenosis of right posterior cerebral artery: Secondary | ICD-10-CM | POA: Diagnosis not present

## 2022-07-12 DIAGNOSIS — E46 Unspecified protein-calorie malnutrition: Secondary | ICD-10-CM | POA: Diagnosis not present

## 2022-07-12 DIAGNOSIS — I6389 Other cerebral infarction: Secondary | ICD-10-CM | POA: Diagnosis not present

## 2022-07-12 DIAGNOSIS — D696 Thrombocytopenia, unspecified: Secondary | ICD-10-CM | POA: Diagnosis not present

## 2022-07-12 DIAGNOSIS — E1122 Type 2 diabetes mellitus with diabetic chronic kidney disease: Secondary | ICD-10-CM | POA: Diagnosis not present

## 2022-07-12 DIAGNOSIS — E785 Hyperlipidemia, unspecified: Secondary | ICD-10-CM | POA: Diagnosis not present

## 2022-07-12 DIAGNOSIS — Z992 Dependence on renal dialysis: Secondary | ICD-10-CM

## 2022-07-12 DIAGNOSIS — I120 Hypertensive chronic kidney disease with stage 5 chronic kidney disease or end stage renal disease: Secondary | ICD-10-CM

## 2022-07-12 LAB — GLUCOSE, CAPILLARY
Glucose-Capillary: 128 mg/dL — ABNORMAL HIGH (ref 70–99)
Glucose-Capillary: 109 mg/dL — ABNORMAL HIGH (ref 70–99)
Glucose-Capillary: 125 mg/dL — ABNORMAL HIGH (ref 70–99)
Glucose-Capillary: 185 mg/dL — ABNORMAL HIGH (ref 70–99)

## 2022-07-12 LAB — CBC WITH DIFFERENTIAL/PLATELET
Abs Immature Granulocytes: 0.01 10*3/uL (ref 0.00–0.07)
Basophils Absolute: 0 10*3/uL (ref 0.0–0.1)
Basophils Relative: 1 %
Eosinophils Absolute: 0.2 10*3/uL (ref 0.0–0.5)
Eosinophils Relative: 3 %
HCT: 29.7 % — ABNORMAL LOW (ref 36.0–46.0)
Hemoglobin: 9.3 g/dL — ABNORMAL LOW (ref 12.0–15.0)
Immature Granulocytes: 0 %
Lymphocytes Relative: 44 %
Lymphs Abs: 2.3 10*3/uL (ref 0.7–4.0)
MCH: 30.8 pg (ref 26.0–34.0)
MCHC: 31.3 g/dL (ref 30.0–36.0)
MCV: 98.3 fL (ref 80.0–100.0)
Monocytes Absolute: 0.5 10*3/uL (ref 0.1–1.0)
Monocytes Relative: 9 %
Neutro Abs: 2.2 10*3/uL (ref 1.7–7.7)
Neutrophils Relative %: 43 %
Platelets: 71 10*3/uL — ABNORMAL LOW (ref 150–400)
RBC: 3.02 MIL/uL — ABNORMAL LOW (ref 3.87–5.11)
RDW: 16.1 % — ABNORMAL HIGH (ref 11.5–15.5)
WBC: 5.3 10*3/uL (ref 4.0–10.5)
nRBC: 0 % (ref 0.0–0.2)

## 2022-07-12 LAB — RENAL FUNCTION PANEL
Albumin: 2.8 g/dL — ABNORMAL LOW (ref 3.5–5.0)
Anion gap: 10 (ref 5–15)
BUN: 32 mg/dL — ABNORMAL HIGH (ref 8–23)
CO2: 28 mmol/L (ref 22–32)
Calcium: 8.5 mg/dL — ABNORMAL LOW (ref 8.9–10.3)
Chloride: 99 mmol/L (ref 98–111)
Creatinine, Ser: 4.09 mg/dL — ABNORMAL HIGH (ref 0.44–1.00)
GFR, Estimated: 11 mL/min — ABNORMAL LOW (ref >60)
Glucose, Bld: 117 mg/dL — ABNORMAL HIGH (ref 70–99)
Phosphorus: 2 mg/dL — ABNORMAL LOW (ref 2.5–4.6)
Potassium: 3.5 mmol/L (ref 3.5–5.1)
Sodium: 137 mmol/L (ref 135–145)

## 2022-07-12 LAB — LIPID PANEL
Cholesterol: 101 mg/dL (ref 0–200)
HDL: 37 mg/dL — ABNORMAL LOW (ref >40)
LDL Cholesterol: 46 mg/dL (ref 0–99)
Total CHOL/HDL Ratio: 2.7 RATIO
Triglycerides: 91 mg/dL (ref <150)
VLDL: 18 mg/dL (ref 0–40)

## 2022-07-12 LAB — TECHNOLOGIST SMEAR REVIEW: Plt Morphology: DECREASED

## 2022-07-12 LAB — ECHOCARDIOGRAM COMPLETE
AR max vel: 0.9 cm2
AV Area VTI: 1.01 cm2
AV Area mean vel: 1.04 cm2
AV Mean grad: 23.9 mmHg
AV Peak grad: 42.1 mmHg
Ao pk vel: 3.25 m/s
Area-P 1/2: 1.96 cm2
Height: 64 in
S' Lateral: 3.2 cm
Weight: 1763.68 oz

## 2022-07-12 LAB — HEPATITIS B SURFACE ANTIGEN: Hepatitis B Surface Ag: NONREACTIVE

## 2022-07-12 MED ORDER — CHLORHEXIDINE GLUCONATE CLOTH 2 % EX PADS
6.0000 | MEDICATED_PAD | Freq: Every day | CUTANEOUS | Status: DC
  Administered 2022-07-13 – 2022-07-14 (×2): 6 via TOPICAL

## 2022-07-12 NOTE — Evaluation (Signed)
Physical Therapy Evaluation and Discharge Patient Details Name: Bailey [name] MRN: [id number] DOB: May 08, 1950 Today's Date: 07/12/2022  History of Present Illness  Pt is a 71 y,o. F who presents 07/11/2022 with acute onset of dizziness, headache and weakness this morning. MRI showing acute left caudate punctuate stroke. Significant PMH: T2DM, thromboyctyopenia, ESRD on MWF HD, HTN, bilateral first toe amputations, reported prior stroke.  Clinical Impression  Pt admitted with above. PTA, pt lives with her family, is independent with ADL's and requires assist with IADL's in setting of memory loss. Pt denies history of recent falls. Pt seems to be fairly close to her functional baseline. Ambulating 200 ft with no assistive device at a min guard assist level (has chronic dynamic balance deficit due to history of bilateral toe amputations). Has mild bilateral UE ataxia on examination, but able to perform ADL's such as brushing teeth at sink without difficulty. Has good family support at d/c. Would benefit from follow up HHPT to continue to work on high level balance and strengthening in setting of falls. No further acute PT needs. Thank you for this consult.     Recommendations for follow up therapy are one component of a multi-disciplinary discharge planning process, led by the attending physician.  Recommendations may be updated based on patient status, additional functional criteria and insurance authorization.  Follow Up Recommendations       Assistance Recommended at Discharge Intermittent Supervision/Assistance  Patient can return home with the following  A little help with walking and/or transfers;Assistance with cooking/housework;Direct supervision/assist for medications management;Direct supervision/assist for financial management;Assist for transportation;Help with stairs or ramp for entrance    Equipment Recommendations None recommended by PT  Recommendations for Other Services        Functional Status Assessment Patient has not had a recent decline in their functional status     Precautions / Restrictions Precautions Precautions: Fall Restrictions Weight Bearing Restrictions: No      Mobility  Bed Mobility Overal bed mobility: Modified Independent                  Transfers Overall transfer level: Needs assistance Equipment used: None Transfers: Sit to/from Stand Sit to Stand: Min guard, Min assist           General transfer comment: Up to minA from lower toilet seat, cues for use of grab bar    Ambulation/Gait Ambulation/Gait assistance: Min guard Gait Distance (Feet): 200 Feet Assistive device: None Gait Pattern/deviations: Step-through pattern, Decreased stride length, Decreased dorsiflexion - right, Decreased dorsiflexion - left Gait velocity: decreased     General Gait Details: Mild dynamic imbalance (chronic), requiring min guard assist. Able to perform head turns without overt LOB  Stairs            Wheelchair Mobility    Modified Rankin (Stroke Patients Only) Modified Rankin (Stroke Patients Only) Pre-Morbid Rankin Score: Moderate disability Modified Rankin: Moderately severe disability     Balance Overall balance assessment: Mild deficits observed, not formally tested                                           Pertinent Vitals/Pain Pain Assessment Pain Assessment: No/denies pain  Supine BP: 175/55 (89) Sitting BP: 165/62 (92)    Home Living Family/patient expects to be discharged to:: Private residence Living Arrangements: Spouse/significant other Available Help at Discharge: Family Type of Home: House  Home Access: Stairs to enter Entrance Stairs-Rails: Right;Left Entrance Stairs-Number of Steps: 6   Home Layout: One level Home Equipment: Agricultural consultant (2 wheels);Cane - single point      Prior Function Prior Level of Function : Needs assist             Mobility Comments:  Dynamic imbalance at baseline due to bilateral toe amputations, prefers HHA for community ambulation ADLs Comments: assist for IADL's due to memory loss, wears Depends     Hand Dominance        Extremity/Trunk Assessment   Upper Extremity Assessment Upper Extremity Assessment: Defer to OT evaluation    Lower Extremity Assessment Lower Extremity Assessment: Overall WFL for tasks assessed    Cervical / Trunk Assessment Cervical / Trunk Assessment: Normal  Communication   Communication: No difficulties  Cognition Arousal/Alertness: Awake/alert Behavior During Therapy: WFL for tasks assessed/performed Overall Cognitive Status: History of cognitive impairments - at baseline                                 General Comments: Hx STM deficits at baseline; pt is pleasant and demonstrates fair problem solving and thinking skills throughout evaluation. Following all commands        General Comments      Exercises     Assessment/Plan    PT Assessment Patient does not need any further PT services  PT Problem List Decreased mobility;Decreased activity tolerance;Decreased balance;Decreased cognition       PT Treatment Interventions      PT Goals (Current goals can be found in the Care Plan section)  Acute Rehab PT Goals Patient Stated Goal: go home PT Goal Formulation: All assessment and education complete, DC therapy    Frequency       Co-evaluation               AM-PAC PT "6 Clicks" Mobility  Outcome Measure Help needed turning from your back to your side while in a flat bed without using bedrails?: None Help needed moving from lying on your back to sitting on the side of a flat bed without using bedrails?: None Help needed moving to and from a bed to a chair (including a wheelchair)?: A Little Help needed standing up from a chair using your arms (e.g., wheelchair or bedside chair)?: A Little Help needed to walk in hospital room?: A Little Help  needed climbing 3-5 steps with a railing? : A Little 6 Click Score: 20    End of Session Equipment Utilized During Treatment: Gait belt Activity Tolerance: Patient tolerated treatment well Patient left: in chair;with call bell/phone within reach;with chair alarm set Nurse Communication: Mobility status PT Visit Diagnosis: Unsteadiness on feet (R26.81)    Time: 6578-4696 PT Time Calculation (min) (ACUTE ONLY): 36 min   Charges:   PT Evaluation $PT Eval Low Complexity: 1 Low PT Treatments $Therapeutic Activity: 8-22 mins       Lillia Pauls, PT, DPT Acute Rehabilitation Services Office [phone]    Norval Morton 07/12/2022, 8:38 AM

## 2022-07-12 NOTE — Progress Notes (Signed)
Carotid duplex bilateral study completed.   Please see CV Proc for preliminary results.   Zebadiah Willert, RDMS, RVT  

## 2022-07-12 NOTE — Progress Notes (Signed)
SLP Cancellation Note  Patient Details Name: Bailey [name] MRN: [id number] DOB: 06-16-1950   Cancelled treatment:       Reason Eval/Treat Not Completed: Patient at procedure or test/unavailable  SLP has made three attempts this date for cognitive evaluation. She has been off unit for two attempts and was working with OT for one attempt. SLP service to continue to follow for cognitive evaluation when able.  Of note, pt and family report cognition is presently at baseline (mild memory deficits). Family assists with IADLs. If she discharges prior to SLP evaluation, recommend outpatient SLP for thorough evaluation of cognition if pt is agreeable.  [name] P. [name], M.S., CCC-SLP Speech-Language Pathologist Acute Rehabilitation Services Pager: [phone]  [name] [name] 07/12/2022, 2:35 PM

## 2022-07-12 NOTE — Progress Notes (Signed)
Subjective:   Summary: Bailey [name] is a 72 y.o. year old female currently admitted on the IMTS HD#0 for stroke.  Overnight Events: NAEON.  Hypertensive overnight.  SVT noted on telemetry. She feels like her symptoms have resolved.  She denies additional headaches since yesterday.  No further weakness or dizziness, no chest pain, dyspnea.  Feels like her memory is improving.  Able to tolerate p.o. intake.  Son is in the room at the time of assessment today and we discussed goals of care/advanced directives.  Seems like she will remain full code for now, but they are open to discussing these issues and documentation.  Will see if case management can give him some copies of appropriate documentation.  Answered all questions and discussed plan for today.  Objective:  Vital signs in last 24 hours: Vitals:   07/11/22 1951 07/11/22 2149 07/12/22 0144 07/12/22 0439  BP: (!) 174/62 (!) 156/84 (!) 177/62 (!) 171/62  Pulse: 67  67 60  Resp: 16 18 18 18   Temp: 98.1 F (36.7 C)  97.7 F (36.5 C) 97.8 F (36.6 C)  TempSrc: Oral  Oral Oral  SpO2: 98%  98% 97%  Weight:      Height:       Supplemental O2: Room Air SpO2: 97 %   Physical Exam:  Constitutional: NAD Cardiovascular: RRR, loud systolic murmur right upper sternal border Pulmonary/Chest: normal work of breathing on room air Abdominal: soft, non-tender, non-distended Extremities: warm, well perfused, extremity pulses 2+, no BLE pitting edema.  Left upper extremity fistula with palpable thrill Neuro: Alert and oriented x 3..  Able to repeat 3 objects, but does not remember after 5 minutes.  EOMI, PERRL, cranial nerves grossly intact.  Left upper extremity 4/5 strength, right upper extremity and bilateral lower extremities 5/5 strength.  Ataxia with finger-nose testing on left side.  Filed Weights   07/11/22 1101  Weight: 50 kg    No intake or output data in the 24 hours ending 07/12/22 1610 Net IO Since  Admission: No IO data has been entered for this period [07/12/22 0628]  Pertinent Labs:    Latest Ref Rng & Units 07/12/2022    4:22 AM 07/11/2022   11:14 AM 07/11/2022   11:06 AM  CBC  WBC 4.0 - 10.5 K/uL 5.3   4.7   Hemoglobin 12.0 - 15.0 g/dL 9.3  9.5  8.8   Hematocrit 36.0 - 46.0 % 29.7  28.0  28.7   Platelets 150 - 400 K/uL 71   73        Latest Ref Rng & Units 07/12/2022    4:22 AM 07/11/2022   11:14 AM 07/11/2022   11:06 AM  CMP  Glucose 70 - 99 mg/dL 960  454  098   BUN 8 - 23 mg/dL 32  21  18   Creatinine 0.44 - 1.00 mg/dL 1.19  1.47  8.29   Sodium 135 - 145 mmol/L 137  139  136   Potassium 3.5 - 5.1 mmol/L 3.5  3.5  3.3   Chloride 98 - 111 mmol/L 99  95  96   CO2 22 - 32 mmol/L 28   32   Calcium 8.9 - 10.3 mg/dL 8.5   8.1   Total Protein 6.5 - 8.1 g/dL   5.4   Total Bilirubin 0.3 - 1.2 mg/dL   0.6   Alkaline Phos  38 - 126 U/L   77   AST 15 - 41 U/L   15   ALT 0 - 44 U/L   9    Imaging: MR BRAIN WO CONTRAST  Result Date: 07/11/2022 CLINICAL DATA:  Neuro deficit, acute, stroke suspected. Sudden severe headache. EXAM: MRI HEAD WITHOUT CONTRAST TECHNIQUE: Multiplanar, multiecho pulse sequences of the brain and surrounding structures were obtained without intravenous contrast. COMPARISON:  MR head without contrast scratched at CT head without contrast 07/11/2022. MR head without contrast 02/08/2022 FINDINGS: Brain: Diffusion trace signal hyperintensities are present in the corona radiata bilaterally. These demonstrate T2 shine through on the [facility] maps. Acute punctate infarct is present in the left caudate head. No other acute infarcts are present. Confluent T2 and subcortical FLAIR hyperintensities are moderately advanced for age. Moderate atrophy is also advanced for age. The ventricles are proportionate to the degree of atrophy. No significant extraaxial fluid collection is present. White matter changes extending into the brainstem are stable. Cerebellum is within normal limits.  Midline structures are within normal limits. Vascular: Flow is present in the major intracranial arteries. Skull and upper cervical spine: The craniocervical junction is normal. Upper cervical spine is within normal limits. T1 marrow signal is somewhat depressed throughout, likely corresponding to the knee a. No focal lesions are present. Sinuses/Orbits: The paranasal sinuses and mastoid air cells are clear. Bilateral lens replacements are noted. Globes and orbits are otherwise unremarkable. IMPRESSION: 1. Acute punctate infarct of the left caudate head. 2. Diffusion trace signal hyperintensities in the corona radiata bilaterally likely reflect evolving subacute infarcts. 3. Moderate atrophy and white matter disease is moderately advanced for age. This likely reflects the sequela of chronic microvascular ischemia. Electronically Signed   By: Marin Roberts M.D.   On: 07/11/2022 16:24   CT HEAD WO CONTRAST  Result Date: 07/11/2022 CLINICAL DATA:  Headache, sudden, severe EXAM: CT HEAD WITHOUT CONTRAST TECHNIQUE: Contiguous axial images were obtained from the base of the skull through the vertex without intravenous contrast. RADIATION DOSE REDUCTION: This exam was performed according to the departmental dose-optimization program which includes automated exposure control, adjustment of the mA and/or kV according to patient size and/or use of iterative reconstruction technique. COMPARISON:  07/27/2021 FINDINGS: Brain: No evidence of acute infarction, hemorrhage, hydrocephalus, extra-axial collection or mass lesion/mass effect. Chronic right basal ganglia lacunar infarct. Patchy low-density changes within the periventricular and subcortical white matter most compatible with chronic microvascular ischemic change. Mild-moderate diffuse cerebral volume loss. Vascular: Atherosclerotic calcifications involving the large vessels of the skull base. No unexpected hyperdense vessel. Skull: Normal. Negative for fracture  or focal lesion. Sinuses/Orbits: No acute finding. Other: None. IMPRESSION: 1. No acute intracranial findings. 2. Chronic microvascular ischemic change and cerebral volume loss. Electronically Signed   By: Duanne Guess D.O.   On: 07/11/2022 11:50     EKG: My EKG interpretation is as follows: personally reviewed my interpretation is sinus bradycardia, borderline QTc, probable LVH.  1 episode of SVT noted overnight.  Assessment/Plan:   Principal Problem:   Acute CVA (cerebrovascular accident) [facility])   Patient Summary: Bailey [name] is a 72 y.o. female with PMH T2DM, thrombocytopenia, ESRD on MWF HD, malnutrition, HTN, reported prior stroke who presented with dizziness, headache, weakness and admitted for acute punctate left caudate infarct     #acute punctate left caudate infarct No further acute symptoms or changes.  She feels like her symptoms are improving.  Exam is slightly better today, but she is still weak  in left upper extremity and has some ataxia as well.  Memory is still poor, she is unable to remember 3 objects after 5 minutes despite prompting.  Unsure how much of this is chronic, she has pending outpatient workup for possible vascular dementia.  Undergoing workup for secondary prevention, neurology to assess today, appreciate recs - aspirin 81, Plavix 75 for 3 weeks and then aspirin monotherapy - Hypertension control as below - LDL 46.  Continue Lipitor 40. - Follow-up echo - Cardiac monitoring - A1c 4.8 - Appreciate further neurology recs - PT/OT   # ESRD on MWF HD #Hypokalemia Can likely get routine dialysis.  Will consult nephrology today to arrange.   #HTN Home regimen includes amlodipine 5, Coreg 25 twice daily, hydralazine 25 (only takes in the middle of the day), and Isordil 20 (prescribed 3 times daily but patient is only been taking once daily - Permissive hypertension for 48 hours followed by gradual reduction blood pressures to normotension.  Will probably  reinitiate her antihypertensives when it time.  Of note, she might need a separate regimen on dialysis and nondialysis days.   # Hx T2DM A1c 4.8, in goal range   #HLD LDL in goal range, continue Lipitor 40   #Malnutrition - Consult nutrition for management   #Thrombocytopenia Has history of thrombocytopenia of unknown etiology.  No active bleeding.  Need to watch in setting of initiating DAPT.  - Trend CBC, follow peripheral smear  Diet: Renal IVF: None,None VTE:  Holding in setting of thrombocytopenia, SCDs Code: Full PT/OT recs: Pending TOC recs:  Family Update:   Dispo: Anticipated discharge pending PT/OT workup and secondary stroke prevention workup  Lyndle Herrlich, MD PGY-1 Internal Medicine Resident Please contact the on call pager after 5 pm and on weekends at [phone].

## 2022-07-12 NOTE — TOC Initial Note (Signed)
Transition of Care [facility]) - Initial/Assessment Note    Patient Details  Name: Bailey [name] MRN: [id number] Date of Birth: 1950-12-01  Transition of [facility]) CM/SW Contact:    Lawerance Sabal, RN Phone Number: 07/12/2022, 3:52 PM  Clinical Narrative:                  Sherron Monday w patient's son Onalee Hua, and discussed needs for DC. Reviewed PT OT recommendations, No HH needed, no DME needs.  Onalee Hua states that he is the main caregiver, providing support during the day.  In agreement with PT recs.  Will continue to follow for TOC needs.    Arlinda, Cottom [phone]   Expected Discharge Plan: Home/Self Care Barriers to Discharge: Continued Medical Work up   Patient Goals and CMS Choice Patient states their goals for this hospitalization and ongoing recovery are:: return home   Choice offered to / list presented to : NA      Expected Discharge Plan and Services   Discharge Planning Services: CM Consult   Living arrangements for the past 2 months: Single Family Home                                      Prior Living Arrangements/Services Living arrangements for the past 2 months: Single Family Home Lives with:: Spouse                   Activities of Daily Living Home Assistive Devices/Equipment: None ADL Screening (condition at time of admission) Patient's cognitive ability adequate to safely complete daily activities?: Yes Is the patient deaf or have difficulty hearing?: No Does the patient have difficulty seeing, even when wearing glasses/contacts?: No Does the patient have difficulty concentrating, remembering, or making decisions?: Yes Patient able to express need for assistance with ADLs?: Yes Does the patient have difficulty dressing or bathing?: Yes Independently performs ADLs?: No Does the patient have difficulty walking or climbing stairs?: Yes Weakness of Legs: Both Weakness of Arms/Hands: None  Permission Sought/Granted                   Emotional Assessment              Admission diagnosis:  Acute CVA (cerebrovascular accident) [facility]) [I63.9] Patient Active Problem List   Diagnosis Date Noted   Acute CVA (cerebrovascular accident) (HCC) 07/11/2022   Pyelonephritis 04/27/2021   Malnutrition of moderate degree 09/05/2019   Hypokalemia 09/02/2019   Fever of unknown origin 09/02/2019   ESRD on dialysis (HCC) 08/20/2019   Benign essential HTN 08/20/2019   Symptomatic anemia 08/20/2019   Thrombocytopenia (HCC) 08/20/2019   CKD (chronic kidney disease) 05/23/2019   Type II diabetes mellitus, uncontrolled 12/07/2017   History of anemia due to chronic kidney disease 10/11/2017   Vitamin B 12 deficiency 06/09/2017   Great toe amputation status, right 09/04/2016   Osteomyelitis of toe of right foot (HCC)    Acute renal failure (ARF) (HCC) 05/10/2016   SBO (small bowel obstruction) (HCC) 05/08/2016   Diverticular disease of jejunum s/p resection 05/09/2016 05/08/2016   Normocytic anemia 05/05/2016   PCP:  Lewis Moccasin, MD Pharmacy:   [phone] - [place], [place] - [address] [place] [address] Phone: [phone] Fax: [phone]  [id number] [place],  - [address] AT [facility] [address]  [address] [place] [address] Phone: [phone] Fax: [phone]  [facility] [address] [place] [address] Phone: [phone] Fax: [phone]     Social Determinants of Health (SDOH) Social History: SDOH Screenings   Tobacco Use: Low Risk  (07/11/2022)   SDOH Interventions:     Readmission Risk Interventions     No data to display

## 2022-07-12 NOTE — Care Management Obs Status (Signed)
MEDICARE OBSERVATION STATUS NOTIFICATION   Patient Details  Name: Bailey [name] MRN: [id number] Date of Birth: February 02, 1951   Medicare Observation Status Notification Given:  Yes    Lawerance Sabal, RN 07/12/2022, 3:50 PM

## 2022-07-12 NOTE — Evaluation (Signed)
Occupational Therapy Evaluation Patient Details Name: Bailey [name] MRN: [id number] DOB: [birth year] Today's Date: 07/12/2022   History of Present Illness Pt is a 71 y,o. F who presents 07/11/2022 with acute onset of dizziness, headache and weakness this morning. MRI showing acute left caudate punctuate stroke. Significant PMH: T2DM, thromboyctyopenia, ESRD on MWF HD, HTN, bilateral first toe amputations, reported prior stroke.   Clinical Impression   Pt reports mild instability at baseline and has assist for IADLs at home, lives with spouse and son reports he is present daily to assist as well. Pt currently needing set up - min A for ADLs, mod I for bed mobility,and min guard-min A for transfers without AD. Pt with decreased BUE coordination, and impaired cognition, becoming anxious with cognitive questions. BP elevated during session, but below permissive HTN limit, RN notified via securechat. Pt presenting with impairments listed below, will follow acutely. Recommend OP OT at d/c.   BP supine 194/63 (100) BP seated 175/62 (93) BP standing 137/65 (88) BP once returned to supine after transfer to bathroom 196/62 (100)     Recommendations for follow up therapy are one component of a multi-disciplinary discharge planning process, led by the attending physician.  Recommendations may be updated based on patient status, additional functional criteria and insurance authorization.   Assistance Recommended at Discharge Intermittent Supervision/Assistance  Patient can return home with the following A little help with walking and/or transfers;A little help with bathing/dressing/bathroom;Direct supervision/assist for medications management;Direct supervision/assist for financial management;Assist for transportation;Help with stairs or ramp for entrance    Functional Status Assessment  Patient has had a recent decline in their functional status and demonstrates the ability to make significant  improvements in function in a reasonable and predictable amount of time.  Equipment Recommendations  None recommended by OT (pt has all needed DME)    Recommendations for Other Services PT consult     Precautions / Restrictions Precautions Precautions: Fall Restrictions Weight Bearing Restrictions: No      Mobility Bed Mobility Overal bed mobility: Modified Independent                  Transfers Overall transfer level: Needs assistance Equipment used: None Transfers: Sit to/from Stand Sit to Stand: Min guard, Min assist                  Balance Overall balance assessment: Mild deficits observed, not formally tested                                         ADL either performed or assessed with clinical judgement   ADL Overall ADL's : Needs assistance/impaired Eating/Feeding: Set up   Grooming: Min guard   Upper Body Bathing: Minimal assistance   Lower Body Bathing: Minimal assistance   Upper Body Dressing : Min guard   Lower Body Dressing: Min guard   Toilet Transfer: Min guard;Ambulation;Regular Social worker and Hygiene: Min guard       Functional mobility during ADLs: Min guard       Vision Baseline Vision/History: 1 Wears glasses Additional Comments: pt reports she typically reads glasses, difficulty reading paragraph, states she would typically need glasses to read, functional for BADL     Perception Perception Perception Tested?: No   Praxis Praxis Praxis tested?: Not tested    Pertinent Vitals/Pain Pain Assessment Pain Assessment: No/denies pain  Hand Dominance     Extremity/Trunk Assessment Upper Extremity Assessment Upper Extremity Assessment: Generalized weakness (R hand dominant, mild ataxia with movement, undershoots/overshoots with finger to nose)   Lower Extremity Assessment Lower Extremity Assessment: Defer to PT evaluation   Cervical / Trunk Assessment Cervical /  Trunk Assessment: Normal   Communication Communication Communication: No difficulties   Cognition Arousal/Alertness: Awake/alert Behavior During Therapy: WFL for tasks assessed/performed Overall Cognitive Status: History of cognitive impairments - at baseline                                 General Comments: Hx STM deficits at baseline; pt is pleasant and demonstrates fair problem solving and thinking skills throughout evaluation. Following all commands, becomes flustered with cognitive questions, able to count backwards 20-1, states month of year backwards, no recall of 3 words after ~5 mins     General Comments  see BP in note    Exercises     Shoulder Instructions      Home Living Family/patient expects to be discharged to:: Private residence Living Arrangements: Spouse/significant other Available Help at Discharge: Family Type of Home: House Home Access: Stairs to enter Secretary/administrator of Steps: 6 Entrance Stairs-Rails: Right;Left Home Layout: One level     Bathroom Shower/Tub: Producer, television/film/video: Standard     Home Equipment: Agricultural consultant (2 wheels);Cane - single point;Shower seat   Additional Comments: son is present all the time except evenings      Prior Functioning/Environment Prior Level of Function : Needs assist             Mobility Comments: Dynamic imbalance at baseline due to bilateral toe amputations, prefers HHA for community ambulation ADLs Comments: assist for IADL's due to memory loss, wears Depends        OT Problem List: Decreased range of motion;Decreased strength;Decreased activity tolerance;Impaired balance (sitting and/or standing);Impaired vision/perception;Decreased safety awareness;Decreased cognition;Cardiopulmonary status limiting activity      OT Treatment/Interventions: Self-care/ADL training;Therapeutic exercise;Energy conservation;DME and/or AE instruction;Therapeutic  activities;Patient/family education;Balance training;Cognitive remediation/compensation    OT Goals(Current goals can be found in the care plan section) Acute Rehab OT Goals Patient Stated Goal: none stated OT Goal Formulation: With patient Time For Goal Achievement: 07/26/22 Potential to Achieve Goals: Good ADL Goals Pt Will Perform Lower Body Dressing: with modified independence;sit to/from stand;sitting/lateral leans Pt Will Perform Tub/Shower Transfer: Shower transfer;with modified independence;ambulating;shower seat Additional ADL Goal #1: pt will accurately follow 3 step command in prep for ADLs  OT Frequency: Min 1X/week    Co-evaluation              AM-PAC OT "6 Clicks" Daily Activity     Outcome Measure Help from another person eating meals?: None Help from another person taking care of personal grooming?: A Little Help from another person toileting, which includes using toliet, bedpan, or urinal?: A Little Help from another person bathing (including washing, rinsing, drying)?: A Little Help from another person to put on and taking off regular upper body clothing?: A Little Help from another person to put on and taking off regular lower body clothing?: A Little 6 Click Score: 19   End of Session Equipment Utilized During Treatment: Gait belt Nurse Communication: Mobility status;Other (comment) (BP)  Activity Tolerance: Patient tolerated treatment well Patient left: in bed;with call bell/phone within reach;with bed alarm set;with family/visitor present  OT Visit Diagnosis: Unsteadiness on feet (R26.81);Other abnormalities  of gait and mobility (R26.89);Muscle weakness (generalized) (M62.81)                Time: 5284-1324 OT Time Calculation (min): 29 min Charges:  OT General Charges $OT Visit: 1 Visit OT Evaluation $OT Eval Moderate Complexity: 1 Mod OT Treatments $Self Care/Home Management : 8-22 mins  Carver Fila, OTD, OTR/L SecureChat Preferred Acute  Rehab [phone]   Doloras [place] K Koonce 07/12/2022, 1:50 PM

## 2022-07-12 NOTE — Progress Notes (Signed)
A consult was placed for new IV access;  pt is not currently receiving IV medications;  Left arm is restricted; for vein preservation, VAST will wait until IV medication is ordered to place the best line for her therapy. RN and MD made aware. Please enter new consult if needs change.  Thank you.

## 2022-07-12 NOTE — Progress Notes (Addendum)
STROKE TEAM PROGRESS NOTE   INTERVAL HISTORY Her son is at the bedside.  Patient is sitting up in bed, in no acute distress.  Patient and patient's son at bedside states that patient is currently at her baseline neurologic status today.  MRA head complete and negative for LVO, positive for severe right PCA P2 segment stenosis but maintained distal flow signal.  Otherwise mild for age intracranial atherosclerosis.  Carotid duplex completed, results pending  Vitals:   07/11/22 2149 07/12/22 0144 07/12/22 0439 07/12/22 0905  BP: (!) 156/84 (!) 177/62 (!) 171/62 (!) 183/64  Pulse:  67 60 (!) 58  Resp: 18 18 18    Temp:  97.7 F (36.5 C) 97.8 F (36.6 C) 97.6 F (36.4 C)  TempSrc:  Oral Oral Oral  SpO2:  98% 97% 100%  Weight:      Height:       CBC:  Recent Labs  Lab 07/11/22 1106 07/11/22 1114 07/12/22 0422  WBC 4.7  --  5.3  NEUTROABS 2.8  --  2.2  HGB 8.8* 9.5* 9.3*  HCT 28.7* 28.0* 29.7*  MCV 100.3*  --  98.3  PLT 73*  --  71*   Basic Metabolic Panel:  Recent Labs  Lab 07/11/22 1106 07/11/22 1114 07/11/22 1830 07/12/22 0422  NA 136 139  --  137  K 3.3* 3.5  --  3.5  CL 96* 95*  --  99  CO2 32  --   --  28  GLUCOSE 176* 169*  --  117*  BUN 18 21  --  32*  CREATININE 3.16* 3.30*  --  4.09*  CALCIUM 8.1*  --   --  8.5*  MG  --   --  2.2  --   PHOS  --   --   --  2.0*   Lipid Panel:  Recent Labs  Lab 07/12/22 0422  CHOL 101  TRIG 91  HDL 37*  CHOLHDL 2.7  VLDL 18  LDLCALC 46   HgbA1c:  Recent Labs  Lab 07/11/22 1716  HGBA1C 4.8   Urine Drug Screen:  Recent Labs  Lab 07/11/22 1102  LABOPIA NONE DETECTED  COCAINSCRNUR NONE DETECTED  LABBENZ NONE DETECTED  AMPHETMU NONE DETECTED  THCU NONE DETECTED  LABBARB NONE DETECTED    Alcohol Level  Recent Labs  Lab 07/11/22 1106  ETH <10   IMAGING past 24 hours MR ANGIO HEAD WO CONTRAST  Result Date: 07/12/2022 CLINICAL DATA:  72 year old female sudden severe headache. Neurologic deficit. Left  caudate lacunar infarct on brain MRI yesterday. EXAM: MRA HEAD WITHOUT CONTRAST TECHNIQUE: Angiographic images of the Circle of Willis were acquired using MRA technique without intravenous contrast. COMPARISON:  Brain MRI yesterday. FINDINGS: Anterior circulation: Antegrade flow in both ICA siphons. No siphon stenosis. Ophthalmic and posterior communicating artery origins appear normal. Patent carotid termini, MCA and ACA origins. Dominant left ACA A1. Right A1 is diminutive but patent. Anterior communicating artery is within normal limits. Visible ACA branches are within normal limits. Left MCA M1 bifurcates early without stenosis. Right MCA M1 segment and bifurcation appear patent without stenosis. Visible bilateral MCA branches are within normal limits. Posterior circulation: Antegrade flow in the posterior circulation. Distal right vertebral artery appears dominant. No distal vertebral or vertebrobasilar junction stenosis. Normal right PICA origin. Left AICA appears dominant and patent. Patent basilar artery without stenosis. Patent SCA and left PCA origins. Left PCA branches are within normal limits. Fetal type right PCA origin with severe right P2  segment stenosis (series 253, image 4) but maintained distal flow signal. Small left posterior communicating artery is also present. Anatomic variants: Dominant right vertebral artery, fetal right PCA origin, dominant left ACA A1. Other: No evidence of intracranial mass effect, ventriculomegaly. IMPRESSION: 1. Negative for large vessel occlusion. 2. Positive for Severe Right PCA P2 segment stenosis. But maintained distal flow signal. 3. Otherwise mild for age intracranial atherosclerosis. Electronically Signed   By: Odessa Fleming M.D.   On: 07/12/2022 10:18   MR BRAIN WO CONTRAST  Result Date: 07/11/2022 CLINICAL DATA:  Neuro deficit, acute, stroke suspected. Sudden severe headache. EXAM: MRI HEAD WITHOUT CONTRAST TECHNIQUE: Multiplanar, multiecho pulse sequences of  the brain and surrounding structures were obtained without intravenous contrast. COMPARISON:  MR head without contrast scratched at CT head without contrast 07/11/2022. MR head without contrast 02/08/2022 FINDINGS: Brain: Diffusion trace signal hyperintensities are present in the corona radiata bilaterally. These demonstrate T2 shine through on the [facility] maps. Acute punctate infarct is present in the left caudate head. No other acute infarcts are present. Confluent T2 and subcortical FLAIR hyperintensities are moderately advanced for age. Moderate atrophy is also advanced for age. The ventricles are proportionate to the degree of atrophy. No significant extraaxial fluid collection is present. White matter changes extending into the brainstem are stable. Cerebellum is within normal limits. Midline structures are within normal limits. Vascular: Flow is present in the major intracranial arteries. Skull and upper cervical spine: The craniocervical junction is normal. Upper cervical spine is within normal limits. T1 marrow signal is somewhat depressed throughout, likely corresponding to the knee a. No focal lesions are present. Sinuses/Orbits: The paranasal sinuses and mastoid air cells are clear. Bilateral lens replacements are noted. Globes and orbits are otherwise unremarkable. IMPRESSION: 1. Acute punctate infarct of the left caudate head. 2. Diffusion trace signal hyperintensities in the corona radiata bilaterally likely reflect evolving subacute infarcts. 3. Moderate atrophy and white matter disease is moderately advanced for age. This likely reflects the sequela of chronic microvascular ischemia. Electronically Signed   By: Marin Roberts M.D.   On: 07/11/2022 16:24    PHYSICAL EXAM Constitutional: Appears well-developed and well-nourished.  Psych: Affect appropriate to situation, calm and cooperative with exam Eyes: No scleral injection HENT: No OP obstrucion MSK: no joint deformities or  swelling Cardiovascular: Normal rate and regular rhythm.  Respiratory: Effort normal, non-labored breathing GI: Soft.  No distension. There is no tenderness.  Skin: WDI  Neuro: Mental Status: Patient is awake, alert, oriented to self.  She correctly states that she is 72 years old.  When asked the current month, patient states September.  When asked again, patient states I do not know.  Patient states that the current year is 2023.  She is able to state that she is in the hospital and that she has had a stroke. No signs of aphasia or neglect.  Cranial Nerves: II: Visual Fields are full. Pupils are equal, round, and reactive to light.  III,IV, VI: EOMI without ptosis or diploplia.  V: Facial sensation is intact and symmetric to light touch VII: Facial movement is symmetric resting and with movement VIII: Hearing is intact to voice X: Palate elevates symmetrically XI: Shoulder shrug is symmetric. XII: Tongue protrudes midline without atrophy or fasciculations.  Motor: Tone is normal. Bulk is normal. 5/5 strength was present in all four extremities.  No pronator drift. Sensory: Sensation is symmetric to light touch and temperature in the arms and legs. No extinction to DSS  present.  Deep Tendon Reflexes: 1+ and symmetric in the biceps and patellae.  Cerebellar: No ataxia with FNF on the right, ataxia with FNF on the left. Mild ataxia with RAM bilaterally. No definite ataxia with HKS bilaterally.   ASSESSMENT/PLAN Ms. MALEIGH NEDZA is a 72 y.o. female with history of anemia, ESRD on HD, DM 2, diabetic retinopathy, HTN, lacunar infarction, short-term memory loss, vitamin D and B12 deficiencies presenting with new onset headache, dizziness, and weakness with hypotension with improvement s/p fluid bolus.  EMS reported possible extremity weakness with facial droop that had resolved in the ED.  Stroke: Acute punctate left caudate ischemic infarction, trace signal hyperintensities in the corona  radiata bilaterally likely reflect evolving subacute infarcts etiology likely small vessel disease versus possibly cardioembolic with bilateral corona radiata hyperintensities. Code Stroke CT head no acute intracranial findings. Chronic microvascular ischemic change and cerebral volume loss. MRI acute punctate infarct of the left caudate head.  Diffusion trace signal hyperintensities in the corona radiata bilaterally likely reflect evolving subacute infarcts.  Moderate atrophy and white matter disease is moderately advanced for age.  This likely reflects the sequela of chronic microvascular ischemia. MRA negative for LVO, positive for severe right PCA P2 segment stenosis but maintained distal flow signal.  Otherwise mild for age intracranial atherosclerosis. Carotid Doppler <40% 2D Echo 60-70%. Small pericardial effusion present. Defer to primary.  LDL 46 HgbA1c 4.8 VTE prophylaxis -SCDs    Diet   Diet renal with fluid restriction Room service appropriate? Yes; Fluid consistency: Thin   No antithrombotic prior to admission, now on aspirin 81 mg daily and clopidogrel 75 mg daily for 21 days followed by aspirin monotherapy.  Therapy recommendations: No follow-up PT needed, OT evaluation pending. Disposition: Pending  Hypertension Home meds: Carvedilol, hydralazine Stable Permissive hypertension (OK if < 220/120) but gradually normalize in 5-7 days Long-term BP goal normotensive  Hyperlipidemia Home meds: Atorvastatin 40 mg, resumed in hospital LDL 46, goal < 70 Continue statin at discharge  Diabetes type II Controlled Home meds:  None HgbA1c 4.8, goal < 7.0 CBGs Recent Labs    07/11/22 2305 07/12/22 0903 07/12/22 1144  GLUCAP 182* 128* 109*    SSI as needed  Other Stroke Risk Factors Advanced Age >/= 17  Hx stroke/TIA  Other Active Problems Anemia- HgB 9.3  Hospital day # 0  Bailey [name], AGACNP-BC [facility] Pager: [phone]  ATTENDING  ATTESTATION:  DAPT x 3 months. Severe arthrosclerosis disease. DAPT as above.   Neurology will sign off.   Dr. Viviann [name] evaluated pt independently, reviewed imaging, chart, labs. Discussed and formulated plan with the Resident/APP. Changes were made to the note where appropriate. Please see APP/resident note above for details.    Bailey Kilner,MD   To contact Stroke Continuity provider, please refer to [email or web address]. After hours, contact General Neurology

## 2022-07-12 NOTE — Consult Note (Signed)
Renal Service Consult Note [facility]  Bailey [name] 07/12/2022 [name], MD Requesting Physician: Dr. Sol Blazing  Reason for Consult: ESRD pt w/  HPI: The patient is a 72 y.o. year-old w/ PMH as below who presented to ED yesterday for symptoms of gen'd weakness, HA and dizziness. She had had dialysis the day before and felt okay afterwards, although she said over the past few months she has been feeling weaker after dialysis. Then she woke up 5/18 am w/ a headache, didn't respond to tylenol, then started feeling dizzy and weak. EMS was called w/ concern for stroke. BP's were high in the 180s. In ED CT head was negative, labs were unremarkable and VS were stable. Pt was admitted. We are asked to see for ESRD.    Pt seen in room. Has chronic short-term memory loss issues. Dtr -in-law and son at bedside providing most of the history. No active SOB, CP, abd pain or n/v/d. No recent dialysis or access issues.   ROS - n/a   Past Medical History  Past Medical History:  Diagnosis Date   Anemia    patient preference - stopped iron    ARF (acute renal failure) (HCC) 04/2016   dehydration   Chronic kidney disease    stage 5   DDD (degenerative disc disease), lumbar    Depression    Diabetes mellitus    Type II - pt preference - stopped lantus   Diabetic retinopathy (HCC)    Diverticulitis    History of kidney stones    passed- 6   Hypertension    Lacunar infarction (HCC)    Osteomyelitis (HCC)    Polyneuropathy    Vitamin B 12 deficiency 06/09/2017   Vitamin D deficiency    Wears partial dentures    upper   Past Surgical History  Past Surgical History:  Procedure Laterality Date   AMPUTATION Right 08/14/2016   Procedure: RIGHT 1ST RAY AMPUTATION MID-SHAFT;  Surgeon: Nadara Mustard, MD;  Location: [facility] OR;  Service: Orthopedics;  Laterality: Right;   BASCILIC VEIN TRANSPOSITION Left 06/08/2019   Procedure: LEFT BRACHIOCEPHALIC VEIN CREATION;  Surgeon: [name], MD;  Location: [facility] OR;  Service: Vascular;  Laterality: Left;   CATARACT EXTRACTION  2018   INCISION AND DRAINAGE ABSCESS Left 02/11/2014   Procedure: INCISION AND DRAINAGE ABSCESS Left Buttock;  Surgeon: Avel Peace, MD;  Location: [facility] ORS;  Service: General;  Laterality: Left;   INSERTION OF DIALYSIS CATHETER Left 09/07/2019   Procedure: INSERTION OF LEFT INTERNAL JUGULAR DIALYSIS CATHETER;  Surgeon: Larina Earthly, MD;  Location: [facility] OR;  Service: Vascular;  Laterality: Left;   IR REMOVAL TUN CV CATH W/O FL  09/04/2019   LAPAROSCOPIC SMALL BOWEL RESECTION N/A 05/09/2016   Procedure: LAPAROSCOPIC SMALL BOWEL RESECTION;  Surgeon: Karie Soda, MD;  Location: [facility] ORS;  Service: General;  Laterality: N/A;   LAPAROSCOPY N/A 05/09/2016   Procedure: LAPAROSCOPY DIAGNOSTIC, LYSIS OF ADHESIONS, SMALL BOWEL RESECTION X 2;  Surgeon: Karie Soda, MD;  Location: [facility] ORS;  Service: General;  Laterality: N/A;   TOE AMPUTATION     left foot great toe   Family History  Family History  Problem Relation Age of Onset   Diabetes Mother    Asthma Sister    Social History  reports that she has never smoked. She has never used smokeless tobacco. She reports that she does not drink alcohol and does not use drugs. Allergies No Known Allergies Home  medications Prior to Admission medications   Medication Sig Start Date End Date Taking? Authorizing Provider  amLODipine (NORVASC) 5 MG tablet Take 1/2 tablet (2.5 mg total) by mouth at bedtime. 04/30/21  Yes Ghimire, Werner Lean, MD  atorvastatin (LIPITOR) 40 MG tablet Take 1 tablet (40 mg total) by mouth daily. 12/23/21  Yes Ocie Doyne, MD  AURYXIA 1 GM 210 MG(Fe) tablet Take 210 mg by mouth 3 (three) times daily. 02/06/21  Yes [provider]  carvedilol (COREG) 25 MG tablet Take 25 mg by mouth 2 (two) times daily with a meal.   Yes [provider]  donepezil (ARICEPT) 5 MG tablet Take 1/2 pill daily for 4 weeks, then increase to 1  pill daily 12/22/21  Yes Chima, Victorino Dike, MD  hydrALAZINE (APRESOLINE) 25 MG tablet TAKE 1 TABLET(25 MG) BY MOUTH THREE TIMES DAILY Patient taking differently: Take 25 mg by mouth 3 (three) times daily. 12/11/21  Yes Tolia, Sunit, DO  isosorbide dinitrate (ISORDIL) 20 MG tablet TAKE 1 TABLET(20 MG) BY MOUTH THREE TIMES DAILY Patient taking differently: Take 20 mg by mouth 3 (three) times daily. Patient has only been getting once daily. 12/11/21  Yes Tolia, Sunit, DO  acetaminophen (TYLENOL) 500 MG tablet Take 1,000 mg by mouth every 6 (six) hours as needed for fever or headache (pain).     [provider]  multivitamin (RENA-VIT) TABS tablet Take 1 tablet by mouth daily.    [provider]     Vitals:   07/12/22 0144 07/12/22 0439 07/12/22 0905 07/12/22 1243  BP: (!) 177/62 (!) 171/62 (!) 183/64 (!) 184/58  Pulse: 67 60 (!) 58 62  Resp: 18 18  14   Temp: 97.7 F (36.5 C) 97.8 F (36.6 C) 97.6 F (36.4 C) 98.6 F (37 C)  TempSrc: Oral Oral Oral Oral  SpO2: 98% 97% 100% 100%  Weight:      Height:       Exam Gen alert, no distress, pleasant elderly WF No rash, cyanosis or gangrene Sclera anicteric, throat clear  No jvd or bruits Chest clear bilat to bases, no rales/ wheezing RRR no RG Abd soft ntnd no mass or ascites +bs GU defer MS no joint effusions or deformity Ext no LE or UE edema, no wounds or ulcers Neuro is alert, Ox 3 , nf    LUA AVF+bruit     Home meds include - norvasc 2.5 hs, lipitor, auryxia 210 ac, coreg 25 bid, aricept, hydralazine 25 tid, isordil 20 tid, renavite, prns/ vits/ supps     OP HD: SW MWF  3.5h   350/ 500   47.6kg   2/2 bath  LUA AVF  Hep none - last HD 5/17, post wt 48kg - no esa, IV fe or vdra   Head CT neg   MRI brain - acute punctate infarct in L caudate, also subacute infarcts in bilat corona radiatia, likely HTNsive strokes  Assessment/ Plan: Acute CVA - w/ punctate L caudate infarct, also subacute infarcts in bilat  corona radiata. Likely HTNsive strokes. Neurology consulted.  HTN - takes 3 bp lowering meds + isordil at home. Permissive HTN for 48 hrs in place so home meds currently on hold.  ESRD - on HD MWF. Has not missed HD. HD tomorrow.  Volume - up 2.5 kg by wts, no vol excess on exam. UF Anemia esrd - Hb 9-10 range. Last OP Hb was 11.5, not on esa. Follow Hb and if drops further, resume her esa Rx.  MBD ckd - CCa and phos in range. Cont auryxia 1 ac as binder. Not getting vdra or sensipar.  Dementia - poor short term memory DM2 - per pmd      Vinson Moselle  MD [facility] 07/12/2022, 3:38 PM  Recent Labs  Lab 07/11/22 1106 07/11/22 1114 07/12/22 0422  HGB 8.8* 9.5* 9.3*  ALBUMIN 2.8*  --  2.8*  CALCIUM 8.1*  --  8.5*  PHOS  --   --  2.0*  CREATININE 3.16* 3.30* 4.09*  K 3.3* 3.5 3.5   Inpatient medications:  aspirin EC  81 mg Oral Daily   clopidogrel  75 mg Oral Daily   insulin aspart  0-6 Units Subcutaneous TID WC    acetaminophen **OR** acetaminophen (TYLENOL) oral liquid 160 mg/5 mL **OR** acetaminophen

## 2022-07-12 NOTE — Plan of Care (Signed)
  Problem: Education: Goal: Knowledge of disease or condition will improve Outcome: Progressing Goal: Knowledge of secondary prevention will improve (MUST DOCUMENT ALL) Outcome: Progressing Goal: Knowledge of patient specific risk factors will improve [name] N/A or DELETE if not current risk factor) Outcome: Progressing   Problem: Ischemic Stroke/TIA Tissue Perfusion: Goal: Complications of ischemic stroke/TIA will be minimized Outcome: Progressing   Problem: Coping: Goal: Will verbalize positive feelings about self Outcome: Progressing Goal: Will identify appropriate support needs Outcome: Progressing   Problem: Health Behavior/Discharge Planning: Goal: Ability to manage health-related needs will improve Outcome: Progressing Goal: Goals will be collaboratively established with patient/family Outcome: Progressing   Problem: Self-Care: Goal: Ability to participate in self-care as condition permits will improve Outcome: Progressing Goal: Verbalization of feelings and concerns over difficulty with self-care will improve Outcome: Progressing Goal: Ability to communicate needs accurately will improve Outcome: Progressing   Problem: Nutrition: Goal: Risk of aspiration will decrease Outcome: Progressing Goal: Dietary intake will improve Outcome: Progressing   Problem: Education: Goal: Ability to describe self-care measures that may prevent or decrease complications (Diabetes Survival Skills Education) will improve Outcome: Progressing Goal: Individualized Educational Video(s) Outcome: Progressing   Problem: Coping: Goal: Ability to adjust to condition or change in health will improve Outcome: Progressing   Problem: Fluid Volume: Goal: Ability to maintain a balanced intake and output will improve Outcome: Progressing   Problem: Health Behavior/Discharge Planning: Goal: Ability to identify and utilize available resources and services will improve Outcome:  Progressing Goal: Ability to manage health-related needs will improve Outcome: Progressing   Problem: Metabolic: Goal: Ability to maintain appropriate glucose levels will improve Outcome: Progressing   Problem: Nutritional: Goal: Maintenance of adequate nutrition will improve Outcome: Progressing Goal: Progress toward achieving an optimal weight will improve Outcome: Progressing   Problem: Skin Integrity: Goal: Risk for impaired skin integrity will decrease Outcome: Progressing   Problem: Tissue Perfusion: Goal: Adequacy of tissue perfusion will improve Outcome: Progressing   Problem: Education: Goal: Knowledge of General Education information will improve Description: Including pain rating scale, medication(s)/side effects and non-pharmacologic comfort measures Outcome: Progressing   Problem: Health Behavior/Discharge Planning: Goal: Ability to manage health-related needs will improve Outcome: Progressing   Problem: Clinical Measurements: Goal: Ability to maintain clinical measurements within normal limits will improve Outcome: Progressing Goal: Will remain free from infection Outcome: Progressing Goal: Diagnostic test results will improve Outcome: Progressing Goal: Respiratory complications will improve Outcome: Progressing Goal: Cardiovascular complication will be avoided Outcome: Progressing   Problem: Coping: Goal: Level of anxiety will decrease Outcome: Progressing   Problem: Elimination: Goal: Will not experience complications related to bowel motility Outcome: Progressing Goal: Will not experience complications related to urinary retention Outcome: Progressing

## 2022-07-13 ENCOUNTER — Other Ambulatory Visit (HOSPITAL_COMMUNITY): Payer: Self-pay

## 2022-07-13 DIAGNOSIS — I10 Essential (primary) hypertension: Secondary | ICD-10-CM

## 2022-07-13 DIAGNOSIS — E1122 Type 2 diabetes mellitus with diabetic chronic kidney disease: Secondary | ICD-10-CM | POA: Diagnosis not present

## 2022-07-13 DIAGNOSIS — I120 Hypertensive chronic kidney disease with stage 5 chronic kidney disease or end stage renal disease: Secondary | ICD-10-CM | POA: Diagnosis not present

## 2022-07-13 DIAGNOSIS — N186 End stage renal disease: Secondary | ICD-10-CM | POA: Diagnosis not present

## 2022-07-13 DIAGNOSIS — I639 Cerebral infarction, unspecified: Secondary | ICD-10-CM | POA: Diagnosis not present

## 2022-07-13 DIAGNOSIS — Z992 Dependence on renal dialysis: Secondary | ICD-10-CM | POA: Diagnosis not present

## 2022-07-13 LAB — RENAL FUNCTION PANEL
Albumin: 3.1 g/dL — ABNORMAL LOW (ref 3.5–5.0)
Anion gap: 9 (ref 5–15)
BUN: 46 mg/dL — ABNORMAL HIGH (ref 8–23)
CO2: 30 mmol/L (ref 22–32)
Calcium: 9 mg/dL (ref 8.9–10.3)
Chloride: 99 mmol/L (ref 98–111)
Creatinine, Ser: 5.29 mg/dL — ABNORMAL HIGH (ref 0.44–1.00)
GFR, Estimated: 8 mL/min — ABNORMAL LOW (ref >60)
Glucose, Bld: 112 mg/dL — ABNORMAL HIGH (ref 70–99)
Phosphorus: 2.1 mg/dL — ABNORMAL LOW (ref 2.5–4.6)
Potassium: 4 mmol/L (ref 3.5–5.1)
Sodium: 138 mmol/L (ref 135–145)

## 2022-07-13 LAB — MRSA NEXT GEN BY PCR, NASAL: MRSA by PCR Next Gen: NOT DETECTED

## 2022-07-13 LAB — GLUCOSE, CAPILLARY
Glucose-Capillary: 111 mg/dL — ABNORMAL HIGH (ref 70–99)
Glucose-Capillary: 111 mg/dL — ABNORMAL HIGH (ref 70–99)
Glucose-Capillary: 134 mg/dL — ABNORMAL HIGH (ref 70–99)
Glucose-Capillary: 159 mg/dL — ABNORMAL HIGH (ref 70–99)
Glucose-Capillary: 163 mg/dL — ABNORMAL HIGH (ref 70–99)
Glucose-Capillary: 195 mg/dL — ABNORMAL HIGH (ref 70–99)
Glucose-Capillary: 94 mg/dL (ref 70–99)

## 2022-07-13 LAB — CBC
HCT: 34.6 % — ABNORMAL LOW (ref 36.0–46.0)
Hemoglobin: 10.5 g/dL — ABNORMAL LOW (ref 12.0–15.0)
MCH: 30.2 pg (ref 26.0–34.0)
MCHC: 30.3 g/dL (ref 30.0–36.0)
MCV: 99.4 fL (ref 80.0–100.0)
Platelets: 84 10*3/uL — ABNORMAL LOW (ref 150–400)
RBC: 3.48 MIL/uL — ABNORMAL LOW (ref 3.87–5.11)
RDW: 16.1 % — ABNORMAL HIGH (ref 11.5–15.5)
WBC: 4.9 10*3/uL (ref 4.0–10.5)
nRBC: 0 % (ref 0.0–0.2)

## 2022-07-13 MED ORDER — HEPARIN SODIUM (PORCINE) 1000 UNIT/ML DIALYSIS
1000.0000 [IU] | INTRAMUSCULAR | Status: DC | PRN
Start: 2022-07-13 — End: 2022-07-13

## 2022-07-13 MED ORDER — CARVEDILOL 12.5 MG PO TABS: 25.0000 mg | ORAL_TABLET | Freq: Two times a day (BID) | ORAL | Status: DC

## 2022-07-13 MED ORDER — CLOPIDOGREL BISULFATE 75 MG PO TABS
75.0000 mg | ORAL_TABLET | Freq: Every day | ORAL | 0 refills | Status: DC
  Filled 2022-07-13: qty 18, 18d supply, fill #0

## 2022-07-13 MED ORDER — OLMESARTAN MEDOXOMIL 40 MG PO TABS
20.0000 mg | ORAL_TABLET | Freq: Every day | ORAL | 0 refills | Status: DC
  Filled 2022-07-13: qty 15, 30d supply, fill #0

## 2022-07-13 MED ORDER — AMLODIPINE BESYLATE 5 MG PO TABS
5.0000 mg | ORAL_TABLET | Freq: Every day | ORAL | Status: DC
  Administered 2022-07-13 – 2022-07-14 (×2): 5 mg via ORAL
  Filled 2022-07-13 (×2): qty 1

## 2022-07-13 MED ORDER — ASPIRIN 81 MG PO TBEC
81.0000 mg | DELAYED_RELEASE_TABLET | Freq: Every day | ORAL | 0 refills | Status: DC
  Filled 2022-07-13: qty 120, 120d supply, fill #0

## 2022-07-13 MED ORDER — HYDRALAZINE HCL 25 MG PO TABS
25.0000 mg | ORAL_TABLET | Freq: Once | ORAL | Status: AC
  Administered 2022-07-13: 25 mg via ORAL
  Filled 2022-07-13: qty 1

## 2022-07-13 MED ORDER — IRBESARTAN 150 MG PO TABS
150.0000 mg | ORAL_TABLET | Freq: Every day | ORAL | Status: DC
  Administered 2022-07-13 – 2022-07-14 (×2): 150 mg via ORAL
  Filled 2022-07-13 (×2): qty 1

## 2022-07-13 MED ORDER — HEPARIN SODIUM (PORCINE) 1000 UNIT/ML DIALYSIS
3000.0000 [IU] | INTRAMUSCULAR | Status: DC | PRN
Start: 2022-07-13 — End: 2022-07-13

## 2022-07-13 NOTE — TOC Initial Note (Signed)
Transition of Care [facility]) - Initial/Assessment Note    Patient Details  Name: Bailey [name] MRN: [id number] Date of Birth: Oct 12, 1950  Transition of Care [facility]) CM/SW Contact:    Kermit Balo, RN Phone Number: 07/13/2022, 11:22 AM  Clinical Narrative:                 Pt is from home with her spouse. He is with her most of the time. If he is not pt states her son is there.  Spouse and son over see her medications. She denies any issues.  Spouse provides needed transportation.  Recommendations for outpatient therapy. Pt says she attend HD at [facility] and would like to do her outpatient rehab there also. Referral sent and information on the AVS.  Spouse or son will transport home when medically ready.   Expected Discharge Plan: OP Rehab Barriers to Discharge: Continued Medical Work up   Patient Goals and CMS Choice Patient states their goals for this hospitalization and ongoing recovery are:: return home   Choice offered to / list presented to : Patient      Expected Discharge Plan and Services   Discharge Planning Services: CM Consult   Living arrangements for the past 2 months: Single Family Home                                      Prior Living Arrangements/Services Living arrangements for the past 2 months: Single Family Home Lives with:: Spouse Patient language and need for interpreter reviewed:: Yes Do you feel safe going back to the place where you live?: Yes        Care giver support system in place?: Yes (comment) Current home services: DME (walker/ canes/ shower seat/ wheelchair) Criminal Activity/Legal Involvement Pertinent to Current Situation/Hospitalization: No - Comment as needed  Activities of Daily Living Home Assistive Devices/Equipment: None ADL Screening (condition at time of admission) Patient's cognitive ability adequate to safely complete daily activities?: Yes Is the patient deaf or have difficulty hearing?: No Does the patient have  difficulty seeing, even when wearing glasses/contacts?: No Does the patient have difficulty concentrating, remembering, or making decisions?: Yes Patient able to express need for assistance with ADLs?: Yes Does the patient have difficulty dressing or bathing?: Yes Independently performs ADLs?: No Does the patient have difficulty walking or climbing stairs?: Yes Weakness of Legs: Both Weakness of Arms/Hands: None  Permission Sought/Granted                  Emotional Assessment Appearance:: Appears stated age Attitude/Demeanor/Rapport: Engaged Affect (typically observed): Accepting Orientation: : Oriented to Self, Oriented to  Time, Oriented to Place, Oriented to Situation   Psych Involvement: No (comment)  Admission diagnosis:  Acute CVA (cerebrovascular accident) [facility]) [I63.9] Patient Active Problem List   Diagnosis Date Noted   Essential hypertension 07/13/2022   Acute CVA (cerebrovascular accident) (HCC) 07/11/2022   Pyelonephritis 04/27/2021   Malnutrition of moderate degree 09/05/2019   Hypokalemia 09/02/2019   Fever of unknown origin 09/02/2019   ESRD on dialysis (HCC) 08/20/2019   Benign essential HTN 08/20/2019   Symptomatic anemia 08/20/2019   Thrombocytopenia (HCC) 08/20/2019   CKD (chronic kidney disease) 05/23/2019   Type II diabetes mellitus, uncontrolled 12/07/2017   History of anemia due to chronic kidney disease 10/11/2017   Vitamin B 12 deficiency 06/09/2017   Great toe amputation status, right 09/04/2016   Osteomyelitis  of toe of right foot (HCC)    Acute renal failure (ARF) (HCC) 05/10/2016   SBO (small bowel obstruction) (HCC) 05/08/2016   Diverticular disease of jejunum s/p resection 05/09/2016 05/08/2016   Normocytic anemia 05/05/2016   PCP:  Lewis Moccasin, MD Pharmacy:   [facility] [phone] - [place], [place] - [address] [place] [address] Phone: [phone] Fax: [phone]  [facility]  [id number] [place], [place] - [address] AT [facility] OF [facility] & [address] [place] [address] Phone: [phone] Fax: [phone]  [facility] [address] [place] [address] Phone: [phone] Fax: [phone]     Social Determinants of Health (SDOH) Social History: SDOH Screenings   Tobacco Use: Low Risk  (07/11/2022)   SDOH Interventions:     Readmission Risk Interventions     No data to display

## 2022-07-13 NOTE — Progress Notes (Signed)
Chaplain responded to [facility] consult for Advance Directives. Chaplain introduced spiritual care and offered support to pt in the setting of an inpatient hospital admission. Bailey [name] was surprised to learn about the AD consult and stated that she understood it as a document having to do with death. Chaplain offered clarification on the purpose of Advance Directives, to make medical decisions or appoint individuals to voice your medical decisions on the occasion when you are unable to do so for yourself. Bailey [name] is married and has no desire for further education or to complete these documents at this time. She shared that she is hoping to be discharged soon because she feels like she'll be able to get her needs better met outside of the hospital.  Bailey [name]. Carley Hammed, M.Div. [facility] Chaplain Pager [phone] Office [phone]

## 2022-07-13 NOTE — Evaluation (Signed)
Speech Language Pathology Evaluation Patient Details Name: Bailey [name] MRN: [id number] DOB: 04/05/1950 Today's Date: 07/13/2022 Time: 4098-1191 SLP Time Calculation (min) (ACUTE ONLY): 21 min  Problem List:  Patient Active Problem List   Diagnosis Date Noted   Acute CVA (cerebrovascular accident) (HCC) 07/11/2022   Pyelonephritis 04/27/2021   Malnutrition of moderate degree 09/05/2019   Hypokalemia 09/02/2019   Fever of unknown origin 09/02/2019   ESRD on dialysis (HCC) 08/20/2019   Benign essential HTN 08/20/2019   Symptomatic anemia 08/20/2019   Thrombocytopenia (HCC) 08/20/2019   CKD (chronic kidney disease) 05/23/2019   Type II diabetes mellitus, uncontrolled 12/07/2017   History of anemia due to chronic kidney disease 10/11/2017   Vitamin B 12 deficiency 06/09/2017   Great toe amputation status, right 09/04/2016   Osteomyelitis of toe of right foot (HCC)    Acute renal failure (ARF) (HCC) 05/10/2016   SBO (small bowel obstruction) (HCC) 05/08/2016   Diverticular disease of jejunum s/p resection 05/09/2016 05/08/2016   Normocytic anemia 05/05/2016   Past Medical History:  Past Medical History:  Diagnosis Date   Anemia    patient preference - stopped iron    ARF (acute renal failure) (HCC) 04/2016   dehydration   Chronic kidney disease    stage 5   DDD (degenerative disc disease), lumbar    Depression    Diabetes mellitus    Type II - pt preference - stopped lantus   Diabetic retinopathy (HCC)    Diverticulitis    History of kidney stones    passed- 6   Hypertension    Lacunar infarction (HCC)    Osteomyelitis (HCC)    Polyneuropathy    Vitamin B 12 deficiency 06/09/2017   Vitamin D deficiency    Wears partial dentures    upper   Past Surgical History:  Past Surgical History:  Procedure Laterality Date   AMPUTATION Right 08/14/2016   Procedure: RIGHT 1ST RAY AMPUTATION MID-SHAFT;  Surgeon: Nadara Mustard, MD;  Location: [facility] OR;  Service: Orthopedics;   Laterality: Right;   BASCILIC VEIN TRANSPOSITION Left 06/08/2019   Procedure: LEFT BRACHIOCEPHALIC VEIN CREATION;  Surgeon: [name], MD;  Location: [facility] OR;  Service: Vascular;  Laterality: Left;   CATARACT EXTRACTION  2018   INCISION AND DRAINAGE ABSCESS Left 02/11/2014   Procedure: INCISION AND DRAINAGE ABSCESS Left Buttock;  Surgeon: Avel Peace, MD;  Location: [facility] ORS;  Service: General;  Laterality: Left;   INSERTION OF DIALYSIS CATHETER Left 09/07/2019   Procedure: INSERTION OF LEFT INTERNAL JUGULAR DIALYSIS CATHETER;  Surgeon: Larina Earthly, MD;  Location: [facility] OR;  Service: Vascular;  Laterality: Left;   IR REMOVAL TUN CV CATH W/O FL  09/04/2019   LAPAROSCOPIC SMALL BOWEL RESECTION N/A 05/09/2016   Procedure: LAPAROSCOPIC SMALL BOWEL RESECTION;  Surgeon: Karie Soda, MD;  Location: [facility] ORS;  Service: General;  Laterality: N/A;   LAPAROSCOPY N/A 05/09/2016   Procedure: LAPAROSCOPY DIAGNOSTIC, LYSIS OF ADHESIONS, SMALL BOWEL RESECTION X 2;  Surgeon: Karie Soda, MD;  Location: [facility] ORS;  Service: General;  Laterality: N/A;   TOE AMPUTATION     left foot great toe   HPI:  Pt is a 71 y,o. Female who presented 07/11/22 with acute onset of dizziness, headache and weakness. MRI brain 5/18: acute left caudate punctuate stroke. PMH: Bailey [name] suspected by MDs and family, T2DM, thromboyctyopenia, ESRD on MWF HD, HTN, bilateral first toe amputations, reported prior stroke.   Assessment / Plan / Recommendation Clinical Impression  Pt participated in speech-language-cognition evaluation. Pt reported that at baseline she has been having difficulty with memory and being "spacey" or easily distracted, but she denied any baseline difficulty with speech/language. Pt stated that her husband manages her medications and finances. The [facility] Mental Status Examination was completed to evaluate the pt's cognitive-linguistic skills. She achieved a score of 13/30 which is below the normal  limits of 27 or more out of 30. She exhibited deficits in the areas of orientation, memory, attention and executive function as it relates to problem solving and awareness. Pt indicated that she believed her performance today is at baseline and this report was corroborated with pt's spouse and son who were contacted by this SLP via phone. Further acute skilled SLP services are not clinically indicated at this time. Pt and family were educated regarding results and recommendations; all parties verbalized understanding as well as agreement with plan of care.    SLP Assessment  SLP Recommendation/Assessment: Patient does not need any further Speech Lanaguage Pathology Services SLP Visit Diagnosis: Cognitive communication deficit (R41.841)    Recommendations for follow up therapy are one component of a multi-disciplinary discharge planning process, led by the attending physician.  Recommendations may be updated based on patient status, additional functional criteria and insurance authorization.    Follow Up Recommendations  No SLP follow up    Assistance Recommended at Discharge  Intermittent Supervision/Assistance  Functional Status Assessment Patient has not had a recent decline in their functional status  Frequency and Duration           SLP Evaluation Cognition  Overall Cognitive Status: History of cognitive impairments - at baseline Arousal/Alertness: Awake/alert Orientation Level: Oriented to person;Oriented to place;Oriented to situation;Disoriented to time Year: 2024 Month: April Day of Week: Incorrect Attention: Focused;Sustained Focused Attention: Appears intact Sustained Attention: Impaired Sustained Attention Impairment: Verbal complex Memory: Impaired Memory Impairment:  (Immediate: 5/5 wtih repetition x5; delayed: 0/5; with cues: 3/5) Awareness: Impaired Awareness Impairment: Emergent impairment Problem Solving: Impaired Problem Solving Impairment: Verbal complex (Money:  1/3; time: 0/1) Executive Function: Sequencing;Organizing Sequencing: Impaired Sequencing Impairment: Verbal complex (clock: 2/4) Organizing:  (backward digit span: 3/3)       Comprehension  Auditory Comprehension Overall Auditory Comprehension: Appears within functional limits for tasks assessed Yes/No Questions: Within Functional Limits Commands: Within Functional Limits Conversation: Complex    Expression Expression Primary Mode of Expression: Verbal Verbal Expression Overall Verbal Expression: Appears within functional limits for tasks assessed Initiation: No impairment Level of Generative/Spontaneous Verbalization: Conversation Repetition: No impairment Naming: No impairment Pragmatics: No impairment   Oral / Motor  Oral Motor/Sensory Function Overall Oral Motor/Sensory Function: Within functional limits Motor Speech Overall Motor Speech: Appears within functional limits for tasks assessed Respiration: Within functional limits Phonation: Normal Resonance: Within functional limits Articulation: Within functional limitis Intelligibility: Intelligible Motor Planning: Witnin functional limits Motor Speech Errors: Not applicable           Myshawn Chiriboga I. Vear Clock, MS, CCC-SLP Neuro Diagnostic Specialist  Acute Rehabilitation Services Office number: [phone]  Scheryl Marten 07/13/2022, 9:45 AM

## 2022-07-13 NOTE — Progress Notes (Signed)
POST HD TX NOTE  07/13/22 1846  Vitals  Temp 98 F (36.7 C)  Temp Source Oral  BP (!) 175/60  MAP (mmHg) 95  BP Location Right Arm  BP Method Automatic  Patient Position (if appropriate) Lying  Pulse Rate 61  Pulse Rate Source Monitor  ECG Heart Rate 62  Resp 17  Oxygen Therapy  SpO2 99 %  O2 Device Room Air  Pulse Oximetry Type Continuous  During Treatment Monitoring  Intra-Hemodialysis Comments (S)   (post HD tx VS check)  Post Treatment  Dialyzer Clearance Clear  Duration of HD Treatment -hour(s) 3.5 hour(s)  Hemodialysis Intake (mL) 0 mL  Liters Processed 73.5  Fluid Removed (mL) 3000 mL  Tolerated HD Treatment Yes  Post-Hemodialysis Comments (S)  tx completed w/o problem, UF goal met, blood rinsed back, VSS. 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

## 2022-07-13 NOTE — Procedures (Signed)
Patient seen on Hemodialysis. BP (!) 176/68 (BP Location: Right Arm)   Pulse 61   Temp 98.4 F (36.9 C) (Oral)   Resp 17   Ht 5\' 4"  (1.626 m)   Wt 50 kg   LMP  (LMP Unknown)   SpO2 99%   BMI 18.92 kg/m   QB 400, UF goal 2.5L Tolerating treatment without complaints at this time.   Zetta Bills MD [facility]. Office # [phone] Pager # [phone] 3:30 PM

## 2022-07-13 NOTE — Plan of Care (Signed)
  Problem: Education: Goal: Knowledge of disease or condition will improve Outcome: Progressing Goal: Knowledge of secondary prevention will improve (MUST DOCUMENT ALL) Outcome: Progressing Goal: Knowledge of patient specific risk factors will improve (Mark N/A or DELETE if not current risk factor) Outcome: Progressing   Problem: Ischemic Stroke/TIA Tissue Perfusion: Goal: Complications of ischemic stroke/TIA will be minimized Outcome: Progressing   Problem: Coping: Goal: Will verbalize positive feelings about self Outcome: Progressing Goal: Will identify appropriate support needs Outcome: Progressing   Problem: Health Behavior/Discharge Planning: Goal: Ability to manage health-related needs will improve Outcome: Progressing Goal: Goals will be collaboratively established with patient/family Outcome: Progressing   Problem: Self-Care: Goal: Ability to participate in self-care as condition permits will improve Outcome: Progressing Goal: Verbalization of feelings and concerns over difficulty with self-care will improve Outcome: Progressing Goal: Ability to communicate needs accurately will improve Outcome: Progressing   Problem: Nutrition: Goal: Risk of aspiration will decrease Outcome: Progressing Goal: Dietary intake will improve Outcome: Progressing   

## 2022-07-13 NOTE — Progress Notes (Signed)
Patient ID: Bailey [name], female   DOB: 05/02/1950, 72 y.o.   MRN: [id number] [facility] Progress Note   Assessment/ Plan:   1.  Acute punctate left caudate CVA: Seen on MRI 07/11/2022 with diffuse trace signal hyperintensities in the corona radiata bilaterally reflecting subacute infarcts along with sequela of chronic microvascular ischemia.  MRA showed severe right PCA P2 segment stenosis but maintained distal flow and was negative for large vessel occlusion.  Seen by neurology with plans noted for aspirin/Plavix with transition to aspirin only in 21 days. 2. ESRD: Usually gets hemodialysis on MWF schedule and has orders for hemodialysis later today. 3. Anemia: Low hemoglobin/hematocrit noted without any overt blood loss, will follow trend and resume ESA. 4. CKD-MBD: With low phosphorus level noted, not on binder at this time after discontinuation of Auryxia or agents for PTH control. 5. Nutrition: Continue renal diet with protein supplementation 6. Hypertension: Blood pressure significantly elevated, given a single dose of hydralazine with plans to monitor with ultrafiltration at hemodialysis  Subjective:   Reports to have had an uneventful night and denies any complaints at this time.    Objective:   BP (!) 201/66 (BP Location: Right Arm)   Pulse 62   Temp 97.7 F (36.5 C) (Oral)   Resp 19   Ht 5\' 4"  (1.626 m)   Wt 50 kg   LMP  (LMP Unknown)   SpO2 97%   BMI 18.92 kg/m   Physical Exam: Gen: Comfortably resting in bed sitting up to eat breakfast CVS: Pulse regular rhythm, normal rate, S1 and S2 Resp: Clear to auscultation bilaterally, no rales/ Abd: Soft, flat, nontender, bowel sounds normal Ext: Left upper arm AV fistula with positive thrill and bruit  Labs: BMET Recent Labs  Lab 07/11/22 1106 07/11/22 1114 07/12/22 0422  NA 136 139 137  K 3.3* 3.5 3.5  CL 96* 95* 99  CO2 32  --  28  GLUCOSE 176* 169* 117*  BUN 18 21 32*  CREATININE 3.16* 3.30* 4.09*   CALCIUM 8.1*  --  8.5*  PHOS  --   --  2.0*   CBC Recent Labs  Lab 07/11/22 1106 07/11/22 1114 07/12/22 0422  WBC 4.7  --  5.3  NEUTROABS 2.8  --  2.2  HGB 8.8* 9.5* 9.3*  HCT 28.7* 28.0* 29.7*  MCV 100.3*  --  98.3  PLT 73*  --  71*      Medications:     aspirin EC  81 mg Oral Daily   Chlorhexidine Gluconate Cloth  6 each Topical Q0600   clopidogrel  75 mg Oral Daily   insulin aspart  0-6 Units Subcutaneous TID WC   Zetta Bills, MD 07/13/2022, 9:13 AM

## 2022-07-13 NOTE — Progress Notes (Signed)
Subjective:   Summary: Bailey [name] is a 72 y.o. year old female currently admitted on the IMTS HD#0 for stroke.  Overnight Events: NAEON, still hypertensive, on room air.  Neurology recommends DAPT, has signed off.    Patient feeling well this morning. She ate a big breakfast. Nutrition has not seen patient yet. Left arm still feels a little weak and uncoordinated. Patient lives with her husband who is retired, but son comes by frequently. Patient thought it was January 2022. She could recall two of three words with hints. Discussed plan for today and answered all questions.   Finished HD late in evening. I called and talked with Onalee Hua. Son would prefer that patient be discharged tomorrow AM with late time in day.  Objective:  Vital signs in last 24 hours: Vitals:   07/12/22 1800 07/12/22 2010 07/12/22 2355 07/13/22 0414  BP: (!) 191/57 (!) 182/61 (!) 186/53 (!) 190/64  Pulse: 63 62 66 69  Resp:  17 16 16   Temp:  98.4 F (36.9 C) 98 F (36.7 C) 97.9 F (36.6 C)  TempSrc:    Oral  SpO2:  98% 96% 97%  Weight:      Height:       Supplemental O2: Room Air SpO2: 97 %   Physical Exam:  Constitutional: NAD Cardiovascular: RRR, loud systolic murmur right upper sternal border Pulmonary/Chest: normal work of breathing on room air Abdominal: soft, non-tender, non-distended Extremities: warm, well perfused, extremity pulses 2+, no BLE pitting edema.  Left upper extremity fistula with palpable thrill Neuro: Alert and oriented x 3..  Able to repeat 3 objects, but does not remember after 5 minutes.  EOMI, PERRL, cranial nerves grossly intact.  Left upper extremity 4/5 strength, right upper extremity and bilateral lower extremities 5/5 strength.  Ataxia with finger-nose testing on left side.  Filed Weights   07/11/22 1101  Weight: 50 kg     Intake/Output Summary (Last 24 hours) at 07/13/2022 8295 Last data filed at 07/12/2022 1615 Gross per 24 hour  Intake 120 ml   Output --  Net 120 ml   Net IO Since Admission: 120 mL [07/13/22 0623]  Pertinent Labs:    Latest Ref Rng & Units 07/12/2022    4:22 AM 07/11/2022   11:14 AM 07/11/2022   11:06 AM  CBC  WBC 4.0 - 10.5 K/uL 5.3   4.7   Hemoglobin 12.0 - 15.0 g/dL 9.3  9.5  8.8   Hematocrit 36.0 - 46.0 % 29.7  28.0  28.7   Platelets 150 - 400 K/uL 71   73        Latest Ref Rng & Units 07/12/2022    4:22 AM 07/11/2022   11:14 AM 07/11/2022   11:06 AM  CMP  Glucose 70 - 99 mg/dL 621  308  657   BUN 8 - 23 mg/dL 32  21  18   Creatinine 0.44 - 1.00 mg/dL 8.46  9.62  9.52   Sodium 135 - 145 mmol/L 137  139  136   Potassium 3.5 - 5.1 mmol/L 3.5  3.5  3.3   Chloride 98 - 111 mmol/L 99  95  96   CO2 22 - 32 mmol/L 28   32   Calcium 8.9 - 10.3 mg/dL 8.5   8.1   Total Protein 6.5 - 8.1 g/dL   5.4   Total Bilirubin 0.3 -  1.2 mg/dL   0.6   Alkaline Phos 38 - 126 U/L   77   AST 15 - 41 U/L   15   ALT 0 - 44 U/L   9    Imaging: ECHOCARDIOGRAM COMPLETE  Result Date: 07/12/2022    ECHOCARDIOGRAM REPORT   Patient Name:   Bailey [name] [facility] Date of Exam: 07/12/2022 Medical Rec #:  [id number]      Height:       64.0 in Accession #:    [id number]     Weight:       110.2 lb Date of Birth:  Aug 09, 1950      BSA:          1.519 m Patient Age:    71 years       BP:           184/58 mmHg Patient Gender: F              HR:           67 bpm. Exam Location:  Inpatient Procedure: 2D Echo, Cardiac Doppler and Color Doppler Indications:    Stroke I63.9  History:        Patient has prior history of Echocardiogram examinations, most                 recent 12/12/2021. Stroke; Risk Factors:Dyslipidemia, Diabetes,                 Hypertension and Non-Smoker.  Sonographer:    Dondra Prader RVT RCS Referring Phys: [id number] GRACE LAU IMPRESSIONS  1. Left ventricular ejection fraction, by estimation, is 65 to 70%. The left ventricle has normal function. The left ventricle has no regional wall motion abnormalities. There is moderate concentric left  ventricular hypertrophy. Left ventricular diastolic function could not be evaluated.  2. Right ventricular systolic function is normal. The right ventricular size is normal. There is normal pulmonary artery systolic pressure. The estimated right ventricular systolic pressure is 24.6 mmHg.  3. Left atrial size was severely dilated.  4. Right atrial size was mildly dilated.  5. A small pericardial effusion is present. The pericardial effusion is circumferential.  6. The mitral valve is degenerative. Mild mitral valve regurgitation. Severe mitral annular calcification.  7. The aortic valve is tricuspid. Aortic valve regurgitation is not visualized. Moderate aortic valve stenosis. Aortic valve area, by VTI measures 1.01 cm. Aortic valve mean gradient measures 23.9 mmHg. Aortic valve Vmax measures 3.25 m/s.  8. The inferior vena cava is normal in size with <50% respiratory variability, suggesting right atrial pressure of 8 mmHg. Comparison(s): No significant change from prior study. Conclusion(s)/Recommendation(s): No intracardiac source of embolism detected on this transthoracic study. Consider a transesophageal echocardiogram to exclude cardiac source of embolism if clinically indicated. FINDINGS  Left Ventricle: Left ventricular ejection fraction, by estimation, is 65 to 70%. The left ventricle has normal function. The left ventricle has no regional wall motion abnormalities. The left ventricular internal cavity size was normal in size. There is  moderate concentric left ventricular hypertrophy. Left ventricular diastolic function could not be evaluated due to mitral annular calcification (moderate or greater). Left ventricular diastolic function could not be evaluated. Right Ventricle: The right ventricular size is normal. No increase in right ventricular wall thickness. Right ventricular systolic function is normal. There is normal pulmonary artery systolic pressure. The tricuspid regurgitant velocity is 2.04 m/s,  and  with an assumed right atrial pressure of 8 mmHg, the estimated right ventricular systolic  pressure is 24.6 mmHg. Left Atrium: Left atrial size was severely dilated. Right Atrium: Right atrial size was mildly dilated. Prominent Eustachian valve. Pericardium: A small pericardial effusion is present. The pericardial effusion is circumferential. Mitral Valve: The mitral valve is degenerative in appearance. Severe mitral annular calcification. Mild mitral valve regurgitation. Tricuspid Valve: The tricuspid valve is grossly normal. Tricuspid valve regurgitation is mild . No evidence of tricuspid stenosis. Aortic Valve: The aortic valve is tricuspid. Aortic valve regurgitation is not visualized. Moderate aortic stenosis is present. Aortic valve mean gradient measures 23.9 mmHg. Aortic valve peak gradient measures 42.1 mmHg. Aortic valve area, by VTI measures 1.01 cm. Pulmonic Valve: The pulmonic valve was grossly normal. Pulmonic valve regurgitation is trivial. No evidence of pulmonic stenosis. Aorta: The aortic root and ascending aorta are structurally normal, with no evidence of dilitation. Venous: The inferior vena cava is normal in size with less than 50% respiratory variability, suggesting right atrial pressure of 8 mmHg. IAS/Shunts: The atrial septum is grossly normal.  LEFT VENTRICLE PLAX 2D LVIDd:         4.80 cm   Diastology LVIDs:         3.20 cm   LV e' medial:    2.98 cm/s LV PW:         2.40 cm   LV E/e' medial:  57.7 LV IVS:        1.50 cm   LV e' lateral:   6.25 cm/s LVOT diam:     2.18 cm   LV E/e' lateral: 27.5 LV SV:         86 LV SV Index:   57 LVOT Area:     3.73 cm  RIGHT VENTRICLE             IVC RV Basal diam:  3.20 cm     IVC diam: 1.90 cm RV Mid diam:    2.30 cm RV S prime:     16.40 cm/s TAPSE (M-mode): 2.5 cm LEFT ATRIUM              Index        RIGHT ATRIUM           Index LA diam:        4.30 cm  2.83 cm/m   RA Area:     19.40 cm LA Vol (A2C):   102.0 ml 67.16 ml/m  RA Volume:    56.60 ml  37.27 ml/m LA Vol (A4C):   112.0 ml 73.74 ml/m LA Biplane Vol: 110.0 ml 72.43 ml/m  AORTIC VALVE                     PULMONIC VALVE AV Area (Vmax):    0.90 cm      PV Vmax:       0.85 m/s AV Area (Vmean):   1.04 cm      PV Peak grad:  2.9 mmHg AV Area (VTI):     1.01 cm AV Vmax:           324.58 cm/s AV Vmean:          204.511 cm/s AV VTI:            0.850 m AV Peak Grad:      42.1 mmHg AV Mean Grad:      23.9 mmHg LVOT Vmax:         78.30 cm/s LVOT Vmean:        56.734 cm/s LVOT VTI:  0.230 m LVOT/AV VTI ratio: 0.27  AORTA Ao Root diam: 3.20 cm Ao Asc diam:  3.40 cm MITRAL VALVE                TRICUSPID VALVE MV Area (PHT): 1.96 cm     TR Peak grad:   16.6 mmHg MV Decel Time: 387 msec     TR Vmax:        204.00 cm/s MV E velocity: 172.00 cm/s MV A velocity: 105.00 cm/s  SHUNTS MV E/A ratio:  1.64         Systemic VTI:  0.23 m                             Systemic Diam: 2.18 cm Lennie Odor MD Electronically signed by Lennie Odor MD Signature Date/Time: 07/12/2022/3:19:17 PM    Final    VAS US CAROTID  Result Date: 07/12/2022 Carotid Arterial Duplex Study Patient Name:  Bailey [name] [facility]  Date of Exam:   07/12/2022 Medical Rec #: [id number]       Accession #:    [id number] Date of Birth: [birth year]       Patient Gender: F Patient Age:   61 years Exam Location:  [facility] Procedure:      VAS US CAROTID Referring Phys: Lanae Boast --------------------------------------------------------------------------------  Indications:       CVA. Risk Factors:      Hypertension, Diabetes. Comparison Study:  No prior studies. Performing Technologist: Jean Rosenthal RDMS, RVT  Examination Guidelines: A complete evaluation includes B-mode imaging, spectral Doppler, color Doppler, and power Doppler as needed of all accessible portions of each vessel. Bilateral testing is considered an integral part of a complete examination. Limited examinations for reoccurring indications may be performed as noted.   Right Carotid Findings: +----------+--------+--------+--------+------------------+--------+           PSV cm/sEDV cm/sStenosisPlaque DescriptionComments +----------+--------+--------+--------+------------------+--------+ CCA Prox  53      7                                          +----------+--------+--------+--------+------------------+--------+ CCA Distal44      5                                          +----------+--------+--------+--------+------------------+--------+ ICA Prox  53      6               hyperechoic                +----------+--------+--------+--------+------------------+--------+ ICA Distal121     22                                         +----------+--------+--------+--------+------------------+--------+ ECA       58                                                 +----------+--------+--------+--------+------------------+--------+ +----------+--------+-------+----------------+-------------------+           PSV cm/sEDV cmsDescribe  Arm Pressure (mmHG) +----------+--------+-------+----------------+-------------------+ [id number]            Multiphasic, WNL                    +----------+--------+-------+----------------+-------------------+ +---------+--------+--+--------+--+---------+ VertebralPSV cm/s61EDV cm/s11Antegrade +---------+--------+--+--------+--+---------+  Left Carotid Findings: +----------+--------+--------+--------+------------------+--------+           PSV cm/sEDV cm/sStenosisPlaque DescriptionComments +----------+--------+--------+--------+------------------+--------+ CCA Prox  80      6                                          +----------+--------+--------+--------+------------------+--------+ CCA Distal45      7                                          +----------+--------+--------+--------+------------------+--------+ ICA Prox  54      9               hyperechoic                 +----------+--------+--------+--------+------------------+--------+ ICA Distal125     16                                         +----------+--------+--------+--------+------------------+--------+ ECA       62                                                 +----------+--------+--------+--------+------------------+--------+ +----------+--------+--------+----------------+-------------------+           PSV cm/sEDV cm/sDescribe        Arm Pressure (mmHG) +----------+--------+--------+----------------+-------------------+ Subclavian271     102     Multiphasic, WNL                    +----------+--------+--------+----------------+-------------------+ +---------+--------+--+--------+-+---------+ VertebralPSV cm/s36EDV cm/s6Antegrade +---------+--------+--+--------+-+---------+   Summary: Right Carotid: Velocities in the right ICA are consistent with a 1-39% stenosis. Left Carotid: Velocities in the left ICA are consistent with a 1-39% stenosis. Vertebrals:  Bilateral vertebral arteries demonstrate antegrade flow. Subclavians: Normal flow hemodynamics were seen in bilateral subclavian              arteries. *See table(s) above for measurements and observations.     Preliminary    MR ANGIO HEAD WO CONTRAST  Result Date: 07/12/2022 CLINICAL DATA:  72 year old female sudden severe headache. Neurologic deficit. Left caudate lacunar infarct on brain MRI yesterday. EXAM: MRA HEAD WITHOUT CONTRAST TECHNIQUE: Angiographic images of the Circle of Willis were acquired using MRA technique without intravenous contrast. COMPARISON:  Brain MRI yesterday. FINDINGS: Anterior circulation: Antegrade flow in both ICA siphons. No siphon stenosis. Ophthalmic and posterior communicating artery origins appear normal. Patent carotid termini, MCA and ACA origins. Dominant left ACA A1. Right A1 is diminutive but patent. Anterior communicating artery is within normal limits. Visible ACA branches are within normal  limits. Left MCA M1 bifurcates early without stenosis. Right MCA M1 segment and bifurcation appear patent without stenosis. Visible bilateral MCA branches are within normal limits. Posterior circulation: Antegrade flow in the posterior circulation. Distal right vertebral artery appears dominant. No distal vertebral or vertebrobasilar junction stenosis.  Normal right PICA origin. Left AICA appears dominant and patent. Patent basilar artery without stenosis. Patent SCA and left PCA origins. Left PCA branches are within normal limits. Fetal type right PCA origin with severe right P2 segment stenosis (series 253, image 4) but maintained distal flow signal. Small left posterior communicating artery is also present. Anatomic variants: Dominant right vertebral artery, fetal right PCA origin, dominant left ACA A1. Other: No evidence of intracranial mass effect, ventriculomegaly. IMPRESSION: 1. Negative for large vessel occlusion. 2. Positive for Severe Right PCA P2 segment stenosis. But maintained distal flow signal. 3. Otherwise mild for age intracranial atherosclerosis. Electronically Signed   By: Odessa Fleming M.D.   On: 07/12/2022 10:18     EKG: My EKG interpretation is as follows: personally reviewed my interpretation is sinus bradycardia, borderline QTc, probable LVH.    Assessment/Plan:   Principal Problem:   Acute CVA (cerebrovascular accident) [facility])   Patient Summary: Bailey [name] is a 72 y.o. female with PMH T2DM, thrombocytopenia, ESRD on MWF HD, malnutrition, HTN, reported prior stroke who presented with dizziness, headache, weakness and admitted for acute punctate left caudate infarct.   #acute punctate left caudate infarct No further acute symptoms or changes.  She feels like her symptoms are improving.  Exam is slightly better today, but she is still weak in left upper extremity and has some ataxia as well.  Memory is still poor, she is unable to remember 3 objects after 5 minutes despite  prompting.  Unsure how much of this is chronic, she has pending outpatient workup for possible vascular dementia.   - aspirin 81, Plavix 75 for 3 weeks and then aspirin monotherapy - Hypertension control as below - LDL 46.  Continue Lipitor 40. - A1c 4.8 - neurology signed out   # ESRD on MWF HD #Hypokalemia Finished HD late in evening. Son would prefer that patient be discharged tomorrow AM with late time in day.   #HTN Medications include   # Hx T2DM A1c 4.8, in goal range   #HLD LDL in goal range, continue Lipitor 40   #Malnutrition - Consult nutrition for management   #Thrombocytopenia Has history of thrombocytopenia of unknown etiology.  No active bleeding.  Medically stable for discharge.   Diet: Renal IVF: None,None VTE:  Holding in setting of thrombocytopenia, SCDs Code: Full PT/OT recs: Pending  Dispo: Anticipated discharge tomorrow  Memory Dance. Melita Villalona, D.O.  Internal Medicine Resident, PGY-2 [facility]  Pager: [phone] 7:26 PM, 07/13/2022   **Please contact the on call pager after 5 pm and on weekends at [phone].**

## 2022-07-13 NOTE — Progress Notes (Signed)
Patient returned to room from HD. Report received by Clarisa Fling, RN.

## 2022-07-13 NOTE — Discharge Summary (Signed)
Name: Bailey [name] MRN: [id number] DOB: 13-May-1950 72 y.o. PCP: Lewis Moccasin, MD  Date of Admission: 07/11/2022 10:41 AM Date of Discharge: 07/14/2022 Attending Physician: Dr. Cleda Daub  Discharge Diagnosis: Principal Problem:   Acute CVA (cerebrovascular accident) [facility]) Active Problems:   Essential hypertension    Discharge Medications: Allergies as of 07/14/2022   No Known Allergies      Medication List     STOP taking these medications    acetaminophen 500 MG tablet Commonly known as: TYLENOL   carvedilol 25 MG tablet Commonly known as: COREG   hydrALAZINE 25 MG tablet Commonly known as: APRESOLINE   isosorbide dinitrate 20 MG tablet Commonly known as: ISORDIL       TAKE these medications    amLODipine 5 MG tablet Commonly known as: NORVASC Take 1 tablet (5 mg total) by mouth at bedtime. What changed: how much to take   aspirin EC 81 MG tablet Take 1 tablet (81 mg total) by mouth daily. Swallow whole.   atorvastatin 40 MG tablet Commonly known as: Lipitor Take 1 tablet (40 mg total) by mouth daily.   Auryxia 1 GM 210 MG(Fe) tablet Generic drug: ferric citrate Take 210 mg by mouth 3 (three) times daily.   clopidogrel 75 MG tablet Commonly known as: PLAVIX Take 1 tablet (75 mg total) by mouth daily.   donepezil 5 MG tablet Commonly known as: ARICEPT Take 1/2 pill daily for 4 weeks, then increase to 1 pill daily   multivitamin Tabs tablet Take 1 tablet by mouth daily.   olmesartan 40 MG tablet Commonly known as: BENICAR Take 0.5 tablets (20 mg total) by mouth daily.        Disposition and follow-up:   Bailey [name] was discharged from [facility] in Stable condition.  At the hospital follow up visit please address:  1.  Follow-up:  a.  Will be discharged on DAPT.  Continue for 17 days following discharge and then transition to aspirin monotherapy.  She will need to continue taking Lipitor 40.    b.  Per  our nephrology team, antihypertensive regimen at discharge is amlodipine 5 mg nightly, olmesartan 20 mg daily.  Please work with nephrology to titrate as needed.   c.  She will be discharged with home health PT and OT.  She might also benefit from home health aide.   d.  Patient and family are asking about advanced directives during this hospital stay.  No changes in CODE STATUS or advanced directive so far, but they might benefit from further discussion and possibly palliative referral in the outpatient setting.   E.  Please continue monitoring thrombocytopenia  2.  Labs / imaging needed at time of follow-up: CBC, renal function panel  3.  Pending labs/ test needing follow-up: CBC   Follow-up Appointments:  Follow-up Information     Lewis Moccasin, MD. Schedule an appointment as soon as possible for a visit in 1 week(s).   Specialty: Family Medicine Why: Please make an appointment in 1 to 2 weeks Contact information: [address] Ginette Otto [facility] [address] [phone]         [facility]. Schedule an appointment as soon as possible for a visit in 2 month(s).   Specialty: Neurology Contact information: [address] [place] [address] [phone]        [facility] Follow up.   Why: [phone] The home health agency will contact  you for the first home visit.                Hospital Course by problem list: Bailey [name] is a 72 y.o. female with PMH T2DM, thrombocytopenia, ESRD on MWF HD, malnutrition, HTN, reported prior stroke who presented with dizziness, headache, weakness and admitted for acute punctate left caudate infarct     #acute punctate left caudate infarct #Hx T2DM #HLD Patient presents with acute onset dizziness, headache, weakness which has since resolved.  In the ED, she was found to be hypertensive and MRI revealed acute punctate infarct in the left caudate head as well as  subacute infarcts in the bilateral corona radiata reflecting what are likely hypertensive strokes. Of note, she is prescribed donepezil. I believe she has been undergoing outpatient vascular dementia workup, but unsure if this has been formally diagnosed.  Her primary neurological deficits on exam initially were left upper extremity weakness with baseline of chronic diffuse weakness and left upper extremity ataxia which are also likely chronic in nature due to malnutrition and ESRD.  She also has short-term memory loss and failed multiple MMSE's.  She was initially loaded with DAPT and then started on ASA 81 and Plavix 75 which she will continue for 3 weeks and then transition aspirin monotherapy.  LDL is 46 on her current Lipitor 40 which she will continue.  Cardiac monitoring did not reveal any significant arrhythmias.  A1c is 4.8 which is at goal.  Hypertensive management as below.  Echo revealed EF 65 to 70%, concentric LVH, biatrial dilation, degenerative mitral valve with mild MR, moderate AS.  In general likely consistent with diastolic dysfunction, though without significant volume overload on exam do not think this needs to be treated.  No thrombosis identified and no PFO, therefore no clear indication for anticoagulation.  She was at her baseline neurological status on day of discharge and she will be discharged with home health PT and OT.  # ESRD on MWF HD #Hypokalemia Appreciate nephrology assistance with routine dialysis and antihypertensive regimen titration   #HTN Antihypertensives held in the setting of permissive hypertension for 48 hours.  Reinitiated afterwards.  Nephrology recommended discharge with amlodipine 5, olmesartan 20.   #Malnutrition Nutritionist consulted for assessment. Recommend further outpatient assessment. Patient would benefit from protein supplementation with ensures.   #Thrombocytopenia Has history of thrombocytopenia of unknown etiology.  No active bleeding.  Need  to watch in setting of initiating DAPT.  Smear was negative for any remarkable morphology.  Platelets uptrending on day of discharge.  Subjective: Patient feeling well this morning. Doesn't remember HD yesterday. Left arm feels better. She is amenable to discharge today.  Discussed plan and medication changes with her son Gracelin Winkels and answered all questions.  Discharge Vitals:   BP (!) 149/59 (BP Location: Left Arm)   Pulse 60   Temp (!) 97.4 F (36.3 C) (Oral)   Resp 18   Ht 5\' 4"  (1.626 m)   Wt 45.2 kg   LMP  (LMP Unknown)   SpO2 97%   BMI 17.10 kg/m  Discharge exam: Constitutional: NAD Cardiovascular: RRR, loud systolic murmur right upper sternal border Pulmonary/Chest: normal work of breathing on room air Abdominal: soft, non-tender, non-distended Extremities: warm, well perfused, extremity pulses 2+, no BLE pitting edema.  Left upper extremity fistula with palpable thrill Neuro: Alert and oriented x 3..  Able to repeat 3 objects, but does not remember after 5 minutes.  EOMI, PERRL, cranial nerves grossly intact.  Left upper extremity 4/5 strength, right upper extremity and bilateral lower extremities 5/5 strength.  Ataxia with finger-nose testing on left side.   Pertinent Labs, Studies, and Procedures:     Latest Ref Rng & Units 07/14/2022    6:35 AM 07/13/2022    8:32 AM 07/12/2022    4:22 AM  CBC  WBC 4.0 - 10.5 K/uL 6.0  4.9  5.3   Hemoglobin 12.0 - 15.0 g/dL 09.8  11.9  9.3   Hematocrit 36.0 - 46.0 % 34.1  34.6  29.7   Platelets 150 - 400 K/uL 95  84  71        Latest Ref Rng & Units 07/14/2022    6:35 AM 07/13/2022    8:32 AM 07/12/2022    4:22 AM  CMP  Glucose 70 - 99 mg/dL 147  829  562   BUN 8 - 23 mg/dL 29  46  32   Creatinine 0.44 - 1.00 mg/dL 1.30  8.65  7.84   Sodium 135 - 145 mmol/L 137  138  137   Potassium 3.5 - 5.1 mmol/L 3.9  4.0  3.5   Chloride 98 - 111 mmol/L 99  99  99   CO2 22 - 32 mmol/L 28  30  28    Calcium 8.9 - 10.3 mg/dL 8.9  9.0  8.5      VAS US CAROTID  Result Date: 07/13/2022 Carotid Arterial Duplex Study Patient Name:  Bailey [name] [facility]  Date of Exam:   07/12/2022 Medical Rec #: [id number]       Accession #:    [id number] Date of Birth: [birth year]       Patient Gender: F Patient Age:   52 years Exam Location:  [facility] Procedure:      VAS US CAROTID Referring Phys: Lanae Boast --------------------------------------------------------------------------------  Indications:       CVA. Risk Factors:      Hypertension, Diabetes. Comparison Study:  No prior studies. Performing Technologist: Jean Rosenthal RDMS, RVT  Examination Guidelines: A complete evaluation includes B-mode imaging, spectral Doppler, color Doppler, and power Doppler as needed of all accessible portions of each vessel. Bilateral testing is considered an integral part of a complete examination. Limited examinations for reoccurring indications may be performed as noted.  Right Carotid Findings: +----------+--------+--------+--------+------------------+--------+           PSV cm/sEDV cm/sStenosisPlaque DescriptionComments +----------+--------+--------+--------+------------------+--------+ CCA Prox  53      7                                          +----------+--------+--------+--------+------------------+--------+ CCA Distal44      5                                          +----------+--------+--------+--------+------------------+--------+ ICA Prox  53      6               hyperechoic                +----------+--------+--------+--------+------------------+--------+ ICA Distal121     22                                         +----------+--------+--------+--------+------------------+--------+  ECA       58                                                 +----------+--------+--------+--------+------------------+--------+ +----------+--------+-------+----------------+-------------------+           PSV cm/sEDV cmsDescribe         Arm Pressure (mmHG) +----------+--------+-------+----------------+-------------------+ [id number]            Multiphasic, WNL                    +----------+--------+-------+----------------+-------------------+ +---------+--------+--+--------+--+---------+ VertebralPSV cm/s61EDV cm/s11Antegrade +---------+--------+--+--------+--+---------+  Left Carotid Findings: +----------+--------+--------+--------+------------------+--------+           PSV cm/sEDV cm/sStenosisPlaque DescriptionComments +----------+--------+--------+--------+------------------+--------+ CCA Prox  80      6                                          +----------+--------+--------+--------+------------------+--------+ CCA Distal45      7                                          +----------+--------+--------+--------+------------------+--------+ ICA Prox  54      9               hyperechoic                +----------+--------+--------+--------+------------------+--------+ ICA Distal125     16                                         +----------+--------+--------+--------+------------------+--------+ ECA       62                                                 +----------+--------+--------+--------+------------------+--------+ +----------+--------+--------+----------------+-------------------+           PSV cm/sEDV cm/sDescribe        Arm Pressure (mmHG) +----------+--------+--------+----------------+-------------------+ Subclavian271     102     Multiphasic, WNL                    +----------+--------+--------+----------------+-------------------+ +---------+--------+--+--------+-+---------+ VertebralPSV cm/s36EDV cm/s6Antegrade +---------+--------+--+--------+-+---------+   Summary: Right Carotid: Velocities in the right ICA are consistent with a 1-39% stenosis. Left Carotid: Velocities in the left ICA are consistent with a 1-39% stenosis. Vertebrals:  Bilateral vertebral  arteries demonstrate antegrade flow. Subclavians: Normal flow hemodynamics were seen in bilateral subclavian              arteries. *See table(s) above for measurements and observations.  Electronically signed by Delia Heady MD on 07/13/2022 at 1:08:01 PM.    Final    ECHOCARDIOGRAM COMPLETE  Result Date: 07/12/2022    ECHOCARDIOGRAM REPORT   Patient Name:   Bailey [name] [facility] Date of Exam: 07/12/2022 Medical Rec #:  [id number]      Height:       64.0 in Accession #:    [id number]     Weight:       110.2  lb Date of Birth:  Mar 17, 1950      BSA:          1.519 m Patient Age:    71 years       BP:           184/58 mmHg Patient Gender: F              HR:           67 bpm. Exam Location:  Inpatient Procedure: 2D Echo, Cardiac Doppler and Color Doppler Indications:    Stroke I63.9  History:        Patient has prior history of Echocardiogram examinations, most                 recent 12/12/2021. Stroke; Risk Factors:Dyslipidemia, Diabetes,                 Hypertension and Non-Smoker.  Sonographer:    Dondra Prader RVT RCS Referring Phys: [id number] GRACE LAU IMPRESSIONS  1. Left ventricular ejection fraction, by estimation, is 65 to 70%. The left ventricle has normal function. The left ventricle has no regional wall motion abnormalities. There is moderate concentric left ventricular hypertrophy. Left ventricular diastolic function could not be evaluated.  2. Right ventricular systolic function is normal. The right ventricular size is normal. There is normal pulmonary artery systolic pressure. The estimated right ventricular systolic pressure is 24.6 mmHg.  3. Left atrial size was severely dilated.  4. Right atrial size was mildly dilated.  5. A small pericardial effusion is present. The pericardial effusion is circumferential.  6. The mitral valve is degenerative. Mild mitral valve regurgitation. Severe mitral annular calcification.  7. The aortic valve is tricuspid. Aortic valve regurgitation is not visualized. Moderate aortic  valve stenosis. Aortic valve area, by VTI measures 1.01 cm. Aortic valve mean gradient measures 23.9 mmHg. Aortic valve Vmax measures 3.25 m/s.  8. The inferior vena cava is normal in size with <50% respiratory variability, suggesting right atrial pressure of 8 mmHg. Comparison(s): No significant change from prior study. Conclusion(s)/Recommendation(s): No intracardiac source of embolism detected on this transthoracic study. Consider a transesophageal echocardiogram to exclude cardiac source of embolism if clinically indicated. FINDINGS  Left Ventricle: Left ventricular ejection fraction, by estimation, is 65 to 70%. The left ventricle has normal function. The left ventricle has no regional wall motion abnormalities. The left ventricular internal cavity size was normal in size. There is  moderate concentric left ventricular hypertrophy. Left ventricular diastolic function could not be evaluated due to mitral annular calcification (moderate or greater). Left ventricular diastolic function could not be evaluated. Right Ventricle: The right ventricular size is normal. No increase in right ventricular wall thickness. Right ventricular systolic function is normal. There is normal pulmonary artery systolic pressure. The tricuspid regurgitant velocity is 2.04 m/s, and  with an assumed right atrial pressure of 8 mmHg, the estimated right ventricular systolic pressure is 24.6 mmHg. Left Atrium: Left atrial size was severely dilated. Right Atrium: Right atrial size was mildly dilated. Prominent Eustachian valve. Pericardium: A small pericardial effusion is present. The pericardial effusion is circumferential. Mitral Valve: The mitral valve is degenerative in appearance. Severe mitral annular calcification. Mild mitral valve regurgitation. Tricuspid Valve: The tricuspid valve is grossly normal. Tricuspid valve regurgitation is mild . No evidence of tricuspid stenosis. Aortic Valve: The aortic valve is tricuspid. Aortic valve  regurgitation is not visualized. Moderate aortic stenosis is present. Aortic valve mean gradient measures 23.9 mmHg. Aortic valve peak gradient measures  42.1 mmHg. Aortic valve area, by VTI measures 1.01 cm. Pulmonic Valve: The pulmonic valve was grossly normal. Pulmonic valve regurgitation is trivial. No evidence of pulmonic stenosis. Aorta: The aortic root and ascending aorta are structurally normal, with no evidence of dilitation. Venous: The inferior vena cava is normal in size with less than 50% respiratory variability, suggesting right atrial pressure of 8 mmHg. IAS/Shunts: The atrial septum is grossly normal.  LEFT VENTRICLE PLAX 2D LVIDd:         4.80 cm   Diastology LVIDs:         3.20 cm   LV e' medial:    2.98 cm/s LV PW:         2.40 cm   LV E/e' medial:  57.7 LV IVS:        1.50 cm   LV e' lateral:   6.25 cm/s LVOT diam:     2.18 cm   LV E/e' lateral: 27.5 LV SV:         86 LV SV Index:   57 LVOT Area:     3.73 cm  RIGHT VENTRICLE             IVC RV Basal diam:  3.20 cm     IVC diam: 1.90 cm RV Mid diam:    2.30 cm RV S prime:     16.40 cm/s TAPSE (M-mode): 2.5 cm LEFT ATRIUM              Index        RIGHT ATRIUM           Index LA diam:        4.30 cm  2.83 cm/m   RA Area:     19.40 cm LA Vol (A2C):   102.0 ml 67.16 ml/m  RA Volume:   56.60 ml  37.27 ml/m LA Vol (A4C):   112.0 ml 73.74 ml/m LA Biplane Vol: 110.0 ml 72.43 ml/m  AORTIC VALVE                     PULMONIC VALVE AV Area (Vmax):    0.90 cm      PV Vmax:       0.85 m/s AV Area (Vmean):   1.04 cm      PV Peak grad:  2.9 mmHg AV Area (VTI):     1.01 cm AV Vmax:           324.58 cm/s AV Vmean:          204.511 cm/s AV VTI:            0.850 m AV Peak Grad:      42.1 mmHg AV Mean Grad:      23.9 mmHg LVOT Vmax:         78.30 cm/s LVOT Vmean:        56.734 cm/s LVOT VTI:          0.230 m LVOT/AV VTI ratio: 0.27  AORTA Ao Root diam: 3.20 cm Ao Asc diam:  3.40 cm MITRAL VALVE                TRICUSPID VALVE MV Area (PHT): 1.96 cm     TR  Peak grad:   16.6 mmHg MV Decel Time: 387 msec     TR Vmax:        204.00 cm/s MV E velocity: 172.00 cm/s MV A velocity: 105.00 cm/s  SHUNTS MV E/A ratio:  1.64  Systemic VTI:  0.23 m                             Systemic Diam: 2.18 cm Lennie Odor MD Electronically signed by Lennie Odor MD Signature Date/Time: 07/12/2022/3:19:17 PM    Final    MR ANGIO HEAD WO CONTRAST  Result Date: 07/12/2022 CLINICAL DATA:  72 year old female sudden severe headache. Neurologic deficit. Left caudate lacunar infarct on brain MRI yesterday. EXAM: MRA HEAD WITHOUT CONTRAST TECHNIQUE: Angiographic images of the Circle of Willis were acquired using MRA technique without intravenous contrast. COMPARISON:  Brain MRI yesterday. FINDINGS: Anterior circulation: Antegrade flow in both ICA siphons. No siphon stenosis. Ophthalmic and posterior communicating artery origins appear normal. Patent carotid termini, MCA and ACA origins. Dominant left ACA A1. Right A1 is diminutive but patent. Anterior communicating artery is within normal limits. Visible ACA branches are within normal limits. Left MCA M1 bifurcates early without stenosis. Right MCA M1 segment and bifurcation appear patent without stenosis. Visible bilateral MCA branches are within normal limits. Posterior circulation: Antegrade flow in the posterior circulation. Distal right vertebral artery appears dominant. No distal vertebral or vertebrobasilar junction stenosis. Normal right PICA origin. Left AICA appears dominant and patent. Patent basilar artery without stenosis. Patent SCA and left PCA origins. Left PCA branches are within normal limits. Fetal type right PCA origin with severe right P2 segment stenosis (series 253, image 4) but maintained distal flow signal. Small left posterior communicating artery is also present. Anatomic variants: Dominant right vertebral artery, fetal right PCA origin, dominant left ACA A1. Other: No evidence of intracranial mass effect,  ventriculomegaly. IMPRESSION: 1. Negative for large vessel occlusion. 2. Positive for Severe Right PCA P2 segment stenosis. But maintained distal flow signal. 3. Otherwise mild for age intracranial atherosclerosis. Electronically Signed   By: Odessa Fleming M.D.   On: 07/12/2022 10:18   MR BRAIN WO CONTRAST  Result Date: 07/11/2022 CLINICAL DATA:  Neuro deficit, acute, stroke suspected. Sudden severe headache. EXAM: MRI HEAD WITHOUT CONTRAST TECHNIQUE: Multiplanar, multiecho pulse sequences of the brain and surrounding structures were obtained without intravenous contrast. COMPARISON:  MR head without contrast scratched at CT head without contrast 07/11/2022. MR head without contrast 02/08/2022 FINDINGS: Brain: Diffusion trace signal hyperintensities are present in the corona radiata bilaterally. These demonstrate T2 shine through on the [facility] maps. Acute punctate infarct is present in the left caudate head. No other acute infarcts are present. Confluent T2 and subcortical FLAIR hyperintensities are moderately advanced for age. Moderate atrophy is also advanced for age. The ventricles are proportionate to the degree of atrophy. No significant extraaxial fluid collection is present. White matter changes extending into the brainstem are stable. Cerebellum is within normal limits. Midline structures are within normal limits. Vascular: Flow is present in the major intracranial arteries. Skull and upper cervical spine: The craniocervical junction is normal. Upper cervical spine is within normal limits. T1 marrow signal is somewhat depressed throughout, likely corresponding to the knee a. No focal lesions are present. Sinuses/Orbits: The paranasal sinuses and mastoid air cells are clear. Bilateral lens replacements are noted. Globes and orbits are otherwise unremarkable. IMPRESSION: 1. Acute punctate infarct of the left caudate head. 2. Diffusion trace signal hyperintensities in the corona radiata bilaterally likely reflect  evolving subacute infarcts. 3. Moderate atrophy and white matter disease is moderately advanced for age. This likely reflects the sequela of chronic microvascular ischemia. Electronically Signed   By: Marin Roberts  M.D.   On: 07/11/2022 16:24   CT HEAD WO CONTRAST  Result Date: 07/11/2022 CLINICAL DATA:  Headache, sudden, severe EXAM: CT HEAD WITHOUT CONTRAST TECHNIQUE: Contiguous axial images were obtained from the base of the skull through the vertex without intravenous contrast. RADIATION DOSE REDUCTION: This exam was performed according to the departmental dose-optimization program which includes automated exposure control, adjustment of the mA and/or kV according to patient size and/or use of iterative reconstruction technique. COMPARISON:  07/27/2021 FINDINGS: Brain: No evidence of acute infarction, hemorrhage, hydrocephalus, extra-axial collection or mass lesion/mass effect. Chronic right basal ganglia lacunar infarct. Patchy low-density changes within the periventricular and subcortical white matter most compatible with chronic microvascular ischemic change. Mild-moderate diffuse cerebral volume loss. Vascular: Atherosclerotic calcifications involving the large vessels of the skull base. No unexpected hyperdense vessel. Skull: Normal. Negative for fracture or focal lesion. Sinuses/Orbits: No acute finding. Other: None. IMPRESSION: 1. No acute intracranial findings. 2. Chronic microvascular ischemic change and cerebral volume loss. Electronically Signed   By: Duanne Guess D.O.   On: 07/11/2022 11:50     Discharge Instructions: Discharge Instructions     Ambulatory referral to Neurology   Complete by: As directed    An appointment is requested in approximately: 8 weeks   Ambulatory referral to Occupational Therapy   Complete by: As directed    Call MD for:  difficulty breathing, headache or visual disturbances   Complete by: As directed    Call MD for:  persistant dizziness or  light-headedness   Complete by: As directed    Call MD for:  persistant nausea and vomiting   Complete by: As directed    Call MD for:  severe uncontrolled pain   Complete by: As directed    Diet - low sodium heart healthy   Complete by: As directed    Discharge instructions   Complete by: As directed    Bailey [name] was admitted with a small stroke on the left side of her brain.  It is unclear if she will have any specific deficits from this and she already seems to have recovered back to close to her baseline.  The most important thing will be to prevent any further strokes down the line.  1.  She will need to continue aspirin 81 mg daily and Plavix 75 mg daily for 17 days following discharge and then transition to just taking aspirin alone indefinitely 2.  She should continue taking her Lipitor 40 mg daily 3.  She will need to continue taking her amlodipine 5 mg at bedtime and start taking olmesartan 20 mg daily. The nephrologist will manage her blood pressure medicines with dialysis 4.  She will need outpatient follow-up with her primary care doctor in the next 1 to 2 weeks and then follow-up with the stroke doctor likely in 2 months. 5.  Please ask your primary doctor about changing advanced directives, or referral to outpatient palliative team if needed.  You can also ask them about any other home health resources that you might need.   Increase activity slowly   Complete by: As directed        Discharge Instructions   None     Signed: Lyndle Herrlich, MD 07/14/2022, 11:35 AM   Pager: [phone]

## 2022-07-13 NOTE — Progress Notes (Signed)
Patient left floor for ordered HD. VSS. Respirations even and unlabored. RA. NAD. Patient denies CP or SOB.

## 2022-07-13 NOTE — Progress Notes (Signed)
1610: Pt's BP noted 201/66. Patient asymptomatic. NAD. Respirations are even and unlabored. RA. Patient denies CP or SOB.   0755: Writer notified Lyndle Herrlich, MD. No new orders placed at this time. MD aware of patient scheduled for HD today.  [id number] Herrlich, MD ordered PO Hydralazine 25 mg x 1 dose.   5409: PO Hydralazine 25 mg administered, per order.   Patient's BP currently 180/57. Patient remains asymptomatic. NAD. Patient to receive HD this afternoon.    Safety precautions in place. Call bell within reach. Bed in lowest position and locked. Report given to HD RN.

## 2022-07-14 ENCOUNTER — Encounter: Payer: Self-pay | Admitting: Hematology and Oncology

## 2022-07-14 ENCOUNTER — Other Ambulatory Visit (HOSPITAL_COMMUNITY): Payer: Self-pay

## 2022-07-14 DIAGNOSIS — I10 Essential (primary) hypertension: Secondary | ICD-10-CM

## 2022-07-14 DIAGNOSIS — I639 Cerebral infarction, unspecified: Secondary | ICD-10-CM | POA: Diagnosis not present

## 2022-07-14 LAB — CBC
HCT: 34.1 % — ABNORMAL LOW (ref 36.0–46.0)
Hemoglobin: 10.8 g/dL — ABNORMAL LOW (ref 12.0–15.0)
MCH: 30.6 pg (ref 26.0–34.0)
MCHC: 31.7 g/dL (ref 30.0–36.0)
MCV: 96.6 fL (ref 80.0–100.0)
Platelets: 95 10*3/uL — ABNORMAL LOW (ref 150–400)
RBC: 3.53 MIL/uL — ABNORMAL LOW (ref 3.87–5.11)
RDW: 15.9 % — ABNORMAL HIGH (ref 11.5–15.5)
WBC: 6 10*3/uL (ref 4.0–10.5)
nRBC: 0 % (ref 0.0–0.2)

## 2022-07-14 LAB — GLUCOSE, CAPILLARY
Glucose-Capillary: 137 mg/dL — ABNORMAL HIGH (ref 70–99)
Glucose-Capillary: 117 mg/dL — ABNORMAL HIGH (ref 70–99)
Glucose-Capillary: 162 mg/dL — ABNORMAL HIGH (ref 70–99)
Glucose-Capillary: 175 mg/dL — ABNORMAL HIGH (ref 70–99)

## 2022-07-14 LAB — RENAL FUNCTION PANEL
Albumin: 3.1 g/dL — ABNORMAL LOW (ref 3.5–5.0)
Anion gap: 10 (ref 5–15)
BUN: 29 mg/dL — ABNORMAL HIGH (ref 8–23)
CO2: 28 mmol/L (ref 22–32)
Calcium: 8.9 mg/dL (ref 8.9–10.3)
Chloride: 99 mmol/L (ref 98–111)
Creatinine, Ser: 3.72 mg/dL — ABNORMAL HIGH (ref 0.44–1.00)
GFR, Estimated: 12 mL/min — ABNORMAL LOW (ref >60)
Glucose, Bld: 121 mg/dL — ABNORMAL HIGH (ref 70–99)
Phosphorus: 2 mg/dL — ABNORMAL LOW (ref 2.5–4.6)
Potassium: 3.9 mmol/L (ref 3.5–5.1)
Sodium: 137 mmol/L (ref 135–145)

## 2022-07-14 LAB — HEPATITIS B SURFACE ANTIBODY, QUANTITATIVE: Hep B S AB Quant (Post): 159 m[IU]/mL (ref >9.9)

## 2022-07-14 MED ORDER — AMLODIPINE BESYLATE 5 MG PO TABS
5.0000 mg | ORAL_TABLET | Freq: Every day | ORAL | 0 refills | Status: DC
  Filled 2022-07-14: qty 30, 30d supply, fill #0

## 2022-07-14 NOTE — Plan of Care (Signed)
  Problem: Ischemic Stroke/TIA Tissue Perfusion: Goal: Complications of ischemic stroke/TIA will be minimized Outcome: Progressing   Problem: Education: Goal: Knowledge of disease or condition will improve Outcome: Progressing Goal: Knowledge of secondary prevention will improve (MUST DOCUMENT ALL) Outcome: Progressing Goal: Knowledge of patient specific risk factors will improve [name] N/A or DELETE if not current risk factor) Outcome: Progressing   Problem: Activity: Goal: Risk for activity intolerance will decrease Outcome: Progressing   Problem: Nutrition: Goal: Adequate nutrition will be maintained Outcome: Progressing   Problem: Coping: Goal: Level of anxiety will decrease Outcome: Progressing   Problem: Education: Goal: Knowledge of disease or condition will improve Outcome: Progressing Goal: Knowledge of secondary prevention will improve (MUST DOCUMENT ALL) Outcome: Progressing Goal: Knowledge of patient specific risk factors will improve [name] N/A or DELETE if not current risk factor) Outcome: Progressing

## 2022-07-14 NOTE — Progress Notes (Signed)
Patient ID: Bailey [name], female   DOB: 05-30-1950, 72 y.o.   MRN: [id number] [facility] Progress Note   Assessment/ Plan:   1.  Acute punctate left caudate CVA: Seen on MRI 07/11/2022 with diffuse trace signal hyperintensities in the corona radiata bilaterally reflecting subacute infarcts along with sequela of chronic microvascular ischemia.  MRA showed severe right PCA P2 segment stenosis but maintained distal flow and was negative for large vessel occlusion.  She is on aspirin and Plavix with plans to transition to aspirin monotherapy 3 weeks. 2. ESRD: Usually gets hemodialysis on MWF schedule and underwent hemodialysis uneventfully yesterday.  Will order for hemodialysis again tomorrow if she is still here. 3. Anemia: Low hemoglobin/hematocrit noted without any overt blood loss, will follow trend and resume ESA. 4. CKD-MBD: With low phosphorus level noted, not on binder at this time after discontinuation of Auryxia or agents for PTH control. 5. Nutrition: Continue renal diet with protein supplementation 6. Hypertension: Blood pressure elevated but appears to be improving after being started on ARB and CCB.  There is room to uptitrate on both of these agents; which can be done as an outpatient while following blood pressures.  Heart rate appears to be prohibitive to beta-blocker use.  Subjective:   Without any complaints at this time and reports to be feeling better/wanting to go home.    Objective:   BP (!) 175/67 (BP Location: Left Arm)   Pulse 62   Temp 98.2 F (36.8 C) (Oral)   Resp 18   Ht 5\' 4"  (1.626 m)   Wt 45.2 kg   LMP  (LMP Unknown)   SpO2 100%   BMI 17.10 kg/m   Physical Exam: Gen: Comfortably resting in bed, awake, alert and oriented. CVS: Pulse regular rhythm, normal rate, S1 and S2 Resp: Clear to auscultation bilaterally, no rales/ Abd: Soft, flat, nontender, bowel sounds normal Ext: Left upper arm AV fistula with positive thrill and  bruit  Labs: BMET Recent Labs  Lab 07/11/22 1106 07/11/22 1114 07/12/22 0422 07/13/22 0832 07/14/22 0635  NA 136 139 137 138 137  K 3.3* 3.5 3.5 4.0 3.9  CL 96* 95* 99 99 99  CO2 32  --  28 30 28   GLUCOSE 176* 169* 117* 112* 121*  BUN 18 21 32* 46* 29*  CREATININE 3.16* 3.30* 4.09* 5.29* 3.72*  CALCIUM 8.1*  --  8.5* 9.0 8.9  PHOS  --   --  2.0* 2.1* 2.0*   CBC Recent Labs  Lab 07/11/22 1106 07/11/22 1114 07/12/22 0422 07/13/22 0832 07/14/22 0635  WBC 4.7  --  5.3 4.9 6.0  NEUTROABS 2.8  --  2.2  --   --   HGB 8.8* 9.5* 9.3* 10.5* 10.8*  HCT 28.7* 28.0* 29.7* 34.6* 34.1*  MCV 100.3*  --  98.3 99.4 96.6  PLT 73*  --  71* 84* 95*      Medications:     amLODipine  5 mg Oral Daily   aspirin EC  81 mg Oral Daily   Chlorhexidine Gluconate Cloth  6 each Topical Q0600   clopidogrel  75 mg Oral Daily   insulin aspart  0-6 Units Subcutaneous TID WC   irbesartan  150 mg Oral Daily   Zetta Bills, MD 07/14/2022, 8:20 AM

## 2022-07-14 NOTE — Plan of Care (Addendum)
[facility] Patient Discharge Orders- [facility] CLINIC: [facility]  Patient's name: Bailey [name] Admit/DC Dates: 07/11/2022 - hasn't been discharged yet.  Discharge Diagnoses: Acute punctate left caudate CVA -  Will be discharged on DAPT for 17 days then transition to ASA as monotherapy. HTN - Discharge on Amlodipine 5mg  QHS and Olmesartan 20mg  daily. Titrate up if indicated  Aranesp: Given: No    Last Hgb: 10.8 PRBC's Given: No   EDW Change: No  Bath Change: No  Access intervention/Change: No  Hectorol/Calcitriol change: N/A  Discharge Labs: Calcium 8.9 Phosphorus 2.0 Albumin 3.1 K+ 3.9  IV Antibiotics: No  On Coumadin?: No, on DAPT for 17 days then transition to ASA for monotherapy    D/C Meds to be reconciled by nurse after every discharge.  Completed By: Salome Holmes, NP-C 07/14/2022, 3:12 PM  [facility] Pager: [phone]  Reviewed by: MD:______ RN_______

## 2022-07-14 NOTE — Progress Notes (Signed)
Occupational Therapy Treatment Patient Details Name: Bailey [name] MRN: [id number] DOB: [birth year] Today's Date: 07/14/2022   History of present illness Pt is a 71 y,o. F who presents 07/11/2022 with acute onset of dizziness, headache and weakness this morning. MRI showing acute left caudate punctuate stroke. Significant PMH: T2DM, thromboyctyopenia, ESRD on MWF HD, HTN, bilateral first toe amputations, reported prior stroke.   OT comments  Pt making steady progress towards OT goals. Pt able to mobilize to/from bathroom without AD w/ min guard though unsteadiness noted due to baseline B great toe amputations. Pt able to manage toileting task and various ADLs standing at sink with min guard w/ minor cues/assist needed to locate ADL items on R side of counter. Educated pt re: fall prevention, use of shower chair at home and assist with med mgmt at home w/ pt verbalizing understanding. Provided gait belt for pt/family use to assist pt into the home at DC.   Recommendations for follow up therapy are one component of a multi-disciplinary discharge planning process, led by the attending physician.  Recommendations may be updated based on patient status, additional functional criteria and insurance authorization.    Assistance Recommended at Discharge Intermittent Supervision/Assistance  Patient can return home with the following  A little help with walking and/or transfers;A little help with bathing/dressing/bathroom;Direct supervision/assist for medications management;Direct supervision/assist for financial management;Assist for transportation;Help with stairs or ramp for entrance;Assistance with cooking/housework   Equipment Recommendations  None recommended by OT (has all needed DME)    Recommendations for Other Services      Precautions / Restrictions Precautions Precautions: Fall Restrictions Weight Bearing Restrictions: No       Mobility Bed Mobility Overal bed mobility: Modified  Independent                  Transfers Overall transfer level: Needs assistance Equipment used: None Transfers: Sit to/from Stand Sit to Stand: Min guard                 Balance Overall balance assessment: Mild deficits observed, not formally tested                                         ADL either performed or assessed with clinical judgement   ADL Overall ADL's : Needs assistance/impaired     Grooming: Min guard;Standing;Oral care;Wash/dry face;Wash/dry hands Grooming Details (indicate cue type and reason): did need cues to locate toothbrush on R side, forgot she had already located toothpaste, etc                 Toilet Transfer: Min guard;Ambulation;Regular Teacher, adult education Details (indicate cue type and reason): handheld assist w/ some unsteadiness noted. able to stand from toilet without assist Toileting- Clothing Manipulation and Hygiene: Min guard;Sitting/lateral lean;Sit to/from stand Toileting - Clothing Manipulation Details (indicate cue type and reason): assist for balance/clothing mgmt in standing     Functional mobility during ADLs: Min guard General ADL Comments: likely baseline balance and cognitive deficits impacting full independence though also question some R sided visual deficits    Extremity/Trunk Assessment Upper Extremity Assessment Upper Extremity Assessment: Generalized weakness   Lower Extremity Assessment Lower Extremity Assessment: Defer to PT evaluation        Vision   Vision Assessment?: No apparent visual deficits   Perception     Praxis      Cognition Arousal/Alertness: Awake/alert  Behavior During Therapy: WFL for tasks assessed/performed Overall Cognitive Status: History of cognitive impairments - at baseline                                 General Comments: Hx STM deficits at baseline; pt is pleasant and demonstrates fair problem solving and thinking skills throughout.  perseverating on where husband is, difficulty locating ADL items (unsure if due to new visual issues)        Exercises      Shoulder Instructions       General Comments pt reports unsure if her meal trays are automatic-located menu and assisted pt in ordering lunch    Pertinent Vitals/ Pain       Pain Assessment Pain Assessment: No/denies pain  Home Living                                          Prior Functioning/Environment              Frequency  Min 1X/week        Progress Toward Goals  OT Goals(current goals can now be found in the care plan section)  Progress towards OT goals: Progressing toward goals  Acute Rehab OT Goals Patient Stated Goal: home today OT Goal Formulation: With patient Time For Goal Achievement: 07/26/22 Potential to Achieve Goals: Good ADL Goals Pt Will Perform Lower Body Dressing: with modified independence;sit to/from stand;sitting/lateral leans Pt Will Perform Tub/Shower Transfer: Shower transfer;with modified independence;ambulating;shower seat Additional ADL Goal #1: pt will accurately follow 3 step command in prep for ADLs  Plan Discharge plan needs to be updated    Co-evaluation                 AM-PAC OT "6 Clicks" Daily Activity     Outcome Measure   Help from another person eating meals?: None Help from another person taking care of personal grooming?: A Little Help from another person toileting, which includes using toliet, bedpan, or urinal?: A Little Help from another person bathing (including washing, rinsing, drying)?: A Little Help from another person to put on and taking off regular upper body clothing?: A Little Help from another person to put on and taking off regular lower body clothing?: A Little 6 Click Score: 19    End of Session Equipment Utilized During Treatment: Gait belt  OT Visit Diagnosis: Unsteadiness on feet (R26.81);Other abnormalities of gait and mobility  (R26.89);Muscle weakness (generalized) (M62.81)   Activity Tolerance Patient tolerated treatment well   Patient Left in chair;with call bell/phone within reach;with chair alarm set   Nurse Communication Mobility status        Time: 4098-1191 OT Time Calculation (min): 23 min  Charges: OT General Charges $OT Visit: 1 Visit OT Treatments $Self Care/Home Management : 8-22 mins  Bradd Canary, OTR/L Acute Rehab Services Office: [phone]   Lorre Munroe 07/14/2022, 12:14 PM

## 2022-07-14 NOTE — TOC Transition Note (Signed)
Transition of Care [facility]) - CM/SW Discharge Note   Patient Details  Name: Bailey [name] MRN: [id number] Date of Birth: 1950-12-05  Transition of Care [facility] CM/SW Contact:  Kermit Balo, RN Phone Number: 07/14/2022, 12:05 PM   Clinical Narrative:    Pt is discharging home with home health services through [facility]. Information on the AVS.  Pt has needed DME at home.  Pt has medications coming from [facility] pharmacy to be delivered to the room.  Pt has transportation home.   Final next level of care: Home w Home Health Services Barriers to Discharge: No Barriers Identified   Patient Goals and CMS Choice CMS Medicare.gov Compare Post Acute Care list provided to:: Patient Represenative (must comment) Choice offered to / list presented to : Adult Children  Discharge Placement                         Discharge Plan and Services Additional resources added to the After Visit Summary for     Discharge Planning Services: CM Consult                      HH Arranged: PT, OT, Nurse's Aide HH Agency: [facility] Date [facility] Agency Contacted: 07/14/22   Representative spoke with at [facility] Agency: Morrie Sheldon  Social Determinants of Health (SDOH) Interventions SDOH Screenings   Food Insecurity: No Food Insecurity (07/13/2022)  Housing: Low Risk  (07/13/2022)  Transportation Needs: No Transportation Needs (07/13/2022)  Utilities: Not At Risk (07/13/2022)  Tobacco Use: Low Risk  (07/11/2022)     Readmission Risk Interventions     No data to display

## 2022-07-14 NOTE — Progress Notes (Signed)
Chaplain follow up visit. Chaplain was notified that pt's son would like to review the ACP documents his mother declined. Pt greeted chaplain warmly and stated she remembered this writer from our visit yesterday. Chaplain informed Mrs Cavasos that I had been notified that her son would like to review these documents although it is still the pt's decision whether to complete them or not. Mrs Weilert was appreciative and stated that she did think her son was their power of attorney anyway.   Chaplain notified pt to request follow up if they have any questions or need clarification or would like to complete the documents.  Please page as further needs arise.  Maryanna Shape. Carley Hammed, M.Div. [facility] Chaplain Pager [phone] Office [phone]

## 2022-07-14 NOTE — Progress Notes (Signed)
D/C order noted. Contacted [facility] to advise clinic of pt's D/C today and that pt should resume care tomorrow.   Olivia Canter Renal Navigator [phone]

## 2022-07-15 ENCOUNTER — Other Ambulatory Visit (HOSPITAL_COMMUNITY): Payer: Self-pay

## 2022-07-15 DIAGNOSIS — N189 Chronic kidney disease, unspecified: Secondary | ICD-10-CM | POA: Diagnosis not present

## 2022-07-15 DIAGNOSIS — D631 Anemia in chronic kidney disease: Secondary | ICD-10-CM | POA: Diagnosis not present

## 2022-07-15 DIAGNOSIS — D509 Iron deficiency anemia, unspecified: Secondary | ICD-10-CM | POA: Diagnosis not present

## 2022-07-15 DIAGNOSIS — Z992 Dependence on renal dialysis: Secondary | ICD-10-CM | POA: Diagnosis not present

## 2022-07-15 DIAGNOSIS — N2581 Secondary hyperparathyroidism of renal origin: Secondary | ICD-10-CM | POA: Diagnosis not present

## 2022-07-15 DIAGNOSIS — N186 End stage renal disease: Secondary | ICD-10-CM | POA: Diagnosis not present

## 2022-07-16 DIAGNOSIS — D696 Thrombocytopenia, unspecified: Secondary | ICD-10-CM | POA: Diagnosis not present

## 2022-07-16 DIAGNOSIS — E46 Unspecified protein-calorie malnutrition: Secondary | ICD-10-CM | POA: Diagnosis not present

## 2022-07-16 DIAGNOSIS — F0283 Dementia in other diseases classified elsewhere, unspecified severity, with mood disturbance: Secondary | ICD-10-CM | POA: Diagnosis not present

## 2022-07-16 DIAGNOSIS — I6621 Occlusion and stenosis of right posterior cerebral artery: Secondary | ICD-10-CM | POA: Diagnosis not present

## 2022-07-16 DIAGNOSIS — Z7902 Long term (current) use of antithrombotics/antiplatelets: Secondary | ICD-10-CM | POA: Diagnosis not present

## 2022-07-16 DIAGNOSIS — F32A Depression, unspecified: Secondary | ICD-10-CM | POA: Diagnosis not present

## 2022-07-16 DIAGNOSIS — Z992 Dependence on renal dialysis: Secondary | ICD-10-CM | POA: Diagnosis not present

## 2022-07-16 DIAGNOSIS — Z5982 Transportation insecurity: Secondary | ICD-10-CM | POA: Diagnosis not present

## 2022-07-16 DIAGNOSIS — E1122 Type 2 diabetes mellitus with diabetic chronic kidney disease: Secondary | ICD-10-CM | POA: Diagnosis not present

## 2022-07-16 DIAGNOSIS — Z7982 Long term (current) use of aspirin: Secondary | ICD-10-CM | POA: Diagnosis not present

## 2022-07-16 DIAGNOSIS — Z9181 History of falling: Secondary | ICD-10-CM | POA: Diagnosis not present

## 2022-07-16 DIAGNOSIS — E876 Hypokalemia: Secondary | ICD-10-CM | POA: Diagnosis not present

## 2022-07-16 DIAGNOSIS — M5136 Other intervertebral disc degeneration, lumbar region: Secondary | ICD-10-CM | POA: Diagnosis not present

## 2022-07-16 DIAGNOSIS — Z604 Social exclusion and rejection: Secondary | ICD-10-CM | POA: Diagnosis not present

## 2022-07-16 DIAGNOSIS — E11319 Type 2 diabetes mellitus with unspecified diabetic retinopathy without macular edema: Secondary | ICD-10-CM | POA: Diagnosis not present

## 2022-07-16 DIAGNOSIS — I120 Hypertensive chronic kidney disease with stage 5 chronic kidney disease or end stage renal disease: Secondary | ICD-10-CM | POA: Diagnosis not present

## 2022-07-16 DIAGNOSIS — E538 Deficiency of other specified B group vitamins: Secondary | ICD-10-CM | POA: Diagnosis not present

## 2022-07-16 DIAGNOSIS — E785 Hyperlipidemia, unspecified: Secondary | ICD-10-CM | POA: Diagnosis not present

## 2022-07-16 DIAGNOSIS — N186 End stage renal disease: Secondary | ICD-10-CM | POA: Diagnosis not present

## 2022-07-16 DIAGNOSIS — D631 Anemia in chronic kidney disease: Secondary | ICD-10-CM | POA: Diagnosis not present

## 2022-07-16 DIAGNOSIS — F0153 Vascular dementia, unspecified severity, with mood disturbance: Secondary | ICD-10-CM | POA: Diagnosis not present

## 2022-07-16 DIAGNOSIS — E1142 Type 2 diabetes mellitus with diabetic polyneuropathy: Secondary | ICD-10-CM | POA: Diagnosis not present

## 2022-07-16 DIAGNOSIS — E559 Vitamin D deficiency, unspecified: Secondary | ICD-10-CM | POA: Diagnosis not present

## 2022-07-16 NOTE — TOC Transition Note (Signed)
Transition of Care - Initial Contact from Inpatient Facility  Date of discharge: 07/14/22 Date of contact: 07/16/22  Method: Attempted Phone Call Spoke to: No Answer  Called Patient's husband to discuss Mrs. Awwad's recent transition of care from recent inpatient hospitalization but he didn't pick up the phone. Left a voicemail to contact [facility] HD unit [phone] for any questions or concerns.   Patient received HD yesterday and scheduled for next HD 5/24 at [facility].  Salome Holmes, NP

## 2022-07-17 ENCOUNTER — Telehealth: Payer: Self-pay | Admitting: *Deleted

## 2022-07-17 DIAGNOSIS — N2581 Secondary hyperparathyroidism of renal origin: Secondary | ICD-10-CM | POA: Diagnosis not present

## 2022-07-17 DIAGNOSIS — N186 End stage renal disease: Secondary | ICD-10-CM | POA: Diagnosis not present

## 2022-07-17 DIAGNOSIS — D509 Iron deficiency anemia, unspecified: Secondary | ICD-10-CM | POA: Diagnosis not present

## 2022-07-17 DIAGNOSIS — D631 Anemia in chronic kidney disease: Secondary | ICD-10-CM | POA: Diagnosis not present

## 2022-07-17 DIAGNOSIS — N189 Chronic kidney disease, unspecified: Secondary | ICD-10-CM | POA: Diagnosis not present

## 2022-07-17 DIAGNOSIS — Z992 Dependence on renal dialysis: Secondary | ICD-10-CM | POA: Diagnosis not present

## 2022-07-17 NOTE — Telephone Encounter (Signed)
Call from [place] [facility] from [facility] requesting VO for PT 1 time a week for 8 weeks.  Requested for Balance, and Investment banker, operational.  Verbal given.  Will send to PCP for verification or denial. Arlys John [phone].  Patient was inpatient .  Has a pvt PCP who would not approve until patient is seen in her office for follow up.  Requested that the Clinic do until patient is seen in her office.

## 2022-07-20 DIAGNOSIS — N189 Chronic kidney disease, unspecified: Secondary | ICD-10-CM | POA: Diagnosis not present

## 2022-07-20 DIAGNOSIS — N2581 Secondary hyperparathyroidism of renal origin: Secondary | ICD-10-CM | POA: Diagnosis not present

## 2022-07-20 DIAGNOSIS — N186 End stage renal disease: Secondary | ICD-10-CM | POA: Diagnosis not present

## 2022-07-20 DIAGNOSIS — D509 Iron deficiency anemia, unspecified: Secondary | ICD-10-CM | POA: Diagnosis not present

## 2022-07-20 DIAGNOSIS — Z992 Dependence on renal dialysis: Secondary | ICD-10-CM | POA: Diagnosis not present

## 2022-07-20 DIAGNOSIS — D631 Anemia in chronic kidney disease: Secondary | ICD-10-CM | POA: Diagnosis not present

## 2022-07-21 DIAGNOSIS — I6621 Occlusion and stenosis of right posterior cerebral artery: Secondary | ICD-10-CM | POA: Diagnosis not present

## 2022-07-21 DIAGNOSIS — I120 Hypertensive chronic kidney disease with stage 5 chronic kidney disease or end stage renal disease: Secondary | ICD-10-CM | POA: Diagnosis not present

## 2022-07-21 DIAGNOSIS — N186 End stage renal disease: Secondary | ICD-10-CM | POA: Diagnosis not present

## 2022-07-21 DIAGNOSIS — D631 Anemia in chronic kidney disease: Secondary | ICD-10-CM | POA: Diagnosis not present

## 2022-07-21 DIAGNOSIS — E1122 Type 2 diabetes mellitus with diabetic chronic kidney disease: Secondary | ICD-10-CM | POA: Diagnosis not present

## 2022-07-21 DIAGNOSIS — Z992 Dependence on renal dialysis: Secondary | ICD-10-CM | POA: Diagnosis not present

## 2022-07-22 DIAGNOSIS — N186 End stage renal disease: Secondary | ICD-10-CM | POA: Diagnosis not present

## 2022-07-22 DIAGNOSIS — Z992 Dependence on renal dialysis: Secondary | ICD-10-CM | POA: Diagnosis not present

## 2022-07-22 DIAGNOSIS — N2581 Secondary hyperparathyroidism of renal origin: Secondary | ICD-10-CM | POA: Diagnosis not present

## 2022-07-22 DIAGNOSIS — N189 Chronic kidney disease, unspecified: Secondary | ICD-10-CM | POA: Diagnosis not present

## 2022-07-22 DIAGNOSIS — D631 Anemia in chronic kidney disease: Secondary | ICD-10-CM | POA: Diagnosis not present

## 2022-07-22 DIAGNOSIS — D509 Iron deficiency anemia, unspecified: Secondary | ICD-10-CM | POA: Diagnosis not present

## 2022-07-23 ENCOUNTER — Other Ambulatory Visit (HOSPITAL_COMMUNITY): Payer: Self-pay

## 2022-07-23 ENCOUNTER — Other Ambulatory Visit: Payer: Self-pay

## 2022-07-23 ENCOUNTER — Other Ambulatory Visit: Payer: Self-pay | Admitting: Psychiatry

## 2022-07-24 ENCOUNTER — Other Ambulatory Visit (HOSPITAL_COMMUNITY): Payer: Self-pay

## 2022-07-24 DIAGNOSIS — N2581 Secondary hyperparathyroidism of renal origin: Secondary | ICD-10-CM | POA: Diagnosis not present

## 2022-07-24 DIAGNOSIS — D509 Iron deficiency anemia, unspecified: Secondary | ICD-10-CM | POA: Diagnosis not present

## 2022-07-24 DIAGNOSIS — Z992 Dependence on renal dialysis: Secondary | ICD-10-CM | POA: Diagnosis not present

## 2022-07-24 DIAGNOSIS — D631 Anemia in chronic kidney disease: Secondary | ICD-10-CM | POA: Diagnosis not present

## 2022-07-24 DIAGNOSIS — N189 Chronic kidney disease, unspecified: Secondary | ICD-10-CM | POA: Diagnosis not present

## 2022-07-24 DIAGNOSIS — N186 End stage renal disease: Secondary | ICD-10-CM | POA: Diagnosis not present

## 2022-07-25 DIAGNOSIS — N186 End stage renal disease: Secondary | ICD-10-CM | POA: Diagnosis not present

## 2022-07-25 DIAGNOSIS — E1129 Type 2 diabetes mellitus with other diabetic kidney complication: Secondary | ICD-10-CM | POA: Diagnosis not present

## 2022-07-25 DIAGNOSIS — Z992 Dependence on renal dialysis: Secondary | ICD-10-CM | POA: Diagnosis not present

## 2022-07-27 DIAGNOSIS — N186 End stage renal disease: Secondary | ICD-10-CM | POA: Diagnosis not present

## 2022-07-27 DIAGNOSIS — Z992 Dependence on renal dialysis: Secondary | ICD-10-CM | POA: Diagnosis not present

## 2022-07-27 DIAGNOSIS — D509 Iron deficiency anemia, unspecified: Secondary | ICD-10-CM | POA: Diagnosis not present

## 2022-07-27 DIAGNOSIS — N189 Chronic kidney disease, unspecified: Secondary | ICD-10-CM | POA: Diagnosis not present

## 2022-07-27 DIAGNOSIS — D631 Anemia in chronic kidney disease: Secondary | ICD-10-CM | POA: Diagnosis not present

## 2022-07-27 DIAGNOSIS — R110 Nausea: Secondary | ICD-10-CM | POA: Diagnosis not present

## 2022-07-27 DIAGNOSIS — E1122 Type 2 diabetes mellitus with diabetic chronic kidney disease: Secondary | ICD-10-CM | POA: Diagnosis not present

## 2022-07-27 DIAGNOSIS — N2581 Secondary hyperparathyroidism of renal origin: Secondary | ICD-10-CM | POA: Diagnosis not present

## 2022-07-28 DIAGNOSIS — N186 End stage renal disease: Secondary | ICD-10-CM | POA: Diagnosis not present

## 2022-07-28 DIAGNOSIS — I120 Hypertensive chronic kidney disease with stage 5 chronic kidney disease or end stage renal disease: Secondary | ICD-10-CM | POA: Diagnosis not present

## 2022-07-28 DIAGNOSIS — I6621 Occlusion and stenosis of right posterior cerebral artery: Secondary | ICD-10-CM | POA: Diagnosis not present

## 2022-07-28 DIAGNOSIS — Z992 Dependence on renal dialysis: Secondary | ICD-10-CM | POA: Diagnosis not present

## 2022-07-28 DIAGNOSIS — E1122 Type 2 diabetes mellitus with diabetic chronic kidney disease: Secondary | ICD-10-CM | POA: Diagnosis not present

## 2022-07-28 DIAGNOSIS — D631 Anemia in chronic kidney disease: Secondary | ICD-10-CM | POA: Diagnosis not present

## 2022-07-29 ENCOUNTER — Other Ambulatory Visit (HOSPITAL_COMMUNITY): Payer: Self-pay

## 2022-07-29 DIAGNOSIS — N3000 Acute cystitis without hematuria: Secondary | ICD-10-CM | POA: Diagnosis not present

## 2022-07-29 DIAGNOSIS — E1122 Type 2 diabetes mellitus with diabetic chronic kidney disease: Secondary | ICD-10-CM | POA: Diagnosis not present

## 2022-07-29 DIAGNOSIS — I129 Hypertensive chronic kidney disease with stage 1 through stage 4 chronic kidney disease, or unspecified chronic kidney disease: Secondary | ICD-10-CM | POA: Diagnosis not present

## 2022-07-29 DIAGNOSIS — D509 Iron deficiency anemia, unspecified: Secondary | ICD-10-CM | POA: Diagnosis not present

## 2022-07-29 DIAGNOSIS — R110 Nausea: Secondary | ICD-10-CM | POA: Diagnosis not present

## 2022-07-29 DIAGNOSIS — N189 Chronic kidney disease, unspecified: Secondary | ICD-10-CM | POA: Diagnosis not present

## 2022-07-29 DIAGNOSIS — I10 Essential (primary) hypertension: Secondary | ICD-10-CM | POA: Diagnosis not present

## 2022-07-29 DIAGNOSIS — N2581 Secondary hyperparathyroidism of renal origin: Secondary | ICD-10-CM | POA: Diagnosis not present

## 2022-07-29 DIAGNOSIS — Z992 Dependence on renal dialysis: Secondary | ICD-10-CM | POA: Diagnosis not present

## 2022-07-29 DIAGNOSIS — E1165 Type 2 diabetes mellitus with hyperglycemia: Secondary | ICD-10-CM | POA: Diagnosis not present

## 2022-07-29 DIAGNOSIS — N184 Chronic kidney disease, stage 4 (severe): Secondary | ICD-10-CM | POA: Diagnosis not present

## 2022-07-29 DIAGNOSIS — N186 End stage renal disease: Secondary | ICD-10-CM | POA: Diagnosis not present

## 2022-07-29 MED ORDER — CLOPIDOGREL BISULFATE 75 MG PO TABS
75.0000 mg | ORAL_TABLET | Freq: Every day | ORAL | 0 refills | Status: DC
  Filled 2022-07-29: qty 18, 18d supply, fill #0

## 2022-07-29 MED ORDER — OLMESARTAN MEDOXOMIL 40 MG PO TABS
20.0000 mg | ORAL_TABLET | Freq: Every day | ORAL | 0 refills | Status: DC
  Filled 2022-07-29 (×2): qty 15, 30d supply, fill #0

## 2022-07-30 DIAGNOSIS — T3695XA Adverse effect of unspecified systemic antibiotic, initial encounter: Secondary | ICD-10-CM | POA: Diagnosis not present

## 2022-07-30 DIAGNOSIS — N186 End stage renal disease: Secondary | ICD-10-CM | POA: Diagnosis not present

## 2022-07-30 DIAGNOSIS — I120 Hypertensive chronic kidney disease with stage 5 chronic kidney disease or end stage renal disease: Secondary | ICD-10-CM | POA: Diagnosis not present

## 2022-07-30 DIAGNOSIS — E1122 Type 2 diabetes mellitus with diabetic chronic kidney disease: Secondary | ICD-10-CM | POA: Diagnosis not present

## 2022-07-30 DIAGNOSIS — I6621 Occlusion and stenosis of right posterior cerebral artery: Secondary | ICD-10-CM | POA: Diagnosis not present

## 2022-07-30 DIAGNOSIS — D631 Anemia in chronic kidney disease: Secondary | ICD-10-CM | POA: Diagnosis not present

## 2022-07-30 DIAGNOSIS — Z992 Dependence on renal dialysis: Secondary | ICD-10-CM | POA: Diagnosis not present

## 2022-07-31 ENCOUNTER — Other Ambulatory Visit (HOSPITAL_COMMUNITY): Payer: Self-pay

## 2022-07-31 DIAGNOSIS — I10 Essential (primary) hypertension: Secondary | ICD-10-CM | POA: Diagnosis not present

## 2022-07-31 DIAGNOSIS — D509 Iron deficiency anemia, unspecified: Secondary | ICD-10-CM | POA: Diagnosis not present

## 2022-07-31 DIAGNOSIS — Z992 Dependence on renal dialysis: Secondary | ICD-10-CM | POA: Diagnosis not present

## 2022-07-31 DIAGNOSIS — N186 End stage renal disease: Secondary | ICD-10-CM | POA: Diagnosis not present

## 2022-07-31 DIAGNOSIS — N2581 Secondary hyperparathyroidism of renal origin: Secondary | ICD-10-CM | POA: Diagnosis not present

## 2022-07-31 DIAGNOSIS — N189 Chronic kidney disease, unspecified: Secondary | ICD-10-CM | POA: Diagnosis not present

## 2022-07-31 DIAGNOSIS — R110 Nausea: Secondary | ICD-10-CM | POA: Diagnosis not present

## 2022-08-03 DIAGNOSIS — Z992 Dependence on renal dialysis: Secondary | ICD-10-CM | POA: Diagnosis not present

## 2022-08-03 DIAGNOSIS — N186 End stage renal disease: Secondary | ICD-10-CM | POA: Diagnosis not present

## 2022-08-03 DIAGNOSIS — N189 Chronic kidney disease, unspecified: Secondary | ICD-10-CM | POA: Diagnosis not present

## 2022-08-03 DIAGNOSIS — N2581 Secondary hyperparathyroidism of renal origin: Secondary | ICD-10-CM | POA: Diagnosis not present

## 2022-08-03 DIAGNOSIS — D509 Iron deficiency anemia, unspecified: Secondary | ICD-10-CM | POA: Diagnosis not present

## 2022-08-03 DIAGNOSIS — R110 Nausea: Secondary | ICD-10-CM | POA: Diagnosis not present

## 2022-08-04 DIAGNOSIS — Z992 Dependence on renal dialysis: Secondary | ICD-10-CM | POA: Diagnosis not present

## 2022-08-04 DIAGNOSIS — E1122 Type 2 diabetes mellitus with diabetic chronic kidney disease: Secondary | ICD-10-CM | POA: Diagnosis not present

## 2022-08-04 DIAGNOSIS — N186 End stage renal disease: Secondary | ICD-10-CM | POA: Diagnosis not present

## 2022-08-04 DIAGNOSIS — I10 Essential (primary) hypertension: Secondary | ICD-10-CM | POA: Diagnosis not present

## 2022-08-04 DIAGNOSIS — D631 Anemia in chronic kidney disease: Secondary | ICD-10-CM | POA: Diagnosis not present

## 2022-08-04 DIAGNOSIS — Z7189 Other specified counseling: Secondary | ICD-10-CM | POA: Diagnosis not present

## 2022-08-04 DIAGNOSIS — E1165 Type 2 diabetes mellitus with hyperglycemia: Secondary | ICD-10-CM | POA: Diagnosis not present

## 2022-08-04 DIAGNOSIS — I120 Hypertensive chronic kidney disease with stage 5 chronic kidney disease or end stage renal disease: Secondary | ICD-10-CM | POA: Diagnosis not present

## 2022-08-04 DIAGNOSIS — I6621 Occlusion and stenosis of right posterior cerebral artery: Secondary | ICD-10-CM | POA: Diagnosis not present

## 2022-08-05 DIAGNOSIS — D509 Iron deficiency anemia, unspecified: Secondary | ICD-10-CM | POA: Diagnosis not present

## 2022-08-05 DIAGNOSIS — N186 End stage renal disease: Secondary | ICD-10-CM | POA: Diagnosis not present

## 2022-08-05 DIAGNOSIS — N2581 Secondary hyperparathyroidism of renal origin: Secondary | ICD-10-CM | POA: Diagnosis not present

## 2022-08-05 DIAGNOSIS — N189 Chronic kidney disease, unspecified: Secondary | ICD-10-CM | POA: Diagnosis not present

## 2022-08-05 DIAGNOSIS — R110 Nausea: Secondary | ICD-10-CM | POA: Diagnosis not present

## 2022-08-05 DIAGNOSIS — Z992 Dependence on renal dialysis: Secondary | ICD-10-CM | POA: Diagnosis not present

## 2022-08-07 DIAGNOSIS — R110 Nausea: Secondary | ICD-10-CM | POA: Diagnosis not present

## 2022-08-07 DIAGNOSIS — N189 Chronic kidney disease, unspecified: Secondary | ICD-10-CM | POA: Diagnosis not present

## 2022-08-07 DIAGNOSIS — Z992 Dependence on renal dialysis: Secondary | ICD-10-CM | POA: Diagnosis not present

## 2022-08-07 DIAGNOSIS — N186 End stage renal disease: Secondary | ICD-10-CM | POA: Diagnosis not present

## 2022-08-07 DIAGNOSIS — N2581 Secondary hyperparathyroidism of renal origin: Secondary | ICD-10-CM | POA: Diagnosis not present

## 2022-08-07 DIAGNOSIS — D509 Iron deficiency anemia, unspecified: Secondary | ICD-10-CM | POA: Diagnosis not present

## 2022-08-10 DIAGNOSIS — N186 End stage renal disease: Secondary | ICD-10-CM | POA: Diagnosis not present

## 2022-08-10 DIAGNOSIS — N2581 Secondary hyperparathyroidism of renal origin: Secondary | ICD-10-CM | POA: Diagnosis not present

## 2022-08-10 DIAGNOSIS — N189 Chronic kidney disease, unspecified: Secondary | ICD-10-CM | POA: Diagnosis not present

## 2022-08-10 DIAGNOSIS — D509 Iron deficiency anemia, unspecified: Secondary | ICD-10-CM | POA: Diagnosis not present

## 2022-08-10 DIAGNOSIS — R110 Nausea: Secondary | ICD-10-CM | POA: Diagnosis not present

## 2022-08-10 DIAGNOSIS — Z992 Dependence on renal dialysis: Secondary | ICD-10-CM | POA: Diagnosis not present

## 2022-08-11 DIAGNOSIS — I6621 Occlusion and stenosis of right posterior cerebral artery: Secondary | ICD-10-CM | POA: Diagnosis not present

## 2022-08-11 DIAGNOSIS — N186 End stage renal disease: Secondary | ICD-10-CM | POA: Diagnosis not present

## 2022-08-11 DIAGNOSIS — I120 Hypertensive chronic kidney disease with stage 5 chronic kidney disease or end stage renal disease: Secondary | ICD-10-CM | POA: Diagnosis not present

## 2022-08-11 DIAGNOSIS — Z992 Dependence on renal dialysis: Secondary | ICD-10-CM | POA: Diagnosis not present

## 2022-08-11 DIAGNOSIS — D631 Anemia in chronic kidney disease: Secondary | ICD-10-CM | POA: Diagnosis not present

## 2022-08-11 DIAGNOSIS — E1122 Type 2 diabetes mellitus with diabetic chronic kidney disease: Secondary | ICD-10-CM | POA: Diagnosis not present

## 2022-08-12 ENCOUNTER — Other Ambulatory Visit (HOSPITAL_COMMUNITY): Payer: Self-pay

## 2022-08-12 DIAGNOSIS — N186 End stage renal disease: Secondary | ICD-10-CM | POA: Diagnosis not present

## 2022-08-12 DIAGNOSIS — N189 Chronic kidney disease, unspecified: Secondary | ICD-10-CM | POA: Diagnosis not present

## 2022-08-12 DIAGNOSIS — R110 Nausea: Secondary | ICD-10-CM | POA: Diagnosis not present

## 2022-08-12 DIAGNOSIS — D509 Iron deficiency anemia, unspecified: Secondary | ICD-10-CM | POA: Diagnosis not present

## 2022-08-12 DIAGNOSIS — N2581 Secondary hyperparathyroidism of renal origin: Secondary | ICD-10-CM | POA: Diagnosis not present

## 2022-08-12 DIAGNOSIS — Z992 Dependence on renal dialysis: Secondary | ICD-10-CM | POA: Diagnosis not present

## 2022-08-13 ENCOUNTER — Other Ambulatory Visit (HOSPITAL_COMMUNITY): Payer: Self-pay

## 2022-08-14 DIAGNOSIS — N186 End stage renal disease: Secondary | ICD-10-CM | POA: Diagnosis not present

## 2022-08-14 DIAGNOSIS — N2581 Secondary hyperparathyroidism of renal origin: Secondary | ICD-10-CM | POA: Diagnosis not present

## 2022-08-14 DIAGNOSIS — E118 Type 2 diabetes mellitus with unspecified complications: Secondary | ICD-10-CM | POA: Diagnosis not present

## 2022-08-14 DIAGNOSIS — I10 Essential (primary) hypertension: Secondary | ICD-10-CM | POA: Diagnosis not present

## 2022-08-14 DIAGNOSIS — Z992 Dependence on renal dialysis: Secondary | ICD-10-CM | POA: Diagnosis not present

## 2022-08-14 DIAGNOSIS — N184 Chronic kidney disease, stage 4 (severe): Secondary | ICD-10-CM | POA: Diagnosis not present

## 2022-08-14 DIAGNOSIS — D509 Iron deficiency anemia, unspecified: Secondary | ICD-10-CM | POA: Diagnosis not present

## 2022-08-14 DIAGNOSIS — N189 Chronic kidney disease, unspecified: Secondary | ICD-10-CM | POA: Diagnosis not present

## 2022-08-14 DIAGNOSIS — L97519 Non-pressure chronic ulcer of other part of right foot with unspecified severity: Secondary | ICD-10-CM | POA: Diagnosis not present

## 2022-08-14 DIAGNOSIS — E1165 Type 2 diabetes mellitus with hyperglycemia: Secondary | ICD-10-CM | POA: Diagnosis not present

## 2022-08-14 DIAGNOSIS — R110 Nausea: Secondary | ICD-10-CM | POA: Diagnosis not present

## 2022-08-15 DIAGNOSIS — E46 Unspecified protein-calorie malnutrition: Secondary | ICD-10-CM | POA: Diagnosis not present

## 2022-08-15 DIAGNOSIS — Z5982 Transportation insecurity: Secondary | ICD-10-CM | POA: Diagnosis not present

## 2022-08-15 DIAGNOSIS — E1122 Type 2 diabetes mellitus with diabetic chronic kidney disease: Secondary | ICD-10-CM | POA: Diagnosis not present

## 2022-08-15 DIAGNOSIS — F32A Depression, unspecified: Secondary | ICD-10-CM | POA: Diagnosis not present

## 2022-08-15 DIAGNOSIS — Z604 Social exclusion and rejection: Secondary | ICD-10-CM | POA: Diagnosis not present

## 2022-08-15 DIAGNOSIS — I120 Hypertensive chronic kidney disease with stage 5 chronic kidney disease or end stage renal disease: Secondary | ICD-10-CM | POA: Diagnosis not present

## 2022-08-15 DIAGNOSIS — Z7982 Long term (current) use of aspirin: Secondary | ICD-10-CM | POA: Diagnosis not present

## 2022-08-15 DIAGNOSIS — E1142 Type 2 diabetes mellitus with diabetic polyneuropathy: Secondary | ICD-10-CM | POA: Diagnosis not present

## 2022-08-15 DIAGNOSIS — N186 End stage renal disease: Secondary | ICD-10-CM | POA: Diagnosis not present

## 2022-08-15 DIAGNOSIS — Z992 Dependence on renal dialysis: Secondary | ICD-10-CM | POA: Diagnosis not present

## 2022-08-15 DIAGNOSIS — E876 Hypokalemia: Secondary | ICD-10-CM | POA: Diagnosis not present

## 2022-08-15 DIAGNOSIS — E538 Deficiency of other specified B group vitamins: Secondary | ICD-10-CM | POA: Diagnosis not present

## 2022-08-15 DIAGNOSIS — I6621 Occlusion and stenosis of right posterior cerebral artery: Secondary | ICD-10-CM | POA: Diagnosis not present

## 2022-08-15 DIAGNOSIS — M5136 Other intervertebral disc degeneration, lumbar region: Secondary | ICD-10-CM | POA: Diagnosis not present

## 2022-08-15 DIAGNOSIS — Z9181 History of falling: Secondary | ICD-10-CM | POA: Diagnosis not present

## 2022-08-15 DIAGNOSIS — F0283 Dementia in other diseases classified elsewhere, unspecified severity, with mood disturbance: Secondary | ICD-10-CM | POA: Diagnosis not present

## 2022-08-15 DIAGNOSIS — Z7902 Long term (current) use of antithrombotics/antiplatelets: Secondary | ICD-10-CM | POA: Diagnosis not present

## 2022-08-15 DIAGNOSIS — E785 Hyperlipidemia, unspecified: Secondary | ICD-10-CM | POA: Diagnosis not present

## 2022-08-15 DIAGNOSIS — E559 Vitamin D deficiency, unspecified: Secondary | ICD-10-CM | POA: Diagnosis not present

## 2022-08-15 DIAGNOSIS — E11319 Type 2 diabetes mellitus with unspecified diabetic retinopathy without macular edema: Secondary | ICD-10-CM | POA: Diagnosis not present

## 2022-08-15 DIAGNOSIS — D696 Thrombocytopenia, unspecified: Secondary | ICD-10-CM | POA: Diagnosis not present

## 2022-08-15 DIAGNOSIS — D631 Anemia in chronic kidney disease: Secondary | ICD-10-CM | POA: Diagnosis not present

## 2022-08-15 DIAGNOSIS — F0153 Vascular dementia, unspecified severity, with mood disturbance: Secondary | ICD-10-CM | POA: Diagnosis not present

## 2022-08-17 ENCOUNTER — Encounter: Payer: Self-pay | Admitting: Hematology and Oncology

## 2022-08-17 DIAGNOSIS — D509 Iron deficiency anemia, unspecified: Secondary | ICD-10-CM | POA: Diagnosis not present

## 2022-08-17 DIAGNOSIS — N189 Chronic kidney disease, unspecified: Secondary | ICD-10-CM | POA: Diagnosis not present

## 2022-08-17 DIAGNOSIS — R110 Nausea: Secondary | ICD-10-CM | POA: Diagnosis not present

## 2022-08-17 DIAGNOSIS — N186 End stage renal disease: Secondary | ICD-10-CM | POA: Diagnosis not present

## 2022-08-17 DIAGNOSIS — Z992 Dependence on renal dialysis: Secondary | ICD-10-CM | POA: Diagnosis not present

## 2022-08-17 DIAGNOSIS — N2581 Secondary hyperparathyroidism of renal origin: Secondary | ICD-10-CM | POA: Diagnosis not present

## 2022-08-18 DIAGNOSIS — E1121 Type 2 diabetes mellitus with diabetic nephropathy: Secondary | ICD-10-CM | POA: Diagnosis not present

## 2022-08-18 DIAGNOSIS — E1165 Type 2 diabetes mellitus with hyperglycemia: Secondary | ICD-10-CM | POA: Diagnosis not present

## 2022-08-18 DIAGNOSIS — I120 Hypertensive chronic kidney disease with stage 5 chronic kidney disease or end stage renal disease: Secondary | ICD-10-CM | POA: Diagnosis not present

## 2022-08-18 DIAGNOSIS — R634 Abnormal weight loss: Secondary | ICD-10-CM | POA: Diagnosis not present

## 2022-08-18 DIAGNOSIS — D631 Anemia in chronic kidney disease: Secondary | ICD-10-CM | POA: Diagnosis not present

## 2022-08-18 DIAGNOSIS — I38 Endocarditis, valve unspecified: Secondary | ICD-10-CM | POA: Diagnosis not present

## 2022-08-18 DIAGNOSIS — Z992 Dependence on renal dialysis: Secondary | ICD-10-CM | POA: Diagnosis not present

## 2022-08-18 DIAGNOSIS — I10 Essential (primary) hypertension: Secondary | ICD-10-CM | POA: Diagnosis not present

## 2022-08-18 DIAGNOSIS — R011 Cardiac murmur, unspecified: Secondary | ICD-10-CM | POA: Diagnosis not present

## 2022-08-18 DIAGNOSIS — E1122 Type 2 diabetes mellitus with diabetic chronic kidney disease: Secondary | ICD-10-CM | POA: Diagnosis not present

## 2022-08-18 DIAGNOSIS — I6621 Occlusion and stenosis of right posterior cerebral artery: Secondary | ICD-10-CM | POA: Diagnosis not present

## 2022-08-18 DIAGNOSIS — I129 Hypertensive chronic kidney disease with stage 1 through stage 4 chronic kidney disease, or unspecified chronic kidney disease: Secondary | ICD-10-CM | POA: Diagnosis not present

## 2022-08-18 DIAGNOSIS — I639 Cerebral infarction, unspecified: Secondary | ICD-10-CM | POA: Diagnosis not present

## 2022-08-18 DIAGNOSIS — N184 Chronic kidney disease, stage 4 (severe): Secondary | ICD-10-CM | POA: Diagnosis not present

## 2022-08-18 DIAGNOSIS — N186 End stage renal disease: Secondary | ICD-10-CM | POA: Diagnosis not present

## 2022-08-19 DIAGNOSIS — N2581 Secondary hyperparathyroidism of renal origin: Secondary | ICD-10-CM | POA: Diagnosis not present

## 2022-08-19 DIAGNOSIS — Z992 Dependence on renal dialysis: Secondary | ICD-10-CM | POA: Diagnosis not present

## 2022-08-19 DIAGNOSIS — D509 Iron deficiency anemia, unspecified: Secondary | ICD-10-CM | POA: Diagnosis not present

## 2022-08-19 DIAGNOSIS — R110 Nausea: Secondary | ICD-10-CM | POA: Diagnosis not present

## 2022-08-19 DIAGNOSIS — N186 End stage renal disease: Secondary | ICD-10-CM | POA: Diagnosis not present

## 2022-08-19 DIAGNOSIS — N189 Chronic kidney disease, unspecified: Secondary | ICD-10-CM | POA: Diagnosis not present

## 2022-08-21 DIAGNOSIS — Z992 Dependence on renal dialysis: Secondary | ICD-10-CM | POA: Diagnosis not present

## 2022-08-21 DIAGNOSIS — R110 Nausea: Secondary | ICD-10-CM | POA: Diagnosis not present

## 2022-08-21 DIAGNOSIS — D509 Iron deficiency anemia, unspecified: Secondary | ICD-10-CM | POA: Diagnosis not present

## 2022-08-21 DIAGNOSIS — N2581 Secondary hyperparathyroidism of renal origin: Secondary | ICD-10-CM | POA: Diagnosis not present

## 2022-08-21 DIAGNOSIS — N189 Chronic kidney disease, unspecified: Secondary | ICD-10-CM | POA: Diagnosis not present

## 2022-08-21 DIAGNOSIS — N186 End stage renal disease: Secondary | ICD-10-CM | POA: Diagnosis not present

## 2022-08-24 DIAGNOSIS — N2581 Secondary hyperparathyroidism of renal origin: Secondary | ICD-10-CM | POA: Diagnosis not present

## 2022-08-24 DIAGNOSIS — N189 Chronic kidney disease, unspecified: Secondary | ICD-10-CM | POA: Diagnosis not present

## 2022-08-24 DIAGNOSIS — D509 Iron deficiency anemia, unspecified: Secondary | ICD-10-CM | POA: Diagnosis not present

## 2022-08-24 DIAGNOSIS — R110 Nausea: Secondary | ICD-10-CM | POA: Diagnosis not present

## 2022-08-24 DIAGNOSIS — E1129 Type 2 diabetes mellitus with other diabetic kidney complication: Secondary | ICD-10-CM | POA: Diagnosis not present

## 2022-08-24 DIAGNOSIS — Z992 Dependence on renal dialysis: Secondary | ICD-10-CM | POA: Diagnosis not present

## 2022-08-24 DIAGNOSIS — E1122 Type 2 diabetes mellitus with diabetic chronic kidney disease: Secondary | ICD-10-CM | POA: Diagnosis not present

## 2022-08-24 DIAGNOSIS — D631 Anemia in chronic kidney disease: Secondary | ICD-10-CM | POA: Diagnosis not present

## 2022-08-24 DIAGNOSIS — N186 End stage renal disease: Secondary | ICD-10-CM | POA: Diagnosis not present

## 2022-08-25 ENCOUNTER — Ambulatory Visit (INDEPENDENT_AMBULATORY_CARE_PROVIDER_SITE_OTHER): Payer: Medicare Other | Admitting: Podiatry

## 2022-08-25 DIAGNOSIS — M21961 Unspecified acquired deformity of right lower leg: Secondary | ICD-10-CM | POA: Diagnosis not present

## 2022-08-25 DIAGNOSIS — E1151 Type 2 diabetes mellitus with diabetic peripheral angiopathy without gangrene: Secondary | ICD-10-CM

## 2022-08-25 DIAGNOSIS — Z89419 Acquired absence of unspecified great toe: Secondary | ICD-10-CM | POA: Diagnosis not present

## 2022-08-25 DIAGNOSIS — L84 Corns and callosities: Secondary | ICD-10-CM | POA: Diagnosis not present

## 2022-08-25 NOTE — Progress Notes (Unsigned)
Subjective:  Patient ID: Bailey [name], female    DOB: [birth year],  MRN: [id number]  [name] presents to clinic today for:  Chief Complaint  Patient presents with   Foot Ulcer    Pt states she is here for the foot ulcer that she has on the bottom of her right foot that has been bothering for a couple weeks.    Patient states she was at dialysis recently and the support staff often inspect her feet.  They noticed a large callus which was discolored on the bottom of her right foot.  They strongly recommended she follow-up at our office for care.  She is diabetic.  She has had previous hallux amputations.  Denies any recent drainage.  She has not been following up with any specialist within the past 2 years for routine diabetic foot care.  PCP is Lewis Moccasin, MD. last seen 06/23/2022  Past Medical History:  Diagnosis Date   Anemia    patient preference - stopped iron    ARF (acute renal failure) (HCC) 04/2016   dehydration   Chronic kidney disease    stage 5   DDD (degenerative disc disease), lumbar    Depression    Diabetes mellitus    Type II - pt preference - stopped lantus   Diabetic retinopathy (HCC)    Diverticulitis    History of kidney stones    passed- 6   Hypertension    Lacunar infarction (HCC)    Osteomyelitis (HCC)    Polyneuropathy    Vitamin B 12 deficiency 06/09/2017   Vitamin D deficiency    Wears partial dentures    upper    Past Surgical History:  Procedure Laterality Date   AMPUTATION Right 08/14/2016   Procedure: RIGHT 1ST RAY AMPUTATION MID-SHAFT;  Surgeon: Nadara Mustard, MD;  Location: [facility] OR;  Service: Orthopedics;  Laterality: Right;   BASCILIC VEIN TRANSPOSITION Left 06/08/2019   Procedure: LEFT BRACHIOCEPHALIC VEIN CREATION;  Surgeon: [name], MD;  Location[facility] OR;  Service: Vascular;  Laterality: Left;   CATARACT EXTRACTION  2018   INCISION AND DRAINAGE ABSCESS Left 02/11/2014   Procedure: INCISION AND DRAINAGE  ABSCESS Left Buttock;  Surgeon: Avel Peace, MD;  Location: [facility] ORS;  Service: General;  Laterality: Left;   INSERTION OF DIALYSIS CATHETER Left 09/07/2019   Procedure: INSERTION OF LEFT INTERNAL JUGULAR DIALYSIS CATHETER;  Surgeon: Larina Earthly, MD;  Location: [facility] OR;  Service: Vascular;  Laterality: Left;   IR REMOVAL TUN CV CATH W/O FL  09/04/2019   LAPAROSCOPIC SMALL BOWEL RESECTION N/A 05/09/2016   Procedure: LAPAROSCOPIC SMALL BOWEL RESECTION;  Surgeon: Karie Soda, MD;  Location: [facility] ORS;  Service: General;  Laterality: N/A;   LAPAROSCOPY N/A 05/09/2016   Procedure: LAPAROSCOPY DIAGNOSTIC, LYSIS OF ADHESIONS, SMALL BOWEL RESECTION X 2;  Surgeon: Karie Soda, MD;  Location: [facility] ORS;  Service: General;  Laterality: N/A;   TOE AMPUTATION     left foot great toe    No Known Allergies  Objective:  There were no vitals filed for this visit.  Bailey [name] is a pleasant 72 y.o. female in NAD. AAO x 3.  Vascular Examination: Capillary refill time is delayed to toes bilateral.  DP pulse 2/4 bilateral, PT pulse 0/4 bilateral.  Digital hair sparse b/l. No pedal edema b/l. Skin temperature gradient WNL b/l. No varicosities b/l. No cyanosis or clubbing noted b/l.   Dermatological Examination: Pedal skin with normal turgor,  texture and tone b/l. No open wounds. No interdigital macerations b/l.   Hyperkeratotic lesion present, with minimal pain on palpation, located plantar aspect right midfoot near the medial cuneiform.  The hyperkeratosis is ecchymotic. Neurological Examination: Protective sensation diminished to plantar midfoot and forefoot bilateral.  Vibratory sensation absent bilateral.  Light touch sensation diminished.  Musculoskeletal Examination: Muscle strength 5/5 to all LE muscle groups b/l.  There is bony prominence on the plantar aspect of the right midfoot near the medial cuneiform area.  The left foot has more normal foot structure.     Latest Ref Rng & Units 07/11/2022     5:16 PM  Hemoglobin A1C  Hemoglobin-A1c 4.8 - 5.6 % 4.8    Assessment/Plan: 1. Type II diabetes mellitus with peripheral circulatory disorder (HCC)   2. History of amputation of hallux (HCC)     FOR HOME USE ONLY DME DIABETIC SHOE  The hyperkeratotic lesions were sharply debrided/shaved with sterile #313 blade.  Discussed with the patient what is causing the corns/calluses and reviewed treatment options today, including shaving the the painful lesion(s), off-loading techniques and pads, custom orthotics / shoe modifications.  If conservative treatment fails to provide enough relief, if chronic ulceration develops, or if the frequency of recurrence increases, then will recommend surgical intervention to help prevent the preulcerative lesion from recurring.   The patient was seen by our diabetic shoe department today for evaluation and measurement of feet for 1 pair of diabetic shoes with 3 pairs of custom insoles today.  Order written today.  She will need the plantar right midfoot bony prominence where she gets the callus, properly offloaded by the inserts.  Return in about 4 weeks (around 09/22/2022) for [facility] and recheck callus.   Clerance Lav, DPM, FACFAS [facility]     [address] [place], [place] [address]                Office [phone]  Fax [phone]

## 2022-08-25 NOTE — Progress Notes (Unsigned)
Patient presents today to  measured for diabetic shoes and insoles.  Patient was measured for  1 pair of diabetic shoes and 3 pairs of foam casted diabetic insoles.    Ht 5'4 Wt  107 Shoe size  Shoe type 983   Treating physician elizabeth dewey  Financial paperwork signed   Re-appointment for regularly scheduled diabetic foot care visits or if they should experience any trouble with the shoes or insoles.   l

## 2022-08-26 DIAGNOSIS — Z992 Dependence on renal dialysis: Secondary | ICD-10-CM | POA: Diagnosis not present

## 2022-08-26 DIAGNOSIS — D509 Iron deficiency anemia, unspecified: Secondary | ICD-10-CM | POA: Diagnosis not present

## 2022-08-26 DIAGNOSIS — N186 End stage renal disease: Secondary | ICD-10-CM | POA: Diagnosis not present

## 2022-08-26 DIAGNOSIS — N189 Chronic kidney disease, unspecified: Secondary | ICD-10-CM | POA: Diagnosis not present

## 2022-08-26 DIAGNOSIS — N2581 Secondary hyperparathyroidism of renal origin: Secondary | ICD-10-CM | POA: Diagnosis not present

## 2022-08-26 DIAGNOSIS — R110 Nausea: Secondary | ICD-10-CM | POA: Diagnosis not present

## 2022-08-28 DIAGNOSIS — Z992 Dependence on renal dialysis: Secondary | ICD-10-CM | POA: Diagnosis not present

## 2022-08-28 DIAGNOSIS — D509 Iron deficiency anemia, unspecified: Secondary | ICD-10-CM | POA: Diagnosis not present

## 2022-08-28 DIAGNOSIS — N2581 Secondary hyperparathyroidism of renal origin: Secondary | ICD-10-CM | POA: Diagnosis not present

## 2022-08-28 DIAGNOSIS — N189 Chronic kidney disease, unspecified: Secondary | ICD-10-CM | POA: Diagnosis not present

## 2022-08-28 DIAGNOSIS — N186 End stage renal disease: Secondary | ICD-10-CM | POA: Diagnosis not present

## 2022-08-28 DIAGNOSIS — R110 Nausea: Secondary | ICD-10-CM | POA: Diagnosis not present

## 2022-08-29 DIAGNOSIS — E1122 Type 2 diabetes mellitus with diabetic chronic kidney disease: Secondary | ICD-10-CM | POA: Diagnosis not present

## 2022-08-29 DIAGNOSIS — D631 Anemia in chronic kidney disease: Secondary | ICD-10-CM | POA: Diagnosis not present

## 2022-08-29 DIAGNOSIS — Z992 Dependence on renal dialysis: Secondary | ICD-10-CM | POA: Diagnosis not present

## 2022-08-29 DIAGNOSIS — N186 End stage renal disease: Secondary | ICD-10-CM | POA: Diagnosis not present

## 2022-08-29 DIAGNOSIS — I120 Hypertensive chronic kidney disease with stage 5 chronic kidney disease or end stage renal disease: Secondary | ICD-10-CM | POA: Diagnosis not present

## 2022-08-29 DIAGNOSIS — I6621 Occlusion and stenosis of right posterior cerebral artery: Secondary | ICD-10-CM | POA: Diagnosis not present

## 2022-08-31 ENCOUNTER — Other Ambulatory Visit: Payer: Self-pay | Admitting: Psychiatry

## 2022-08-31 DIAGNOSIS — N186 End stage renal disease: Secondary | ICD-10-CM | POA: Diagnosis not present

## 2022-08-31 DIAGNOSIS — Z992 Dependence on renal dialysis: Secondary | ICD-10-CM | POA: Diagnosis not present

## 2022-08-31 DIAGNOSIS — R110 Nausea: Secondary | ICD-10-CM | POA: Diagnosis not present

## 2022-08-31 DIAGNOSIS — D509 Iron deficiency anemia, unspecified: Secondary | ICD-10-CM | POA: Diagnosis not present

## 2022-08-31 DIAGNOSIS — N189 Chronic kidney disease, unspecified: Secondary | ICD-10-CM | POA: Diagnosis not present

## 2022-08-31 DIAGNOSIS — N2581 Secondary hyperparathyroidism of renal origin: Secondary | ICD-10-CM | POA: Diagnosis not present

## 2022-09-01 NOTE — Telephone Encounter (Signed)
Last seen on 12/22/21 per lab results " I'd like her to start a daily cholesterol medication called atorvastatin."  No 6 month follow up scheduled

## 2022-09-02 ENCOUNTER — Other Ambulatory Visit: Payer: Self-pay

## 2022-09-02 DIAGNOSIS — D509 Iron deficiency anemia, unspecified: Secondary | ICD-10-CM | POA: Diagnosis not present

## 2022-09-02 DIAGNOSIS — N189 Chronic kidney disease, unspecified: Secondary | ICD-10-CM | POA: Diagnosis not present

## 2022-09-02 DIAGNOSIS — R110 Nausea: Secondary | ICD-10-CM | POA: Diagnosis not present

## 2022-09-02 DIAGNOSIS — N2581 Secondary hyperparathyroidism of renal origin: Secondary | ICD-10-CM | POA: Diagnosis not present

## 2022-09-02 DIAGNOSIS — N186 End stage renal disease: Secondary | ICD-10-CM | POA: Diagnosis not present

## 2022-09-02 DIAGNOSIS — Z992 Dependence on renal dialysis: Secondary | ICD-10-CM | POA: Diagnosis not present

## 2022-09-02 DIAGNOSIS — I10 Essential (primary) hypertension: Secondary | ICD-10-CM

## 2022-09-03 ENCOUNTER — Telehealth: Payer: Self-pay | Admitting: *Deleted

## 2022-09-03 DIAGNOSIS — I120 Hypertensive chronic kidney disease with stage 5 chronic kidney disease or end stage renal disease: Secondary | ICD-10-CM | POA: Diagnosis not present

## 2022-09-03 DIAGNOSIS — D631 Anemia in chronic kidney disease: Secondary | ICD-10-CM | POA: Diagnosis not present

## 2022-09-03 DIAGNOSIS — Z992 Dependence on renal dialysis: Secondary | ICD-10-CM | POA: Diagnosis not present

## 2022-09-03 DIAGNOSIS — I6621 Occlusion and stenosis of right posterior cerebral artery: Secondary | ICD-10-CM | POA: Diagnosis not present

## 2022-09-03 DIAGNOSIS — E1122 Type 2 diabetes mellitus with diabetic chronic kidney disease: Secondary | ICD-10-CM | POA: Diagnosis not present

## 2022-09-03 DIAGNOSIS — N186 End stage renal disease: Secondary | ICD-10-CM | POA: Diagnosis not present

## 2022-09-03 NOTE — Progress Notes (Signed)
  Care Coordination  Outreach Note  09/03/2022 Name: ARIYAN SINNETT MRN: [id number] DOB: [birth year]   Care Coordination Outreach Attempts: An unsuccessful telephone outreach was attempted today to offer the patient information about available care coordination services.  Follow Up Plan:  Additional outreach attempts will be made to offer the patient care coordination information and services.   Encounter Outcome:  No Answer  Burman Nieves, CCMA Care Coordination Care Guide Direct Dial: [phone]

## 2022-09-04 DIAGNOSIS — N189 Chronic kidney disease, unspecified: Secondary | ICD-10-CM | POA: Diagnosis not present

## 2022-09-04 DIAGNOSIS — D509 Iron deficiency anemia, unspecified: Secondary | ICD-10-CM | POA: Diagnosis not present

## 2022-09-04 DIAGNOSIS — N2581 Secondary hyperparathyroidism of renal origin: Secondary | ICD-10-CM | POA: Diagnosis not present

## 2022-09-04 DIAGNOSIS — Z992 Dependence on renal dialysis: Secondary | ICD-10-CM | POA: Diagnosis not present

## 2022-09-04 DIAGNOSIS — R110 Nausea: Secondary | ICD-10-CM | POA: Diagnosis not present

## 2022-09-04 DIAGNOSIS — N186 End stage renal disease: Secondary | ICD-10-CM | POA: Diagnosis not present

## 2022-09-04 NOTE — Progress Notes (Signed)
  Care Coordination  Outreach Note  09/04/2022 Name: Bailey [name] MRN: [id number] DOB: [birth year]   Care Coordination Outreach Attempts: A second unsuccessful outreach was attempted today to offer the patient with information about available care coordination services.  Follow Up Plan:  Additional outreach attempts will be made to offer the patient care coordination information and services.   Encounter Outcome:  No Answer  Burman Nieves, CCMA Care Coordination Care Guide Direct Dial: [phone]

## 2022-09-07 DIAGNOSIS — D509 Iron deficiency anemia, unspecified: Secondary | ICD-10-CM | POA: Diagnosis not present

## 2022-09-07 DIAGNOSIS — N2581 Secondary hyperparathyroidism of renal origin: Secondary | ICD-10-CM | POA: Diagnosis not present

## 2022-09-07 DIAGNOSIS — N186 End stage renal disease: Secondary | ICD-10-CM | POA: Diagnosis not present

## 2022-09-07 DIAGNOSIS — R110 Nausea: Secondary | ICD-10-CM | POA: Diagnosis not present

## 2022-09-07 DIAGNOSIS — Z992 Dependence on renal dialysis: Secondary | ICD-10-CM | POA: Diagnosis not present

## 2022-09-07 DIAGNOSIS — N189 Chronic kidney disease, unspecified: Secondary | ICD-10-CM | POA: Diagnosis not present

## 2022-09-07 NOTE — Progress Notes (Signed)
  Care Coordination  Outreach Note  09/07/2022 Name: FRADEL BALDONADO MRN: [id number] DOB: 04/08/1950   Care Coordination Outreach Attempts: A third unsuccessful outreach was attempted today to offer the patient with information about available care coordination services.  Follow Up Plan:  No further outreach attempts will be made at this time. We have been unable to contact the patient to offer or enroll patient in care coordination services  Encounter Outcome:  No Answer  Burman Nieves, [facility] Care Coordination Care Guide Direct Dial: [phone]

## 2022-09-08 DIAGNOSIS — Z992 Dependence on renal dialysis: Secondary | ICD-10-CM | POA: Diagnosis not present

## 2022-09-08 DIAGNOSIS — D631 Anemia in chronic kidney disease: Secondary | ICD-10-CM | POA: Diagnosis not present

## 2022-09-08 DIAGNOSIS — I120 Hypertensive chronic kidney disease with stage 5 chronic kidney disease or end stage renal disease: Secondary | ICD-10-CM | POA: Diagnosis not present

## 2022-09-08 DIAGNOSIS — N186 End stage renal disease: Secondary | ICD-10-CM | POA: Diagnosis not present

## 2022-09-08 DIAGNOSIS — I6621 Occlusion and stenosis of right posterior cerebral artery: Secondary | ICD-10-CM | POA: Diagnosis not present

## 2022-09-08 DIAGNOSIS — E1122 Type 2 diabetes mellitus with diabetic chronic kidney disease: Secondary | ICD-10-CM | POA: Diagnosis not present

## 2022-09-09 DIAGNOSIS — D509 Iron deficiency anemia, unspecified: Secondary | ICD-10-CM | POA: Diagnosis not present

## 2022-09-09 DIAGNOSIS — Z992 Dependence on renal dialysis: Secondary | ICD-10-CM | POA: Diagnosis not present

## 2022-09-09 DIAGNOSIS — N186 End stage renal disease: Secondary | ICD-10-CM | POA: Diagnosis not present

## 2022-09-09 DIAGNOSIS — R110 Nausea: Secondary | ICD-10-CM | POA: Diagnosis not present

## 2022-09-09 DIAGNOSIS — N2581 Secondary hyperparathyroidism of renal origin: Secondary | ICD-10-CM | POA: Diagnosis not present

## 2022-09-09 DIAGNOSIS — N189 Chronic kidney disease, unspecified: Secondary | ICD-10-CM | POA: Diagnosis not present

## 2022-09-11 DIAGNOSIS — N189 Chronic kidney disease, unspecified: Secondary | ICD-10-CM | POA: Diagnosis not present

## 2022-09-11 DIAGNOSIS — N186 End stage renal disease: Secondary | ICD-10-CM | POA: Diagnosis not present

## 2022-09-11 DIAGNOSIS — Z992 Dependence on renal dialysis: Secondary | ICD-10-CM | POA: Diagnosis not present

## 2022-09-11 DIAGNOSIS — R110 Nausea: Secondary | ICD-10-CM | POA: Diagnosis not present

## 2022-09-11 DIAGNOSIS — N2581 Secondary hyperparathyroidism of renal origin: Secondary | ICD-10-CM | POA: Diagnosis not present

## 2022-09-11 DIAGNOSIS — D509 Iron deficiency anemia, unspecified: Secondary | ICD-10-CM | POA: Diagnosis not present

## 2022-09-16 DIAGNOSIS — Z992 Dependence on renal dialysis: Secondary | ICD-10-CM | POA: Diagnosis not present

## 2022-09-16 DIAGNOSIS — D509 Iron deficiency anemia, unspecified: Secondary | ICD-10-CM | POA: Diagnosis not present

## 2022-09-16 DIAGNOSIS — R110 Nausea: Secondary | ICD-10-CM | POA: Diagnosis not present

## 2022-09-16 DIAGNOSIS — N186 End stage renal disease: Secondary | ICD-10-CM | POA: Diagnosis not present

## 2022-09-16 DIAGNOSIS — N2581 Secondary hyperparathyroidism of renal origin: Secondary | ICD-10-CM | POA: Diagnosis not present

## 2022-09-16 DIAGNOSIS — N189 Chronic kidney disease, unspecified: Secondary | ICD-10-CM | POA: Diagnosis not present

## 2022-09-17 DIAGNOSIS — E1165 Type 2 diabetes mellitus with hyperglycemia: Secondary | ICD-10-CM | POA: Diagnosis not present

## 2022-09-17 DIAGNOSIS — E118 Type 2 diabetes mellitus with unspecified complications: Secondary | ICD-10-CM | POA: Diagnosis not present

## 2022-09-17 DIAGNOSIS — E039 Hypothyroidism, unspecified: Secondary | ICD-10-CM | POA: Diagnosis not present

## 2022-09-17 DIAGNOSIS — I10 Essential (primary) hypertension: Secondary | ICD-10-CM | POA: Diagnosis not present

## 2022-09-18 DIAGNOSIS — N2581 Secondary hyperparathyroidism of renal origin: Secondary | ICD-10-CM | POA: Diagnosis not present

## 2022-09-18 DIAGNOSIS — R110 Nausea: Secondary | ICD-10-CM | POA: Diagnosis not present

## 2022-09-18 DIAGNOSIS — N189 Chronic kidney disease, unspecified: Secondary | ICD-10-CM | POA: Diagnosis not present

## 2022-09-18 DIAGNOSIS — N186 End stage renal disease: Secondary | ICD-10-CM | POA: Diagnosis not present

## 2022-09-18 DIAGNOSIS — D509 Iron deficiency anemia, unspecified: Secondary | ICD-10-CM | POA: Diagnosis not present

## 2022-09-18 DIAGNOSIS — Z992 Dependence on renal dialysis: Secondary | ICD-10-CM | POA: Diagnosis not present

## 2022-09-21 DIAGNOSIS — D509 Iron deficiency anemia, unspecified: Secondary | ICD-10-CM | POA: Diagnosis not present

## 2022-09-21 DIAGNOSIS — R110 Nausea: Secondary | ICD-10-CM | POA: Diagnosis not present

## 2022-09-21 DIAGNOSIS — N189 Chronic kidney disease, unspecified: Secondary | ICD-10-CM | POA: Diagnosis not present

## 2022-09-21 DIAGNOSIS — Z992 Dependence on renal dialysis: Secondary | ICD-10-CM | POA: Diagnosis not present

## 2022-09-21 DIAGNOSIS — N2581 Secondary hyperparathyroidism of renal origin: Secondary | ICD-10-CM | POA: Diagnosis not present

## 2022-09-21 DIAGNOSIS — N186 End stage renal disease: Secondary | ICD-10-CM | POA: Diagnosis not present

## 2022-09-24 DIAGNOSIS — Z992 Dependence on renal dialysis: Secondary | ICD-10-CM | POA: Diagnosis not present

## 2022-09-24 DIAGNOSIS — N186 End stage renal disease: Secondary | ICD-10-CM | POA: Diagnosis not present

## 2022-09-24 DIAGNOSIS — E1129 Type 2 diabetes mellitus with other diabetic kidney complication: Secondary | ICD-10-CM | POA: Diagnosis not present

## 2022-09-25 DIAGNOSIS — E1122 Type 2 diabetes mellitus with diabetic chronic kidney disease: Secondary | ICD-10-CM | POA: Diagnosis not present

## 2022-09-25 DIAGNOSIS — N186 End stage renal disease: Secondary | ICD-10-CM | POA: Diagnosis not present

## 2022-09-25 DIAGNOSIS — Z992 Dependence on renal dialysis: Secondary | ICD-10-CM | POA: Diagnosis not present

## 2022-09-25 DIAGNOSIS — N2581 Secondary hyperparathyroidism of renal origin: Secondary | ICD-10-CM | POA: Diagnosis not present

## 2022-09-25 DIAGNOSIS — D631 Anemia in chronic kidney disease: Secondary | ICD-10-CM | POA: Diagnosis not present

## 2022-09-25 DIAGNOSIS — N189 Chronic kidney disease, unspecified: Secondary | ICD-10-CM | POA: Diagnosis not present

## 2022-09-25 DIAGNOSIS — D509 Iron deficiency anemia, unspecified: Secondary | ICD-10-CM | POA: Diagnosis not present

## 2022-09-28 DIAGNOSIS — Z992 Dependence on renal dialysis: Secondary | ICD-10-CM | POA: Diagnosis not present

## 2022-09-28 DIAGNOSIS — N189 Chronic kidney disease, unspecified: Secondary | ICD-10-CM | POA: Diagnosis not present

## 2022-09-28 DIAGNOSIS — N2581 Secondary hyperparathyroidism of renal origin: Secondary | ICD-10-CM | POA: Diagnosis not present

## 2022-09-28 DIAGNOSIS — D631 Anemia in chronic kidney disease: Secondary | ICD-10-CM | POA: Diagnosis not present

## 2022-09-28 DIAGNOSIS — N186 End stage renal disease: Secondary | ICD-10-CM | POA: Diagnosis not present

## 2022-09-28 DIAGNOSIS — E1122 Type 2 diabetes mellitus with diabetic chronic kidney disease: Secondary | ICD-10-CM | POA: Diagnosis not present

## 2022-09-29 ENCOUNTER — Encounter: Payer: Medicare Other | Admitting: Podiatry

## 2022-09-29 DIAGNOSIS — E118 Type 2 diabetes mellitus with unspecified complications: Secondary | ICD-10-CM | POA: Diagnosis not present

## 2022-09-29 DIAGNOSIS — E11319 Type 2 diabetes mellitus with unspecified diabetic retinopathy without macular edema: Secondary | ICD-10-CM | POA: Diagnosis not present

## 2022-09-29 DIAGNOSIS — I10 Essential (primary) hypertension: Secondary | ICD-10-CM | POA: Diagnosis not present

## 2022-09-29 DIAGNOSIS — R7303 Prediabetes: Secondary | ICD-10-CM | POA: Diagnosis not present

## 2022-09-30 ENCOUNTER — Encounter: Payer: Self-pay | Admitting: Hematology and Oncology

## 2022-09-30 ENCOUNTER — Inpatient Hospital Stay (HOSPITAL_COMMUNITY)
Admission: EM | Admit: 2022-09-30 | Discharge: 2022-10-02 | DRG: 070 | Disposition: A | Discharge status: Home / Self Care | Payer: Medicare Other | Attending: Internal Medicine | Admitting: Internal Medicine

## 2022-09-30 ENCOUNTER — Encounter (HOSPITAL_COMMUNITY): Payer: Self-pay | Admitting: Internal Medicine

## 2022-09-30 ENCOUNTER — Emergency Department (HOSPITAL_COMMUNITY): Payer: Medicare Other

## 2022-09-30 ENCOUNTER — Observation Stay (HOSPITAL_COMMUNITY): Payer: Medicare Other

## 2022-09-30 ENCOUNTER — Other Ambulatory Visit: Payer: Self-pay

## 2022-09-30 DIAGNOSIS — I10 Essential (primary) hypertension: Secondary | ICD-10-CM | POA: Diagnosis present

## 2022-09-30 DIAGNOSIS — Z79899 Other long term (current) drug therapy: Secondary | ICD-10-CM

## 2022-09-30 DIAGNOSIS — N186 End stage renal disease: Secondary | ICD-10-CM | POA: Diagnosis not present

## 2022-09-30 DIAGNOSIS — F32A Depression, unspecified: Secondary | ICD-10-CM | POA: Diagnosis present

## 2022-09-30 DIAGNOSIS — R0989 Other specified symptoms and signs involving the circulatory and respiratory systems: Secondary | ICD-10-CM

## 2022-09-30 DIAGNOSIS — Z7902 Long term (current) use of antithrombotics/antiplatelets: Secondary | ICD-10-CM

## 2022-09-30 DIAGNOSIS — M898X9 Other specified disorders of bone, unspecified site: Secondary | ICD-10-CM | POA: Diagnosis present

## 2022-09-30 DIAGNOSIS — R0689 Other abnormalities of breathing: Secondary | ICD-10-CM | POA: Diagnosis not present

## 2022-09-30 DIAGNOSIS — N390 Urinary tract infection, site not specified: Secondary | ICD-10-CM | POA: Diagnosis not present

## 2022-09-30 DIAGNOSIS — E11319 Type 2 diabetes mellitus with unspecified diabetic retinopathy without macular edema: Secondary | ICD-10-CM | POA: Diagnosis not present

## 2022-09-30 DIAGNOSIS — Z833 Family history of diabetes mellitus: Secondary | ICD-10-CM

## 2022-09-30 DIAGNOSIS — I120 Hypertensive chronic kidney disease with stage 5 chronic kidney disease or end stage renal disease: Secondary | ICD-10-CM | POA: Diagnosis present

## 2022-09-30 DIAGNOSIS — Z87442 Personal history of urinary calculi: Secondary | ICD-10-CM

## 2022-09-30 DIAGNOSIS — G9341 Metabolic encephalopathy: Secondary | ICD-10-CM | POA: Diagnosis not present

## 2022-09-30 DIAGNOSIS — I6782 Cerebral ischemia: Secondary | ICD-10-CM | POA: Diagnosis not present

## 2022-09-30 DIAGNOSIS — M549 Dorsalgia, unspecified: Secondary | ICD-10-CM | POA: Diagnosis not present

## 2022-09-30 DIAGNOSIS — E785 Hyperlipidemia, unspecified: Secondary | ICD-10-CM | POA: Insufficient documentation

## 2022-09-30 DIAGNOSIS — Z992 Dependence on renal dialysis: Secondary | ICD-10-CM

## 2022-09-30 DIAGNOSIS — I959 Hypotension, unspecified: Secondary | ICD-10-CM | POA: Diagnosis not present

## 2022-09-30 DIAGNOSIS — D631 Anemia in chronic kidney disease: Secondary | ICD-10-CM | POA: Diagnosis not present

## 2022-09-30 DIAGNOSIS — R9431 Abnormal electrocardiogram [ECG] [EKG]: Secondary | ICD-10-CM | POA: Diagnosis not present

## 2022-09-30 DIAGNOSIS — Z89431 Acquired absence of right foot: Secondary | ICD-10-CM

## 2022-09-30 DIAGNOSIS — R8281 Pyuria: Secondary | ICD-10-CM | POA: Diagnosis present

## 2022-09-30 DIAGNOSIS — G4489 Other headache syndrome: Secondary | ICD-10-CM | POA: Diagnosis not present

## 2022-09-30 DIAGNOSIS — Z825 Family history of asthma and other chronic lower respiratory diseases: Secondary | ICD-10-CM

## 2022-09-30 DIAGNOSIS — R4182 Altered mental status, unspecified: Secondary | ICD-10-CM | POA: Diagnosis not present

## 2022-09-30 DIAGNOSIS — R29818 Other symptoms and signs involving the nervous system: Secondary | ICD-10-CM | POA: Diagnosis not present

## 2022-09-30 DIAGNOSIS — E1122 Type 2 diabetes mellitus with diabetic chronic kidney disease: Secondary | ICD-10-CM | POA: Diagnosis present

## 2022-09-30 DIAGNOSIS — Z7982 Long term (current) use of aspirin: Secondary | ICD-10-CM

## 2022-09-30 DIAGNOSIS — Z8673 Personal history of transient ischemic attack (TIA), and cerebral infarction without residual deficits: Secondary | ICD-10-CM

## 2022-09-30 DIAGNOSIS — I69398 Other sequelae of cerebral infarction: Secondary | ICD-10-CM

## 2022-09-30 LAB — HEPATITIS B SURFACE ANTIGEN: Hepatitis B Surface Ag: NONREACTIVE

## 2022-09-30 LAB — URINALYSIS, ROUTINE W REFLEX MICROSCOPIC
Bilirubin Urine: NEGATIVE
Glucose, UA: >=500 mg/dL — AB
Hgb urine dipstick: NEGATIVE
Ketones, ur: NEGATIVE mg/dL
Nitrite: NEGATIVE
Protein, ur: >=300 mg/dL — AB
Specific Gravity, Urine: 1.009 (ref 1.005–1.030)
WBC, UA: >50 WBC/hpf (ref 0–5)
pH: 9 — ABNORMAL HIGH (ref 5.0–8.0)

## 2022-09-30 LAB — DIFFERENTIAL
Abs Immature Granulocytes: 0.03 10*3/uL (ref 0.00–0.07)
Basophils Absolute: 0 10*3/uL (ref 0.0–0.1)
Basophils Relative: 1 %
Eosinophils Absolute: 0.3 10*3/uL (ref 0.0–0.5)
Eosinophils Relative: 4 %
Immature Granulocytes: 1 %
Lymphocytes Relative: 34 %
Lymphs Abs: 2.2 10*3/uL (ref 0.7–4.0)
Monocytes Absolute: 0.6 10*3/uL (ref 0.1–1.0)
Monocytes Relative: 10 %
Neutro Abs: 3.4 10*3/uL (ref 1.7–7.7)
Neutrophils Relative %: 50 %

## 2022-09-30 LAB — CBC
HCT: 37.9 % (ref 36.0–46.0)
Hemoglobin: 11.9 g/dL — ABNORMAL LOW (ref 12.0–15.0)
MCH: 33.2 pg (ref 26.0–34.0)
MCHC: 31.4 g/dL (ref 30.0–36.0)
MCV: 105.9 fL — ABNORMAL HIGH (ref 80.0–100.0)
Platelets: 97 10*3/uL — ABNORMAL LOW (ref 150–400)
RBC: 3.58 MIL/uL — ABNORMAL LOW (ref 3.87–5.11)
RDW: 14.1 % (ref 11.5–15.5)
WBC: 6.6 10*3/uL (ref 4.0–10.5)
nRBC: 0 % (ref 0.0–0.2)

## 2022-09-30 LAB — COMPREHENSIVE METABOLIC PANEL
ALT: 13 U/L (ref 0–44)
AST: 20 U/L (ref 15–41)
Albumin: 3.5 g/dL (ref 3.5–5.0)
Alkaline Phosphatase: 104 U/L (ref 38–126)
Anion gap: 18 — ABNORMAL HIGH (ref 5–15)
BUN: 47 mg/dL — ABNORMAL HIGH (ref 8–23)
CO2: 25 mmol/L (ref 22–32)
Calcium: 9.1 mg/dL (ref 8.9–10.3)
Chloride: 94 mmol/L — ABNORMAL LOW (ref 98–111)
Creatinine, Ser: 5.5 mg/dL — ABNORMAL HIGH (ref 0.44–1.00)
GFR, Estimated: 8 mL/min — ABNORMAL LOW (ref >60)
Glucose, Bld: 94 mg/dL (ref 70–99)
Potassium: 4.4 mmol/L (ref 3.5–5.1)
Sodium: 137 mmol/L (ref 135–145)
Total Bilirubin: 0.5 mg/dL (ref 0.3–1.2)
Total Protein: 6.9 g/dL (ref 6.5–8.1)

## 2022-09-30 LAB — PROTIME-INR
INR: 1.1 (ref 0.8–1.2)
Prothrombin Time: 14.6 seconds (ref 11.4–15.2)

## 2022-09-30 LAB — I-STAT CHEM 8, ED
BUN: 47 mg/dL — ABNORMAL HIGH (ref 8–23)
Calcium, Ion: 1 mmol/L — ABNORMAL LOW (ref 1.15–1.40)
Chloride: 100 mmol/L (ref 98–111)
Creatinine, Ser: 6.4 mg/dL — ABNORMAL HIGH (ref 0.44–1.00)
Glucose, Bld: 97 mg/dL (ref 70–99)
HCT: 36 % (ref 36.0–46.0)
Hemoglobin: 12.2 g/dL (ref 12.0–15.0)
Potassium: 4.4 mmol/L (ref 3.5–5.1)
Sodium: 139 mmol/L (ref 135–145)
TCO2: 30 mmol/L (ref 22–32)

## 2022-09-30 LAB — APTT: aPTT: 30 seconds (ref 24–36)

## 2022-09-30 LAB — ETHANOL: Alcohol, Ethyl (B): <10 mg/dL (ref <10)

## 2022-09-30 LAB — CBG MONITORING, ED: Glucose-Capillary: 104 mg/dL — ABNORMAL HIGH (ref 70–99)

## 2022-09-30 MED ORDER — CHLORHEXIDINE GLUCONATE CLOTH 2 % EX PADS
6.0000 | MEDICATED_PAD | Freq: Every day | CUTANEOUS | Status: DC
  Administered 2022-10-02: 6 via TOPICAL

## 2022-09-30 MED ORDER — SODIUM CHLORIDE 0.9% FLUSH
3.0000 mL | Freq: Two times a day (BID) | INTRAVENOUS | Status: DC
  Administered 2022-09-30 – 2022-10-02 (×4): 3 mL via INTRAVENOUS

## 2022-09-30 MED ORDER — SODIUM CHLORIDE 0.9% FLUSH: 3.0000 mL | INTRAVENOUS | Status: DC | PRN

## 2022-09-30 MED ORDER — ACETAMINOPHEN 325 MG PO TABS: 650.0000 mg | ORAL_TABLET | Freq: Four times a day (QID) | ORAL | Status: DC | PRN

## 2022-09-30 MED ORDER — SODIUM CHLORIDE 0.9% FLUSH: 3.0000 mL | Freq: Two times a day (BID) | INTRAVENOUS | Status: DC

## 2022-09-30 MED ORDER — IRBESARTAN 150 MG PO TABS
150.0000 mg | ORAL_TABLET | Freq: Every day | ORAL | Status: DC
  Administered 2022-10-01 – 2022-10-02 (×2): 150 mg via ORAL
  Filled 2022-09-30 (×2): qty 1

## 2022-09-30 MED ORDER — ATORVASTATIN CALCIUM 40 MG PO TABS
40.0000 mg | ORAL_TABLET | Freq: Every day | ORAL | Status: DC
  Administered 2022-10-01 – 2022-10-02 (×2): 40 mg via ORAL
  Filled 2022-09-30 (×2): qty 1

## 2022-09-30 MED ORDER — SODIUM CHLORIDE 0.9% FLUSH: 3.0000 mL | Freq: Once | INTRAVENOUS | Status: DC

## 2022-09-30 MED ORDER — ONDANSETRON HCL 4 MG PO TABS: 4.0000 mg | ORAL_TABLET | Freq: Four times a day (QID) | ORAL | Status: DC | PRN

## 2022-09-30 MED ORDER — AMLODIPINE BESYLATE 5 MG PO TABS
5.0000 mg | ORAL_TABLET | Freq: Every day | ORAL | Status: DC
  Administered 2022-09-30 – 2022-10-01 (×2): 5 mg via ORAL
  Filled 2022-09-30 (×2): qty 1

## 2022-09-30 MED ORDER — SODIUM CHLORIDE 0.9 % IV SOLN
1.0000 g | Freq: Once | INTRAVENOUS | Status: AC
  Administered 2022-09-30: 1 g via INTRAVENOUS
  Filled 2022-09-30: qty 10

## 2022-09-30 MED ORDER — ONDANSETRON HCL 4 MG/2ML IJ SOLN: 4.0000 mg | Freq: Four times a day (QID) | INTRAMUSCULAR | Status: DC | PRN

## 2022-09-30 MED ORDER — SODIUM CHLORIDE 0.9 % IV SOLN: 250.0000 mL | INTRAVENOUS | Status: DC | PRN

## 2022-09-30 MED ORDER — HEPARIN SODIUM (PORCINE) 5000 UNIT/ML IJ SOLN
5000.0000 [IU] | Freq: Three times a day (TID) | INTRAMUSCULAR | Status: DC
  Administered 2022-09-30 – 2022-10-02 (×5): 5000 [IU] via SUBCUTANEOUS
  Filled 2022-09-30 (×5): qty 1

## 2022-09-30 MED ORDER — POLYETHYLENE GLYCOL 3350 17 G PO PACK: 17.0000 g | PACK | Freq: Every day | ORAL | Status: DC | PRN

## 2022-09-30 MED ORDER — CLOPIDOGREL BISULFATE 75 MG PO TABS: 75.0000 mg | ORAL_TABLET | Freq: Every day | ORAL | Status: DC

## 2022-09-30 MED ORDER — ASPIRIN 81 MG PO TBEC: 81.0000 mg | DELAYED_RELEASE_TABLET | Freq: Every day | ORAL | Status: DC

## 2022-09-30 MED ORDER — SODIUM CHLORIDE 0.9 % IV SOLN
1.0000 g | INTRAVENOUS | Status: AC
  Administered 2022-10-01 – 2022-10-02 (×2): 1 g via INTRAVENOUS
  Filled 2022-09-30 (×2): qty 10

## 2022-09-30 MED ORDER — ACETAMINOPHEN 650 MG RE SUPP: 650.0000 mg | Freq: Four times a day (QID) | RECTAL | Status: DC | PRN

## 2022-09-30 NOTE — ED Provider Notes (Signed)
[facility] AT [facility] Provider Note   CSN: [id number] Arrival date & time: 09/30/22  4098     History  Chief Complaint  Patient presents with   Altered Mental Status    Bailey [name] is a 72 y.o. female.  The history is provided by the patient, the spouse, the EMS personnel and medical records. No language interpreter was used.     72 year old-year-old with multiple comorbidities which include end-stage renal disease currently on Monday Wednesday Friday dialysis schedule, diabetes,  prior stroke currently on Plavix brought here via EMS from home for evaluation of confusion.  Per EMS note, patient last known normal was 9 PM last night but this morning when she woke up she was unable to recognize her husband and also endorsed having headache.  Shortly after that she was having trouble communicate with her husband.  When EMS arrived, patient became more alert and oriented.  She is scheduled to have her dialysis today.  At this time patient complaining of feeling confused.  She does not know why she is in the hospital.  She is without any significant complaints such as chest pain shortness of breath abdominal pain new focal numbness a few focal weakness.  Home Medications Prior to Admission medications   Medication Sig Start Date End Date Taking? Authorizing Provider  amLODipine (NORVASC) 5 MG tablet Take 1 tablet (5 mg total) by mouth at bedtime. 07/14/22   Lyndle Herrlich, MD  aspirin EC 81 MG tablet Take 1 tablet (81 mg total) by mouth daily. Swallow whole. 07/14/22   Lyndle Herrlich, MD  atorvastatin (LIPITOR) 40 MG tablet TAKE 1 TABLET(40 MG) BY MOUTH DAILY 09/01/22   Ocie Doyne, MD  AURYXIA 1 GM 210 MG(Fe) tablet Take 210 mg by mouth 3 (three) times daily. 02/06/21   [provider]  clopidogrel (PLAVIX) 75 MG tablet Take 1 tablet (75 mg total) by mouth daily. 07/29/22     donepezil (ARICEPT) 5 MG tablet Take 1/2 pill daily for  4 weeks, then increase to 1 pill daily 12/22/21   Ocie Doyne, MD  multivitamin (RENA-VIT) TABS tablet Take 1 tablet by mouth daily.    [provider]  olmesartan (BENICAR) 40 MG tablet Take 1/2 tablet (20 mg total) by mouth daily. 07/29/22         Allergies    Patient has no known allergies.    Review of Systems   Review of Systems  All other systems reviewed and are negative.   Physical Exam Updated Vital Signs BP (!) 179/69   Pulse 70   Temp 97.6 F (36.4 C) (Oral)   Resp 12   LMP  (LMP Unknown)   SpO2 100%  Physical Exam Vitals and nursing note reviewed.  Constitutional:      General: She is not in acute distress.    Appearance: She is well-developed.     Comments: Elderly female, appears bit distraught but in no acute discomfort.  HENT:     Head: Atraumatic.  Eyes:     Conjunctiva/sclera: Conjunctivae normal.  Cardiovascular:     Rate and Rhythm: Normal rate and regular rhythm.     Pulses: Normal pulses.     Heart sounds: Normal heart sounds.  Pulmonary:     Effort: Pulmonary effort is normal.  Abdominal:     Palpations: Abdomen is soft.     Tenderness: There is no abdominal tenderness.  Musculoskeletal:     Cervical back: Neck supple.  Skin:    Findings: No rash.  Neurological:     Mental Status: She is alert. She is disoriented.     GCS: GCS eye subscore is 4. GCS verbal subscore is 5. GCS motor subscore is 6.     Cranial Nerves: Cranial nerves 2-12 are intact.     Sensory: Sensation is intact.     Motor: Motor function is intact.     Coordination: Coordination is intact.     Comments: Alert oriented x 2 Subjective decrease sensation to right side of face compared to left without any facial droop.  Decreased grip strength to left hand compared to right.  Decreased strength of right lower extremity compared to left  Psychiatric:        Mood and Affect: Mood normal.     ED Results / Procedures / Treatments   Labs (all labs ordered are  listed, but only abnormal results are displayed) Labs Reviewed  CBC - Abnormal; Notable for the following components:      Result Value   RBC 3.58 (*)    Hemoglobin 11.9 (*)    MCV 105.9 (*)    Platelets 97 (*)    All other components within normal limits  COMPREHENSIVE METABOLIC PANEL - Abnormal; Notable for the following components:   Chloride 94 (*)    BUN 47 (*)    Creatinine, Ser 5.50 (*)    GFR, Estimated 8 (*)    Anion gap 18 (*)    All other components within normal limits  URINALYSIS, ROUTINE W REFLEX MICROSCOPIC - Abnormal; Notable for the following components:   APPearance HAZY (*)    pH 9.0 (*)    Glucose, UA >=500 (*)    Protein, ur >=300 (*)    Leukocytes,Ua LARGE (*)    Bacteria, UA RARE (*)    All other components within normal limits  CBG MONITORING, ED - Abnormal; Notable for the following components:   Glucose-Capillary 104 (*)    All other components within normal limits  I-STAT CHEM 8, ED - Abnormal; Notable for the following components:   BUN 47 (*)    Creatinine, Ser 6.40 (*)    Calcium, Ion 1.00 (*)    All other components within normal limits  URINE CULTURE  PROTIME-INR  APTT  DIFFERENTIAL  ETHANOL  CBG MONITORING, ED    EKG EKG Interpretation Date/Time:  Wednesday September 30 2022 09:56:38 EDT Ventricular Rate:  73 PR Interval:  137 QRS Duration:  101 QT Interval:  434 QTC Calculation: 479 R Axis:   -16  Text Interpretation: Sinus rhythm LVH with secondary repolarization abnormality Confirmed by Alvester Chou [phone]) on 09/30/2022 10:02:43 AM  Radiology CT HEAD WO CONTRAST  Result Date: 09/30/2022 CLINICAL DATA:  Stroke suspected EXAM: CT HEAD WITHOUT CONTRAST TECHNIQUE: Contiguous axial images were obtained from the base of the skull through the vertex without intravenous contrast. RADIATION DOSE REDUCTION: This exam was performed according to the departmental dose-optimization program which includes automated exposure control, adjustment  of the mA and/or kV according to patient size and/or use of iterative reconstruction technique. COMPARISON:  07/11/2022 FINDINGS: Brain: No evidence of acute infarction, hemorrhage, hydrocephalus, extra-axial collection or mass lesion/mass effect. Patchy low-density changes within the periventricular and subcortical white matter most compatible with chronic microvascular ischemic change. Similar diffuse cerebral volume loss. Vascular: Atherosclerotic calcifications involving the large vessels of the skull base. No unexpected hyperdense vessel. Skull: Normal. Negative for fracture or focal lesion. Sinuses/Orbits: No acute finding. Other:  None. IMPRESSION: 1. No acute intracranial findings. 2. Moderate chronic microvascular ischemic change and cerebral volume loss. Electronically Signed   By: Duanne Guess D.O.   On: 09/30/2022 12:01   DG Chest Portable 1 View  Result Date: 09/30/2022 CLINICAL DATA:  Missed dialysis session.  Altered mental status. EXAM: PORTABLE CHEST 1 VIEW COMPARISON:  Chest radiograph dated 09/08/2019 FINDINGS: Interval removal of central venous catheter. Normal lung volumes. No focal consolidations. No pleural effusion or pneumothorax. Similar mildly enlarged cardiomediastinal silhouette. No acute osseous abnormality. Upper abdominal vascular calcifications. IMPRESSION: 1. No acute cardiopulmonary process. 2. Similar mildly enlarged cardiomediastinal silhouette. Electronically Signed   By: Agustin Cree M.D.   On: 09/30/2022 11:54    Procedures .Critical Care  Performed by: Fayrene Helper, PA-C Authorized by: Fayrene Helper, PA-C   Critical care provider statement:    Critical care time (minutes):  30   Critical care was time spent personally by me on the following activities:  Development of treatment plan with patient or surrogate, discussions with consultants, evaluation of patient's response to treatment, examination of patient, ordering and review of laboratory studies, ordering and  review of radiographic studies, ordering and performing treatments and interventions, pulse oximetry, re-evaluation of patient's condition and review of old charts     Medications Ordered in ED Medications  sodium chloride flush (NS) 0.9 % injection 3 mL (has no administration in time range)  cefTRIAXone (ROCEPHIN) 1 g in sodium chloride 0.9 % 100 mL IVPB (1 g Intravenous New Bag/Given 09/30/22 1219)    ED Course/ Medical Decision Making/ A&P                                 Medical Decision Making Amount and/or Complexity of Data Reviewed Labs: ordered. Radiology: ordered.   BP (!) 179/69   Pulse 70   Temp 97.6 F (36.4 C) (Oral)   Resp 12   LMP  (LMP Unknown)   SpO2 100%   60:83 AM  72 year old-year-old with multiple comorbidities which include end-stage renal disease currently on Monday Wednesday Friday dialysis schedule, diabetes,  prior stroke currently on Plavix brought here via EMS from home for evaluation of confusion.  Per EMS note, patient last known normal was 9 PM last night but this morning when she woke up she was unable to recognize her husband and also endorsed having headache.  Shortly after that she was having trouble communicate with her husband.  When EMS arrived, patient became more alert and oriented.  She is scheduled to have her dialysis today.  At this time patient complaining of feeling confused.  She does not know why she is in the hospital.  She is without any significant complaints such as chest pain shortness of breath abdominal pain new focal numbness a few focal weakness.  On exam, female laying in bed does not appear to be in any acute discomfort.  Head is normocephalic atraumatic, lungs otherwise clear, neck without any nuchal rigidity, heart with normal rate rhythm, abdomen is soft nontender, she does have some decrease sensation to the right side of her face compared to the left.  She does have decreased grip strength on the left hand compared to right  however she has decreased strength in her right lower extremity compared to the left.  She is alert and oriented x 2.  Vital signs notable for elevated blood pressure of 179/69.  She is afebrile no hypoxia.  -  Labs ordered, independently viewed and interpreted by me.  Labs remarkable for UA showing UTI.  Will treat with rocephin.  BUN 47, Cr. 5.5. pt's last dialysis is 2 days ago, normal potassium and no SOB.  Does not need emergent dialysis. -The patient was maintained on a cardiac monitor.  I personally viewed and interpreted the cardiac monitored which showed an underlying rhythm of: NSR -Imaging independently viewed and interpreted by me and I agree with radiologist's interpretation.  Result remarkable for head CT showing no acute finding.  CXR unremarkable -This patient presents to the ED for concern of altered mental status, this involves an extensive number of treatment options, and is a complaint that carries with it a high risk of complications and morbidity.  The differential diagnosis includes UTI, stroke, electrolytes derangement, anemia,  -Co morbidities that complicate the patient evaluation includes DM, ESRD, HTN, CVA -Treatment includes rocephin -Reevaluation of the patient after these medicines showed that the patient improved -PCP office notes or outside notes reviewed -Discussion with specialist neurologist Dr. Derry Lory who recommend brain MRI and if negative, then to continue treating UTI.  Suspect recrudescence of her prior strokes in the setting of infection.  Appreciate consultation from [facility] Dr. Alvino Chapel who agrees to admit -Escalation to admission/observation considered: patient is agreeable with admission. Care discussed with Dr. Renaye Rakers.           Final Clinical Impression(s) / ED Diagnoses Final diagnoses:  Lower urinary tract infectious disease  Neurovascular deficit    Rx / DC Orders ED Discharge Orders     None         Fayrene Helper,  PA-C 09/30/22 1325    Terald Sleeper, MD 09/30/22 [phone]

## 2022-09-30 NOTE — ED Notes (Signed)
Patient transported to MRI 

## 2022-09-30 NOTE — ED Notes (Signed)
ED TO INPATIENT HANDOFF REPORT  ED Nurse Name and Phone #: Beatris Ship RN [phone]  S Name/Age/Gender Bailey [name] 72 y.o. female Room/Bed: 044C/044C  Code Status   Code Status: Full Code  Home/SNF/Other Home Patient oriented to: self, place, time, and situation Is this baseline? Yes   Triage Complete: Triage complete  Chief Complaint Acute metabolic encephalopathy [G93.41]  Triage Note Pt bib [facility] from home where she went to bed around 9pm normal and woke up at 845am today being unable to recognize husband and complaining of headache and 15 min later she was not speaking to her husband. Pt normally AOx4 per EMS but arrives alert to self and place. Pt states that she is supposed to have dialysis today   Allergies No Known Allergies  Level of Care/Admitting Diagnosis ED Disposition     ED Disposition  Admit   Condition  --   Comment  Hospital Area: [facility] [[id number]]  Level of Care: Telemetry Medical [104]  May place patient in observation at [facility] or [facility] if equivalent level of care is available:: No  Covid Evaluation: Asymptomatic - no recent exposure (last 10 days) testing not required  Diagnosis: Acute metabolic encephalopathy [[id number]]  Admitting Physician: Noralee Stain [[id number]]  Attending Physician: Noralee Stain [phone]          B Medical/Surgery History Past Medical History:  Diagnosis Date   Anemia    patient preference - stopped iron    ARF (acute renal failure) (HCC) 04/2016   dehydration   Chronic kidney disease    stage 5   DDD (degenerative disc disease), lumbar    Depression    Diabetes mellitus    Type II - pt preference - stopped lantus   Diabetic retinopathy (HCC)    Diverticulitis    History of kidney stones    passed- 6   Hypertension    Lacunar infarction (HCC)    Osteomyelitis (HCC)    Polyneuropathy    Vitamin B 12 deficiency 06/09/2017   Vitamin D deficiency    Wears partial dentures     upper   Past Surgical History:  Procedure Laterality Date   AMPUTATION Right 08/14/2016   Procedure: RIGHT 1ST RAY AMPUTATION MID-SHAFT;  Surgeon: Nadara Mustard, MD;  Location: [facility] OR;  Service: Orthopedics;  Laterality: Right;   BASCILIC VEIN TRANSPOSITION Left 06/08/2019   Procedure: LEFT BRACHIOCEPHALIC VEIN CREATION;  Surgeon: [name], MD;  Location: [facility] OR;  Service: Vascular;  Laterality: Left;   CATARACT EXTRACTION  2018   INCISION AND DRAINAGE ABSCESS Left 02/11/2014   Procedure: INCISION AND DRAINAGE ABSCESS Left Buttock;  Surgeon: Avel Peace, MD;  Location: [facility] ORS;  Service: General;  Laterality: Left;   INSERTION OF DIALYSIS CATHETER Left 09/07/2019   Procedure: INSERTION OF LEFT INTERNAL JUGULAR DIALYSIS CATHETER;  Surgeon: Larina Earthly, MD;  Location: [facility] OR;  Service: Vascular;  Laterality: Left;   IR REMOVAL TUN CV CATH W/O FL  09/04/2019   LAPAROSCOPIC SMALL BOWEL RESECTION N/A 05/09/2016   Procedure: LAPAROSCOPIC SMALL BOWEL RESECTION;  Surgeon: Karie Soda, MD;  Location: [facility] ORS;  Service: General;  Laterality: N/A;   LAPAROSCOPY N/A 05/09/2016   Procedure: LAPAROSCOPY DIAGNOSTIC, LYSIS OF ADHESIONS, SMALL BOWEL RESECTION X 2;  Surgeon: Karie Soda, MD;  Location: [facility] ORS;  Service: General;  Laterality: N/A;   TOE AMPUTATION     left foot great toe     A IV Location/Drains/Wounds Patient Lines/Drains/Airways  Status     Active Line/Drains/Airways     Name Placement date Placement time Site Days   Peripheral IV 09/30/22 20 G Anterior;Distal;Right;Upper Arm 09/30/22  0957  Arm  less than 1   Fistula / Graft Left Upper arm Arteriovenous fistula 06/08/19  1146  Upper arm  1210   Hemodialysis Catheter Left Internal jugular Double lumen Permanent (Tunneled) 09/07/19  1055  Internal jugular  1119   Incision - 3 Ports Abdomen 1: Left;Upper 2: Left;Medial 3: Left;Lower 05/09/16  0835  -- 2335            Intake/Output Last 24 hours No intake or  output data in the 24 hours ending 09/30/22 1658  Labs/Imaging Results for orders placed or performed during the hospital encounter of 09/30/22 (from the past 48 hour(s))  CBG monitoring, ED     Status: Abnormal   Collection Time: 09/30/22  9:56 AM  Result Value Ref Range   Glucose-Capillary 104 (H) 70 - 99 mg/dL    Comment: Glucose reference range applies only to samples taken after fasting for at least 8 hours.  Protime-INR     Status: None   Collection Time: 09/30/22 10:16 AM  Result Value Ref Range   Prothrombin Time 14.6 11.4 - 15.2 seconds   INR 1.1 0.8 - 1.2    Comment: (NOTE) INR goal varies based on device and disease states. Performed at [facility], [address], [place], [place] [address]   APTT     Status: None   Collection Time: 09/30/22 10:16 AM  Result Value Ref Range   aPTT 30 24 - 36 seconds    Comment: Performed at [facility], [address], [place], [place] [address]  CBC     Status: Abnormal   Collection Time: 09/30/22 10:16 AM  Result Value Ref Range   WBC 6.6 4.0 - 10.5 K/uL   RBC 3.58 (L) 3.87 - 5.11 MIL/uL   Hemoglobin 11.9 (L) 12.0 - 15.0 g/dL   HCT 13.0 86.5 - 78.4 %   MCV 105.9 (H) 80.0 - 100.0 fL   MCH 33.2 26.0 - 34.0 pg   MCHC 31.4 30.0 - 36.0 g/dL   RDW 69.6 29.5 - 28.4 %   Platelets 97 (L) 150 - 400 K/uL    Comment: Immature Platelet Fraction may be clinically indicated, consider ordering this additional test [id number] REPEATED TO VERIFY    nRBC 0.0 0.0 - 0.2 %    Comment: Performed at [facility], [address], [place], [place] [address]  Differential     Status: None   Collection Time: 09/30/22 10:16 AM  Result Value Ref Range   Neutrophils Relative % 50 %   Neutro Abs 3.4 1.7 - 7.7 K/uL   Lymphocytes Relative 34 %   Lymphs Abs 2.2 0.7 - 4.0 K/uL   Monocytes Relative 10 %   Monocytes Absolute 0.6 0.1 - 1.0 K/uL   Eosinophils Relative 4 %   Eosinophils Absolute 0.3 0.0 - 0.5 K/uL   Basophils Relative 1  %   Basophils Absolute 0.0 0.0 - 0.1 K/uL   Immature Granulocytes 1 %   Abs Immature Granulocytes 0.03 0.00 - 0.07 K/uL    Comment: Performed at [facility], [address] [place], [place] [address]  Comprehensive metabolic panel     Status: Abnormal   Collection Time: 09/30/22 10:16 AM  Result Value Ref Range   Sodium 137 135 - 145  mmol/L   Potassium 4.4 3.5 - 5.1 mmol/L   Chloride 94 (L) 98 - 111 mmol/L   CO2 25 22 - 32 mmol/L   Glucose, Bld 94 70 - 99 mg/dL    Comment: Glucose reference range applies only to samples taken after fasting for at least 8 hours.   BUN 47 (H) 8 - 23 mg/dL   Creatinine, Ser 1.61 (H) 0.44 - 1.00 mg/dL   Calcium 9.1 8.9 - 09.6 mg/dL   Total Protein 6.9 6.5 - 8.1 g/dL   Albumin 3.5 3.5 - 5.0 g/dL   AST 20 15 - 41 U/L   ALT 13 0 - 44 U/L   Alkaline Phosphatase 104 38 - 126 U/L   Total Bilirubin 0.5 0.3 - 1.2 mg/dL   GFR, Estimated 8 (L) >60 mL/min    Comment: (NOTE) Calculated using the CKD-EPI Creatinine Equation (2021)    Anion gap 18 (H) 5 - 15    Comment: Performed at [facility], [address], [place], [place] [address]  Ethanol     Status: None   Collection Time: 09/30/22 10:16 AM  Result Value Ref Range   Alcohol, Ethyl (B) <10 <10 mg/dL    Comment: (NOTE) Lowest detectable limit for serum alcohol is 10 mg/dL.  For medical purposes only. Performed at [facility], [address], [place], [place] [address]   Urinalysis, Routine w reflex microscopic -Urine, Clean Catch     Status: Abnormal   Collection Time: 09/30/22 10:16 AM  Result Value Ref Range   Color, Urine YELLOW YELLOW   APPearance HAZY (A) CLEAR   Specific Gravity, Urine 1.009 1.005 - 1.030   pH 9.0 (H) 5.0 - 8.0   Glucose, UA >=500 (A) NEGATIVE mg/dL   Hgb urine dipstick NEGATIVE NEGATIVE   Bilirubin Urine NEGATIVE NEGATIVE   Ketones, ur NEGATIVE NEGATIVE mg/dL   Protein, ur >=147 (A) NEGATIVE mg/dL   Nitrite NEGATIVE NEGATIVE   Leukocytes,Ua LARGE  (A) NEGATIVE   RBC / HPF 0-5 0 - 5 RBC/hpf   WBC, UA >50 0 - 5 WBC/hpf   Bacteria, UA RARE (A) NONE SEEN   Squamous Epithelial / HPF 0-5 0 - 5 /HPF    Comment: Performed at [facility], [address], [place], [place] [address]  I-stat chem 8, ED     Status: Abnormal   Collection Time: 09/30/22 10:25 AM  Result Value Ref Range   Sodium 139 135 - 145 mmol/L   Potassium 4.4 3.5 - 5.1 mmol/L   Chloride 100 98 - 111 mmol/L   BUN 47 (H) 8 - 23 mg/dL   Creatinine, Ser 2.13 (H) 0.44 - 1.00 mg/dL   Glucose, Bld 97 70 - 99 mg/dL    Comment: Glucose reference range applies only to samples taken after fasting for at least 8 hours.   Calcium, Ion 1.00 (L) 1.15 - 1.40 mmol/L   TCO2 30 22 - 32 mmol/L   Hemoglobin 12.2 12.0 - 15.0 g/dL   HCT 08.6 57.8 - 46.9 %   MR BRAIN WO CONTRAST  Result Date: 09/30/2022 CLINICAL DATA:  Neuro deficit, acute, stroke suspected. End-stage renal disease. EXAM: MRI HEAD WITHOUT CONTRAST TECHNIQUE: Multiplanar, multiecho pulse sequences of the brain and surrounding structures were obtained without intravenous contrast. COMPARISON:  CT same day.  MRI 07/11/2022. FINDINGS: Brain: Diffusion imaging does not show any acute or subacute infarctions. Few small areas of T2 shine through are present in the  white matter. Chronic small-vessel ischemic changes are seen throughout the pons. No focal cerebellar insult. Cerebral hemispheres show confluent chronic small vessel ischemic changes throughout the white matter. Old lacunar infarction right thalamus. No cortical or large vessel territory infarction. No mass, hemorrhage, hydrocephalus or extra-axial collection. Vascular: Major vessels at the base of the brain show flow. Skull and upper cervical spine: Negative Sinuses/Orbits: Clear/normal Other: None IMPRESSION: No acute finding. Extensive chronic small-vessel ischemic changes throughout the brain as outlined above. Electronically Signed   By: Paulina Fusi M.D.   On: 09/30/2022  15:01   CT HEAD WO CONTRAST  Result Date: 09/30/2022 CLINICAL DATA:  Stroke suspected EXAM: CT HEAD WITHOUT CONTRAST TECHNIQUE: Contiguous axial images were obtained from the base of the skull through the vertex without intravenous contrast. RADIATION DOSE REDUCTION: This exam was performed according to the departmental dose-optimization program which includes automated exposure control, adjustment of the mA and/or kV according to patient size and/or use of iterative reconstruction technique. COMPARISON:  07/11/2022 FINDINGS: Brain: No evidence of acute infarction, hemorrhage, hydrocephalus, extra-axial collection or mass lesion/mass effect. Patchy low-density changes within the periventricular and subcortical white matter most compatible with chronic microvascular ischemic change. Similar diffuse cerebral volume loss. Vascular: Atherosclerotic calcifications involving the large vessels of the skull base. No unexpected hyperdense vessel. Skull: Normal. Negative for fracture or focal lesion. Sinuses/Orbits: No acute finding. Other: None. IMPRESSION: 1. No acute intracranial findings. 2. Moderate chronic microvascular ischemic change and cerebral volume loss. Electronically Signed   By: Duanne Guess D.O.   On: 09/30/2022 12:01   DG Chest Portable 1 View  Result Date: 09/30/2022 CLINICAL DATA:  Missed dialysis session.  Altered mental status. EXAM: PORTABLE CHEST 1 VIEW COMPARISON:  Chest radiograph dated 09/08/2019 FINDINGS: Interval removal of central venous catheter. Normal lung volumes. No focal consolidations. No pleural effusion or pneumothorax. Similar mildly enlarged cardiomediastinal silhouette. No acute osseous abnormality. Upper abdominal vascular calcifications. IMPRESSION: 1. No acute cardiopulmonary process. 2. Similar mildly enlarged cardiomediastinal silhouette. Electronically Signed   By: Agustin Cree M.D.   On: 09/30/2022 11:54    Pending Labs Unresulted Labs (From admission, onward)      Start     Ordered   09/30/22 1155  Urine Culture  Once,   URGENT       Question:  Indication  Answer:  Altered mental status (if no other cause identified)   09/30/22 1154   Signed and Held  CBC  Tomorrow morning,   R        Signed and Held   Signed and Held  Basic metabolic panel  Tomorrow morning,   R        Signed and Held            Vitals/Pain Today's Vitals   09/30/22 1515 09/30/22 1530 09/30/22 1600 09/30/22 1615  BP: (!) 175/61 (!) 158/59 (!) 148/59   Pulse: 65 66 67 65  Resp: 18 19 20 19   Temp:      TempSrc:      SpO2: 96% 97% 99% 99%  PainSc:        Isolation Precautions No active isolations  Medications Medications  sodium chloride flush (NS) 0.9 % injection 3 mL (0 mLs Intravenous Hold 09/30/22 1627)  cefTRIAXone (ROCEPHIN) 1 g in sodium chloride 0.9 % 100 mL IVPB (0 g Intravenous Stopped 09/30/22 1330)    Mobility walks      R Recommendations: See Admitting Provider Note  Report given to: [id number]

## 2022-09-30 NOTE — ED Notes (Signed)
Happy meal and ginger ale provided

## 2022-09-30 NOTE — ED Triage Notes (Signed)
Pt bib [facility] from home where she went to bed around 9pm normal and woke up at 845am today being unable to recognize husband and complaining of headache and 15 min later she was not speaking to her husband. Pt normally AOx4 per EMS but arrives alert to self and place. Pt states that she is supposed to have dialysis today

## 2022-09-30 NOTE — H&P (Signed)
History and Physical    Bailey [name] ZOX:[id number] DOB: [birth year] DOA: 09/30/2022  PCP: Lewis Moccasin, MD  Patient coming from: Home  Chief Complaint: encephalopathy   HPI: Bailey [name] is a 72 y.o. female with medical history significant of ESRD on HD MWF, DM, HTN, who presents to the ED 09/30/22  due to confusion. Husband states that when she woke up this morning, she seemed to be confused, complained of a headache.  She then became unresponsive, was alert but staring off into the space and not responding to his questions.  This episode lasted about 25 to 30 minutes.  Once EMS arrived, patient was more interactive, but did not recognize husband, was pushing him away.  In the emergency department on my evaluation, patient has returned back to her baseline, answering questions appropriately and following all commands.  Patient denies any recent illnesses, makes little urine, but denies any dysuria or pelvic discomfort.  ED Course: CT head, MRI brain completed.  Discussed with neurology who thought this was secondary to possible UTI.  Review of Systems: As per HPI. Otherwise, all other review of systems reviewed and are negative.   Past Medical History:  Diagnosis Date   Anemia    patient preference - stopped iron    ARF (acute renal failure) (HCC) 04/2016   dehydration   Chronic kidney disease    stage 5   DDD (degenerative disc disease), lumbar    Depression    Diabetes mellitus    Type II - pt preference - stopped lantus   Diabetic retinopathy (HCC)    Diverticulitis    History of kidney stones    passed- 6   Hypertension    Lacunar infarction (HCC)    Osteomyelitis (HCC)    Polyneuropathy    Vitamin B 12 deficiency 06/09/2017   Vitamin D deficiency    Wears partial dentures    upper    Past Surgical History:  Procedure Laterality Date   AMPUTATION Right 08/14/2016   Procedure: RIGHT 1ST RAY AMPUTATION MID-SHAFT;  Surgeon: Nadara Mustard, MD;  Location: [facility] OR;   Service: Orthopedics;  Laterality: Right;   BASCILIC VEIN TRANSPOSITION Left 06/08/2019   Procedure: LEFT BRACHIOCEPHALIC VEIN CREATION;  Surgeon: [name], MD;  Location: [facility] OR;  Service: Vascular;  Laterality: Left;   CATARACT EXTRACTION  2018   INCISION AND DRAINAGE ABSCESS Left 02/11/2014   Procedure: INCISION AND DRAINAGE ABSCESS Left Buttock;  Surgeon: Avel Peace, MD;  Location: [facility] ORS;  Service: General;  Laterality: Left;   INSERTION OF DIALYSIS CATHETER Left 09/07/2019   Procedure: INSERTION OF LEFT INTERNAL JUGULAR DIALYSIS CATHETER;  Surgeon: Larina Earthly, MD;  Location: [facility] OR;  Service: Vascular;  Laterality: Left;   IR REMOVAL TUN CV CATH W/O FL  09/04/2019   LAPAROSCOPIC SMALL BOWEL RESECTION N/A 05/09/2016   Procedure: LAPAROSCOPIC SMALL BOWEL RESECTION;  Surgeon: Karie Soda, MD;  Location: [facility] ORS;  Service: General;  Laterality: N/A;   LAPAROSCOPY N/A 05/09/2016   Procedure: LAPAROSCOPY DIAGNOSTIC, LYSIS OF ADHESIONS, SMALL BOWEL RESECTION X 2;  Surgeon: Karie Soda, MD;  Location: [facility] ORS;  Service: General;  Laterality: N/A;   TOE AMPUTATION     left foot great toe     reports that she has never smoked. She has never used smokeless tobacco. She reports that she does not drink alcohol and does not use drugs.  No Known Allergies  Family History  Problem Relation Age of Onset  Diabetes Mother    Asthma Sister     Prior to Admission medications   Medication Sig Start Date End Date Taking? Authorizing Provider  amLODipine (NORVASC) 5 MG tablet Take 1 tablet (5 mg total) by mouth at bedtime. 07/14/22   Lyndle Herrlich, MD  aspirin EC 81 MG tablet Take 1 tablet (81 mg total) by mouth daily. Swallow whole. 07/14/22   Lyndle Herrlich, MD  atorvastatin (LIPITOR) 40 MG tablet TAKE 1 TABLET(40 MG) BY MOUTH DAILY 09/01/22   Ocie Doyne, MD  AURYXIA 1 GM 210 MG(Fe) tablet Take 210 mg by mouth 3 (three) times daily. 02/06/21   [provider]  clopidogrel (PLAVIX) 75 MG tablet Take 1 tablet (75 mg total) by mouth daily. 07/29/22     donepezil (ARICEPT) 5 MG tablet Take 1/2 pill daily for 4 weeks, then increase to 1 pill daily 12/22/21   Ocie Doyne, MD  multivitamin (RENA-VIT) TABS tablet Take 1 tablet by mouth daily.    [provider]  olmesartan (BENICAR) 40 MG tablet Take 1/2 tablet (20 mg total) by mouth daily. 07/29/22       Physical Exam: Vitals:   09/30/22 1315 09/30/22 1330 09/30/22 1345 09/30/22 1451  BP: (!) 163/64 (!) 162/62 (!) 162/64   Pulse: 68 66 65   Resp: 16 12 16    Temp:    98.1 F (36.7 C)  TempSrc:    Oral  SpO2: 100% 100% 100%      Constitutional: NAD, calm, comfortable Eyes: PERRL, lids and conjunctivae normal ENMT: Mucous membranes are moist. Normal dentition.  Respiratory: Clear to auscultation bilaterally, no wheezing, no crackles. Normal respiratory effort. No accessory muscle use. No conversational dyspnea  Cardiovascular: Regular rate and rhythm, + systolic murmur. No extremity edema.  Abdomen: Soft, nondistended, nontender to palpation. Bowel sounds positive.  Musculoskeletal: No joint deformity upper and lower extremities. No contractures. Normal muscle tone.  Skin: no rashes, lesions, ulcers on exposed skin  Neurologic: Alert and oriented, speech fluent, CN 2-12 grossly intact. No focal deficits.   Psychiatric: Normal judgment and insight. Normal mood and affect   Labs on Admission: I have personally reviewed following labs and imaging studies  CBC: Recent Labs  Lab 09/30/22 1016 09/30/22 1025  WBC 6.6  --   NEUTROABS 3.4  --   HGB 11.9* 12.2  HCT 37.9 36.0  MCV 105.9*  --   PLT 97*  --    Basic Metabolic Panel: Recent Labs  Lab 09/30/22 1016 09/30/22 1025  NA 137 139  K 4.4 4.4  CL 94* 100  CO2 25  --   GLUCOSE 94 97  BUN 47* 47*  CREATININE 5.50* 6.40*  CALCIUM 9.1  --    GFR: CrCl cannot be calculated (Unknown ideal weight.). Liver  Function Tests: Recent Labs  Lab 09/30/22 1016  AST 20  ALT 13  ALKPHOS 104  BILITOT 0.5  PROT 6.9  ALBUMIN 3.5   No results for input(s): "LIPASE", "AMYLASE" in the last 168 hours. No results for input(s): "AMMONIA" in the last 168 hours. Coagulation Profile: Recent Labs  Lab 09/30/22 1016  INR 1.1   Cardiac Enzymes: No results for input(s): "CKTOTAL", "CKMB", "CKMBINDEX", "TROPONINI" in the last 168 hours. BNP (last 3 results) No results for input(s): "PROBNP" in the last 8760 hours. HbA1C: No results for input(s): "HGBA1C" in the last 72 hours. CBG: Recent Labs  Lab 09/30/22 0956  GLUCAP 104*   Lipid Profile: No results for input(s): "CHOL", "  HDL", "LDLCALC", "TRIG", "CHOLHDL", "LDLDIRECT" in the last 72 hours. Thyroid Function Tests: No results for input(s): "TSH", "T4TOTAL", "FREET4", "T3FREE", "THYROIDAB" in the last 72 hours. Anemia Panel: No results for input(s): "VITAMINB12", "FOLATE", "FERRITIN", "TIBC", "IRON", "RETICCTPCT" in the last 72 hours. Urine analysis:    Component Value Date/Time   COLORURINE YELLOW 09/30/2022 1016   APPEARANCEUR HAZY (A) 09/30/2022 1016   LABSPEC 1.009 09/30/2022 1016   PHURINE 9.0 (H) 09/30/2022 1016   GLUCOSEU >=500 (A) 09/30/2022 1016   HGBUR NEGATIVE 09/30/2022 1016   BILIRUBINUR NEGATIVE 09/30/2022 1016   BILIRUBINUR small 02/10/2014 1127   KETONESUR NEGATIVE 09/30/2022 1016   PROTEINUR >=300 (A) 09/30/2022 1016   UROBILINOGEN 1.0 02/10/2014 2233   NITRITE NEGATIVE 09/30/2022 1016   LEUKOCYTESUR LARGE (A) 09/30/2022 1016   Sepsis Labs: !!!!!!!!!!!!!!!!!!!!!!!!!!!!!!!!!!!!!!!!!!!! @LABRCNTIP (procalcitonin:4,lacticidven:4) )No results found for this or any previous visit (from the past 240 hour(s)).   Radiological Exams on Admission: MR BRAIN WO CONTRAST  Result Date: 09/30/2022 CLINICAL DATA:  Neuro deficit, acute, stroke suspected. End-stage renal disease. EXAM: MRI HEAD WITHOUT CONTRAST TECHNIQUE: Multiplanar,  multiecho pulse sequences of the brain and surrounding structures were obtained without intravenous contrast. COMPARISON:  CT same day.  MRI 07/11/2022. FINDINGS: Brain: Diffusion imaging does not show any acute or subacute infarctions. Few small areas of T2 shine through are present in the white matter. Chronic small-vessel ischemic changes are seen throughout the pons. No focal cerebellar insult. Cerebral hemispheres show confluent chronic small vessel ischemic changes throughout the white matter. Old lacunar infarction right thalamus. No cortical or large vessel territory infarction. No mass, hemorrhage, hydrocephalus or extra-axial collection. Vascular: Major vessels at the base of the brain show flow. Skull and upper cervical spine: Negative Sinuses/Orbits: Clear/normal Other: None IMPRESSION: No acute finding. Extensive chronic small-vessel ischemic changes throughout the brain as outlined above. Electronically Signed   By: Paulina Fusi M.D.   On: 09/30/2022 15:01   CT HEAD WO CONTRAST  Result Date: 09/30/2022 CLINICAL DATA:  Stroke suspected EXAM: CT HEAD WITHOUT CONTRAST TECHNIQUE: Contiguous axial images were obtained from the base of the skull through the vertex without intravenous contrast. RADIATION DOSE REDUCTION: This exam was performed according to the departmental dose-optimization program which includes automated exposure control, adjustment of the mA and/or kV according to patient size and/or use of iterative reconstruction technique. COMPARISON:  07/11/2022 FINDINGS: Brain: No evidence of acute infarction, hemorrhage, hydrocephalus, extra-axial collection or mass lesion/mass effect. Patchy low-density changes within the periventricular and subcortical white matter most compatible with chronic microvascular ischemic change. Similar diffuse cerebral volume loss. Vascular: Atherosclerotic calcifications involving the large vessels of the skull base. No unexpected hyperdense vessel. Skull: Normal.  Negative for fracture or focal lesion. Sinuses/Orbits: No acute finding. Other: None. IMPRESSION: 1. No acute intracranial findings. 2. Moderate chronic microvascular ischemic change and cerebral volume loss. Electronically Signed   By: Duanne Guess D.O.   On: 09/30/2022 12:01   DG Chest Portable 1 View  Result Date: 09/30/2022 CLINICAL DATA:  Missed dialysis session.  Altered mental status. EXAM: PORTABLE CHEST 1 VIEW COMPARISON:  Chest radiograph dated 09/08/2019 FINDINGS: Interval removal of central venous catheter. Normal lung volumes. No focal consolidations. No pleural effusion or pneumothorax. Similar mildly enlarged cardiomediastinal silhouette. No acute osseous abnormality. Upper abdominal vascular calcifications. IMPRESSION: 1. No acute cardiopulmonary process. 2. Similar mildly enlarged cardiomediastinal silhouette. Electronically Signed   By: Agustin Cree M.D.   On: 09/30/2022 11:54    EKG: Independently reviewed.  Normal sinus  rhythm, rate 73  Assessment/Plan Principal Problem:   Acute metabolic encephalopathy Active Problems:   ESRD on dialysis (HCC)   Essential hypertension   HLD (hyperlipidemia)   History of CVA (cerebrovascular accident)   Acute metabolic encephalopathy -CT head negative for acute intracranial findings, moderate chronic microvascular ischemic changes and cerebral volume loss -MRI brain no acute finding, extensive chronic small vessel ischemic changes -Question UTI causing recrudescence of her prior stroke in setting of infection. EDP discussed with Neurology  -Check EEG  Pyuria -UA with large leukocytes, negative nitrite, >50 WBC -Urine culture pending -Empiric Rocephin  ESRD -HD MWF -Nephrology consulted today  Hypertension -Norvasc, Benicar  Hyperlipidemia -Lipitor  Hx CVA  -Aspirin, Plavix   DVT prophylaxis: subcutaneous hep   Code Status: Full, discussed with patient, spouse as well as son who is POA over the phone Family  Communication: Husband at bedside, son over the phone Disposition Plan: Home Consults called: Nephrology   Status is: Observation The patient remains OBS appropriate and will d/c before 2 midnights.   Severity of Illness: The appropriate patient status for this patient is OBSERVATION. Observation status is judged to be reasonable and necessary in order to provide the required intensity of service to ensure the patient's safety. The patient's presenting symptoms, physical exam findings, and initial radiographic and laboratory data in the context of their medical condition is felt to place them at decreased risk for further clinical deterioration. Furthermore, it is anticipated that the patient will be medically stable for discharge from the hospital within 2 midnights of admission.   Noralee Stain, DO [facility] 09/30/2022, 4:00 PM   Available via Epic secure chat 7am-7pm After these hours, please refer to coverage provider listed on [email or web address]

## 2022-09-30 NOTE — Progress Notes (Signed)
EEG complete - results pending 

## 2022-09-30 NOTE — Consult Note (Signed)
Renal Service Consult Note [facility]  Bailey [name] 09/30/2022 Bailey Krabbe, MD Requesting Physician: Dr. Alvino Chapel  Reason for Consult: ESRD pt w/ AMS HPI: The patient is a 72 y.o. year-old w/ PMH as below who presented to ED with headache and not able to recognize her husband. Usually is O x 4.  In ED pt had returned to baseline. CT head and MRI was done. Neurology consulted. We are asked to see for dialysis.   Pt seen in room. Last OP HD was Monday 2 days ago. Missed HD today.  No c/o's at this time.    ROS - denies CP, no joint pain, no HA, no blurry vision, no rash, no diarrhea, no nausea/ vomiting, no dysuria, no difficulty voiding   Past Medical History  Past Medical History:  Diagnosis Date   Anemia    patient preference - stopped iron    ARF (acute renal failure) (HCC) 04/2016   dehydration   Chronic kidney disease    stage 5   DDD (degenerative disc disease), lumbar    Depression    Diabetes mellitus    Type II - pt preference - stopped lantus   Diabetic retinopathy (HCC)    Diverticulitis    History of kidney stones    passed- 6   Hypertension    Lacunar infarction (HCC)    Osteomyelitis (HCC)    Polyneuropathy    Vitamin B 12 deficiency 06/09/2017   Vitamin D deficiency    Wears partial dentures    upper   Past Surgical History  Past Surgical History:  Procedure Laterality Date   AMPUTATION Right 08/14/2016   Procedure: RIGHT 1ST RAY AMPUTATION MID-SHAFT;  Surgeon: Nadara Mustard, MD;  Location: [facility] OR;  Service: Orthopedics;  Laterality: Right;   BASCILIC VEIN TRANSPOSITION Left 06/08/2019   Procedure: LEFT BRACHIOCEPHALIC VEIN CREATION;  Surgeon: [name], MD;  Location: [facility] OR;  Service: Vascular;  Laterality: Left;   CATARACT EXTRACTION  2018   INCISION AND DRAINAGE ABSCESS Left 02/11/2014   Procedure: INCISION AND DRAINAGE ABSCESS Left Buttock;  Surgeon: Avel Peace, MD;  Location: [facility] ORS;  Service: General;   Laterality: Left;   INSERTION OF DIALYSIS CATHETER Left 09/07/2019   Procedure: INSERTION OF LEFT INTERNAL JUGULAR DIALYSIS CATHETER;  Surgeon: Larina Earthly, MD;  Location: [facility] OR;  Service: Vascular;  Laterality: Left;   IR REMOVAL TUN CV CATH W/O FL  09/04/2019   LAPAROSCOPIC SMALL BOWEL RESECTION N/A 05/09/2016   Procedure: LAPAROSCOPIC SMALL BOWEL RESECTION;  Surgeon: Karie Soda, MD;  Location: [facility];  Service: General;  Laterality: N/A;   LAPAROSCOPY N/A 05/09/2016   Procedure: LAPAROSCOPY DIAGNOSTIC, LYSIS OF ADHESIONS, SMALL BOWEL RESECTION X 2;  Surgeon: Karie Soda, MD;  Location: [facility];  Service: General;  Laterality: N/A;   TOE AMPUTATION     left foot great toe   Family History  Family History  Problem Relation Age of Onset   Diabetes Mother    Asthma Sister    Social History  reports that she has never smoked. She has never used smokeless tobacco. She reports that she does not drink alcohol and does not use drugs. Allergies No Known Allergies Home medications Prior to Admission medications   Medication Sig Start Date End Date Taking? Authorizing Provider  amLODipine (NORVASC) 5 MG tablet Take 1 tablet (5 mg total) by mouth at bedtime. 07/14/22   Lyndle Herrlich, MD  aspirin EC 81 MG tablet Take 1  tablet (81 mg total) by mouth daily. Swallow whole. Patient not taking: Reported on 09/30/2022 07/14/22   Lyndle Herrlich, MD  atorvastatin (LIPITOR) 40 MG tablet TAKE 1 TABLET(40 MG) BY MOUTH DAILY 09/01/22   Ocie Doyne, MD  AURYXIA 1 GM 210 MG(Fe) tablet Take 210 mg by mouth 3 (three) times daily. 02/06/21   [provider]  clopidogrel (PLAVIX) 75 MG tablet Take 1 tablet (75 mg total) by mouth daily. 07/29/22     donepezil (ARICEPT) 5 MG tablet Take 1/2 pill daily for 4 weeks, then increase to 1 pill daily 12/22/21   Ocie Doyne, MD  multivitamin (RENA-VIT) TABS tablet Take 1 tablet by mouth daily.    [provider]  olmesartan (BENICAR) 40  MG tablet Take 1/2 tablet (20 mg total) by mouth daily. 07/29/22        Vitals:   09/30/22 1530 09/30/22 1600 09/30/22 1615 09/30/22 1823  BP: (!) 158/59 (!) 148/59  (!) 180/60  Pulse: 66 67 65 69  Resp: 19 20 19    Temp:    97.9 F (36.6 C)  TempSrc:    Oral  SpO2: 97% 99% 99% 100%   Exam Gen alert, no distress No rash, cyanosis or gangrene Sclera anicteric, throat clear  No jvd or bruits Chest clear bilat to bases, no rales/ wheezing RRR no MRG Abd soft ntnd no mass or ascites +bs GU defer MS no joint effusions or deformity Ext no LE or UE edema, no wounds or ulcers Neuro is alert, Ox 3 , nf    LUA AVF+bruit      Home meds include - norvasc 5, aspirin, aotrvastatin, auryxia 210 ac tid, clopidogrel, donepezil, renavite, olmesartan 150 qd     OP HD: MWF SW 3.5h  350/500  47.6kg  2/2 bath  AVF   Heparin none - last OP HD 8/05, post wt 46.1kg - hectorol 4 mcg IV three times per week - mircera 50 mcg IV q 2 wks, last 7/29, due 8/12   Assessment/ Plan: Altered mental status - MRI w/o acute findings, chronic WM changes. Prior CVA may have been re-triggered by infection (UTI) per pmd.  ESRD - on HD MWF. Last HD Monday. No acute indication for HD today, will plan HD tomorrow off schedule.  HTN/ volume - bp's high-normal, cont home norvasc and ARB. No gross vol overload. Get standing wt pre Hd tomorrow.  Anemia esrd - Hb >10, no esa needs. See above.  MBD ckd - CCa in range, cont binder and IV vdra.  Pyuria - possible UTI, getting rocephin H/o CVA - on asa, plavix      Vinson Moselle  MD [facility] 09/30/2022, 7:06 PM  Recent Labs  Lab 09/30/22 1016 09/30/22 1025  HGB 11.9* 12.2  ALBUMIN 3.5  --   CALCIUM 9.1  --   CREATININE 5.50* 6.40*  K 4.4 4.4   Inpatient medications:  amLODipine  5 mg Oral QHS   [START ON 10/01/2022] atorvastatin  40 mg Oral Daily   [START ON 10/01/2022] clopidogrel  75 mg Oral Daily   heparin  5,000 Units Subcutaneous Q8H   [START ON 10/01/2022]  irbesartan  150 mg Oral Daily   sodium chloride flush  3 mL Intravenous Once   sodium chloride flush  3 mL Intravenous Q12H   sodium chloride flush  3 mL Intravenous Q12H    sodium chloride     [START ON 10/01/2022] cefTRIAXone (ROCEPHIN)  IV     sodium chloride, acetaminophen **  OR** acetaminophen, ondansetron **OR** ondansetron (ZOFRAN) IV, polyethylene glycol, sodium chloride flush

## 2022-09-30 NOTE — Plan of Care (Signed)
  Problem: Education: Goal: Knowledge of General Education information will improve Description Including pain rating scale, medication(s)/side effects and non-pharmacologic comfort measures Outcome: Progressing   Problem: Health Behavior/Discharge Planning: Goal: Ability to manage health-related needs will improve Outcome: Progressing   

## 2022-10-01 DIAGNOSIS — E785 Hyperlipidemia, unspecified: Secondary | ICD-10-CM | POA: Diagnosis present

## 2022-10-01 DIAGNOSIS — F32A Depression, unspecified: Secondary | ICD-10-CM | POA: Diagnosis present

## 2022-10-01 DIAGNOSIS — N2581 Secondary hyperparathyroidism of renal origin: Secondary | ICD-10-CM | POA: Diagnosis not present

## 2022-10-01 DIAGNOSIS — Z833 Family history of diabetes mellitus: Secondary | ICD-10-CM | POA: Diagnosis not present

## 2022-10-01 DIAGNOSIS — N250 Renal osteodystrophy: Secondary | ICD-10-CM | POA: Diagnosis not present

## 2022-10-01 DIAGNOSIS — Z79899 Other long term (current) drug therapy: Secondary | ICD-10-CM | POA: Diagnosis not present

## 2022-10-01 DIAGNOSIS — R569 Unspecified convulsions: Secondary | ICD-10-CM | POA: Diagnosis not present

## 2022-10-01 DIAGNOSIS — Z992 Dependence on renal dialysis: Secondary | ICD-10-CM | POA: Diagnosis not present

## 2022-10-01 DIAGNOSIS — G9341 Metabolic encephalopathy: Secondary | ICD-10-CM | POA: Diagnosis present

## 2022-10-01 DIAGNOSIS — N390 Urinary tract infection, site not specified: Secondary | ICD-10-CM | POA: Diagnosis present

## 2022-10-01 DIAGNOSIS — N186 End stage renal disease: Secondary | ICD-10-CM | POA: Diagnosis present

## 2022-10-01 DIAGNOSIS — I120 Hypertensive chronic kidney disease with stage 5 chronic kidney disease or end stage renal disease: Secondary | ICD-10-CM | POA: Diagnosis present

## 2022-10-01 DIAGNOSIS — R8281 Pyuria: Secondary | ICD-10-CM | POA: Diagnosis present

## 2022-10-01 DIAGNOSIS — Z7902 Long term (current) use of antithrombotics/antiplatelets: Secondary | ICD-10-CM | POA: Diagnosis not present

## 2022-10-01 DIAGNOSIS — I69398 Other sequelae of cerebral infarction: Secondary | ICD-10-CM | POA: Diagnosis not present

## 2022-10-01 DIAGNOSIS — R4182 Altered mental status, unspecified: Secondary | ICD-10-CM | POA: Diagnosis not present

## 2022-10-01 DIAGNOSIS — Z87442 Personal history of urinary calculi: Secondary | ICD-10-CM | POA: Diagnosis not present

## 2022-10-01 DIAGNOSIS — Z825 Family history of asthma and other chronic lower respiratory diseases: Secondary | ICD-10-CM | POA: Diagnosis not present

## 2022-10-01 DIAGNOSIS — E11319 Type 2 diabetes mellitus with unspecified diabetic retinopathy without macular edema: Secondary | ICD-10-CM | POA: Diagnosis present

## 2022-10-01 DIAGNOSIS — N189 Chronic kidney disease, unspecified: Secondary | ICD-10-CM | POA: Diagnosis not present

## 2022-10-01 DIAGNOSIS — E1122 Type 2 diabetes mellitus with diabetic chronic kidney disease: Secondary | ICD-10-CM | POA: Diagnosis present

## 2022-10-01 DIAGNOSIS — Z7982 Long term (current) use of aspirin: Secondary | ICD-10-CM | POA: Diagnosis not present

## 2022-10-01 DIAGNOSIS — Z89431 Acquired absence of right foot: Secondary | ICD-10-CM | POA: Diagnosis not present

## 2022-10-01 DIAGNOSIS — M898X9 Other specified disorders of bone, unspecified site: Secondary | ICD-10-CM | POA: Diagnosis present

## 2022-10-01 DIAGNOSIS — D631 Anemia in chronic kidney disease: Secondary | ICD-10-CM | POA: Diagnosis present

## 2022-10-01 MED ORDER — ASPIRIN 81 MG PO TBEC
81.0000 mg | DELAYED_RELEASE_TABLET | Freq: Every day | ORAL | Status: DC
  Administered 2022-10-01 – 2022-10-02 (×2): 81 mg via ORAL
  Filled 2022-10-01 (×2): qty 1

## 2022-10-01 MED ORDER — DONEPEZIL HCL 5 MG PO TABS
5.0000 mg | ORAL_TABLET | Freq: Every day | ORAL | Status: DC
  Administered 2022-10-01: 5 mg via ORAL
  Filled 2022-10-01: qty 1

## 2022-10-01 NOTE — Evaluation (Signed)
Occupational Therapy Evaluation Patient Details Name: Bailey [name] MRN: [id number] DOB: 07-14-1950 Today's Date: 10/01/2022   History of Present Illness Pt is 72 year old presented to [facility] on  09/30/22 for confusion. MRI negative for acute changes. Pt with acute metabolic encephalopathy and possible UTI. PMH - CVA, ESRD on HD, DM, HTN, toe amputations, polyneuropathy   Clinical Impression   Pt s/p above diagnosis. Pt A/Ox2, not oriented to date/situation, knows she is at [facility] but not sure why. Pt reminded of date/situation throughout session, not able to recall at end of session. Pt has difficulty with sequencing, task initiation, decreased safety awareness, and not aware of deficits. Pt has good overall strength/endurance to complete mobility/ADLs. Pt has poor balance, requires RW for support and CGA due to LOB. Pt would benefit from continued acute OT to maximize safety awareness and functional independence as able, recommended HHOT to follow up for safety around home with new confusion/memory loss.    If plan is discharge home, recommend the following: A little help with walking and/or transfers;A little help with bathing/dressing/bathroom;Direct supervision/assist for medications management;Assist for transportation;Help with stairs or ramp for entrance;Supervision due to cognitive status    Functional Status Assessment  Patient has had a recent decline in their functional status and demonstrates the ability to make significant improvements in function in a reasonable and predictable amount of time.  Equipment Recommendations  Other (comment) (defer)    Recommendations for Other Services       Precautions / Restrictions Precautions Precautions: Fall Restrictions Weight Bearing Restrictions: No      Mobility Bed Mobility Overal bed mobility: Needs Assistance             General bed mobility comments: supervision, verbal cueing for safety and task initiation, sequencing due to  confusion    Transfers Overall transfer level: Needs assistance Equipment used: Rolling walker (2 wheels) Transfers: Sit to/from Stand, Bed to chair/wheelchair/BSC Sit to Stand: Contact guard assist     Step pivot transfers: Contact guard assist     General transfer comment: decreased dynamic stability with LOB, requires RW and contact guard for support, good strength/endurance.      Balance Overall balance assessment: Needs assistance Sitting-balance support: Feet supported Sitting balance-Leahy Scale: Good Sitting balance - Comments: EOB ADLs, leaning L/R, good bed mobilty, supervision for safety   Standing balance support: During functional activity, Reliant on assistive device for balance Standing balance-Leahy Scale: Poor Standing balance comment: not able to tolerate challenge, decreased dynamic stability with LOB                           ADL either performed or assessed with clinical judgement   ADL Overall ADL's : Needs assistance/impaired                                       General ADL Comments: Pt set up/supervision for ADLs, CGA for mobility. decreased awareness and sequencing due to confusion. Pt displays overall good strength/ROM to complete ADLs,     Vision Baseline Vision/History: 1 Wears glasses Ability to See in Adequate Light: 0 Adequate Patient Visual Report: No change from baseline       Perception         Praxis         Pertinent Vitals/Pain Pain Assessment Pain Assessment: No/denies pain     Extremity/Trunk  Assessment Upper Extremity Assessment Upper Extremity Assessment: Overall WFL for tasks assessed   Lower Extremity Assessment Lower Extremity Assessment: Defer to PT evaluation       Communication Communication Communication: No apparent difficulties   Cognition   Behavior During Therapy: WFL for tasks assessed/performed Overall Cognitive Status: Impaired/Different from baseline Area of  Impairment: Orientation, Memory, Safety/judgement, Awareness, Problem solving                 Orientation Level: Disoriented to, Time, Situation   Memory: Decreased recall of precautions, Decreased short-term memory   Safety/Judgement: Decreased awareness of safety, Decreased awareness of deficits   Problem Solving: Slow processing, Difficulty sequencing, Requires verbal cues General Comments: able to follow verbal cues as needed, not oriented to time/situation, knows she is at [facility], not sure why. Pt not able to recall date/situation at end of session after repeated reminders throughout. Pt anxious, confused, decreased sequencing, decreased awareness of deficits.     General Comments       Exercises     Shoulder Instructions      Home Living Family/patient expects to be discharged to:: Private residence Living Arrangements: Spouse/significant other Available Help at Discharge: Family;Available PRN/intermittently Type of Home: House Home Access: Stairs to enter [employer] of Steps: 6 Entrance Stairs-Rails: Right;Left Home Layout: One level     Bathroom Shower/Tub: Walk-in shower;Tub/shower unit   Bathroom Toilet: Standard     Home Equipment: Agricultural consultant (2 wheels);Cane - single point;Shower seat   Additional Comments: Pt reports living with husband, has two sonce who live in nearby towns, can assist sometimes if needed.      Prior Functioning/Environment Prior Level of Function : Needs assist             Mobility Comments: Pt states independence, prior hx reports imbalance due to B large toe amputations, HHA for community ambulation. ADLs Comments: Pt states independent, but from recent visit in May Pt needs assist for IADL's due to memory loss, wears Depends        OT Problem List: Decreased activity tolerance;Impaired balance (sitting and/or standing);Decreased cognition;Decreased safety awareness;Decreased knowledge of use of DME or AE       OT Treatment/Interventions: Self-care/ADL training;Therapeutic exercise;DME and/or AE instruction;Cognitive remediation/compensation;Therapeutic activities;Patient/family education    OT Goals(Current goals can be found in the care plan section) Acute Rehab OT Goals Patient Stated Goal: unable to participate in goal setting OT Goal Formulation: With patient Time For Goal Achievement: 10/15/22 Potential to Achieve Goals: Good  OT Frequency: Min 1X/week    Co-evaluation              AM-PAC OT "6 Clicks" Daily Activity     Outcome Measure Help from another person eating meals?: None Help from another person taking care of personal grooming?: A Little Help from another person toileting, which includes using toliet, bedpan, or urinal?: A Little Help from another person bathing (including washing, rinsing, drying)?: A Little Help from another person to put on and taking off regular upper body clothing?: A Little Help from another person to put on and taking off regular lower body clothing?: A Little 6 Click Score: 19   End of Session Equipment Utilized During Treatment: Gait belt;Rolling walker (2 wheels) Nurse Communication: Mobility status  Activity Tolerance: Patient tolerated treatment well Patient left: in bed;with call bell/phone within reach;with bed alarm set;with nursing/sitter in room  OT Visit Diagnosis: Unsteadiness on feet (R26.81);Other abnormalities of gait and mobility (R26.89);Muscle weakness (generalized) (M62.81);Other symptoms  and signs involving cognitive function                Time: 2130-8657 OT Time Calculation (min): 24 min Charges:  OT General Charges $OT Visit: 1 Visit OT Evaluation $OT Eval Low Complexity: 1 Low OT Treatments $Self Care/Home Management : 8-22 mins  [address] OTR/L   Alexis Goodell 10/01/2022, 2:05 PM

## 2022-10-01 NOTE — Evaluation (Signed)
Physical Therapy Evaluation Patient Details Name: Bailey [name] MRN: [id number] DOB: 1950-10-07 Today's Date: 10/01/2022  History of Present Illness  Pt is 72 year old presented to [facility] on  09/30/22 for confusion. MRI negative for acute changes. Pt with acute metabolic encephalopathy and possible UTI. PMH - CVA, ESRD on HD, DM, HTN, toe amputations, polyneuropathy  Clinical Impression  Pt mobilizing in room and hallway with some unsteadiness requiring supervision for safety. Pt with chronic balance issues due to loss of bilateral great toes. Pt also with cognitive deficits - has some at baseline so might be at baseline.  Pt reports family available at home. Recommend home with family and HHPT.        If plan is discharge home, recommend the following: Direct supervision/assist for financial management;Direct supervision/assist for medications management;Help with stairs or ramp for entrance;Supervision due to cognitive status;A little help with walking and/or transfers;A little help with bathing/dressing/bathroom   Can travel by private vehicle        Equipment Recommendations None recommended by PT  Recommendations for Other Services       Functional Status Assessment Patient has had a recent decline in their functional status and demonstrates the ability to make significant improvements in function in a reasonable and predictable amount of time.     Precautions / Restrictions Precautions Precautions: Fall Restrictions Weight Bearing Restrictions: No      Mobility  Bed Mobility Overal bed mobility: Modified Independent Bed Mobility: Supine to Sit, Sit to Supine     Supine to sit: Modified independent (Device/Increase time) Sit to supine: Modified independent (Device/Increase time)        Transfers Overall transfer level: Needs assistance Equipment used: Rolling walker (2 wheels) Transfers: Sit to/from Stand Sit to Stand: Supervision           General transfer  comment: Assist for safety    Ambulation/Gait Ambulation/Gait assistance: Contact guard assist, Supervision Gait Distance (Feet): 200 Feet Assistive device: None, 1 person hand held assist, Rollator (4 wheels) Gait Pattern/deviations: Step-through pattern, Decreased stride length Gait velocity: decr Gait velocity interpretation: 1.31 - 2.62 ft/sec, indicative of limited community ambulator   General Gait Details: Assist for safety. In room pt supervision and using furniture to steady. In hallway CGA for safety. Slightly unsteady but no LOB. Using rollator pt with supervision for safety  Stairs            Wheelchair Mobility     Tilt Bed    Modified Rankin (Stroke Patients Only)       Balance Overall balance assessment: Needs assistance Sitting-balance support: Feet supported Sitting balance-Leahy Scale: Good     Standing balance support: During functional activity Standing balance-Leahy Scale: Fair                               Pertinent Vitals/Pain      Home Living Family/patient expects to be discharged to:: Private residence Living Arrangements: Spouse/significant other Available Help at Discharge: Family;Available 24 hours/day Type of Home: House Home Access: Stairs to enter Entrance Stairs-Rails: Doctor, general practice of Steps: 6   Home Layout: One level Home Equipment: Agricultural consultant (2 wheels);Cane - single point;Shower seat;Rollator (4 wheels) Additional Comments: Pt reports someone is home with her all of the time    Prior Function Prior Level of Function : Needs assist             Mobility Comments: Pt states  independence, prior hx reports imbalance due to B large toe amputations, HHA for community ambulation. ADLs Comments: Pt states independent, but from recent visit in May Pt needs assist for IADL's due to memory loss, wears Depends     Extremity/Trunk Assessment   Upper Extremity Assessment Upper Extremity  Assessment: Defer to OT evaluation    Lower Extremity Assessment Lower Extremity Assessment: RLE deficits/detail;LLE deficits/detail RLE Deficits / Details: great toe amputation LLE Deficits / Details: great toe amputation       Communication   Communication Communication: No apparent difficulties  Cognition   Behavior During Therapy: WFL for tasks assessed/performed Overall Cognitive Status: Impaired/Different from baseline Area of Impairment: Orientation, Memory, Safety/judgement, Awareness, Problem solving                 Orientation Level: Disoriented to, Time, Situation   Memory: Decreased recall of precautions, Decreased short-term memory   Safety/Judgement: Decreased awareness of safety, Decreased awareness of deficits   Problem Solving: Slow processing, Difficulty sequencing, Requires verbal cues          General Comments      Exercises     Assessment/Plan    PT Assessment Patient needs continued PT services  PT Problem List Decreased strength;Decreased balance;Decreased mobility       PT Treatment Interventions DME instruction;Gait training;Functional mobility training;Therapeutic activities;Therapeutic exercise;Balance training;Patient/family education;Stair training    PT Goals (Current goals can be found in the Care Plan section)  Acute Rehab PT Goals Patient Stated Goal: go home PT Goal Formulation: With patient Time For Goal Achievement: 10/15/22 Potential to Achieve Goals: Good    Frequency Min 1X/week     Co-evaluation               AM-PAC PT "6 Clicks" Mobility  Outcome Measure Help needed turning from your back to your side while in a flat bed without using bedrails?: None Help needed moving from lying on your back to sitting on the side of a flat bed without using bedrails?: None Help needed moving to and from a bed to a chair (including a wheelchair)?: A Little Help needed standing up from a chair using your arms (e.g.,  wheelchair or bedside chair)?: A Little Help needed to walk in hospital room?: A Little Help needed climbing 3-5 steps with a railing? : A Little 6 Click Score: 20    End of Session Equipment Utilized During Treatment: Gait belt Activity Tolerance: Patient tolerated treatment well Patient left: in bed;with call bell/phone within reach;with bed alarm set Nurse Communication: Mobility status PT Visit Diagnosis: Unsteadiness on feet (R26.81);Other abnormalities of gait and mobility (R26.89)    Time: 1445-1500 PT Time Calculation (min) (ACUTE ONLY): 15 min   Charges:   PT Evaluation $PT Eval Low Complexity: 1 Low   PT General Charges $$ ACUTE PT VISIT: 1 Visit         [facility] PT Acute Rehabilitation Services Office [phone]   [name] [facility] 10/01/2022, 3:46 PM

## 2022-10-01 NOTE — Progress Notes (Signed)
PROGRESS NOTE    [name]  FUX:[id number] DOB: 04/14/1950 DOA: 09/30/2022 PCP: Lewis Moccasin, MD     Brief Narrative:  Bailey [name] is a 72 y.o. female with medical history significant of ESRD on HD MWF, DM, HTN, who presents to the ED 09/30/22  due to confusion. Husband states that when she woke up this morning, she seemed to be confused, complained of a headache.  She then became unresponsive, was alert but staring off into the space and not responding to his questions.  This episode lasted about 25 to 30 minutes.  Once EMS arrived, patient was more interactive, but did not recognize husband, was pushing him away.  In the emergency department on my evaluation, patient has returned back to her baseline, answering questions appropriately and following all commands.  Patient denies any recent illnesses, makes little urine, but denies any dysuria or pelvic discomfort.   New events last 24 hours / Subjective: Patient seen in HD. No further events since prior to admission. Feeling back to baseline.   Assessment & Plan:   Principal Problem:   Acute metabolic encephalopathy Active Problems:   ESRD on dialysis (HCC)   Essential hypertension   HLD (hyperlipidemia)   History of CVA (cerebrovascular accident)   UTI (urinary tract infection)   Acute metabolic encephalopathy -CT head negative for acute intracranial findings, moderate chronic microvascular ischemic changes and cerebral volume loss -MRI brain no acute finding, extensive chronic small vessel ischemic changes -Question UTI causing recrudescence of her prior stroke in setting of infection. EDP discussed with Neurology  -EEG negative    Pyuria -UA with large leukocytes, negative nitrite, >50 WBC -Urine culture pending  -Empiric Rocephin   ESRD -HD MWF   Hypertension -Norvasc, Benicar   Hyperlipidemia -Lipitor   Hx CVA  -Aspirin, Plavix --> Aspirin.  Reviewed discharge summary from May 2024.  At that time, they  had planned for DAPT for 3 weeks then transition to aspirin monotherapy.  DVT prophylaxis:  heparin injection 5,000 Units Start: 09/30/22 2200  Code Status: Full code Family Communication: None Disposition Plan: Home Status is: Inpatient Remains inpatient appropriate because: IV antibiotics    Antimicrobials:  Anti-infectives (From admission, onward)    Start     Dose/Rate Route Frequency Ordered Stop   10/01/22 1000  cefTRIAXone (ROCEPHIN) 1 g in sodium chloride 0.9 % 100 mL IVPB        1 g 200 mL/hr over 30 Minutes Intravenous Every 24 hours 09/30/22 1823 10/03/22 0959   09/30/22 1200  cefTRIAXone (ROCEPHIN) 1 g in sodium chloride 0.9 % 100 mL IVPB        1 g 200 mL/hr over 30 Minutes Intravenous  Once 09/30/22 1154 09/30/22 1330        Objective: Vitals:   10/01/22 1130 10/01/22 1145 10/01/22 1200 10/01/22 1228  BP: (!) 149/56 (!) 146/64 (!) 149/60 (!) 145/67  Pulse: 64 66 66 66  Resp: (!) 25 12 13 20   Temp:    97.7 F (36.5 C)  TempSrc:    Oral  SpO2: 100% 100% 100% 100%  Weight:        Intake/Output Summary (Last 24 hours) at 10/01/2022 1342 Last data filed at 10/01/2022 1228 Gross per 24 hour  Intake 60 ml  Output 1304.05 ml  Net -1244.05 ml   Filed Weights   09/30/22 1914 10/01/22 0500 10/01/22 0832  Weight: 50.9 kg 50.9 kg 47.1 kg    Examination:  General exam: Appears  calm and comfortable  Respiratory system: Clear to auscultation. Respiratory effort normal. No respiratory distress. No conversational dyspnea.  Cardiovascular system: S1 & S2 heard, RRR. No murmurs. No pedal edema. Gastrointestinal system: Abdomen is nondistended, soft and nontender. Normal bowel sounds heard. Central nervous system: Alert and oriented. No focal neurological deficits. Speech clear.  Extremities: Symmetric in appearance  Skin: No rashes, lesions or ulcers on exposed skin  Psychiatry: Judgement and insight appear normal. Mood & affect appropriate.   Data Reviewed: I  have personally reviewed following labs and imaging studies  CBC: Recent Labs  Lab 09/30/22 1016 09/30/22 1025 10/03/2022 0035  WBC 6.6  --  6.4  NEUTROABS 3.4  --   --   HGB 11.9* 12.2 10.1*  HCT 37.9 36.0 31.4*  MCV 105.9*  --  100.6*  PLT 97*  --  96*   Basic Metabolic Panel: Recent Labs  Lab 09/30/22 1016 09/30/22 1025 03-Oct-2022 0035  NA 137 139 138  K 4.4 4.4 4.0  CL 94* 100 94*  CO2 25  --  29  GLUCOSE 94 97 192*  BUN 47* 47* 61*  CREATININE 5.50* 6.40* 7.05*  CALCIUM 9.1  --  8.5*   GFR: Estimated Creatinine Clearance: 5.2 mL/min (A) (by C-G formula based on SCr of 7.05 mg/dL (H)). Liver Function Tests: Recent Labs  Lab 09/30/22 1016  AST 20  ALT 13  ALKPHOS 104  BILITOT 0.5  PROT 6.9  ALBUMIN 3.5   No results for input(s): "LIPASE", "AMYLASE" in the last 168 hours. No results for input(s): "AMMONIA" in the last 168 hours. Coagulation Profile: Recent Labs  Lab 09/30/22 1016  INR 1.1   Cardiac Enzymes: No results for input(s): "CKTOTAL", "CKMB", "CKMBINDEX", "TROPONINI" in the last 168 hours. BNP (last 3 results) No results for input(s): "PROBNP" in the last 8760 hours. HbA1C: No results for input(s): "HGBA1C" in the last 72 hours. CBG: Recent Labs  Lab 09/30/22 0956  GLUCAP 104*   Lipid Profile: No results for input(s): "CHOL", "HDL", "LDLCALC", "TRIG", "CHOLHDL", "LDLDIRECT" in the last 72 hours. Thyroid Function Tests: No results for input(s): "TSH", "T4TOTAL", "FREET4", "T3FREE", "THYROIDAB" in the last 72 hours. Anemia Panel: No results for input(s): "VITAMINB12", "FOLATE", "FERRITIN", "TIBC", "IRON", "RETICCTPCT" in the last 72 hours. Sepsis Labs: No results for input(s): "PROCALCITON", "LATICACIDVEN" in the last 168 hours.  No results found for this or any previous visit (from the past 240 hour(s)).    Radiology Studies: EEG adult  Result Date: Oct 03, 2022 Charlsie Quest, MD     10-03-2022  9:12 AM Patient Name: Bailey [name]  MRN: [id number] Epilepsy Attending: Charlsie Quest Referring Physician/Provider: Noralee Stain, DO Date: 09/30/2022 Duration: 37.25 mins Patient history: 72yo F with ams getting eeg to evaluate for seizure. Level of alertness: Awake AEDs during EEG study: None Technical aspects: This EEG study was done with scalp electrodes positioned according to the 10-20 International system of electrode placement. Electrical activity was reviewed with band pass filter of 1-70Hz , sensitivity of 7 uV/mm, display speed of 63mm/sec with a 60Hz  notched filter applied as appropriate. EEG data were recorded continuously and digitally stored.  Video monitoring was available and reviewed as appropriate. Description: The posterior dominant rhythm consists of 7.5 Hz activity of moderate voltage (25-35 uV) seen predominantly in posterior head regions, symmetric and reactive to eye opening and eye closing. Hyperventilation and photic stimulation were not performed.   IMPRESSION: This study is within normal limits. No seizures or epileptiform discharges  were seen throughout the recording. A normal interictal EEG does not exclude the diagnosis of epilepsy. Charlsie Quest   MR BRAIN WO CONTRAST  Result Date: 09/30/2022 CLINICAL DATA:  Neuro deficit, acute, stroke suspected. End-stage renal disease. EXAM: MRI HEAD WITHOUT CONTRAST TECHNIQUE: Multiplanar, multiecho pulse sequences of the brain and surrounding structures were obtained without intravenous contrast. COMPARISON:  CT same day.  MRI 07/11/2022. FINDINGS: Brain: Diffusion imaging does not show any acute or subacute infarctions. Few small areas of T2 shine through are present in the white matter. Chronic small-vessel ischemic changes are seen throughout the pons. No focal cerebellar insult. Cerebral hemispheres show confluent chronic small vessel ischemic changes throughout the white matter. Old lacunar infarction right thalamus. No cortical or large vessel territory infarction. No  mass, hemorrhage, hydrocephalus or extra-axial collection. Vascular: Major vessels at the base of the brain show flow. Skull and upper cervical spine: Negative Sinuses/Orbits: Clear/normal Other: None IMPRESSION: No acute finding. Extensive chronic small-vessel ischemic changes throughout the brain as outlined above. Electronically Signed   By: Paulina Fusi M.D.   On: 09/30/2022 15:01   CT HEAD WO CONTRAST  Result Date: 09/30/2022 CLINICAL DATA:  Stroke suspected EXAM: CT HEAD WITHOUT CONTRAST TECHNIQUE: Contiguous axial images were obtained from the base of the skull through the vertex without intravenous contrast. RADIATION DOSE REDUCTION: This exam was performed according to the departmental dose-optimization program which includes automated exposure control, adjustment of the mA and/or kV according to patient size and/or use of iterative reconstruction technique. COMPARISON:  07/11/2022 FINDINGS: Brain: No evidence of acute infarction, hemorrhage, hydrocephalus, extra-axial collection or mass lesion/mass effect. Patchy low-density changes within the periventricular and subcortical white matter most compatible with chronic microvascular ischemic change. Similar diffuse cerebral volume loss. Vascular: Atherosclerotic calcifications involving the large vessels of the skull base. No unexpected hyperdense vessel. Skull: Normal. Negative for fracture or focal lesion. Sinuses/Orbits: No acute finding. Other: None. IMPRESSION: 1. No acute intracranial findings. 2. Moderate chronic microvascular ischemic change and cerebral volume loss. Electronically Signed   By: Duanne Guess D.O.   On: 09/30/2022 12:01   DG Chest Portable 1 View  Result Date: 09/30/2022 CLINICAL DATA:  Missed dialysis session.  Altered mental status. EXAM: PORTABLE CHEST 1 VIEW COMPARISON:  Chest radiograph dated 09/08/2019 FINDINGS: Interval removal of central venous catheter. Normal lung volumes. No focal consolidations. No pleural effusion  or pneumothorax. Similar mildly enlarged cardiomediastinal silhouette. No acute osseous abnormality. Upper abdominal vascular calcifications. IMPRESSION: 1. No acute cardiopulmonary process. 2. Similar mildly enlarged cardiomediastinal silhouette. Electronically Signed   By: Agustin Cree M.D.   On: 09/30/2022 11:54      Scheduled Meds:  amLODipine  5 mg Oral QHS   aspirin EC  81 mg Oral Daily   atorvastatin  40 mg Oral Daily   Chlorhexidine Gluconate Cloth  6 each Topical Q0600   donepezil  5 mg Oral QHS   heparin  5,000 Units Subcutaneous Q8H   irbesartan  150 mg Oral Daily   sodium chloride flush  3 mL Intravenous Once   sodium chloride flush  3 mL Intravenous Q12H   sodium chloride flush  3 mL Intravenous Q12H   Continuous Infusions:  sodium chloride     cefTRIAXone (ROCEPHIN)  IV 1 g (10/01/22 1339)     LOS: 0 days   Time spent: 25 minutes   Noralee Stain, DO [facility] 10/01/2022, 1:42 PM   Available via Epic secure chat 7am-7pm After these hours, please  refer to coverage provider listed on [email or web address]

## 2022-10-01 NOTE — Progress Notes (Signed)
Patient was a no-show for today's scheduled appointment.

## 2022-10-01 NOTE — Progress Notes (Signed)
   10/01/22 1228  Vitals  Temp 97.7 F (36.5 C)  Pulse Rate 66  Resp 20  BP (!) 145/67  SpO2 100 %  O2 Device Room Air  Type of Weight Post-Dialysis  Post Treatment  Dialyzer Clearance Lightly streaked  Duration of HD Treatment -hour(s) 3 hour(s)  Hemodialysis Intake (mL) 0 mL  Fluid Removed (mL) 1304.05 mL  Tolerated HD Treatment No (Comment)   Received patient in bed to unit.  Alert and oriented.  Informed consent signed and in chart.   TX duration:3hrs  Patient tolerated well.  Transported back to the room  Alert, without acute distress.  Hand-off given to patient's nurse.   Access used: LAVF Access issues: none  Total UF removed: 1.3L pt unable UF due to feeling bad bp stable Medication(s) given: none   Na'Shaminy T  [facility]

## 2022-10-01 NOTE — Progress Notes (Signed)
PT Cancellation Note  Patient Details Name: Bailey [name] MRN: [id number] DOB: [birth year]   Cancelled Treatment:    Reason Eval/Treat Not Completed: Patient at procedure or test/unavailable. Pt in HD. Will try again later today.   [name] [facility] 10/01/2022, 9:25 AM Skip Mayer PT Acute [employer] [phone]

## 2022-10-01 NOTE — Progress Notes (Signed)
Pt receives out-pt HD at [facility] on MWF. Will assist as needed.   Tracy Mounce Renal Navigator [phone] 

## 2022-10-01 NOTE — Procedures (Addendum)
Patient Name: Bailey [name]  MRN: [id number]  Epilepsy Attending: Charlsie Quest  Referring Physician/Provider: Noralee Stain, DO  Date: 09/30/2022  Duration: 37.25 mins  Patient history: 72yo F with ams getting eeg to evaluate for seizure.  Level of alertness: Awake  AEDs during EEG study: None  Technical aspects: This EEG study was done with scalp electrodes positioned according to the 10-20 International system of electrode placement. Electrical activity was reviewed with band pass filter of 1-70Hz , sensitivity of 7 uV/mm, display speed of 45mm/sec with a 60Hz  notched filter applied as appropriate. EEG data were recorded continuously and digitally stored.  Video monitoring was available and reviewed as appropriate.  Description: The posterior dominant rhythm consists of 7.5 Hz activity of moderate voltage (25-35 uV) seen predominantly in posterior head regions, symmetric and reactive to eye opening and eye closing. Hyperventilation and photic stimulation were not performed.     IMPRESSION: This study is within normal limits. No seizures or epileptiform discharges were seen throughout the recording.  A normal interictal EEG does not exclude the diagnosis of epilepsy.   Annabelle Harman

## 2022-10-01 NOTE — Progress Notes (Signed)
[facility] Progress Note  Subjective: seen in HD, no c/o's. Pt is Ox 3  Vitals:   10/01/22 1200 10/01/22 1228 10/01/22 1300 10/01/22 1355  BP: (!) 149/60 (!) 145/67  (!) 165/65  Pulse: 66 66  69  Resp: 13 20    Temp:  97.7 F (36.5 C)  98.6 F (37 C)  TempSrc:  Oral  Oral  SpO2: 100% 100%  99%  Weight:  46 kg    Height:   5\' 4"  (1.626 m)     Exam: Gen alert, no distress No jvd or bruits Chest clear bilat to bases RRR no MRG Abd soft ntnd no mass or ascites +bs Ext no LE edema Neuro is alert, Ox 3 , nf    LUA AVF+bruit     Home meds include - norvasc 5, aspirin, aotrvastatin, auryxia 210 ac tid, clopidogrel, donepezil, renavite, olmesartan 150 qd        OP HD: MWF SW 3.5h  350/500  47.6kg  2/2 bath  AVF   Heparin none - last OP HD 8/05, post wt 46.1kg - hectorol 4 mcg IV three times per week - mircera 50 mcg IV q 2 wks, last 7/29, due 8/12     Assessment/ Plan: Altered mental status - MRI w/o acute findings, chronic WM changes. Prior CVA may have been re-triggered by infection (UTI) per pmd. Much better.  ESRD - on HD MWF. Last HD Monday. Had HD here this am. Next HD tomorrow (inpt vs outpt).  HTN/ volume - bp's high-normal, cont home norvasc and ARB. No gross vol overload. At dry wt.  Anemia esrd - Hb >10, no esa needs. See above.  MBD ckd - CCa in range, cont binder and IV vdra.  Pyuria - possible UTI, getting rocephin H/o CVA - on asa, plavix Dispo - per pmd, probable dc in the morning tomorrow.        Vinson Moselle MD  [facility] 10/01/2022, 4:19 PM  Recent Labs  Lab 09/30/22 1016 09/30/22 1025 10/01/22 0035  HGB 11.9* 12.2 10.1*  ALBUMIN 3.5  --   --   CALCIUM 9.1  --  8.5*  CREATININE 5.50* 6.40* 7.05*  K 4.4 4.4 4.0   No results for input(s): "IRON", "TIBC", "FERRITIN" in the last 168 hours. Inpatient medications:  amLODipine  5 mg Oral QHS   aspirin EC  81 mg Oral Daily   atorvastatin  40 mg Oral Daily   Chlorhexidine Gluconate  Cloth  6 each Topical Q0600   donepezil  5 mg Oral QHS   heparin  5,000 Units Subcutaneous Q8H   irbesartan  150 mg Oral Daily   sodium chloride flush  3 mL Intravenous Once   sodium chloride flush  3 mL Intravenous Q12H   sodium chloride flush  3 mL Intravenous Q12H    sodium chloride     cefTRIAXone (ROCEPHIN)  IV 1 g (10/01/22 1339)   sodium chloride, acetaminophen **OR** acetaminophen, ondansetron **OR** ondansetron (ZOFRAN) IV, polyethylene glycol, sodium chloride flush

## 2022-10-01 NOTE — TOC Initial Note (Signed)
Transition of Care (TOC) - Initial/Assessment Note   Patient has walker and canes at home.   Has had HHPT/OT with [facility] in the past and would like them again. Morrie Sheldon with [facility] accepted referral   Entered orders asked MD to sign  Patient Details  Name: Bailey [name] MRN: [id number] Date of Birth: May 11, 1950  Transition of Care [facility]) CM/SW Contact:    Kingsley Plan, RN Phone Number: 10/01/2022, 4:23 PM  Clinical Narrative:                   Expected Discharge Plan: Home w Home Health Services Barriers to Discharge: Continued Medical Work up   Patient Goals and CMS Choice Patient states their goals for this hospitalization and ongoing recovery are:: to return to home CMS Medicare.gov Compare Post Acute Care list provided to:: Patient Choice offered to / list presented to : Patient [place] ownership interest in [facility].provided to:: Patient    Expected Discharge Plan and Services   Discharge Planning Services: CM Consult Post Acute Care Choice: Home Health Living arrangements for the past 2 months: Single Family Home                 DME Arranged: N/A         HH Arranged: PT, OT HH Agency: [facility] ([facility]) Date HH Agency Contacted: 10/01/22 Time HH Agency Contacted: 1623 Representative spoke with at [facility] Agency: Morrie Sheldon  Prior Living Arrangements/Services Living arrangements for the past 2 months: Single Family Home Lives with:: Spouse Patient language and need for interpreter reviewed:: Yes Do you feel safe going back to the place where you live?: Yes      Need for Family Participation in Patient Care: Yes (Comment) Care giver support system in place?: Yes (comment) Current home services: DME Criminal Activity/Legal Involvement Pertinent to Current Situation/Hospitalization: No - Comment as needed  Activities of Daily Living Home Assistive Devices/Equipment: Eyeglasses, Dentures (specify type), Shower chair with back  (upper dentures) ADL Screening (condition at time of admission) Patient's cognitive ability adequate to safely complete daily activities?: Yes Is the patient deaf or have difficulty hearing?: No Does the patient have difficulty seeing, even when wearing glasses/contacts?: No Does the patient have difficulty concentrating, remembering, or making decisions?: Yes Patient able to express need for assistance with ADLs?: Yes Does the patient have difficulty dressing or bathing?: Yes Independently performs ADLs?: No Does the patient have difficulty walking or climbing stairs?: No Weakness of Legs: None Weakness of Arms/Hands: None  Permission Sought/Granted   Permission granted to share information with : No              Emotional Assessment Appearance:: Appears stated age Attitude/Demeanor/Rapport: Engaged Affect (typically observed): Accepting Orientation: : Oriented to Self, Oriented to Place, Oriented to  Time, Oriented to Situation Alcohol / Substance Use: Not Applicable Psych Involvement: No (comment)  Admission diagnosis:  Lower urinary tract infectious disease [N39.0] Acute metabolic encephalopathy [G93.41] Neurovascular deficit [R29.818, R09.89] UTI (urinary tract infection) [N39.0] Patient Active Problem List   Diagnosis Date Noted   UTI (urinary tract infection) 10/01/2022   Acute metabolic encephalopathy 09/30/2022   HLD (hyperlipidemia) 09/30/2022   History of CVA (cerebrovascular accident) 09/30/2022   Essential hypertension 07/13/2022   Acute CVA (cerebrovascular accident) (HCC) 07/11/2022   Pyelonephritis 04/27/2021   Malnutrition of moderate degree 09/05/2019   Hypokalemia 09/02/2019   Fever of unknown origin 09/02/2019   ESRD on dialysis (HCC) 08/20/2019   Symptomatic anemia  08/20/2019   Thrombocytopenia (HCC) 08/20/2019   CKD (chronic kidney disease) 05/23/2019   Type II diabetes mellitus, uncontrolled 12/07/2017   History of anemia due to chronic kidney  disease 10/11/2017   Vitamin B 12 deficiency 06/09/2017   Great toe amputation status, right 09/04/2016   Osteomyelitis of toe of right foot (HCC)    Acute renal failure (ARF) (HCC) 05/10/2016   SBO (small bowel obstruction) (HCC) 05/08/2016   Diverticular disease of jejunum s/p resection 05/09/2016 05/08/2016   Normocytic anemia 05/05/2016   PCP:  Lewis Moccasin, MD Pharmacy:   [facility] [phone] - [place], [place] - [address] [place] [address] Phone: [phone] Fax: [phone]  [id number] [place], [place] - [address] AT [facility] & [address] [place] [address] Phone: [phone] Fax: [phone]  [facility] [address] [place] [address] Phone: [phone] Fax: [phone]     Social Determinants of Health (SDOH) Social History: SDOH Screenings   Food Insecurity: No Food Insecurity (09/30/2022)  Housing: Low Risk  (09/30/2022)  Transportation Needs: No Transportation Needs (09/30/2022)  Utilities: Not At Risk (09/30/2022)  Tobacco Use: Low Risk  (09/30/2022)   SDOH Interventions:     Readmission Risk Interventions     No data to display

## 2022-10-02 DIAGNOSIS — D631 Anemia in chronic kidney disease: Secondary | ICD-10-CM | POA: Diagnosis not present

## 2022-10-02 DIAGNOSIS — N189 Chronic kidney disease, unspecified: Secondary | ICD-10-CM | POA: Diagnosis not present

## 2022-10-02 DIAGNOSIS — Z992 Dependence on renal dialysis: Secondary | ICD-10-CM | POA: Diagnosis not present

## 2022-10-02 DIAGNOSIS — N186 End stage renal disease: Secondary | ICD-10-CM | POA: Diagnosis not present

## 2022-10-02 DIAGNOSIS — N2581 Secondary hyperparathyroidism of renal origin: Secondary | ICD-10-CM | POA: Diagnosis not present

## 2022-10-02 DIAGNOSIS — G9341 Metabolic encephalopathy: Secondary | ICD-10-CM | POA: Diagnosis not present

## 2022-10-02 DIAGNOSIS — E1122 Type 2 diabetes mellitus with diabetic chronic kidney disease: Secondary | ICD-10-CM | POA: Diagnosis not present

## 2022-10-02 MED ORDER — ASPIRIN 81 MG PO TBEC: 81.0000 mg | DELAYED_RELEASE_TABLET | Freq: Every day | ORAL | 0 refills | Status: DC

## 2022-10-02 NOTE — Discharge Planning (Signed)
[facility] Patient Discharge Orders- [facility]: [facility]  Patient's name: Bailey [name] Admit/DC Dates: 09/30/2022 -10/02/2022  Discharge Diagnoses: AMS  UTI  Aranesp: Given: No   Date and amount of last dose: NA  Last Hgb: 10.1 PRBC's Given: No Date/# of units: NA ESA dose for discharge: mircera 50 mcg IV q 2 weeks  IV Iron dose at discharge: Per protocol  Heparin change: NA  EDW Change: No New EDW: NA  Bath Change: NA  Access intervention/Change: NA Details:  Hectorol/Calcitriol change: NA  Discharge Labs: Calcium 8.5 Phosphorus NA Albumin 3.5 K+ 4.0  IV Antibiotics: NA Details:  On Coumadin?: NA Last INR: Next INR: Managed By:   OTHER/APPTS/LAB ORDERS:    D/C Meds to be reconciled by nurse after every discharge.  Completed By: Alonna Buckler [facility] [phone]    Reviewed by: MD:______ RN_______

## 2022-10-02 NOTE — Plan of Care (Signed)
?  Problem: Clinical Measurements: ?Goal: Ability to maintain clinical measurements within normal limits will improve ?Outcome: Progressing ?Goal: Will remain free from infection ?Outcome: Progressing ?Goal: Diagnostic test results will improve ?Outcome: Progressing ?  ?

## 2022-10-02 NOTE — Progress Notes (Addendum)
Contacted by attending and nephrologist yesterday afternoon that pt likely stable for d/c this morning. Contacted [facility] this am to confirm pt's normal MWF 12:25 chair time still available for today. Clinic confirms pt can be treated at normal time today. Update provided to attending and nephrologist. Will assist as needed.   Olivia Canter Renal Navigator [phone]  Addendum at 9:08 am: D/C order noted. Spoke to pt via phone to make her aware that her clinic can treat her at her normal time today if pt d/c this am. Pt agreeable and states husband can transport her from hospital to HD clinic at d/c. Pt states she will contact her husband shortly to provide him an update. Clinic aware pt should d/c this morning and come to clinic for treatment today.

## 2022-10-02 NOTE — Plan of Care (Signed)

## 2022-10-02 NOTE — Consult Note (Signed)
   [facility] CM Inpatient Consult   10/02/2022  Bailey [name] [birth year] [id number]  [facility] [[facility]]  Accountable Care Organization [ACO] Patient: Medicare ACO REACH insurance  *Patient had a recent referral from PCP last month notes reveals community Care Coordinator CMA was unable to reach   Primary Care Provider:  Lewis Moccasin, MD  which is listed to provide the transition of care follow up calls and appointments  Patient screened for 2 hospitalizations in 6 months with noted medium risk score for unplanned readmission risk and to assess for potential follow up from referral [facility]  [[facility]] Care Management service needs for post hospital transition for care coordination.  Review of patient's electronic medical record reveals patient is transition home today but going to HD facility noted.  9:50 am Met with the patient at the bedside to check for ongoing gommunity care coordination need.  SDOH reviewed and patient voices no issues.   Plan:  Continue to follow progress and disposition to assess for post hospital community care coordination/management needs.  Referral request for community care coordination: referral noted prior to admission will follow up  Of note, [facility] Care Management/Population Health does not replace or interfere with any arrangements made by the Inpatient Transition of Care team.  For questions contact:   Charlesetta Shanks, RN BSN CCM [facility]  [phone] business mobile phone Toll free office [phone]  *Concierge Line  [phone] Fax number: [phone] [email or web address]

## 2022-10-02 NOTE — Progress Notes (Signed)
 Discharges instructions given. Patient verbalized understanding and all questions were answered.

## 2022-10-02 NOTE — Discharge Summary (Signed)
Physician Discharge Summary  Bailey [name] [id number] DOB: 1950/08/03 DOA: 09/30/2022  PCP: Lewis Moccasin, MD  Admit date: 09/30/2022 Discharge date: 10/02/2022  Admitted From: Home Disposition:  Home  Recommendations for Outpatient Follow-up:  Follow up with PCP in 1 week Follow up with Neurology for stroke follow up from hospitalization in May   Discharge Condition: Stable CODE STATUS: Full  Diet recommendation: Renal   Brief/Interim Summary: Bailey [name] is a 72 y.o. female with medical history significant of ESRD on HD MWF, DM, HTN, who presents to the ED 09/30/22  due to confusion. Husband states that when she woke up this morning, she seemed to be confused, complained of a headache.  She then became unresponsive, was alert but staring off into the space and not responding to his questions.  This episode lasted about 25 to 30 minutes.  Once EMS arrived, patient was more interactive, but did not recognize husband, was pushing him away.  In the emergency department on my evaluation, patient has returned back to her baseline, answering questions appropriately and following all commands.  Patient denies any recent illnesses, makes little urine, but denies any dysuria or pelvic discomfort.   Case was discussed with neurology, who felt that her symptoms were consistent with recrudescence of prior stroke.  EEG was unremarkable.  Patient did not have any recurrent symptoms.  She was encouraged to follow-up with neurology outpatient, does not appear she followed up with them after her stroke in May.  She was also resumed on her aspirin.  Discharge Diagnoses:   Principal Problem:   Acute metabolic encephalopathy Active Problems:   ESRD on dialysis (HCC)   Essential hypertension   HLD (hyperlipidemia)   History of CVA (cerebrovascular accident)   UTI (urinary tract infection)    Acute metabolic encephalopathy -CT head negative for acute intracranial findings, moderate chronic  microvascular ischemic changes and cerebral volume loss -MRI brain no acute finding, extensive chronic small vessel ischemic changes -Question UTI causing recrudescence of her prior stroke in setting of infection. EDP discussed with Neurology  -EEG negative  -Resolved   Pyuria -UA with large leukocytes, negative nitrite, >50 WBC -Urine culture pending  -Empiric Rocephin x 3 doses    ESRD -HD MWF   Hypertension -Norvasc, Benicar   Hyperlipidemia -Lipitor   Hx CVA  -Aspirin, Plavix --> Aspirin.  Reviewed discharge summary from May 2024.  At that time, they had planned for DAPT for 3 weeks then transition to aspirin monotherapy.  Discharge Instructions  Discharge Instructions     Ambulatory referral to Neurology   Complete by: As directed    An appointment is requested in approximately: 1week. Stroke in May 2024, don't see that she followed up with neuro from that admission. Now admitted and discharged with encephalopathy episode. Needs close follow up   Call MD for:  difficulty breathing, headache or visual disturbances   Complete by: As directed    Call MD for:  extreme fatigue   Complete by: As directed    Call MD for:  persistant dizziness or light-headedness   Complete by: As directed    Call MD for:  persistant nausea and vomiting   Complete by: As directed    Call MD for:  severe uncontrolled pain   Complete by: As directed    Call MD for:  temperature >100.4   Complete by: As directed    Discharge instructions   Complete by: As directed    You were cared for  by a hospitalist during your hospital stay. If you have any questions about your discharge medications or the care you received while you were in the hospital after you are discharged, you can call the unit and ask to speak with the hospitalist on call if the hospitalist that took care of you is not available. Once you are discharged, your primary care physician will handle any further medical issues. Please note  that NO REFILLS for any discharge medications will be authorized once you are discharged, as it is imperative that you return to your primary care physician (or establish a relationship with a primary care physician if you do not have one) for your aftercare needs so that they can reassess your need for medications and monitor your lab values.   Increase activity slowly   Complete by: As directed       Allergies as of 10/02/2022   No Known Allergies      Medication List     STOP taking these medications    clopidogrel 75 MG tablet Commonly known as: PLAVIX       TAKE these medications    amLODipine 5 MG tablet Commonly known as: NORVASC Take 1 tablet (5 mg total) by mouth at bedtime.   aspirin EC 81 MG tablet Take 1 tablet (81 mg total) by mouth daily. Swallow whole.   atorvastatin 40 MG tablet Commonly known as: LIPITOR TAKE 1 TABLET(40 MG) BY MOUTH DAILY   Auryxia 1 GM 210 MG(Fe) tablet Generic drug: ferric citrate Take 210 mg by mouth 3 (three) times daily.   donepezil 5 MG tablet Commonly known as: ARICEPT Take 1/2 pill daily for 4 weeks, then increase to 1 pill daily   multivitamin Tabs tablet Take 1 tablet by mouth daily.   olmesartan 40 MG tablet Commonly known as: BENICAR Take 1/2 tablet (20 mg total) by mouth daily.        Follow-up Information     [facility] Follow up.   Contact information: [address] [place] [address] [phone]                No Known Allergies    Procedures/Studies: EEG adult  Result Date: 29-Oct-2022 Charlsie Quest, MD     10-29-2022  9:12 AM Patient Name: Bailey [name] MRN: [id number] Epilepsy Attending: Charlsie Quest Referring Physician/Provider: Noralee Stain, DO Date: 09/30/2022 Duration: 37.25 mins Patient history: 72yo F with ams getting eeg to evaluate for seizure. Level of alertness: Awake AEDs during EEG study: None Technical aspects: This EEG study was done  with scalp electrodes positioned according to the 10-20 International system of electrode placement. Electrical activity was reviewed with band pass filter of 1-70Hz , sensitivity of 7 uV/mm, display speed of 34mm/sec with a 60Hz  notched filter applied as appropriate. EEG data were recorded continuously and digitally stored.  Video monitoring was available and reviewed as appropriate. Description: The posterior dominant rhythm consists of 7.5 Hz activity of moderate voltage (25-35 uV) seen predominantly in posterior head regions, symmetric and reactive to eye opening and eye closing. Hyperventilation and photic stimulation were not performed.   IMPRESSION: This study is within normal limits. No seizures or epileptiform discharges were seen throughout the recording. A normal interictal EEG does not exclude the diagnosis of epilepsy. Charlsie Quest   MR BRAIN WO CONTRAST  Result Date: 09/30/2022 CLINICAL DATA:  Neuro deficit, acute, stroke suspected. End-stage renal disease. EXAM: MRI HEAD WITHOUT CONTRAST TECHNIQUE:  Multiplanar, multiecho pulse sequences of the brain and surrounding structures were obtained without intravenous contrast. COMPARISON:  CT same day.  MRI 07/11/2022. FINDINGS: Brain: Diffusion imaging does not show any acute or subacute infarctions. Few small areas of T2 shine through are present in the white matter. Chronic small-vessel ischemic changes are seen throughout the pons. No focal cerebellar insult. Cerebral hemispheres show confluent chronic small vessel ischemic changes throughout the white matter. Old lacunar infarction right thalamus. No cortical or large vessel territory infarction. No mass, hemorrhage, hydrocephalus or extra-axial collection. Vascular: Major vessels at the base of the brain show flow. Skull and upper cervical spine: Negative Sinuses/Orbits: Clear/normal Other: None IMPRESSION: No acute finding. Extensive chronic small-vessel ischemic changes throughout the brain as  outlined above. Electronically Signed   By: Paulina Fusi M.D.   On: 09/30/2022 15:01   CT HEAD WO CONTRAST  Result Date: 09/30/2022 CLINICAL DATA:  Stroke suspected EXAM: CT HEAD WITHOUT CONTRAST TECHNIQUE: Contiguous axial images were obtained from the base of the skull through the vertex without intravenous contrast. RADIATION DOSE REDUCTION: This exam was performed according to the departmental dose-optimization program which includes automated exposure control, adjustment of the mA and/or kV according to patient size and/or use of iterative reconstruction technique. COMPARISON:  07/11/2022 FINDINGS: Brain: No evidence of acute infarction, hemorrhage, hydrocephalus, extra-axial collection or mass lesion/mass effect. Patchy low-density changes within the periventricular and subcortical white matter most compatible with chronic microvascular ischemic change. Similar diffuse cerebral volume loss. Vascular: Atherosclerotic calcifications involving the large vessels of the skull base. No unexpected hyperdense vessel. Skull: Normal. Negative for fracture or focal lesion. Sinuses/Orbits: No acute finding. Other: None. IMPRESSION: 1. No acute intracranial findings. 2. Moderate chronic microvascular ischemic change and cerebral volume loss. Electronically Signed   By: Duanne Guess D.O.   On: 09/30/2022 12:01   DG Chest Portable 1 View  Result Date: 09/30/2022 CLINICAL DATA:  Missed dialysis session.  Altered mental status. EXAM: PORTABLE CHEST 1 VIEW COMPARISON:  Chest radiograph dated 09/08/2019 FINDINGS: Interval removal of central venous catheter. Normal lung volumes. No focal consolidations. No pleural effusion or pneumothorax. Similar mildly enlarged cardiomediastinal silhouette. No acute osseous abnormality. Upper abdominal vascular calcifications. IMPRESSION: 1. No acute cardiopulmonary process. 2. Similar mildly enlarged cardiomediastinal silhouette. Electronically Signed   By: Agustin Cree M.D.   On:  09/30/2022 11:54      Discharge Exam: Vitals:   10/02/22 0513 10/02/22 0719  BP: (!) 163/63 (!) 160/60  Pulse: 65 63  Resp:  14  Temp: 98 F (36.7 C) 97.9 F (36.6 C)  SpO2: 100% 100%    General: Pt is alert, awake, not in acute distress Cardiovascular: RRR, S1/S2 +, no edema, +murmur  Respiratory: CTA bilaterally, no wheezing, no rhonchi, no respiratory distress, no conversational dyspnea  Abdominal: Soft, NT, ND, bowel sounds + Extremities: no edema, no cyanosis Psych: Normal mood and affect, stable judgement and insight     The results of significant diagnostics from this hospitalization (including imaging, microbiology, ancillary and laboratory) are listed below for reference.     Microbiology: No results found for this or any previous visit (from the past 240 hour(s)).   Labs: BNP (last 3 results) No results for input(s): "BNP" in the last 8760 hours. Basic Metabolic Panel: Recent Labs  Lab 09/30/22 1016 09/30/22 1025 10/01/22 0035  NA 137 139 138  K 4.4 4.4 4.0  CL 94* 100 94*  CO2 25  --  29  GLUCOSE 94 97  192*  BUN 47* 47* 61*  CREATININE 5.50* 6.40* 7.05*  CALCIUM 9.1  --  8.5*   Liver Function Tests: Recent Labs  Lab 09/30/22 1016  AST 20  ALT 13  ALKPHOS 104  BILITOT 0.5  PROT 6.9  ALBUMIN 3.5   No results for input(s): "LIPASE", "AMYLASE" in the last 168 hours. No results for input(s): "AMMONIA" in the last 168 hours. CBC: Recent Labs  Lab 09/30/22 1016 09/30/22 1025 10/01/22 0035  WBC 6.6  --  6.4  NEUTROABS 3.4  --   --   HGB 11.9* 12.2 10.1*  HCT 37.9 36.0 31.4*  MCV 105.9*  --  100.6*  PLT 97*  --  96*   Cardiac Enzymes: No results for input(s): "CKTOTAL", "CKMB", "CKMBINDEX", "TROPONINI" in the last 168 hours. BNP: Invalid input(s): "POCBNP" CBG: Recent Labs  Lab 09/30/22 0956  GLUCAP 104*   D-Dimer No results for input(s): "DDIMER" in the last 72 hours. Hgb A1c No results for input(s): "HGBA1C" in the last 72  hours. Lipid Profile No results for input(s): "CHOL", "HDL", "LDLCALC", "TRIG", "CHOLHDL", "LDLDIRECT" in the last 72 hours. Thyroid function studies No results for input(s): "TSH", "T4TOTAL", "T3FREE", "THYROIDAB" in the last 72 hours.  Invalid input(s): "FREET3" Anemia work up No results for input(s): "VITAMINB12", "FOLATE", "FERRITIN", "TIBC", "IRON", "RETICCTPCT" in the last 72 hours. Urinalysis    Component Value Date/Time   COLORURINE YELLOW 09/30/2022 1016   APPEARANCEUR HAZY (A) 09/30/2022 1016   LABSPEC 1.009 09/30/2022 1016   PHURINE 9.0 (H) 09/30/2022 1016   GLUCOSEU >=500 (A) 09/30/2022 1016   HGBUR NEGATIVE 09/30/2022 1016   BILIRUBINUR NEGATIVE 09/30/2022 1016   BILIRUBINUR small 02/10/2014 1127   KETONESUR NEGATIVE 09/30/2022 1016   PROTEINUR >=300 (A) 09/30/2022 1016   UROBILINOGEN 1.0 02/10/2014 2233   NITRITE NEGATIVE 09/30/2022 1016   LEUKOCYTESUR LARGE (A) 09/30/2022 1016   Sepsis Labs Recent Labs  Lab 09/30/22 1016 10/01/22 0035  WBC 6.6 6.4   Microbiology No results found for this or any previous visit (from the past 240 hour(s)).   Patient was seen and examined on the day of discharge and was found to be in stable condition. Time coordinating discharge: 35 minutes including assessment and coordination of care, as well as examination of the patient.   SIGNED:  Noralee Stain, DO [facility] 10/02/2022, 8:58 AM

## 2022-10-05 DIAGNOSIS — N186 End stage renal disease: Secondary | ICD-10-CM | POA: Diagnosis not present

## 2022-10-05 DIAGNOSIS — E1122 Type 2 diabetes mellitus with diabetic chronic kidney disease: Secondary | ICD-10-CM | POA: Diagnosis not present

## 2022-10-05 DIAGNOSIS — N2581 Secondary hyperparathyroidism of renal origin: Secondary | ICD-10-CM | POA: Diagnosis not present

## 2022-10-05 DIAGNOSIS — Z992 Dependence on renal dialysis: Secondary | ICD-10-CM | POA: Diagnosis not present

## 2022-10-05 DIAGNOSIS — N390 Urinary tract infection, site not specified: Secondary | ICD-10-CM | POA: Diagnosis not present

## 2022-10-05 DIAGNOSIS — D631 Anemia in chronic kidney disease: Secondary | ICD-10-CM | POA: Diagnosis not present

## 2022-10-05 DIAGNOSIS — N189 Chronic kidney disease, unspecified: Secondary | ICD-10-CM | POA: Diagnosis not present

## 2022-10-06 DIAGNOSIS — I120 Hypertensive chronic kidney disease with stage 5 chronic kidney disease or end stage renal disease: Secondary | ICD-10-CM | POA: Diagnosis not present

## 2022-10-06 DIAGNOSIS — Z992 Dependence on renal dialysis: Secondary | ICD-10-CM | POA: Diagnosis not present

## 2022-10-06 DIAGNOSIS — N186 End stage renal disease: Secondary | ICD-10-CM | POA: Diagnosis not present

## 2022-10-06 DIAGNOSIS — F32A Depression, unspecified: Secondary | ICD-10-CM | POA: Diagnosis not present

## 2022-10-06 DIAGNOSIS — Z604 Social exclusion and rejection: Secondary | ICD-10-CM | POA: Diagnosis not present

## 2022-10-06 DIAGNOSIS — Z7982 Long term (current) use of aspirin: Secondary | ICD-10-CM | POA: Diagnosis not present

## 2022-10-06 DIAGNOSIS — E11319 Type 2 diabetes mellitus with unspecified diabetic retinopathy without macular edema: Secondary | ICD-10-CM | POA: Diagnosis not present

## 2022-10-06 DIAGNOSIS — E1122 Type 2 diabetes mellitus with diabetic chronic kidney disease: Secondary | ICD-10-CM | POA: Diagnosis not present

## 2022-10-06 DIAGNOSIS — N390 Urinary tract infection, site not specified: Secondary | ICD-10-CM | POA: Diagnosis not present

## 2022-10-06 DIAGNOSIS — I672 Cerebral atherosclerosis: Secondary | ICD-10-CM | POA: Diagnosis not present

## 2022-10-06 DIAGNOSIS — F0153 Vascular dementia, unspecified severity, with mood disturbance: Secondary | ICD-10-CM | POA: Diagnosis not present

## 2022-10-06 DIAGNOSIS — D631 Anemia in chronic kidney disease: Secondary | ICD-10-CM | POA: Diagnosis not present

## 2022-10-06 DIAGNOSIS — E1142 Type 2 diabetes mellitus with diabetic polyneuropathy: Secondary | ICD-10-CM | POA: Diagnosis not present

## 2022-10-06 DIAGNOSIS — I340 Nonrheumatic mitral (valve) insufficiency: Secondary | ICD-10-CM | POA: Diagnosis not present

## 2022-10-06 DIAGNOSIS — Z8673 Personal history of transient ischemic attack (TIA), and cerebral infarction without residual deficits: Secondary | ICD-10-CM | POA: Diagnosis not present

## 2022-10-06 DIAGNOSIS — M5136 Other intervertebral disc degeneration, lumbar region: Secondary | ICD-10-CM | POA: Diagnosis not present

## 2022-10-06 DIAGNOSIS — E440 Moderate protein-calorie malnutrition: Secondary | ICD-10-CM | POA: Diagnosis not present

## 2022-10-06 DIAGNOSIS — G9341 Metabolic encephalopathy: Secondary | ICD-10-CM | POA: Diagnosis not present

## 2022-10-06 DIAGNOSIS — Z7902 Long term (current) use of antithrombotics/antiplatelets: Secondary | ICD-10-CM | POA: Diagnosis not present

## 2022-10-06 DIAGNOSIS — E538 Deficiency of other specified B group vitamins: Secondary | ICD-10-CM | POA: Diagnosis not present

## 2022-10-06 DIAGNOSIS — E785 Hyperlipidemia, unspecified: Secondary | ICD-10-CM | POA: Diagnosis not present

## 2022-10-07 DIAGNOSIS — D631 Anemia in chronic kidney disease: Secondary | ICD-10-CM | POA: Diagnosis not present

## 2022-10-07 DIAGNOSIS — N186 End stage renal disease: Secondary | ICD-10-CM | POA: Diagnosis not present

## 2022-10-07 DIAGNOSIS — N2581 Secondary hyperparathyroidism of renal origin: Secondary | ICD-10-CM | POA: Diagnosis not present

## 2022-10-07 DIAGNOSIS — N189 Chronic kidney disease, unspecified: Secondary | ICD-10-CM | POA: Diagnosis not present

## 2022-10-07 DIAGNOSIS — E1122 Type 2 diabetes mellitus with diabetic chronic kidney disease: Secondary | ICD-10-CM | POA: Diagnosis not present

## 2022-10-07 DIAGNOSIS — Z992 Dependence on renal dialysis: Secondary | ICD-10-CM | POA: Diagnosis not present

## 2022-10-09 ENCOUNTER — Telehealth: Payer: Self-pay | Admitting: Podiatry

## 2022-10-09 DIAGNOSIS — N2581 Secondary hyperparathyroidism of renal origin: Secondary | ICD-10-CM | POA: Diagnosis not present

## 2022-10-09 DIAGNOSIS — D631 Anemia in chronic kidney disease: Secondary | ICD-10-CM | POA: Diagnosis not present

## 2022-10-09 DIAGNOSIS — N186 End stage renal disease: Secondary | ICD-10-CM | POA: Diagnosis not present

## 2022-10-09 DIAGNOSIS — N189 Chronic kidney disease, unspecified: Secondary | ICD-10-CM | POA: Diagnosis not present

## 2022-10-09 DIAGNOSIS — Z992 Dependence on renal dialysis: Secondary | ICD-10-CM | POA: Diagnosis not present

## 2022-10-09 DIAGNOSIS — E1122 Type 2 diabetes mellitus with diabetic chronic kidney disease: Secondary | ICD-10-CM | POA: Diagnosis not present

## 2022-10-09 NOTE — Telephone Encounter (Signed)
Lmom for pt to call back to schedule picking up diabetic shoes   

## 2022-10-12 DIAGNOSIS — E1122 Type 2 diabetes mellitus with diabetic chronic kidney disease: Secondary | ICD-10-CM | POA: Diagnosis not present

## 2022-10-12 DIAGNOSIS — Z992 Dependence on renal dialysis: Secondary | ICD-10-CM | POA: Diagnosis not present

## 2022-10-12 DIAGNOSIS — D631 Anemia in chronic kidney disease: Secondary | ICD-10-CM | POA: Diagnosis not present

## 2022-10-12 DIAGNOSIS — N2581 Secondary hyperparathyroidism of renal origin: Secondary | ICD-10-CM | POA: Diagnosis not present

## 2022-10-12 DIAGNOSIS — N189 Chronic kidney disease, unspecified: Secondary | ICD-10-CM | POA: Diagnosis not present

## 2022-10-12 DIAGNOSIS — N186 End stage renal disease: Secondary | ICD-10-CM | POA: Diagnosis not present

## 2022-10-12 NOTE — Telephone Encounter (Signed)
Left message with husband to call back to schedule picking up diabetic shoes

## 2022-10-13 DIAGNOSIS — Z992 Dependence on renal dialysis: Secondary | ICD-10-CM | POA: Diagnosis not present

## 2022-10-13 DIAGNOSIS — E1122 Type 2 diabetes mellitus with diabetic chronic kidney disease: Secondary | ICD-10-CM | POA: Diagnosis not present

## 2022-10-13 DIAGNOSIS — E782 Mixed hyperlipidemia: Secondary | ICD-10-CM | POA: Diagnosis not present

## 2022-10-13 DIAGNOSIS — I129 Hypertensive chronic kidney disease with stage 1 through stage 4 chronic kidney disease, or unspecified chronic kidney disease: Secondary | ICD-10-CM | POA: Diagnosis not present

## 2022-10-13 DIAGNOSIS — E1121 Type 2 diabetes mellitus with diabetic nephropathy: Secondary | ICD-10-CM | POA: Diagnosis not present

## 2022-10-13 DIAGNOSIS — N186 End stage renal disease: Secondary | ICD-10-CM | POA: Diagnosis not present

## 2022-10-13 DIAGNOSIS — I120 Hypertensive chronic kidney disease with stage 5 chronic kidney disease or end stage renal disease: Secondary | ICD-10-CM | POA: Diagnosis not present

## 2022-10-13 DIAGNOSIS — I10 Essential (primary) hypertension: Secondary | ICD-10-CM | POA: Diagnosis not present

## 2022-10-13 DIAGNOSIS — E1165 Type 2 diabetes mellitus with hyperglycemia: Secondary | ICD-10-CM | POA: Diagnosis not present

## 2022-10-13 DIAGNOSIS — E538 Deficiency of other specified B group vitamins: Secondary | ICD-10-CM | POA: Diagnosis not present

## 2022-10-13 DIAGNOSIS — D631 Anemia in chronic kidney disease: Secondary | ICD-10-CM | POA: Diagnosis not present

## 2022-10-13 DIAGNOSIS — G9341 Metabolic encephalopathy: Secondary | ICD-10-CM | POA: Diagnosis not present

## 2022-10-13 DIAGNOSIS — N184 Chronic kidney disease, stage 4 (severe): Secondary | ICD-10-CM | POA: Diagnosis not present

## 2022-10-13 DIAGNOSIS — Z23 Encounter for immunization: Secondary | ICD-10-CM | POA: Diagnosis not present

## 2022-10-14 DIAGNOSIS — N186 End stage renal disease: Secondary | ICD-10-CM | POA: Diagnosis not present

## 2022-10-14 DIAGNOSIS — Z992 Dependence on renal dialysis: Secondary | ICD-10-CM | POA: Diagnosis not present

## 2022-10-14 DIAGNOSIS — N189 Chronic kidney disease, unspecified: Secondary | ICD-10-CM | POA: Diagnosis not present

## 2022-10-14 DIAGNOSIS — D631 Anemia in chronic kidney disease: Secondary | ICD-10-CM | POA: Diagnosis not present

## 2022-10-14 DIAGNOSIS — E1122 Type 2 diabetes mellitus with diabetic chronic kidney disease: Secondary | ICD-10-CM | POA: Diagnosis not present

## 2022-10-14 DIAGNOSIS — N2581 Secondary hyperparathyroidism of renal origin: Secondary | ICD-10-CM | POA: Diagnosis not present

## 2022-10-15 DIAGNOSIS — I120 Hypertensive chronic kidney disease with stage 5 chronic kidney disease or end stage renal disease: Secondary | ICD-10-CM | POA: Diagnosis not present

## 2022-10-15 DIAGNOSIS — G9341 Metabolic encephalopathy: Secondary | ICD-10-CM | POA: Diagnosis not present

## 2022-10-15 DIAGNOSIS — N186 End stage renal disease: Secondary | ICD-10-CM | POA: Diagnosis not present

## 2022-10-15 DIAGNOSIS — D631 Anemia in chronic kidney disease: Secondary | ICD-10-CM | POA: Diagnosis not present

## 2022-10-15 DIAGNOSIS — Z992 Dependence on renal dialysis: Secondary | ICD-10-CM | POA: Diagnosis not present

## 2022-10-15 DIAGNOSIS — E1122 Type 2 diabetes mellitus with diabetic chronic kidney disease: Secondary | ICD-10-CM | POA: Diagnosis not present

## 2022-10-16 DIAGNOSIS — N189 Chronic kidney disease, unspecified: Secondary | ICD-10-CM | POA: Diagnosis not present

## 2022-10-16 DIAGNOSIS — D631 Anemia in chronic kidney disease: Secondary | ICD-10-CM | POA: Diagnosis not present

## 2022-10-16 DIAGNOSIS — N186 End stage renal disease: Secondary | ICD-10-CM | POA: Diagnosis not present

## 2022-10-16 DIAGNOSIS — E1122 Type 2 diabetes mellitus with diabetic chronic kidney disease: Secondary | ICD-10-CM | POA: Diagnosis not present

## 2022-10-16 DIAGNOSIS — Z992 Dependence on renal dialysis: Secondary | ICD-10-CM | POA: Diagnosis not present

## 2022-10-16 DIAGNOSIS — N2581 Secondary hyperparathyroidism of renal origin: Secondary | ICD-10-CM | POA: Diagnosis not present

## 2022-10-19 DIAGNOSIS — E1122 Type 2 diabetes mellitus with diabetic chronic kidney disease: Secondary | ICD-10-CM | POA: Diagnosis not present

## 2022-10-19 DIAGNOSIS — D631 Anemia in chronic kidney disease: Secondary | ICD-10-CM | POA: Diagnosis not present

## 2022-10-19 DIAGNOSIS — N189 Chronic kidney disease, unspecified: Secondary | ICD-10-CM | POA: Diagnosis not present

## 2022-10-19 DIAGNOSIS — N186 End stage renal disease: Secondary | ICD-10-CM | POA: Diagnosis not present

## 2022-10-19 DIAGNOSIS — N2581 Secondary hyperparathyroidism of renal origin: Secondary | ICD-10-CM | POA: Diagnosis not present

## 2022-10-19 DIAGNOSIS — Z992 Dependence on renal dialysis: Secondary | ICD-10-CM | POA: Diagnosis not present

## 2022-10-20 DIAGNOSIS — I120 Hypertensive chronic kidney disease with stage 5 chronic kidney disease or end stage renal disease: Secondary | ICD-10-CM | POA: Diagnosis not present

## 2022-10-20 DIAGNOSIS — E1122 Type 2 diabetes mellitus with diabetic chronic kidney disease: Secondary | ICD-10-CM | POA: Diagnosis not present

## 2022-10-20 DIAGNOSIS — E1142 Type 2 diabetes mellitus with diabetic polyneuropathy: Secondary | ICD-10-CM | POA: Diagnosis not present

## 2022-10-20 DIAGNOSIS — E11319 Type 2 diabetes mellitus with unspecified diabetic retinopathy without macular edema: Secondary | ICD-10-CM | POA: Diagnosis not present

## 2022-10-20 DIAGNOSIS — E440 Moderate protein-calorie malnutrition: Secondary | ICD-10-CM | POA: Diagnosis not present

## 2022-10-20 DIAGNOSIS — D631 Anemia in chronic kidney disease: Secondary | ICD-10-CM | POA: Diagnosis not present

## 2022-10-20 DIAGNOSIS — N390 Urinary tract infection, site not specified: Secondary | ICD-10-CM | POA: Diagnosis not present

## 2022-10-20 DIAGNOSIS — Z992 Dependence on renal dialysis: Secondary | ICD-10-CM | POA: Diagnosis not present

## 2022-10-20 DIAGNOSIS — E538 Deficiency of other specified B group vitamins: Secondary | ICD-10-CM | POA: Diagnosis not present

## 2022-10-20 DIAGNOSIS — G9341 Metabolic encephalopathy: Secondary | ICD-10-CM | POA: Diagnosis not present

## 2022-10-20 DIAGNOSIS — N186 End stage renal disease: Secondary | ICD-10-CM | POA: Diagnosis not present

## 2022-10-20 DIAGNOSIS — E785 Hyperlipidemia, unspecified: Secondary | ICD-10-CM | POA: Diagnosis not present

## 2022-10-20 NOTE — Progress Notes (Signed)
Seen by casting

## 2022-10-22 DIAGNOSIS — E1122 Type 2 diabetes mellitus with diabetic chronic kidney disease: Secondary | ICD-10-CM | POA: Diagnosis not present

## 2022-10-22 DIAGNOSIS — D631 Anemia in chronic kidney disease: Secondary | ICD-10-CM | POA: Diagnosis not present

## 2022-10-22 DIAGNOSIS — N186 End stage renal disease: Secondary | ICD-10-CM | POA: Diagnosis not present

## 2022-10-22 DIAGNOSIS — N2581 Secondary hyperparathyroidism of renal origin: Secondary | ICD-10-CM | POA: Diagnosis not present

## 2022-10-22 DIAGNOSIS — N189 Chronic kidney disease, unspecified: Secondary | ICD-10-CM | POA: Diagnosis not present

## 2022-10-22 DIAGNOSIS — Z992 Dependence on renal dialysis: Secondary | ICD-10-CM | POA: Diagnosis not present

## 2022-10-23 DIAGNOSIS — N186 End stage renal disease: Secondary | ICD-10-CM | POA: Diagnosis not present

## 2022-10-23 DIAGNOSIS — D631 Anemia in chronic kidney disease: Secondary | ICD-10-CM | POA: Diagnosis not present

## 2022-10-23 DIAGNOSIS — N2581 Secondary hyperparathyroidism of renal origin: Secondary | ICD-10-CM | POA: Diagnosis not present

## 2022-10-23 DIAGNOSIS — Z992 Dependence on renal dialysis: Secondary | ICD-10-CM | POA: Diagnosis not present

## 2022-10-23 DIAGNOSIS — E1122 Type 2 diabetes mellitus with diabetic chronic kidney disease: Secondary | ICD-10-CM | POA: Diagnosis not present

## 2022-10-23 DIAGNOSIS — N189 Chronic kidney disease, unspecified: Secondary | ICD-10-CM | POA: Diagnosis not present

## 2022-10-23 DIAGNOSIS — I120 Hypertensive chronic kidney disease with stage 5 chronic kidney disease or end stage renal disease: Secondary | ICD-10-CM | POA: Diagnosis not present

## 2022-10-23 DIAGNOSIS — G9341 Metabolic encephalopathy: Secondary | ICD-10-CM | POA: Diagnosis not present

## 2022-10-25 DIAGNOSIS — Z992 Dependence on renal dialysis: Secondary | ICD-10-CM | POA: Diagnosis not present

## 2022-10-25 DIAGNOSIS — N186 End stage renal disease: Secondary | ICD-10-CM | POA: Diagnosis not present

## 2022-10-25 DIAGNOSIS — E1129 Type 2 diabetes mellitus with other diabetic kidney complication: Secondary | ICD-10-CM | POA: Diagnosis not present

## 2022-10-26 DIAGNOSIS — N189 Chronic kidney disease, unspecified: Secondary | ICD-10-CM | POA: Diagnosis not present

## 2022-10-26 DIAGNOSIS — Z992 Dependence on renal dialysis: Secondary | ICD-10-CM | POA: Diagnosis not present

## 2022-10-26 DIAGNOSIS — D509 Iron deficiency anemia, unspecified: Secondary | ICD-10-CM | POA: Diagnosis not present

## 2022-10-26 DIAGNOSIS — E1122 Type 2 diabetes mellitus with diabetic chronic kidney disease: Secondary | ICD-10-CM | POA: Diagnosis not present

## 2022-10-26 DIAGNOSIS — D631 Anemia in chronic kidney disease: Secondary | ICD-10-CM | POA: Diagnosis not present

## 2022-10-26 DIAGNOSIS — N2581 Secondary hyperparathyroidism of renal origin: Secondary | ICD-10-CM | POA: Diagnosis not present

## 2022-10-26 DIAGNOSIS — N186 End stage renal disease: Secondary | ICD-10-CM | POA: Diagnosis not present

## 2022-10-27 DIAGNOSIS — D631 Anemia in chronic kidney disease: Secondary | ICD-10-CM | POA: Diagnosis not present

## 2022-10-27 DIAGNOSIS — Z992 Dependence on renal dialysis: Secondary | ICD-10-CM | POA: Diagnosis not present

## 2022-10-27 DIAGNOSIS — E1122 Type 2 diabetes mellitus with diabetic chronic kidney disease: Secondary | ICD-10-CM | POA: Diagnosis not present

## 2022-10-27 DIAGNOSIS — I120 Hypertensive chronic kidney disease with stage 5 chronic kidney disease or end stage renal disease: Secondary | ICD-10-CM | POA: Diagnosis not present

## 2022-10-27 DIAGNOSIS — N186 End stage renal disease: Secondary | ICD-10-CM | POA: Diagnosis not present

## 2022-10-27 DIAGNOSIS — G9341 Metabolic encephalopathy: Secondary | ICD-10-CM | POA: Diagnosis not present

## 2022-10-28 DIAGNOSIS — E1122 Type 2 diabetes mellitus with diabetic chronic kidney disease: Secondary | ICD-10-CM | POA: Diagnosis not present

## 2022-10-28 DIAGNOSIS — N186 End stage renal disease: Secondary | ICD-10-CM | POA: Diagnosis not present

## 2022-10-28 DIAGNOSIS — D631 Anemia in chronic kidney disease: Secondary | ICD-10-CM | POA: Diagnosis not present

## 2022-10-28 DIAGNOSIS — N2581 Secondary hyperparathyroidism of renal origin: Secondary | ICD-10-CM | POA: Diagnosis not present

## 2022-10-28 DIAGNOSIS — N189 Chronic kidney disease, unspecified: Secondary | ICD-10-CM | POA: Diagnosis not present

## 2022-10-28 DIAGNOSIS — Z992 Dependence on renal dialysis: Secondary | ICD-10-CM | POA: Diagnosis not present

## 2022-10-29 DIAGNOSIS — D631 Anemia in chronic kidney disease: Secondary | ICD-10-CM | POA: Diagnosis not present

## 2022-10-29 DIAGNOSIS — G9341 Metabolic encephalopathy: Secondary | ICD-10-CM | POA: Diagnosis not present

## 2022-10-29 DIAGNOSIS — I120 Hypertensive chronic kidney disease with stage 5 chronic kidney disease or end stage renal disease: Secondary | ICD-10-CM | POA: Diagnosis not present

## 2022-10-29 DIAGNOSIS — E1122 Type 2 diabetes mellitus with diabetic chronic kidney disease: Secondary | ICD-10-CM | POA: Diagnosis not present

## 2022-10-29 DIAGNOSIS — Z992 Dependence on renal dialysis: Secondary | ICD-10-CM | POA: Diagnosis not present

## 2022-10-29 DIAGNOSIS — N186 End stage renal disease: Secondary | ICD-10-CM | POA: Diagnosis not present

## 2022-10-30 DIAGNOSIS — N186 End stage renal disease: Secondary | ICD-10-CM | POA: Diagnosis not present

## 2022-10-30 DIAGNOSIS — D631 Anemia in chronic kidney disease: Secondary | ICD-10-CM | POA: Diagnosis not present

## 2022-10-30 DIAGNOSIS — N2581 Secondary hyperparathyroidism of renal origin: Secondary | ICD-10-CM | POA: Diagnosis not present

## 2022-10-30 DIAGNOSIS — E1122 Type 2 diabetes mellitus with diabetic chronic kidney disease: Secondary | ICD-10-CM | POA: Diagnosis not present

## 2022-10-30 DIAGNOSIS — Z992 Dependence on renal dialysis: Secondary | ICD-10-CM | POA: Diagnosis not present

## 2022-10-30 DIAGNOSIS — N189 Chronic kidney disease, unspecified: Secondary | ICD-10-CM | POA: Diagnosis not present

## 2022-11-02 DIAGNOSIS — D631 Anemia in chronic kidney disease: Secondary | ICD-10-CM | POA: Diagnosis not present

## 2022-11-02 DIAGNOSIS — N189 Chronic kidney disease, unspecified: Secondary | ICD-10-CM | POA: Diagnosis not present

## 2022-11-02 DIAGNOSIS — N2581 Secondary hyperparathyroidism of renal origin: Secondary | ICD-10-CM | POA: Diagnosis not present

## 2022-11-02 DIAGNOSIS — Z992 Dependence on renal dialysis: Secondary | ICD-10-CM | POA: Diagnosis not present

## 2022-11-02 DIAGNOSIS — N186 End stage renal disease: Secondary | ICD-10-CM | POA: Diagnosis not present

## 2022-11-02 DIAGNOSIS — E1122 Type 2 diabetes mellitus with diabetic chronic kidney disease: Secondary | ICD-10-CM | POA: Diagnosis not present

## 2022-11-04 DIAGNOSIS — N2581 Secondary hyperparathyroidism of renal origin: Secondary | ICD-10-CM | POA: Diagnosis not present

## 2022-11-04 DIAGNOSIS — E1122 Type 2 diabetes mellitus with diabetic chronic kidney disease: Secondary | ICD-10-CM | POA: Diagnosis not present

## 2022-11-04 DIAGNOSIS — D631 Anemia in chronic kidney disease: Secondary | ICD-10-CM | POA: Diagnosis not present

## 2022-11-04 DIAGNOSIS — N189 Chronic kidney disease, unspecified: Secondary | ICD-10-CM | POA: Diagnosis not present

## 2022-11-04 DIAGNOSIS — Z992 Dependence on renal dialysis: Secondary | ICD-10-CM | POA: Diagnosis not present

## 2022-11-04 DIAGNOSIS — N186 End stage renal disease: Secondary | ICD-10-CM | POA: Diagnosis not present

## 2022-11-05 DIAGNOSIS — Z7982 Long term (current) use of aspirin: Secondary | ICD-10-CM | POA: Diagnosis not present

## 2022-11-05 DIAGNOSIS — D631 Anemia in chronic kidney disease: Secondary | ICD-10-CM | POA: Diagnosis not present

## 2022-11-05 DIAGNOSIS — Z992 Dependence on renal dialysis: Secondary | ICD-10-CM | POA: Diagnosis not present

## 2022-11-05 DIAGNOSIS — N390 Urinary tract infection, site not specified: Secondary | ICD-10-CM | POA: Diagnosis not present

## 2022-11-05 DIAGNOSIS — E1142 Type 2 diabetes mellitus with diabetic polyneuropathy: Secondary | ICD-10-CM | POA: Diagnosis not present

## 2022-11-05 DIAGNOSIS — Z604 Social exclusion and rejection: Secondary | ICD-10-CM | POA: Diagnosis not present

## 2022-11-05 DIAGNOSIS — E538 Deficiency of other specified B group vitamins: Secondary | ICD-10-CM | POA: Diagnosis not present

## 2022-11-05 DIAGNOSIS — Z7902 Long term (current) use of antithrombotics/antiplatelets: Secondary | ICD-10-CM | POA: Diagnosis not present

## 2022-11-05 DIAGNOSIS — E1122 Type 2 diabetes mellitus with diabetic chronic kidney disease: Secondary | ICD-10-CM | POA: Diagnosis not present

## 2022-11-05 DIAGNOSIS — M5136 Other intervertebral disc degeneration, lumbar region: Secondary | ICD-10-CM | POA: Diagnosis not present

## 2022-11-05 DIAGNOSIS — E785 Hyperlipidemia, unspecified: Secondary | ICD-10-CM | POA: Diagnosis not present

## 2022-11-05 DIAGNOSIS — I120 Hypertensive chronic kidney disease with stage 5 chronic kidney disease or end stage renal disease: Secondary | ICD-10-CM | POA: Diagnosis not present

## 2022-11-05 DIAGNOSIS — Z8673 Personal history of transient ischemic attack (TIA), and cerebral infarction without residual deficits: Secondary | ICD-10-CM | POA: Diagnosis not present

## 2022-11-05 DIAGNOSIS — E440 Moderate protein-calorie malnutrition: Secondary | ICD-10-CM | POA: Diagnosis not present

## 2022-11-05 DIAGNOSIS — E11319 Type 2 diabetes mellitus with unspecified diabetic retinopathy without macular edema: Secondary | ICD-10-CM | POA: Diagnosis not present

## 2022-11-05 DIAGNOSIS — I340 Nonrheumatic mitral (valve) insufficiency: Secondary | ICD-10-CM | POA: Diagnosis not present

## 2022-11-05 DIAGNOSIS — G9341 Metabolic encephalopathy: Secondary | ICD-10-CM | POA: Diagnosis not present

## 2022-11-05 DIAGNOSIS — N186 End stage renal disease: Secondary | ICD-10-CM | POA: Diagnosis not present

## 2022-11-05 DIAGNOSIS — I672 Cerebral atherosclerosis: Secondary | ICD-10-CM | POA: Diagnosis not present

## 2022-11-05 DIAGNOSIS — F0153 Vascular dementia, unspecified severity, with mood disturbance: Secondary | ICD-10-CM | POA: Diagnosis not present

## 2022-11-05 DIAGNOSIS — F32A Depression, unspecified: Secondary | ICD-10-CM | POA: Diagnosis not present

## 2022-11-06 DIAGNOSIS — N186 End stage renal disease: Secondary | ICD-10-CM | POA: Diagnosis not present

## 2022-11-06 DIAGNOSIS — D631 Anemia in chronic kidney disease: Secondary | ICD-10-CM | POA: Diagnosis not present

## 2022-11-06 DIAGNOSIS — N2581 Secondary hyperparathyroidism of renal origin: Secondary | ICD-10-CM | POA: Diagnosis not present

## 2022-11-06 DIAGNOSIS — Z992 Dependence on renal dialysis: Secondary | ICD-10-CM | POA: Diagnosis not present

## 2022-11-06 DIAGNOSIS — N189 Chronic kidney disease, unspecified: Secondary | ICD-10-CM | POA: Diagnosis not present

## 2022-11-06 DIAGNOSIS — E1122 Type 2 diabetes mellitus with diabetic chronic kidney disease: Secondary | ICD-10-CM | POA: Diagnosis not present

## 2022-11-07 DIAGNOSIS — D631 Anemia in chronic kidney disease: Secondary | ICD-10-CM | POA: Diagnosis not present

## 2022-11-07 DIAGNOSIS — Z992 Dependence on renal dialysis: Secondary | ICD-10-CM | POA: Diagnosis not present

## 2022-11-07 DIAGNOSIS — N186 End stage renal disease: Secondary | ICD-10-CM | POA: Diagnosis not present

## 2022-11-07 DIAGNOSIS — E1122 Type 2 diabetes mellitus with diabetic chronic kidney disease: Secondary | ICD-10-CM | POA: Diagnosis not present

## 2022-11-07 DIAGNOSIS — G9341 Metabolic encephalopathy: Secondary | ICD-10-CM | POA: Diagnosis not present

## 2022-11-07 DIAGNOSIS — I120 Hypertensive chronic kidney disease with stage 5 chronic kidney disease or end stage renal disease: Secondary | ICD-10-CM | POA: Diagnosis not present

## 2022-11-09 DIAGNOSIS — N189 Chronic kidney disease, unspecified: Secondary | ICD-10-CM | POA: Diagnosis not present

## 2022-11-09 DIAGNOSIS — E1122 Type 2 diabetes mellitus with diabetic chronic kidney disease: Secondary | ICD-10-CM | POA: Diagnosis not present

## 2022-11-09 DIAGNOSIS — N2581 Secondary hyperparathyroidism of renal origin: Secondary | ICD-10-CM | POA: Diagnosis not present

## 2022-11-09 DIAGNOSIS — Z992 Dependence on renal dialysis: Secondary | ICD-10-CM | POA: Diagnosis not present

## 2022-11-09 DIAGNOSIS — N186 End stage renal disease: Secondary | ICD-10-CM | POA: Diagnosis not present

## 2022-11-09 DIAGNOSIS — D631 Anemia in chronic kidney disease: Secondary | ICD-10-CM | POA: Diagnosis not present

## 2022-11-10 DIAGNOSIS — I120 Hypertensive chronic kidney disease with stage 5 chronic kidney disease or end stage renal disease: Secondary | ICD-10-CM | POA: Diagnosis not present

## 2022-11-10 DIAGNOSIS — D631 Anemia in chronic kidney disease: Secondary | ICD-10-CM | POA: Diagnosis not present

## 2022-11-10 DIAGNOSIS — N186 End stage renal disease: Secondary | ICD-10-CM | POA: Diagnosis not present

## 2022-11-10 DIAGNOSIS — G9341 Metabolic encephalopathy: Secondary | ICD-10-CM | POA: Diagnosis not present

## 2022-11-10 DIAGNOSIS — Z992 Dependence on renal dialysis: Secondary | ICD-10-CM | POA: Diagnosis not present

## 2022-11-10 DIAGNOSIS — E1122 Type 2 diabetes mellitus with diabetic chronic kidney disease: Secondary | ICD-10-CM | POA: Diagnosis not present

## 2022-11-11 DIAGNOSIS — D631 Anemia in chronic kidney disease: Secondary | ICD-10-CM | POA: Diagnosis not present

## 2022-11-11 DIAGNOSIS — E1122 Type 2 diabetes mellitus with diabetic chronic kidney disease: Secondary | ICD-10-CM | POA: Diagnosis not present

## 2022-11-11 DIAGNOSIS — N189 Chronic kidney disease, unspecified: Secondary | ICD-10-CM | POA: Diagnosis not present

## 2022-11-11 DIAGNOSIS — Z992 Dependence on renal dialysis: Secondary | ICD-10-CM | POA: Diagnosis not present

## 2022-11-11 DIAGNOSIS — N186 End stage renal disease: Secondary | ICD-10-CM | POA: Diagnosis not present

## 2022-11-11 DIAGNOSIS — N2581 Secondary hyperparathyroidism of renal origin: Secondary | ICD-10-CM | POA: Diagnosis not present

## 2022-11-13 DIAGNOSIS — N2581 Secondary hyperparathyroidism of renal origin: Secondary | ICD-10-CM | POA: Diagnosis not present

## 2022-11-13 DIAGNOSIS — N186 End stage renal disease: Secondary | ICD-10-CM | POA: Diagnosis not present

## 2022-11-13 DIAGNOSIS — E1122 Type 2 diabetes mellitus with diabetic chronic kidney disease: Secondary | ICD-10-CM | POA: Diagnosis not present

## 2022-11-13 DIAGNOSIS — D631 Anemia in chronic kidney disease: Secondary | ICD-10-CM | POA: Diagnosis not present

## 2022-11-13 DIAGNOSIS — Z992 Dependence on renal dialysis: Secondary | ICD-10-CM | POA: Diagnosis not present

## 2022-11-13 DIAGNOSIS — N189 Chronic kidney disease, unspecified: Secondary | ICD-10-CM | POA: Diagnosis not present

## 2022-11-16 DIAGNOSIS — E1122 Type 2 diabetes mellitus with diabetic chronic kidney disease: Secondary | ICD-10-CM | POA: Diagnosis not present

## 2022-11-16 DIAGNOSIS — N189 Chronic kidney disease, unspecified: Secondary | ICD-10-CM | POA: Diagnosis not present

## 2022-11-16 DIAGNOSIS — D631 Anemia in chronic kidney disease: Secondary | ICD-10-CM | POA: Diagnosis not present

## 2022-11-16 DIAGNOSIS — N2581 Secondary hyperparathyroidism of renal origin: Secondary | ICD-10-CM | POA: Diagnosis not present

## 2022-11-16 DIAGNOSIS — N186 End stage renal disease: Secondary | ICD-10-CM | POA: Diagnosis not present

## 2022-11-16 DIAGNOSIS — Z992 Dependence on renal dialysis: Secondary | ICD-10-CM | POA: Diagnosis not present

## 2022-11-17 DIAGNOSIS — E1122 Type 2 diabetes mellitus with diabetic chronic kidney disease: Secondary | ICD-10-CM | POA: Diagnosis not present

## 2022-11-17 DIAGNOSIS — G9341 Metabolic encephalopathy: Secondary | ICD-10-CM | POA: Diagnosis not present

## 2022-11-17 DIAGNOSIS — N186 End stage renal disease: Secondary | ICD-10-CM | POA: Diagnosis not present

## 2022-11-17 DIAGNOSIS — I120 Hypertensive chronic kidney disease with stage 5 chronic kidney disease or end stage renal disease: Secondary | ICD-10-CM | POA: Diagnosis not present

## 2022-11-17 DIAGNOSIS — D631 Anemia in chronic kidney disease: Secondary | ICD-10-CM | POA: Diagnosis not present

## 2022-11-17 DIAGNOSIS — Z992 Dependence on renal dialysis: Secondary | ICD-10-CM | POA: Diagnosis not present

## 2022-11-18 ENCOUNTER — Other Ambulatory Visit: Payer: Self-pay | Admitting: Cardiology

## 2022-11-18 DIAGNOSIS — Z1339 Encounter for screening examination for other mental health and behavioral disorders: Secondary | ICD-10-CM | POA: Diagnosis not present

## 2022-11-18 DIAGNOSIS — N189 Chronic kidney disease, unspecified: Secondary | ICD-10-CM | POA: Diagnosis not present

## 2022-11-18 DIAGNOSIS — Z0000 Encounter for general adult medical examination without abnormal findings: Secondary | ICD-10-CM | POA: Diagnosis not present

## 2022-11-18 DIAGNOSIS — Z992 Dependence on renal dialysis: Secondary | ICD-10-CM | POA: Diagnosis not present

## 2022-11-18 DIAGNOSIS — I129 Hypertensive chronic kidney disease with stage 1 through stage 4 chronic kidney disease, or unspecified chronic kidney disease: Secondary | ICD-10-CM | POA: Diagnosis not present

## 2022-11-18 DIAGNOSIS — E1122 Type 2 diabetes mellitus with diabetic chronic kidney disease: Secondary | ICD-10-CM | POA: Diagnosis not present

## 2022-11-18 DIAGNOSIS — N186 End stage renal disease: Secondary | ICD-10-CM | POA: Diagnosis not present

## 2022-11-18 DIAGNOSIS — E11319 Type 2 diabetes mellitus with unspecified diabetic retinopathy without macular edema: Secondary | ICD-10-CM | POA: Diagnosis not present

## 2022-11-18 DIAGNOSIS — N2581 Secondary hyperparathyroidism of renal origin: Secondary | ICD-10-CM | POA: Diagnosis not present

## 2022-11-18 DIAGNOSIS — N184 Chronic kidney disease, stage 4 (severe): Secondary | ICD-10-CM | POA: Diagnosis not present

## 2022-11-18 DIAGNOSIS — E88A Wasting disease (syndrome) due to underlying condition: Secondary | ICD-10-CM | POA: Diagnosis not present

## 2022-11-18 DIAGNOSIS — E1140 Type 2 diabetes mellitus with diabetic neuropathy, unspecified: Secondary | ICD-10-CM | POA: Diagnosis not present

## 2022-11-18 DIAGNOSIS — Z1331 Encounter for screening for depression: Secondary | ICD-10-CM | POA: Diagnosis not present

## 2022-11-18 DIAGNOSIS — D631 Anemia in chronic kidney disease: Secondary | ICD-10-CM | POA: Diagnosis not present

## 2022-11-18 DIAGNOSIS — I10 Essential (primary) hypertension: Secondary | ICD-10-CM | POA: Diagnosis not present

## 2022-11-23 DIAGNOSIS — N186 End stage renal disease: Secondary | ICD-10-CM | POA: Diagnosis not present

## 2022-11-23 DIAGNOSIS — D631 Anemia in chronic kidney disease: Secondary | ICD-10-CM | POA: Diagnosis not present

## 2022-11-23 DIAGNOSIS — Z992 Dependence on renal dialysis: Secondary | ICD-10-CM | POA: Diagnosis not present

## 2022-11-23 DIAGNOSIS — N189 Chronic kidney disease, unspecified: Secondary | ICD-10-CM | POA: Diagnosis not present

## 2022-11-23 DIAGNOSIS — E1122 Type 2 diabetes mellitus with diabetic chronic kidney disease: Secondary | ICD-10-CM | POA: Diagnosis not present

## 2022-11-23 DIAGNOSIS — N2581 Secondary hyperparathyroidism of renal origin: Secondary | ICD-10-CM | POA: Diagnosis not present

## 2022-11-24 ENCOUNTER — Other Ambulatory Visit: Payer: Self-pay | Admitting: *Deleted

## 2022-11-24 DIAGNOSIS — Z992 Dependence on renal dialysis: Secondary | ICD-10-CM | POA: Diagnosis not present

## 2022-11-24 DIAGNOSIS — I120 Hypertensive chronic kidney disease with stage 5 chronic kidney disease or end stage renal disease: Secondary | ICD-10-CM | POA: Diagnosis not present

## 2022-11-24 DIAGNOSIS — D649 Anemia, unspecified: Secondary | ICD-10-CM

## 2022-11-24 DIAGNOSIS — D631 Anemia in chronic kidney disease: Secondary | ICD-10-CM | POA: Diagnosis not present

## 2022-11-24 DIAGNOSIS — E1129 Type 2 diabetes mellitus with other diabetic kidney complication: Secondary | ICD-10-CM | POA: Diagnosis not present

## 2022-11-24 DIAGNOSIS — N186 End stage renal disease: Secondary | ICD-10-CM | POA: Diagnosis not present

## 2022-11-24 DIAGNOSIS — G9341 Metabolic encephalopathy: Secondary | ICD-10-CM | POA: Diagnosis not present

## 2022-11-24 DIAGNOSIS — E1122 Type 2 diabetes mellitus with diabetic chronic kidney disease: Secondary | ICD-10-CM | POA: Diagnosis not present

## 2022-11-25 DIAGNOSIS — D631 Anemia in chronic kidney disease: Secondary | ICD-10-CM | POA: Diagnosis not present

## 2022-11-25 DIAGNOSIS — D509 Iron deficiency anemia, unspecified: Secondary | ICD-10-CM | POA: Diagnosis not present

## 2022-11-25 DIAGNOSIS — N2581 Secondary hyperparathyroidism of renal origin: Secondary | ICD-10-CM | POA: Diagnosis not present

## 2022-11-25 DIAGNOSIS — N186 End stage renal disease: Secondary | ICD-10-CM | POA: Diagnosis not present

## 2022-11-25 DIAGNOSIS — N189 Chronic kidney disease, unspecified: Secondary | ICD-10-CM | POA: Diagnosis not present

## 2022-11-25 DIAGNOSIS — Z992 Dependence on renal dialysis: Secondary | ICD-10-CM | POA: Diagnosis not present

## 2022-11-25 DIAGNOSIS — E1122 Type 2 diabetes mellitus with diabetic chronic kidney disease: Secondary | ICD-10-CM | POA: Diagnosis not present

## 2022-11-26 ENCOUNTER — Inpatient Hospital Stay: Payer: Medicare Other | Attending: Hematology and Oncology

## 2022-11-26 ENCOUNTER — Inpatient Hospital Stay: Payer: Medicare Other | Admitting: Hematology and Oncology

## 2022-11-26 VITALS — BP 153/33 | HR 65 | Temp 97.9°F | Resp 18 | Ht 64.0 in | Wt 105.8 lb

## 2022-11-26 DIAGNOSIS — Z992 Dependence on renal dialysis: Secondary | ICD-10-CM | POA: Insufficient documentation

## 2022-11-26 DIAGNOSIS — N186 End stage renal disease: Secondary | ICD-10-CM | POA: Insufficient documentation

## 2022-11-26 DIAGNOSIS — D649 Anemia, unspecified: Secondary | ICD-10-CM | POA: Insufficient documentation

## 2022-11-26 LAB — CBC WITH DIFFERENTIAL (CANCER CENTER ONLY)
Abs Immature Granulocytes: 0.01 10*3/uL (ref 0.00–0.07)
Basophils Absolute: 0 10*3/uL (ref 0.0–0.1)
Basophils Relative: 1 %
Eosinophils Absolute: 0.2 10*3/uL (ref 0.0–0.5)
Eosinophils Relative: 3 %
HCT: 33.8 % — ABNORMAL LOW (ref 36.0–46.0)
Hemoglobin: 11 g/dL — ABNORMAL LOW (ref 12.0–15.0)
Immature Granulocytes: 0 %
Lymphocytes Relative: 32 %
Lymphs Abs: 1.8 10*3/uL (ref 0.7–4.0)
MCH: 32.2 pg (ref 26.0–34.0)
MCHC: 32.5 g/dL (ref 30.0–36.0)
MCV: 98.8 fL (ref 80.0–100.0)
Monocytes Absolute: 0.5 10*3/uL (ref 0.1–1.0)
Monocytes Relative: 8 %
Neutro Abs: 3.2 10*3/uL (ref 1.7–7.7)
Neutrophils Relative %: 56 %
Platelet Count: 81 10*3/uL — ABNORMAL LOW (ref 150–400)
RBC: 3.42 MIL/uL — ABNORMAL LOW (ref 3.87–5.11)
RDW: 13.8 % (ref 11.5–15.5)
WBC Count: 5.6 10*3/uL (ref 4.0–10.5)
nRBC: 0 % (ref 0.0–0.2)

## 2022-11-26 LAB — SAMPLE TO BLOOD BANK

## 2022-11-26 NOTE — Assessment & Plan Note (Signed)
04/25/2018: Hemoglobin 9.8, MCV 89, platelets 129 10/18/2019: Hemoglobin 11.3, platelets 127 10/01/2022: Hemoglobin 10.1, platelets 96   Bone marrow biopsy: 01/20/2019: Hypercellular bone marrow 50 to 60% for age with trilineage hematopoiesis, several small lymphoid aggregates present.  Mild dyspoietic changes involving the red cells.  No increase in blasts.  Confirmed that these changes are related to chronic kidney disease.  Flow cytometry: Negative, platelet clumping noted suggestive of pseudothrombocytopenia.   Current treatment:  Patient will follow with nephrology for her anemia needs going further.  She gets iron infusions and Procrit injections through them.   Hospitalization 04/26/2021-04/30/2021 pyelonephritis Hospitalization 09/30/2022-10/02/2022: Unresponsive at home, confusion (sequela of prior stroke)

## 2022-11-26 NOTE — Progress Notes (Signed)
Patient Care Team: Lewis Moccasin, MD as PCP - General (Family Medicine) Karie Soda, MD as Consulting Physician (General Surgery) Eugenia Mcalpine, MD (Inactive) as Consulting Physician (Orthopedic Surgery) Dagoberto Ligas, MD as Consulting Physician (Nephrology) Serena Croissant, MD as Consulting Physician (Hematology and Oncology) Center, [facility]  DIAGNOSIS:  Encounter Diagnosis  Name Primary?   Normocytic anemia Yes   CHIEF COMPLIANT: Follow-up of anemia and thrombocytopenia History of Present Illness   The patient, with a history of chronic kidney disease and anemia, presents for follow-up after recent hospitalization and initiation of dialysis. She reports feeling fine since starting dialysis. She had a history of anemia, which was managed with injections to boost blood production three years ago. She has not received any injections since then. She also has a history of low platelet count, which has been monitored. She has not experienced any bleeding or other symptoms related to low platelets. She is currently on dialysis three days a week.     ALLERGIES:  has No Known Allergies.  MEDICATIONS:  Current Outpatient Medications  Medication Sig Dispense Refill   amLODipine (NORVASC) 5 MG tablet Take 1 tablet (5 mg total) by mouth at bedtime. 30 tablet 0   aspirin EC 81 MG tablet Take 1 tablet (81 mg total) by mouth daily. Swallow whole. 120 tablet 0   atorvastatin (LIPITOR) 40 MG tablet TAKE 1 TABLET(40 MG) BY MOUTH DAILY 90 tablet 0   AURYXIA 1 GM 210 MG(Fe) tablet Take 210 mg by mouth 3 (three) times daily.     donepezil (ARICEPT) 5 MG tablet Take 1/2 pill daily for 4 weeks, then increase to 1 pill daily 90 tablet 4   multivitamin (RENA-VIT) TABS tablet Take 1 tablet by mouth daily.     olmesartan (BENICAR) 40 MG tablet Take 1/2 tablet (20 mg total) by mouth daily. 15 tablet 0   No current facility-administered medications for this visit.    PHYSICAL  EXAMINATION: ECOG PERFORMANCE STATUS: 1 - Symptomatic but completely ambulatory  Vitals:   11/26/22 1051  BP: (!) 153/33  Pulse: 65  Resp: 18  Temp: 97.9 F (36.6 C)  SpO2: 100%   Filed Weights   11/26/22 1051  Weight: 105 lb 12.8 oz (48 kg)    LABORATORY DATA:  I have reviewed the data as listed    Latest Ref Rng & Units 10/01/2022   12:35 AM 09/30/2022   10:25 AM 09/30/2022   10:16 AM  CMP  Glucose 70 - 99 mg/dL 213  97  94   BUN 8 - 23 mg/dL 61  47  47   Creatinine 0.44 - 1.00 mg/dL 0.86  5.78  4.69   Sodium 135 - 145 mmol/L 138  139  137   Potassium 3.5 - 5.1 mmol/L 4.0  4.4  4.4   Chloride 98 - 111 mmol/L 94  100  94   CO2 22 - 32 mmol/L 29   25   Calcium 8.9 - 10.3 mg/dL 8.5   9.1   Total Protein 6.5 - 8.1 g/dL   6.9   Total Bilirubin 0.3 - 1.2 mg/dL   0.5   Alkaline Phos 38 - 126 U/L   104   AST 15 - 41 U/L   20   ALT 0 - 44 U/L   13     Lab Results  Component Value Date   WBC 5.6 11/26/2022   HGB 11.0 (L) 11/26/2022   HCT 33.8 (L) 11/26/2022  MCV 98.8 11/26/2022   PLT 81 (L) 11/26/2022   NEUTROABS 3.2 11/26/2022    ASSESSMENT & PLAN:  Normocytic anemia 04/25/2018: Hemoglobin 9.8, MCV 89, platelets 129 10/18/2019: Hemoglobin 11.3, platelets 127 10/01/2022: Hemoglobin 10.1, platelets 96   Bone marrow biopsy: 01/20/2019: Hypercellular bone marrow 50 to 60% for age with trilineage hematopoiesis, several small lymphoid aggregates present.  Mild dyspoietic changes involving the red cells.  No increase in blasts.  Confirmed that these changes are related to chronic kidney disease.  Flow cytometry: Negative, platelet clumping noted suggestive of pseudothrombocytopenia.   Current treatment:  Patient will follow with nephrology for her anemia needs going further.  She gets iron infusions and Procrit injections through them.   Hospitalization 04/26/2021-04/30/2021 pyelonephritis Hospitalization 09/30/2022-10/02/2022: Unresponsive at home, confusion (sequela of prior  stroke)     Thrombocytopenia Platelet count of 81 today, previously it was 96, up from 70s four months ago. No active bleeding. No medications known to lower platelets. Previous bone marrow biopsy was normal. Possible contribution from dialysis and heparin use. -If platelet count drops below 50, consider repeating bone marrow biopsy.  Chronic Kidney Disease On dialysis three days a week. -Continue current management.  Anemia No recent injections to boost blood production. Dialysis team also monitoring. -Continue current management.   Follow-up with me if the platelets drop below 50.  No orders of the defined types were placed in this encounter.  The patient has a good understanding of the overall plan. she agrees with it. she will call with any problems that may develop before the next visit here. Total time spent: 30 mins including face to face time and time spent for planning, charting and co-ordination of care   Tamsen Meek, MD 11/26/22

## 2022-11-27 ENCOUNTER — Other Ambulatory Visit (HOSPITAL_COMMUNITY): Payer: Self-pay

## 2022-11-27 DIAGNOSIS — Z992 Dependence on renal dialysis: Secondary | ICD-10-CM | POA: Diagnosis not present

## 2022-11-27 DIAGNOSIS — D509 Iron deficiency anemia, unspecified: Secondary | ICD-10-CM | POA: Diagnosis not present

## 2022-11-27 DIAGNOSIS — N2581 Secondary hyperparathyroidism of renal origin: Secondary | ICD-10-CM | POA: Diagnosis not present

## 2022-11-27 DIAGNOSIS — D631 Anemia in chronic kidney disease: Secondary | ICD-10-CM | POA: Diagnosis not present

## 2022-11-27 DIAGNOSIS — N189 Chronic kidney disease, unspecified: Secondary | ICD-10-CM | POA: Diagnosis not present

## 2022-11-27 DIAGNOSIS — N186 End stage renal disease: Secondary | ICD-10-CM | POA: Diagnosis not present

## 2022-11-30 DIAGNOSIS — Z992 Dependence on renal dialysis: Secondary | ICD-10-CM | POA: Diagnosis not present

## 2022-11-30 DIAGNOSIS — D509 Iron deficiency anemia, unspecified: Secondary | ICD-10-CM | POA: Diagnosis not present

## 2022-11-30 DIAGNOSIS — N186 End stage renal disease: Secondary | ICD-10-CM | POA: Diagnosis not present

## 2022-11-30 DIAGNOSIS — D631 Anemia in chronic kidney disease: Secondary | ICD-10-CM | POA: Diagnosis not present

## 2022-11-30 DIAGNOSIS — N2581 Secondary hyperparathyroidism of renal origin: Secondary | ICD-10-CM | POA: Diagnosis not present

## 2022-11-30 DIAGNOSIS — N189 Chronic kidney disease, unspecified: Secondary | ICD-10-CM | POA: Diagnosis not present

## 2022-12-01 DIAGNOSIS — G9341 Metabolic encephalopathy: Secondary | ICD-10-CM | POA: Diagnosis not present

## 2022-12-01 DIAGNOSIS — E1122 Type 2 diabetes mellitus with diabetic chronic kidney disease: Secondary | ICD-10-CM | POA: Diagnosis not present

## 2022-12-01 DIAGNOSIS — I120 Hypertensive chronic kidney disease with stage 5 chronic kidney disease or end stage renal disease: Secondary | ICD-10-CM | POA: Diagnosis not present

## 2022-12-01 DIAGNOSIS — N186 End stage renal disease: Secondary | ICD-10-CM | POA: Diagnosis not present

## 2022-12-01 DIAGNOSIS — Z992 Dependence on renal dialysis: Secondary | ICD-10-CM | POA: Diagnosis not present

## 2022-12-01 DIAGNOSIS — D631 Anemia in chronic kidney disease: Secondary | ICD-10-CM | POA: Diagnosis not present

## 2022-12-03 DIAGNOSIS — F0390 Unspecified dementia without behavioral disturbance: Secondary | ICD-10-CM | POA: Diagnosis not present

## 2022-12-03 DIAGNOSIS — L84 Corns and callosities: Secondary | ICD-10-CM | POA: Diagnosis not present

## 2022-12-03 DIAGNOSIS — E118 Type 2 diabetes mellitus with unspecified complications: Secondary | ICD-10-CM | POA: Diagnosis not present

## 2022-12-03 DIAGNOSIS — I10 Essential (primary) hypertension: Secondary | ICD-10-CM | POA: Diagnosis not present

## 2022-12-03 DIAGNOSIS — F411 Generalized anxiety disorder: Secondary | ICD-10-CM | POA: Diagnosis not present

## 2022-12-04 DIAGNOSIS — Z992 Dependence on renal dialysis: Secondary | ICD-10-CM | POA: Diagnosis not present

## 2022-12-04 DIAGNOSIS — N189 Chronic kidney disease, unspecified: Secondary | ICD-10-CM | POA: Diagnosis not present

## 2022-12-04 DIAGNOSIS — D631 Anemia in chronic kidney disease: Secondary | ICD-10-CM | POA: Diagnosis not present

## 2022-12-04 DIAGNOSIS — D509 Iron deficiency anemia, unspecified: Secondary | ICD-10-CM | POA: Diagnosis not present

## 2022-12-04 DIAGNOSIS — N2581 Secondary hyperparathyroidism of renal origin: Secondary | ICD-10-CM | POA: Diagnosis not present

## 2022-12-04 DIAGNOSIS — N186 End stage renal disease: Secondary | ICD-10-CM | POA: Diagnosis not present

## 2022-12-07 DIAGNOSIS — Z992 Dependence on renal dialysis: Secondary | ICD-10-CM | POA: Diagnosis not present

## 2022-12-07 DIAGNOSIS — D631 Anemia in chronic kidney disease: Secondary | ICD-10-CM | POA: Diagnosis not present

## 2022-12-07 DIAGNOSIS — D509 Iron deficiency anemia, unspecified: Secondary | ICD-10-CM | POA: Diagnosis not present

## 2022-12-07 DIAGNOSIS — N189 Chronic kidney disease, unspecified: Secondary | ICD-10-CM | POA: Diagnosis not present

## 2022-12-07 DIAGNOSIS — N186 End stage renal disease: Secondary | ICD-10-CM | POA: Diagnosis not present

## 2022-12-07 DIAGNOSIS — N2581 Secondary hyperparathyroidism of renal origin: Secondary | ICD-10-CM | POA: Diagnosis not present

## 2022-12-08 DIAGNOSIS — F0390 Unspecified dementia without behavioral disturbance: Secondary | ICD-10-CM | POA: Diagnosis not present

## 2022-12-08 DIAGNOSIS — I10 Essential (primary) hypertension: Secondary | ICD-10-CM | POA: Diagnosis not present

## 2022-12-08 DIAGNOSIS — T50905A Adverse effect of unspecified drugs, medicaments and biological substances, initial encounter: Secondary | ICD-10-CM | POA: Diagnosis not present

## 2022-12-09 DIAGNOSIS — Z992 Dependence on renal dialysis: Secondary | ICD-10-CM | POA: Diagnosis not present

## 2022-12-09 DIAGNOSIS — D631 Anemia in chronic kidney disease: Secondary | ICD-10-CM | POA: Diagnosis not present

## 2022-12-09 DIAGNOSIS — D509 Iron deficiency anemia, unspecified: Secondary | ICD-10-CM | POA: Diagnosis not present

## 2022-12-09 DIAGNOSIS — N2581 Secondary hyperparathyroidism of renal origin: Secondary | ICD-10-CM | POA: Diagnosis not present

## 2022-12-09 DIAGNOSIS — N189 Chronic kidney disease, unspecified: Secondary | ICD-10-CM | POA: Diagnosis not present

## 2022-12-09 DIAGNOSIS — N186 End stage renal disease: Secondary | ICD-10-CM | POA: Diagnosis not present

## 2022-12-14 ENCOUNTER — Ambulatory Visit (INDEPENDENT_AMBULATORY_CARE_PROVIDER_SITE_OTHER): Payer: Medicare Other | Admitting: Orthopedic Surgery

## 2022-12-14 ENCOUNTER — Encounter: Payer: Self-pay | Admitting: Orthopedic Surgery

## 2022-12-14 DIAGNOSIS — D509 Iron deficiency anemia, unspecified: Secondary | ICD-10-CM | POA: Diagnosis not present

## 2022-12-14 DIAGNOSIS — D631 Anemia in chronic kidney disease: Secondary | ICD-10-CM | POA: Diagnosis not present

## 2022-12-14 DIAGNOSIS — N2581 Secondary hyperparathyroidism of renal origin: Secondary | ICD-10-CM | POA: Diagnosis not present

## 2022-12-14 DIAGNOSIS — Z992 Dependence on renal dialysis: Secondary | ICD-10-CM | POA: Diagnosis not present

## 2022-12-14 DIAGNOSIS — N189 Chronic kidney disease, unspecified: Secondary | ICD-10-CM | POA: Diagnosis not present

## 2022-12-14 DIAGNOSIS — N186 End stage renal disease: Secondary | ICD-10-CM | POA: Diagnosis not present

## 2022-12-14 DIAGNOSIS — L97511 Non-pressure chronic ulcer of other part of right foot limited to breakdown of skin: Secondary | ICD-10-CM

## 2022-12-14 DIAGNOSIS — I739 Peripheral vascular disease, unspecified: Secondary | ICD-10-CM

## 2022-12-14 NOTE — Progress Notes (Signed)
Office Visit Note   Patient: Bailey [name]           Date of Birth: Dec 15, 1950           MRN: [id number] Visit Date: 12/14/2022              Requested by: Lewis Moccasin, MD [address] [place],  [place] [address] PCP: Lewis Moccasin, MD  Chief Complaint  Patient presents with   Right Foot - Wound Check      HPI: Patient is a 72 year old woman who is seen for initial evaluation for right foot plantar ulcer with Charcot collapse.  Patient also has interdigital ulcers with clawing of her toes.  Patient has a history of hypertension diabetes end-stage renal disease on dialysis Tuesday Thursday Saturday.  No history of smoking.  Assessment & Plan: Visit Diagnoses:  1. PAD (peripheral artery disease) (HCC)   2. Non-pressure chronic ulcer of other part of right foot limited to breakdown of skin (HCC)     Plan: Ulcer was debrided.  Will send her to vascular surgery for ankle-brachial indices.  Follow-Up Instructions: Return in about 4 weeks (around 01/11/2023).   Ortho Exam  Patient is alert, oriented, no adenopathy, well-dressed, normal affect, normal respiratory effort. Examination patient does not have a palpable dorsalis pedis or posterior tibial pulse.  The Doppler was used and she has dampened biphasic dorsalis pedis pulse.  She has a Charcot collapse through the right midfoot status post great toe amputation that is healed well.  After informed consent a 10 blade knife was used to debride the skin and soft tissue back to healthy viable bleeding granulation tissue this was touched with silver nitrate ulcer is 2 cm in diameter after debridement.  There is fixed clawing of the lesser toes with the toes pressing on each other with small interdigital ulcers.  Pads were placed in the webspaces to unload these ulcers.  Imaging: No results found. No images are attached to the encounter.  Labs: Lab Results  Component Value Date   HGBA1C 4.8 07/11/2022   HGBA1C 5.5  08/20/2019   HGBA1C 10.2 (H) 05/06/2016   ESRSEDRATE 108 (H) 04/13/2010   REPTSTATUS 10/03/2022 FINAL 09/30/2022   GRAMSTAIN  02/11/2014    ABUNDANT WBC PRESENT,BOTH PMN AND MONONUCLEAR NO SQUAMOUS EPITHELIAL CELLS SEEN FEW GRAM POSITIVE COCCI IN PAIRS IN CLUSTERS Performed at [facility]    GRAMSTAIN  02/11/2014    ABUNDANT WBC PRESENT,BOTH PMN AND MONONUCLEAR NO SQUAMOUS EPITHELIAL CELLS SEEN FEW GRAM POSITIVE COCCI IN PAIRS IN CLUSTERS Performed at [facility]    CULT MULTIPLE SPECIES PRESENT, SUGGEST RECOLLECTION (A) 09/30/2022   LABORGA STAPHYLOCOCCUS AUREUS 09/02/2019     Lab Results  Component Value Date   ALBUMIN 3.5 09/30/2022   ALBUMIN 3.1 (L) 07/14/2022   ALBUMIN 3.1 (L) 07/13/2022    Lab Results  Component Value Date   MG 2.2 07/11/2022   MG 1.7 09/02/2019   MG 1.8 08/20/2019   No results found for: "VD25OH"  No results found for: "PREALBUMIN"    Latest Ref Rng & Units 11/26/2022   10:24 AM 10/01/2022   12:35 AM 09/30/2022   10:25 AM  CBC EXTENDED  WBC 4.0 - 10.5 K/uL 5.6  6.4    RBC 3.87 - 5.11 MIL/uL 3.42  3.12    Hemoglobin 12.0 - 15.0 g/dL 19.1  47.8  29.5   HCT 36.0 - 46.0 % 33.8  31.4  36.0   Platelets  150 - 400 K/uL 81  96    NEUT# 1.7 - 7.7 K/uL 3.2     Lymph# 0.7 - 4.0 K/uL 1.8        There is no height or weight on file to calculate BMI.  Orders:  Orders Placed This Encounter  Procedures   VAS [place] ABI WITH/WO TBI   No orders of the defined types were placed in this encounter.    Procedures: No procedures performed  Clinical Data: No additional findings.  ROS:  All other systems negative, except as noted in the HPI. Review of Systems  Objective: Vital Signs: LMP  (LMP Unknown)   Specialty Comments:  No specialty comments available.  PMFS History: Patient Active Problem List   Diagnosis Date Noted   UTI (urinary tract infection) 10/01/2022   Acute metabolic encephalopathy 09/30/2022   HLD  (hyperlipidemia) 09/30/2022   History of CVA (cerebrovascular accident) 09/30/2022   Essential hypertension 07/13/2022   Acute CVA (cerebrovascular accident) (HCC) 07/11/2022   Pyelonephritis 04/27/2021   Malnutrition of moderate degree 09/05/2019   Hypokalemia 09/02/2019   Fever of unknown origin 09/02/2019   ESRD on dialysis (HCC) 08/20/2019   Symptomatic anemia 08/20/2019   Thrombocytopenia (HCC) 08/20/2019   CKD (chronic kidney disease) 05/23/2019   Type II diabetes mellitus, uncontrolled 12/07/2017   History of anemia due to chronic kidney disease 10/11/2017   Vitamin B 12 deficiency 06/09/2017   Great toe amputation status, right 09/04/2016   Osteomyelitis of toe of right foot (HCC)    Acute renal failure (ARF) (HCC) 05/10/2016   SBO (small bowel obstruction) (HCC) 05/08/2016   Diverticular disease of jejunum s/p resection 05/09/2016 05/08/2016   Normocytic anemia 05/05/2016   Past Medical History:  Diagnosis Date   Anemia    patient preference - stopped iron    ARF (acute renal failure) (HCC) 04/2016   dehydration   Chronic kidney disease    stage 5   DDD (degenerative disc disease), lumbar    Depression    Diabetes mellitus    Type II - pt preference - stopped lantus   Diabetic retinopathy (HCC)    Diverticulitis    History of kidney stones    passed- 6   Hypertension    Lacunar infarction (HCC)    Osteomyelitis (HCC)    Polyneuropathy    Vitamin B 12 deficiency 06/09/2017   Vitamin D deficiency    Wears partial dentures    upper    Family History  Problem Relation Age of Onset   Diabetes Mother    Asthma Sister     Past Surgical History:  Procedure Laterality Date   AMPUTATION Right 08/14/2016   Procedure: RIGHT 1ST RAY AMPUTATION MID-SHAFT;  Surgeon: Nadara Mustard, MD;  Location: [facility] OR;  Service: Orthopedics;  Laterality: Right;   BASCILIC VEIN TRANSPOSITION Left 06/08/2019   Procedure: LEFT BRACHIOCEPHALIC VEIN CREATION;  Surgeon: [name], MD;  Location[facility];  Service: Vascular;  Laterality: Left;   CATARACT EXTRACTION  2018   INCISION AND DRAINAGE ABSCESS Left 02/11/2014   Procedure: INCISION AND DRAINAGE ABSCESS Left Buttock;  Surgeon: Avel Peace, MD;  Location: [facility];  Service: General;  Laterality: Left;   INSERTION OF DIALYSIS CATHETER Left 09/07/2019   Procedure: INSERTION OF LEFT INTERNAL JUGULAR DIALYSIS CATHETER;  Surgeon: Larina Earthly, MD;  Location: [facility] OR;  Service: Vascular;  Laterality: Left;   IR REMOVAL TUN CV CATH W/O FL  09/04/2019   LAPAROSCOPIC SMALL  BOWEL RESECTION N/A 05/09/2016   Procedure: LAPAROSCOPIC SMALL BOWEL RESECTION;  Surgeon: Karie Soda, MD;  Location: [facility];  Service: General;  Laterality: N/A;   LAPAROSCOPY N/A 05/09/2016   Procedure: LAPAROSCOPY DIAGNOSTIC, LYSIS OF ADHESIONS, SMALL BOWEL RESECTION X 2;  Surgeon: Karie Soda, MD;  Location[facility];  Service: General;  Laterality: N/A;   TOE AMPUTATION     left foot great toe   Social History   Occupational History   Not on file  Tobacco Use   Smoking status: Never   Smokeless tobacco: Never  Vaping Use   Vaping status: Never Used  Substance and Sexual Activity   Alcohol use: No    Alcohol/week: 0.0 standard drinks of alcohol   Drug use: No   Sexual activity: Not on file

## 2022-12-16 DIAGNOSIS — D631 Anemia in chronic kidney disease: Secondary | ICD-10-CM | POA: Diagnosis not present

## 2022-12-16 DIAGNOSIS — N186 End stage renal disease: Secondary | ICD-10-CM | POA: Diagnosis not present

## 2022-12-16 DIAGNOSIS — D509 Iron deficiency anemia, unspecified: Secondary | ICD-10-CM | POA: Diagnosis not present

## 2022-12-16 DIAGNOSIS — N189 Chronic kidney disease, unspecified: Secondary | ICD-10-CM | POA: Diagnosis not present

## 2022-12-16 DIAGNOSIS — Z992 Dependence on renal dialysis: Secondary | ICD-10-CM | POA: Diagnosis not present

## 2022-12-16 DIAGNOSIS — N2581 Secondary hyperparathyroidism of renal origin: Secondary | ICD-10-CM | POA: Diagnosis not present

## 2022-12-18 DIAGNOSIS — Z992 Dependence on renal dialysis: Secondary | ICD-10-CM | POA: Diagnosis not present

## 2022-12-18 DIAGNOSIS — N2581 Secondary hyperparathyroidism of renal origin: Secondary | ICD-10-CM | POA: Diagnosis not present

## 2022-12-18 DIAGNOSIS — D631 Anemia in chronic kidney disease: Secondary | ICD-10-CM | POA: Diagnosis not present

## 2022-12-18 DIAGNOSIS — D509 Iron deficiency anemia, unspecified: Secondary | ICD-10-CM | POA: Diagnosis not present

## 2022-12-18 DIAGNOSIS — N189 Chronic kidney disease, unspecified: Secondary | ICD-10-CM | POA: Diagnosis not present

## 2022-12-18 DIAGNOSIS — N186 End stage renal disease: Secondary | ICD-10-CM | POA: Diagnosis not present

## 2022-12-21 DIAGNOSIS — N189 Chronic kidney disease, unspecified: Secondary | ICD-10-CM | POA: Diagnosis not present

## 2022-12-21 DIAGNOSIS — D509 Iron deficiency anemia, unspecified: Secondary | ICD-10-CM | POA: Diagnosis not present

## 2022-12-21 DIAGNOSIS — N2581 Secondary hyperparathyroidism of renal origin: Secondary | ICD-10-CM | POA: Diagnosis not present

## 2022-12-21 DIAGNOSIS — N186 End stage renal disease: Secondary | ICD-10-CM | POA: Diagnosis not present

## 2022-12-21 DIAGNOSIS — Z992 Dependence on renal dialysis: Secondary | ICD-10-CM | POA: Diagnosis not present

## 2022-12-21 DIAGNOSIS — D631 Anemia in chronic kidney disease: Secondary | ICD-10-CM | POA: Diagnosis not present

## 2022-12-22 ENCOUNTER — Ambulatory Visit (HOSPITAL_COMMUNITY)
Admission: RE | Admit: 2022-12-22 | Discharge: 2022-12-22 | Disposition: A | Discharge status: Home / Self Care | Payer: Medicare Other | Source: Ambulatory Visit | Attending: Orthopedic Surgery | Admitting: Orthopedic Surgery

## 2022-12-22 DIAGNOSIS — I739 Peripheral vascular disease, unspecified: Secondary | ICD-10-CM | POA: Insufficient documentation

## 2022-12-22 LAB — VAS US ABI WITH/WO TBI

## 2022-12-23 ENCOUNTER — Other Ambulatory Visit: Payer: Self-pay | Admitting: Orthopedic Surgery

## 2022-12-23 ENCOUNTER — Telehealth: Payer: Self-pay | Admitting: Orthopedic Surgery

## 2022-12-23 DIAGNOSIS — N186 End stage renal disease: Secondary | ICD-10-CM | POA: Diagnosis not present

## 2022-12-23 DIAGNOSIS — D631 Anemia in chronic kidney disease: Secondary | ICD-10-CM | POA: Diagnosis not present

## 2022-12-23 DIAGNOSIS — L97511 Non-pressure chronic ulcer of other part of right foot limited to breakdown of skin: Secondary | ICD-10-CM

## 2022-12-23 DIAGNOSIS — D509 Iron deficiency anemia, unspecified: Secondary | ICD-10-CM | POA: Diagnosis not present

## 2022-12-23 DIAGNOSIS — N189 Chronic kidney disease, unspecified: Secondary | ICD-10-CM | POA: Diagnosis not present

## 2022-12-23 DIAGNOSIS — I739 Peripheral vascular disease, unspecified: Secondary | ICD-10-CM

## 2022-12-23 DIAGNOSIS — N2581 Secondary hyperparathyroidism of renal origin: Secondary | ICD-10-CM | POA: Diagnosis not present

## 2022-12-23 DIAGNOSIS — Z992 Dependence on renal dialysis: Secondary | ICD-10-CM | POA: Diagnosis not present

## 2022-12-23 NOTE — Telephone Encounter (Signed)
Nadara Mustard, MD  Javier Glazier Lets get her in for a consultation with vascular vein surgery.  ABI studies are noncompressible.

## 2022-12-23 NOTE — Telephone Encounter (Signed)
Referral placed.

## 2022-12-23 NOTE — Telephone Encounter (Signed)
Pt informed

## 2022-12-24 DIAGNOSIS — I639 Cerebral infarction, unspecified: Secondary | ICD-10-CM | POA: Diagnosis not present

## 2022-12-24 DIAGNOSIS — N186 End stage renal disease: Secondary | ICD-10-CM | POA: Diagnosis not present

## 2022-12-24 DIAGNOSIS — I10 Essential (primary) hypertension: Secondary | ICD-10-CM | POA: Diagnosis not present

## 2022-12-25 DIAGNOSIS — N186 End stage renal disease: Secondary | ICD-10-CM | POA: Diagnosis not present

## 2022-12-25 DIAGNOSIS — D631 Anemia in chronic kidney disease: Secondary | ICD-10-CM | POA: Diagnosis not present

## 2022-12-25 DIAGNOSIS — R110 Nausea: Secondary | ICD-10-CM | POA: Diagnosis not present

## 2022-12-25 DIAGNOSIS — E1122 Type 2 diabetes mellitus with diabetic chronic kidney disease: Secondary | ICD-10-CM | POA: Diagnosis not present

## 2022-12-25 DIAGNOSIS — Z992 Dependence on renal dialysis: Secondary | ICD-10-CM | POA: Diagnosis not present

## 2022-12-25 DIAGNOSIS — N189 Chronic kidney disease, unspecified: Secondary | ICD-10-CM | POA: Diagnosis not present

## 2022-12-25 DIAGNOSIS — E1129 Type 2 diabetes mellitus with other diabetic kidney complication: Secondary | ICD-10-CM | POA: Diagnosis not present

## 2022-12-25 DIAGNOSIS — D509 Iron deficiency anemia, unspecified: Secondary | ICD-10-CM | POA: Diagnosis not present

## 2022-12-25 DIAGNOSIS — N2581 Secondary hyperparathyroidism of renal origin: Secondary | ICD-10-CM | POA: Diagnosis not present

## 2022-12-28 DIAGNOSIS — R110 Nausea: Secondary | ICD-10-CM | POA: Diagnosis not present

## 2022-12-28 DIAGNOSIS — D509 Iron deficiency anemia, unspecified: Secondary | ICD-10-CM | POA: Diagnosis not present

## 2022-12-28 DIAGNOSIS — Z992 Dependence on renal dialysis: Secondary | ICD-10-CM | POA: Diagnosis not present

## 2022-12-28 DIAGNOSIS — N189 Chronic kidney disease, unspecified: Secondary | ICD-10-CM | POA: Diagnosis not present

## 2022-12-28 DIAGNOSIS — N186 End stage renal disease: Secondary | ICD-10-CM | POA: Diagnosis not present

## 2022-12-28 DIAGNOSIS — N2581 Secondary hyperparathyroidism of renal origin: Secondary | ICD-10-CM | POA: Diagnosis not present

## 2023-01-01 DIAGNOSIS — D509 Iron deficiency anemia, unspecified: Secondary | ICD-10-CM | POA: Diagnosis not present

## 2023-01-01 DIAGNOSIS — N189 Chronic kidney disease, unspecified: Secondary | ICD-10-CM | POA: Diagnosis not present

## 2023-01-01 DIAGNOSIS — N186 End stage renal disease: Secondary | ICD-10-CM | POA: Diagnosis not present

## 2023-01-01 DIAGNOSIS — R110 Nausea: Secondary | ICD-10-CM | POA: Diagnosis not present

## 2023-01-01 DIAGNOSIS — Z992 Dependence on renal dialysis: Secondary | ICD-10-CM | POA: Diagnosis not present

## 2023-01-01 DIAGNOSIS — N2581 Secondary hyperparathyroidism of renal origin: Secondary | ICD-10-CM | POA: Diagnosis not present

## 2023-01-04 DIAGNOSIS — N189 Chronic kidney disease, unspecified: Secondary | ICD-10-CM | POA: Diagnosis not present

## 2023-01-04 DIAGNOSIS — N186 End stage renal disease: Secondary | ICD-10-CM | POA: Diagnosis not present

## 2023-01-04 DIAGNOSIS — Z992 Dependence on renal dialysis: Secondary | ICD-10-CM | POA: Diagnosis not present

## 2023-01-04 DIAGNOSIS — N2581 Secondary hyperparathyroidism of renal origin: Secondary | ICD-10-CM | POA: Diagnosis not present

## 2023-01-04 DIAGNOSIS — R110 Nausea: Secondary | ICD-10-CM | POA: Diagnosis not present

## 2023-01-04 DIAGNOSIS — D509 Iron deficiency anemia, unspecified: Secondary | ICD-10-CM | POA: Diagnosis not present

## 2023-01-06 DIAGNOSIS — N189 Chronic kidney disease, unspecified: Secondary | ICD-10-CM | POA: Diagnosis not present

## 2023-01-06 DIAGNOSIS — D509 Iron deficiency anemia, unspecified: Secondary | ICD-10-CM | POA: Diagnosis not present

## 2023-01-06 DIAGNOSIS — N2581 Secondary hyperparathyroidism of renal origin: Secondary | ICD-10-CM | POA: Diagnosis not present

## 2023-01-06 DIAGNOSIS — R110 Nausea: Secondary | ICD-10-CM | POA: Diagnosis not present

## 2023-01-06 DIAGNOSIS — Z992 Dependence on renal dialysis: Secondary | ICD-10-CM | POA: Diagnosis not present

## 2023-01-06 DIAGNOSIS — N186 End stage renal disease: Secondary | ICD-10-CM | POA: Diagnosis not present

## 2023-01-08 DIAGNOSIS — Z992 Dependence on renal dialysis: Secondary | ICD-10-CM | POA: Diagnosis not present

## 2023-01-08 DIAGNOSIS — N189 Chronic kidney disease, unspecified: Secondary | ICD-10-CM | POA: Diagnosis not present

## 2023-01-08 DIAGNOSIS — N2581 Secondary hyperparathyroidism of renal origin: Secondary | ICD-10-CM | POA: Diagnosis not present

## 2023-01-08 DIAGNOSIS — N186 End stage renal disease: Secondary | ICD-10-CM | POA: Diagnosis not present

## 2023-01-08 DIAGNOSIS — R110 Nausea: Secondary | ICD-10-CM | POA: Diagnosis not present

## 2023-01-08 DIAGNOSIS — D509 Iron deficiency anemia, unspecified: Secondary | ICD-10-CM | POA: Diagnosis not present

## 2023-01-11 ENCOUNTER — Ambulatory Visit: Payer: Medicare Other | Admitting: Orthopedic Surgery

## 2023-01-11 DIAGNOSIS — Z992 Dependence on renal dialysis: Secondary | ICD-10-CM | POA: Diagnosis not present

## 2023-01-11 DIAGNOSIS — N189 Chronic kidney disease, unspecified: Secondary | ICD-10-CM | POA: Diagnosis not present

## 2023-01-11 DIAGNOSIS — N186 End stage renal disease: Secondary | ICD-10-CM | POA: Diagnosis not present

## 2023-01-11 DIAGNOSIS — D509 Iron deficiency anemia, unspecified: Secondary | ICD-10-CM | POA: Diagnosis not present

## 2023-01-11 DIAGNOSIS — R110 Nausea: Secondary | ICD-10-CM | POA: Diagnosis not present

## 2023-01-11 DIAGNOSIS — N2581 Secondary hyperparathyroidism of renal origin: Secondary | ICD-10-CM | POA: Diagnosis not present

## 2023-01-12 ENCOUNTER — Ambulatory Visit (INDEPENDENT_AMBULATORY_CARE_PROVIDER_SITE_OTHER): Payer: Medicare Other | Admitting: Orthopedic Surgery

## 2023-01-12 ENCOUNTER — Encounter: Payer: Self-pay | Admitting: Orthopedic Surgery

## 2023-01-12 DIAGNOSIS — E11610 Type 2 diabetes mellitus with diabetic neuropathic arthropathy: Secondary | ICD-10-CM | POA: Diagnosis not present

## 2023-01-12 DIAGNOSIS — L97511 Non-pressure chronic ulcer of other part of right foot limited to breakdown of skin: Secondary | ICD-10-CM | POA: Diagnosis not present

## 2023-01-12 DIAGNOSIS — I739 Peripheral vascular disease, unspecified: Secondary | ICD-10-CM | POA: Diagnosis not present

## 2023-01-12 NOTE — Progress Notes (Signed)
Office Visit Note   Patient: Bailey [name]           Date of Birth: 21-Sep-1950           MRN: [id number] Visit Date: 01/12/2023              Requested by: Lewis Moccasin, MD [address] [place],  [place] [address] PCP: Lewis Moccasin, MD  Chief Complaint  Patient presents with   Right Foot - Follow-up      HPI: Patient is a 72 year old woman who presents in follow-up for Charcot collapse right foot with Wagner grade 1 ulceration status post first ray amputation.  Patient is status post ankle-brachial indices October 2019.  Assessment & Plan: Visit Diagnoses:  1. PAD (peripheral artery disease) (HCC)   2. Non-pressure chronic ulcer of other part of right foot limited to breakdown of skin (HCC)   3. Charcot joint of foot due to diabetes (HCC)     Plan: Ulcer was debrided.  Patient was provided a prescription for extra-depth shoes custom orthotics with a spacer for the right foot Charcot collapse Wagner grade 1 ulcer.  Follow-Up Instructions: Return in about 4 weeks (around 02/09/2023).   Ortho Exam  Patient is alert, oriented, no adenopathy, well-dressed, normal affect, normal respiratory effort. Examination patient is a palpable anterior tibial pulse.  By ankle-brachial indices her right ABI is 1.23 with monophasic posterior tibial pulse and biphasic dorsalis pedis pulse on the right and monophasic posterior tibial and dorsalis pedis pulse on the left.  Left is noncompressible.  Patient does have diabetes with end-stage renal disease on dialysis 3 times a week.  Patient has a Wagner grade 1 ulcer beneath the Charcot rocker-bottom deformity of the right foot.  After informed consent a 10 blade knife was used to debride the skin and soft tissue back to healthy viable tissue.  Ulcer measures 2 cm in diameter after debridement and 1 mm deep.  Imaging: No results found. No images are attached to the encounter.  Labs: Lab Results  Component Value Date   HGBA1C 4.8  07/11/2022   HGBA1C 5.5 08/20/2019   HGBA1C 10.2 (H) 05/06/2016   ESRSEDRATE 108 (H) 04/13/2010   REPTSTATUS 10/03/2022 FINAL 09/30/2022   GRAMSTAIN  02/11/2014    ABUNDANT WBC PRESENT,BOTH PMN AND MONONUCLEAR NO SQUAMOUS EPITHELIAL CELLS SEEN FEW GRAM POSITIVE COCCI IN PAIRS IN CLUSTERS Performed at [facility]    GRAMSTAIN  02/11/2014    ABUNDANT WBC PRESENT,BOTH PMN AND MONONUCLEAR NO SQUAMOUS EPITHELIAL CELLS SEEN FEW GRAM POSITIVE COCCI IN PAIRS IN CLUSTERS Performed at [facility]    CULT MULTIPLE SPECIES PRESENT, SUGGEST RECOLLECTION (A) 09/30/2022   LABORGA STAPHYLOCOCCUS AUREUS 09/02/2019     Lab Results  Component Value Date   ALBUMIN 3.5 09/30/2022   ALBUMIN 3.1 (L) 07/14/2022   ALBUMIN 3.1 (L) 07/13/2022    Lab Results  Component Value Date   MG 2.2 07/11/2022   MG 1.7 09/02/2019   MG 1.8 08/20/2019   No results found for: "VD25OH"  No results found for: "PREALBUMIN"    Latest Ref Rng & Units 11/26/2022   10:24 AM 10/01/2022   12:35 AM 09/30/2022   10:25 AM  CBC EXTENDED  WBC 4.0 - 10.5 K/uL 5.6  6.4    RBC 3.87 - 5.11 MIL/uL 3.42  3.12    Hemoglobin 12.0 - 15.0 g/dL 24.4  01.0  27.2   HCT 36.0 - 46.0 % 33.8  31.4  36.0   Platelets 150 - 400 K/uL 81  96    NEUT# 1.7 - 7.7 K/uL 3.2     Lymph# 0.7 - 4.0 K/uL 1.8        There is no height or weight on file to calculate BMI.  Orders:  No orders of the defined types were placed in this encounter.  No orders of the defined types were placed in this encounter.    Procedures: No procedures performed  Clinical Data: No additional findings.  ROS:  All other systems negative, except as noted in the HPI. Review of Systems  Objective: Vital Signs: LMP  (LMP Unknown)   Specialty Comments:  No specialty comments available.  PMFS History: Patient Active Problem List   Diagnosis Date Noted   UTI (urinary tract infection) 10/01/2022   Acute metabolic encephalopathy 09/30/2022    HLD (hyperlipidemia) 09/30/2022   History of CVA (cerebrovascular accident) 09/30/2022   Essential hypertension 07/13/2022   Acute CVA (cerebrovascular accident) (HCC) 07/11/2022   Pyelonephritis 04/27/2021   Malnutrition of moderate degree 09/05/2019   Hypokalemia 09/02/2019   Fever of unknown origin 09/02/2019   ESRD on dialysis (HCC) 08/20/2019   Symptomatic anemia 08/20/2019   Thrombocytopenia (HCC) 08/20/2019   CKD (chronic kidney disease) 05/23/2019   Type II diabetes mellitus, uncontrolled 12/07/2017   History of anemia due to chronic kidney disease 10/11/2017   Vitamin B 12 deficiency 06/09/2017   Great toe amputation status, right 09/04/2016   Osteomyelitis of toe of right foot (HCC)    Acute renal failure (ARF) (HCC) 05/10/2016   SBO (small bowel obstruction) (HCC) 05/08/2016   Diverticular disease of jejunum s/p resection 05/09/2016 05/08/2016   Normocytic anemia 05/05/2016   Past Medical History:  Diagnosis Date   Anemia    patient preference - stopped iron    ARF (acute renal failure) (HCC) 04/2016   dehydration   Chronic kidney disease    stage 5   DDD (degenerative disc disease), lumbar    Depression    Diabetes mellitus    Type II - pt preference - stopped lantus   Diabetic retinopathy (HCC)    Diverticulitis    History of kidney stones    passed- 6   Hypertension    Lacunar infarction (HCC)    Osteomyelitis (HCC)    Polyneuropathy    Vitamin B 12 deficiency 06/09/2017   Vitamin D deficiency    Wears partial dentures    upper    Family History  Problem Relation Age of Onset   Diabetes Mother    Asthma Sister     Past Surgical History:  Procedure Laterality Date   AMPUTATION Right 08/14/2016   Procedure: RIGHT 1ST RAY AMPUTATION MID-SHAFT;  Surgeon: Nadara Mustard, MD;  Location: [facility];  Service: Orthopedics;  Laterality: Right;   BASCILIC VEIN TRANSPOSITION Left 06/08/2019   Procedure: LEFT BRACHIOCEPHALIC VEIN CREATION;  Surgeon: [name], MD;  Location: [facility] OR;  Service: Vascular;  Laterality: Left;   CATARACT EXTRACTION  2018   INCISION AND DRAINAGE ABSCESS Left 02/11/2014   Procedure: INCISION AND DRAINAGE ABSCESS Left Buttock;  Surgeon: Avel Peace, MD;  Location: [facility];  Service: General;  Laterality: Left;   INSERTION OF DIALYSIS CATHETER Left 09/07/2019   Procedure: INSERTION OF LEFT INTERNAL JUGULAR DIALYSIS CATHETER;  Surgeon: Larina Earthly, MD;  Location: [facility];  Service: Vascular;  Laterality: Left;   IR REMOVAL TUN CV CATH W/O FL  09/04/2019   LAPAROSCOPIC  SMALL BOWEL RESECTION N/A 05/09/2016   Procedure: LAPAROSCOPIC SMALL BOWEL RESECTION;  Surgeon: Karie Soda, MD;  Location: [facility];  Service: General;  Laterality: N/A;   LAPAROSCOPY N/A 05/09/2016   Procedure: LAPAROSCOPY DIAGNOSTIC, LYSIS OF ADHESIONS, SMALL BOWEL RESECTION X 2;  Surgeon: Karie Soda, MD;  Location: [facility];  Service: General;  Laterality: N/A;   TOE AMPUTATION     left foot great toe   Social History   Occupational History   Not on file  Tobacco Use   Smoking status: Never   Smokeless tobacco: Never  Vaping Use   Vaping status: Never Used  Substance and Sexual Activity   Alcohol use: No    Alcohol/week: 0.0 standard drinks of alcohol   Drug use: No   Sexual activity: Not on file

## 2023-01-13 DIAGNOSIS — R110 Nausea: Secondary | ICD-10-CM | POA: Diagnosis not present

## 2023-01-13 DIAGNOSIS — Z992 Dependence on renal dialysis: Secondary | ICD-10-CM | POA: Diagnosis not present

## 2023-01-13 DIAGNOSIS — E1122 Type 2 diabetes mellitus with diabetic chronic kidney disease: Secondary | ICD-10-CM | POA: Diagnosis not present

## 2023-01-13 DIAGNOSIS — N2581 Secondary hyperparathyroidism of renal origin: Secondary | ICD-10-CM | POA: Diagnosis not present

## 2023-01-13 DIAGNOSIS — N189 Chronic kidney disease, unspecified: Secondary | ICD-10-CM | POA: Diagnosis not present

## 2023-01-13 DIAGNOSIS — N186 End stage renal disease: Secondary | ICD-10-CM | POA: Diagnosis not present

## 2023-01-13 DIAGNOSIS — D509 Iron deficiency anemia, unspecified: Secondary | ICD-10-CM | POA: Diagnosis not present

## 2023-01-15 ENCOUNTER — Telehealth: Payer: Self-pay

## 2023-01-15 DIAGNOSIS — R110 Nausea: Secondary | ICD-10-CM | POA: Diagnosis not present

## 2023-01-15 DIAGNOSIS — N2581 Secondary hyperparathyroidism of renal origin: Secondary | ICD-10-CM | POA: Diagnosis not present

## 2023-01-15 DIAGNOSIS — N186 End stage renal disease: Secondary | ICD-10-CM | POA: Diagnosis not present

## 2023-01-15 DIAGNOSIS — Z992 Dependence on renal dialysis: Secondary | ICD-10-CM | POA: Diagnosis not present

## 2023-01-15 DIAGNOSIS — N189 Chronic kidney disease, unspecified: Secondary | ICD-10-CM | POA: Diagnosis not present

## 2023-01-15 DIAGNOSIS — D509 Iron deficiency anemia, unspecified: Secondary | ICD-10-CM | POA: Diagnosis not present

## 2023-01-15 NOTE — Telephone Encounter (Signed)
Patient's husband called triage. She had an appointment on the 19th with Dr.Duda. She was given a script for inserts for [facility]. Patient's husband states that [facility] wants [place] to fax the order to them. Please notify patient's husband when complete so he can call them to schedule. His call back #[phone]

## 2023-01-15 NOTE — Telephone Encounter (Signed)
Rx sent to [facility], pt's husband informed.

## 2023-01-17 DIAGNOSIS — N2581 Secondary hyperparathyroidism of renal origin: Secondary | ICD-10-CM | POA: Diagnosis not present

## 2023-01-17 DIAGNOSIS — N189 Chronic kidney disease, unspecified: Secondary | ICD-10-CM | POA: Diagnosis not present

## 2023-01-17 DIAGNOSIS — N186 End stage renal disease: Secondary | ICD-10-CM | POA: Diagnosis not present

## 2023-01-17 DIAGNOSIS — Z992 Dependence on renal dialysis: Secondary | ICD-10-CM | POA: Diagnosis not present

## 2023-01-17 DIAGNOSIS — R110 Nausea: Secondary | ICD-10-CM | POA: Diagnosis not present

## 2023-01-17 DIAGNOSIS — D509 Iron deficiency anemia, unspecified: Secondary | ICD-10-CM | POA: Diagnosis not present

## 2023-01-18 ENCOUNTER — Telehealth: Payer: Self-pay | Admitting: Orthopedic Surgery

## 2023-01-18 NOTE — Telephone Encounter (Signed)
[facility] called and said they got the Rx for the pt but they need the ov notes and a face sheet please fax to [phone]

## 2023-01-18 NOTE — Telephone Encounter (Signed)
Faxed notes to [facility]

## 2023-01-19 DIAGNOSIS — N189 Chronic kidney disease, unspecified: Secondary | ICD-10-CM | POA: Diagnosis not present

## 2023-01-19 DIAGNOSIS — Z992 Dependence on renal dialysis: Secondary | ICD-10-CM | POA: Diagnosis not present

## 2023-01-19 DIAGNOSIS — D509 Iron deficiency anemia, unspecified: Secondary | ICD-10-CM | POA: Diagnosis not present

## 2023-01-19 DIAGNOSIS — N186 End stage renal disease: Secondary | ICD-10-CM | POA: Diagnosis not present

## 2023-01-19 DIAGNOSIS — R110 Nausea: Secondary | ICD-10-CM | POA: Diagnosis not present

## 2023-01-19 DIAGNOSIS — N2581 Secondary hyperparathyroidism of renal origin: Secondary | ICD-10-CM | POA: Diagnosis not present

## 2023-01-22 DIAGNOSIS — D509 Iron deficiency anemia, unspecified: Secondary | ICD-10-CM | POA: Diagnosis not present

## 2023-01-22 DIAGNOSIS — R110 Nausea: Secondary | ICD-10-CM | POA: Diagnosis not present

## 2023-01-22 DIAGNOSIS — N2581 Secondary hyperparathyroidism of renal origin: Secondary | ICD-10-CM | POA: Diagnosis not present

## 2023-01-22 DIAGNOSIS — N186 End stage renal disease: Secondary | ICD-10-CM | POA: Diagnosis not present

## 2023-01-22 DIAGNOSIS — Z992 Dependence on renal dialysis: Secondary | ICD-10-CM | POA: Diagnosis not present

## 2023-01-22 DIAGNOSIS — N189 Chronic kidney disease, unspecified: Secondary | ICD-10-CM | POA: Diagnosis not present

## 2023-01-24 DIAGNOSIS — N186 End stage renal disease: Secondary | ICD-10-CM | POA: Diagnosis not present

## 2023-01-24 DIAGNOSIS — E1129 Type 2 diabetes mellitus with other diabetic kidney complication: Secondary | ICD-10-CM | POA: Diagnosis not present

## 2023-01-24 DIAGNOSIS — Z992 Dependence on renal dialysis: Secondary | ICD-10-CM | POA: Diagnosis not present

## 2023-01-25 DIAGNOSIS — N2581 Secondary hyperparathyroidism of renal origin: Secondary | ICD-10-CM | POA: Diagnosis not present

## 2023-01-25 DIAGNOSIS — N189 Chronic kidney disease, unspecified: Secondary | ICD-10-CM | POA: Diagnosis not present

## 2023-01-25 DIAGNOSIS — N186 End stage renal disease: Secondary | ICD-10-CM | POA: Diagnosis not present

## 2023-01-25 DIAGNOSIS — D509 Iron deficiency anemia, unspecified: Secondary | ICD-10-CM | POA: Diagnosis not present

## 2023-01-25 DIAGNOSIS — E1122 Type 2 diabetes mellitus with diabetic chronic kidney disease: Secondary | ICD-10-CM | POA: Diagnosis not present

## 2023-01-25 DIAGNOSIS — D631 Anemia in chronic kidney disease: Secondary | ICD-10-CM | POA: Diagnosis not present

## 2023-01-25 DIAGNOSIS — Z992 Dependence on renal dialysis: Secondary | ICD-10-CM | POA: Diagnosis not present

## 2023-01-25 NOTE — Progress Notes (Unsigned)
[facility] [place]  ASSESSMENT / PLAN: Bailey [name] is a 72 y.o. female with atherosclerosis of native arteries of right plantar foot causing ulceration.  Recommend:  Abstinence from all tobacco products. Blood glucose control with goal A1c < 7%. Blood pressure control with goal blood pressure < 140/90 mmHg. Lipid reduction therapy with goal LDL-C <100 mg/dL  Aspirin 81mg  PO QD.  Atorvastatin 40-80mg  PO QD (or other "high intensity" statin therapy).  Plan right lower extremity angiogram with possible intervention as dialysis schedule allows.  CHIEF COMPLAINT: Right plantar foot ulceration  HISTORY OF PRESENT ILLNESS: Bailey [name] is a 72 y.o. female referred to clinic for evaluation of right plantar foot ulceration.  The patient has been under the care of Dr. Lajoyce Corners for many years.  She is undergone bilateral great toe amputation remotely.  Patient has some issues with memory, and her daughter-in-law is here to supplement her history.  Patient is fairly sedentary.  She does not describe claudication symptoms.  She does not have rest pain symptoms.  Patient does have a Charcot deformity of the right foot with ulceration over the area of bony deformity in the midfoot  Past Medical History:  Diagnosis Date   Anemia    patient preference - stopped iron    ARF (acute renal failure) (HCC) 04/2016   dehydration   Chronic kidney disease    stage 5   DDD (degenerative disc disease), lumbar    Depression    Diabetes mellitus    Type II - pt preference - stopped lantus   Diabetic retinopathy (HCC)    Diverticulitis    History of kidney stones    passed- 6   Hypertension    Lacunar infarction (HCC)    Osteomyelitis (HCC)    Polyneuropathy    Vitamin B 12 deficiency 06/09/2017   Vitamin D deficiency    Wears partial dentures    upper    Past Surgical History:  Procedure Laterality Date   AMPUTATION Right 08/14/2016   Procedure: RIGHT 1ST RAY AMPUTATION  MID-SHAFT;  Surgeon: Nadara Mustard, MD;  [facility]: [facility];  Service: Orthopedics;  Laterality: Right;   BASCILIC VEIN TRANSPOSITION Left 06/08/2019   Procedure: LEFT BRACHIOCEPHALIC VEIN CREATION;  Surgeon: [name], MD;  Location: [facility] OR;  Service: Vascular;  Laterality: Left;   CATARACT EXTRACTION  2018   INCISION AND DRAINAGE ABSCESS Left 02/11/2014   Procedure: INCISION AND DRAINAGE ABSCESS Left Buttock;  Surgeon: Avel Peace, MD;  [facility];  Service: General;  Laterality: Left;   INSERTION OF DIALYSIS CATHETER Left 09/07/2019   Procedure: INSERTION OF LEFT INTERNAL JUGULAR DIALYSIS CATHETER;  Surgeon: Larina Earthly, MD;  [facility];  Service: Vascular;  Laterality: Left;   IR REMOVAL TUN CV CATH W/O FL  09/04/2019   LAPAROSCOPIC SMALL BOWEL RESECTION N/A 05/09/2016   Procedure: LAPAROSCOPIC SMALL BOWEL RESECTION;  Surgeon: Karie Soda, MD;  [facility]: [facility];  Service: General;  Laterality: N/A;   LAPAROSCOPY N/A 05/09/2016   Procedure: LAPAROSCOPY DIAGNOSTIC, LYSIS OF ADHESIONS, SMALL BOWEL RESECTION X 2;  Surgeon: Karie Soda, MD;  [facility];  Service: General;  Laterality: N/A;   TOE AMPUTATION     left foot great toe    Family History  Problem Relation Age of Onset   Diabetes Mother    Asthma Sister     Social History   Socioeconomic History   Marital status: Married    Spouse name:  Not on file   Number of children: Not on file   Years of education: Not on file   Highest education level: Not on file  Occupational History   Not on file  Tobacco Use   Smoking status: Never   Smokeless tobacco: Never  Vaping Use   Vaping status: Never Used  Substance and Sexual Activity   Alcohol use: No    Alcohol/week: 0.0 standard drinks of alcohol   Drug use: No   Sexual activity: Not on file  Other Topics Concern   Not on file  Social History Narrative   Not on file   Social Determinants of Health   Financial Resource Strain: Not on file   Food Insecurity: No Food Insecurity (09/30/2022)   Hunger Vital Sign    Worried About Running Out of Food in the Last Year: Never true    Ran Out of Food in the Last Year: Never true  Transportation Needs: No Transportation Needs (09/30/2022)   PRAPARE - Administrator, Civil Service (Medical): No    Lack of Transportation (Non-Medical): No  Physical Activity: Not on file  Stress: Not on file  Social Connections: Not on file  Intimate Partner Violence: Not At Risk (09/30/2022)   Humiliation, Afraid, Rape, and Kick questionnaire    Fear of Current or Ex-Partner: No    Emotionally Abused: No    Physically Abused: No    Sexually Abused: No    No Known Allergies  Current Outpatient Medications  Medication Sig Dispense Refill   amLODipine (NORVASC) 5 MG tablet Take 1 tablet (5 mg total) by mouth at bedtime. 30 tablet 0   aspirin EC 81 MG tablet Take 1 tablet (81 mg total) by mouth daily. Swallow whole. 120 tablet 0   atorvastatin (LIPITOR) 40 MG tablet TAKE 1 TABLET(40 MG) BY MOUTH DAILY 90 tablet 0   AURYXIA 1 GM 210 MG(Fe) tablet Take 210 mg by mouth 3 (three) times daily.     donepezil (ARICEPT) 5 MG tablet Take 1/2 pill daily for 4 weeks, then increase to 1 pill daily 90 tablet 4   multivitamin (RENA-VIT) TABS tablet Take 1 tablet by mouth daily.     olmesartan (BENICAR) 40 MG tablet Take 1/2 tablet (20 mg total) by mouth daily. 15 tablet 0   No current facility-administered medications for this visit.    PHYSICAL EXAM There were no vitals filed for this visit.  Chronically ill woman in no distress Regular rate and rhythm Unlabored breathing No palpable right pedal pulse Weakly palpable left dorsalis pedis pulse Right Charcot foot Plantar ulceration about the size of a quarter over the Charcot deformity in the midfoot on the right  PERTINENT LABORATORY AND RADIOLOGIC DATA  Most recent CBC    Latest Ref Rng & Units 11/26/2022   10:24 AM 10/01/2022   12:35 AM  09/30/2022   10:25 AM  CBC  WBC 4.0 - 10.5 K/uL 5.6  6.4    Hemoglobin 12.0 - 15.0 g/dL 40.9  81.1  91.4   Hematocrit 36.0 - 46.0 % 33.8  31.4  36.0   Platelets 150 - 400 K/uL 81  96       Most recent CMP    Latest Ref Rng & Units 10/01/2022   12:35 AM 09/30/2022   10:25 AM 09/30/2022   10:16 AM  CMP  Glucose 70 - 99 mg/dL 782  97  94   BUN 8 - 23 mg/dL 61  47  47   Creatinine 0.44 - 1.00 mg/dL 1.61  0.96  0.45   Sodium 135 - 145 mmol/L 138  139  137   Potassium 3.5 - 5.1 mmol/L 4.0  4.4  4.4   Chloride 98 - 111 mmol/L 94  100  94   CO2 22 - 32 mmol/L 29   25   Calcium 8.9 - 10.3 mg/dL 8.5   9.1   Total Protein 6.5 - 8.1 g/dL   6.9   Total Bilirubin 0.3 - 1.2 mg/dL   0.5   Alkaline Phos 38 - 126 U/L   104   AST 15 - 41 U/L   20   ALT 0 - 44 U/L   13     Renal function CrCl cannot be calculated (Patient's most recent lab result is older than the maximum 21 days allowed.).  Hgb A1c MFr Bld (%)  Date Value  07/11/2022 4.8    LDL Chol Calc (NIH)  Date Value Ref Range Status  12/22/2021 116 (H) 0 - 99 mg/dL Final   LDL Cholesterol  Date Value Ref Range Status  07/12/2022 46 0 - 99 mg/dL Final    Comment:           Total Cholesterol/HDL:CHD Risk Coronary Heart Disease Risk Table                     Men   Women  1/2 Average Risk   3.4   3.3  Average Risk       5.0   4.4  2 X Average Risk   9.6   7.1  3 X Average Risk  23.4   11.0        Use the calculated Patient Ratio above and the CHD Risk Table to determine the patient's CHD Risk.        ATP III CLASSIFICATION (LDL):  <100     mg/dL   Optimal  409-811  mg/dL   Near or Above                    Optimal  130-159  mg/dL   Borderline  914-782  mg/dL   High  >956     mg/dL   Very High Performed at [facility], [address] [place], [place] [address]     +-------+-----------+-----------+------------+------------+  ABI/TBIToday's ABIToday's TBIPrevious ABIPrevious TBI   +-------+-----------+-----------+------------+------------+  Right [place]         -          1.23        -             +-------+-----------+-----------+------------+------------+  Left  [place]         -          [place]          -             +-------+-----------+-----------+------------+------------+   Rande Brunt. Lenell Antu, MD FACS [facility] Phone Number: [phone] 01/25/2023 9:01 AM   Total time spent on preparing this encounter including chart review, data review, collecting history, examining the patient, coordinating care for this new patient, 60 minutes.  Portions of this report may have been transcribed using voice recognition software.  Every effort has been made to ensure accuracy; however, inadvertent computerized transcription errors may still be present.

## 2023-01-26 ENCOUNTER — Ambulatory Visit (INDEPENDENT_AMBULATORY_CARE_PROVIDER_SITE_OTHER): Payer: Medicare Other | Admitting: Vascular Surgery

## 2023-01-26 ENCOUNTER — Encounter: Payer: Self-pay | Admitting: Vascular Surgery

## 2023-01-26 VITALS — BP 161/66 | HR 61 | Temp 98.3°F | Resp 20 | Ht 64.0 in | Wt 109.0 lb

## 2023-01-26 DIAGNOSIS — I70234 Atherosclerosis of native arteries of right leg with ulceration of heel and midfoot: Secondary | ICD-10-CM

## 2023-01-27 ENCOUNTER — Telehealth: Payer: Self-pay

## 2023-01-27 DIAGNOSIS — N2581 Secondary hyperparathyroidism of renal origin: Secondary | ICD-10-CM | POA: Diagnosis not present

## 2023-01-27 DIAGNOSIS — Z992 Dependence on renal dialysis: Secondary | ICD-10-CM | POA: Diagnosis not present

## 2023-01-27 DIAGNOSIS — E1122 Type 2 diabetes mellitus with diabetic chronic kidney disease: Secondary | ICD-10-CM | POA: Diagnosis not present

## 2023-01-27 DIAGNOSIS — N189 Chronic kidney disease, unspecified: Secondary | ICD-10-CM | POA: Diagnosis not present

## 2023-01-27 DIAGNOSIS — D509 Iron deficiency anemia, unspecified: Secondary | ICD-10-CM | POA: Diagnosis not present

## 2023-01-27 DIAGNOSIS — N186 End stage renal disease: Secondary | ICD-10-CM | POA: Diagnosis not present

## 2023-01-27 NOTE — Telephone Encounter (Signed)
Attempted to reach pt to schedule her AGM. Left VM for her to return our call.

## 2023-01-29 ENCOUNTER — Other Ambulatory Visit: Payer: Self-pay

## 2023-01-29 DIAGNOSIS — N189 Chronic kidney disease, unspecified: Secondary | ICD-10-CM | POA: Diagnosis not present

## 2023-01-29 DIAGNOSIS — E1122 Type 2 diabetes mellitus with diabetic chronic kidney disease: Secondary | ICD-10-CM | POA: Diagnosis not present

## 2023-01-29 DIAGNOSIS — D509 Iron deficiency anemia, unspecified: Secondary | ICD-10-CM | POA: Diagnosis not present

## 2023-01-29 DIAGNOSIS — I70234 Atherosclerosis of native arteries of right leg with ulceration of heel and midfoot: Secondary | ICD-10-CM

## 2023-01-29 DIAGNOSIS — N186 End stage renal disease: Secondary | ICD-10-CM | POA: Diagnosis not present

## 2023-01-29 DIAGNOSIS — N2581 Secondary hyperparathyroidism of renal origin: Secondary | ICD-10-CM | POA: Diagnosis not present

## 2023-01-29 DIAGNOSIS — Z992 Dependence on renal dialysis: Secondary | ICD-10-CM | POA: Diagnosis not present

## 2023-01-29 MED ORDER — SODIUM CHLORIDE 0.9 % IV SOLN: 250.0000 mL | INTRAVENOUS | Status: AC | PRN

## 2023-01-29 MED ORDER — SODIUM CHLORIDE 0.9% FLUSH: 3.0000 mL | INTRAVENOUS | Status: DC | PRN

## 2023-01-29 NOTE — Telephone Encounter (Signed)
Spoke with patient's husband regarding scheduling aortogram with Dr. Lenell Antu. He reports patient has HD on M/W/F and does not wish to change. Agreed to schedule procedure on 12/19 with Dr. Chestine Spore. Instructions provided and he voiced understanding and also requested I contact his daughter-in-law, Mikiah Kobs. Attempted to reach Tammy- left VM to return call.

## 2023-02-01 DIAGNOSIS — Z992 Dependence on renal dialysis: Secondary | ICD-10-CM | POA: Diagnosis not present

## 2023-02-01 DIAGNOSIS — D509 Iron deficiency anemia, unspecified: Secondary | ICD-10-CM | POA: Diagnosis not present

## 2023-02-01 DIAGNOSIS — E1122 Type 2 diabetes mellitus with diabetic chronic kidney disease: Secondary | ICD-10-CM | POA: Diagnosis not present

## 2023-02-01 DIAGNOSIS — N186 End stage renal disease: Secondary | ICD-10-CM | POA: Diagnosis not present

## 2023-02-01 DIAGNOSIS — N2581 Secondary hyperparathyroidism of renal origin: Secondary | ICD-10-CM | POA: Diagnosis not present

## 2023-02-01 DIAGNOSIS — N189 Chronic kidney disease, unspecified: Secondary | ICD-10-CM | POA: Diagnosis not present

## 2023-02-03 DIAGNOSIS — Z992 Dependence on renal dialysis: Secondary | ICD-10-CM | POA: Diagnosis not present

## 2023-02-03 DIAGNOSIS — N189 Chronic kidney disease, unspecified: Secondary | ICD-10-CM | POA: Diagnosis not present

## 2023-02-03 DIAGNOSIS — N2581 Secondary hyperparathyroidism of renal origin: Secondary | ICD-10-CM | POA: Diagnosis not present

## 2023-02-03 DIAGNOSIS — E1122 Type 2 diabetes mellitus with diabetic chronic kidney disease: Secondary | ICD-10-CM | POA: Diagnosis not present

## 2023-02-03 DIAGNOSIS — N186 End stage renal disease: Secondary | ICD-10-CM | POA: Diagnosis not present

## 2023-02-03 DIAGNOSIS — D509 Iron deficiency anemia, unspecified: Secondary | ICD-10-CM | POA: Diagnosis not present

## 2023-02-04 ENCOUNTER — Ambulatory Visit: Payer: Medicare Other | Admitting: Orthopedic Surgery

## 2023-02-05 DIAGNOSIS — N186 End stage renal disease: Secondary | ICD-10-CM | POA: Diagnosis not present

## 2023-02-05 DIAGNOSIS — N189 Chronic kidney disease, unspecified: Secondary | ICD-10-CM | POA: Diagnosis not present

## 2023-02-05 DIAGNOSIS — E1122 Type 2 diabetes mellitus with diabetic chronic kidney disease: Secondary | ICD-10-CM | POA: Diagnosis not present

## 2023-02-05 DIAGNOSIS — D509 Iron deficiency anemia, unspecified: Secondary | ICD-10-CM | POA: Diagnosis not present

## 2023-02-05 DIAGNOSIS — Z992 Dependence on renal dialysis: Secondary | ICD-10-CM | POA: Diagnosis not present

## 2023-02-05 DIAGNOSIS — N2581 Secondary hyperparathyroidism of renal origin: Secondary | ICD-10-CM | POA: Diagnosis not present

## 2023-02-08 DIAGNOSIS — D509 Iron deficiency anemia, unspecified: Secondary | ICD-10-CM | POA: Diagnosis not present

## 2023-02-08 DIAGNOSIS — N186 End stage renal disease: Secondary | ICD-10-CM | POA: Diagnosis not present

## 2023-02-08 DIAGNOSIS — N2581 Secondary hyperparathyroidism of renal origin: Secondary | ICD-10-CM | POA: Diagnosis not present

## 2023-02-08 DIAGNOSIS — Z992 Dependence on renal dialysis: Secondary | ICD-10-CM | POA: Diagnosis not present

## 2023-02-08 DIAGNOSIS — E1122 Type 2 diabetes mellitus with diabetic chronic kidney disease: Secondary | ICD-10-CM | POA: Diagnosis not present

## 2023-02-08 DIAGNOSIS — N189 Chronic kidney disease, unspecified: Secondary | ICD-10-CM | POA: Diagnosis not present

## 2023-02-09 ENCOUNTER — Encounter: Payer: Self-pay | Admitting: Orthopedic Surgery

## 2023-02-09 ENCOUNTER — Ambulatory Visit (INDEPENDENT_AMBULATORY_CARE_PROVIDER_SITE_OTHER): Payer: Medicare Other | Admitting: Orthopedic Surgery

## 2023-02-09 DIAGNOSIS — E11610 Type 2 diabetes mellitus with diabetic neuropathic arthropathy: Secondary | ICD-10-CM

## 2023-02-09 DIAGNOSIS — L97511 Non-pressure chronic ulcer of other part of right foot limited to breakdown of skin: Secondary | ICD-10-CM

## 2023-02-09 DIAGNOSIS — I739 Peripheral vascular disease, unspecified: Secondary | ICD-10-CM

## 2023-02-09 NOTE — Progress Notes (Signed)
Office Visit Note   Patient: Bailey [name]           Date of Birth: February 21, 1951           MRN: [id number] Visit Date: 02/09/2023              Requested by: Lewis Moccasin, MD [address] [place],  [place] [address] PCP: Lewis Moccasin, MD  Chief Complaint  Patient presents with   Right Foot - Follow-up      HPI: Not familiar patient is a 72 year old woman who is seen for evaluation for Charcot collapse right foot with a plantar ulcer on the Charcot rocker-bottom deformity.  Patient is currently scheduled for angioplasty with Dr. Lenell Antu.  She is on dialysis.  Assessment & Plan: Visit Diagnoses:  1. PAD (peripheral artery disease) (HCC)   2. Non-pressure chronic ulcer of other part of right foot limited to breakdown of skin (HCC)   3. Charcot joint of foot due to diabetes (HCC)     Plan: Ulcer was debrided back to healthy viable tissue ulcer is superficial.  Plan to follow-up after her angioplasty.  Follow-Up Instructions: Return in about 4 weeks (around 03/09/2023).   Ortho Exam  Patient is alert, oriented, no adenopathy, well-dressed, normal affect, normal respiratory effort. Examination patient has a Charcot rocker-bottom deformity right foot.  There is no cellulitis no clinical signs of an active Charcot process.  She has a Wagner grade 1 ulcer beneath the Charcot rocker-bottom deformity.  After informed consent a 10 blade knife was used to debride the skin and soft tissue back to healthy viable tissue.  The ulcer is 3 cm in diameter and 1 mm deep after debridement.  This was touched with silver nitrate there was healthy granulation tissue.  Recent ankle-brachial indices studies shows monophasic flow with noncompressible vessels.  Imaging: No results found. No images are attached to the encounter.  Labs: Lab Results  Component Value Date   HGBA1C 4.8 07/11/2022   HGBA1C 5.5 08/20/2019   HGBA1C 10.2 (H) 05/06/2016   ESRSEDRATE 108 (H) 04/13/2010   REPTSTATUS  10/03/2022 FINAL 09/30/2022   GRAMSTAIN  02/11/2014    ABUNDANT WBC PRESENT,BOTH PMN AND MONONUCLEAR NO SQUAMOUS EPITHELIAL CELLS SEEN FEW GRAM POSITIVE COCCI IN PAIRS IN CLUSTERS Performed at [facility]    GRAMSTAIN  02/11/2014    ABUNDANT WBC PRESENT,BOTH PMN AND MONONUCLEAR NO SQUAMOUS EPITHELIAL CELLS SEEN FEW GRAM POSITIVE COCCI IN PAIRS IN CLUSTERS Performed at [facility]    CULT MULTIPLE SPECIES PRESENT, SUGGEST RECOLLECTION (A) 09/30/2022   LABORGA STAPHYLOCOCCUS AUREUS 09/02/2019     Lab Results  Component Value Date   ALBUMIN 3.5 09/30/2022   ALBUMIN 3.1 (L) 07/14/2022   ALBUMIN 3.1 (L) 07/13/2022    Lab Results  Component Value Date   MG 2.2 07/11/2022   MG 1.7 09/02/2019   MG 1.8 08/20/2019   No results found for: "VD25OH"  No results found for: "PREALBUMIN"    Latest Ref Rng & Units 11/26/2022   10:24 AM 10/01/2022   12:35 AM 09/30/2022   10:25 AM  CBC EXTENDED  WBC 4.0 - 10.5 K/uL 5.6  6.4    RBC 3.87 - 5.11 MIL/uL 3.42  3.12    Hemoglobin 12.0 - 15.0 g/dL 19.1  47.8  29.5   HCT 36.0 - 46.0 % 33.8  31.4  36.0   Platelets 150 - 400 K/uL 81  96    NEUT# 1.7 - 7.7 K/uL  3.2     Lymph# 0.7 - 4.0 K/uL 1.8        There is no height or weight on file to calculate BMI.  Orders:  No orders of the defined types were placed in this encounter.  No orders of the defined types were placed in this encounter.    Procedures: No procedures performed  Clinical Data: No additional findings.  ROS:  All other systems negative, except as noted in the HPI. Review of Systems  Objective: Vital Signs: LMP  (LMP Unknown)   Specialty Comments:  No specialty comments available.  PMFS History: Patient Active Problem List   Diagnosis Date Noted   UTI (urinary tract infection) 10/01/2022   Acute metabolic encephalopathy 09/30/2022   HLD (hyperlipidemia) 09/30/2022   History of CVA (cerebrovascular accident) 09/30/2022   Essential  hypertension 07/13/2022   Acute CVA (cerebrovascular accident) (HCC) 07/11/2022   Pyelonephritis 04/27/2021   Malnutrition of moderate degree 09/05/2019   Hypokalemia 09/02/2019   Fever of unknown origin 09/02/2019   ESRD on dialysis (HCC) 08/20/2019   Symptomatic anemia 08/20/2019   Thrombocytopenia (HCC) 08/20/2019   CKD (chronic kidney disease) 05/23/2019   Type II diabetes mellitus, uncontrolled 12/07/2017   History of anemia due to chronic kidney disease 10/11/2017   Vitamin B 12 deficiency 06/09/2017   Great toe amputation status, right 09/04/2016   Osteomyelitis of toe of right foot (HCC)    Acute renal failure (ARF) (HCC) 05/10/2016   SBO (small bowel obstruction) (HCC) 05/08/2016   Diverticular disease of jejunum s/p resection 05/09/2016 05/08/2016   Normocytic anemia 05/05/2016   Past Medical History:  Diagnosis Date   Anemia    patient preference - stopped iron    ARF (acute renal failure) (HCC) 04/2016   dehydration   Chronic kidney disease    stage 5   DDD (degenerative disc disease), lumbar    Depression    Diabetes mellitus    Type II - pt preference - stopped lantus   Diabetic retinopathy (HCC)    Diverticulitis    History of kidney stones    passed- 6   Hypertension    Lacunar infarction (HCC)    Osteomyelitis (HCC)    Polyneuropathy    Vitamin B 12 deficiency 06/09/2017   Vitamin D deficiency    Wears partial dentures    upper    Family History  Problem Relation Age of Onset   Diabetes Mother    Asthma Sister     Past Surgical History:  Procedure Laterality Date   AMPUTATION Right 08/14/2016   Procedure: RIGHT 1ST RAY AMPUTATION MID-SHAFT;  Surgeon: Nadara Mustard, MD;  Location: [facility] OR;  Service: Orthopedics;  Laterality: Right;   BASCILIC VEIN TRANSPOSITION Left 06/08/2019   Procedure: LEFT BRACHIOCEPHALIC VEIN CREATION;  Surgeon: [name], MD;  Location: [facility] OR;  Service: Vascular;  Laterality: Left;   CATARACT EXTRACTION  2018    INCISION AND DRAINAGE ABSCESS Left 02/11/2014   Procedure: INCISION AND DRAINAGE ABSCESS Left Buttock;  Surgeon: Avel Peace, MD;  Location: [facility] ORS;  Service: General;  Laterality: Left;   INSERTION OF DIALYSIS CATHETER Left 09/07/2019   Procedure: INSERTION OF LEFT INTERNAL JUGULAR DIALYSIS CATHETER;  Surgeon: Larina Earthly, MD;  Location: [facility] OR;  Service: Vascular;  Laterality: Left;   IR REMOVAL TUN CV CATH W/O FL  09/04/2019   LAPAROSCOPIC SMALL BOWEL RESECTION N/A 05/09/2016   Procedure: LAPAROSCOPIC SMALL BOWEL RESECTION;  Surgeon: Karie Soda, MD;  Location:  [facility] ORS;  Service: General;  Laterality: N/A;   LAPAROSCOPY N/A 05/09/2016   Procedure: LAPAROSCOPY DIAGNOSTIC, LYSIS OF ADHESIONS, SMALL BOWEL RESECTION X 2;  Surgeon: Karie Soda, MD;  Location: [facility]S;  Service: General;  Laterality: N/A;   TOE AMPUTATION     left foot great toe   Social History   Occupational History   Not on file  Tobacco Use   Smoking status: Never   Smokeless tobacco: Never  Vaping Use   Vaping status: Never Used  Substance and Sexual Activity   Alcohol use: No    Alcohol/week: 0.0 standard drinks of alcohol   Drug use: No   Sexual activity: Not on file

## 2023-02-10 DIAGNOSIS — N2581 Secondary hyperparathyroidism of renal origin: Secondary | ICD-10-CM | POA: Diagnosis not present

## 2023-02-10 DIAGNOSIS — N189 Chronic kidney disease, unspecified: Secondary | ICD-10-CM | POA: Diagnosis not present

## 2023-02-10 DIAGNOSIS — Z992 Dependence on renal dialysis: Secondary | ICD-10-CM | POA: Diagnosis not present

## 2023-02-10 DIAGNOSIS — E1122 Type 2 diabetes mellitus with diabetic chronic kidney disease: Secondary | ICD-10-CM | POA: Diagnosis not present

## 2023-02-10 DIAGNOSIS — D509 Iron deficiency anemia, unspecified: Secondary | ICD-10-CM | POA: Diagnosis not present

## 2023-02-10 DIAGNOSIS — N186 End stage renal disease: Secondary | ICD-10-CM | POA: Diagnosis not present

## 2023-02-11 ENCOUNTER — Other Ambulatory Visit: Payer: Self-pay

## 2023-02-11 ENCOUNTER — Ambulatory Visit (HOSPITAL_COMMUNITY)
Admission: RE | Admit: 2023-02-11 | Discharge: 2023-02-11 | Disposition: A | Discharge status: Home / Self Care | Payer: Medicare Other | Source: Ambulatory Visit | Attending: Vascular Surgery | Admitting: Vascular Surgery

## 2023-02-11 ENCOUNTER — Encounter (HOSPITAL_COMMUNITY)
Admission: RE | Disposition: A | Discharge status: Home / Self Care | Payer: Self-pay | Source: Ambulatory Visit | Attending: Vascular Surgery

## 2023-02-11 DIAGNOSIS — E11621 Type 2 diabetes mellitus with foot ulcer: Secondary | ICD-10-CM | POA: Insufficient documentation

## 2023-02-11 DIAGNOSIS — M14671 Charcot's joint, right ankle and foot: Secondary | ICD-10-CM | POA: Insufficient documentation

## 2023-02-11 DIAGNOSIS — Z89411 Acquired absence of right great toe: Secondary | ICD-10-CM | POA: Diagnosis not present

## 2023-02-11 DIAGNOSIS — L97419 Non-pressure chronic ulcer of right heel and midfoot with unspecified severity: Secondary | ICD-10-CM | POA: Insufficient documentation

## 2023-02-11 DIAGNOSIS — E1122 Type 2 diabetes mellitus with diabetic chronic kidney disease: Secondary | ICD-10-CM | POA: Insufficient documentation

## 2023-02-11 DIAGNOSIS — I120 Hypertensive chronic kidney disease with stage 5 chronic kidney disease or end stage renal disease: Secondary | ICD-10-CM | POA: Diagnosis not present

## 2023-02-11 DIAGNOSIS — I70234 Atherosclerosis of native arteries of right leg with ulceration of heel and midfoot: Secondary | ICD-10-CM

## 2023-02-11 DIAGNOSIS — Z89412 Acquired absence of left great toe: Secondary | ICD-10-CM | POA: Insufficient documentation

## 2023-02-11 DIAGNOSIS — N186 End stage renal disease: Secondary | ICD-10-CM | POA: Insufficient documentation

## 2023-02-11 DIAGNOSIS — E1151 Type 2 diabetes mellitus with diabetic peripheral angiopathy without gangrene: Secondary | ICD-10-CM | POA: Diagnosis not present

## 2023-02-11 HISTORY — PX: ABDOMINAL AORTOGRAM W/LOWER EXTREMITY: CATH118223

## 2023-02-11 LAB — POCT I-STAT, CHEM 8
BUN: 39 mg/dL — ABNORMAL HIGH (ref 8–23)
Calcium, Ion: 1.11 mmol/L — ABNORMAL LOW (ref 1.15–1.40)
Chloride: 96 mmol/L — ABNORMAL LOW (ref 98–111)
Creatinine, Ser: 3.8 mg/dL — ABNORMAL HIGH (ref 0.44–1.00)
Glucose, Bld: 123 mg/dL — ABNORMAL HIGH (ref 70–99)
HCT: 36 % (ref 36.0–46.0)
Hemoglobin: 12.2 g/dL (ref 12.0–15.0)
Potassium: 3.9 mmol/L (ref 3.5–5.1)
Sodium: 138 mmol/L (ref 135–145)
TCO2: 31 mmol/L (ref 22–32)

## 2023-02-11 SURGERY — ABDOMINAL AORTOGRAM W/LOWER EXTREMITY
Anesthesia: LOCAL | Laterality: Right

## 2023-02-11 MED ORDER — ONDANSETRON HCL 4 MG/2ML IJ SOLN: 4.0000 mg | Freq: Four times a day (QID) | INTRAMUSCULAR | Status: DC | PRN

## 2023-02-11 MED ORDER — SODIUM CHLORIDE 0.9 % IV SOLN: 250.0000 mL | INTRAVENOUS | Status: DC | PRN

## 2023-02-11 MED ORDER — SODIUM CHLORIDE 0.9% FLUSH: 3.0000 mL | Freq: Two times a day (BID) | INTRAVENOUS | Status: DC

## 2023-02-11 MED ORDER — ACETAMINOPHEN 325 MG PO TABS: 650.0000 mg | ORAL_TABLET | ORAL | Status: DC | PRN

## 2023-02-11 MED ORDER — FENTANYL CITRATE (PF) 100 MCG/2ML IJ SOLN
INTRAMUSCULAR | Status: AC
  Filled 2023-02-11: qty 2

## 2023-02-11 MED ORDER — LIDOCAINE HCL (PF) 1 % IJ SOLN
INTRAMUSCULAR | Status: AC
  Filled 2023-02-11: qty 30

## 2023-02-11 MED ORDER — LABETALOL HCL 5 MG/ML IV SOLN: 10.0000 mg | INTRAVENOUS | Status: DC | PRN

## 2023-02-11 MED ORDER — SODIUM CHLORIDE 0.9% FLUSH: 3.0000 mL | INTRAVENOUS | Status: DC | PRN

## 2023-02-11 MED ORDER — HYDRALAZINE HCL 20 MG/ML IJ SOLN: 5.0000 mg | INTRAMUSCULAR | Status: DC | PRN

## 2023-02-11 MED ORDER — FENTANYL CITRATE (PF) 100 MCG/2ML IJ SOLN
INTRAMUSCULAR | Status: DC | PRN
  Administered 2023-02-11: 25 ug via INTRAVENOUS

## 2023-02-11 MED ORDER — LIDOCAINE HCL (PF) 1 % IJ SOLN
INTRAMUSCULAR | Status: DC | PRN
  Administered 2023-02-11: 10 mL

## 2023-02-11 MED ORDER — HYDRALAZINE HCL 20 MG/ML IJ SOLN
INTRAMUSCULAR | Status: DC | PRN
  Administered 2023-02-11: 10 mg via INTRAVENOUS

## 2023-02-11 MED ORDER — IODIXANOL 320 MG/ML IV SOLN
INTRAVENOUS | Status: DC | PRN
  Administered 2023-02-11: 40 mL

## 2023-02-11 MED ORDER — HYDRALAZINE HCL 20 MG/ML IJ SOLN
INTRAMUSCULAR | Status: AC
  Filled 2023-02-11: qty 1

## 2023-02-11 SURGICAL SUPPLY — 12 items
CATH OMNI FLUSH 5F 65CM (CATHETERS) IMPLANT
COVER DOME SNAP 22 D (MISCELLANEOUS) IMPLANT
DEVICE CLOSURE MYNXGRIP 5F (Vascular Products) IMPLANT
GUIDEWIRE ANGLED .035X150CM (WIRE) IMPLANT
KIT MICROPUNCTURE NIT STIFF (SHEATH) IMPLANT
PROTECTION STATION PRESSURIZED (MISCELLANEOUS) ×1
SET ATX-X65L (MISCELLANEOUS) IMPLANT
SHEATH PINNACLE 5F 10CM (SHEATH) IMPLANT
SHEATH PROBE COVER 6X72 (BAG) IMPLANT
STATION PROTECTION PRESSURIZED (MISCELLANEOUS) IMPLANT
TRAY PV CATH (CUSTOM PROCEDURE TRAY) ×1 IMPLANT
WIRE BENTSON .035X145CM (WIRE) IMPLANT

## 2023-02-11 NOTE — Progress Notes (Signed)
Patient walked to the bathroom without difficulties. Left groin site level 0, clean, dry, and intact.

## 2023-02-11 NOTE — Op Note (Signed)
    Patient name: Bailey [name] MRN: [id number] DOB: 01-11-1951 Sex: female  02/11/2023 Pre-operative Diagnosis: Right foot wound Post-operative diagnosis:  Same Surgeon:  [name], MD Procedure Performed: 1.  Ultrasound-guided access left common femoral artery 2.  Aortogram with catheter selection of aorta 3.  Right lower extremity arteriogram with catheter selection of right common femoral 4.  Mynx closure of the left common femoral artery  Indications: Patient is a 72 year old female seen with right foot ulcer with noncompressible ABIs with underlying end-stage renal disease.  She presents today for lower extremity arteriogram with a focus on the right leg with possible invention after risks benefits discussed.  Findings:   Ultrasound guided access left common femoral artery.  Aortogram showed widely patent infrarenal aorta with patent iliac arteries bilaterally.    Right lower extremity runoff showed a patent common femoral and profunda.  The SFA and popliteal artery are patent and calcified without flow-limiting stenosis.  She has an occluded posterior tibial.  Peroneal is atretic and occludes in the distal calf.  Anterior tibial is dominant runoff and is widely patent into the foot with filling of the dorsalis pedis.  No intervention performed as she has inline flow.   Procedure:  The patient was identified in the holding area and taken to room 8.  The patient was then placed supine on the table and prepped and draped in the usual sterile fashion.  A time out was called.  Ultrasound was used to evaluate the left common femoral artery.  It was patent .  A digital ultrasound image was acquired.  A micropuncture needle was used to access the left common femoral artery under ultrasound guidance.  An 018 wire was advanced without resistance and a micropuncture sheath was placed.  The 018 wire was removed and a benson wire was placed.  The micropuncture sheath was exchanged for a 5  french sheath.  An omniflush catheter was advanced over the wire to the level of L-1.  An abdominal angiogram was obtained.  Next, using the omniflush catheter and a benson wire, the aortic bifurcation was crossed and the catheter was placed into theright external iliac artery and right runoff was obtained.  Ultimately after evaluating images no intervention was performed as she has inline flow into the foot through the anterior tibial that is widely patent.  Wires and catheters were removed and we placed a mynx closure in the left groin after the 5 French sheath was removed.  Taken to holding in stable condition.  Plan: Patient is optimized from vascular surgery.  She has inline flow down the right lower extremity through the anterior tibial.  [name], MD [facility]: [phone]

## 2023-02-11 NOTE — H&P (Signed)
History and Physical Interval Note:  02/11/2023 2:12 PM  Bailey [name]  has presented today for surgery, with the diagnosis of Atherosclerosis of native arteries of right leg with ulceration of heel and midfoot.  The various methods of treatment have been discussed with the patient and family. After consideration of risks, benefits and other options for treatment, the patient has consented to  Procedure(s): ABDOMINAL AORTOGRAM W/LOWER EXTREMITY (N/A) as a surgical intervention.  The patient's history has been reviewed, patient examined, no change in status, stable for surgery.  I have reviewed the patient's chart and labs.  Questions were answered to the patient's satisfaction.     [name]  Expand All Collapse All  [facility]    ASSESSMENT / PLAN: Bailey [name] is a 72 y.o. female with atherosclerosis of native arteries of right plantar foot causing ulceration.   Recommend:  Abstinence from all tobacco products. Blood glucose control with goal A1c < 7%. Blood pressure control with goal blood pressure < 140/90 mmHg. Lipid reduction therapy with goal LDL-C <100 mg/dL  Aspirin 81mg  PO QD.  Atorvastatin 40-80mg  PO QD (or other "high intensity" statin therapy).   Plan right lower extremity angiogram with possible intervention as dialysis schedule allows.   CHIEF COMPLAINT: Right plantar foot ulceration   HISTORY OF PRESENT ILLNESS: Bailey [name] is a 72 y.o. female referred to clinic for evaluation of right plantar foot ulceration.  The patient has been under the care of Dr. Lajoyce Corners for many years.  She is undergone bilateral great toe amputation remotely.  Patient has some issues with memory, and her daughter-in-law is here to supplement her history.  Patient is fairly sedentary.  She does not describe claudication symptoms.  She does not have rest pain symptoms.  Patient does have a Charcot deformity of the right foot with ulceration over the  area of bony deformity in the midfoot       Past Medical History:  Diagnosis Date   Anemia      patient preference - stopped iron    ARF (acute renal failure) (HCC) 04/2016    dehydration   Chronic kidney disease      stage 5   DDD (degenerative disc disease), lumbar     Depression     Diabetes mellitus      Type II - pt preference - stopped lantus   Diabetic retinopathy (HCC)     Diverticulitis     History of kidney stones      passed- 6   Hypertension     Lacunar infarction (HCC)     Osteomyelitis (HCC)     Polyneuropathy     Vitamin B 12 deficiency 06/09/2017   Vitamin D deficiency     Wears partial dentures      upper               Past Surgical History:  Procedure Laterality Date   AMPUTATION Right 08/14/2016    Procedure: RIGHT 1ST RAY AMPUTATION MID-SHAFT;  Surgeon: Nadara Mustard, MD;  Location: [facility] OR;  Service: Orthopedics;  Laterality: Right;   BASCILIC VEIN TRANSPOSITION Left 06/08/2019    Procedure: LEFT BRACHIOCEPHALIC VEIN CREATION;  Surgeon: [name], MD;  Location: [facility] OR;  Service: Vascular;  Laterality: Left;   CATARACT EXTRACTION   2018   INCISION AND DRAINAGE ABSCESS Left 02/11/2014    Procedure: INCISION AND DRAINAGE ABSCESS Left Buttock;  Surgeon: Avel Peace, MD;  Location: [facility];  Service: General;  Laterality: Left;   INSERTION OF DIALYSIS CATHETER Left 09/07/2019    Procedure: INSERTION OF LEFT INTERNAL JUGULAR DIALYSIS CATHETER;  Surgeon: Larina Earthly, MD;  Location: [facility] OR;  Service: Vascular;  Laterality: Left;   IR REMOVAL TUN CV CATH W/O FL   09/04/2019   LAPAROSCOPIC SMALL BOWEL RESECTION N/A 05/09/2016    Procedure: LAPAROSCOPIC SMALL BOWEL RESECTION;  Surgeon: Karie Soda, MD;  Location: [facility];  Service: General;  Laterality: N/A;   LAPAROSCOPY N/A 05/09/2016    Procedure: LAPAROSCOPY DIAGNOSTIC, LYSIS OF ADHESIONS, SMALL BOWEL RESECTION X 2;  Surgeon: Karie Soda, MD;  Location: [facility];  Service: General;   Laterality: N/A;   TOE AMPUTATION        left foot great toe               Family History  Problem Relation Age of Onset   Diabetes Mother     Asthma Sister            Social History         Socioeconomic History   Marital status: Married      Spouse name: Not on file   Number of children: Not on file   Years of education: Not on file   Highest education level: Not on file  Occupational History   Not on file  Tobacco Use   Smoking status: Never   Smokeless tobacco: Never  Vaping Use   Vaping status: Never Used  Substance and Sexual Activity   Alcohol use: No      Alcohol/week: 0.0 standard drinks of alcohol   Drug use: No   Sexual activity: Not on file  Other Topics Concern   Not on file  Social History Narrative   Not on file    Social Determinants of Health        Financial Resource Strain: Not on file  Food Insecurity: No Food Insecurity (09/30/2022)    Hunger Vital Sign     Worried About Running Out of Food in the Last Year: Never true     Ran Out of Food in the Last Year: Never true  Transportation Needs: No Transportation Needs (09/30/2022)    PRAPARE - Therapist, art (Medical): No     Lack of Transportation (Non-Medical): No  Physical Activity: Not on file  Stress: Not on file  Social Connections: Not on file  Intimate Partner Violence: Not At Risk (09/30/2022)    Humiliation, Afraid, Rape, and Kick questionnaire     Fear of Current or Ex-Partner: No     Emotionally Abused: No     Physically Abused: No     Sexually Abused: No      Allergies  No Known Allergies           Current Outpatient Medications  Medication Sig Dispense Refill   amLODipine (NORVASC) 5 MG tablet Take 1 tablet (5 mg total) by mouth at bedtime. 30 tablet 0   aspirin EC 81 MG tablet Take 1 tablet (81 mg total) by mouth daily. Swallow whole. 120 tablet 0   atorvastatin (LIPITOR) 40 MG tablet TAKE 1 TABLET(40 MG) BY MOUTH DAILY 90 tablet 0   AURYXIA  1 GM 210 MG(Fe) tablet Take 210 mg by mouth 3 (three) times daily.       donepezil (ARICEPT) 5 MG tablet Take 1/2 pill daily for 4 weeks, then increase to 1 pill daily 90 tablet 4  multivitamin (RENA-VIT) TABS tablet Take 1 tablet by mouth daily.       olmesartan (BENICAR) 40 MG tablet Take 1/2 tablet (20 mg total) by mouth daily. 15 tablet 0      No current facility-administered medications for this visit.        PHYSICAL EXAM There were no vitals filed for this visit.   Chronically ill woman in no distress Regular rate and rhythm Unlabored breathing No palpable right pedal pulse Weakly palpable left dorsalis pedis pulse Right Charcot foot Plantar ulceration about the size of a quarter over the Charcot deformity in the midfoot on the right   PERTINENT LABORATORY AND RADIOLOGIC DATA   Most recent CBC     Latest Ref Rng & Units 11/26/2022   10:24 AM 10/01/2022   12:35 AM 09/30/2022   10:25 AM  CBC  WBC 4.0 - 10.5 K/uL 5.6  6.4     Hemoglobin 12.0 - 15.0 g/dL 44.0  34.7  42.5   Hematocrit 36.0 - 46.0 % 33.8  31.4  36.0   Platelets 150 - 400 K/uL 81  96         Most recent CMP     Latest Ref Rng & Units 10/01/2022   12:35 AM 09/30/2022   10:25 AM 09/30/2022   10:16 AM  CMP  Glucose 70 - 99 mg/dL 956  97  94   BUN 8 - 23 mg/dL 61  47  47   Creatinine 0.44 - 1.00 mg/dL 3.87  5.64  3.32   Sodium 135 - 145 mmol/L 138  139  137   Potassium 3.5 - 5.1 mmol/L 4.0  4.4  4.4   Chloride 98 - 111 mmol/L 94  100  94   CO2 22 - 32 mmol/L 29    25   Calcium 8.9 - 10.3 mg/dL 8.5    9.1   Total Protein 6.5 - 8.1 g/dL     6.9   Total Bilirubin 0.3 - 1.2 mg/dL     0.5   Alkaline Phos 38 - 126 U/L     104   AST 15 - 41 U/L     20   ALT 0 - 44 U/L     13       Renal function CrCl cannot be calculated (Patient's most recent lab result is older than the maximum 21 days allowed.).   Last Labs     Hgb A1c MFr Bld (%)  Date Value  07/11/2022 4.8        Last Labs       LDL Chol Calc  (NIH)  Date Value Ref Range Status  12/22/2021 116 (H) 0 - 99 mg/dL Final           LDL Cholesterol  Date Value Ref Range Status  07/12/2022 46 0 - 99 mg/dL Final      Comment:             Total Cholesterol/HDL:CHD Risk Coronary Heart Disease Risk Table                     Men   Women  1/2 Average Risk   3.4   3.3  Average Risk       5.0   4.4  2 X Average Risk   9.6   7.1  3 X Average Risk  23.4   11.0        Use the calculated Patient Ratio above and the  CHD Risk Table to determine the patient's CHD Risk.        ATP III CLASSIFICATION (LDL):  <100     mg/dL   Optimal  161-096  mg/dL   Near or Above                    Optimal  130-159  mg/dL   Borderline  045-409  mg/dL   High  >811     mg/dL   Very High Performed at [facility], [address], [place], [place] [address]        +-------+-----------+-----------+------------+------------+  ABI/TBIToday's ABIToday's TBIPrevious ABIPrevious TBI  +-------+-----------+-----------+------------+------------+  Right [place]         -          1.23        -             +-------+-----------+-----------+------------+------------+  Left  [place]         -          [place]          -             +-------+-----------+-----------+------------+------------+    Rande Brunt. Lenell Antu, MD [facility] Phone Number: [phone] 01/25/2023 9:01 AM

## 2023-02-12 ENCOUNTER — Emergency Department (HOSPITAL_COMMUNITY)
Admission: EM | Admit: 2023-02-12 | Discharge: 2023-02-12 | Disposition: A | Discharge status: Home / Self Care | Payer: Medicare Other | Attending: Emergency Medicine | Admitting: Emergency Medicine

## 2023-02-12 ENCOUNTER — Other Ambulatory Visit: Payer: Self-pay

## 2023-02-12 ENCOUNTER — Emergency Department (HOSPITAL_COMMUNITY): Payer: Medicare Other

## 2023-02-12 ENCOUNTER — Encounter (HOSPITAL_COMMUNITY): Payer: Self-pay | Admitting: Vascular Surgery

## 2023-02-12 DIAGNOSIS — D509 Iron deficiency anemia, unspecified: Secondary | ICD-10-CM | POA: Diagnosis not present

## 2023-02-12 DIAGNOSIS — R42 Dizziness and giddiness: Secondary | ICD-10-CM | POA: Diagnosis not present

## 2023-02-12 DIAGNOSIS — Z992 Dependence on renal dialysis: Secondary | ICD-10-CM | POA: Diagnosis not present

## 2023-02-12 DIAGNOSIS — I959 Hypotension, unspecified: Secondary | ICD-10-CM | POA: Diagnosis not present

## 2023-02-12 DIAGNOSIS — R031 Nonspecific low blood-pressure reading: Secondary | ICD-10-CM | POA: Diagnosis not present

## 2023-02-12 DIAGNOSIS — N189 Chronic kidney disease, unspecified: Secondary | ICD-10-CM | POA: Diagnosis not present

## 2023-02-12 DIAGNOSIS — F0390 Unspecified dementia without behavioral disturbance: Secondary | ICD-10-CM | POA: Insufficient documentation

## 2023-02-12 DIAGNOSIS — R531 Weakness: Secondary | ICD-10-CM | POA: Diagnosis not present

## 2023-02-12 DIAGNOSIS — N186 End stage renal disease: Secondary | ICD-10-CM | POA: Diagnosis not present

## 2023-02-12 DIAGNOSIS — Z7982 Long term (current) use of aspirin: Secondary | ICD-10-CM | POA: Insufficient documentation

## 2023-02-12 DIAGNOSIS — E1122 Type 2 diabetes mellitus with diabetic chronic kidney disease: Secondary | ICD-10-CM | POA: Diagnosis not present

## 2023-02-12 DIAGNOSIS — N2581 Secondary hyperparathyroidism of renal origin: Secondary | ICD-10-CM | POA: Diagnosis not present

## 2023-02-12 LAB — CBC WITH DIFFERENTIAL/PLATELET
Abs Immature Granulocytes: 0.02 10*3/uL (ref 0.00–0.07)
Basophils Absolute: 0.1 10*3/uL (ref 0.0–0.1)
Basophils Relative: 1 %
Eosinophils Absolute: 0.2 10*3/uL (ref 0.0–0.5)
Eosinophils Relative: 4 %
HCT: 36.1 % (ref 36.0–46.0)
Hemoglobin: 11.3 g/dL — ABNORMAL LOW (ref 12.0–15.0)
Immature Granulocytes: 0 %
Lymphocytes Relative: 36 %
Lymphs Abs: 2.3 10*3/uL (ref 0.7–4.0)
MCH: 32 pg (ref 26.0–34.0)
MCHC: 31.3 g/dL (ref 30.0–36.0)
MCV: 102.3 fL — ABNORMAL HIGH (ref 80.0–100.0)
Monocytes Absolute: 0.6 10*3/uL (ref 0.1–1.0)
Monocytes Relative: 10 %
Neutro Abs: 3.2 10*3/uL (ref 1.7–7.7)
Neutrophils Relative %: 49 %
Platelets: 92 10*3/uL — ABNORMAL LOW (ref 150–400)
RBC: 3.53 MIL/uL — ABNORMAL LOW (ref 3.87–5.11)
RDW: 14.6 % (ref 11.5–15.5)
Smear Review: DECREASED
WBC: 6.5 10*3/uL (ref 4.0–10.5)
nRBC: 0 % (ref 0.0–0.2)

## 2023-02-12 LAB — BASIC METABOLIC PANEL
Anion gap: 12 (ref 5–15)
BUN: 19 mg/dL (ref 8–23)
CO2: 28 mmol/L (ref 22–32)
Calcium: 8.2 mg/dL — ABNORMAL LOW (ref 8.9–10.3)
Chloride: 102 mmol/L (ref 98–111)
Creatinine, Ser: 2.6 mg/dL — ABNORMAL HIGH (ref 0.44–1.00)
GFR, Estimated: 19 mL/min — ABNORMAL LOW (ref >60)
Glucose, Bld: 116 mg/dL — ABNORMAL HIGH (ref 70–99)
Potassium: 3.1 mmol/L — ABNORMAL LOW (ref 3.5–5.1)
Sodium: 142 mmol/L (ref 135–145)

## 2023-02-12 LAB — TROPONIN I (HIGH SENSITIVITY)
Troponin I (High Sensitivity): 47 ng/L — ABNORMAL HIGH (ref <18)
Troponin I (High Sensitivity): 49 ng/L — ABNORMAL HIGH (ref <18)

## 2023-02-12 NOTE — ED Provider Notes (Signed)
[place] EMERGENCY DEPARTMENT AT [facility] Provider Note   CSN: [id number] Arrival date & time: 02/12/23  1644     History {Add pertinent medical, surgical, social history, OB history to HPI:1} Chief Complaint  Patient presents with   Weakness   Hypotension    DESTANEE CIRRINCIONE is a 72 y.o. female.  Level 5 caveat secondary to dementia.  Patient is sent in from dialysis after having low blood pressure possibly feeling weak.  Patient does not recall feeling bad and remembers going to dialysis.  She has no complaints right now.  Unclear if any or how long she was on the dialysis machine today.  She did have an angiogram done yesterday.  Family has not seen any significant bleeding at the site.  The history is provided by the patient.  Weakness Severity:  Unable to specify Onset quality:  Unable to specify Progression:  Resolved Associated symptoms: no abdominal pain, no chest pain, no fever, no lethargy, no loss of consciousness, no nausea, no syncope and no vomiting        Home Medications Prior to Admission medications   Medication Sig Start Date End Date Taking? Authorizing Provider  amLODipine (NORVASC) 10 MG tablet Take 10 mg by mouth daily.    [provider]  ascorbic acid (VITAMIN C) 500 MG tablet Take 500 mg by mouth daily.    [provider]  aspirin EC 81 MG tablet Take 1 tablet (81 mg total) by mouth daily. Swallow whole. 10/02/22   Noralee Stain, DO  atorvastatin (LIPITOR) 40 MG tablet TAKE 1 TABLET(40 MG) BY MOUTH DAILY 09/01/22   Ocie Doyne, MD  AURYXIA 1 GM 210 MG(Fe) tablet Take 210 mg by mouth 3 (three) times daily with meals. 02/06/21   [provider]  carvedilol (COREG) 6.25 MG tablet Take 3.125 mg by mouth 2 (two) times daily. 01/22/23   [provider]  donepezil (ARICEPT) 5 MG tablet Take 1/2 pill daily for 4 weeks, then increase to 1 pill daily Patient taking differently: Take 5 mg by mouth every evening.  12/22/21   Ocie Doyne, MD  multivitamin (RENA-VIT) TABS tablet Take 1 tablet by mouth daily.    [provider]  olmesartan (BENICAR) 40 MG tablet Take 1/2 tablet (20 mg total) by mouth daily. Patient not taking: Reported on 02/05/2023 07/29/22         Allergies    Patient has no known allergies.    Review of Systems   Review of Systems  Constitutional:  Negative for fever.  Cardiovascular:  Negative for chest pain and syncope.  Gastrointestinal:  Negative for abdominal pain, nausea and vomiting.  Neurological:  Positive for weakness. Negative for loss of consciousness.    Physical Exam Updated Vital Signs BP (!) 150/59   Pulse 61   Resp 16   Ht 5\' 3"  (1.6 m)   Wt 50 kg   LMP  (LMP Unknown)   SpO2 100%   BMI 19.53 kg/m  Physical Exam Vitals and nursing note reviewed.  Constitutional:      General: She is not in acute distress.    Appearance: Normal appearance. She is well-developed.  HENT:     Head: Normocephalic and atraumatic.  Eyes:     Conjunctiva/sclera: Conjunctivae normal.  Cardiovascular:     Rate and Rhythm: Normal rate and regular rhythm.     Heart sounds: Murmur heard.  Pulmonary:     Effort: Pulmonary effort is normal. No respiratory distress.  Breath sounds: Normal breath sounds.  Abdominal:     Palpations: Abdomen is soft.     Tenderness: There is no abdominal tenderness.  Musculoskeletal:        General: No deformity.     Cervical back: Neck supple.     Comments: She has a fistula left upper arm with positive thrill.  She has a dressing over her left groin area with no signs of bleeding or significant bruising  Skin:    General: Skin is warm and dry.     Capillary Refill: Capillary refill takes less than 2 seconds.  Neurological:     General: No focal deficit present.     Mental Status: She is alert.     ED Results / Procedures / Treatments   Labs (all labs ordered are listed, but only abnormal results are displayed) Labs  Reviewed  BASIC METABOLIC PANEL  CBC WITH DIFFERENTIAL/PLATELET  TROPONIN I (HIGH SENSITIVITY)    EKG EKG Interpretation Date/Time:  Friday February 12 2023 16:49:44 EST Ventricular Rate:  62 PR Interval:  152 QRS Duration:  105 QT Interval:  470 QTC Calculation: 478 R Axis:   -6  Text Interpretation: Sinus rhythm Abnormal R-wave progression, late transition Probable LVH with secondary repol abnrm Anterior ST elevation, probably due to LVH No significant change since prior 8/24 Confirmed by Meridee Score [phone]) on 02/12/2023 5:02:03 PM  Radiology PERIPHERAL VASCULAR CATHETERIZATION Result Date: 02/11/2023 Images from the original result were not included.   Patient name: AISHAT WOLLIN           MRN: [id number]        DOB: October 10, 1950          Sex: female  02/11/2023 Pre-operative Diagnosis: Right foot wound Post-operative diagnosis:  Same Surgeon:  [name], MD Procedure Performed: 1.  Ultrasound-guided access left common femoral artery 2.  Aortogram with catheter selection of aorta 3.  Right lower extremity arteriogram with catheter selection of right common femoral 4.  Mynx closure of the left common femoral artery  Indications: Patient is a 72 year old female seen with right foot ulcer with noncompressible ABIs with underlying end-stage renal disease.  She presents today for lower extremity arteriogram with a focus on the right leg with possible invention after risks benefits discussed.  Findings:  Ultrasound guided access left common femoral artery.  Aortogram showed widely patent infrarenal aorta with patent iliac arteries bilaterally.   Right lower extremity runoff showed a patent common femoral and profunda.  The SFA and popliteal artery are patent and calcified without flow-limiting stenosis.  She has an occluded posterior tibial.  Peroneal is atretic and occludes in the distal calf.  Anterior tibial is dominant runoff and is widely patent into the foot with filling of the  dorsalis pedis.  No intervention performed as she has inline flow.             Procedure:  The patient was identified in the holding area and taken to room 8.  The patient was then placed supine on the table and prepped and draped in the usual sterile fashion.  A time out was called.  Ultrasound was used to evaluate the left common femoral artery.  It was patent .  A digital ultrasound image was acquired.  A micropuncture needle was used to access the left common femoral artery under ultrasound guidance.  An 018 wire was advanced without resistance and a micropuncture sheath was placed.  The 018 wire was removed and a  benson wire was placed.  The micropuncture sheath was exchanged for a 5 french sheath.  An omniflush catheter was advanced over the wire to the level of L-1.  An abdominal angiogram was obtained.  Next, using the omniflush catheter and a benson wire, the aortic bifurcation was crossed and the catheter was placed into theright external iliac artery and right runoff was obtained.  Ultimately after evaluating images no intervention was performed as she has inline flow into the foot through the anterior tibial that is widely patent.  Wires and catheters were removed and we placed a mynx closure in the left groin after the 5 French sheath was removed.  Taken to holding in stable condition.  Plan: Patient is optimized from vascular surgery.  She has inline flow down the right lower extremity through the anterior tibial.  [name], MD [facility]: [phone]    Procedures Procedures  {Document cardiac monitor, telemetry assessment procedure when appropriate:1}  Medications Ordered in ED Medications - No data to display  ED Course/ Medical Decision Making/ A&P   {   Click here for ABCD2, HEART and other calculatorsREFRESH Note before signing :1}                              Medical Decision Making Amount and/or Complexity of Data  Reviewed Labs: ordered. Radiology: ordered.   This patient complains of ***; this involves an extensive number of treatment Options and is a complaint that carries with it a high risk of complications and morbidity. The differential includes ***  I ordered, reviewed and interpreted labs, which included *** I ordered medication *** and reviewed PMP when indicated. I ordered imaging studies which included *** and I independently    visualized and interpreted imaging which showed *** Additional history obtained from *** Previous records obtained and reviewed *** I consulted *** and discussed lab and imaging findings and discussed disposition.  Cardiac monitoring reviewed, *** Social determinants considered, *** Critical Interventions: ***  After the interventions stated above, I reevaluated the patient and found *** Admission and further testing considered, ***   {Document critical care time when appropriate:1} {Document review of labs and clinical decision tools ie heart score, Chads2Vasc2 etc:1}  {Document your independent review of radiology images, and any outside records:1} {Document your discussion with family members, caretakers, and with consultants:1} {Document social determinants of health affecting pt's care:1} {Document your decision making why or why not admission, treatments were needed:1} Final Clinical Impression(s) / ED Diagnoses Final diagnoses:  None    Rx / DC Orders ED Discharge Orders     None

## 2023-02-12 NOTE — ED Notes (Signed)
Pt was given crackers and water.

## 2023-02-12 NOTE — Discharge Instructions (Signed)
You were seen in the emergency department after an episode of weakness at dialysis.  You had lab work EKG and chest x-ray that did not show an obvious explanation of your symptoms.  You were feeling better here.  Please continue your regular medications and follow-up with your primary care doctor.  Return to the emergency department if any worsening or concerning symptoms.

## 2023-02-12 NOTE — ED Triage Notes (Signed)
Pt BIBGEMS from dialysis today. Pt BP 110 sitting to 80 SBP standing. Did not receive tx today  Hx dementia  L fistula - M/W/F  22 right forearm 150 NS

## 2023-02-12 NOTE — ED Notes (Signed)
PT had surgery yesterday. Pt had angioblast on right side according to the son.

## 2023-02-12 NOTE — ED Notes (Signed)
Pt went to dialysis and was noted to be hypotensive when standing.  Dialysis not started due to hypotension.  Pt's hypotension with EMS improved with laying down.  Pt was A+Ox2.  Pt had no complaints at this time.

## 2023-02-14 DIAGNOSIS — N2581 Secondary hyperparathyroidism of renal origin: Secondary | ICD-10-CM | POA: Diagnosis not present

## 2023-02-14 DIAGNOSIS — D509 Iron deficiency anemia, unspecified: Secondary | ICD-10-CM | POA: Diagnosis not present

## 2023-02-14 DIAGNOSIS — Z992 Dependence on renal dialysis: Secondary | ICD-10-CM | POA: Diagnosis not present

## 2023-02-14 DIAGNOSIS — N189 Chronic kidney disease, unspecified: Secondary | ICD-10-CM | POA: Diagnosis not present

## 2023-02-14 DIAGNOSIS — N186 End stage renal disease: Secondary | ICD-10-CM | POA: Diagnosis not present

## 2023-02-14 DIAGNOSIS — E1122 Type 2 diabetes mellitus with diabetic chronic kidney disease: Secondary | ICD-10-CM | POA: Diagnosis not present

## 2023-02-15 DIAGNOSIS — I509 Heart failure, unspecified: Secondary | ICD-10-CM | POA: Diagnosis not present

## 2023-02-15 DIAGNOSIS — I700 Atherosclerosis of aorta: Secondary | ICD-10-CM | POA: Diagnosis not present

## 2023-02-15 DIAGNOSIS — E119 Type 2 diabetes mellitus without complications: Secondary | ICD-10-CM | POA: Diagnosis not present

## 2023-02-15 DIAGNOSIS — Z136 Encounter for screening for cardiovascular disorders: Secondary | ICD-10-CM | POA: Diagnosis not present

## 2023-02-15 DIAGNOSIS — N186 End stage renal disease: Secondary | ICD-10-CM | POA: Diagnosis not present

## 2023-02-15 DIAGNOSIS — E559 Vitamin D deficiency, unspecified: Secondary | ICD-10-CM | POA: Diagnosis not present

## 2023-02-15 DIAGNOSIS — M8580 Other specified disorders of bone density and structure, unspecified site: Secondary | ICD-10-CM | POA: Diagnosis not present

## 2023-02-15 DIAGNOSIS — N2581 Secondary hyperparathyroidism of renal origin: Secondary | ICD-10-CM | POA: Diagnosis not present

## 2023-02-15 DIAGNOSIS — I132 Hypertensive heart and chronic kidney disease with heart failure and with stage 5 chronic kidney disease, or end stage renal disease: Secondary | ICD-10-CM | POA: Diagnosis not present

## 2023-02-15 DIAGNOSIS — E441 Mild protein-calorie malnutrition: Secondary | ICD-10-CM | POA: Diagnosis not present

## 2023-02-15 DIAGNOSIS — E785 Hyperlipidemia, unspecified: Secondary | ICD-10-CM | POA: Diagnosis not present

## 2023-02-15 DIAGNOSIS — Z79899 Other long term (current) drug therapy: Secondary | ICD-10-CM | POA: Diagnosis not present

## 2023-02-16 DIAGNOSIS — D509 Iron deficiency anemia, unspecified: Secondary | ICD-10-CM | POA: Diagnosis not present

## 2023-02-16 DIAGNOSIS — N189 Chronic kidney disease, unspecified: Secondary | ICD-10-CM | POA: Diagnosis not present

## 2023-02-16 DIAGNOSIS — E1122 Type 2 diabetes mellitus with diabetic chronic kidney disease: Secondary | ICD-10-CM | POA: Diagnosis not present

## 2023-02-16 DIAGNOSIS — N2581 Secondary hyperparathyroidism of renal origin: Secondary | ICD-10-CM | POA: Diagnosis not present

## 2023-02-16 DIAGNOSIS — Z992 Dependence on renal dialysis: Secondary | ICD-10-CM | POA: Diagnosis not present

## 2023-02-16 DIAGNOSIS — N186 End stage renal disease: Secondary | ICD-10-CM | POA: Diagnosis not present

## 2023-02-19 DIAGNOSIS — N189 Chronic kidney disease, unspecified: Secondary | ICD-10-CM | POA: Diagnosis not present

## 2023-02-19 DIAGNOSIS — D509 Iron deficiency anemia, unspecified: Secondary | ICD-10-CM | POA: Diagnosis not present

## 2023-02-19 DIAGNOSIS — N2581 Secondary hyperparathyroidism of renal origin: Secondary | ICD-10-CM | POA: Diagnosis not present

## 2023-02-19 DIAGNOSIS — Z79899 Other long term (current) drug therapy: Secondary | ICD-10-CM | POA: Diagnosis not present

## 2023-02-19 DIAGNOSIS — N186 End stage renal disease: Secondary | ICD-10-CM | POA: Diagnosis not present

## 2023-02-19 DIAGNOSIS — Z992 Dependence on renal dialysis: Secondary | ICD-10-CM | POA: Diagnosis not present

## 2023-02-19 DIAGNOSIS — E1122 Type 2 diabetes mellitus with diabetic chronic kidney disease: Secondary | ICD-10-CM | POA: Diagnosis not present

## 2023-02-21 DIAGNOSIS — E1122 Type 2 diabetes mellitus with diabetic chronic kidney disease: Secondary | ICD-10-CM | POA: Diagnosis not present

## 2023-02-21 DIAGNOSIS — Z992 Dependence on renal dialysis: Secondary | ICD-10-CM | POA: Diagnosis not present

## 2023-02-21 DIAGNOSIS — D509 Iron deficiency anemia, unspecified: Secondary | ICD-10-CM | POA: Diagnosis not present

## 2023-02-21 DIAGNOSIS — N186 End stage renal disease: Secondary | ICD-10-CM | POA: Diagnosis not present

## 2023-02-21 DIAGNOSIS — N2581 Secondary hyperparathyroidism of renal origin: Secondary | ICD-10-CM | POA: Diagnosis not present

## 2023-02-21 DIAGNOSIS — N189 Chronic kidney disease, unspecified: Secondary | ICD-10-CM | POA: Diagnosis not present

## 2023-02-23 ENCOUNTER — Telehealth: Payer: Self-pay

## 2023-02-23 DIAGNOSIS — Z992 Dependence on renal dialysis: Secondary | ICD-10-CM | POA: Diagnosis not present

## 2023-02-23 DIAGNOSIS — N189 Chronic kidney disease, unspecified: Secondary | ICD-10-CM | POA: Diagnosis not present

## 2023-02-23 DIAGNOSIS — E1122 Type 2 diabetes mellitus with diabetic chronic kidney disease: Secondary | ICD-10-CM | POA: Diagnosis not present

## 2023-02-23 DIAGNOSIS — N186 End stage renal disease: Secondary | ICD-10-CM | POA: Diagnosis not present

## 2023-02-23 DIAGNOSIS — N2581 Secondary hyperparathyroidism of renal origin: Secondary | ICD-10-CM | POA: Diagnosis not present

## 2023-02-23 DIAGNOSIS — D509 Iron deficiency anemia, unspecified: Secondary | ICD-10-CM | POA: Diagnosis not present

## 2023-02-23 NOTE — Progress Notes (Signed)
 Transition Care Management Unsuccessful Follow-up Telephone Call  Date of discharge and from where:  02/12/2023 [facility]  Attempts:  1st Attempt  Reason for unsuccessful TCM follow-up call:  No answer/busy  Aundra Espin [facility]  [facility], [facility] Resource Care Guide Direct Dial: [phone]  Website: [email or web address]

## 2023-02-24 DIAGNOSIS — E1129 Type 2 diabetes mellitus with other diabetic kidney complication: Secondary | ICD-10-CM | POA: Diagnosis not present

## 2023-02-24 DIAGNOSIS — Z992 Dependence on renal dialysis: Secondary | ICD-10-CM | POA: Diagnosis not present

## 2023-02-24 DIAGNOSIS — N186 End stage renal disease: Secondary | ICD-10-CM | POA: Diagnosis not present

## 2023-02-26 ENCOUNTER — Telehealth: Payer: Self-pay

## 2023-02-26 DIAGNOSIS — E1122 Type 2 diabetes mellitus with diabetic chronic kidney disease: Secondary | ICD-10-CM | POA: Diagnosis not present

## 2023-02-26 DIAGNOSIS — N2581 Secondary hyperparathyroidism of renal origin: Secondary | ICD-10-CM | POA: Diagnosis not present

## 2023-02-26 DIAGNOSIS — D631 Anemia in chronic kidney disease: Secondary | ICD-10-CM | POA: Diagnosis not present

## 2023-02-26 DIAGNOSIS — D509 Iron deficiency anemia, unspecified: Secondary | ICD-10-CM | POA: Diagnosis not present

## 2023-02-26 DIAGNOSIS — N186 End stage renal disease: Secondary | ICD-10-CM | POA: Diagnosis not present

## 2023-02-26 DIAGNOSIS — Z992 Dependence on renal dialysis: Secondary | ICD-10-CM | POA: Diagnosis not present

## 2023-02-26 DIAGNOSIS — N189 Chronic kidney disease, unspecified: Secondary | ICD-10-CM | POA: Diagnosis not present

## 2023-02-26 NOTE — Progress Notes (Signed)
 Transition Care Management Unsuccessful Follow-up Telephone Call  Date of discharge and from where:  02/12/2024 [facility]  Attempts:  2nd Attempt  Reason for unsuccessful TCM follow-up call:  Voice mail full  Delsy Etzkorn [facility]  [facility], [facility] Resource Care Guide Direct Dial: [phone]  Website: [email or web address]

## 2023-03-02 DIAGNOSIS — N2581 Secondary hyperparathyroidism of renal origin: Secondary | ICD-10-CM | POA: Diagnosis not present

## 2023-03-02 DIAGNOSIS — E1122 Type 2 diabetes mellitus with diabetic chronic kidney disease: Secondary | ICD-10-CM | POA: Diagnosis not present

## 2023-03-02 DIAGNOSIS — D509 Iron deficiency anemia, unspecified: Secondary | ICD-10-CM | POA: Diagnosis not present

## 2023-03-02 DIAGNOSIS — Z992 Dependence on renal dialysis: Secondary | ICD-10-CM | POA: Diagnosis not present

## 2023-03-02 DIAGNOSIS — N186 End stage renal disease: Secondary | ICD-10-CM | POA: Diagnosis not present

## 2023-03-02 DIAGNOSIS — N189 Chronic kidney disease, unspecified: Secondary | ICD-10-CM | POA: Diagnosis not present

## 2023-03-03 DIAGNOSIS — Z0001 Encounter for general adult medical examination with abnormal findings: Secondary | ICD-10-CM | POA: Diagnosis not present

## 2023-03-03 DIAGNOSIS — E559 Vitamin D deficiency, unspecified: Secondary | ICD-10-CM | POA: Diagnosis not present

## 2023-03-03 DIAGNOSIS — E1142 Type 2 diabetes mellitus with diabetic polyneuropathy: Secondary | ICD-10-CM | POA: Diagnosis not present

## 2023-03-03 DIAGNOSIS — I509 Heart failure, unspecified: Secondary | ICD-10-CM | POA: Diagnosis not present

## 2023-03-03 DIAGNOSIS — D6959 Other secondary thrombocytopenia: Secondary | ICD-10-CM | POA: Diagnosis not present

## 2023-03-03 DIAGNOSIS — I132 Hypertensive heart and chronic kidney disease with heart failure and with stage 5 chronic kidney disease, or end stage renal disease: Secondary | ICD-10-CM | POA: Diagnosis not present

## 2023-03-03 DIAGNOSIS — E441 Mild protein-calorie malnutrition: Secondary | ICD-10-CM | POA: Diagnosis not present

## 2023-03-03 DIAGNOSIS — N189 Chronic kidney disease, unspecified: Secondary | ICD-10-CM | POA: Diagnosis not present

## 2023-03-03 DIAGNOSIS — F330 Major depressive disorder, recurrent, mild: Secondary | ICD-10-CM | POA: Diagnosis not present

## 2023-03-03 DIAGNOSIS — E1122 Type 2 diabetes mellitus with diabetic chronic kidney disease: Secondary | ICD-10-CM | POA: Diagnosis not present

## 2023-03-03 DIAGNOSIS — E785 Hyperlipidemia, unspecified: Secondary | ICD-10-CM | POA: Diagnosis not present

## 2023-03-03 DIAGNOSIS — E11621 Type 2 diabetes mellitus with foot ulcer: Secondary | ICD-10-CM | POA: Diagnosis not present

## 2023-03-03 DIAGNOSIS — F0150 Vascular dementia without behavioral disturbance: Secondary | ICD-10-CM | POA: Diagnosis not present

## 2023-03-03 DIAGNOSIS — N186 End stage renal disease: Secondary | ICD-10-CM | POA: Diagnosis not present

## 2023-03-03 DIAGNOSIS — Z992 Dependence on renal dialysis: Secondary | ICD-10-CM | POA: Diagnosis not present

## 2023-03-03 DIAGNOSIS — N2581 Secondary hyperparathyroidism of renal origin: Secondary | ICD-10-CM | POA: Diagnosis not present

## 2023-03-03 DIAGNOSIS — D509 Iron deficiency anemia, unspecified: Secondary | ICD-10-CM | POA: Diagnosis not present

## 2023-03-05 ENCOUNTER — Other Ambulatory Visit: Payer: Self-pay

## 2023-03-05 DIAGNOSIS — N189 Chronic kidney disease, unspecified: Secondary | ICD-10-CM | POA: Diagnosis not present

## 2023-03-05 DIAGNOSIS — Z992 Dependence on renal dialysis: Secondary | ICD-10-CM | POA: Diagnosis not present

## 2023-03-05 DIAGNOSIS — D509 Iron deficiency anemia, unspecified: Secondary | ICD-10-CM | POA: Diagnosis not present

## 2023-03-05 DIAGNOSIS — E1122 Type 2 diabetes mellitus with diabetic chronic kidney disease: Secondary | ICD-10-CM | POA: Diagnosis not present

## 2023-03-05 DIAGNOSIS — N186 End stage renal disease: Secondary | ICD-10-CM | POA: Diagnosis not present

## 2023-03-05 DIAGNOSIS — Z1231 Encounter for screening mammogram for malignant neoplasm of breast: Secondary | ICD-10-CM

## 2023-03-05 DIAGNOSIS — N2581 Secondary hyperparathyroidism of renal origin: Secondary | ICD-10-CM | POA: Diagnosis not present

## 2023-03-08 DIAGNOSIS — N2581 Secondary hyperparathyroidism of renal origin: Secondary | ICD-10-CM | POA: Diagnosis not present

## 2023-03-08 DIAGNOSIS — N189 Chronic kidney disease, unspecified: Secondary | ICD-10-CM | POA: Diagnosis not present

## 2023-03-08 DIAGNOSIS — Z992 Dependence on renal dialysis: Secondary | ICD-10-CM | POA: Diagnosis not present

## 2023-03-08 DIAGNOSIS — E1122 Type 2 diabetes mellitus with diabetic chronic kidney disease: Secondary | ICD-10-CM | POA: Diagnosis not present

## 2023-03-08 DIAGNOSIS — D509 Iron deficiency anemia, unspecified: Secondary | ICD-10-CM | POA: Diagnosis not present

## 2023-03-08 DIAGNOSIS — N186 End stage renal disease: Secondary | ICD-10-CM | POA: Diagnosis not present

## 2023-03-09 ENCOUNTER — Encounter: Payer: Self-pay | Admitting: Orthopedic Surgery

## 2023-03-09 ENCOUNTER — Ambulatory Visit (INDEPENDENT_AMBULATORY_CARE_PROVIDER_SITE_OTHER): Payer: Medicare Other | Admitting: Orthopedic Surgery

## 2023-03-09 DIAGNOSIS — L97511 Non-pressure chronic ulcer of other part of right foot limited to breakdown of skin: Secondary | ICD-10-CM | POA: Diagnosis not present

## 2023-03-09 DIAGNOSIS — E11610 Type 2 diabetes mellitus with diabetic neuropathic arthropathy: Secondary | ICD-10-CM

## 2023-03-09 NOTE — Progress Notes (Signed)
 Office Visit Note   Patient: Bailey [name]           Date of Birth: 08-10-1950           MRN: [id number] Visit Date: 03/09/2023              Requested by: Waylan Almarie SAUNDERS, MD [address] [place],  [place] [address] PCP: Waylan Almarie SAUNDERS, MD  Chief Complaint  Patient presents with   Right Foot - Follow-up     And December with good circulation. HPI: Patient is a 73 year old woman who presents in follow-up for ulceration and Charcot collapse right foot.  Patient is status post arteriogram with Dr. Gretta  Assessment & Plan: Visit Diagnoses:  1. Non-pressure chronic ulcer of other part of right foot limited to breakdown of skin (HCC)   2. Charcot joint of foot due to diabetes (HCC)     Plan: Continue with protective shoe wear.  Follow-Up Instructions: Return in about 2 months (around 05/07/2023).   Ortho Exam  Patient is alert, oriented, no adenopathy, well-dressed, normal affect, normal respiratory effort. Examination patient has a ulcer plantar aspect of the right foot beneath the Charcot midfoot rocker-bottom deformity.  After informed consent a 10 blade knife was used to debride the skin and soft tissue back to healthy viable tissue.  The ulcer is 2 cm in diameter and 0.1 mm deep after debridement.  Imaging: No results found. No images are attached to the encounter.  Labs: Lab Results  Component Value Date   HGBA1C 4.8 07/11/2022   HGBA1C 5.5 08/20/2019   HGBA1C 10.2 (H) 05/06/2016   ESRSEDRATE 108 (H) 04/13/2010   REPTSTATUS 10/03/2022 FINAL 09/30/2022   GRAMSTAIN  02/11/2014    ABUNDANT WBC PRESENT,BOTH PMN AND MONONUCLEAR NO SQUAMOUS EPITHELIAL CELLS SEEN FEW GRAM POSITIVE COCCI IN PAIRS IN CLUSTERS Performed at [facility]    GRAMSTAIN  02/11/2014    ABUNDANT WBC PRESENT,BOTH PMN AND MONONUCLEAR NO SQUAMOUS EPITHELIAL CELLS SEEN FEW GRAM POSITIVE COCCI IN PAIRS IN CLUSTERS Performed at [facility]    CULT MULTIPLE SPECIES  PRESENT, SUGGEST RECOLLECTION (A) 09/30/2022   LABORGA STAPHYLOCOCCUS AUREUS 09/02/2019     Lab Results  Component Value Date   ALBUMIN 3.5 09/30/2022   ALBUMIN 3.1 (L) 07/14/2022   ALBUMIN 3.1 (L) 07/13/2022    Lab Results  Component Value Date   MG 2.2 07/11/2022   MG 1.7 09/02/2019   MG 1.8 08/20/2019   No results found for: VD25OH  No results found for: PREALBUMIN    Latest Ref Rng & Units 02/12/2023    5:38 PM 02/11/2023   11:40 AM 11/26/2022   10:24 AM  CBC EXTENDED  WBC 4.0 - 10.5 K/uL 6.5   5.6   RBC 3.87 - 5.11 MIL/uL 3.53   3.42   Hemoglobin 12.0 - 15.0 g/dL 88.6  87.7  88.9   HCT 36.0 - 46.0 % 36.1  36.0  33.8   Platelets 150 - 400 K/uL 92   81   NEUT# 1.7 - 7.7 K/uL 3.2   3.2   Lymph# 0.7 - 4.0 K/uL 2.3   1.8      There is no height or weight on file to calculate BMI.  Orders:  No orders of the defined types were placed in this encounter.  No orders of the defined types were placed in this encounter.    Procedures: No procedures performed  Clinical Data: No additional findings.  ROS:  All other systems negative, except as noted in the HPI. Review of Systems  Objective: Vital Signs: LMP  (LMP Unknown)   Specialty Comments:  No specialty comments available.  PMFS History: Patient Active Problem List   Diagnosis Date Noted   UTI (urinary tract infection) 10/01/2022   Acute metabolic encephalopathy 09/30/2022   HLD (hyperlipidemia) 09/30/2022   History of CVA (cerebrovascular accident) 09/30/2022   Essential hypertension 07/13/2022   Acute CVA (cerebrovascular accident) (HCC) 07/11/2022   Pyelonephritis 04/27/2021   Malnutrition of moderate degree 09/05/2019   Hypokalemia 09/02/2019   Fever of unknown origin 09/02/2019   ESRD on dialysis (HCC) 08/20/2019   Symptomatic anemia 08/20/2019   Thrombocytopenia (HCC) 08/20/2019   CKD (chronic kidney disease) 05/23/2019   Type II diabetes mellitus, uncontrolled 12/07/2017   History of  anemia due to chronic kidney disease 10/11/2017   Vitamin B 12 deficiency 06/09/2017   Great toe amputation status, right 09/04/2016   Osteomyelitis of toe of right foot (HCC)    Acute renal failure (ARF) (HCC) 05/10/2016   SBO (small bowel obstruction) (HCC) 05/08/2016   Diverticular disease of jejunum s/p resection 05/09/2016 05/08/2016   Normocytic anemia 05/05/2016   Past Medical History:  Diagnosis Date   Anemia    patient preference - stopped iron     ARF (acute renal failure) (HCC) 04/2016   dehydration   Chronic kidney disease    stage 5   DDD (degenerative disc disease), lumbar    Depression    Diabetes mellitus    Type II - pt preference - stopped lantus   Diabetic retinopathy (HCC)    Diverticulitis    History of kidney stones    passed- 4   Hypertension    Lacunar infarction (HCC)    Osteomyelitis (HCC)    Polyneuropathy    Vitamin B 12 deficiency 06/09/2017   Vitamin D deficiency    Wears partial dentures    upper    Family History  Problem Relation Age of Onset   Diabetes Mother    Asthma Sister     Past Surgical History:  Procedure Laterality Date   ABDOMINAL AORTOGRAM W/LOWER EXTREMITY Right 02/11/2023   Procedure: ABDOMINAL AORTOGRAM W/LOWER EXTREMITY;  Surgeon: Gretta Lonni PARAS, MD;  Location: [facility] INVASIVE [facility];  Service: Cardiovascular;  Laterality: Right;   AMPUTATION Right 08/14/2016   Procedure: RIGHT 1ST RAY AMPUTATION MID-SHAFT;  Surgeon: Harden Jerona GAILS, MD;  Location: [facility] OR;  Service: Orthopedics;  Laterality: Right;   BASCILIC VEIN TRANSPOSITION Left 06/08/2019   Procedure: LEFT BRACHIOCEPHALIC VEIN CREATION;  Surgeon: Gretta Lonni PARAS, MD;  Location: [facility] OR;  Service: Vascular;  Laterality: Left;   CATARACT EXTRACTION  2018   INCISION AND DRAINAGE ABSCESS Left 02/11/2014   Procedure: INCISION AND DRAINAGE ABSCESS Left Buttock;  Surgeon: Krystal Russell, MD;  Location: [facility] ORS;  Service: General;  Laterality: Left;   INSERTION OF  DIALYSIS CATHETER Left 09/07/2019   Procedure: INSERTION OF LEFT INTERNAL JUGULAR DIALYSIS CATHETER;  Surgeon: Oris Krystal FALCON, MD;  Location: [facility] OR;  Service: Vascular;  Laterality: Left;   IR REMOVAL TUN CV CATH W/O FL  09/04/2019   LAPAROSCOPIC SMALL BOWEL RESECTION N/A 05/09/2016   Procedure: LAPAROSCOPIC SMALL BOWEL RESECTION;  Surgeon: Elspeth Schultze, MD;  Location: [facility] ORS;  Service: General;  Laterality: N/A;   LAPAROSCOPY N/A 05/09/2016   Procedure: LAPAROSCOPY DIAGNOSTIC, LYSIS OF ADHESIONS, SMALL BOWEL RESECTION X 2;  Surgeon: Elspeth Schultze, MD;  Location: [facility] ORS;  Service: General;  Laterality: N/A;   TOE AMPUTATION     left foot great toe   Social History   Occupational History   Not on file  Tobacco Use   Smoking status: Never   Smokeless tobacco: Never  Vaping Use   Vaping status: Never Used  Substance and Sexual Activity   Alcohol  use: No    Alcohol /week: 0.0 standard drinks of alcohol    Drug use: No   Sexual activity: Not on file

## 2023-03-10 ENCOUNTER — Other Ambulatory Visit: Payer: Self-pay | Admitting: Nurse Practitioner

## 2023-03-10 DIAGNOSIS — D509 Iron deficiency anemia, unspecified: Secondary | ICD-10-CM | POA: Diagnosis not present

## 2023-03-10 DIAGNOSIS — Z992 Dependence on renal dialysis: Secondary | ICD-10-CM | POA: Diagnosis not present

## 2023-03-10 DIAGNOSIS — N2581 Secondary hyperparathyroidism of renal origin: Secondary | ICD-10-CM | POA: Diagnosis not present

## 2023-03-10 DIAGNOSIS — N189 Chronic kidney disease, unspecified: Secondary | ICD-10-CM | POA: Diagnosis not present

## 2023-03-10 DIAGNOSIS — M8580 Other specified disorders of bone density and structure, unspecified site: Secondary | ICD-10-CM

## 2023-03-10 DIAGNOSIS — E2839 Other primary ovarian failure: Secondary | ICD-10-CM

## 2023-03-10 DIAGNOSIS — E1122 Type 2 diabetes mellitus with diabetic chronic kidney disease: Secondary | ICD-10-CM | POA: Diagnosis not present

## 2023-03-10 DIAGNOSIS — N186 End stage renal disease: Secondary | ICD-10-CM | POA: Diagnosis not present

## 2023-03-12 DIAGNOSIS — Z992 Dependence on renal dialysis: Secondary | ICD-10-CM | POA: Diagnosis not present

## 2023-03-12 DIAGNOSIS — N186 End stage renal disease: Secondary | ICD-10-CM | POA: Diagnosis not present

## 2023-03-12 DIAGNOSIS — D509 Iron deficiency anemia, unspecified: Secondary | ICD-10-CM | POA: Diagnosis not present

## 2023-03-12 DIAGNOSIS — E1122 Type 2 diabetes mellitus with diabetic chronic kidney disease: Secondary | ICD-10-CM | POA: Diagnosis not present

## 2023-03-12 DIAGNOSIS — N2581 Secondary hyperparathyroidism of renal origin: Secondary | ICD-10-CM | POA: Diagnosis not present

## 2023-03-12 DIAGNOSIS — N189 Chronic kidney disease, unspecified: Secondary | ICD-10-CM | POA: Diagnosis not present

## 2023-03-15 DIAGNOSIS — Z992 Dependence on renal dialysis: Secondary | ICD-10-CM | POA: Diagnosis not present

## 2023-03-15 DIAGNOSIS — E1122 Type 2 diabetes mellitus with diabetic chronic kidney disease: Secondary | ICD-10-CM | POA: Diagnosis not present

## 2023-03-15 DIAGNOSIS — N2581 Secondary hyperparathyroidism of renal origin: Secondary | ICD-10-CM | POA: Diagnosis not present

## 2023-03-15 DIAGNOSIS — N186 End stage renal disease: Secondary | ICD-10-CM | POA: Diagnosis not present

## 2023-03-15 DIAGNOSIS — D509 Iron deficiency anemia, unspecified: Secondary | ICD-10-CM | POA: Diagnosis not present

## 2023-03-15 DIAGNOSIS — N189 Chronic kidney disease, unspecified: Secondary | ICD-10-CM | POA: Diagnosis not present

## 2023-03-15 IMAGING — CT CT L SPINE W/O CM
3 of 5 series · 11 of 35 positions shown, 12 images · non-contrast
Comparison: MRI lumbar spine 04/29/2021. CT Abdomen and Pelvis
05/10/2021.

CLINICAL DATA: 70-year-old female status post fall. Pain.



[Series 7: axial reformats bone · axial · 0.36mm/px · z∈[-689,-575]mm · 2 of 146 slices shown, 3 images]
[im 42/146  soft-tissue]
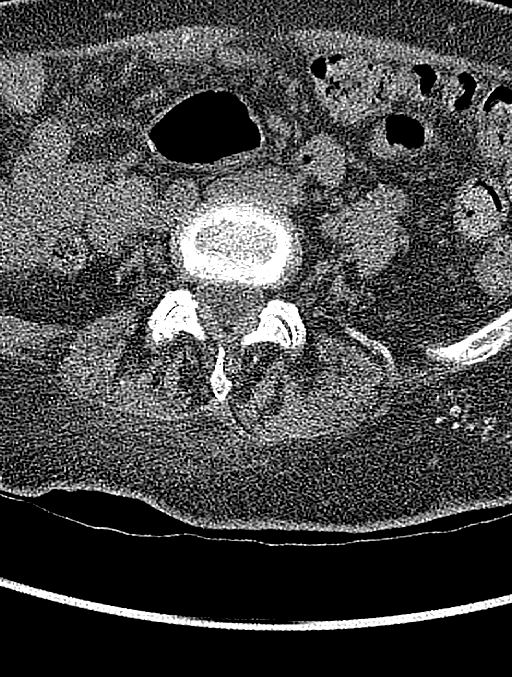
[im 42/146  bone]
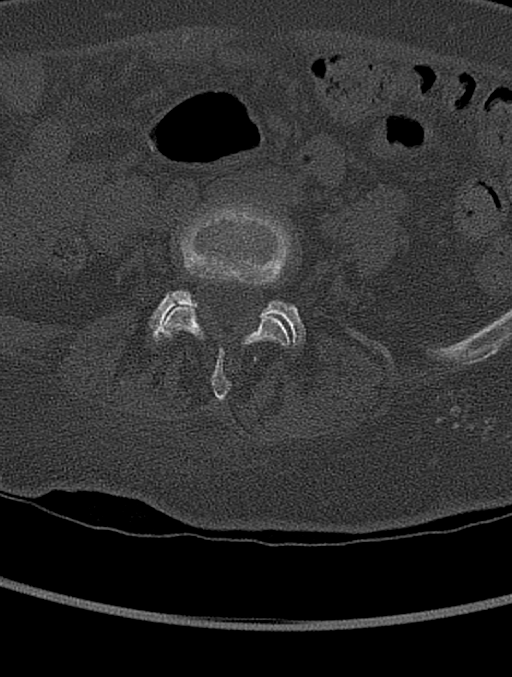
[im 104/146  bone]
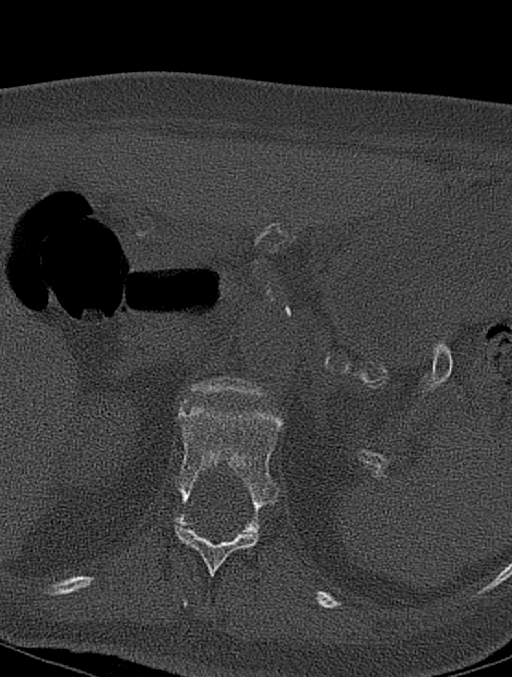

[Series 8: coronal bone · coronal · 0.42mm/px · 3 of 120 slices shown]
[im 24/120  bone]
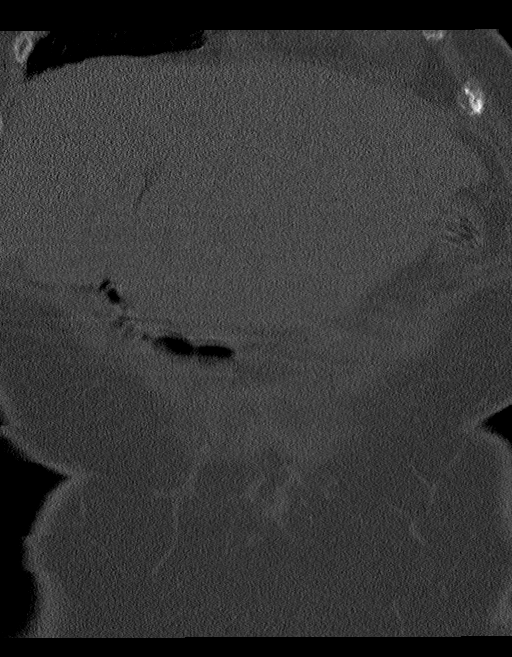
[im 48/120  bone]
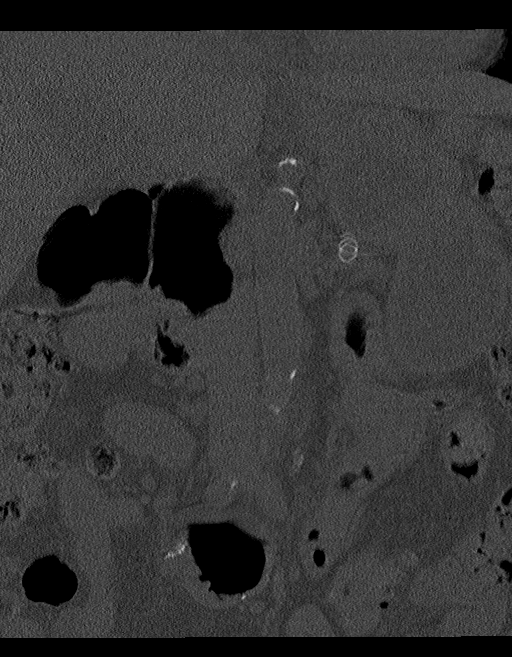
[im 72/120  bone]
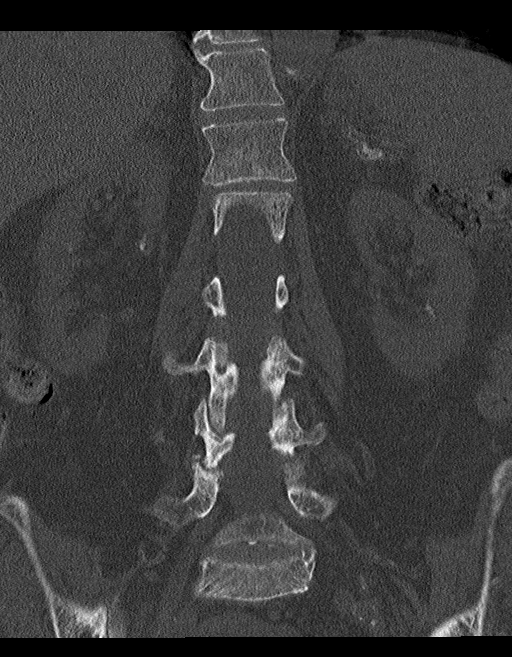

[Series 9: sagittal bone · sagittal · 0.37mm/px · 6 of 198 slices shown]
[im 33/198  bone]
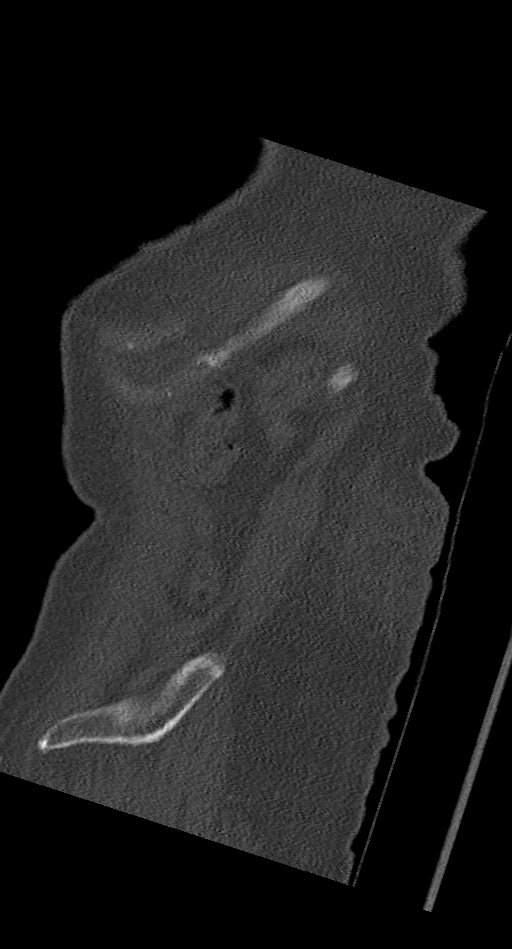
[im 66/198  bone]
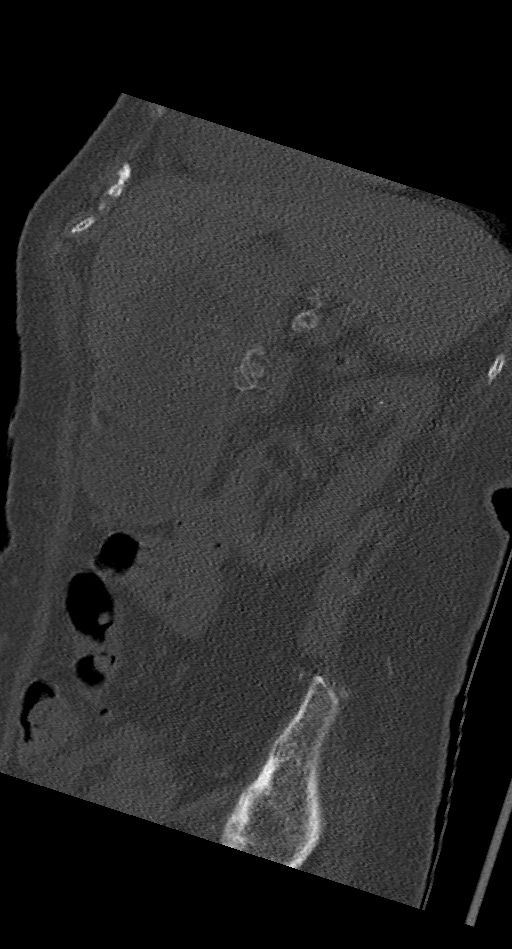
[im 99/198  bone]
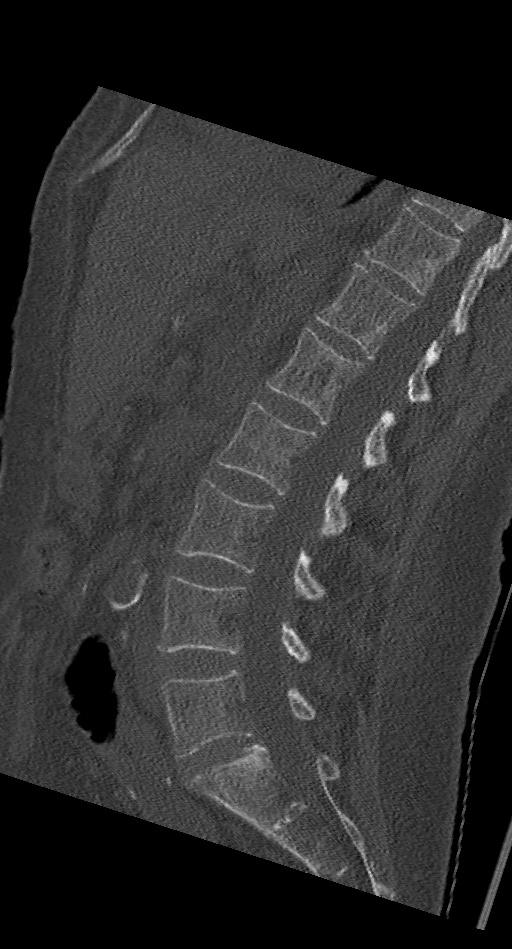
[im 132/198  bone]
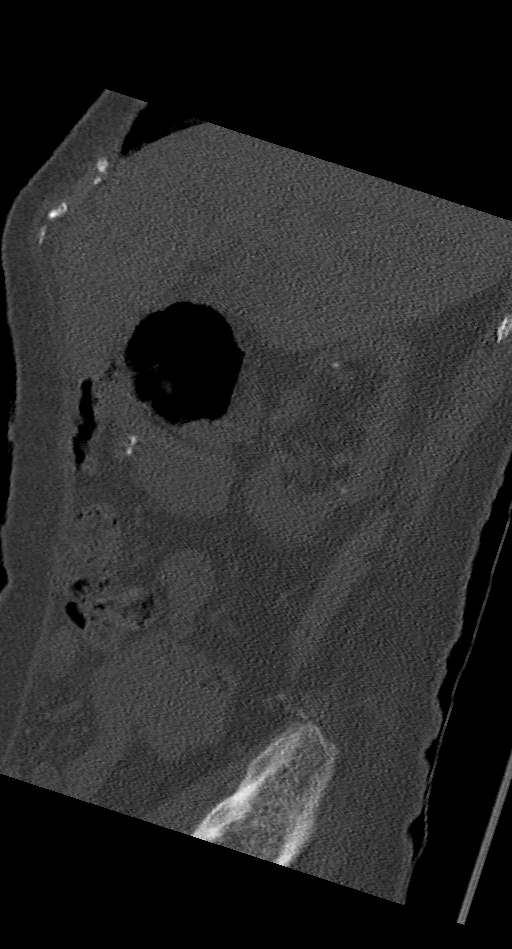
[im 165/198  bone]
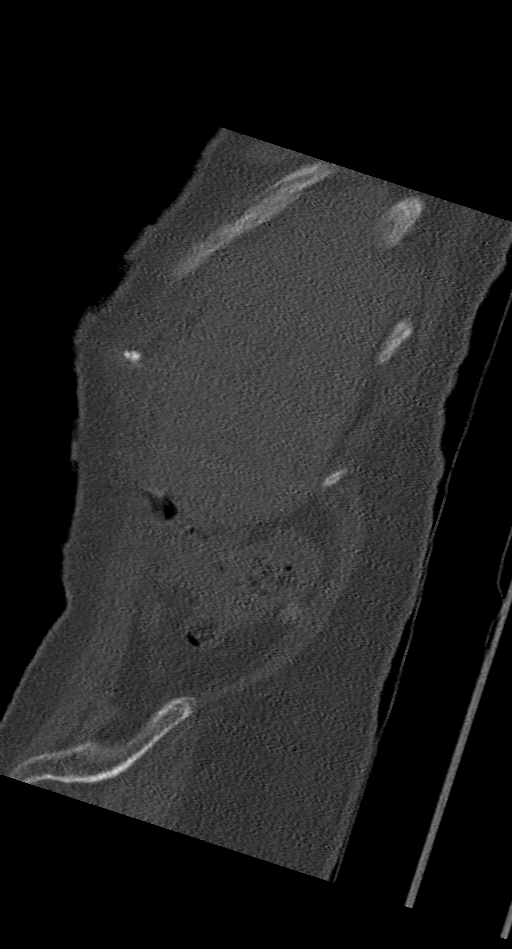
[im 184/198  soft-tissue]
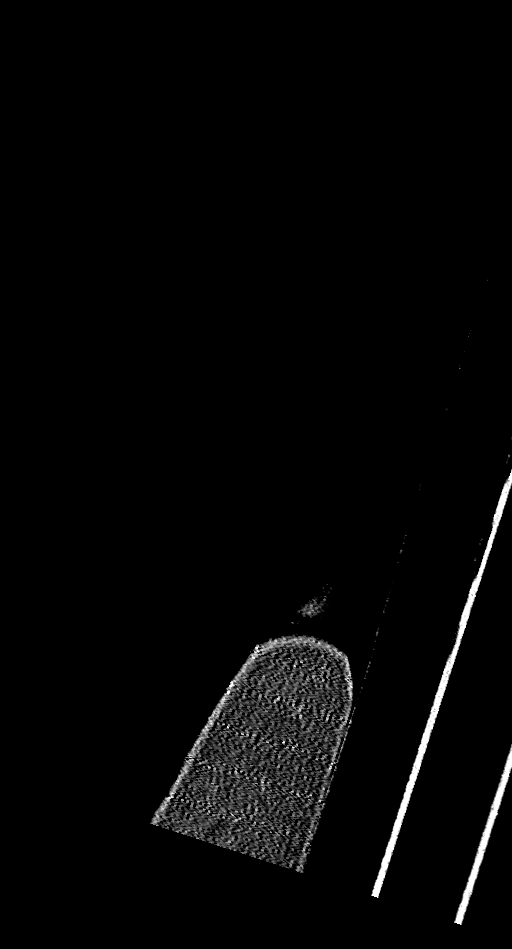

[11 of 35 positions shown; findings below may reference images not displayed]

FINDINGS: Segmentation: Normal, the same numbering system used on the [REDACTED]
MRI.

Alignment: Stable lumbar lordosis. No spondylolisthesis.

Vertebrae: Osteopenia. L1 benign vertebral body hemangioma. Stable
lumbar vertebral height and alignment. No acute osseous abnormality
identified. Visible sacrum and SI joints appear grossly intact.

Paraspinal and other soft tissues: Severe generalized calcified
atherosclerosis. Normal caliber abdominal aorta. Visible abdominal
viscera appear stable compared [REDACTED]. Lumbar paraspinal soft
tissues remain within normal limits.

Disc levels: Capacious lumbar spinal canal and mild for age lumbar
spine degeneration appear stable since the [REDACTED] MRI.
IMPRESSION: 1. Osteopenia. No acute osseous abnormality in the lumbar spine.
2. Capacious lumbar spinal canal and mild for age lumbar spine
degeneration appear stable since a [DATE]. Severe diffuse calcified atherosclerosis. Aortic Atherosclerosis
(2496D-7I6.6).

## 2023-03-15 IMAGING — CR DG HAND COMPLETE 3+V*R*
4 series · 4 of 4 positions shown · non-contrast
Comparison: None Available.

CLINICAL DATA: Pain after fall

EXAM:
RIGHT HAND - COMPLETE 3+ VIEW

[x hand pa right]
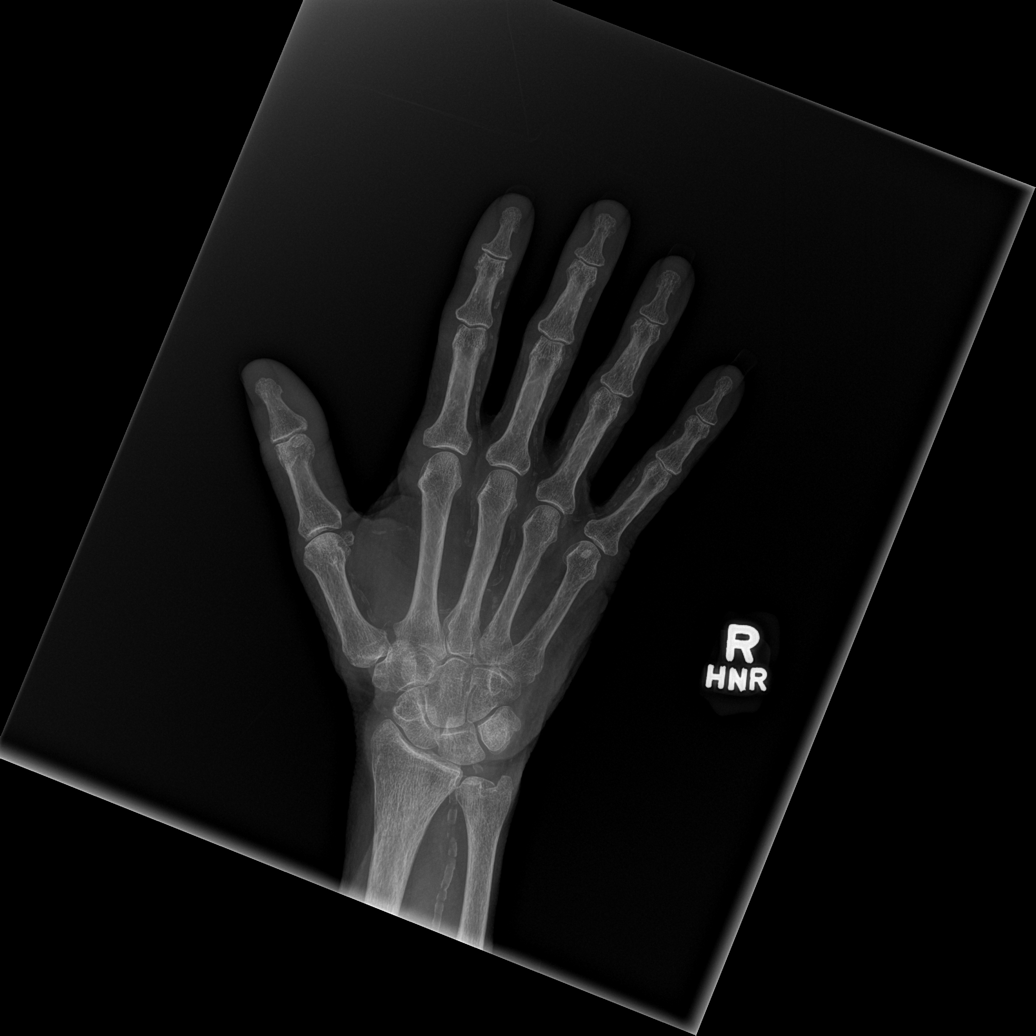

[x hand obl right]
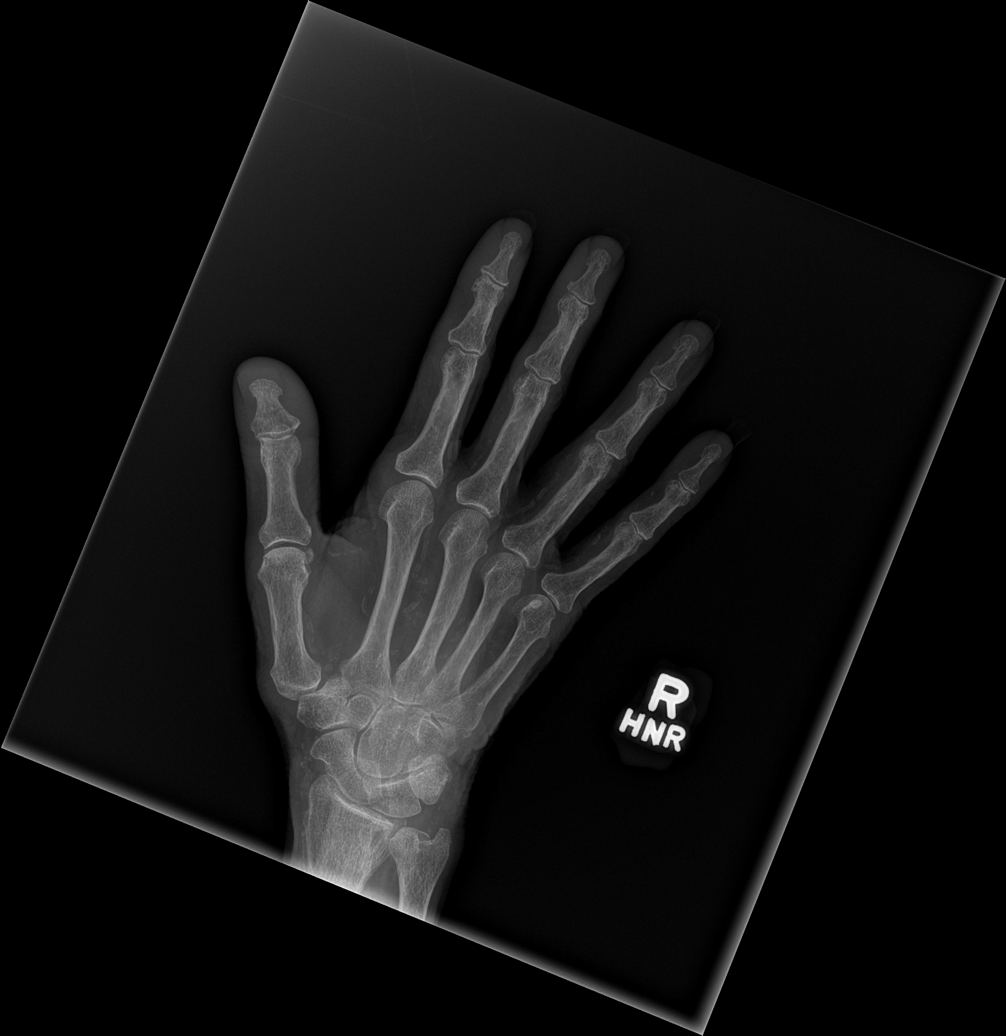

[x hand lat right (1 of 2)]
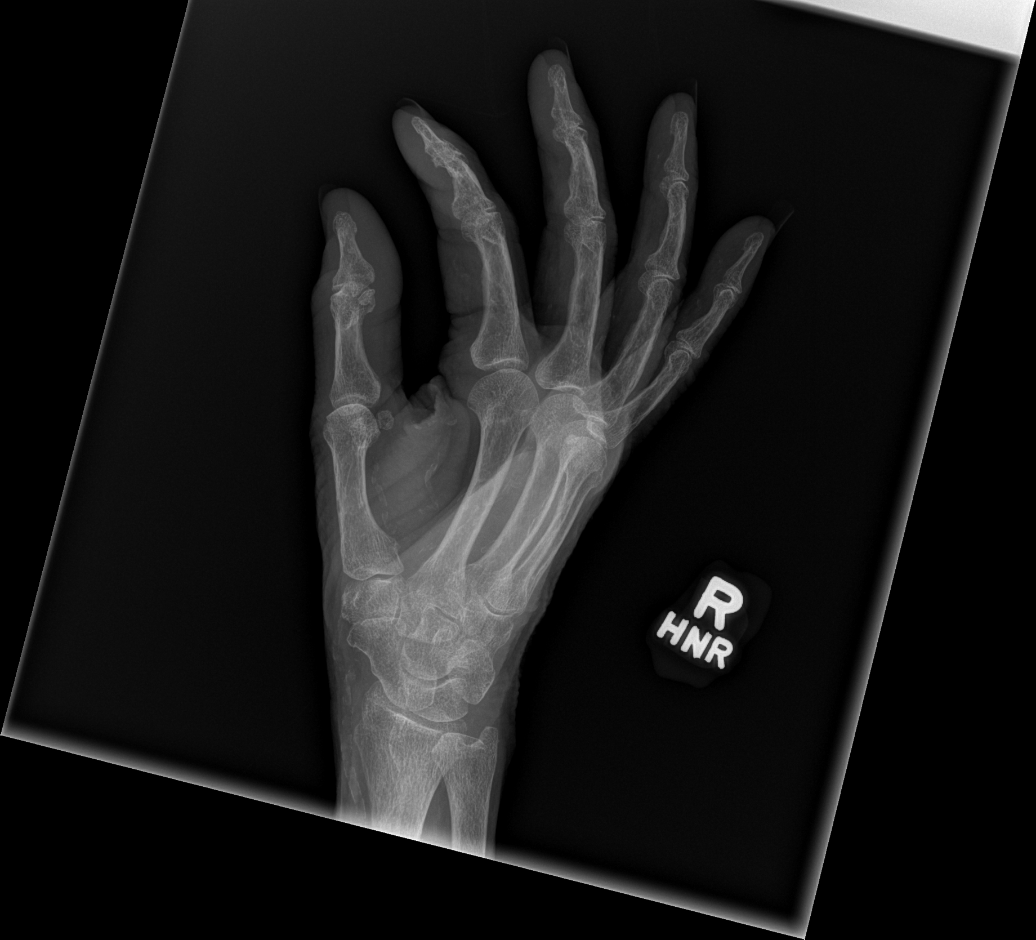

[x hand lat right (2 of 2)]
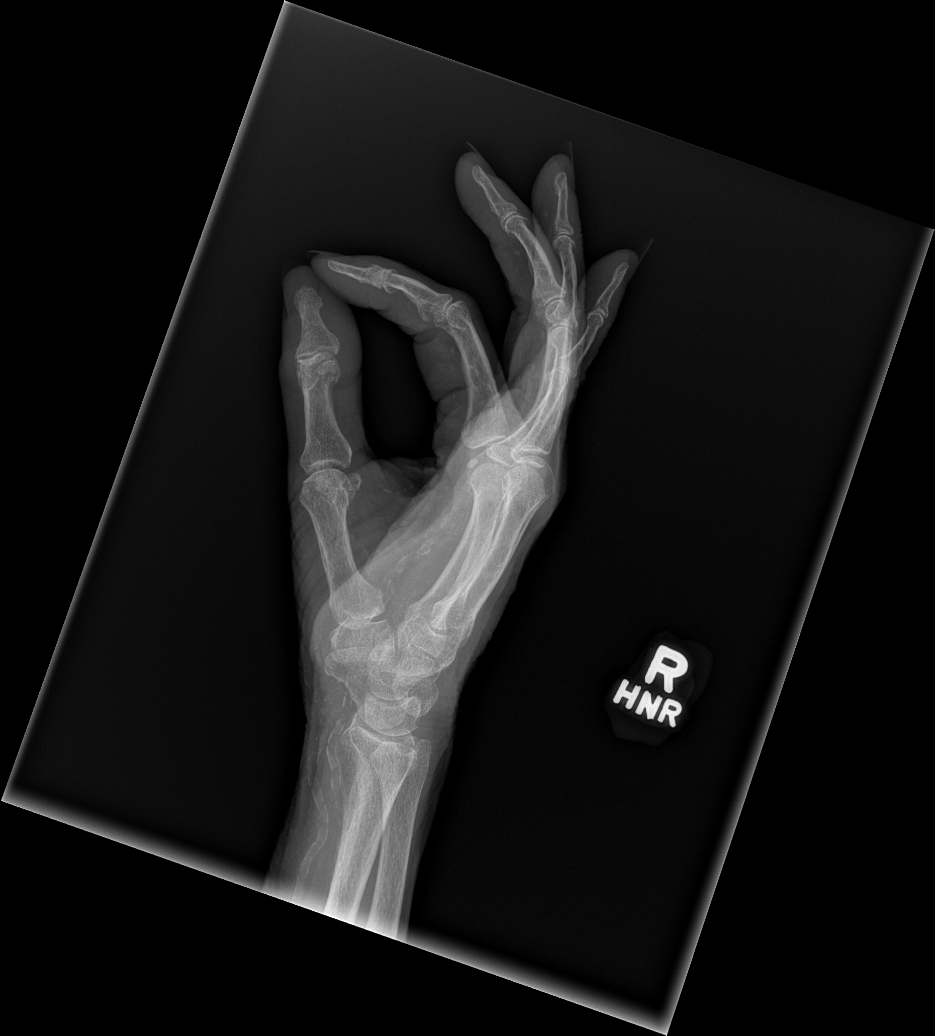

[4 of 4 positions shown; findings below may reference images not displayed]

FINDINGS: Vascular calcifications are identified. Nondisplaced fracture
through the distal first phalanx best seen on the oblique view. No
other fractures.
IMPRESSION: Nondisplaced fracture through the distal first phalanx.

## 2023-03-15 IMAGING — CT CT HEAD W/O CM
3 series · 15 of 47 positions shown, 18 images · non-contrast
Comparison: Head CT 04/13/2010.

CLINICAL DATA: 70-year-old female status post fall. Pain.



[Series 3: head wo · axial · 0.47mm/px · z∈[-186,-30]mm · 9 of 37 slices shown, 12 images]
[im 3/37  brain]
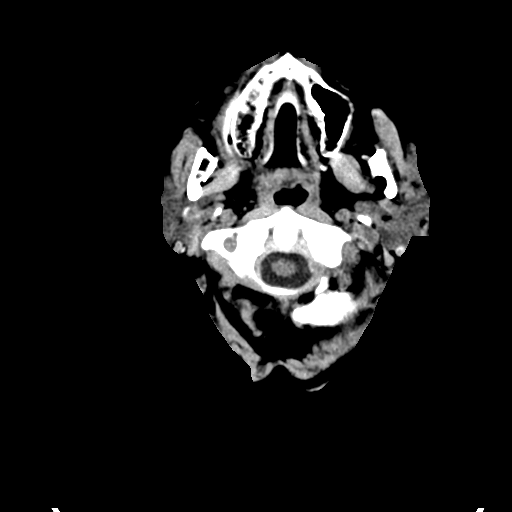
[im 3/37  bone]
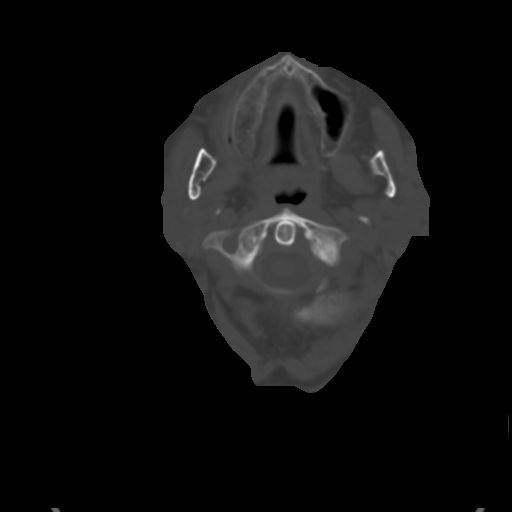
[im 7/37  brain]
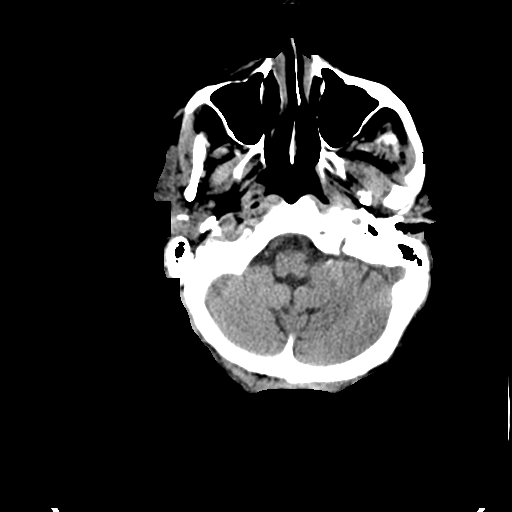
[im 10/37  brain]
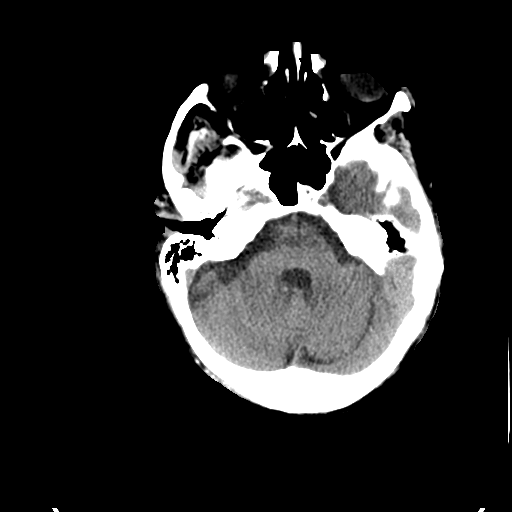
[im 14/37  brain]
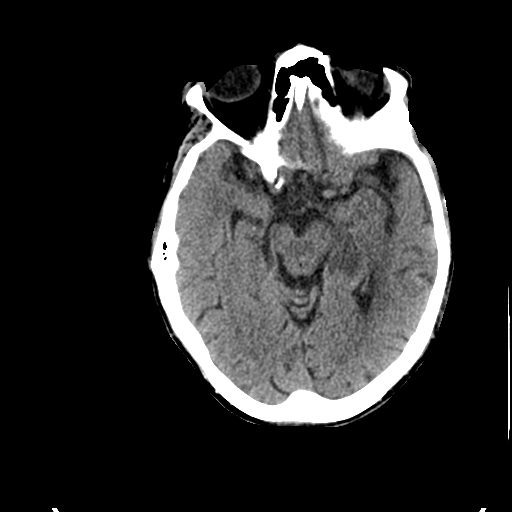
[im 19/37  brain]
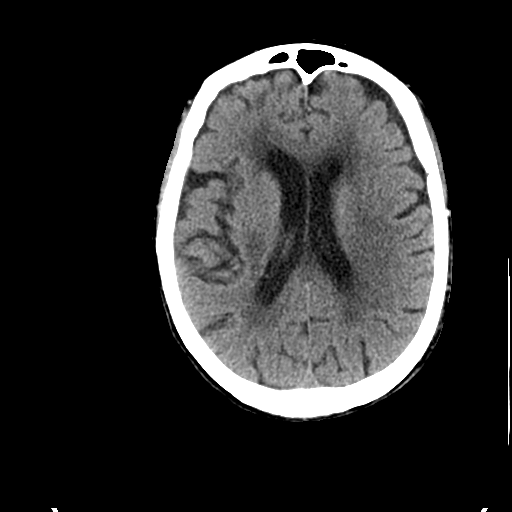
[im 19/37  bone]
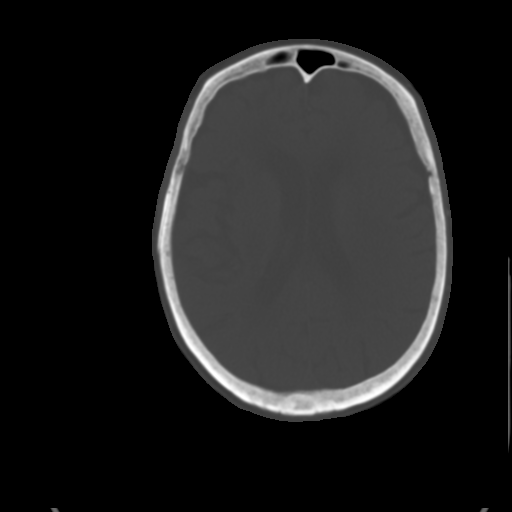
[im 23/37  brain]
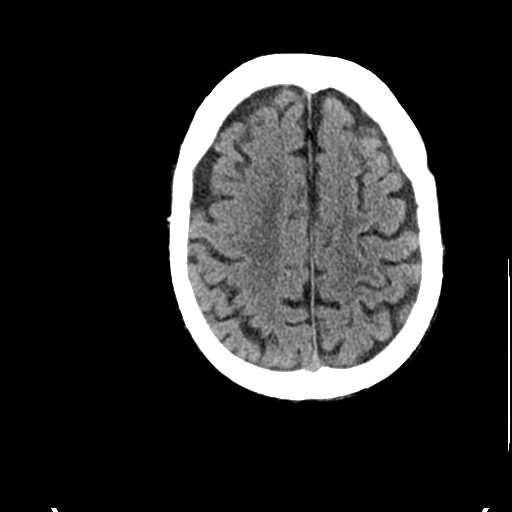
[im 27/37  brain]
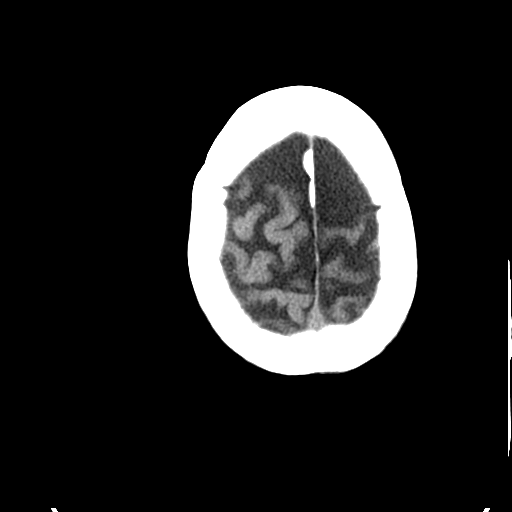
[im 30/37  brain]
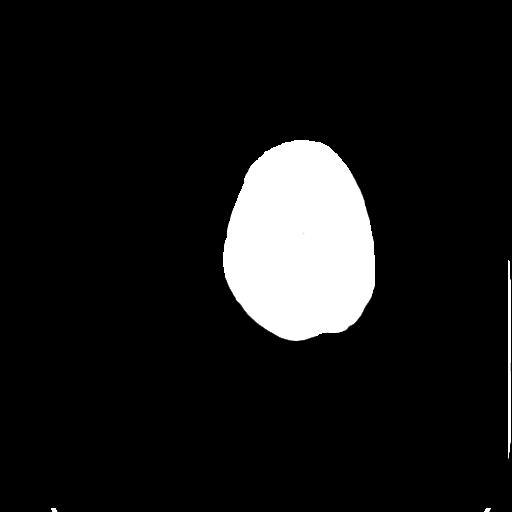
[im 34/37  brain]
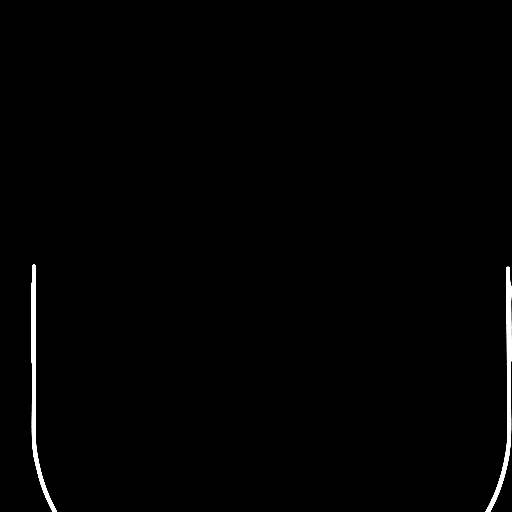
[im 34/37  bone]
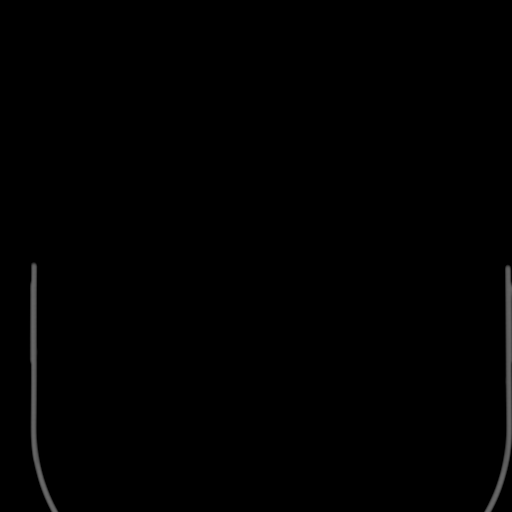

[Series 5: coronal soft tissue · coronal · 0.36mm/px · 3 of 69 slices shown]
[im 23/69  brain]
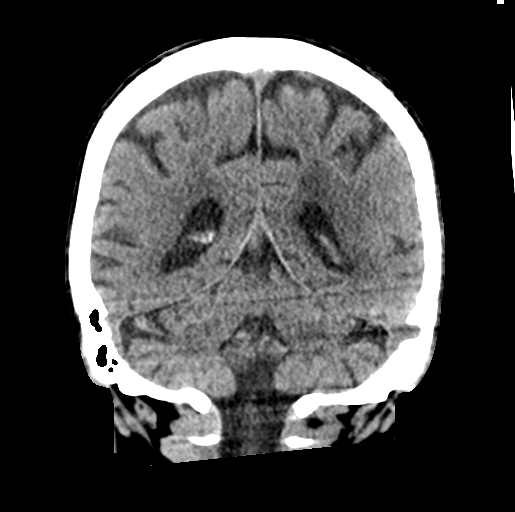
[im 31/69  brain]
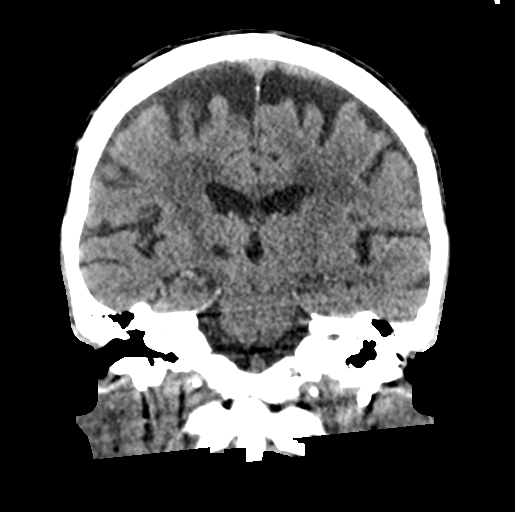
[im 38/69  brain]
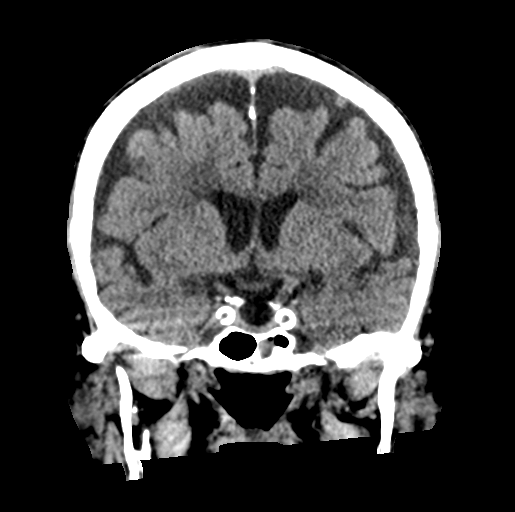

[Series 6: sagittal soft tissue · sagittal · 0.33mm/px · 3 of 56 slices shown]
[im 19/56  brain]
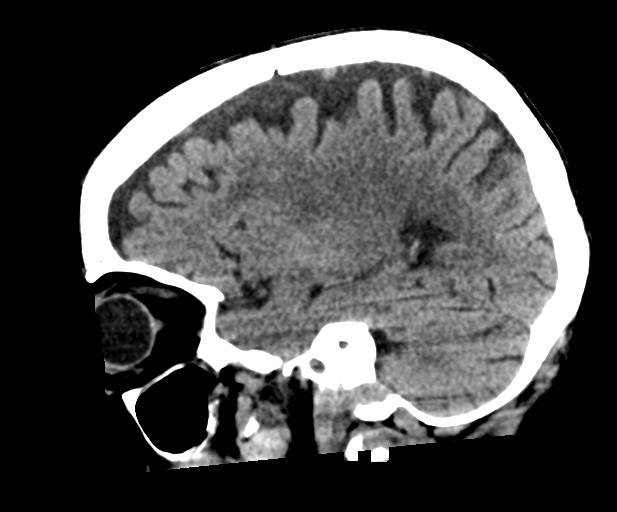
[im 28/56  brain]
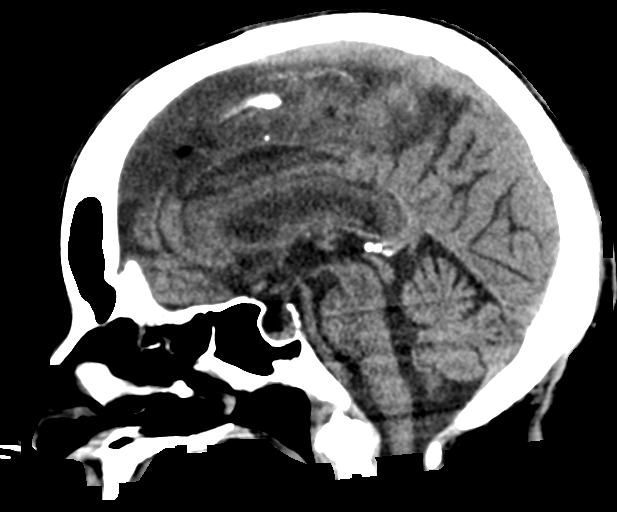
[im 37/56  brain]
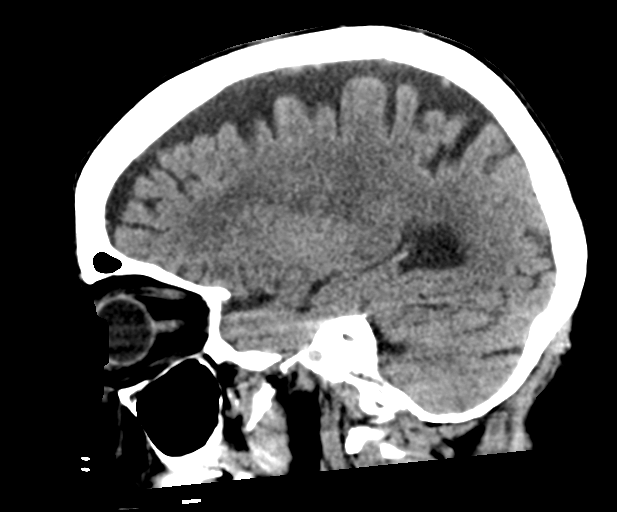

[15 of 47 positions shown; findings below may reference images not displayed]

FINDINGS: Brain: Generalized cerebral volume loss since 5975. Prominent
lacunar infarct in the right thalamus is new since that time, age
indeterminate but probably chronic (series 3, image 16). Other
patchy bilateral white matter hypodensity has also progressed.

No midline shift, ventriculomegaly, mass effect, evidence of mass
lesion, intracranial hemorrhage or evidence of cortically based
acute infarction.

Vascular: Extensive Calcified atherosclerosis at the skull base. No
suspicious intracranial vascular hyperdensity.

Skull: No acute osseous abnormality identified.

Sinuses/Orbits: Visualized paranasal sinuses and mastoids are stable
and well aerated.

Other: No acute orbit or scalp soft tissue injury identified. Some
scalp vessel calcified atherosclerosis.
IMPRESSION: 1. No acute traumatic injury identified.
2. Progressed cerebral volume loss and small vessel disease since
5975. A right thalamic lacunar infarct is age indeterminate but
probably chronic.

## 2023-03-15 IMAGING — CT CT CERVICAL SPINE W/O CM
3 of 4 series · 12 of 35 positions shown, 14 images · non-contrast
Comparison: Head CT today.

CLINICAL DATA: 70-year-old female status post fall. Pain.



[Series 5: orthogonal bone · axial · 0.48mm/px · z∈[-285,-127]mm · 4 of 114 slices shown, 5 images]
[im 17/114  soft-tissue]
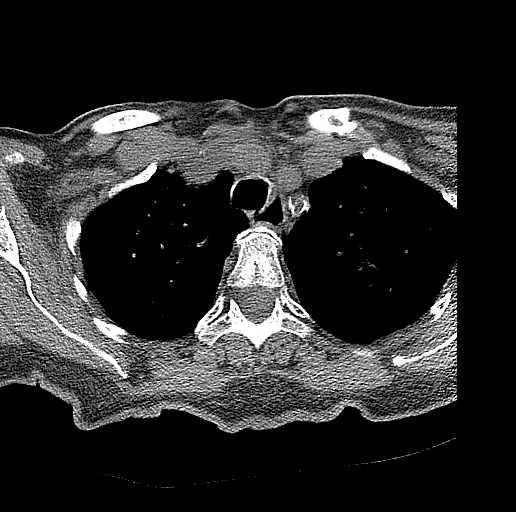
[im 17/114  bone]
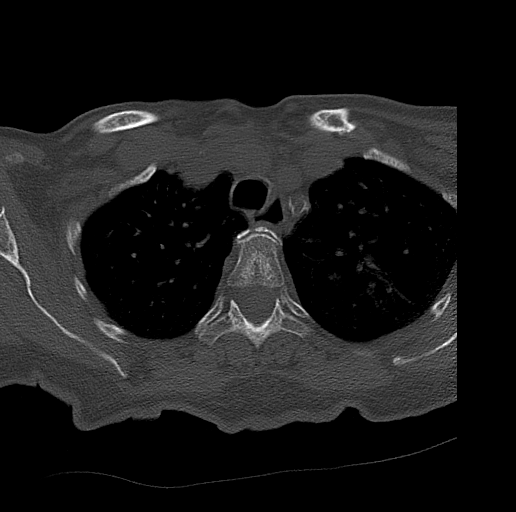
[im 49/114  bone]
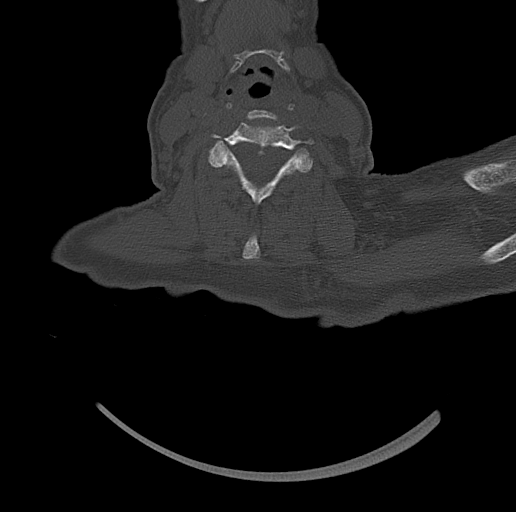
[im 65/114  bone]
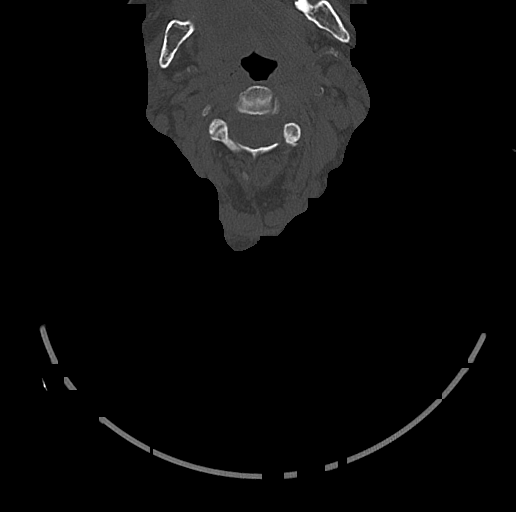
[im 97/114  bone]
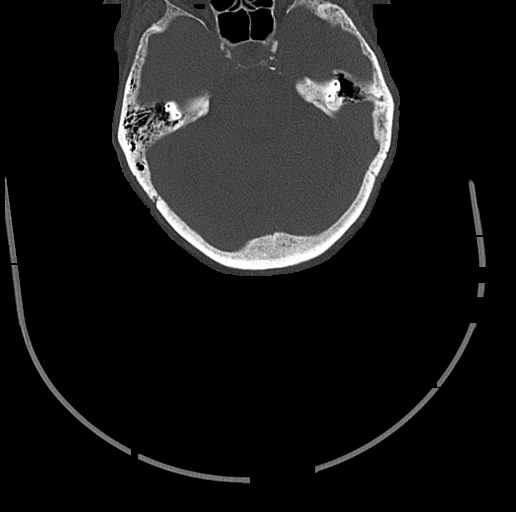

[Series 6: coronal bone · coronal · 0.45mm/px · 3 of 145 slices shown]
[im 58/145  bone]
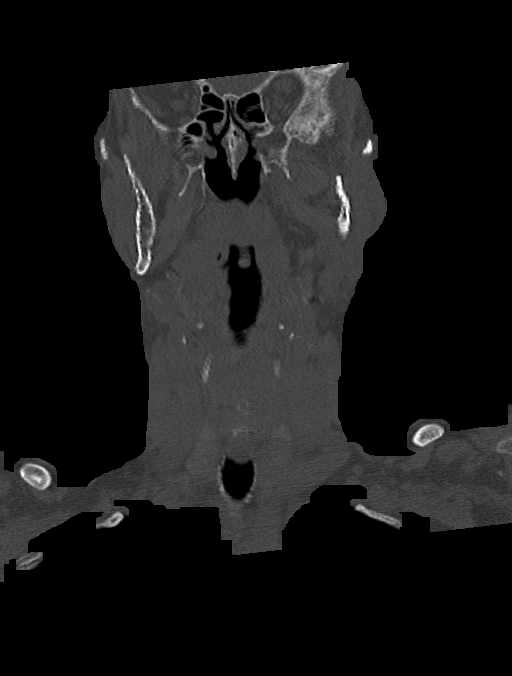
[im 68/145  bone]
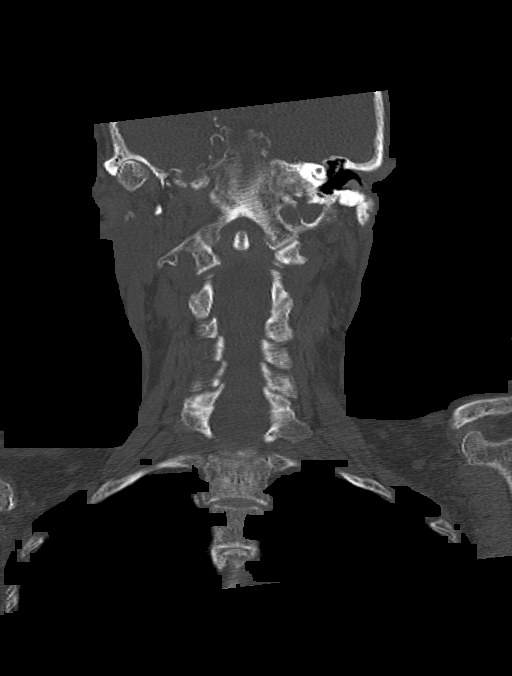
[im 78/145  bone]
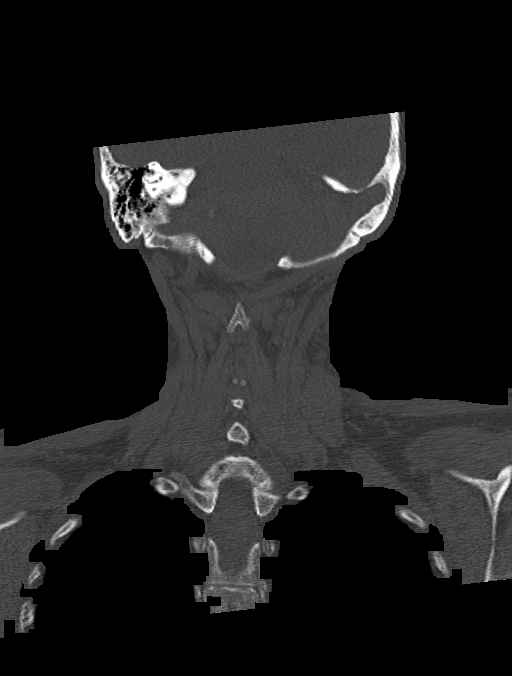

[Series 7: sagittal bone · sagittal · 0.42mm/px · 5 of 187 slices shown, 6 images]
[im 63/187  bone]
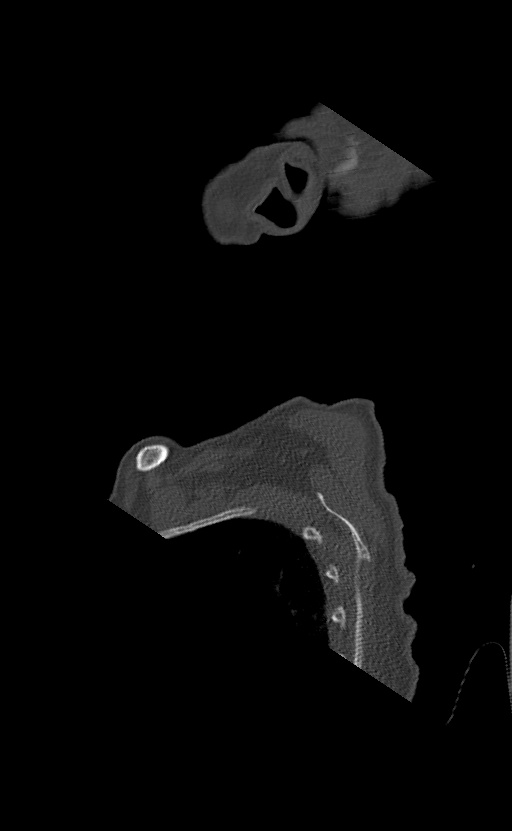
[im 78/187  bone]
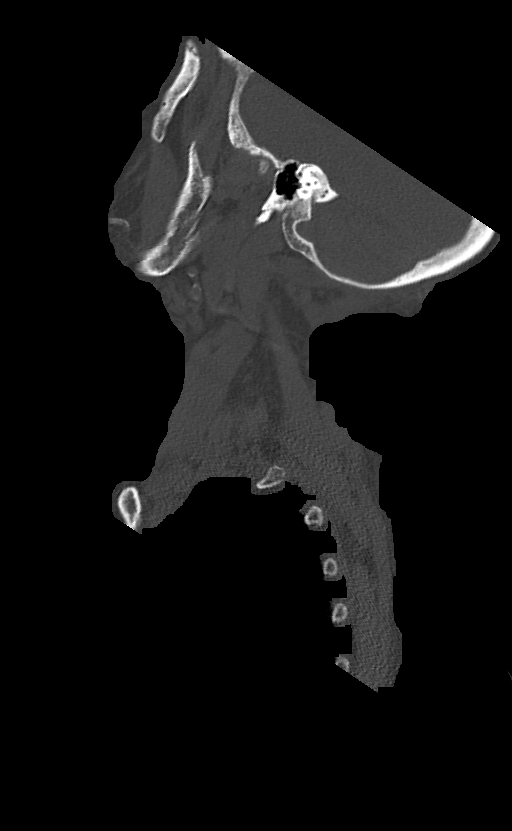
[im 94/187  soft-tissue]
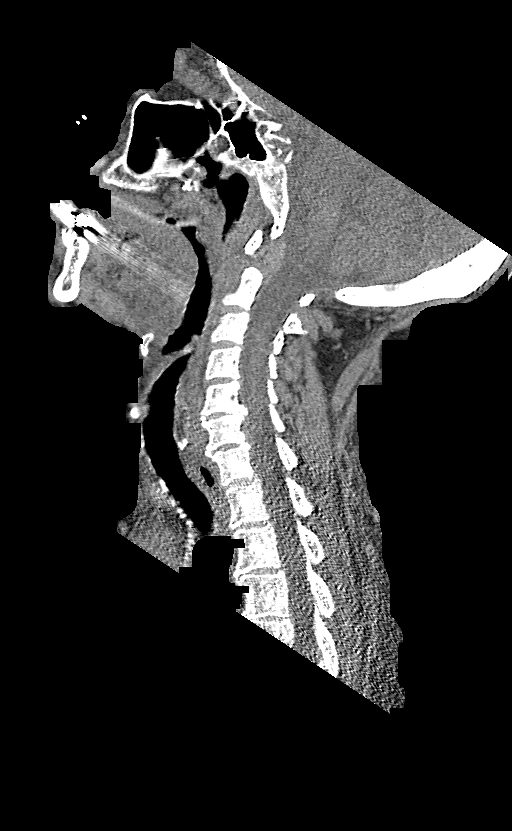
[im 94/187  bone]
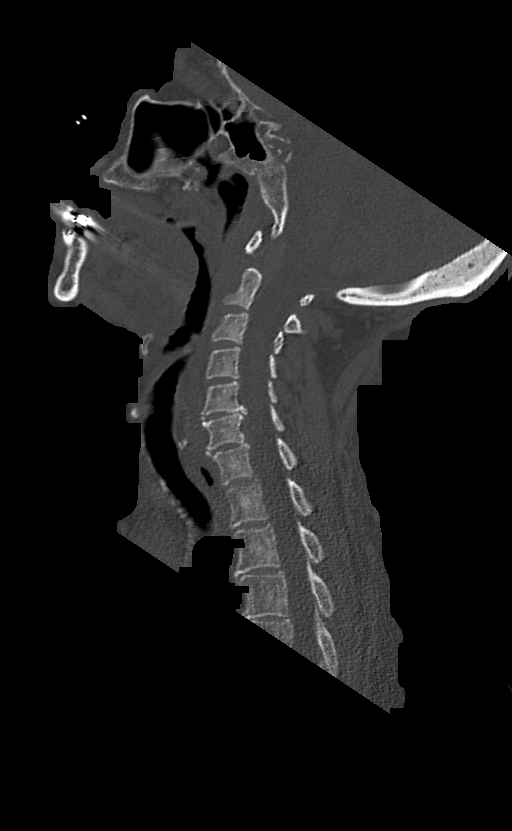
[im 109/187  bone]
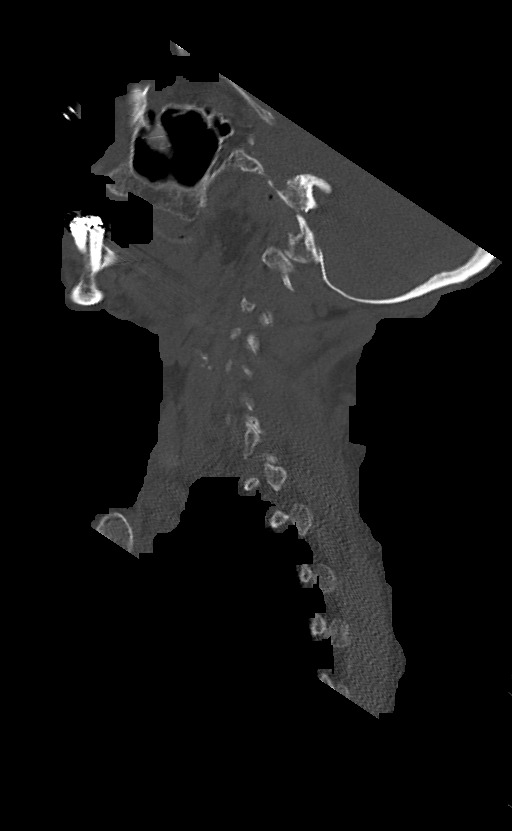
[im 125/187  bone]
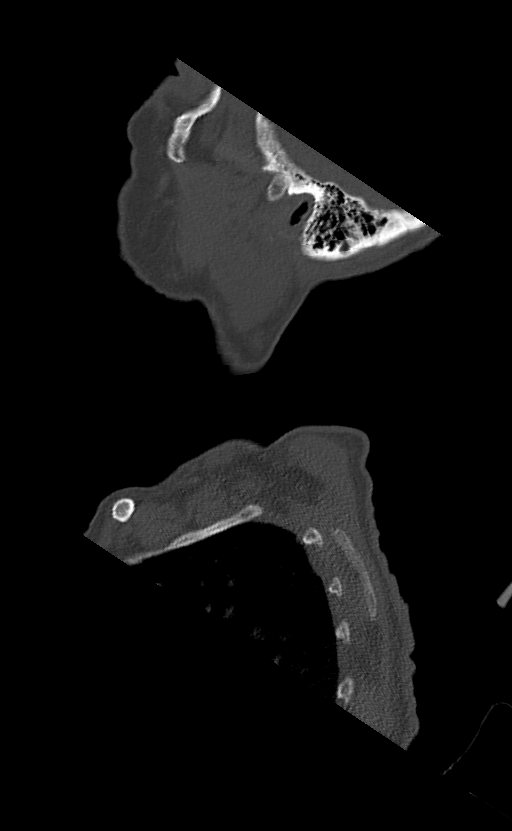

[12 of 35 positions shown; findings below may reference images not displayed]

FINDINGS: Alignment: Preserved cervical lordosis. Cervicothoracic junction
alignment is within normal limits. Bilateral posterior element
alignment is within normal limits.

Skull base and vertebrae: Visualized skull base is intact. No
atlanto-occipital dissociation. C1 and C2 appear intact and aligned.
Generalized osteopenia. No acute osseous abnormality identified.

Soft tissues and spinal canal: No prevertebral fluid or swelling. No
visible canal hematoma. Fairly extensive calcified atherosclerosis
in the neck.

Disc levels: Fairly capacious cervical spinal canal despite chronic
disc and endplate degeneration at C5-C6 and C6-C7.

Upper chest: Visible upper thoracic levels appear grossly intact.
Mild respiratory motion but negative lung apices.

Other: Carious posterior mandible dentition.
IMPRESSION: 1. No acute traumatic injury identified in the cervical spine.
2. Osteopenia. C5-C6 and C6-C7 disc and endplate degeneration.
3. Carious posterior mandible dentition.

## 2023-03-17 DIAGNOSIS — Z992 Dependence on renal dialysis: Secondary | ICD-10-CM | POA: Diagnosis not present

## 2023-03-17 DIAGNOSIS — N186 End stage renal disease: Secondary | ICD-10-CM | POA: Diagnosis not present

## 2023-03-17 DIAGNOSIS — N2581 Secondary hyperparathyroidism of renal origin: Secondary | ICD-10-CM | POA: Diagnosis not present

## 2023-03-17 DIAGNOSIS — N189 Chronic kidney disease, unspecified: Secondary | ICD-10-CM | POA: Diagnosis not present

## 2023-03-17 DIAGNOSIS — D509 Iron deficiency anemia, unspecified: Secondary | ICD-10-CM | POA: Diagnosis not present

## 2023-03-17 DIAGNOSIS — E1122 Type 2 diabetes mellitus with diabetic chronic kidney disease: Secondary | ICD-10-CM | POA: Diagnosis not present

## 2023-03-19 DIAGNOSIS — D509 Iron deficiency anemia, unspecified: Secondary | ICD-10-CM | POA: Diagnosis not present

## 2023-03-19 DIAGNOSIS — N2581 Secondary hyperparathyroidism of renal origin: Secondary | ICD-10-CM | POA: Diagnosis not present

## 2023-03-19 DIAGNOSIS — N186 End stage renal disease: Secondary | ICD-10-CM | POA: Diagnosis not present

## 2023-03-19 DIAGNOSIS — Z992 Dependence on renal dialysis: Secondary | ICD-10-CM | POA: Diagnosis not present

## 2023-03-19 DIAGNOSIS — N189 Chronic kidney disease, unspecified: Secondary | ICD-10-CM | POA: Diagnosis not present

## 2023-03-19 DIAGNOSIS — E1122 Type 2 diabetes mellitus with diabetic chronic kidney disease: Secondary | ICD-10-CM | POA: Diagnosis not present

## 2023-03-22 DIAGNOSIS — N186 End stage renal disease: Secondary | ICD-10-CM | POA: Diagnosis not present

## 2023-03-22 DIAGNOSIS — N189 Chronic kidney disease, unspecified: Secondary | ICD-10-CM | POA: Diagnosis not present

## 2023-03-22 DIAGNOSIS — Z992 Dependence on renal dialysis: Secondary | ICD-10-CM | POA: Diagnosis not present

## 2023-03-22 DIAGNOSIS — N2581 Secondary hyperparathyroidism of renal origin: Secondary | ICD-10-CM | POA: Diagnosis not present

## 2023-03-22 DIAGNOSIS — D509 Iron deficiency anemia, unspecified: Secondary | ICD-10-CM | POA: Diagnosis not present

## 2023-03-22 DIAGNOSIS — E1122 Type 2 diabetes mellitus with diabetic chronic kidney disease: Secondary | ICD-10-CM | POA: Diagnosis not present

## 2023-03-24 DIAGNOSIS — N189 Chronic kidney disease, unspecified: Secondary | ICD-10-CM | POA: Diagnosis not present

## 2023-03-24 DIAGNOSIS — N186 End stage renal disease: Secondary | ICD-10-CM | POA: Diagnosis not present

## 2023-03-24 DIAGNOSIS — E1122 Type 2 diabetes mellitus with diabetic chronic kidney disease: Secondary | ICD-10-CM | POA: Diagnosis not present

## 2023-03-24 DIAGNOSIS — N2581 Secondary hyperparathyroidism of renal origin: Secondary | ICD-10-CM | POA: Diagnosis not present

## 2023-03-24 DIAGNOSIS — D509 Iron deficiency anemia, unspecified: Secondary | ICD-10-CM | POA: Diagnosis not present

## 2023-03-24 DIAGNOSIS — Z992 Dependence on renal dialysis: Secondary | ICD-10-CM | POA: Diagnosis not present

## 2023-03-26 DIAGNOSIS — D509 Iron deficiency anemia, unspecified: Secondary | ICD-10-CM | POA: Diagnosis not present

## 2023-03-26 DIAGNOSIS — N186 End stage renal disease: Secondary | ICD-10-CM | POA: Diagnosis not present

## 2023-03-26 DIAGNOSIS — N2581 Secondary hyperparathyroidism of renal origin: Secondary | ICD-10-CM | POA: Diagnosis not present

## 2023-03-26 DIAGNOSIS — Z992 Dependence on renal dialysis: Secondary | ICD-10-CM | POA: Diagnosis not present

## 2023-03-26 DIAGNOSIS — E1122 Type 2 diabetes mellitus with diabetic chronic kidney disease: Secondary | ICD-10-CM | POA: Diagnosis not present

## 2023-03-26 DIAGNOSIS — N189 Chronic kidney disease, unspecified: Secondary | ICD-10-CM | POA: Diagnosis not present

## 2023-03-27 DIAGNOSIS — N186 End stage renal disease: Secondary | ICD-10-CM | POA: Diagnosis not present

## 2023-03-27 DIAGNOSIS — E1129 Type 2 diabetes mellitus with other diabetic kidney complication: Secondary | ICD-10-CM | POA: Diagnosis not present

## 2023-03-27 DIAGNOSIS — Z992 Dependence on renal dialysis: Secondary | ICD-10-CM | POA: Diagnosis not present

## 2023-03-29 DIAGNOSIS — D509 Iron deficiency anemia, unspecified: Secondary | ICD-10-CM | POA: Diagnosis not present

## 2023-03-29 DIAGNOSIS — N186 End stage renal disease: Secondary | ICD-10-CM | POA: Diagnosis not present

## 2023-03-29 DIAGNOSIS — Z992 Dependence on renal dialysis: Secondary | ICD-10-CM | POA: Diagnosis not present

## 2023-03-29 DIAGNOSIS — N2581 Secondary hyperparathyroidism of renal origin: Secondary | ICD-10-CM | POA: Diagnosis not present

## 2023-03-29 DIAGNOSIS — N189 Chronic kidney disease, unspecified: Secondary | ICD-10-CM | POA: Diagnosis not present

## 2023-03-29 DIAGNOSIS — D631 Anemia in chronic kidney disease: Secondary | ICD-10-CM | POA: Diagnosis not present

## 2023-03-29 DIAGNOSIS — E1122 Type 2 diabetes mellitus with diabetic chronic kidney disease: Secondary | ICD-10-CM | POA: Diagnosis not present

## 2023-03-31 DIAGNOSIS — N2581 Secondary hyperparathyroidism of renal origin: Secondary | ICD-10-CM | POA: Diagnosis not present

## 2023-03-31 DIAGNOSIS — D631 Anemia in chronic kidney disease: Secondary | ICD-10-CM | POA: Diagnosis not present

## 2023-03-31 DIAGNOSIS — N186 End stage renal disease: Secondary | ICD-10-CM | POA: Diagnosis not present

## 2023-03-31 DIAGNOSIS — D509 Iron deficiency anemia, unspecified: Secondary | ICD-10-CM | POA: Diagnosis not present

## 2023-03-31 DIAGNOSIS — N189 Chronic kidney disease, unspecified: Secondary | ICD-10-CM | POA: Diagnosis not present

## 2023-03-31 DIAGNOSIS — Z992 Dependence on renal dialysis: Secondary | ICD-10-CM | POA: Diagnosis not present

## 2023-04-02 DIAGNOSIS — N189 Chronic kidney disease, unspecified: Secondary | ICD-10-CM | POA: Diagnosis not present

## 2023-04-02 DIAGNOSIS — D509 Iron deficiency anemia, unspecified: Secondary | ICD-10-CM | POA: Diagnosis not present

## 2023-04-02 DIAGNOSIS — Z992 Dependence on renal dialysis: Secondary | ICD-10-CM | POA: Diagnosis not present

## 2023-04-02 DIAGNOSIS — N2581 Secondary hyperparathyroidism of renal origin: Secondary | ICD-10-CM | POA: Diagnosis not present

## 2023-04-02 DIAGNOSIS — D631 Anemia in chronic kidney disease: Secondary | ICD-10-CM | POA: Diagnosis not present

## 2023-04-02 DIAGNOSIS — N186 End stage renal disease: Secondary | ICD-10-CM | POA: Diagnosis not present

## 2023-04-05 DIAGNOSIS — Z992 Dependence on renal dialysis: Secondary | ICD-10-CM | POA: Diagnosis not present

## 2023-04-05 DIAGNOSIS — N2581 Secondary hyperparathyroidism of renal origin: Secondary | ICD-10-CM | POA: Diagnosis not present

## 2023-04-05 DIAGNOSIS — D509 Iron deficiency anemia, unspecified: Secondary | ICD-10-CM | POA: Diagnosis not present

## 2023-04-05 DIAGNOSIS — N189 Chronic kidney disease, unspecified: Secondary | ICD-10-CM | POA: Diagnosis not present

## 2023-04-05 DIAGNOSIS — D631 Anemia in chronic kidney disease: Secondary | ICD-10-CM | POA: Diagnosis not present

## 2023-04-05 DIAGNOSIS — N186 End stage renal disease: Secondary | ICD-10-CM | POA: Diagnosis not present

## 2023-04-07 DIAGNOSIS — D509 Iron deficiency anemia, unspecified: Secondary | ICD-10-CM | POA: Diagnosis not present

## 2023-04-07 DIAGNOSIS — D631 Anemia in chronic kidney disease: Secondary | ICD-10-CM | POA: Diagnosis not present

## 2023-04-07 DIAGNOSIS — Z992 Dependence on renal dialysis: Secondary | ICD-10-CM | POA: Diagnosis not present

## 2023-04-07 DIAGNOSIS — N2581 Secondary hyperparathyroidism of renal origin: Secondary | ICD-10-CM | POA: Diagnosis not present

## 2023-04-07 DIAGNOSIS — N189 Chronic kidney disease, unspecified: Secondary | ICD-10-CM | POA: Diagnosis not present

## 2023-04-07 DIAGNOSIS — N186 End stage renal disease: Secondary | ICD-10-CM | POA: Diagnosis not present

## 2023-04-09 DIAGNOSIS — N2581 Secondary hyperparathyroidism of renal origin: Secondary | ICD-10-CM | POA: Diagnosis not present

## 2023-04-09 DIAGNOSIS — Z992 Dependence on renal dialysis: Secondary | ICD-10-CM | POA: Diagnosis not present

## 2023-04-09 DIAGNOSIS — D509 Iron deficiency anemia, unspecified: Secondary | ICD-10-CM | POA: Diagnosis not present

## 2023-04-09 DIAGNOSIS — N186 End stage renal disease: Secondary | ICD-10-CM | POA: Diagnosis not present

## 2023-04-09 DIAGNOSIS — D631 Anemia in chronic kidney disease: Secondary | ICD-10-CM | POA: Diagnosis not present

## 2023-04-09 DIAGNOSIS — N189 Chronic kidney disease, unspecified: Secondary | ICD-10-CM | POA: Diagnosis not present

## 2023-04-12 DIAGNOSIS — S61451A Open bite of right hand, initial encounter: Secondary | ICD-10-CM | POA: Diagnosis not present

## 2023-04-12 DIAGNOSIS — M79641 Pain in right hand: Secondary | ICD-10-CM | POA: Diagnosis not present

## 2023-04-12 DIAGNOSIS — N2581 Secondary hyperparathyroidism of renal origin: Secondary | ICD-10-CM | POA: Diagnosis not present

## 2023-04-12 DIAGNOSIS — L03113 Cellulitis of right upper limb: Secondary | ICD-10-CM | POA: Diagnosis not present

## 2023-04-12 DIAGNOSIS — N189 Chronic kidney disease, unspecified: Secondary | ICD-10-CM | POA: Diagnosis not present

## 2023-04-12 DIAGNOSIS — Z992 Dependence on renal dialysis: Secondary | ICD-10-CM | POA: Diagnosis not present

## 2023-04-12 DIAGNOSIS — Z23 Encounter for immunization: Secondary | ICD-10-CM | POA: Diagnosis not present

## 2023-04-12 DIAGNOSIS — N186 End stage renal disease: Secondary | ICD-10-CM | POA: Diagnosis not present

## 2023-04-12 DIAGNOSIS — D631 Anemia in chronic kidney disease: Secondary | ICD-10-CM | POA: Diagnosis not present

## 2023-04-12 DIAGNOSIS — D509 Iron deficiency anemia, unspecified: Secondary | ICD-10-CM | POA: Diagnosis not present

## 2023-04-13 DIAGNOSIS — E441 Mild protein-calorie malnutrition: Secondary | ICD-10-CM | POA: Diagnosis not present

## 2023-04-13 DIAGNOSIS — Z992 Dependence on renal dialysis: Secondary | ICD-10-CM | POA: Diagnosis not present

## 2023-04-13 DIAGNOSIS — N186 End stage renal disease: Secondary | ICD-10-CM | POA: Diagnosis not present

## 2023-04-13 DIAGNOSIS — S61451A Open bite of right hand, initial encounter: Secondary | ICD-10-CM | POA: Diagnosis not present

## 2023-04-13 DIAGNOSIS — I509 Heart failure, unspecified: Secondary | ICD-10-CM | POA: Diagnosis not present

## 2023-04-13 DIAGNOSIS — I132 Hypertensive heart and chronic kidney disease with heart failure and with stage 5 chronic kidney disease, or end stage renal disease: Secondary | ICD-10-CM | POA: Diagnosis not present

## 2023-04-13 DIAGNOSIS — E1142 Type 2 diabetes mellitus with diabetic polyneuropathy: Secondary | ICD-10-CM | POA: Diagnosis not present

## 2023-04-13 DIAGNOSIS — W540XXA Bitten by dog, initial encounter: Secondary | ICD-10-CM | POA: Diagnosis not present

## 2023-04-13 DIAGNOSIS — R011 Cardiac murmur, unspecified: Secondary | ICD-10-CM | POA: Diagnosis not present

## 2023-04-13 DIAGNOSIS — I770 Arteriovenous fistula, acquired: Secondary | ICD-10-CM | POA: Diagnosis not present

## 2023-04-13 DIAGNOSIS — I5042 Chronic combined systolic (congestive) and diastolic (congestive) heart failure: Secondary | ICD-10-CM | POA: Diagnosis not present

## 2023-04-16 DIAGNOSIS — I5042 Chronic combined systolic (congestive) and diastolic (congestive) heart failure: Secondary | ICD-10-CM | POA: Diagnosis not present

## 2023-04-16 DIAGNOSIS — E441 Mild protein-calorie malnutrition: Secondary | ICD-10-CM | POA: Diagnosis not present

## 2023-04-16 DIAGNOSIS — I509 Heart failure, unspecified: Secondary | ICD-10-CM | POA: Diagnosis not present

## 2023-04-16 DIAGNOSIS — D509 Iron deficiency anemia, unspecified: Secondary | ICD-10-CM | POA: Diagnosis not present

## 2023-04-16 DIAGNOSIS — N2581 Secondary hyperparathyroidism of renal origin: Secondary | ICD-10-CM | POA: Diagnosis not present

## 2023-04-16 DIAGNOSIS — E1122 Type 2 diabetes mellitus with diabetic chronic kidney disease: Secondary | ICD-10-CM | POA: Diagnosis not present

## 2023-04-16 DIAGNOSIS — S61451A Open bite of right hand, initial encounter: Secondary | ICD-10-CM | POA: Diagnosis not present

## 2023-04-16 DIAGNOSIS — Z992 Dependence on renal dialysis: Secondary | ICD-10-CM | POA: Diagnosis not present

## 2023-04-16 DIAGNOSIS — N189 Chronic kidney disease, unspecified: Secondary | ICD-10-CM | POA: Diagnosis not present

## 2023-04-16 DIAGNOSIS — I770 Arteriovenous fistula, acquired: Secondary | ICD-10-CM | POA: Diagnosis not present

## 2023-04-16 DIAGNOSIS — D631 Anemia in chronic kidney disease: Secondary | ICD-10-CM | POA: Diagnosis not present

## 2023-04-16 DIAGNOSIS — I132 Hypertensive heart and chronic kidney disease with heart failure and with stage 5 chronic kidney disease, or end stage renal disease: Secondary | ICD-10-CM | POA: Diagnosis not present

## 2023-04-16 DIAGNOSIS — N186 End stage renal disease: Secondary | ICD-10-CM | POA: Diagnosis not present

## 2023-04-16 DIAGNOSIS — R011 Cardiac murmur, unspecified: Secondary | ICD-10-CM | POA: Diagnosis not present

## 2023-04-16 DIAGNOSIS — W540XXA Bitten by dog, initial encounter: Secondary | ICD-10-CM | POA: Diagnosis not present

## 2023-04-19 DIAGNOSIS — N189 Chronic kidney disease, unspecified: Secondary | ICD-10-CM | POA: Diagnosis not present

## 2023-04-19 DIAGNOSIS — D631 Anemia in chronic kidney disease: Secondary | ICD-10-CM | POA: Diagnosis not present

## 2023-04-19 DIAGNOSIS — Z992 Dependence on renal dialysis: Secondary | ICD-10-CM | POA: Diagnosis not present

## 2023-04-19 DIAGNOSIS — N2581 Secondary hyperparathyroidism of renal origin: Secondary | ICD-10-CM | POA: Diagnosis not present

## 2023-04-19 DIAGNOSIS — D509 Iron deficiency anemia, unspecified: Secondary | ICD-10-CM | POA: Diagnosis not present

## 2023-04-19 DIAGNOSIS — N186 End stage renal disease: Secondary | ICD-10-CM | POA: Diagnosis not present

## 2023-04-21 DIAGNOSIS — D631 Anemia in chronic kidney disease: Secondary | ICD-10-CM | POA: Diagnosis not present

## 2023-04-21 DIAGNOSIS — N2581 Secondary hyperparathyroidism of renal origin: Secondary | ICD-10-CM | POA: Diagnosis not present

## 2023-04-21 DIAGNOSIS — Z992 Dependence on renal dialysis: Secondary | ICD-10-CM | POA: Diagnosis not present

## 2023-04-21 DIAGNOSIS — D509 Iron deficiency anemia, unspecified: Secondary | ICD-10-CM | POA: Diagnosis not present

## 2023-04-21 DIAGNOSIS — N186 End stage renal disease: Secondary | ICD-10-CM | POA: Diagnosis not present

## 2023-04-21 DIAGNOSIS — N189 Chronic kidney disease, unspecified: Secondary | ICD-10-CM | POA: Diagnosis not present

## 2023-04-23 DIAGNOSIS — D631 Anemia in chronic kidney disease: Secondary | ICD-10-CM | POA: Diagnosis not present

## 2023-04-23 DIAGNOSIS — Z992 Dependence on renal dialysis: Secondary | ICD-10-CM | POA: Diagnosis not present

## 2023-04-23 DIAGNOSIS — N186 End stage renal disease: Secondary | ICD-10-CM | POA: Diagnosis not present

## 2023-04-23 DIAGNOSIS — D509 Iron deficiency anemia, unspecified: Secondary | ICD-10-CM | POA: Diagnosis not present

## 2023-04-23 DIAGNOSIS — N189 Chronic kidney disease, unspecified: Secondary | ICD-10-CM | POA: Diagnosis not present

## 2023-04-23 DIAGNOSIS — N2581 Secondary hyperparathyroidism of renal origin: Secondary | ICD-10-CM | POA: Diagnosis not present

## 2023-04-24 DIAGNOSIS — N186 End stage renal disease: Secondary | ICD-10-CM | POA: Diagnosis not present

## 2023-04-24 DIAGNOSIS — E1129 Type 2 diabetes mellitus with other diabetic kidney complication: Secondary | ICD-10-CM | POA: Diagnosis not present

## 2023-04-24 DIAGNOSIS — Z992 Dependence on renal dialysis: Secondary | ICD-10-CM | POA: Diagnosis not present

## 2023-04-26 DIAGNOSIS — N189 Chronic kidney disease, unspecified: Secondary | ICD-10-CM | POA: Diagnosis not present

## 2023-04-26 DIAGNOSIS — D509 Iron deficiency anemia, unspecified: Secondary | ICD-10-CM | POA: Diagnosis not present

## 2023-04-26 DIAGNOSIS — N2581 Secondary hyperparathyroidism of renal origin: Secondary | ICD-10-CM | POA: Diagnosis not present

## 2023-04-26 DIAGNOSIS — N186 End stage renal disease: Secondary | ICD-10-CM | POA: Diagnosis not present

## 2023-04-26 DIAGNOSIS — D631 Anemia in chronic kidney disease: Secondary | ICD-10-CM | POA: Diagnosis not present

## 2023-04-26 DIAGNOSIS — Z992 Dependence on renal dialysis: Secondary | ICD-10-CM | POA: Diagnosis not present

## 2023-04-28 DIAGNOSIS — N186 End stage renal disease: Secondary | ICD-10-CM | POA: Diagnosis not present

## 2023-04-28 DIAGNOSIS — Z992 Dependence on renal dialysis: Secondary | ICD-10-CM | POA: Diagnosis not present

## 2023-04-28 DIAGNOSIS — D631 Anemia in chronic kidney disease: Secondary | ICD-10-CM | POA: Diagnosis not present

## 2023-04-28 DIAGNOSIS — N189 Chronic kidney disease, unspecified: Secondary | ICD-10-CM | POA: Diagnosis not present

## 2023-04-28 DIAGNOSIS — D509 Iron deficiency anemia, unspecified: Secondary | ICD-10-CM | POA: Diagnosis not present

## 2023-04-28 DIAGNOSIS — N2581 Secondary hyperparathyroidism of renal origin: Secondary | ICD-10-CM | POA: Diagnosis not present

## 2023-04-30 DIAGNOSIS — N186 End stage renal disease: Secondary | ICD-10-CM | POA: Diagnosis not present

## 2023-04-30 DIAGNOSIS — N2581 Secondary hyperparathyroidism of renal origin: Secondary | ICD-10-CM | POA: Diagnosis not present

## 2023-04-30 DIAGNOSIS — D631 Anemia in chronic kidney disease: Secondary | ICD-10-CM | POA: Diagnosis not present

## 2023-04-30 DIAGNOSIS — Z992 Dependence on renal dialysis: Secondary | ICD-10-CM | POA: Diagnosis not present

## 2023-04-30 DIAGNOSIS — D509 Iron deficiency anemia, unspecified: Secondary | ICD-10-CM | POA: Diagnosis not present

## 2023-04-30 DIAGNOSIS — N189 Chronic kidney disease, unspecified: Secondary | ICD-10-CM | POA: Diagnosis not present

## 2023-05-04 ENCOUNTER — Ambulatory Visit: Payer: Medicare Other | Admitting: Orthopedic Surgery

## 2023-05-04 ENCOUNTER — Ambulatory Visit: Admitting: Family

## 2023-05-04 DIAGNOSIS — N186 End stage renal disease: Secondary | ICD-10-CM | POA: Diagnosis not present

## 2023-05-04 DIAGNOSIS — N2581 Secondary hyperparathyroidism of renal origin: Secondary | ICD-10-CM | POA: Diagnosis not present

## 2023-05-04 DIAGNOSIS — D631 Anemia in chronic kidney disease: Secondary | ICD-10-CM | POA: Diagnosis not present

## 2023-05-04 DIAGNOSIS — N189 Chronic kidney disease, unspecified: Secondary | ICD-10-CM | POA: Diagnosis not present

## 2023-05-04 DIAGNOSIS — D509 Iron deficiency anemia, unspecified: Secondary | ICD-10-CM | POA: Diagnosis not present

## 2023-05-04 DIAGNOSIS — Z992 Dependence on renal dialysis: Secondary | ICD-10-CM | POA: Diagnosis not present

## 2023-05-05 DIAGNOSIS — D509 Iron deficiency anemia, unspecified: Secondary | ICD-10-CM | POA: Diagnosis not present

## 2023-05-05 DIAGNOSIS — N2581 Secondary hyperparathyroidism of renal origin: Secondary | ICD-10-CM | POA: Diagnosis not present

## 2023-05-05 DIAGNOSIS — N186 End stage renal disease: Secondary | ICD-10-CM | POA: Diagnosis not present

## 2023-05-05 DIAGNOSIS — D631 Anemia in chronic kidney disease: Secondary | ICD-10-CM | POA: Diagnosis not present

## 2023-05-05 DIAGNOSIS — N189 Chronic kidney disease, unspecified: Secondary | ICD-10-CM | POA: Diagnosis not present

## 2023-05-05 DIAGNOSIS — Z992 Dependence on renal dialysis: Secondary | ICD-10-CM | POA: Diagnosis not present

## 2023-05-07 DIAGNOSIS — N2581 Secondary hyperparathyroidism of renal origin: Secondary | ICD-10-CM | POA: Diagnosis not present

## 2023-05-07 DIAGNOSIS — D631 Anemia in chronic kidney disease: Secondary | ICD-10-CM | POA: Diagnosis not present

## 2023-05-07 DIAGNOSIS — N189 Chronic kidney disease, unspecified: Secondary | ICD-10-CM | POA: Diagnosis not present

## 2023-05-07 DIAGNOSIS — N186 End stage renal disease: Secondary | ICD-10-CM | POA: Diagnosis not present

## 2023-05-07 DIAGNOSIS — D509 Iron deficiency anemia, unspecified: Secondary | ICD-10-CM | POA: Diagnosis not present

## 2023-05-07 DIAGNOSIS — Z992 Dependence on renal dialysis: Secondary | ICD-10-CM | POA: Diagnosis not present

## 2023-05-10 DIAGNOSIS — D631 Anemia in chronic kidney disease: Secondary | ICD-10-CM | POA: Diagnosis not present

## 2023-05-10 DIAGNOSIS — Z992 Dependence on renal dialysis: Secondary | ICD-10-CM | POA: Diagnosis not present

## 2023-05-10 DIAGNOSIS — N189 Chronic kidney disease, unspecified: Secondary | ICD-10-CM | POA: Diagnosis not present

## 2023-05-10 DIAGNOSIS — N186 End stage renal disease: Secondary | ICD-10-CM | POA: Diagnosis not present

## 2023-05-10 DIAGNOSIS — D509 Iron deficiency anemia, unspecified: Secondary | ICD-10-CM | POA: Diagnosis not present

## 2023-05-10 DIAGNOSIS — N2581 Secondary hyperparathyroidism of renal origin: Secondary | ICD-10-CM | POA: Diagnosis not present

## 2023-05-11 ENCOUNTER — Other Ambulatory Visit: Payer: Medicare Other

## 2023-05-12 DIAGNOSIS — Z992 Dependence on renal dialysis: Secondary | ICD-10-CM | POA: Diagnosis not present

## 2023-05-12 DIAGNOSIS — N189 Chronic kidney disease, unspecified: Secondary | ICD-10-CM | POA: Diagnosis not present

## 2023-05-12 DIAGNOSIS — N2581 Secondary hyperparathyroidism of renal origin: Secondary | ICD-10-CM | POA: Diagnosis not present

## 2023-05-12 DIAGNOSIS — D631 Anemia in chronic kidney disease: Secondary | ICD-10-CM | POA: Diagnosis not present

## 2023-05-12 DIAGNOSIS — N186 End stage renal disease: Secondary | ICD-10-CM | POA: Diagnosis not present

## 2023-05-12 DIAGNOSIS — D509 Iron deficiency anemia, unspecified: Secondary | ICD-10-CM | POA: Diagnosis not present

## 2023-05-14 DIAGNOSIS — N186 End stage renal disease: Secondary | ICD-10-CM | POA: Diagnosis not present

## 2023-05-14 DIAGNOSIS — N189 Chronic kidney disease, unspecified: Secondary | ICD-10-CM | POA: Diagnosis not present

## 2023-05-14 DIAGNOSIS — D631 Anemia in chronic kidney disease: Secondary | ICD-10-CM | POA: Diagnosis not present

## 2023-05-14 DIAGNOSIS — D509 Iron deficiency anemia, unspecified: Secondary | ICD-10-CM | POA: Diagnosis not present

## 2023-05-14 DIAGNOSIS — Z992 Dependence on renal dialysis: Secondary | ICD-10-CM | POA: Diagnosis not present

## 2023-05-14 DIAGNOSIS — N2581 Secondary hyperparathyroidism of renal origin: Secondary | ICD-10-CM | POA: Diagnosis not present

## 2023-05-17 DIAGNOSIS — I70234 Atherosclerosis of native arteries of right leg with ulceration of heel and midfoot: Secondary | ICD-10-CM | POA: Diagnosis not present

## 2023-05-17 DIAGNOSIS — E1142 Type 2 diabetes mellitus with diabetic polyneuropathy: Secondary | ICD-10-CM | POA: Diagnosis not present

## 2023-05-17 DIAGNOSIS — W540XXA Bitten by dog, initial encounter: Secondary | ICD-10-CM | POA: Diagnosis not present

## 2023-05-17 DIAGNOSIS — F0150 Vascular dementia without behavioral disturbance: Secondary | ICD-10-CM | POA: Diagnosis not present

## 2023-05-17 DIAGNOSIS — N2581 Secondary hyperparathyroidism of renal origin: Secondary | ICD-10-CM | POA: Diagnosis not present

## 2023-05-17 DIAGNOSIS — N186 End stage renal disease: Secondary | ICD-10-CM | POA: Diagnosis not present

## 2023-05-17 DIAGNOSIS — D509 Iron deficiency anemia, unspecified: Secondary | ICD-10-CM | POA: Diagnosis not present

## 2023-05-17 DIAGNOSIS — E11621 Type 2 diabetes mellitus with foot ulcer: Secondary | ICD-10-CM | POA: Diagnosis not present

## 2023-05-17 DIAGNOSIS — N189 Chronic kidney disease, unspecified: Secondary | ICD-10-CM | POA: Diagnosis not present

## 2023-05-17 DIAGNOSIS — I132 Hypertensive heart and chronic kidney disease with heart failure and with stage 5 chronic kidney disease, or end stage renal disease: Secondary | ICD-10-CM | POA: Diagnosis not present

## 2023-05-17 DIAGNOSIS — I5042 Chronic combined systolic (congestive) and diastolic (congestive) heart failure: Secondary | ICD-10-CM | POA: Diagnosis not present

## 2023-05-17 DIAGNOSIS — I770 Arteriovenous fistula, acquired: Secondary | ICD-10-CM | POA: Diagnosis not present

## 2023-05-17 DIAGNOSIS — I70202 Unspecified atherosclerosis of native arteries of extremities, left leg: Secondary | ICD-10-CM | POA: Diagnosis not present

## 2023-05-17 DIAGNOSIS — D631 Anemia in chronic kidney disease: Secondary | ICD-10-CM | POA: Diagnosis not present

## 2023-05-17 DIAGNOSIS — I509 Heart failure, unspecified: Secondary | ICD-10-CM | POA: Diagnosis not present

## 2023-05-17 DIAGNOSIS — Z79899 Other long term (current) drug therapy: Secondary | ICD-10-CM | POA: Diagnosis not present

## 2023-05-17 DIAGNOSIS — L97519 Non-pressure chronic ulcer of other part of right foot with unspecified severity: Secondary | ICD-10-CM | POA: Diagnosis not present

## 2023-05-17 DIAGNOSIS — S61451A Open bite of right hand, initial encounter: Secondary | ICD-10-CM | POA: Diagnosis not present

## 2023-05-17 DIAGNOSIS — Z992 Dependence on renal dialysis: Secondary | ICD-10-CM | POA: Diagnosis not present

## 2023-05-19 DIAGNOSIS — N189 Chronic kidney disease, unspecified: Secondary | ICD-10-CM | POA: Diagnosis not present

## 2023-05-19 DIAGNOSIS — D509 Iron deficiency anemia, unspecified: Secondary | ICD-10-CM | POA: Diagnosis not present

## 2023-05-19 DIAGNOSIS — N186 End stage renal disease: Secondary | ICD-10-CM | POA: Diagnosis not present

## 2023-05-19 DIAGNOSIS — D631 Anemia in chronic kidney disease: Secondary | ICD-10-CM | POA: Diagnosis not present

## 2023-05-19 DIAGNOSIS — Z992 Dependence on renal dialysis: Secondary | ICD-10-CM | POA: Diagnosis not present

## 2023-05-19 DIAGNOSIS — N2581 Secondary hyperparathyroidism of renal origin: Secondary | ICD-10-CM | POA: Diagnosis not present

## 2023-05-21 DIAGNOSIS — D509 Iron deficiency anemia, unspecified: Secondary | ICD-10-CM | POA: Diagnosis not present

## 2023-05-21 DIAGNOSIS — N2581 Secondary hyperparathyroidism of renal origin: Secondary | ICD-10-CM | POA: Diagnosis not present

## 2023-05-21 DIAGNOSIS — N189 Chronic kidney disease, unspecified: Secondary | ICD-10-CM | POA: Diagnosis not present

## 2023-05-21 DIAGNOSIS — N186 End stage renal disease: Secondary | ICD-10-CM | POA: Diagnosis not present

## 2023-05-21 DIAGNOSIS — Z992 Dependence on renal dialysis: Secondary | ICD-10-CM | POA: Diagnosis not present

## 2023-05-21 DIAGNOSIS — D631 Anemia in chronic kidney disease: Secondary | ICD-10-CM | POA: Diagnosis not present

## 2023-05-25 DIAGNOSIS — Z992 Dependence on renal dialysis: Secondary | ICD-10-CM | POA: Diagnosis not present

## 2023-05-25 DIAGNOSIS — N186 End stage renal disease: Secondary | ICD-10-CM | POA: Diagnosis not present

## 2023-05-25 DIAGNOSIS — E1129 Type 2 diabetes mellitus with other diabetic kidney complication: Secondary | ICD-10-CM | POA: Diagnosis not present

## 2023-05-26 DIAGNOSIS — Z992 Dependence on renal dialysis: Secondary | ICD-10-CM | POA: Diagnosis not present

## 2023-05-26 DIAGNOSIS — D509 Iron deficiency anemia, unspecified: Secondary | ICD-10-CM | POA: Diagnosis not present

## 2023-05-26 DIAGNOSIS — N186 End stage renal disease: Secondary | ICD-10-CM | POA: Diagnosis not present

## 2023-05-26 DIAGNOSIS — N2581 Secondary hyperparathyroidism of renal origin: Secondary | ICD-10-CM | POA: Diagnosis not present

## 2023-05-26 DIAGNOSIS — E1122 Type 2 diabetes mellitus with diabetic chronic kidney disease: Secondary | ICD-10-CM | POA: Diagnosis not present

## 2023-05-26 DIAGNOSIS — D631 Anemia in chronic kidney disease: Secondary | ICD-10-CM | POA: Diagnosis not present

## 2023-05-26 DIAGNOSIS — N189 Chronic kidney disease, unspecified: Secondary | ICD-10-CM | POA: Diagnosis not present

## 2023-05-26 DIAGNOSIS — R110 Nausea: Secondary | ICD-10-CM | POA: Diagnosis not present

## 2023-05-28 DIAGNOSIS — R110 Nausea: Secondary | ICD-10-CM | POA: Diagnosis not present

## 2023-05-28 DIAGNOSIS — N2581 Secondary hyperparathyroidism of renal origin: Secondary | ICD-10-CM | POA: Diagnosis not present

## 2023-05-28 DIAGNOSIS — N186 End stage renal disease: Secondary | ICD-10-CM | POA: Diagnosis not present

## 2023-05-28 DIAGNOSIS — D509 Iron deficiency anemia, unspecified: Secondary | ICD-10-CM | POA: Diagnosis not present

## 2023-05-28 DIAGNOSIS — Z992 Dependence on renal dialysis: Secondary | ICD-10-CM | POA: Diagnosis not present

## 2023-05-28 DIAGNOSIS — N189 Chronic kidney disease, unspecified: Secondary | ICD-10-CM | POA: Diagnosis not present

## 2023-05-31 DIAGNOSIS — N186 End stage renal disease: Secondary | ICD-10-CM | POA: Diagnosis not present

## 2023-05-31 DIAGNOSIS — Z992 Dependence on renal dialysis: Secondary | ICD-10-CM | POA: Diagnosis not present

## 2023-05-31 DIAGNOSIS — R110 Nausea: Secondary | ICD-10-CM | POA: Diagnosis not present

## 2023-05-31 DIAGNOSIS — N189 Chronic kidney disease, unspecified: Secondary | ICD-10-CM | POA: Diagnosis not present

## 2023-05-31 DIAGNOSIS — D509 Iron deficiency anemia, unspecified: Secondary | ICD-10-CM | POA: Diagnosis not present

## 2023-05-31 DIAGNOSIS — N2581 Secondary hyperparathyroidism of renal origin: Secondary | ICD-10-CM | POA: Diagnosis not present

## 2023-06-02 DIAGNOSIS — Z992 Dependence on renal dialysis: Secondary | ICD-10-CM | POA: Diagnosis not present

## 2023-06-02 DIAGNOSIS — N186 End stage renal disease: Secondary | ICD-10-CM | POA: Diagnosis not present

## 2023-06-02 DIAGNOSIS — N2581 Secondary hyperparathyroidism of renal origin: Secondary | ICD-10-CM | POA: Diagnosis not present

## 2023-06-02 DIAGNOSIS — N189 Chronic kidney disease, unspecified: Secondary | ICD-10-CM | POA: Diagnosis not present

## 2023-06-02 DIAGNOSIS — D509 Iron deficiency anemia, unspecified: Secondary | ICD-10-CM | POA: Diagnosis not present

## 2023-06-02 DIAGNOSIS — R110 Nausea: Secondary | ICD-10-CM | POA: Diagnosis not present

## 2023-06-03 ENCOUNTER — Ambulatory Visit (HOSPITAL_BASED_OUTPATIENT_CLINIC_OR_DEPARTMENT_OTHER): Payer: Medicare Other | Admitting: Internal Medicine

## 2023-06-04 DIAGNOSIS — N186 End stage renal disease: Secondary | ICD-10-CM | POA: Diagnosis not present

## 2023-06-04 DIAGNOSIS — D509 Iron deficiency anemia, unspecified: Secondary | ICD-10-CM | POA: Diagnosis not present

## 2023-06-04 DIAGNOSIS — Z992 Dependence on renal dialysis: Secondary | ICD-10-CM | POA: Diagnosis not present

## 2023-06-04 DIAGNOSIS — R110 Nausea: Secondary | ICD-10-CM | POA: Diagnosis not present

## 2023-06-04 DIAGNOSIS — N189 Chronic kidney disease, unspecified: Secondary | ICD-10-CM | POA: Diagnosis not present

## 2023-06-04 DIAGNOSIS — N2581 Secondary hyperparathyroidism of renal origin: Secondary | ICD-10-CM | POA: Diagnosis not present

## 2023-06-07 DIAGNOSIS — Z992 Dependence on renal dialysis: Secondary | ICD-10-CM | POA: Diagnosis not present

## 2023-06-07 DIAGNOSIS — N186 End stage renal disease: Secondary | ICD-10-CM | POA: Diagnosis not present

## 2023-06-07 DIAGNOSIS — D509 Iron deficiency anemia, unspecified: Secondary | ICD-10-CM | POA: Diagnosis not present

## 2023-06-07 DIAGNOSIS — R110 Nausea: Secondary | ICD-10-CM | POA: Diagnosis not present

## 2023-06-07 DIAGNOSIS — N2581 Secondary hyperparathyroidism of renal origin: Secondary | ICD-10-CM | POA: Diagnosis not present

## 2023-06-07 DIAGNOSIS — N189 Chronic kidney disease, unspecified: Secondary | ICD-10-CM | POA: Diagnosis not present

## 2023-06-09 DIAGNOSIS — N186 End stage renal disease: Secondary | ICD-10-CM | POA: Diagnosis not present

## 2023-06-09 DIAGNOSIS — D509 Iron deficiency anemia, unspecified: Secondary | ICD-10-CM | POA: Diagnosis not present

## 2023-06-09 DIAGNOSIS — Z992 Dependence on renal dialysis: Secondary | ICD-10-CM | POA: Diagnosis not present

## 2023-06-09 DIAGNOSIS — R110 Nausea: Secondary | ICD-10-CM | POA: Diagnosis not present

## 2023-06-09 DIAGNOSIS — N2581 Secondary hyperparathyroidism of renal origin: Secondary | ICD-10-CM | POA: Diagnosis not present

## 2023-06-09 DIAGNOSIS — N189 Chronic kidney disease, unspecified: Secondary | ICD-10-CM | POA: Diagnosis not present

## 2023-06-11 DIAGNOSIS — R110 Nausea: Secondary | ICD-10-CM | POA: Diagnosis not present

## 2023-06-11 DIAGNOSIS — N2581 Secondary hyperparathyroidism of renal origin: Secondary | ICD-10-CM | POA: Diagnosis not present

## 2023-06-11 DIAGNOSIS — D509 Iron deficiency anemia, unspecified: Secondary | ICD-10-CM | POA: Diagnosis not present

## 2023-06-11 DIAGNOSIS — Z992 Dependence on renal dialysis: Secondary | ICD-10-CM | POA: Diagnosis not present

## 2023-06-11 DIAGNOSIS — N189 Chronic kidney disease, unspecified: Secondary | ICD-10-CM | POA: Diagnosis not present

## 2023-06-11 DIAGNOSIS — N186 End stage renal disease: Secondary | ICD-10-CM | POA: Diagnosis not present

## 2023-06-14 DIAGNOSIS — N186 End stage renal disease: Secondary | ICD-10-CM | POA: Diagnosis not present

## 2023-06-14 DIAGNOSIS — D509 Iron deficiency anemia, unspecified: Secondary | ICD-10-CM | POA: Diagnosis not present

## 2023-06-14 DIAGNOSIS — Z992 Dependence on renal dialysis: Secondary | ICD-10-CM | POA: Diagnosis not present

## 2023-06-14 DIAGNOSIS — N189 Chronic kidney disease, unspecified: Secondary | ICD-10-CM | POA: Diagnosis not present

## 2023-06-14 DIAGNOSIS — R110 Nausea: Secondary | ICD-10-CM | POA: Diagnosis not present

## 2023-06-14 DIAGNOSIS — N2581 Secondary hyperparathyroidism of renal origin: Secondary | ICD-10-CM | POA: Diagnosis not present

## 2023-06-16 DIAGNOSIS — N186 End stage renal disease: Secondary | ICD-10-CM | POA: Diagnosis not present

## 2023-06-16 DIAGNOSIS — N2581 Secondary hyperparathyroidism of renal origin: Secondary | ICD-10-CM | POA: Diagnosis not present

## 2023-06-16 DIAGNOSIS — D509 Iron deficiency anemia, unspecified: Secondary | ICD-10-CM | POA: Diagnosis not present

## 2023-06-16 DIAGNOSIS — N189 Chronic kidney disease, unspecified: Secondary | ICD-10-CM | POA: Diagnosis not present

## 2023-06-16 DIAGNOSIS — Z992 Dependence on renal dialysis: Secondary | ICD-10-CM | POA: Diagnosis not present

## 2023-06-16 DIAGNOSIS — R110 Nausea: Secondary | ICD-10-CM | POA: Diagnosis not present

## 2023-06-18 DIAGNOSIS — N186 End stage renal disease: Secondary | ICD-10-CM | POA: Diagnosis not present

## 2023-06-18 DIAGNOSIS — R110 Nausea: Secondary | ICD-10-CM | POA: Diagnosis not present

## 2023-06-18 DIAGNOSIS — N189 Chronic kidney disease, unspecified: Secondary | ICD-10-CM | POA: Diagnosis not present

## 2023-06-18 DIAGNOSIS — Z992 Dependence on renal dialysis: Secondary | ICD-10-CM | POA: Diagnosis not present

## 2023-06-18 DIAGNOSIS — N2581 Secondary hyperparathyroidism of renal origin: Secondary | ICD-10-CM | POA: Diagnosis not present

## 2023-06-18 DIAGNOSIS — D509 Iron deficiency anemia, unspecified: Secondary | ICD-10-CM | POA: Diagnosis not present

## 2023-06-21 DIAGNOSIS — N189 Chronic kidney disease, unspecified: Secondary | ICD-10-CM | POA: Diagnosis not present

## 2023-06-21 DIAGNOSIS — Z992 Dependence on renal dialysis: Secondary | ICD-10-CM | POA: Diagnosis not present

## 2023-06-21 DIAGNOSIS — R110 Nausea: Secondary | ICD-10-CM | POA: Diagnosis not present

## 2023-06-21 DIAGNOSIS — D509 Iron deficiency anemia, unspecified: Secondary | ICD-10-CM | POA: Diagnosis not present

## 2023-06-21 DIAGNOSIS — N186 End stage renal disease: Secondary | ICD-10-CM | POA: Diagnosis not present

## 2023-06-21 DIAGNOSIS — N2581 Secondary hyperparathyroidism of renal origin: Secondary | ICD-10-CM | POA: Diagnosis not present

## 2023-06-23 DIAGNOSIS — Z992 Dependence on renal dialysis: Secondary | ICD-10-CM | POA: Diagnosis not present

## 2023-06-23 DIAGNOSIS — N2581 Secondary hyperparathyroidism of renal origin: Secondary | ICD-10-CM | POA: Diagnosis not present

## 2023-06-23 DIAGNOSIS — R110 Nausea: Secondary | ICD-10-CM | POA: Diagnosis not present

## 2023-06-23 DIAGNOSIS — N189 Chronic kidney disease, unspecified: Secondary | ICD-10-CM | POA: Diagnosis not present

## 2023-06-23 DIAGNOSIS — D509 Iron deficiency anemia, unspecified: Secondary | ICD-10-CM | POA: Diagnosis not present

## 2023-06-23 DIAGNOSIS — N186 End stage renal disease: Secondary | ICD-10-CM | POA: Diagnosis not present

## 2023-06-24 DIAGNOSIS — N186 End stage renal disease: Secondary | ICD-10-CM | POA: Diagnosis not present

## 2023-06-24 DIAGNOSIS — Z992 Dependence on renal dialysis: Secondary | ICD-10-CM | POA: Diagnosis not present

## 2023-06-24 DIAGNOSIS — E1129 Type 2 diabetes mellitus with other diabetic kidney complication: Secondary | ICD-10-CM | POA: Diagnosis not present

## 2023-06-25 DIAGNOSIS — D509 Iron deficiency anemia, unspecified: Secondary | ICD-10-CM | POA: Diagnosis not present

## 2023-06-25 DIAGNOSIS — E1122 Type 2 diabetes mellitus with diabetic chronic kidney disease: Secondary | ICD-10-CM | POA: Diagnosis not present

## 2023-06-25 DIAGNOSIS — N2581 Secondary hyperparathyroidism of renal origin: Secondary | ICD-10-CM | POA: Diagnosis not present

## 2023-06-25 DIAGNOSIS — N189 Chronic kidney disease, unspecified: Secondary | ICD-10-CM | POA: Diagnosis not present

## 2023-06-25 DIAGNOSIS — D631 Anemia in chronic kidney disease: Secondary | ICD-10-CM | POA: Diagnosis not present

## 2023-06-25 DIAGNOSIS — N186 End stage renal disease: Secondary | ICD-10-CM | POA: Diagnosis not present

## 2023-06-25 DIAGNOSIS — Z992 Dependence on renal dialysis: Secondary | ICD-10-CM | POA: Diagnosis not present

## 2023-06-28 DIAGNOSIS — N2581 Secondary hyperparathyroidism of renal origin: Secondary | ICD-10-CM | POA: Diagnosis not present

## 2023-06-28 DIAGNOSIS — Z992 Dependence on renal dialysis: Secondary | ICD-10-CM | POA: Diagnosis not present

## 2023-06-28 DIAGNOSIS — D509 Iron deficiency anemia, unspecified: Secondary | ICD-10-CM | POA: Diagnosis not present

## 2023-06-28 DIAGNOSIS — N189 Chronic kidney disease, unspecified: Secondary | ICD-10-CM | POA: Diagnosis not present

## 2023-06-28 DIAGNOSIS — D631 Anemia in chronic kidney disease: Secondary | ICD-10-CM | POA: Diagnosis not present

## 2023-06-28 DIAGNOSIS — N186 End stage renal disease: Secondary | ICD-10-CM | POA: Diagnosis not present

## 2023-06-30 DIAGNOSIS — Z992 Dependence on renal dialysis: Secondary | ICD-10-CM | POA: Diagnosis not present

## 2023-06-30 DIAGNOSIS — D631 Anemia in chronic kidney disease: Secondary | ICD-10-CM | POA: Diagnosis not present

## 2023-06-30 DIAGNOSIS — N186 End stage renal disease: Secondary | ICD-10-CM | POA: Diagnosis not present

## 2023-06-30 DIAGNOSIS — N189 Chronic kidney disease, unspecified: Secondary | ICD-10-CM | POA: Diagnosis not present

## 2023-06-30 DIAGNOSIS — N2581 Secondary hyperparathyroidism of renal origin: Secondary | ICD-10-CM | POA: Diagnosis not present

## 2023-06-30 DIAGNOSIS — D509 Iron deficiency anemia, unspecified: Secondary | ICD-10-CM | POA: Diagnosis not present

## 2023-07-02 DIAGNOSIS — N189 Chronic kidney disease, unspecified: Secondary | ICD-10-CM | POA: Diagnosis not present

## 2023-07-02 DIAGNOSIS — N2581 Secondary hyperparathyroidism of renal origin: Secondary | ICD-10-CM | POA: Diagnosis not present

## 2023-07-02 DIAGNOSIS — D631 Anemia in chronic kidney disease: Secondary | ICD-10-CM | POA: Diagnosis not present

## 2023-07-02 DIAGNOSIS — N186 End stage renal disease: Secondary | ICD-10-CM | POA: Diagnosis not present

## 2023-07-02 DIAGNOSIS — D509 Iron deficiency anemia, unspecified: Secondary | ICD-10-CM | POA: Diagnosis not present

## 2023-07-02 DIAGNOSIS — Z992 Dependence on renal dialysis: Secondary | ICD-10-CM | POA: Diagnosis not present

## 2023-07-05 DIAGNOSIS — D631 Anemia in chronic kidney disease: Secondary | ICD-10-CM | POA: Diagnosis not present

## 2023-07-05 DIAGNOSIS — N2581 Secondary hyperparathyroidism of renal origin: Secondary | ICD-10-CM | POA: Diagnosis not present

## 2023-07-05 DIAGNOSIS — Z992 Dependence on renal dialysis: Secondary | ICD-10-CM | POA: Diagnosis not present

## 2023-07-05 DIAGNOSIS — D509 Iron deficiency anemia, unspecified: Secondary | ICD-10-CM | POA: Diagnosis not present

## 2023-07-05 DIAGNOSIS — N186 End stage renal disease: Secondary | ICD-10-CM | POA: Diagnosis not present

## 2023-07-05 DIAGNOSIS — N189 Chronic kidney disease, unspecified: Secondary | ICD-10-CM | POA: Diagnosis not present

## 2023-07-07 DIAGNOSIS — N186 End stage renal disease: Secondary | ICD-10-CM | POA: Diagnosis not present

## 2023-07-07 DIAGNOSIS — D631 Anemia in chronic kidney disease: Secondary | ICD-10-CM | POA: Diagnosis not present

## 2023-07-07 DIAGNOSIS — D509 Iron deficiency anemia, unspecified: Secondary | ICD-10-CM | POA: Diagnosis not present

## 2023-07-07 DIAGNOSIS — N2581 Secondary hyperparathyroidism of renal origin: Secondary | ICD-10-CM | POA: Diagnosis not present

## 2023-07-07 DIAGNOSIS — Z992 Dependence on renal dialysis: Secondary | ICD-10-CM | POA: Diagnosis not present

## 2023-07-07 DIAGNOSIS — N189 Chronic kidney disease, unspecified: Secondary | ICD-10-CM | POA: Diagnosis not present

## 2023-07-08 DIAGNOSIS — H02834 Dermatochalasis of left upper eyelid: Secondary | ICD-10-CM | POA: Diagnosis not present

## 2023-07-08 DIAGNOSIS — H524 Presbyopia: Secondary | ICD-10-CM | POA: Diagnosis not present

## 2023-07-08 DIAGNOSIS — H26493 Other secondary cataract, bilateral: Secondary | ICD-10-CM | POA: Diagnosis not present

## 2023-07-08 DIAGNOSIS — H52203 Unspecified astigmatism, bilateral: Secondary | ICD-10-CM | POA: Diagnosis not present

## 2023-07-08 DIAGNOSIS — H04123 Dry eye syndrome of bilateral lacrimal glands: Secondary | ICD-10-CM | POA: Diagnosis not present

## 2023-07-08 DIAGNOSIS — H02831 Dermatochalasis of right upper eyelid: Secondary | ICD-10-CM | POA: Diagnosis not present

## 2023-07-08 DIAGNOSIS — E113413 Type 2 diabetes mellitus with severe nonproliferative diabetic retinopathy with macular edema, bilateral: Secondary | ICD-10-CM | POA: Diagnosis not present

## 2023-07-09 DIAGNOSIS — D631 Anemia in chronic kidney disease: Secondary | ICD-10-CM | POA: Diagnosis not present

## 2023-07-09 DIAGNOSIS — Z992 Dependence on renal dialysis: Secondary | ICD-10-CM | POA: Diagnosis not present

## 2023-07-09 DIAGNOSIS — N189 Chronic kidney disease, unspecified: Secondary | ICD-10-CM | POA: Diagnosis not present

## 2023-07-09 DIAGNOSIS — D509 Iron deficiency anemia, unspecified: Secondary | ICD-10-CM | POA: Diagnosis not present

## 2023-07-09 DIAGNOSIS — N2581 Secondary hyperparathyroidism of renal origin: Secondary | ICD-10-CM | POA: Diagnosis not present

## 2023-07-09 DIAGNOSIS — N186 End stage renal disease: Secondary | ICD-10-CM | POA: Diagnosis not present

## 2023-07-12 DIAGNOSIS — D509 Iron deficiency anemia, unspecified: Secondary | ICD-10-CM | POA: Diagnosis not present

## 2023-07-12 DIAGNOSIS — N186 End stage renal disease: Secondary | ICD-10-CM | POA: Diagnosis not present

## 2023-07-12 DIAGNOSIS — D631 Anemia in chronic kidney disease: Secondary | ICD-10-CM | POA: Diagnosis not present

## 2023-07-12 DIAGNOSIS — Z992 Dependence on renal dialysis: Secondary | ICD-10-CM | POA: Diagnosis not present

## 2023-07-12 DIAGNOSIS — N189 Chronic kidney disease, unspecified: Secondary | ICD-10-CM | POA: Diagnosis not present

## 2023-07-12 DIAGNOSIS — N2581 Secondary hyperparathyroidism of renal origin: Secondary | ICD-10-CM | POA: Diagnosis not present

## 2023-07-13 ENCOUNTER — Ambulatory Visit (HOSPITAL_BASED_OUTPATIENT_CLINIC_OR_DEPARTMENT_OTHER): Admitting: General Surgery

## 2023-07-14 DIAGNOSIS — E1122 Type 2 diabetes mellitus with diabetic chronic kidney disease: Secondary | ICD-10-CM | POA: Diagnosis not present

## 2023-07-14 DIAGNOSIS — D631 Anemia in chronic kidney disease: Secondary | ICD-10-CM | POA: Diagnosis not present

## 2023-07-14 DIAGNOSIS — N2581 Secondary hyperparathyroidism of renal origin: Secondary | ICD-10-CM | POA: Diagnosis not present

## 2023-07-14 DIAGNOSIS — Z992 Dependence on renal dialysis: Secondary | ICD-10-CM | POA: Diagnosis not present

## 2023-07-14 DIAGNOSIS — N186 End stage renal disease: Secondary | ICD-10-CM | POA: Diagnosis not present

## 2023-07-14 DIAGNOSIS — N189 Chronic kidney disease, unspecified: Secondary | ICD-10-CM | POA: Diagnosis not present

## 2023-07-14 DIAGNOSIS — D509 Iron deficiency anemia, unspecified: Secondary | ICD-10-CM | POA: Diagnosis not present

## 2023-07-16 DIAGNOSIS — N186 End stage renal disease: Secondary | ICD-10-CM | POA: Diagnosis not present

## 2023-07-16 DIAGNOSIS — D631 Anemia in chronic kidney disease: Secondary | ICD-10-CM | POA: Diagnosis not present

## 2023-07-16 DIAGNOSIS — N2581 Secondary hyperparathyroidism of renal origin: Secondary | ICD-10-CM | POA: Diagnosis not present

## 2023-07-16 DIAGNOSIS — Z992 Dependence on renal dialysis: Secondary | ICD-10-CM | POA: Diagnosis not present

## 2023-07-16 DIAGNOSIS — D509 Iron deficiency anemia, unspecified: Secondary | ICD-10-CM | POA: Diagnosis not present

## 2023-07-16 DIAGNOSIS — N189 Chronic kidney disease, unspecified: Secondary | ICD-10-CM | POA: Diagnosis not present

## 2023-07-19 DIAGNOSIS — D631 Anemia in chronic kidney disease: Secondary | ICD-10-CM | POA: Diagnosis not present

## 2023-07-19 DIAGNOSIS — N2581 Secondary hyperparathyroidism of renal origin: Secondary | ICD-10-CM | POA: Diagnosis not present

## 2023-07-19 DIAGNOSIS — N186 End stage renal disease: Secondary | ICD-10-CM | POA: Diagnosis not present

## 2023-07-19 DIAGNOSIS — N189 Chronic kidney disease, unspecified: Secondary | ICD-10-CM | POA: Diagnosis not present

## 2023-07-19 DIAGNOSIS — D509 Iron deficiency anemia, unspecified: Secondary | ICD-10-CM | POA: Diagnosis not present

## 2023-07-19 DIAGNOSIS — Z992 Dependence on renal dialysis: Secondary | ICD-10-CM | POA: Diagnosis not present

## 2023-07-22 ENCOUNTER — Ambulatory Visit (HOSPITAL_COMMUNITY)
Admission: RE | Admit: 2023-07-22 | Discharge: 2023-07-22 | Disposition: A | Discharge status: Home / Self Care | Source: Ambulatory Visit | Attending: Surgery | Admitting: Surgery

## 2023-07-22 ENCOUNTER — Encounter (HOSPITAL_COMMUNITY)
Admission: RE | Disposition: A | Discharge status: Home / Self Care | Payer: Self-pay | Source: Ambulatory Visit | Attending: Surgery

## 2023-07-22 ENCOUNTER — Other Ambulatory Visit: Payer: Self-pay

## 2023-07-22 DIAGNOSIS — T82898A Other specified complication of vascular prosthetic devices, implants and grafts, initial encounter: Secondary | ICD-10-CM | POA: Diagnosis not present

## 2023-07-22 DIAGNOSIS — E1122 Type 2 diabetes mellitus with diabetic chronic kidney disease: Secondary | ICD-10-CM | POA: Diagnosis not present

## 2023-07-22 DIAGNOSIS — Y832 Surgical operation with anastomosis, bypass or graft as the cause of abnormal reaction of the patient, or of later complication, without mention of misadventure at the time of the procedure: Secondary | ICD-10-CM | POA: Diagnosis not present

## 2023-07-22 DIAGNOSIS — N186 End stage renal disease: Secondary | ICD-10-CM | POA: Diagnosis not present

## 2023-07-22 DIAGNOSIS — T82590A Other mechanical complication of surgically created arteriovenous fistula, initial encounter: Secondary | ICD-10-CM | POA: Diagnosis not present

## 2023-07-22 DIAGNOSIS — N185 Chronic kidney disease, stage 5: Secondary | ICD-10-CM

## 2023-07-22 DIAGNOSIS — Z992 Dependence on renal dialysis: Secondary | ICD-10-CM | POA: Diagnosis not present

## 2023-07-22 DIAGNOSIS — I120 Hypertensive chronic kidney disease with stage 5 chronic kidney disease or end stage renal disease: Secondary | ICD-10-CM | POA: Insufficient documentation

## 2023-07-22 HISTORY — PX: VENOUS ANGIOPLASTY: CATH118376

## 2023-07-22 MED ORDER — HEPARIN (PORCINE) IN NACL 1000-0.9 UT/500ML-% IV SOLN
INTRAVENOUS | Status: DC | PRN
  Administered 2023-07-22: 500 mL

## 2023-07-22 MED ORDER — IODIXANOL 320 MG/ML IV SOLN
INTRAVENOUS | Status: DC | PRN
  Administered 2023-07-22: 30 mL

## 2023-07-22 MED ORDER — LIDOCAINE HCL (PF) 1 % IJ SOLN
INTRAMUSCULAR | Status: AC
  Filled 2023-07-22: qty 30

## 2023-07-22 NOTE — H&P (Signed)
 H&P      History of Present Illness: This is a 73 y.o. female with end-stage renal disease that presents for left arm fistulogram.  Patient has a left brachiocephalic AV fistula placed in 8119 by myself.  Patient states to her knowledge this has been working really well.  Referral was placed by the dialysis center.  Past Medical History:  Diagnosis Date   Anemia    patient preference - stopped iron     ARF (acute renal failure) (HCC) 04/2016   dehydration   Chronic kidney disease    stage 5   DDD (degenerative disc disease), lumbar    Depression    Diabetes mellitus    Type II - pt preference - stopped lantus   Diabetic retinopathy (HCC)    Diverticulitis    History of kidney stones    passed- 6   Hypertension    Lacunar infarction (HCC)    Osteomyelitis (HCC)    Polyneuropathy    Vitamin B 12 deficiency 06/09/2017   Vitamin D deficiency    Wears partial dentures    upper    Past Surgical History:  Procedure Laterality Date   ABDOMINAL AORTOGRAM W/LOWER EXTREMITY Right 02/11/2023   Procedure: ABDOMINAL AORTOGRAM W/LOWER EXTREMITY;  Surgeon: Young Hensen, MD;  Location: [facility];  Service: Cardiovascular;  Laterality: Right;   AMPUTATION Right 08/14/2016   Procedure: RIGHT 1ST RAY AMPUTATION MID-SHAFT;  Surgeon: Timothy Ford, MD;  Location: [facility] OR;  Service: Orthopedics;  Laterality: Right;   BASCILIC VEIN TRANSPOSITION Left 06/08/2019   Procedure: LEFT BRACHIOCEPHALIC VEIN CREATION;  Surgeon: Young Hensen, MD;  Location: [facility] OR;  Service: Vascular;  Laterality: Left;   CATARACT EXTRACTION  2018   INCISION AND DRAINAGE ABSCESS Left 02/11/2014   Procedure: INCISION AND DRAINAGE ABSCESS Left Buttock;  Surgeon: Adalberto Hollow, MD;  Location: [facility];  Service: General;  Laterality: Left;   INSERTION OF DIALYSIS CATHETER Left 09/07/2019   Procedure: INSERTION OF LEFT INTERNAL JUGULAR DIALYSIS CATHETER;  Surgeon: Mayo Speck, MD;  Location: [facility] OR;   Service: Vascular;  Laterality: Left;   IR REMOVAL TUN CV CATH W/O FL  09/04/2019   LAPAROSCOPIC SMALL BOWEL RESECTION N/A 05/09/2016   Procedure: LAPAROSCOPIC SMALL BOWEL RESECTION;  Surgeon: Candyce Champagne, MD;  Location: [facility];  Service: General;  Laterality: N/A;   LAPAROSCOPY N/A 05/09/2016   Procedure: LAPAROSCOPY DIAGNOSTIC, LYSIS OF ADHESIONS, SMALL BOWEL RESECTION X 2;  Surgeon: Candyce Champagne, MD;  Location: [facility];  Service: General;  Laterality: N/A;   TOE AMPUTATION     left foot great toe    No Known Allergies  Prior to Admission medications   Medication Sig Start Date End Date Taking? Authorizing Provider  amLODipine  (NORVASC ) 10 MG tablet Take 10 mg by mouth daily.    [provider]  ascorbic acid (VITAMIN C) 500 MG tablet Take 500 mg by mouth daily.    [provider]  aspirin  EC 81 MG tablet Take 1 tablet (81 mg total) by mouth daily. Swallow whole. 10/02/22   Daren Eck, DO  atorvastatin  (LIPITOR) 40 MG tablet TAKE 1 TABLET(40 MG) BY MOUTH DAILY 09/01/22   Dala Dublin, MD  AURYXIA  1 GM 210 MG(Fe) tablet Take 210 mg by mouth 3 (three) times daily with meals. 02/06/21   [provider]  carvedilol  (COREG ) 6.25 MG tablet Take 3.125 mg by mouth 2 (two) times daily. 01/22/23   [provider]  donepezil  (ARICEPT ) 5  MG tablet Take 1/2 pill daily for 4 weeks, then increase to 1 pill daily Patient taking differently: Take 5 mg by mouth every evening. 12/22/21   Dala Dublin, MD  multivitamin (RENA-VIT) TABS tablet Take 1 tablet by mouth daily.    [provider]  olmesartan  (BENICAR ) 40 MG tablet Take 1/2 tablet (20 mg total) by mouth daily. Patient not taking: Reported on 02/05/2023 07/29/22       Social History   Socioeconomic History   Marital status: Married    Spouse name: Not on file   Number of children: Not on file   Years of education: Not on file   Highest education level: Not on file  Occupational History   Not  on file  Tobacco Use   Smoking status: Never   Smokeless tobacco: Never  Vaping Use   Vaping status: Never Used  Substance and Sexual Activity   Alcohol  use: No    Alcohol /week: 0.0 standard drinks of alcohol    Drug use: No   Sexual activity: Not on file  Other Topics Concern   Not on file  Social History Narrative   Not on file   Social Drivers of Health   Financial Resource Strain: Not on file  Food Insecurity: No Food Insecurity (09/30/2022)   Hunger Vital Sign    Worried About Running Out of Food in the Last Year: Never true    Ran Out of Food in the Last Year: Never true  Transportation Needs: No Transportation Needs (09/30/2022)   PRAPARE - Administrator, Civil Service (Medical): No    Lack of Transportation (Non-Medical): No  Physical Activity: Not on file  Stress: Not on file  Social Connections: Not on file  Intimate Partner Violence: Not At Risk (09/30/2022)   Humiliation, Afraid, Rape, and Kick questionnaire    Fear of Current or Ex-Partner: No    Emotionally Abused: No    Physically Abused: No    Sexually Abused: No     Family History  Problem Relation Age of Onset   Diabetes Mother    Asthma Sister     ROS: [x]  Positive   [ ]  Negative   [ ]  All sytems reviewed and are negative  Cardiovascular: []  chest pain/pressure []  palpitations []  SOB lying flat []  DOE []  pain in legs while walking []  pain in legs at rest []  pain in legs at night []  non-healing ulcers []  hx of DVT []  swelling in legs  Pulmonary: []  productive cough []  asthma/wheezing []  home O2  Neurologic: []  weakness in []  arms []  legs []  numbness in []  arms []  legs []  hx of CVA []  mini stroke [] difficulty speaking or slurred speech []  temporary loss of vision in one eye []  dizziness  Hematologic: []  hx of cancer []  bleeding problems []  problems with blood clotting easily  Endocrine:   []  diabetes []  thyroid  disease  GI []  vomiting blood []  blood in  stool  GU: []  CKD/renal failure []  HD--[]  M/W/F or []  T/T/S []  burning with urination []  blood in urine  Psychiatric: []  anxiety []  depression  Musculoskeletal: []  arthritis []  joint pain  Integumentary: []  rashes []  ulcers  Constitutional: []  fever []  chills   Physical Examination  Vitals:   07/22/23 0958  BP: (!) 177/61  Pulse: 62  Resp: 12  Temp: 97.6 F (36.4 C)  SpO2: 98%   There is no height or weight on file to calculate BMI.  General:  WDWN in NAD  Gait: Not observed HENT: WNL, normocephalic Pulmonary: normal non-labored breathing Cardiac: regular, without  Murmurs, rubs or gallops Abdomen:  soft, NT/ND Vascular Exam/Pulses: Left brachiocephalic fistula with good thrill  CBC    Component Value Date/Time   WBC 6.5 02/12/2023 1738   RBC 3.53 (L) 02/12/2023 1738   HGB 11.3 (L) 02/12/2023 1738   HGB 11.0 (L) 11/26/2022 1024   HCT 36.1 02/12/2023 1738   PLT 92 (L) 02/12/2023 1738   PLT 81 (L) 11/26/2022 1024   MCV 102.3 (H) 02/12/2023 1738   MCV 89.6 02/10/2014 1127   MCH 32.0 02/12/2023 1738   MCHC 31.3 02/12/2023 1738   RDW 14.6 02/12/2023 1738   LYMPHSABS 2.3 02/12/2023 1738   MONOABS 0.6 02/12/2023 1738   EOSABS 0.2 02/12/2023 1738   BASOSABS 0.1 02/12/2023 1738    BMET    Component Value Date/Time   NA 142 02/12/2023 1738   K 3.1 (L) 02/12/2023 1738   CL 102 02/12/2023 1738   CO2 28 02/12/2023 1738   GLUCOSE 116 (H) 02/12/2023 1738   BUN 19 02/12/2023 1738   CREATININE 2.60 (H) 02/12/2023 1738   CREATININE 3.89 (HH) 12/20/2019 1518   CREATININE 0.74 02/10/2014 1220   CALCIUM  8.2 (L) 02/12/2023 1738   GFRNONAA 19 (L) 02/12/2023 1738   GFRNONAA 12 (L) 12/20/2019 1518   GFRAA 13 (L) 10/18/2019 1525    COAGS: Lab Results  Component Value Date   INR 1.1 09/30/2022   INR 1.2 07/11/2022   INR 1.5 (H) 09/03/2019     Non-Invasive Vascular Imaging:    N/A   ASSESSMENT/PLAN: This is a 73 y.o. female  with end-stage renal  disease that presents for left arm fistulogram.  Patient has a left brachiocephalic AV fistula placed in 2130 by myself.  Discussed plan for left arm fistulogram with possible intervention including risk and benefits.  Young Hensen, MD [facility] Office: [phone]  Young Hensen

## 2023-07-22 NOTE — Op Note (Signed)
    Patient name: Bailey [name] MRN: [id number] DOB: 11-Mar-1950 Sex: female  07/22/2023 Pre-operative Diagnosis: Malfunction of left arm AV fistula Post-operative diagnosis:  Same Surgeon:  Young Hensen, MD Procedure Performed: 1.  Ultrasound-guided access left brachiocephalic AV fistula 2.  Left arm fistulogram including central venogram 3.  Peripheral angioplasty left arm AV fistula at cephalic arch (7 mm x 80 mm Mustang)  Indications: 73 year old female who is referred by her dialysis center due to malfunction of left brachiocephalic AV fistula.  She presents for left arm fistulogram possible invention after risks-benefits discussed.  Contrast: 30 mL  Findings:   Left arm brachiocephalic fistula was accessed under ultrasound guidance.  No evidence of central venous stenosis.  At the cephalic arch there was about a 60% stenosis that was treated with a 7 mm Mustang with no residual stenosis.  The remainder of the left upper arm fistula was widely patent.  Excellent thrill.   Procedure:  The patient was identified in the holding area and taken to [facility] lab.  Patient was placed on the table in supine position.  The left arm was prepped and draped standard sterile fashion.  A timeout was performed.  I used 1% lidocaine  without epinephrine  to anesthetize the skin using 4 mL.  I then accessed the left arm fistula at the antecubitum with a micro access needle and placed a microwire and a micro sheath using ultrasound guidance.  The left arm fistula including the central vessels were evaluated with contrast injection.  Ultimately elected to intervene on the cephalic arch.  Upsized to a short 6 [place] sheath with a Bentson wire.  This would not track through the cephalic arch so I exchanged for a Glidewire and then treated the cephalic arch stenosis with a 6 mm x 80 mm Mustang nominal pressure for 2 minutes.  No significant residual stenosis.  Wires and catheters were removed.  A 4-0 Monocryl  pursestring tied down and sheath removed.     Young Hensen, MD [facility] Office: [phone]

## 2023-07-23 ENCOUNTER — Encounter (HOSPITAL_COMMUNITY): Payer: Self-pay | Admitting: Vascular Surgery

## 2023-07-23 DIAGNOSIS — Z992 Dependence on renal dialysis: Secondary | ICD-10-CM | POA: Diagnosis not present

## 2023-07-23 DIAGNOSIS — N2581 Secondary hyperparathyroidism of renal origin: Secondary | ICD-10-CM | POA: Diagnosis not present

## 2023-07-23 DIAGNOSIS — D509 Iron deficiency anemia, unspecified: Secondary | ICD-10-CM | POA: Diagnosis not present

## 2023-07-23 DIAGNOSIS — N186 End stage renal disease: Secondary | ICD-10-CM | POA: Diagnosis not present

## 2023-07-23 DIAGNOSIS — N189 Chronic kidney disease, unspecified: Secondary | ICD-10-CM | POA: Diagnosis not present

## 2023-07-23 DIAGNOSIS — D631 Anemia in chronic kidney disease: Secondary | ICD-10-CM | POA: Diagnosis not present

## 2023-07-25 DIAGNOSIS — E1129 Type 2 diabetes mellitus with other diabetic kidney complication: Secondary | ICD-10-CM | POA: Diagnosis not present

## 2023-07-25 DIAGNOSIS — Z992 Dependence on renal dialysis: Secondary | ICD-10-CM | POA: Diagnosis not present

## 2023-07-25 DIAGNOSIS — N186 End stage renal disease: Secondary | ICD-10-CM | POA: Diagnosis not present

## 2023-07-26 DIAGNOSIS — N186 End stage renal disease: Secondary | ICD-10-CM | POA: Diagnosis not present

## 2023-07-26 DIAGNOSIS — R110 Nausea: Secondary | ICD-10-CM | POA: Diagnosis not present

## 2023-07-26 DIAGNOSIS — D631 Anemia in chronic kidney disease: Secondary | ICD-10-CM | POA: Diagnosis not present

## 2023-07-26 DIAGNOSIS — Z992 Dependence on renal dialysis: Secondary | ICD-10-CM | POA: Diagnosis not present

## 2023-07-26 DIAGNOSIS — D509 Iron deficiency anemia, unspecified: Secondary | ICD-10-CM | POA: Diagnosis not present

## 2023-07-26 DIAGNOSIS — E1122 Type 2 diabetes mellitus with diabetic chronic kidney disease: Secondary | ICD-10-CM | POA: Diagnosis not present

## 2023-07-26 DIAGNOSIS — N189 Chronic kidney disease, unspecified: Secondary | ICD-10-CM | POA: Diagnosis not present

## 2023-07-26 DIAGNOSIS — N2581 Secondary hyperparathyroidism of renal origin: Secondary | ICD-10-CM | POA: Diagnosis not present

## 2023-07-28 ENCOUNTER — Other Ambulatory Visit: Payer: Self-pay | Admitting: Nurse Practitioner

## 2023-07-28 ENCOUNTER — Other Ambulatory Visit: Payer: Self-pay

## 2023-07-28 DIAGNOSIS — R748 Abnormal levels of other serum enzymes: Secondary | ICD-10-CM

## 2023-07-28 DIAGNOSIS — Z1231 Encounter for screening mammogram for malignant neoplasm of breast: Secondary | ICD-10-CM

## 2023-07-28 DIAGNOSIS — I132 Hypertensive heart and chronic kidney disease with heart failure and with stage 5 chronic kidney disease, or end stage renal disease: Secondary | ICD-10-CM | POA: Diagnosis not present

## 2023-07-28 DIAGNOSIS — D631 Anemia in chronic kidney disease: Secondary | ICD-10-CM | POA: Diagnosis not present

## 2023-07-28 DIAGNOSIS — R110 Nausea: Secondary | ICD-10-CM | POA: Diagnosis not present

## 2023-07-28 DIAGNOSIS — I509 Heart failure, unspecified: Secondary | ICD-10-CM | POA: Diagnosis not present

## 2023-07-28 DIAGNOSIS — I770 Arteriovenous fistula, acquired: Secondary | ICD-10-CM | POA: Diagnosis not present

## 2023-07-28 DIAGNOSIS — I5042 Chronic combined systolic (congestive) and diastolic (congestive) heart failure: Secondary | ICD-10-CM | POA: Diagnosis not present

## 2023-07-28 DIAGNOSIS — Z89419 Acquired absence of unspecified great toe: Secondary | ICD-10-CM | POA: Diagnosis not present

## 2023-07-28 DIAGNOSIS — Z0000 Encounter for general adult medical examination without abnormal findings: Secondary | ICD-10-CM | POA: Diagnosis not present

## 2023-07-28 DIAGNOSIS — N186 End stage renal disease: Secondary | ICD-10-CM | POA: Diagnosis not present

## 2023-07-28 DIAGNOSIS — N189 Chronic kidney disease, unspecified: Secondary | ICD-10-CM | POA: Diagnosis not present

## 2023-07-28 DIAGNOSIS — N2581 Secondary hyperparathyroidism of renal origin: Secondary | ICD-10-CM | POA: Diagnosis not present

## 2023-07-28 DIAGNOSIS — R159 Full incontinence of feces: Secondary | ICD-10-CM | POA: Diagnosis not present

## 2023-07-28 DIAGNOSIS — Z992 Dependence on renal dialysis: Secondary | ICD-10-CM | POA: Diagnosis not present

## 2023-07-28 DIAGNOSIS — F0150 Vascular dementia without behavioral disturbance: Secondary | ICD-10-CM | POA: Diagnosis not present

## 2023-07-29 ENCOUNTER — Other Ambulatory Visit

## 2023-07-30 DIAGNOSIS — N186 End stage renal disease: Secondary | ICD-10-CM | POA: Diagnosis not present

## 2023-07-30 DIAGNOSIS — D631 Anemia in chronic kidney disease: Secondary | ICD-10-CM | POA: Diagnosis not present

## 2023-07-30 DIAGNOSIS — N189 Chronic kidney disease, unspecified: Secondary | ICD-10-CM | POA: Diagnosis not present

## 2023-07-30 DIAGNOSIS — N2581 Secondary hyperparathyroidism of renal origin: Secondary | ICD-10-CM | POA: Diagnosis not present

## 2023-07-30 DIAGNOSIS — Z992 Dependence on renal dialysis: Secondary | ICD-10-CM | POA: Diagnosis not present

## 2023-07-30 DIAGNOSIS — R110 Nausea: Secondary | ICD-10-CM | POA: Diagnosis not present

## 2023-08-02 DIAGNOSIS — D631 Anemia in chronic kidney disease: Secondary | ICD-10-CM | POA: Diagnosis not present

## 2023-08-02 DIAGNOSIS — N186 End stage renal disease: Secondary | ICD-10-CM | POA: Diagnosis not present

## 2023-08-02 DIAGNOSIS — R110 Nausea: Secondary | ICD-10-CM | POA: Diagnosis not present

## 2023-08-02 DIAGNOSIS — N2581 Secondary hyperparathyroidism of renal origin: Secondary | ICD-10-CM | POA: Diagnosis not present

## 2023-08-02 DIAGNOSIS — N189 Chronic kidney disease, unspecified: Secondary | ICD-10-CM | POA: Diagnosis not present

## 2023-08-02 DIAGNOSIS — Z992 Dependence on renal dialysis: Secondary | ICD-10-CM | POA: Diagnosis not present

## 2023-08-04 DIAGNOSIS — N2581 Secondary hyperparathyroidism of renal origin: Secondary | ICD-10-CM | POA: Diagnosis not present

## 2023-08-04 DIAGNOSIS — N186 End stage renal disease: Secondary | ICD-10-CM | POA: Diagnosis not present

## 2023-08-04 DIAGNOSIS — D631 Anemia in chronic kidney disease: Secondary | ICD-10-CM | POA: Diagnosis not present

## 2023-08-04 DIAGNOSIS — Z992 Dependence on renal dialysis: Secondary | ICD-10-CM | POA: Diagnosis not present

## 2023-08-04 DIAGNOSIS — N189 Chronic kidney disease, unspecified: Secondary | ICD-10-CM | POA: Diagnosis not present

## 2023-08-04 DIAGNOSIS — R110 Nausea: Secondary | ICD-10-CM | POA: Diagnosis not present

## 2023-08-06 DIAGNOSIS — R110 Nausea: Secondary | ICD-10-CM | POA: Diagnosis not present

## 2023-08-06 DIAGNOSIS — D631 Anemia in chronic kidney disease: Secondary | ICD-10-CM | POA: Diagnosis not present

## 2023-08-06 DIAGNOSIS — N189 Chronic kidney disease, unspecified: Secondary | ICD-10-CM | POA: Diagnosis not present

## 2023-08-06 DIAGNOSIS — N2581 Secondary hyperparathyroidism of renal origin: Secondary | ICD-10-CM | POA: Diagnosis not present

## 2023-08-06 DIAGNOSIS — Z992 Dependence on renal dialysis: Secondary | ICD-10-CM | POA: Diagnosis not present

## 2023-08-06 DIAGNOSIS — N186 End stage renal disease: Secondary | ICD-10-CM | POA: Diagnosis not present

## 2023-08-09 DIAGNOSIS — N2581 Secondary hyperparathyroidism of renal origin: Secondary | ICD-10-CM | POA: Diagnosis not present

## 2023-08-09 DIAGNOSIS — R110 Nausea: Secondary | ICD-10-CM | POA: Diagnosis not present

## 2023-08-09 DIAGNOSIS — N189 Chronic kidney disease, unspecified: Secondary | ICD-10-CM | POA: Diagnosis not present

## 2023-08-09 DIAGNOSIS — D631 Anemia in chronic kidney disease: Secondary | ICD-10-CM | POA: Diagnosis not present

## 2023-08-09 DIAGNOSIS — N186 End stage renal disease: Secondary | ICD-10-CM | POA: Diagnosis not present

## 2023-08-09 DIAGNOSIS — Z992 Dependence on renal dialysis: Secondary | ICD-10-CM | POA: Diagnosis not present

## 2023-08-11 DIAGNOSIS — N186 End stage renal disease: Secondary | ICD-10-CM | POA: Diagnosis not present

## 2023-08-11 DIAGNOSIS — N2581 Secondary hyperparathyroidism of renal origin: Secondary | ICD-10-CM | POA: Diagnosis not present

## 2023-08-11 DIAGNOSIS — N189 Chronic kidney disease, unspecified: Secondary | ICD-10-CM | POA: Diagnosis not present

## 2023-08-11 DIAGNOSIS — R110 Nausea: Secondary | ICD-10-CM | POA: Diagnosis not present

## 2023-08-11 DIAGNOSIS — Z992 Dependence on renal dialysis: Secondary | ICD-10-CM | POA: Diagnosis not present

## 2023-08-11 DIAGNOSIS — D631 Anemia in chronic kidney disease: Secondary | ICD-10-CM | POA: Diagnosis not present

## 2023-08-13 ENCOUNTER — Ambulatory Visit
Admission: RE | Admit: 2023-08-13 | Discharge: 2023-08-13 | Disposition: A | Discharge status: Home / Self Care | Source: Ambulatory Visit | Attending: Nurse Practitioner | Admitting: Nurse Practitioner

## 2023-08-13 DIAGNOSIS — R945 Abnormal results of liver function studies: Secondary | ICD-10-CM | POA: Diagnosis not present

## 2023-08-13 DIAGNOSIS — R110 Nausea: Secondary | ICD-10-CM | POA: Diagnosis not present

## 2023-08-13 DIAGNOSIS — N189 Chronic kidney disease, unspecified: Secondary | ICD-10-CM | POA: Diagnosis not present

## 2023-08-13 DIAGNOSIS — R748 Abnormal levels of other serum enzymes: Secondary | ICD-10-CM

## 2023-08-13 DIAGNOSIS — D631 Anemia in chronic kidney disease: Secondary | ICD-10-CM | POA: Diagnosis not present

## 2023-08-13 DIAGNOSIS — Z992 Dependence on renal dialysis: Secondary | ICD-10-CM | POA: Diagnosis not present

## 2023-08-13 DIAGNOSIS — N186 End stage renal disease: Secondary | ICD-10-CM | POA: Diagnosis not present

## 2023-08-13 DIAGNOSIS — N2581 Secondary hyperparathyroidism of renal origin: Secondary | ICD-10-CM | POA: Diagnosis not present

## 2023-08-16 DIAGNOSIS — N186 End stage renal disease: Secondary | ICD-10-CM | POA: Diagnosis not present

## 2023-08-16 DIAGNOSIS — N2581 Secondary hyperparathyroidism of renal origin: Secondary | ICD-10-CM | POA: Diagnosis not present

## 2023-08-16 DIAGNOSIS — R110 Nausea: Secondary | ICD-10-CM | POA: Diagnosis not present

## 2023-08-16 DIAGNOSIS — Z992 Dependence on renal dialysis: Secondary | ICD-10-CM | POA: Diagnosis not present

## 2023-08-16 DIAGNOSIS — N189 Chronic kidney disease, unspecified: Secondary | ICD-10-CM | POA: Diagnosis not present

## 2023-08-16 DIAGNOSIS — D631 Anemia in chronic kidney disease: Secondary | ICD-10-CM | POA: Diagnosis not present

## 2023-08-19 DIAGNOSIS — R110 Nausea: Secondary | ICD-10-CM | POA: Diagnosis not present

## 2023-08-19 DIAGNOSIS — D631 Anemia in chronic kidney disease: Secondary | ICD-10-CM | POA: Diagnosis not present

## 2023-08-19 DIAGNOSIS — Z992 Dependence on renal dialysis: Secondary | ICD-10-CM | POA: Diagnosis not present

## 2023-08-19 DIAGNOSIS — N186 End stage renal disease: Secondary | ICD-10-CM | POA: Diagnosis not present

## 2023-08-19 DIAGNOSIS — N189 Chronic kidney disease, unspecified: Secondary | ICD-10-CM | POA: Diagnosis not present

## 2023-08-19 DIAGNOSIS — N2581 Secondary hyperparathyroidism of renal origin: Secondary | ICD-10-CM | POA: Diagnosis not present

## 2023-08-20 DIAGNOSIS — Z992 Dependence on renal dialysis: Secondary | ICD-10-CM | POA: Diagnosis not present

## 2023-08-20 DIAGNOSIS — N2581 Secondary hyperparathyroidism of renal origin: Secondary | ICD-10-CM | POA: Diagnosis not present

## 2023-08-20 DIAGNOSIS — R110 Nausea: Secondary | ICD-10-CM | POA: Diagnosis not present

## 2023-08-20 DIAGNOSIS — D631 Anemia in chronic kidney disease: Secondary | ICD-10-CM | POA: Diagnosis not present

## 2023-08-20 DIAGNOSIS — N189 Chronic kidney disease, unspecified: Secondary | ICD-10-CM | POA: Diagnosis not present

## 2023-08-20 DIAGNOSIS — N186 End stage renal disease: Secondary | ICD-10-CM | POA: Diagnosis not present

## 2023-08-23 DIAGNOSIS — D631 Anemia in chronic kidney disease: Secondary | ICD-10-CM | POA: Diagnosis not present

## 2023-08-23 DIAGNOSIS — Z992 Dependence on renal dialysis: Secondary | ICD-10-CM | POA: Diagnosis not present

## 2023-08-23 DIAGNOSIS — N189 Chronic kidney disease, unspecified: Secondary | ICD-10-CM | POA: Diagnosis not present

## 2023-08-23 DIAGNOSIS — N186 End stage renal disease: Secondary | ICD-10-CM | POA: Diagnosis not present

## 2023-08-23 DIAGNOSIS — R110 Nausea: Secondary | ICD-10-CM | POA: Diagnosis not present

## 2023-08-23 DIAGNOSIS — N2581 Secondary hyperparathyroidism of renal origin: Secondary | ICD-10-CM | POA: Diagnosis not present

## 2023-08-24 DIAGNOSIS — N186 End stage renal disease: Secondary | ICD-10-CM | POA: Diagnosis not present

## 2023-08-24 DIAGNOSIS — Z992 Dependence on renal dialysis: Secondary | ICD-10-CM | POA: Diagnosis not present

## 2023-08-24 DIAGNOSIS — E1129 Type 2 diabetes mellitus with other diabetic kidney complication: Secondary | ICD-10-CM | POA: Diagnosis not present

## 2023-08-26 ENCOUNTER — Ambulatory Visit

## 2023-08-27 DIAGNOSIS — D509 Iron deficiency anemia, unspecified: Secondary | ICD-10-CM | POA: Diagnosis not present

## 2023-08-27 DIAGNOSIS — Z992 Dependence on renal dialysis: Secondary | ICD-10-CM | POA: Diagnosis not present

## 2023-08-27 DIAGNOSIS — N186 End stage renal disease: Secondary | ICD-10-CM | POA: Diagnosis not present

## 2023-08-27 DIAGNOSIS — E1122 Type 2 diabetes mellitus with diabetic chronic kidney disease: Secondary | ICD-10-CM | POA: Diagnosis not present

## 2023-08-27 DIAGNOSIS — R110 Nausea: Secondary | ICD-10-CM | POA: Diagnosis not present

## 2023-08-27 DIAGNOSIS — N189 Chronic kidney disease, unspecified: Secondary | ICD-10-CM | POA: Diagnosis not present

## 2023-08-27 DIAGNOSIS — N2581 Secondary hyperparathyroidism of renal origin: Secondary | ICD-10-CM | POA: Diagnosis not present

## 2023-08-27 DIAGNOSIS — D631 Anemia in chronic kidney disease: Secondary | ICD-10-CM | POA: Diagnosis not present

## 2023-08-30 DIAGNOSIS — D509 Iron deficiency anemia, unspecified: Secondary | ICD-10-CM | POA: Diagnosis not present

## 2023-08-30 DIAGNOSIS — Z992 Dependence on renal dialysis: Secondary | ICD-10-CM | POA: Diagnosis not present

## 2023-08-30 DIAGNOSIS — R110 Nausea: Secondary | ICD-10-CM | POA: Diagnosis not present

## 2023-08-30 DIAGNOSIS — N189 Chronic kidney disease, unspecified: Secondary | ICD-10-CM | POA: Diagnosis not present

## 2023-08-30 DIAGNOSIS — N186 End stage renal disease: Secondary | ICD-10-CM | POA: Diagnosis not present

## 2023-08-30 DIAGNOSIS — N2581 Secondary hyperparathyroidism of renal origin: Secondary | ICD-10-CM | POA: Diagnosis not present

## 2023-09-01 DIAGNOSIS — N189 Chronic kidney disease, unspecified: Secondary | ICD-10-CM | POA: Diagnosis not present

## 2023-09-01 DIAGNOSIS — N186 End stage renal disease: Secondary | ICD-10-CM | POA: Diagnosis not present

## 2023-09-01 DIAGNOSIS — D509 Iron deficiency anemia, unspecified: Secondary | ICD-10-CM | POA: Diagnosis not present

## 2023-09-01 DIAGNOSIS — R110 Nausea: Secondary | ICD-10-CM | POA: Diagnosis not present

## 2023-09-01 DIAGNOSIS — Z992 Dependence on renal dialysis: Secondary | ICD-10-CM | POA: Diagnosis not present

## 2023-09-01 DIAGNOSIS — N2581 Secondary hyperparathyroidism of renal origin: Secondary | ICD-10-CM | POA: Diagnosis not present

## 2023-09-02 ENCOUNTER — Other Ambulatory Visit

## 2023-09-03 DIAGNOSIS — N186 End stage renal disease: Secondary | ICD-10-CM | POA: Diagnosis not present

## 2023-09-03 DIAGNOSIS — Z992 Dependence on renal dialysis: Secondary | ICD-10-CM | POA: Diagnosis not present

## 2023-09-03 DIAGNOSIS — N2581 Secondary hyperparathyroidism of renal origin: Secondary | ICD-10-CM | POA: Diagnosis not present

## 2023-09-03 DIAGNOSIS — R110 Nausea: Secondary | ICD-10-CM | POA: Diagnosis not present

## 2023-09-03 DIAGNOSIS — D509 Iron deficiency anemia, unspecified: Secondary | ICD-10-CM | POA: Diagnosis not present

## 2023-09-03 DIAGNOSIS — N189 Chronic kidney disease, unspecified: Secondary | ICD-10-CM | POA: Diagnosis not present

## 2023-09-06 DIAGNOSIS — D509 Iron deficiency anemia, unspecified: Secondary | ICD-10-CM | POA: Diagnosis not present

## 2023-09-06 DIAGNOSIS — Z992 Dependence on renal dialysis: Secondary | ICD-10-CM | POA: Diagnosis not present

## 2023-09-06 DIAGNOSIS — N2581 Secondary hyperparathyroidism of renal origin: Secondary | ICD-10-CM | POA: Diagnosis not present

## 2023-09-06 DIAGNOSIS — N186 End stage renal disease: Secondary | ICD-10-CM | POA: Diagnosis not present

## 2023-09-06 DIAGNOSIS — N189 Chronic kidney disease, unspecified: Secondary | ICD-10-CM | POA: Diagnosis not present

## 2023-09-06 DIAGNOSIS — R110 Nausea: Secondary | ICD-10-CM | POA: Diagnosis not present

## 2023-09-08 DIAGNOSIS — N186 End stage renal disease: Secondary | ICD-10-CM | POA: Diagnosis not present

## 2023-09-08 DIAGNOSIS — N189 Chronic kidney disease, unspecified: Secondary | ICD-10-CM | POA: Diagnosis not present

## 2023-09-08 DIAGNOSIS — Z992 Dependence on renal dialysis: Secondary | ICD-10-CM | POA: Diagnosis not present

## 2023-09-08 DIAGNOSIS — R110 Nausea: Secondary | ICD-10-CM | POA: Diagnosis not present

## 2023-09-08 DIAGNOSIS — D509 Iron deficiency anemia, unspecified: Secondary | ICD-10-CM | POA: Diagnosis not present

## 2023-09-08 DIAGNOSIS — N2581 Secondary hyperparathyroidism of renal origin: Secondary | ICD-10-CM | POA: Diagnosis not present

## 2023-09-09 ENCOUNTER — Ambulatory Visit

## 2023-09-09 NOTE — Progress Notes (Signed)
 Shoes and inserts are here from last year order from Dr Awanda expired 08/25/23 ppw signature from treating expired 08/17/23 and 6 mo visit with treating will exp on 9/24  Patient will set appt with Dr Awanda for new order shoes an inserts then they will stop to get ppw to take to Dr Haze  If signed before 9/24 we can dispense   Lolita Schultze CPed, CFo, CFm

## 2023-09-10 ENCOUNTER — Ambulatory Visit

## 2023-09-10 DIAGNOSIS — N186 End stage renal disease: Secondary | ICD-10-CM | POA: Diagnosis not present

## 2023-09-10 DIAGNOSIS — D509 Iron deficiency anemia, unspecified: Secondary | ICD-10-CM | POA: Diagnosis not present

## 2023-09-10 DIAGNOSIS — N189 Chronic kidney disease, unspecified: Secondary | ICD-10-CM | POA: Diagnosis not present

## 2023-09-10 DIAGNOSIS — Z992 Dependence on renal dialysis: Secondary | ICD-10-CM | POA: Diagnosis not present

## 2023-09-10 DIAGNOSIS — R110 Nausea: Secondary | ICD-10-CM | POA: Diagnosis not present

## 2023-09-10 DIAGNOSIS — N2581 Secondary hyperparathyroidism of renal origin: Secondary | ICD-10-CM | POA: Diagnosis not present

## 2023-09-13 DIAGNOSIS — N189 Chronic kidney disease, unspecified: Secondary | ICD-10-CM | POA: Diagnosis not present

## 2023-09-13 DIAGNOSIS — N2581 Secondary hyperparathyroidism of renal origin: Secondary | ICD-10-CM | POA: Diagnosis not present

## 2023-09-13 DIAGNOSIS — R110 Nausea: Secondary | ICD-10-CM | POA: Diagnosis not present

## 2023-09-13 DIAGNOSIS — D509 Iron deficiency anemia, unspecified: Secondary | ICD-10-CM | POA: Diagnosis not present

## 2023-09-13 DIAGNOSIS — N186 End stage renal disease: Secondary | ICD-10-CM | POA: Diagnosis not present

## 2023-09-13 DIAGNOSIS — Z992 Dependence on renal dialysis: Secondary | ICD-10-CM | POA: Diagnosis not present

## 2023-09-14 ENCOUNTER — Ambulatory Visit
Admission: RE | Admit: 2023-09-14 | Discharge: 2023-09-14 | Disposition: A | Discharge status: Home / Self Care | Source: Ambulatory Visit

## 2023-09-14 DIAGNOSIS — Z1231 Encounter for screening mammogram for malignant neoplasm of breast: Secondary | ICD-10-CM | POA: Diagnosis not present

## 2023-09-15 DIAGNOSIS — N2581 Secondary hyperparathyroidism of renal origin: Secondary | ICD-10-CM | POA: Diagnosis not present

## 2023-09-15 DIAGNOSIS — R110 Nausea: Secondary | ICD-10-CM | POA: Diagnosis not present

## 2023-09-15 DIAGNOSIS — D509 Iron deficiency anemia, unspecified: Secondary | ICD-10-CM | POA: Diagnosis not present

## 2023-09-15 DIAGNOSIS — N186 End stage renal disease: Secondary | ICD-10-CM | POA: Diagnosis not present

## 2023-09-15 DIAGNOSIS — Z992 Dependence on renal dialysis: Secondary | ICD-10-CM | POA: Diagnosis not present

## 2023-09-15 DIAGNOSIS — N189 Chronic kidney disease, unspecified: Secondary | ICD-10-CM | POA: Diagnosis not present

## 2023-09-16 DIAGNOSIS — Z780 Asymptomatic menopausal state: Secondary | ICD-10-CM | POA: Diagnosis not present

## 2023-09-16 DIAGNOSIS — M8588 Other specified disorders of bone density and structure, other site: Secondary | ICD-10-CM | POA: Diagnosis not present

## 2023-09-17 DIAGNOSIS — N186 End stage renal disease: Secondary | ICD-10-CM | POA: Diagnosis not present

## 2023-09-17 DIAGNOSIS — R110 Nausea: Secondary | ICD-10-CM | POA: Diagnosis not present

## 2023-09-17 DIAGNOSIS — Z992 Dependence on renal dialysis: Secondary | ICD-10-CM | POA: Diagnosis not present

## 2023-09-17 DIAGNOSIS — N2581 Secondary hyperparathyroidism of renal origin: Secondary | ICD-10-CM | POA: Diagnosis not present

## 2023-09-17 DIAGNOSIS — D509 Iron deficiency anemia, unspecified: Secondary | ICD-10-CM | POA: Diagnosis not present

## 2023-09-17 DIAGNOSIS — N189 Chronic kidney disease, unspecified: Secondary | ICD-10-CM | POA: Diagnosis not present

## 2023-09-20 DIAGNOSIS — N2581 Secondary hyperparathyroidism of renal origin: Secondary | ICD-10-CM | POA: Diagnosis not present

## 2023-09-20 DIAGNOSIS — R110 Nausea: Secondary | ICD-10-CM | POA: Diagnosis not present

## 2023-09-20 DIAGNOSIS — Z992 Dependence on renal dialysis: Secondary | ICD-10-CM | POA: Diagnosis not present

## 2023-09-20 DIAGNOSIS — N189 Chronic kidney disease, unspecified: Secondary | ICD-10-CM | POA: Diagnosis not present

## 2023-09-20 DIAGNOSIS — D509 Iron deficiency anemia, unspecified: Secondary | ICD-10-CM | POA: Diagnosis not present

## 2023-09-20 DIAGNOSIS — N186 End stage renal disease: Secondary | ICD-10-CM | POA: Diagnosis not present

## 2023-09-22 DIAGNOSIS — R110 Nausea: Secondary | ICD-10-CM | POA: Diagnosis not present

## 2023-09-22 DIAGNOSIS — N189 Chronic kidney disease, unspecified: Secondary | ICD-10-CM | POA: Diagnosis not present

## 2023-09-22 DIAGNOSIS — N186 End stage renal disease: Secondary | ICD-10-CM | POA: Diagnosis not present

## 2023-09-22 DIAGNOSIS — N2581 Secondary hyperparathyroidism of renal origin: Secondary | ICD-10-CM | POA: Diagnosis not present

## 2023-09-22 DIAGNOSIS — D509 Iron deficiency anemia, unspecified: Secondary | ICD-10-CM | POA: Diagnosis not present

## 2023-09-22 DIAGNOSIS — Z992 Dependence on renal dialysis: Secondary | ICD-10-CM | POA: Diagnosis not present

## 2023-09-24 DIAGNOSIS — E1122 Type 2 diabetes mellitus with diabetic chronic kidney disease: Secondary | ICD-10-CM | POA: Diagnosis not present

## 2023-09-24 DIAGNOSIS — N186 End stage renal disease: Secondary | ICD-10-CM | POA: Diagnosis not present

## 2023-09-24 DIAGNOSIS — E1129 Type 2 diabetes mellitus with other diabetic kidney complication: Secondary | ICD-10-CM | POA: Diagnosis not present

## 2023-09-24 DIAGNOSIS — N189 Chronic kidney disease, unspecified: Secondary | ICD-10-CM | POA: Diagnosis not present

## 2023-09-24 DIAGNOSIS — R110 Nausea: Secondary | ICD-10-CM | POA: Diagnosis not present

## 2023-09-24 DIAGNOSIS — Z992 Dependence on renal dialysis: Secondary | ICD-10-CM | POA: Diagnosis not present

## 2023-09-24 DIAGNOSIS — D631 Anemia in chronic kidney disease: Secondary | ICD-10-CM | POA: Diagnosis not present

## 2023-09-24 DIAGNOSIS — N2581 Secondary hyperparathyroidism of renal origin: Secondary | ICD-10-CM | POA: Diagnosis not present

## 2023-09-24 DIAGNOSIS — D509 Iron deficiency anemia, unspecified: Secondary | ICD-10-CM | POA: Diagnosis not present

## 2023-09-27 DIAGNOSIS — N2581 Secondary hyperparathyroidism of renal origin: Secondary | ICD-10-CM | POA: Diagnosis not present

## 2023-09-27 DIAGNOSIS — N186 End stage renal disease: Secondary | ICD-10-CM | POA: Diagnosis not present

## 2023-09-27 DIAGNOSIS — N189 Chronic kidney disease, unspecified: Secondary | ICD-10-CM | POA: Diagnosis not present

## 2023-09-27 DIAGNOSIS — R110 Nausea: Secondary | ICD-10-CM | POA: Diagnosis not present

## 2023-09-27 DIAGNOSIS — D509 Iron deficiency anemia, unspecified: Secondary | ICD-10-CM | POA: Diagnosis not present

## 2023-09-27 DIAGNOSIS — Z992 Dependence on renal dialysis: Secondary | ICD-10-CM | POA: Diagnosis not present

## 2023-09-28 ENCOUNTER — Ambulatory Visit: Admitting: Podiatry

## 2023-09-30 DIAGNOSIS — N189 Chronic kidney disease, unspecified: Secondary | ICD-10-CM | POA: Diagnosis not present

## 2023-09-30 DIAGNOSIS — N2581 Secondary hyperparathyroidism of renal origin: Secondary | ICD-10-CM | POA: Diagnosis not present

## 2023-09-30 DIAGNOSIS — R110 Nausea: Secondary | ICD-10-CM | POA: Diagnosis not present

## 2023-09-30 DIAGNOSIS — D509 Iron deficiency anemia, unspecified: Secondary | ICD-10-CM | POA: Diagnosis not present

## 2023-09-30 DIAGNOSIS — N186 End stage renal disease: Secondary | ICD-10-CM | POA: Diagnosis not present

## 2023-09-30 DIAGNOSIS — Z992 Dependence on renal dialysis: Secondary | ICD-10-CM | POA: Diagnosis not present

## 2023-10-01 DIAGNOSIS — Z992 Dependence on renal dialysis: Secondary | ICD-10-CM | POA: Diagnosis not present

## 2023-10-01 DIAGNOSIS — N189 Chronic kidney disease, unspecified: Secondary | ICD-10-CM | POA: Diagnosis not present

## 2023-10-01 DIAGNOSIS — R110 Nausea: Secondary | ICD-10-CM | POA: Diagnosis not present

## 2023-10-01 DIAGNOSIS — N2581 Secondary hyperparathyroidism of renal origin: Secondary | ICD-10-CM | POA: Diagnosis not present

## 2023-10-01 DIAGNOSIS — D509 Iron deficiency anemia, unspecified: Secondary | ICD-10-CM | POA: Diagnosis not present

## 2023-10-01 DIAGNOSIS — N186 End stage renal disease: Secondary | ICD-10-CM | POA: Diagnosis not present

## 2023-10-04 DIAGNOSIS — R110 Nausea: Secondary | ICD-10-CM | POA: Diagnosis not present

## 2023-10-04 DIAGNOSIS — N2581 Secondary hyperparathyroidism of renal origin: Secondary | ICD-10-CM | POA: Diagnosis not present

## 2023-10-04 DIAGNOSIS — N189 Chronic kidney disease, unspecified: Secondary | ICD-10-CM | POA: Diagnosis not present

## 2023-10-04 DIAGNOSIS — D509 Iron deficiency anemia, unspecified: Secondary | ICD-10-CM | POA: Diagnosis not present

## 2023-10-04 DIAGNOSIS — N186 End stage renal disease: Secondary | ICD-10-CM | POA: Diagnosis not present

## 2023-10-04 DIAGNOSIS — Z992 Dependence on renal dialysis: Secondary | ICD-10-CM | POA: Diagnosis not present

## 2023-10-05 ENCOUNTER — Ambulatory Visit (INDEPENDENT_AMBULATORY_CARE_PROVIDER_SITE_OTHER): Admitting: Podiatry

## 2023-10-05 ENCOUNTER — Encounter: Payer: Self-pay | Admitting: Podiatry

## 2023-10-05 DIAGNOSIS — E1151 Type 2 diabetes mellitus with diabetic peripheral angiopathy without gangrene: Secondary | ICD-10-CM

## 2023-10-05 DIAGNOSIS — Z89419 Acquired absence of unspecified great toe: Secondary | ICD-10-CM

## 2023-10-05 NOTE — Progress Notes (Signed)
 Patient was given ppw / today to take to Dr. Haze, once received patient can set delivery appt to take shoes  Lolita Schultze CPed, CFo, CFm

## 2023-10-05 NOTE — Progress Notes (Signed)
 Chief Complaint  Patient presents with   Diabetes    They are here for diabetic shoe.  Reports no pain except when it rains   HPI: 73 y.o. female presents today noting that she received a call stating that her diabetic shoes were in.  They are here to pick up the diabetic shoes.  They did not schedule an appointment for any type of foot evaluation today.  Past Medical History:  Diagnosis Date   Anemia    patient preference - stopped iron     ARF (acute renal failure) (HCC) 04/2016   dehydration   Chronic kidney disease    stage 5   DDD (degenerative disc disease), lumbar    Depression    Diabetes mellitus    Type II - pt preference - stopped lantus   Diabetic retinopathy (HCC)    Diverticulitis    History of kidney stones    passed- 6   Hypertension    Lacunar infarction (HCC)    Osteomyelitis (HCC)    Polyneuropathy    Vitamin B 12 deficiency 06/09/2017   Vitamin D deficiency    Wears partial dentures    upper   Past Surgical History:  Procedure Laterality Date   ABDOMINAL AORTOGRAM W/LOWER EXTREMITY Right 02/11/2023   Procedure: ABDOMINAL AORTOGRAM W/LOWER EXTREMITY;  Surgeon: Gretta Lonni PARAS, MD;  Location: [facility];  Service: Cardiovascular;  Laterality: Right;   AMPUTATION Right 08/14/2016   Procedure: RIGHT 1ST RAY AMPUTATION MID-SHAFT;  Surgeon: Harden Jerona GAILS, MD;  Location: [facility] OR;  Service: Orthopedics;  Laterality: Right;   BASCILIC VEIN TRANSPOSITION Left 06/08/2019   Procedure: LEFT BRACHIOCEPHALIC VEIN CREATION;  Surgeon: Gretta Lonni PARAS, MD;  Location: [facility] OR;  Service: Vascular;  Laterality: Left;   CATARACT EXTRACTION  2018   INCISION AND DRAINAGE ABSCESS Left 02/11/2014   Procedure: INCISION AND DRAINAGE ABSCESS Left Buttock;  Surgeon: Krystal Russell, MD;  Location: [facility];  Service: General;  Laterality: Left;   INSERTION OF DIALYSIS CATHETER Left 09/07/2019   Procedure: INSERTION OF LEFT INTERNAL JUGULAR DIALYSIS CATHETER;   Surgeon: Oris Krystal FALCON, MD;  Location: [facility] OR;  Service: Vascular;  Laterality: Left;   IR REMOVAL TUN CV CATH W/O FL  09/04/2019   LAPAROSCOPIC SMALL BOWEL RESECTION N/A 05/09/2016   Procedure: LAPAROSCOPIC SMALL BOWEL RESECTION;  Surgeon: Elspeth Schultze, MD;  Location: [facility];  Service: General;  Laterality: N/A;   LAPAROSCOPY N/A 05/09/2016   Procedure: LAPAROSCOPY DIAGNOSTIC, LYSIS OF ADHESIONS, SMALL BOWEL RESECTION X 2;  Surgeon: Elspeth Schultze, MD;  Location: [facility];  Service: General;  Laterality: N/A;   TOE AMPUTATION     left foot great toe   VENOUS ANGIOPLASTY  07/22/2023   Procedure: VENOUS ANGIOPLASTY;  Surgeon: Gretta Lonni PARAS, MD;  Location: [facility] LAB;  Service: Cardiovascular;;  cephalic arch   No Known Allergies   Physical Exam: Not performed, since the patient only wants her shoes right now.  Assessment/Plan of Care: 1. Type II diabetes mellitus with peripheral circulatory disorder (HCC)   2. History of amputation of hallux (HCC)     We reached out to the pedorthist to get further clarification as to what the patient was scheduled with the clinician for.  She stated that they have been scheduling patients to come in with their PCP has not completed the certifying statement forms.  If we give them directly to the patient and asked them to have them signed, they seem to have been getting  the paperwork completed so that they can be dispensed for shoes.  The patient was dispensed the paperwork and instructed to go straight to her PCP office to have this signed and then bring directly back to our office so that she can get her diabetic shoes either today or possibly Wednesday.   Awanda CHARM Imperial, DPM, FACFAS [facility]     [address] [place], [place] [address]                Office [phone]  Fax [phone]

## 2023-10-06 DIAGNOSIS — D509 Iron deficiency anemia, unspecified: Secondary | ICD-10-CM | POA: Diagnosis not present

## 2023-10-06 DIAGNOSIS — R110 Nausea: Secondary | ICD-10-CM | POA: Diagnosis not present

## 2023-10-06 DIAGNOSIS — N2581 Secondary hyperparathyroidism of renal origin: Secondary | ICD-10-CM | POA: Diagnosis not present

## 2023-10-06 DIAGNOSIS — N186 End stage renal disease: Secondary | ICD-10-CM | POA: Diagnosis not present

## 2023-10-06 DIAGNOSIS — Z992 Dependence on renal dialysis: Secondary | ICD-10-CM | POA: Diagnosis not present

## 2023-10-06 DIAGNOSIS — N189 Chronic kidney disease, unspecified: Secondary | ICD-10-CM | POA: Diagnosis not present

## 2023-10-07 ENCOUNTER — Ambulatory Visit (INDEPENDENT_AMBULATORY_CARE_PROVIDER_SITE_OTHER)

## 2023-10-07 DIAGNOSIS — E1151 Type 2 diabetes mellitus with diabetic peripheral angiopathy without gangrene: Secondary | ICD-10-CM

## 2023-10-07 DIAGNOSIS — M21961 Unspecified acquired deformity of right lower leg: Secondary | ICD-10-CM | POA: Diagnosis not present

## 2023-10-07 DIAGNOSIS — L84 Corns and callosities: Secondary | ICD-10-CM

## 2023-10-07 DIAGNOSIS — Z89419 Acquired absence of unspecified great toe: Secondary | ICD-10-CM

## 2023-10-07 NOTE — Progress Notes (Signed)

## 2023-10-08 DIAGNOSIS — N189 Chronic kidney disease, unspecified: Secondary | ICD-10-CM | POA: Diagnosis not present

## 2023-10-08 DIAGNOSIS — N2581 Secondary hyperparathyroidism of renal origin: Secondary | ICD-10-CM | POA: Diagnosis not present

## 2023-10-08 DIAGNOSIS — N186 End stage renal disease: Secondary | ICD-10-CM | POA: Diagnosis not present

## 2023-10-08 DIAGNOSIS — D509 Iron deficiency anemia, unspecified: Secondary | ICD-10-CM | POA: Diagnosis not present

## 2023-10-08 DIAGNOSIS — R110 Nausea: Secondary | ICD-10-CM | POA: Diagnosis not present

## 2023-10-08 DIAGNOSIS — Z992 Dependence on renal dialysis: Secondary | ICD-10-CM | POA: Diagnosis not present

## 2023-10-11 DIAGNOSIS — R110 Nausea: Secondary | ICD-10-CM | POA: Diagnosis not present

## 2023-10-11 DIAGNOSIS — Z992 Dependence on renal dialysis: Secondary | ICD-10-CM | POA: Diagnosis not present

## 2023-10-11 DIAGNOSIS — D509 Iron deficiency anemia, unspecified: Secondary | ICD-10-CM | POA: Diagnosis not present

## 2023-10-11 DIAGNOSIS — N189 Chronic kidney disease, unspecified: Secondary | ICD-10-CM | POA: Diagnosis not present

## 2023-10-11 DIAGNOSIS — N186 End stage renal disease: Secondary | ICD-10-CM | POA: Diagnosis not present

## 2023-10-11 DIAGNOSIS — N2581 Secondary hyperparathyroidism of renal origin: Secondary | ICD-10-CM | POA: Diagnosis not present

## 2023-10-13 DIAGNOSIS — R110 Nausea: Secondary | ICD-10-CM | POA: Diagnosis not present

## 2023-10-13 DIAGNOSIS — N186 End stage renal disease: Secondary | ICD-10-CM | POA: Diagnosis not present

## 2023-10-13 DIAGNOSIS — E1122 Type 2 diabetes mellitus with diabetic chronic kidney disease: Secondary | ICD-10-CM | POA: Diagnosis not present

## 2023-10-13 DIAGNOSIS — D509 Iron deficiency anemia, unspecified: Secondary | ICD-10-CM | POA: Diagnosis not present

## 2023-10-13 DIAGNOSIS — N189 Chronic kidney disease, unspecified: Secondary | ICD-10-CM | POA: Diagnosis not present

## 2023-10-13 DIAGNOSIS — N2581 Secondary hyperparathyroidism of renal origin: Secondary | ICD-10-CM | POA: Diagnosis not present

## 2023-10-13 DIAGNOSIS — Z992 Dependence on renal dialysis: Secondary | ICD-10-CM | POA: Diagnosis not present

## 2023-10-15 DIAGNOSIS — N2581 Secondary hyperparathyroidism of renal origin: Secondary | ICD-10-CM | POA: Diagnosis not present

## 2023-10-15 DIAGNOSIS — N189 Chronic kidney disease, unspecified: Secondary | ICD-10-CM | POA: Diagnosis not present

## 2023-10-15 DIAGNOSIS — N186 End stage renal disease: Secondary | ICD-10-CM | POA: Diagnosis not present

## 2023-10-15 DIAGNOSIS — R110 Nausea: Secondary | ICD-10-CM | POA: Diagnosis not present

## 2023-10-15 DIAGNOSIS — D509 Iron deficiency anemia, unspecified: Secondary | ICD-10-CM | POA: Diagnosis not present

## 2023-10-15 DIAGNOSIS — Z992 Dependence on renal dialysis: Secondary | ICD-10-CM | POA: Diagnosis not present

## 2023-10-18 DIAGNOSIS — N189 Chronic kidney disease, unspecified: Secondary | ICD-10-CM | POA: Diagnosis not present

## 2023-10-18 DIAGNOSIS — Z992 Dependence on renal dialysis: Secondary | ICD-10-CM | POA: Diagnosis not present

## 2023-10-18 DIAGNOSIS — N2581 Secondary hyperparathyroidism of renal origin: Secondary | ICD-10-CM | POA: Diagnosis not present

## 2023-10-18 DIAGNOSIS — D509 Iron deficiency anemia, unspecified: Secondary | ICD-10-CM | POA: Diagnosis not present

## 2023-10-18 DIAGNOSIS — N186 End stage renal disease: Secondary | ICD-10-CM | POA: Diagnosis not present

## 2023-10-18 DIAGNOSIS — R110 Nausea: Secondary | ICD-10-CM | POA: Diagnosis not present

## 2023-10-21 DIAGNOSIS — Z992 Dependence on renal dialysis: Secondary | ICD-10-CM | POA: Diagnosis not present

## 2023-10-21 DIAGNOSIS — N189 Chronic kidney disease, unspecified: Secondary | ICD-10-CM | POA: Diagnosis not present

## 2023-10-21 DIAGNOSIS — N2581 Secondary hyperparathyroidism of renal origin: Secondary | ICD-10-CM | POA: Diagnosis not present

## 2023-10-21 DIAGNOSIS — N186 End stage renal disease: Secondary | ICD-10-CM | POA: Diagnosis not present

## 2023-10-21 DIAGNOSIS — D509 Iron deficiency anemia, unspecified: Secondary | ICD-10-CM | POA: Diagnosis not present

## 2023-10-21 DIAGNOSIS — R110 Nausea: Secondary | ICD-10-CM | POA: Diagnosis not present

## 2023-10-22 DIAGNOSIS — Z992 Dependence on renal dialysis: Secondary | ICD-10-CM | POA: Diagnosis not present

## 2023-10-22 DIAGNOSIS — R110 Nausea: Secondary | ICD-10-CM | POA: Diagnosis not present

## 2023-10-22 DIAGNOSIS — N189 Chronic kidney disease, unspecified: Secondary | ICD-10-CM | POA: Diagnosis not present

## 2023-10-22 DIAGNOSIS — N2581 Secondary hyperparathyroidism of renal origin: Secondary | ICD-10-CM | POA: Diagnosis not present

## 2023-10-22 DIAGNOSIS — N186 End stage renal disease: Secondary | ICD-10-CM | POA: Diagnosis not present

## 2023-10-22 DIAGNOSIS — D509 Iron deficiency anemia, unspecified: Secondary | ICD-10-CM | POA: Diagnosis not present

## 2023-10-25 DIAGNOSIS — D509 Iron deficiency anemia, unspecified: Secondary | ICD-10-CM | POA: Diagnosis not present

## 2023-10-25 DIAGNOSIS — D631 Anemia in chronic kidney disease: Secondary | ICD-10-CM | POA: Diagnosis not present

## 2023-10-25 DIAGNOSIS — N186 End stage renal disease: Secondary | ICD-10-CM | POA: Diagnosis not present

## 2023-10-25 DIAGNOSIS — N189 Chronic kidney disease, unspecified: Secondary | ICD-10-CM | POA: Diagnosis not present

## 2023-10-25 DIAGNOSIS — R110 Nausea: Secondary | ICD-10-CM | POA: Diagnosis not present

## 2023-10-25 DIAGNOSIS — E1129 Type 2 diabetes mellitus with other diabetic kidney complication: Secondary | ICD-10-CM | POA: Diagnosis not present

## 2023-10-25 DIAGNOSIS — N2581 Secondary hyperparathyroidism of renal origin: Secondary | ICD-10-CM | POA: Diagnosis not present

## 2023-10-25 DIAGNOSIS — E1122 Type 2 diabetes mellitus with diabetic chronic kidney disease: Secondary | ICD-10-CM | POA: Diagnosis not present

## 2023-10-25 DIAGNOSIS — Z992 Dependence on renal dialysis: Secondary | ICD-10-CM | POA: Diagnosis not present

## 2023-10-27 DIAGNOSIS — N186 End stage renal disease: Secondary | ICD-10-CM | POA: Diagnosis not present

## 2023-10-27 DIAGNOSIS — Z992 Dependence on renal dialysis: Secondary | ICD-10-CM | POA: Diagnosis not present

## 2023-10-27 DIAGNOSIS — N189 Chronic kidney disease, unspecified: Secondary | ICD-10-CM | POA: Diagnosis not present

## 2023-10-27 DIAGNOSIS — N2581 Secondary hyperparathyroidism of renal origin: Secondary | ICD-10-CM | POA: Diagnosis not present

## 2023-10-27 DIAGNOSIS — D509 Iron deficiency anemia, unspecified: Secondary | ICD-10-CM | POA: Diagnosis not present

## 2023-10-27 DIAGNOSIS — R110 Nausea: Secondary | ICD-10-CM | POA: Diagnosis not present

## 2023-10-29 DIAGNOSIS — N186 End stage renal disease: Secondary | ICD-10-CM | POA: Diagnosis not present

## 2023-10-29 DIAGNOSIS — R110 Nausea: Secondary | ICD-10-CM | POA: Diagnosis not present

## 2023-10-29 DIAGNOSIS — N189 Chronic kidney disease, unspecified: Secondary | ICD-10-CM | POA: Diagnosis not present

## 2023-10-29 DIAGNOSIS — D509 Iron deficiency anemia, unspecified: Secondary | ICD-10-CM | POA: Diagnosis not present

## 2023-10-29 DIAGNOSIS — Z992 Dependence on renal dialysis: Secondary | ICD-10-CM | POA: Diagnosis not present

## 2023-10-29 DIAGNOSIS — N2581 Secondary hyperparathyroidism of renal origin: Secondary | ICD-10-CM | POA: Diagnosis not present

## 2023-11-01 DIAGNOSIS — R110 Nausea: Secondary | ICD-10-CM | POA: Diagnosis not present

## 2023-11-01 DIAGNOSIS — N189 Chronic kidney disease, unspecified: Secondary | ICD-10-CM | POA: Diagnosis not present

## 2023-11-01 DIAGNOSIS — Z992 Dependence on renal dialysis: Secondary | ICD-10-CM | POA: Diagnosis not present

## 2023-11-01 DIAGNOSIS — N186 End stage renal disease: Secondary | ICD-10-CM | POA: Diagnosis not present

## 2023-11-01 DIAGNOSIS — N2581 Secondary hyperparathyroidism of renal origin: Secondary | ICD-10-CM | POA: Diagnosis not present

## 2023-11-01 DIAGNOSIS — D509 Iron deficiency anemia, unspecified: Secondary | ICD-10-CM | POA: Diagnosis not present

## 2023-11-03 DIAGNOSIS — N189 Chronic kidney disease, unspecified: Secondary | ICD-10-CM | POA: Diagnosis not present

## 2023-11-03 DIAGNOSIS — N2581 Secondary hyperparathyroidism of renal origin: Secondary | ICD-10-CM | POA: Diagnosis not present

## 2023-11-03 DIAGNOSIS — R110 Nausea: Secondary | ICD-10-CM | POA: Diagnosis not present

## 2023-11-03 DIAGNOSIS — N186 End stage renal disease: Secondary | ICD-10-CM | POA: Diagnosis not present

## 2023-11-03 DIAGNOSIS — D509 Iron deficiency anemia, unspecified: Secondary | ICD-10-CM | POA: Diagnosis not present

## 2023-11-03 DIAGNOSIS — Z992 Dependence on renal dialysis: Secondary | ICD-10-CM | POA: Diagnosis not present

## 2023-11-05 DIAGNOSIS — N2581 Secondary hyperparathyroidism of renal origin: Secondary | ICD-10-CM | POA: Diagnosis not present

## 2023-11-05 DIAGNOSIS — Z992 Dependence on renal dialysis: Secondary | ICD-10-CM | POA: Diagnosis not present

## 2023-11-05 DIAGNOSIS — R110 Nausea: Secondary | ICD-10-CM | POA: Diagnosis not present

## 2023-11-05 DIAGNOSIS — N186 End stage renal disease: Secondary | ICD-10-CM | POA: Diagnosis not present

## 2023-11-05 DIAGNOSIS — N189 Chronic kidney disease, unspecified: Secondary | ICD-10-CM | POA: Diagnosis not present

## 2023-11-05 DIAGNOSIS — D509 Iron deficiency anemia, unspecified: Secondary | ICD-10-CM | POA: Diagnosis not present

## 2023-11-08 DIAGNOSIS — N2581 Secondary hyperparathyroidism of renal origin: Secondary | ICD-10-CM | POA: Diagnosis not present

## 2023-11-08 DIAGNOSIS — N186 End stage renal disease: Secondary | ICD-10-CM | POA: Diagnosis not present

## 2023-11-08 DIAGNOSIS — R110 Nausea: Secondary | ICD-10-CM | POA: Diagnosis not present

## 2023-11-08 DIAGNOSIS — Z992 Dependence on renal dialysis: Secondary | ICD-10-CM | POA: Diagnosis not present

## 2023-11-08 DIAGNOSIS — D509 Iron deficiency anemia, unspecified: Secondary | ICD-10-CM | POA: Diagnosis not present

## 2023-11-08 DIAGNOSIS — N189 Chronic kidney disease, unspecified: Secondary | ICD-10-CM | POA: Diagnosis not present

## 2023-11-10 DIAGNOSIS — Z992 Dependence on renal dialysis: Secondary | ICD-10-CM | POA: Diagnosis not present

## 2023-11-10 DIAGNOSIS — N2581 Secondary hyperparathyroidism of renal origin: Secondary | ICD-10-CM | POA: Diagnosis not present

## 2023-11-10 DIAGNOSIS — N189 Chronic kidney disease, unspecified: Secondary | ICD-10-CM | POA: Diagnosis not present

## 2023-11-10 DIAGNOSIS — N186 End stage renal disease: Secondary | ICD-10-CM | POA: Diagnosis not present

## 2023-11-10 DIAGNOSIS — D509 Iron deficiency anemia, unspecified: Secondary | ICD-10-CM | POA: Diagnosis not present

## 2023-11-10 DIAGNOSIS — R110 Nausea: Secondary | ICD-10-CM | POA: Diagnosis not present

## 2023-11-12 DIAGNOSIS — D509 Iron deficiency anemia, unspecified: Secondary | ICD-10-CM | POA: Diagnosis not present

## 2023-11-12 DIAGNOSIS — N2581 Secondary hyperparathyroidism of renal origin: Secondary | ICD-10-CM | POA: Diagnosis not present

## 2023-11-12 DIAGNOSIS — N186 End stage renal disease: Secondary | ICD-10-CM | POA: Diagnosis not present

## 2023-11-12 DIAGNOSIS — Z992 Dependence on renal dialysis: Secondary | ICD-10-CM | POA: Diagnosis not present

## 2023-11-12 DIAGNOSIS — N189 Chronic kidney disease, unspecified: Secondary | ICD-10-CM | POA: Diagnosis not present

## 2023-11-12 DIAGNOSIS — R110 Nausea: Secondary | ICD-10-CM | POA: Diagnosis not present

## 2023-11-15 ENCOUNTER — Emergency Department (HOSPITAL_COMMUNITY)

## 2023-11-15 ENCOUNTER — Emergency Department (HOSPITAL_COMMUNITY)
Admission: EM | Admit: 2023-11-15 | Discharge: 2023-11-15 | Disposition: A | Discharge status: Home / Self Care | Attending: Emergency Medicine | Admitting: Emergency Medicine

## 2023-11-15 ENCOUNTER — Encounter (HOSPITAL_COMMUNITY): Payer: Self-pay

## 2023-11-15 DIAGNOSIS — F0390 Unspecified dementia without behavioral disturbance: Secondary | ICD-10-CM | POA: Insufficient documentation

## 2023-11-15 DIAGNOSIS — Z79899 Other long term (current) drug therapy: Secondary | ICD-10-CM | POA: Diagnosis not present

## 2023-11-15 DIAGNOSIS — Z992 Dependence on renal dialysis: Secondary | ICD-10-CM | POA: Diagnosis not present

## 2023-11-15 DIAGNOSIS — M533 Sacrococcygeal disorders, not elsewhere classified: Secondary | ICD-10-CM | POA: Diagnosis not present

## 2023-11-15 DIAGNOSIS — W19XXXA Unspecified fall, initial encounter: Secondary | ICD-10-CM

## 2023-11-15 DIAGNOSIS — N2581 Secondary hyperparathyroidism of renal origin: Secondary | ICD-10-CM | POA: Diagnosis not present

## 2023-11-15 DIAGNOSIS — M47816 Spondylosis without myelopathy or radiculopathy, lumbar region: Secondary | ICD-10-CM | POA: Diagnosis not present

## 2023-11-15 DIAGNOSIS — M8580 Other specified disorders of bone density and structure, unspecified site: Secondary | ICD-10-CM | POA: Diagnosis not present

## 2023-11-15 DIAGNOSIS — Z8673 Personal history of transient ischemic attack (TIA), and cerebral infarction without residual deficits: Secondary | ICD-10-CM | POA: Insufficient documentation

## 2023-11-15 DIAGNOSIS — R110 Nausea: Secondary | ICD-10-CM | POA: Diagnosis not present

## 2023-11-15 DIAGNOSIS — R7989 Other specified abnormal findings of blood chemistry: Secondary | ICD-10-CM | POA: Insufficient documentation

## 2023-11-15 DIAGNOSIS — R609 Edema, unspecified: Secondary | ICD-10-CM | POA: Diagnosis not present

## 2023-11-15 DIAGNOSIS — I120 Hypertensive chronic kidney disease with stage 5 chronic kidney disease or end stage renal disease: Secondary | ICD-10-CM | POA: Diagnosis not present

## 2023-11-15 DIAGNOSIS — N186 End stage renal disease: Secondary | ICD-10-CM | POA: Diagnosis not present

## 2023-11-15 DIAGNOSIS — Z7982 Long term (current) use of aspirin: Secondary | ICD-10-CM | POA: Diagnosis not present

## 2023-11-15 DIAGNOSIS — E1122 Type 2 diabetes mellitus with diabetic chronic kidney disease: Secondary | ICD-10-CM | POA: Insufficient documentation

## 2023-11-15 DIAGNOSIS — Z043 Encounter for examination and observation following other accident: Secondary | ICD-10-CM | POA: Diagnosis not present

## 2023-11-15 DIAGNOSIS — D509 Iron deficiency anemia, unspecified: Secondary | ICD-10-CM | POA: Diagnosis not present

## 2023-11-15 DIAGNOSIS — W1830XA Fall on same level, unspecified, initial encounter: Secondary | ICD-10-CM | POA: Diagnosis not present

## 2023-11-15 DIAGNOSIS — M47817 Spondylosis without myelopathy or radiculopathy, lumbosacral region: Secondary | ICD-10-CM | POA: Diagnosis not present

## 2023-11-15 DIAGNOSIS — M545 Low back pain, unspecified: Secondary | ICD-10-CM | POA: Diagnosis present

## 2023-11-15 DIAGNOSIS — M25552 Pain in left hip: Secondary | ICD-10-CM | POA: Diagnosis not present

## 2023-11-15 DIAGNOSIS — N189 Chronic kidney disease, unspecified: Secondary | ICD-10-CM | POA: Diagnosis not present

## 2023-11-15 DIAGNOSIS — K5730 Diverticulosis of large intestine without perforation or abscess without bleeding: Secondary | ICD-10-CM | POA: Diagnosis not present

## 2023-11-15 LAB — CBC
HCT: 37.9 % (ref 36.0–46.0)
Hemoglobin: 11.8 g/dL — ABNORMAL LOW (ref 12.0–15.0)
MCH: 31 pg (ref 26.0–34.0)
MCHC: 31.1 g/dL (ref 30.0–36.0)
MCV: 99.5 fL (ref 80.0–100.0)
Platelets: 114 K/uL — ABNORMAL LOW (ref 150–400)
RBC: 3.81 MIL/uL — ABNORMAL LOW (ref 3.87–5.11)
RDW: 16.8 % — ABNORMAL HIGH (ref 11.5–15.5)
WBC: 6 K/uL (ref 4.0–10.5)
nRBC: 0 % (ref 0.0–0.2)

## 2023-11-15 LAB — BASIC METABOLIC PANEL WITH GFR
Anion gap: 15 (ref 5–15)
BUN: 36 mg/dL — ABNORMAL HIGH (ref 8–23)
CO2: 27 mmol/L (ref 22–32)
Calcium: 9.3 mg/dL (ref 8.9–10.3)
Chloride: 95 mmol/L — ABNORMAL LOW (ref 98–111)
Creatinine, Ser: 4.47 mg/dL — ABNORMAL HIGH (ref 0.44–1.00)
GFR, Estimated: 10 mL/min — ABNORMAL LOW
Glucose, Bld: 114 mg/dL — ABNORMAL HIGH (ref 70–99)
Potassium: 3.9 mmol/L (ref 3.5–5.1)
Sodium: 137 mmol/L (ref 135–145)

## 2023-11-15 NOTE — ED Triage Notes (Signed)
 Patient BIB [facility] from home, fell 6 days ago, has sacral and L hip pain with standing, rotation. Hx of dementia, lives at home with husband.  BP 122/70 HR 60 RR 16 98% RA CBG 105

## 2023-11-15 NOTE — Discharge Instructions (Signed)
 The x-rays did not show any signs of broken bones in the spine, hips or pelvis.  Take over-the-counter medication such as Tylenol  as needed for pain and discomfort

## 2023-11-15 NOTE — ED Provider Notes (Signed)
 [place] EMERGENCY DEPARTMENT AT [facility] Provider Note   CSN: [id number] Arrival date & time: 11/15/23  1459     Patient presents with: [place]   Bailey [name] is a 73 y.o. female.    Fall     Patient has a history of dementia diverticulitis osteomyelitis chronic kidney disease hypertension diabetes, prior strokes.  Patient presented to the ED for evaluation after a fall.  Per the family patient fell approximately 6 days ago.  This was not witnessed.  Patient lives at home with her husband.  She has been having some intermittent pain in her lower back and hip area.  No fevers or chills.  No vomiting or diarrhea  Prior to Admission medications   Medication Sig Start Date End Date Taking? Authorizing Provider  amLODipine  (NORVASC ) 10 MG tablet Take 10 mg by mouth daily.    [provider]  ascorbic acid (VITAMIN C) 500 MG tablet Take 500 mg by mouth daily.    [provider]  aspirin  EC 81 MG tablet Take 1 tablet (81 mg total) by mouth daily. Swallow whole. 10/02/22   Rojelio Nest, DO  atorvastatin  (LIPITOR) 40 MG tablet TAKE 1 TABLET(40 MG) BY MOUTH DAILY 09/01/22   Rush Nest, MD  AURYXIA  1 GM 210 MG(Fe) tablet Take 210 mg by mouth 3 (three) times daily with meals. 02/06/21   [provider]  carvedilol  (COREG ) 6.25 MG tablet Take 3.125 mg by mouth 2 (two) times daily. 01/22/23   [provider]  donepezil  (ARICEPT ) 5 MG tablet Take 1/2 pill daily for 4 weeks, then increase to 1 pill daily Patient taking differently: Take 5 mg by mouth every evening. 12/22/21   Rush Nest, MD  multivitamin (RENA-VIT) TABS tablet Take 1 tablet by mouth daily.    [provider]  olmesartan  (BENICAR ) 40 MG tablet Take 1/2 tablet (20 mg total) by mouth daily. Patient not taking: Reported on 02/05/2023 07/29/22       Allergies: Patient has no known allergies.    Review of Systems  Updated Vital Signs BP (!) 145/53 (BP Location:  Right Arm)   Pulse 62   Temp 97.7 F (36.5 C) (Oral)   Resp 18   LMP  (LMP Unknown)   SpO2 97%   Physical Exam Vitals and nursing note reviewed.  Constitutional:      General: She is not in acute distress.    Appearance: She is well-developed.  HENT:     Head: Normocephalic and atraumatic.     Right Ear: External ear normal.     Left Ear: External ear normal.  Eyes:     General: No scleral icterus.       Right eye: No discharge.        Left eye: No discharge.     Conjunctiva/sclera: Conjunctivae normal.  Neck:     Trachea: No tracheal deviation.  Cardiovascular:     Rate and Rhythm: Normal rate and regular rhythm.  Pulmonary:     Effort: Pulmonary effort is normal. No respiratory distress.     Breath sounds: Normal breath sounds. No stridor. No wheezing or rales.  Abdominal:     General: Bowel sounds are normal. There is no distension.     Palpations: Abdomen is soft.     Tenderness: There is no abdominal tenderness. There is no guarding or rebound.  Musculoskeletal:        General: No deformity.     Cervical back: Normal and  neck supple. No tenderness.     Thoracic back: Normal. No tenderness.     Lumbar back: Tenderness present.     Comments: Full range of motion bilateral upper extremities lower extremities without any tenderness or discomfort,  Skin:    General: Skin is warm and dry.     Findings: No rash.  Neurological:     General: No focal deficit present.     Mental Status: She is alert.     Cranial Nerves: No cranial nerve deficit, dysarthria or facial asymmetry.     Sensory: No sensory deficit.     Motor: No abnormal muscle tone or seizure activity.     Coordination: Coordination normal.  Psychiatric:        Mood and Affect: Mood normal.     (all labs ordered are listed, but only abnormal results are displayed) Labs Reviewed  CBC - Abnormal; Notable for the following components:      Result Value   RBC 3.81 (*)    Hemoglobin 11.8 (*)    RDW 16.8  (*)    Platelets 114 (*)    All other components within normal limits  BASIC METABOLIC PANEL WITH GFR - Abnormal; Notable for the following components:   Chloride 95 (*)    Glucose, Bld 114 (*)    BUN 36 (*)    Creatinine, Ser 4.47 (*)    GFR, Estimated 10 (*)    All other components within normal limits    EKG: None  Radiology: CT Lumbar Spine Wo Contrast Result Date: 11/15/2023 EXAM: CT OF THE LUMBAR SPINE WITHOUT CONTRAST 11/15/2023 07:26:02 PM TECHNIQUE: CT of the lumbar spine was performed without the administration of intravenous contrast. Multiplanar reformatted images are provided for review. Automated exposure control, iterative reconstruction, and/or weight based adjustment of the mA/kV was utilized to reduce the radiation dose to as low as reasonably achievable. COMPARISON: None available. CLINICAL HISTORY: Back trauma, no prior imaging (Age >= 16y). Patient BIB [facility] from home, fell 6 days ago, has sacral and L hip pain with standing, rotation. Hx of dementia, lives at home with husband. FINDINGS: BONES AND ALIGNMENT: Normal vertebral body heights. No acute fracture or suspicious bone lesion. Normal alignment. DEGENERATIVE CHANGES: Mild degenerative changes at L5-S1. SOFT TISSUES: No acute abnormality. Multiple bowel sutures in the right mid abdomen. VASCULATURE: Atherosclerotic calcifications of the abdominal aorta and branch vessels. STOMACH AND BOWEL: Extensive colonic diverticulosis, without evidence of diverticulitis. IMPRESSION: 1. No acute findings. 2. Mild degenerative changes at L5-S1. Electronically signed by: Pinkie Pebbles MD 11/15/2023 08:06 PM EDT RP Workstation: [id number]   CT PELVIS WO CONTRAST Result Date: 11/15/2023 CLINICAL DATA:  Fall. EXAM: CT PELVIS WITHOUT CONTRAST TECHNIQUE: Multidetector CT imaging of the pelvis was performed following the standard protocol without intravenous contrast. RADIATION DOSE REDUCTION: This exam was performed according to the  departmental dose-optimization program which includes automated exposure control, adjustment of the mA and/or kV according to patient size and/or use of iterative reconstruction technique. COMPARISON:  CT abdomen and pelvis 04/27/2021. FINDINGS: Urinary Tract: There is diffuse bladder wall thickening versus normal under distention. Bowel: No acute bowel pathology. There is diffuse colonic diverticulosis. Small bowel anastomosis is present. Vascular/Lymphatic: There are extensive vascular calcifications including peripheral vascular calcifications throughout the abdomen and pelvis. Visualized aorta is normal in size. No enlarged lymph nodes are identified. Reproductive: There are prominent vascular calcifications in the uterus. Adnexa are within normal limits. Other: Pelvic floor relaxation present. There is no ascites or  focal abdominal wall hernia. Musculoskeletal: The bones are diffusely osteopenic. There is no acute fracture or dislocation identified. No focal osseous lesions are seen. Degenerative changes affect the bilateral sacroiliac joints. IMPRESSION: 1. No acute fracture or dislocation. 2. Diffuse bladder wall thickening versus normal under distention. Correlate clinically for cystitis. 3. Colonic diverticulosis. 4. Pelvic floor relaxation. 5. Extensive vascular calcifications. Electronically Signed   By: Greig Pique M.D.   On: 11/15/2023 19:53   DG Hip Unilat W or Wo Pelvis 2-3 Views Left Result Date: 11/15/2023 CLINICAL DATA:  Fall 6 days ago. EXAM: DG HIP (WITH OR WITHOUT PELVIS) 2-3V LEFT COMPARISON:  Left hip x-ray 07/27/2021. FINDINGS: The bones are diffusely osteopenic. There is no acute fracture or dislocation identified. Joint spaces are well maintained. Extensive peripheral vascular calcifications are present. IMPRESSION: 1. No acute fracture or dislocation. 2. Evaluation is limited by osteopenia. If there is high clinical concern for occult fracture, CT would be more sensitive.  Electronically Signed   By: Greig Pique M.D.   On: 11/15/2023 17:21     Procedures   Medications Ordered in the ED - No data to display  Clinical Course as of 11/15/23 2031  Mon Nov 15, 2023  2013 CBC(!) Hemoglobin stable compared to previous [JK]  2013 Basic metabolic panel(!) Creatinine elevated similar to previous [JK]  2014 X-rays do not show any signs of acute fracture or injury. [JK]    Clinical Course User Index [JK] Randol Simmonds, MD                                 Medical Decision Making Problems Addressed: Fall, initial encounter: acute illness or injury that poses a threat to life or bodily functions  Amount and/or Complexity of Data Reviewed Labs: ordered. Decision-making details documented in ED Course. Radiology: ordered and independent interpretation performed.   Patient presented to the ED for evaluation after a fall.  Patient has been having some discomfort over the last several days.  She was at dialysis today and she was sent to the ED because of her pain and discomfort.  In the ER however the patient peers rather comfortable.  She only has mild tenderness in her lower back.  Patient does have dementia so she is unable to provide a clear history of what occurred in what has been hurting her.  Her ED workup is reassuring.  There is no signs of any acute fracture in her lumbar spine CT or her pelvis CT.  No signs of hip fracture on plain films.  Patient has been able to ambulate.  Discussed findings with her daughter and the patient.  I did also review the possible thickened bladder noted on CT scan.  Patient does not routinely urinate.  She is not having urinary symptoms currently.  Doubt infection  Evaluation and diagnostic testing in the emergency department does not suggest an emergent condition requiring admission or immediate intervention beyond what has been performed at this time.  The patient is safe for discharge and has been instructed to return  immediately for worsening symptoms, change in symptoms or any other concerns.     Final diagnoses:  Fall, initial encounter    ED Discharge Orders     None          Randol Simmonds, MD 11/15/23 2033

## 2023-11-15 NOTE — ED Provider Triage Note (Signed)
 Emergency Medicine Provider Triage Evaluation Note  Bailey [name] , a 73 y.o. female  was evaluated in triage.  Pt has no complaint when asked.  Patient states that she was at dialysis, brought to the emergency department after session.  Triage note upon EMS arrival: home, fell 6 days ago, has sacral and L hip pain with standing, rotation. Hx of dementia, lives at home with husband.  Review of Systems  Positive: EMS reports hip pain Negative:   Physical Exam  BP (!) 120/54 (BP Location: Right Arm)   Pulse (!) 59   Temp 97.7 F (36.5 C) (Oral)   Resp 16   LMP  (LMP Unknown)   SpO2 100%  Gen:   Awake, no distress   Resp:  Normal effort  MSK:   Patient sitting in chair in the hallway, unable to fully assess hip Other:    Medical Decision Making  Medically screening exam initiated at 3:25 PM.  Appropriate orders placed.  GIOVANNINA MUN was informed that the remainder of the evaluation will be completed by another provider, this initial triage assessment does not replace that evaluation, and the importance of remaining in the ED until their evaluation is complete.  Patient denies current complaint but has history of dementia per EMS report.  Will obtain hip x-rays based on history provided by EMS.   Desiderio Chew, PA-C 11/15/23 1527

## 2023-11-17 DIAGNOSIS — R110 Nausea: Secondary | ICD-10-CM | POA: Diagnosis not present

## 2023-11-17 DIAGNOSIS — N2581 Secondary hyperparathyroidism of renal origin: Secondary | ICD-10-CM | POA: Diagnosis not present

## 2023-11-17 DIAGNOSIS — N189 Chronic kidney disease, unspecified: Secondary | ICD-10-CM | POA: Diagnosis not present

## 2023-11-17 DIAGNOSIS — Z992 Dependence on renal dialysis: Secondary | ICD-10-CM | POA: Diagnosis not present

## 2023-11-17 DIAGNOSIS — D509 Iron deficiency anemia, unspecified: Secondary | ICD-10-CM | POA: Diagnosis not present

## 2023-11-17 DIAGNOSIS — N186 End stage renal disease: Secondary | ICD-10-CM | POA: Diagnosis not present

## 2023-11-19 DIAGNOSIS — F01B0 Vascular dementia, moderate, without behavioral disturbance, psychotic disturbance, mood disturbance, and anxiety: Secondary | ICD-10-CM | POA: Diagnosis not present

## 2023-11-19 DIAGNOSIS — Z992 Dependence on renal dialysis: Secondary | ICD-10-CM | POA: Diagnosis not present

## 2023-11-19 DIAGNOSIS — R110 Nausea: Secondary | ICD-10-CM | POA: Diagnosis not present

## 2023-11-19 DIAGNOSIS — N189 Chronic kidney disease, unspecified: Secondary | ICD-10-CM | POA: Diagnosis not present

## 2023-11-19 DIAGNOSIS — E113592 Type 2 diabetes mellitus with proliferative diabetic retinopathy without macular edema, left eye: Secondary | ICD-10-CM | POA: Diagnosis not present

## 2023-11-19 DIAGNOSIS — Z23 Encounter for immunization: Secondary | ICD-10-CM | POA: Diagnosis not present

## 2023-11-19 DIAGNOSIS — F331 Major depressive disorder, recurrent, moderate: Secondary | ICD-10-CM | POA: Diagnosis not present

## 2023-11-19 DIAGNOSIS — I770 Arteriovenous fistula, acquired: Secondary | ICD-10-CM | POA: Diagnosis not present

## 2023-11-19 DIAGNOSIS — I132 Hypertensive heart and chronic kidney disease with heart failure and with stage 5 chronic kidney disease, or end stage renal disease: Secondary | ICD-10-CM | POA: Diagnosis not present

## 2023-11-19 DIAGNOSIS — N2581 Secondary hyperparathyroidism of renal origin: Secondary | ICD-10-CM | POA: Diagnosis not present

## 2023-11-19 DIAGNOSIS — D509 Iron deficiency anemia, unspecified: Secondary | ICD-10-CM | POA: Diagnosis not present

## 2023-11-19 DIAGNOSIS — R011 Cardiac murmur, unspecified: Secondary | ICD-10-CM | POA: Diagnosis not present

## 2023-11-19 DIAGNOSIS — N186 End stage renal disease: Secondary | ICD-10-CM | POA: Diagnosis not present

## 2023-11-22 DIAGNOSIS — N189 Chronic kidney disease, unspecified: Secondary | ICD-10-CM | POA: Diagnosis not present

## 2023-11-22 DIAGNOSIS — R110 Nausea: Secondary | ICD-10-CM | POA: Diagnosis not present

## 2023-11-22 DIAGNOSIS — N2581 Secondary hyperparathyroidism of renal origin: Secondary | ICD-10-CM | POA: Diagnosis not present

## 2023-11-22 DIAGNOSIS — Z992 Dependence on renal dialysis: Secondary | ICD-10-CM | POA: Diagnosis not present

## 2023-11-22 DIAGNOSIS — D509 Iron deficiency anemia, unspecified: Secondary | ICD-10-CM | POA: Diagnosis not present

## 2023-11-22 DIAGNOSIS — N186 End stage renal disease: Secondary | ICD-10-CM | POA: Diagnosis not present

## 2023-11-24 DIAGNOSIS — N186 End stage renal disease: Secondary | ICD-10-CM | POA: Diagnosis not present

## 2023-11-24 DIAGNOSIS — I959 Hypotension, unspecified: Secondary | ICD-10-CM | POA: Diagnosis not present

## 2023-11-24 DIAGNOSIS — Z992 Dependence on renal dialysis: Secondary | ICD-10-CM | POA: Diagnosis not present

## 2023-11-24 DIAGNOSIS — N2581 Secondary hyperparathyroidism of renal origin: Secondary | ICD-10-CM | POA: Diagnosis not present

## 2023-11-24 DIAGNOSIS — E1129 Type 2 diabetes mellitus with other diabetic kidney complication: Secondary | ICD-10-CM | POA: Diagnosis not present

## 2023-11-27 DIAGNOSIS — N186 End stage renal disease: Secondary | ICD-10-CM | POA: Diagnosis not present

## 2023-11-27 DIAGNOSIS — I959 Hypotension, unspecified: Secondary | ICD-10-CM | POA: Diagnosis not present

## 2023-11-27 DIAGNOSIS — N2581 Secondary hyperparathyroidism of renal origin: Secondary | ICD-10-CM | POA: Diagnosis not present

## 2023-11-27 DIAGNOSIS — Z992 Dependence on renal dialysis: Secondary | ICD-10-CM | POA: Diagnosis not present

## 2023-11-29 DIAGNOSIS — I959 Hypotension, unspecified: Secondary | ICD-10-CM | POA: Diagnosis not present

## 2023-11-29 DIAGNOSIS — N2581 Secondary hyperparathyroidism of renal origin: Secondary | ICD-10-CM | POA: Diagnosis not present

## 2023-11-29 DIAGNOSIS — Z992 Dependence on renal dialysis: Secondary | ICD-10-CM | POA: Diagnosis not present

## 2023-11-29 DIAGNOSIS — N186 End stage renal disease: Secondary | ICD-10-CM | POA: Diagnosis not present

## 2023-12-03 ENCOUNTER — Other Ambulatory Visit: Payer: Self-pay

## 2023-12-03 ENCOUNTER — Emergency Department (HOSPITAL_COMMUNITY)

## 2023-12-03 ENCOUNTER — Inpatient Hospital Stay (HOSPITAL_COMMUNITY)
Admission: EM | Admit: 2023-12-03 | Discharge: 2023-12-06 | DRG: 280 | Disposition: A | Discharge status: Home Health | Attending: Internal Medicine | Admitting: Internal Medicine

## 2023-12-03 DIAGNOSIS — I700 Atherosclerosis of aorta: Secondary | ICD-10-CM | POA: Diagnosis not present

## 2023-12-03 DIAGNOSIS — I959 Hypotension, unspecified: Secondary | ICD-10-CM | POA: Diagnosis not present

## 2023-12-03 DIAGNOSIS — I350 Nonrheumatic aortic (valve) stenosis: Secondary | ICD-10-CM | POA: Diagnosis not present

## 2023-12-03 DIAGNOSIS — I21A1 Myocardial infarction type 2: Secondary | ICD-10-CM | POA: Diagnosis present

## 2023-12-03 DIAGNOSIS — R918 Other nonspecific abnormal finding of lung field: Secondary | ICD-10-CM | POA: Diagnosis not present

## 2023-12-03 DIAGNOSIS — I080 Rheumatic disorders of both mitral and aortic valves: Secondary | ICD-10-CM | POA: Diagnosis present

## 2023-12-03 DIAGNOSIS — R4189 Other symptoms and signs involving cognitive functions and awareness: Secondary | ICD-10-CM | POA: Diagnosis present

## 2023-12-03 DIAGNOSIS — D7589 Other specified diseases of blood and blood-forming organs: Secondary | ICD-10-CM | POA: Diagnosis present

## 2023-12-03 DIAGNOSIS — E11319 Type 2 diabetes mellitus with unspecified diabetic retinopathy without macular edema: Secondary | ICD-10-CM | POA: Diagnosis present

## 2023-12-03 DIAGNOSIS — Z79899 Other long term (current) drug therapy: Secondary | ICD-10-CM

## 2023-12-03 DIAGNOSIS — Z89411 Acquired absence of right great toe: Secondary | ICD-10-CM

## 2023-12-03 DIAGNOSIS — J811 Chronic pulmonary edema: Secondary | ICD-10-CM | POA: Diagnosis present

## 2023-12-03 DIAGNOSIS — F03C0 Unspecified dementia, severe, without behavioral disturbance, psychotic disturbance, mood disturbance, and anxiety: Secondary | ICD-10-CM | POA: Diagnosis present

## 2023-12-03 DIAGNOSIS — D696 Thrombocytopenia, unspecified: Secondary | ICD-10-CM | POA: Diagnosis present

## 2023-12-03 DIAGNOSIS — R531 Weakness: Secondary | ICD-10-CM | POA: Diagnosis not present

## 2023-12-03 DIAGNOSIS — I120 Hypertensive chronic kidney disease with stage 5 chronic kidney disease or end stage renal disease: Secondary | ICD-10-CM | POA: Diagnosis present

## 2023-12-03 DIAGNOSIS — F32A Depression, unspecified: Secondary | ICD-10-CM | POA: Diagnosis present

## 2023-12-03 DIAGNOSIS — K551 Chronic vascular disorders of intestine: Secondary | ICD-10-CM | POA: Diagnosis not present

## 2023-12-03 DIAGNOSIS — N390 Urinary tract infection, site not specified: Secondary | ICD-10-CM | POA: Diagnosis present

## 2023-12-03 DIAGNOSIS — N2581 Secondary hyperparathyroidism of renal origin: Secondary | ICD-10-CM | POA: Diagnosis present

## 2023-12-03 DIAGNOSIS — N2889 Other specified disorders of kidney and ureter: Secondary | ICD-10-CM | POA: Diagnosis not present

## 2023-12-03 DIAGNOSIS — D649 Anemia, unspecified: Secondary | ICD-10-CM | POA: Diagnosis present

## 2023-12-03 DIAGNOSIS — Z992 Dependence on renal dialysis: Secondary | ICD-10-CM

## 2023-12-03 DIAGNOSIS — N186 End stage renal disease: Secondary | ICD-10-CM | POA: Diagnosis not present

## 2023-12-03 DIAGNOSIS — I953 Hypotension of hemodialysis: Secondary | ICD-10-CM | POA: Diagnosis present

## 2023-12-03 DIAGNOSIS — I6523 Occlusion and stenosis of bilateral carotid arteries: Secondary | ICD-10-CM | POA: Diagnosis present

## 2023-12-03 DIAGNOSIS — Z833 Family history of diabetes mellitus: Secondary | ICD-10-CM | POA: Diagnosis not present

## 2023-12-03 DIAGNOSIS — I428 Other cardiomyopathies: Secondary | ICD-10-CM | POA: Diagnosis not present

## 2023-12-03 DIAGNOSIS — Z8673 Personal history of transient ischemic attack (TIA), and cerebral infarction without residual deficits: Secondary | ICD-10-CM

## 2023-12-03 DIAGNOSIS — D631 Anemia in chronic kidney disease: Secondary | ICD-10-CM | POA: Diagnosis present

## 2023-12-03 DIAGNOSIS — E1122 Type 2 diabetes mellitus with diabetic chronic kidney disease: Secondary | ICD-10-CM | POA: Diagnosis present

## 2023-12-03 DIAGNOSIS — I10 Essential (primary) hypertension: Secondary | ICD-10-CM | POA: Diagnosis not present

## 2023-12-03 DIAGNOSIS — E785 Hyperlipidemia, unspecified: Secondary | ICD-10-CM | POA: Diagnosis present

## 2023-12-03 DIAGNOSIS — Z87442 Personal history of urinary calculi: Secondary | ICD-10-CM

## 2023-12-03 DIAGNOSIS — Z7982 Long term (current) use of aspirin: Secondary | ICD-10-CM

## 2023-12-03 DIAGNOSIS — I214 Non-ST elevation (NSTEMI) myocardial infarction: Secondary | ICD-10-CM | POA: Diagnosis not present

## 2023-12-03 DIAGNOSIS — N250 Renal osteodystrophy: Secondary | ICD-10-CM | POA: Diagnosis not present

## 2023-12-03 DIAGNOSIS — D539 Nutritional anemia, unspecified: Secondary | ICD-10-CM | POA: Diagnosis present

## 2023-12-03 DIAGNOSIS — R7989 Other specified abnormal findings of blood chemistry: Secondary | ICD-10-CM

## 2023-12-03 DIAGNOSIS — E1142 Type 2 diabetes mellitus with diabetic polyneuropathy: Secondary | ICD-10-CM | POA: Diagnosis present

## 2023-12-03 DIAGNOSIS — R0989 Other specified symptoms and signs involving the circulatory and respiratory systems: Secondary | ICD-10-CM | POA: Diagnosis not present

## 2023-12-03 DIAGNOSIS — I517 Cardiomegaly: Secondary | ICD-10-CM | POA: Diagnosis not present

## 2023-12-03 DIAGNOSIS — R778 Other specified abnormalities of plasma proteins: Secondary | ICD-10-CM | POA: Diagnosis not present

## 2023-12-03 LAB — CBC
HCT: 30.1 % — ABNORMAL LOW (ref 36.0–46.0)
Hemoglobin: 9 g/dL — ABNORMAL LOW (ref 12.0–15.0)
MCH: 31.6 pg (ref 26.0–34.0)
MCHC: 29.9 g/dL — ABNORMAL LOW (ref 30.0–36.0)
MCV: 105.6 fL — ABNORMAL HIGH (ref 80.0–100.0)
Platelets: 94 K/uL — ABNORMAL LOW (ref 150–400)
RBC: 2.85 MIL/uL — ABNORMAL LOW (ref 3.87–5.11)
RDW: 18.3 % — ABNORMAL HIGH (ref 11.5–15.5)
WBC: 8.8 K/uL (ref 4.0–10.5)
nRBC: 0 % (ref 0.0–0.2)

## 2023-12-03 LAB — COMPREHENSIVE METABOLIC PANEL WITH GFR
ALT: 136 U/L — ABNORMAL HIGH (ref 0–44)
AST: 43 U/L — ABNORMAL HIGH (ref 15–41)
Albumin: 3.2 g/dL — ABNORMAL LOW (ref 3.5–5.0)
Alkaline Phosphatase: 76 U/L (ref 38–126)
Anion gap: 14 (ref 5–15)
BUN: 23 mg/dL (ref 8–23)
CO2: 27 mmol/L (ref 22–32)
Calcium: 8.2 mg/dL — ABNORMAL LOW (ref 8.9–10.3)
Chloride: 95 mmol/L — ABNORMAL LOW (ref 98–111)
Creatinine, Ser: 2.97 mg/dL — ABNORMAL HIGH (ref 0.44–1.00)
GFR, Estimated: 16 mL/min — ABNORMAL LOW (ref >60)
Glucose, Bld: 100 mg/dL — ABNORMAL HIGH (ref 70–99)
Potassium: 3.5 mmol/L (ref 3.5–5.1)
Sodium: 136 mmol/L (ref 135–145)
Total Bilirubin: 1.3 mg/dL — ABNORMAL HIGH (ref 0.0–1.2)
Total Protein: 6.7 g/dL (ref 6.5–8.1)

## 2023-12-03 LAB — TROPONIN I (HIGH SENSITIVITY): Troponin I (High Sensitivity): 441 ng/L (ref <18)

## 2023-12-03 LAB — PROTIME-INR
INR: 1.1 (ref 0.8–1.2)
Prothrombin Time: 15 s (ref 11.4–15.2)

## 2023-12-03 LAB — IRON AND TIBC
Iron: 37 ug/dL (ref 28–170)
Saturation Ratios: 20 % (ref 10.4–31.8)
TIBC: 183 ug/dL — ABNORMAL LOW (ref 250–450)
UIBC: 146 ug/dL

## 2023-12-03 LAB — FERRITIN: Ferritin: 1317 ng/mL — ABNORMAL HIGH (ref 11–307)

## 2023-12-03 LAB — CBG MONITORING, ED: Glucose-Capillary: 84 mg/dL (ref 70–99)

## 2023-12-03 MED ORDER — ACETAMINOPHEN 325 MG PO TABS: 650.0000 mg | ORAL_TABLET | Freq: Four times a day (QID) | ORAL | Status: DC | PRN

## 2023-12-03 MED ORDER — ASPIRIN 81 MG PO TBEC: 81.0000 mg | DELAYED_RELEASE_TABLET | Freq: Every day | ORAL | Status: DC

## 2023-12-03 MED ORDER — CARVEDILOL 3.125 MG PO TABS
3.1250 mg | ORAL_TABLET | Freq: Two times a day (BID) | ORAL | Status: DC
  Administered 2023-12-03 – 2023-12-05 (×4): 3.125 mg via ORAL
  Filled 2023-12-03 (×4): qty 1

## 2023-12-03 MED ORDER — ATORVASTATIN CALCIUM 40 MG PO TABS
40.0000 mg | ORAL_TABLET | Freq: Every day | ORAL | Status: DC
  Administered 2023-12-04 – 2023-12-06 (×3): 40 mg via ORAL
  Filled 2023-12-03 (×3): qty 1

## 2023-12-03 MED ORDER — ONDANSETRON HCL 4 MG/2ML IJ SOLN: 4.0000 mg | Freq: Four times a day (QID) | INTRAMUSCULAR | Status: DC | PRN

## 2023-12-03 MED ORDER — ONDANSETRON HCL 4 MG PO TABS: 4.0000 mg | ORAL_TABLET | Freq: Four times a day (QID) | ORAL | Status: DC | PRN

## 2023-12-03 MED ORDER — HEPARIN SODIUM (PORCINE) 5000 UNIT/ML IJ SOLN
5000.0000 [IU] | Freq: Three times a day (TID) | INTRAMUSCULAR | Status: DC
  Administered 2023-12-03: 5000 [IU] via SUBCUTANEOUS
  Filled 2023-12-03: qty 1

## 2023-12-03 MED ORDER — DONEPEZIL HCL 5 MG PO TABS
5.0000 mg | ORAL_TABLET | Freq: Every evening | ORAL | Status: DC
  Administered 2023-12-03 – 2023-12-06 (×4): 5 mg via ORAL
  Filled 2023-12-03 (×4): qty 1

## 2023-12-03 MED ORDER — ACETAMINOPHEN 650 MG RE SUPP: 650.0000 mg | Freq: Four times a day (QID) | RECTAL | Status: DC | PRN

## 2023-12-03 MED ORDER — AMLODIPINE BESYLATE 5 MG PO TABS: 10.0000 mg | ORAL_TABLET | Freq: Every day | ORAL | Status: DC

## 2023-12-03 NOTE — ED Notes (Signed)
 Patient produces urine but is unable to urinate at this time. Will let staff know when she can give urine sample.

## 2023-12-03 NOTE — ED Triage Notes (Signed)
 BIB EMS [facility] from dialysis, came in with weakness, full dialysis tx done 800 taken off and given back in fluid d/t low Bps, missed tx on wed d/t not feeling well. Had not missed any tx prior. During dialysis pressures extremely low 48/24, 80/46, 80's over 40's   EMS vitals HR 60 BG 125 98% RA Left arm restricted, 20 G R hand  800 CC fluid given before EMS arrival

## 2023-12-03 NOTE — Consult Note (Addendum)
 Cardiology Consult Note:    Patient ID: Bailey [name] MRN: [id number]; DOB: 1950-12-17   Admission date: 12/03/2023  Primary Care Provider: Arloa Jarvis, NP Primary Cardiologist: None  Primary Electrophysiologist:  None   Patient Profile:   Bailey [name] is a 73 y.o. female with  PAD completed by foot ulcers and amputation, ESRD MWF,  DM. HTN   History of Present Illness:   Bailey [name] presents to the emergency room after being found to be hypotensive in dialysis. The end of session she is found to be hypotensive. History was obtained via conversation with the ED team. Bailey [name] has dementia and is not able to recall what brought her in today. Her husband is no longer present.  She is without chest pain or discomfort.   Presented to the emergency department with a blood pressure of 103/44, HR 59.  Creatinine 2.7 (on HD).  WBC of 8.8, hemoglobin 9.0, platelet 94.  Past Medical History:  Diagnosis Date   Anemia    patient preference - stopped iron     ARF (acute renal failure) 04/2016   dehydration   Chronic kidney disease    stage 5   DDD (degenerative disc disease), lumbar    Depression    Diabetes mellitus    Type II - pt preference - stopped lantus   Diabetic retinopathy (HCC)    Diverticulitis    History of kidney stones    passed- 6   Hypertension    Lacunar infarction (HCC)    Osteomyelitis (HCC)    Polyneuropathy    Vitamin B 12 deficiency 06/09/2017   Vitamin D deficiency    Wears partial dentures    upper    Past Surgical History:  Procedure Laterality Date   ABDOMINAL AORTOGRAM W/LOWER EXTREMITY Right 02/11/2023   Procedure: ABDOMINAL AORTOGRAM W/LOWER EXTREMITY;  Surgeon: Gretta Lonni PARAS, MD;  Location: [facility];  Service: Cardiovascular;  Laterality: Right;   AMPUTATION Right 08/14/2016   Procedure: RIGHT 1ST RAY AMPUTATION MID-SHAFT;  Surgeon: Harden Jerona GAILS, MD;  Location: [facility] OR;  Service: Orthopedics;  Laterality: Right;   BASCILIC  VEIN TRANSPOSITION Left 06/08/2019   Procedure: LEFT BRACHIOCEPHALIC VEIN CREATION;  Surgeon: Gretta Lonni PARAS, MD;  Location: [facility] OR;  Service: Vascular;  Laterality: Left;   CATARACT EXTRACTION  2018   INCISION AND DRAINAGE ABSCESS Left 02/11/2014   Procedure: INCISION AND DRAINAGE ABSCESS Left Buttock;  Surgeon: Krystal Russell, MD;  Location: [facility] ORS;  Service: General;  Laterality: Left;   INSERTION OF DIALYSIS CATHETER Left 09/07/2019   Procedure: INSERTION OF LEFT INTERNAL JUGULAR DIALYSIS CATHETER;  Surgeon: Oris Krystal FALCON, MD;  Location: [facility] OR;  Service: Vascular;  Laterality: Left;   IR REMOVAL TUN CV CATH W/O FL  09/04/2019   LAPAROSCOPIC SMALL BOWEL RESECTION N/A 05/09/2016   Procedure: LAPAROSCOPIC SMALL BOWEL RESECTION;  Surgeon: Elspeth Schultze, MD;  Location: [facility] OR[facility];  Service: General;  Laterality: N/A;   LAPAROSCOPY N/A 05/09/2016   Procedure: LAPAROSCOPY DIAGNOSTIC, LYSIS OF ADHESIONS, SMALL BOWEL RESECTION X 2;  Surgeon: Elspeth Schultze, MD;  Location: [facility] ORS;  Service: General;  Laterality: N/A;   TOE AMPUTATION     left foot great toe   VENOUS ANGIOPLASTY  07/22/2023   Procedure: VENOUS ANGIOPLASTY;  Surgeon: Gretta Lonni PARAS, MD;  Location: [facility];  Service: Cardiovascular;;  cephalic arch     Medications Prior to Admission: Prior to Admission medications   Medication Sig Start Date End Date Taking?  Authorizing Provider  amLODipine  (NORVASC ) 10 MG tablet Take 10 mg by mouth daily.    [provider]  ascorbic acid (VITAMIN C) 500 MG tablet Take 500 mg by mouth daily.    [provider]  aspirin  EC 81 MG tablet Take 1 tablet (81 mg total) by mouth daily. Swallow whole. 10/02/22   Rojelio Nest, DO  atorvastatin  (LIPITOR) 40 MG tablet TAKE 1 TABLET(40 MG) BY MOUTH DAILY 09/01/22   Rush Nest, MD  AURYXIA  1 GM 210 MG(Fe) tablet Take 210 mg by mouth 3 (three) times daily with meals. 02/06/21   [provider]  carvedilol  (COREG ) 6.25 MG  tablet Take 3.125 mg by mouth 2 (two) times daily. 01/22/23   [provider]  donepezil  (ARICEPT ) 5 MG tablet Take 1/2 pill daily for 4 weeks, then increase to 1 pill daily Patient taking differently: Take 5 mg by mouth every evening. 12/22/21   Rush Nest, MD  multivitamin (RENA-VIT) TABS tablet Take 1 tablet by mouth daily.    [provider]  olmesartan  (BENICAR ) 40 MG tablet Take 1/2 tablet (20 mg total) by mouth daily. Patient not taking: Reported on 02/05/2023 07/29/22        Allergies:   No Known Allergies  Social History:   Social History   Socioeconomic History   Marital status: Married    Spouse name: Not on file   Number of children: Not on file   Years of education: Not on file   Highest education level: Not on file  Occupational History   Not on file  Tobacco Use   Smoking status: Never   Smokeless tobacco: Never  Vaping Use   Vaping status: Never Used  Substance and Sexual Activity   Alcohol  use: No    Alcohol /week: 0.0 standard drinks of alcohol    Drug use: No   Sexual activity: Not on file  Other Topics Concern   Not on file  Social History Narrative   Not on file   Social Drivers of Health   Financial Resource Strain: Not on file  Food Insecurity: No Food Insecurity (09/30/2022)   Hunger Vital Sign    Worried About Running Out of Food in the Last Year: Never true    Ran Out of Food in the Last Year: Never true  Transportation Needs: No Transportation Needs (09/30/2022)   PRAPARE - Administrator, Civil Service (Medical): No    Lack of Transportation (Non-Medical): No  Physical Activity: Not on file  Stress: Not on file  Social Connections: Not on file  Intimate Partner Violence: Not At Risk (09/30/2022)   Humiliation, Afraid, Rape, and Kick questionnaire    Fear of Current or Ex-Partner: No    Emotionally Abused: No    Physically Abused: No    Sexually Abused: No    Family History:   The patient's family history  includes Asthma in her sister; Diabetes in her mother.    Review of Systems: [y] = yes, [ ]  = no    General: Weight gain [ ] ; Weight loss [ ] ; Anorexia [ ] ; Fatigue [ ] ; Fever [ ] ; Chills [ ] ; Weakness [ ]   Cardiac: Chest pain/pressure [ ] ; Resting SOB [ ] ; Exertional SOB [ ] ; Orthopnea [ ] ; Pedal Edema [ ] ; Palpitations [ ] ; Syncope [ ] ; Presyncope [ ] ; Paroxysmal nocturnal dyspnea[ ]   Pulmonary: Cough [ ] ; Wheezing[ ] ; Hemoptysis[ ] ; Sputum [ ] ; Snoring [ ]   GI: Vomiting[ ] ; Dysphagia[ ] ;  Melena[ ] ; Hematochezia [ ] ; Heartburn[ ] ; Abdominal pain [ ] ; Constipation [ ] ; Diarrhea [ ] ; BRBPR [ ]   GU: Hematuria[ ] ; Dysuria [ ] ; Nocturia[ ]   Vascular: Pain in legs with walking [ ] ; Pain in feet with lying flat [ ] ; Non-healing sores [ ] ; Stroke [ ] ; TIA [ ] ; Slurred speech [ ] ;  Neuro: Headaches[ ] ; Vertigo[ ] ; Seizures[ ] ; Paresthesias[ ] ;Blurred vision [ ] ; Diplopia [ ] ; Vision changes [ ]   Ortho/Skin: Arthritis [ ] ; Joint pain [ ] ; Muscle pain [ ] ; Joint swelling [ ] ; Back Pain [ ] ; Rash [ ]   Psych: Depression[ ] ; Anxiety[ ]   Heme: Bleeding problems [ ] ; Clotting disorders [ ] ; Anemia [ ]   Endocrine: Diabetes [ ] ; Thyroid  dysfunction[ ]   Physical Exam/Data:   Vitals:   12/03/23 1815 12/03/23 1830 12/03/23 1845 12/03/23 2059  BP: (!) 104/50 (!) 101/56 (!) 109/50 (!) 107/47  Pulse: 64 65 64 66  Resp: (!) 21 (!) 22 (!) 21 18  Temp:      TempSrc:      SpO2: 92% 94% 95% 93%   No intake or output data in the 24 hours ending 12/03/23 2145 There were no vitals filed for this visit. There is no height or weight on file to calculate BMI.   General:  Well nourished, well developed, in no acute distress HEENT: normal Neck: no JVD Vascular: No carotid bruits; FA pulses 2+ bilaterally without bruits  Cardiac:  normal S1, S2; RRR Lungs:  clear to auscultation bilaterally, no wheezing, rhonchi or rales  Abd: soft, nontender Ext: no edema Musculoskeletal:  No deformities Skin: warm and dry   Psych:  Normal affect    EKG:    Normal sinus rhythm at a rate of 66 bpm. Left ventricular hypertrophy.  ST depressions precordial and lead II, aVF, V4- V6. 1mm aVR elevation.  These ischemic changes are persistent and her EKG was obtained on 12/03/2023. However, the ischemic changes are not seen in her EKG from 02/12/2023. LVH remains present.  Relevant CV Studies: ECHO 07/12/2022   1. Left ventricular ejection fraction, by estimation, is 65 to 70%. The left ventricle has normal function. The left ventricle has no regional wall motion abnormalities. There is moderate concentric left ventricular hypertrophy. Left ventricular diastolic function could not be evaluated.  2. Right ventricular systolic function is normal. The right ventricular size is normal. There is normal pulmonary artery systolic pressure. The estimated right ventricular systolic pressure is 24.6 mmHg.  3. Left atrial size was severely dilated.  4. Right atrial size was mildly dilated.  5. A small pericardial effusion is present. The pericardial effusion is circumferential.  6. The mitral valve is degenerative. Mild mitral valve regurgitation. Severe mitral annular calcification.  7. The aortic valve is tricuspid. Aortic valve regurgitation is not visualized. Moderate aortic valve stenosis. Aortic valve area, by VTI measures 1.01 cm. Aortic valve mean gradient measures 23.9 mmHg. Aortic valve Vmax measures 3.25 m/s.  8. The inferior vena cava is normal in size with <50% respiratory variability, suggesting right atrial pressure of 8 mmHg.    Laboratory Data: Component     Latest Ref Rng 11/15/2023  Sodium     135 - 145 mmol/L 137   Potassium     3.5 - 5.1 mmol/L 3.9   Chloride     98 - 111 mmol/L 95 (L)   CO2     22 - 32 mmol/L 27   Glucose  70 - 99 mg/dL 885 (H)   BUN     8 - 23 mg/dL 36 (H)   Creatinine     0.44 - 1.00 mg/dL 5.52 (H)   Calcium      8.9 - 10.3 mg/dL 9.3   GFR, Estimated     >60  mL/min 10 (L)   Anion gap     5 - 15  15     Component     Latest Ref Rng 12/03/2023  Sodium     135 - 145 mmol/L 136   Potassium     3.5 - 5.1 mmol/L 3.5   Chloride     98 - 111 mmol/L 95 (L)   CO2     22 - 32 mmol/L 27   Glucose     70 - 99 mg/dL 899 (H)   BUN     8 - 23 mg/dL 23   Creatinine     9.55 - 1.00 mg/dL 7.02 (H)   Calcium      8.9 - 10.3 mg/dL 8.2 (L)   Total Protein     6.5 - 8.1 g/dL 6.7   Albumin     3.5 - 5.0 g/dL 3.2 (L)   AST     15 - 41 U/L 43 (H)   ALT     0 - 44 U/L 136 (H)   Alkaline Phosphatase     38 - 126 U/L 76   Total Bilirubin     0.0 - 1.2 mg/dL 1.3 (H)   GFR, Estimated     >60 mL/min 16 (L)   Anion gap     5 - 15  14     Legend: (L) Low (H) High Component     Latest Ref Rng 12/03/2023  Troponin I (High Sensitivity)     <18 ng/L 441 (HH)     Legend: [facility]) High Panic Radiology/Studies:  DG Chest Port 1 View Result Date: 12/03/2023 CLINICAL DATA:  Weakness EXAM: PORTABLE CHEST 1 VIEW COMPARISON:  Chest radiograph February 12, 2023. FINDINGS: Cardiomediastinal silhouette is enlarged and increased in size to prior. Aortic knob is calcified. Bilateral vascular congestion and prominent right hilum. Blunting of left costophrenic angle suggestive of small effusion/atelectasis. No acute osseous abnormality. IMPRESSION: Cardiomegaly and pulmonary congestion, progressed to prior. New left small pleural effusion is suspected. Electronically Signed   By: Megan  Zare M.D.   On: 12/03/2023 18:50    Assessment and Plan:  Ms. Siguenza is a 73 year old with a history of PAD who presents to the ED wih hypotension at the end of her HD session. She is currently pain free. Initial troponin was elevated at 441. Second troponin (although  EPIC states the lab was collected) was not drawn. Given it has been >5 hours since initial lab, would recommend getting two back to back troponin levels and ascertain rise/fall based on those two levels ( e.g not including  the one that resulted at 18:17).  Her EKG do show new signs of ischemia with depressions in the inferior and lateral leads.  Given the initial elevated troponin and the EKG changes would recommend treating as an NSTEMI now and beginning heparin .  I see documentation in 2024 that she was previously on aspirin  and Plavix  , however Plavix  is not currently on her medication list.    NSTEMI - Please obtain two troponins one hour apart to ascertain if there is a rise and fall.   - ASA 325 mg ordered.  -Begin  heparin  - ECHO in the AM  - Repeat EKG ordered - Discuss with husband in the AM   Chronic macrocytic anemia with acute component -Hemoglobin 9.0 today down from 12 two weeks prior.  Her MCV is 105 which is concerning for macrocytosis however given the acute drop ordered iron  studies to rule out a iron  component. She will require a hematologic evaluation for her macrocytosis and as renal failure typically causes a normocytic anemia not a macrocytic anemia. - iron  labs - Repeat hemoglobin again tonight to ensure stable  For questions or updates, please contact [facility] Please consult [email or web address] for contact info under        Signed, Merlene JAYSON Blood, MD  12/03/2023 9:45 PM   Merlene Blood, MD MS  Cardiology [facility]    ====== Repeat Hgb 6.3.  Troponin pending. IV heparin  was not initiated. She did received [place] heparin  fo DVT ppx. Given further drop in Hgb, increased concern for demand ischemia. Heparin  and ASA discontinued. Given her ST segments, I would transfuse to Hgb >8

## 2023-12-03 NOTE — ED Provider Notes (Signed)
 [place] EMERGENCY DEPARTMENT AT [facility] Provider Note   CSN: [id number] Arrival date & time: 12/03/23  1609     Patient presents with: Hypotension   TORII ROYSE is a 73 y.o. female.   73 year old female with prior medical history as detailed below presents from dialysis for evaluation.  During dialysis the patient was noted to be hypotensive.  Patient completed all by 30 minutes of her dialysis session.  Patient denies feeling bad during dialysis.  Patient denies current symptoms.  She denies chest pain, shortness of breath, weakness, nausea, vomiting, fever, other complaint.  She is comfortable and without complaint at time of evaluation.  Blood pressure is improved here in the ED.  Dialysis apparently replaced volume that had been taken off at the completion of her dialysis session today.  The history is provided by the patient and medical records.       Prior to Admission medications   Medication Sig Start Date End Date Taking? Authorizing Provider  amLODipine  (NORVASC ) 10 MG tablet Take 10 mg by mouth daily.    [provider]  ascorbic acid (VITAMIN C) 500 MG tablet Take 500 mg by mouth daily.    [provider]  aspirin  EC 81 MG tablet Take 1 tablet (81 mg total) by mouth daily. Swallow whole. 10/02/22   Rojelio Nest, DO  atorvastatin  (LIPITOR) 40 MG tablet TAKE 1 TABLET(40 MG) BY MOUTH DAILY 09/01/22   Rush Nest, MD  AURYXIA  1 GM 210 MG(Fe) tablet Take 210 mg by mouth 3 (three) times daily with meals. 02/06/21   [provider]  carvedilol  (COREG ) 6.25 MG tablet Take 3.125 mg by mouth 2 (two) times daily. 01/22/23   [provider]  donepezil  (ARICEPT ) 5 MG tablet Take 1/2 pill daily for 4 weeks, then increase to 1 pill daily Patient taking differently: Take 5 mg by mouth every evening. 12/22/21   Rush Nest, MD  multivitamin (RENA-VIT) TABS tablet Take 1 tablet by mouth daily.    [provider]   olmesartan  (BENICAR ) 40 MG tablet Take 1/2 tablet (20 mg total) by mouth daily. Patient not taking: Reported on 02/05/2023 07/29/22       Allergies: Patient has no known allergies.    Review of Systems  All other systems reviewed and are negative.   Updated Vital Signs BP (!) 103/44 (BP Location: Right Arm)   Pulse (!) 59   Temp 97.7 F (36.5 C) (Oral)   Resp 16   LMP  (LMP Unknown)   SpO2 100%   Physical Exam Vitals and nursing note reviewed.  Constitutional:      General: She is not in acute distress.    Appearance: Normal appearance. She is well-developed.  HENT:     Head: Normocephalic and atraumatic.  Eyes:     Conjunctiva/sclera: Conjunctivae normal.     Pupils: Pupils are equal, round, and reactive to light.  Cardiovascular:     Rate and Rhythm: Normal rate and regular rhythm.     Heart sounds: Normal heart sounds.  Pulmonary:     Effort: Pulmonary effort is normal. No respiratory distress.     Breath sounds: Normal breath sounds.  Abdominal:     General: There is no distension.     Palpations: Abdomen is soft.     Tenderness: There is no abdominal tenderness.  Musculoskeletal:        General: No deformity. Normal range of motion.     Cervical back: Normal range  of motion and neck supple.  Skin:    General: Skin is warm and dry.  Neurological:     General: No focal deficit present.     Mental Status: She is alert and oriented to person, place, and time.     (all labs ordered are listed, but only abnormal results are displayed) Labs Reviewed  CBC - Abnormal; Notable for the following components:      Result Value   RBC 2.85 (*)    Hemoglobin 9.0 (*)    HCT 30.1 (*)    MCV 105.6 (*)    MCHC 29.9 (*)    RDW 18.3 (*)    Platelets 94 (*)    All other components within normal limits  PROTIME-INR  COMPREHENSIVE METABOLIC PANEL WITH GFR  URINALYSIS, ROUTINE W REFLEX MICROSCOPIC  CBG MONITORING, ED    EKG: None  Radiology: No results  found.   Procedures   Medications Ordered in the ED - No data to display                                  Medical Decision Making Patient presents from dialysis for apparent hypotension during dialysis.  Patient denies any symptoms during this episode.  She remains asymptomatic during the ED evaluation.  Blood pressure is improved here.  Notable abnormalities include elevated troponin.  Case briefly discussed with on-call cardiology fellow Dr. Jenetta who agrees with plan to admit.  She agrees with plan to trend troponins.  Hospitalist service is aware of case and will evaluate for admission.  Amount and/or Complexity of Data Reviewed Labs: ordered. Radiology: ordered.  Risk Decision regarding hospitalization.        Final diagnoses:  ESRD (end stage renal disease) (HCC)  Elevated troponin    ED Discharge Orders     None          Laurice Maude BROCKS, MD 12/03/23 2213

## 2023-12-04 ENCOUNTER — Inpatient Hospital Stay (HOSPITAL_COMMUNITY)

## 2023-12-04 DIAGNOSIS — I10 Essential (primary) hypertension: Secondary | ICD-10-CM | POA: Diagnosis not present

## 2023-12-04 DIAGNOSIS — N186 End stage renal disease: Secondary | ICD-10-CM

## 2023-12-04 DIAGNOSIS — R7989 Other specified abnormal findings of blood chemistry: Secondary | ICD-10-CM

## 2023-12-04 DIAGNOSIS — K551 Chronic vascular disorders of intestine: Secondary | ICD-10-CM | POA: Diagnosis not present

## 2023-12-04 DIAGNOSIS — N2889 Other specified disorders of kidney and ureter: Secondary | ICD-10-CM | POA: Diagnosis not present

## 2023-12-04 DIAGNOSIS — D649 Anemia, unspecified: Secondary | ICD-10-CM

## 2023-12-04 LAB — BASIC METABOLIC PANEL WITH GFR
Anion gap: 18 — ABNORMAL HIGH (ref 5–15)
BUN: 27 mg/dL — ABNORMAL HIGH (ref 8–23)
CO2: 25 mmol/L (ref 22–32)
Calcium: 8.1 mg/dL — ABNORMAL LOW (ref 8.9–10.3)
Chloride: 95 mmol/L — ABNORMAL LOW (ref 98–111)
Creatinine, Ser: 3.39 mg/dL — ABNORMAL HIGH (ref 0.44–1.00)
GFR, Estimated: 14 mL/min — ABNORMAL LOW (ref >60)
Glucose, Bld: 105 mg/dL — ABNORMAL HIGH (ref 70–99)
Potassium: 3.7 mmol/L (ref 3.5–5.1)
Sodium: 138 mmol/L (ref 135–145)

## 2023-12-04 LAB — CBC
HCT: 20.7 % — ABNORMAL LOW (ref 36.0–46.0)
HCT: 35.3 % — ABNORMAL LOW (ref 36.0–46.0)
HCT: 36 % (ref 36.0–46.0)
Hemoglobin: 6.3 g/dL — CL (ref 12.0–15.0)
Hemoglobin: 11.3 g/dL — ABNORMAL LOW (ref 12.0–15.0)
Hemoglobin: 11.4 g/dL — ABNORMAL LOW (ref 12.0–15.0)
MCH: 31.3 pg (ref 26.0–34.0)
MCH: 30.2 pg (ref 26.0–34.0)
MCH: 30.2 pg (ref 26.0–34.0)
MCHC: 30.4 g/dL (ref 30.0–36.0)
MCHC: 32 g/dL (ref 30.0–36.0)
MCHC: 31.7 g/dL (ref 30.0–36.0)
MCV: 103 fL — ABNORMAL HIGH (ref 80.0–100.0)
MCV: 94.4 fL (ref 80.0–100.0)
MCV: 95.5 fL (ref 80.0–100.0)
Platelets: 131 K/uL — ABNORMAL LOW (ref 150–400)
Platelets: 117 K/uL — ABNORMAL LOW (ref 150–400)
Platelets: 110 K/uL — ABNORMAL LOW (ref 150–400)
RBC: 2.01 MIL/uL — ABNORMAL LOW (ref 3.87–5.11)
RBC: 3.74 MIL/uL — ABNORMAL LOW (ref 3.87–5.11)
RBC: 3.77 MIL/uL — ABNORMAL LOW (ref 3.87–5.11)
RDW: 18.4 % — ABNORMAL HIGH (ref 11.5–15.5)
RDW: 20.7 % — ABNORMAL HIGH (ref 11.5–15.5)
RDW: 21.1 % — ABNORMAL HIGH (ref 11.5–15.5)
WBC: 11.6 K/uL — ABNORMAL HIGH (ref 4.0–10.5)
WBC: 9.6 K/uL (ref 4.0–10.5)
WBC: 9.5 K/uL (ref 4.0–10.5)
nRBC: 0 % (ref 0.0–0.2)
nRBC: 0 % (ref 0.0–0.2)
nRBC: 0 % (ref 0.0–0.2)

## 2023-12-04 LAB — URINALYSIS, ROUTINE W REFLEX MICROSCOPIC
Bilirubin Urine: NEGATIVE
Glucose, UA: NEGATIVE mg/dL
Ketones, ur: NEGATIVE mg/dL
Nitrite: NEGATIVE
Protein, ur: 100 mg/dL — AB
Specific Gravity, Urine: 1.015 (ref 1.005–1.030)
WBC, UA: >50 WBC/hpf (ref 0–5)
pH: 7 (ref 5.0–8.0)

## 2023-12-04 LAB — PREPARE RBC (CROSSMATCH)

## 2023-12-04 LAB — HEPATITIS B SURFACE ANTIGEN: Hepatitis B Surface Ag: NONREACTIVE

## 2023-12-04 LAB — GLUCOSE, CAPILLARY: Glucose-Capillary: 113 mg/dL — ABNORMAL HIGH (ref 70–99)

## 2023-12-04 LAB — TROPONIN I (HIGH SENSITIVITY)
Troponin I (High Sensitivity): 424 ng/L (ref <18)
Troponin I (High Sensitivity): 462 ng/L (ref <18)

## 2023-12-04 LAB — POC OCCULT BLOOD, ED: Fecal Occult Bld: NEGATIVE

## 2023-12-04 MED ORDER — SENNOSIDES-DOCUSATE SODIUM 8.6-50 MG PO TABS: 1.0000 | ORAL_TABLET | Freq: Every evening | ORAL | Status: DC | PRN

## 2023-12-04 MED ORDER — IOHEXOL 350 MG/ML SOLN
75.0000 mL | Freq: Once | INTRAVENOUS | Status: AC | PRN
Start: 2023-12-04 — End: 2023-12-04
  Administered 2023-12-04: 75 mL via INTRAVENOUS

## 2023-12-04 MED ORDER — METOPROLOL TARTRATE 5 MG/5ML IV SOLN: 5.0000 mg | INTRAVENOUS | Status: DC | PRN

## 2023-12-04 MED ORDER — GLUCAGON HCL RDNA (DIAGNOSTIC) 1 MG IJ SOLR: 1.0000 mg | INTRAMUSCULAR | Status: DC | PRN

## 2023-12-04 MED ORDER — HALOPERIDOL LACTATE 5 MG/ML IJ SOLN
1.0000 mg | Freq: Four times a day (QID) | INTRAMUSCULAR | Status: DC | PRN
  Administered 2023-12-04: 1 mg via INTRAVENOUS
  Filled 2023-12-04: qty 1

## 2023-12-04 MED ORDER — ASPIRIN 325 MG PO TABS: 325.0000 mg | ORAL_TABLET | Freq: Once | ORAL | Status: DC

## 2023-12-04 MED ORDER — SODIUM CHLORIDE 0.9 % IV SOLN
2.0000 g | INTRAVENOUS | Status: DC
  Administered 2023-12-04 – 2023-12-06 (×3): 2 g via INTRAVENOUS
  Filled 2023-12-04 (×3): qty 20

## 2023-12-04 MED ORDER — CHLORHEXIDINE GLUCONATE CLOTH 2 % EX PADS: 6.0000 | MEDICATED_PAD | Freq: Every day | CUTANEOUS | Status: DC

## 2023-12-04 MED ORDER — HYDRALAZINE HCL 20 MG/ML IJ SOLN: 10.0000 mg | INTRAMUSCULAR | Status: DC | PRN

## 2023-12-04 MED ORDER — SODIUM CHLORIDE 0.9% IV SOLUTION: Freq: Once | INTRAVENOUS | Status: DC

## 2023-12-04 MED ORDER — ASPIRIN 81 MG PO TBEC
81.0000 mg | DELAYED_RELEASE_TABLET | Freq: Every day | ORAL | Status: DC
  Administered 2023-12-05 – 2023-12-06 (×2): 81 mg via ORAL
  Filled 2023-12-04 (×2): qty 1

## 2023-12-04 MED ORDER — GUAIFENESIN 100 MG/5ML PO LIQD: 5.0000 mL | ORAL | Status: DC | PRN

## 2023-12-04 MED ORDER — PANTOPRAZOLE SODIUM 40 MG IV SOLR
40.0000 mg | Freq: Two times a day (BID) | INTRAVENOUS | Status: DC
  Administered 2023-12-04 (×2): 40 mg via INTRAVENOUS
  Filled 2023-12-04 (×3): qty 10

## 2023-12-04 NOTE — Hospital Course (Addendum)
 Brief Narrative:   73 year old with history of CKD stage V/ESRD, DDD, DM 2, HTN, HLD presented to the hospital from dialysis center due to low blood pressure.  Unable to complete full session.  Upon arrival hemodynamically stable.  Assessment & Plan:  Hypotension Urinary tract infection - Blood pressure has now improved.  Follow urine culture, empiric IV Rocephin .  ESRD on hemodialysis - Nephrology consulted.  Okay with CT angio  Acute on chronic anemia Thrombocytopenia -No obvious signs of bleeding.  Baseline hemoglobin 10, continues to drop hemoglobin, this morning down to 6.3.  2 units of PRBC transfusion ordered. -Will obtain CT angio to rule out GI bleed.  Discussed with Dr Leigh.  Monitor hemoglobin after transfusion and see if her hemoglobin stays steady, if not or develops overt bleeding GI will be available for formal consultation.  Bone marrow biopsy 2020 showed hypercellular bone marrow with trilineage marrow process  Elevated troponin/NSTEMI type 2 - Suspect demand ischemia as patient is on dialysis.  Continue to monitor.  No chest pain.  Seen by cardiology team recommended echo.  Heparin  has been discontinued.  Essential hypertension -Norvasc , Coreg .  IV as needed  Diabetes mellitus type 2 - Not on any home medications.  History of PAD Foot ulcers/amputations - Aspirin  and statin  Cognitive impairment - On donepezil   History of CVA Hyperlipidemia - On aspirin  and statin   DVT prophylaxis: SCDs Start: 12/03/23 2242    Code Status: Full Code Family Communication:   Status is: Inpatient Remains inpatient appropriate because: Continue hospital stay for ongoing evaluation for anemia   PT Follow up Recs:   Subjective: Seen at bedside, denying any obvious signs of blood loss at this time. She denies having any prior endoscopic evaluation. Denies taking antiacid medications at home  Examination:  General exam: Appears calm and comfortable   Respiratory system: Clear to auscultation. Respiratory effort normal. Cardiovascular system: S1 & S2 heard, RRR. No JVD, murmurs, rubs, gallops or clicks. No pedal edema. Gastrointestinal system: Abdomen is nondistended, soft and nontender. No organomegaly or masses felt. Normal bowel sounds heard. Central nervous system: Alert and oriented. No focal neurological deficits. Extremities: Symmetric 5 x 5 power. Skin: No rashes, lesions or ulcers Psychiatry: Judgement and insight appear normal. Mood & affect appropriate.

## 2023-12-04 NOTE — ED Notes (Signed)
 RN received call from CCMD that pt was off monitor. Upon entering room, Pt was at edge of bed attempting to stand up. Cardiac monitoring was removed. Pt didn't realize she was at [facility]. RN redirect pt in bed, applied cardiac monitoring cords, and attached bed alarm.

## 2023-12-04 NOTE — Progress Notes (Addendum)
 PROGRESS NOTE    Bailey [name]  FMW:[id number] DOB: [birth year] DOA: 12/03/2023 PCP: Arloa Jarvis, NP    Brief Narrative:   73 year old with history of CKD stage V/ESRD, DDD, DM 2, HTN, HLD presented to the hospital from dialysis center due to low blood pressure.  Unable to complete full session.  Upon arrival hemodynamically stable.  Assessment & Plan:  Hypotension Urinary tract infection - Blood pressure has now improved.  Follow urine culture, empiric IV Rocephin .  ESRD on hemodialysis - Nephrology consulted.  Okay with CT angio  Acute on chronic anemia Thrombocytopenia -No obvious signs of bleeding.  Baseline hemoglobin 10, continues to drop hemoglobin, this morning down to 6.3.  2 units of PRBC transfusion ordered. -Will obtain CT angio to rule out GI bleed.  Discussed with Dr Leigh.  Monitor hemoglobin after transfusion and see if her hemoglobin stays steady, if not or develops overt bleeding GI will be available for formal consultation.  Bone marrow biopsy 2020 showed hypercellular bone marrow with trilineage marrow process  Elevated troponin/NSTEMI type 2 - Suspect demand ischemia as patient is on dialysis.  Continue to monitor.  No chest pain.  Seen by cardiology team recommended echo.  Heparin  has been discontinued.  Essential hypertension -Norvasc , Coreg .  IV as needed  Diabetes mellitus type 2 - Not on any home medications.  History of PAD Foot ulcers/amputations - Aspirin  and statin  Cognitive impairment - On donepezil   History of CVA Hyperlipidemia - On aspirin  and statin   DVT prophylaxis: SCDs Start: 12/03/23 2242    Code Status: Full Code Family Communication:   Status is: Inpatient Remains inpatient appropriate because: Continue hospital stay for ongoing evaluation for anemia   PT Follow up Recs:   Subjective: Seen at bedside, denying any obvious signs of blood loss at this time. She denies having any prior endoscopic  evaluation. Denies taking antiacid medications at home  Examination:  General exam: Appears calm and comfortable  Respiratory system: Clear to auscultation. Respiratory effort normal. Cardiovascular system: S1 & S2 heard, RRR. No JVD, murmurs, rubs, gallops or clicks. No pedal edema. Gastrointestinal system: Abdomen is nondistended, soft and nontender. No organomegaly or masses felt. Normal bowel sounds heard. Central nervous system: Alert and oriented. No focal neurological deficits. Extremities: Symmetric 5 x 5 power. Skin: No rashes, lesions or ulcers Psychiatry: Judgement and insight appear normal. Mood & affect appropriate.                Diet Orders (From admission, onward)     Start     Ordered   12/03/23 2243  Diet regular Room service appropriate? Yes; Fluid consistency: Thin  Diet effective now       Question Answer Comment  Room service appropriate? Yes   Fluid consistency: Thin      12/03/23 2242            Objective: Vitals:   12/04/23 0829 12/04/23 0900 12/04/23 1026 12/04/23 1027  BP: (!) 100/52 (!) 102/48 113/62 113/62  Pulse:  62 64 64  Resp:  17 16 16   Temp: (!) 97.5 F (36.4 C)  97.8 F (36.6 C) 97.8 F (36.6 C)  TempSrc:   Oral Oral  SpO2:  96% 98% 99%    Intake/Output Summary (Last 24 hours) at 12/04/2023 1059 Last data filed at 12/04/2023 1028 Gross per 24 hour  Intake 315 ml  Output --  Net 315 ml   There were no vitals filed for this visit.  Scheduled  Meds:  sodium chloride    Intravenous Once   [START ON 12/05/2023] aspirin  EC  81 mg Oral Daily   atorvastatin   40 mg Oral Daily   carvedilol   3.125 mg Oral BID WC   donepezil   5 mg Oral QPM   pantoprazole (PROTONIX) IV  40 mg Intravenous Q12H   Continuous Infusions:  cefTRIAXone  (ROCEPHIN )  IV Stopped (12/04/23 0452)    Nutritional status     There is no height or weight on file to calculate BMI.  Data Reviewed:   CBC: Recent Labs  Lab 12/03/23 1626  12/04/23 0120  WBC 8.8 11.6*  HGB 9.0* 6.3*  HCT 30.1* 20.7*  MCV 105.6* 103.0*  PLT 94* 131*   Basic Metabolic Panel: Recent Labs  Lab 12/03/23 1626 12/04/23 0120  NA 136 138  K 3.5 3.7  CL 95* 95*  CO2 27 25  GLUCOSE 100* 105*  BUN 23 27*  CREATININE 2.97* 3.39*  CALCIUM  8.2* 8.1*   GFR: CrCl cannot be calculated (Unknown ideal weight.). Liver Function Tests: Recent Labs  Lab 12/03/23 1626  AST 43*  ALT 136*  ALKPHOS 76  BILITOT 1.3*  PROT 6.7  ALBUMIN 3.2*   No results for input(s): LIPASE, AMYLASE in the last 168 hours. No results for input(s): AMMONIA in the last 168 hours. Coagulation Profile: Recent Labs  Lab 12/03/23 1626  INR 1.1   Cardiac Enzymes: No results for input(s): CKTOTAL, CKMB, CKMBINDEX, TROPONINI in the last 168 hours. BNP (last 3 results) No results for input(s): PROBNP in the last 8760 hours. HbA1C: No results for input(s): HGBA1C in the last 72 hours. CBG: Recent Labs  Lab 12/03/23 1745  GLUCAP 84   Lipid Profile: No results for input(s): CHOL, HDL, LDLCALC, TRIG, CHOLHDL, LDLDIRECT in the last 72 hours. Thyroid  Function Tests: No results for input(s): TSH, T4TOTAL, FREET4, T3FREE, THYROIDAB in the last 72 hours. Anemia Panel: Recent Labs    12/03/23 1817  FERRITIN 1,317*  TIBC 183*  IRON  37   Sepsis Labs: No results for input(s): PROCALCITON, LATICACIDVEN in the last 168 hours.  No results found for this or any previous visit (from the past 240 hours).       Radiology Studies: DG Chest Port 1 View Result Date: 12/03/2023 CLINICAL DATA:  Weakness EXAM: PORTABLE CHEST 1 VIEW COMPARISON:  Chest radiograph February 12, 2023. FINDINGS: Cardiomediastinal silhouette is enlarged and increased in size to prior. Aortic knob is calcified. Bilateral vascular congestion and prominent right hilum. Blunting of left costophrenic angle suggestive of small effusion/atelectasis. No acute  osseous abnormality. IMPRESSION: Cardiomegaly and pulmonary congestion, progressed to prior. New left small pleural effusion is suspected. Electronically Signed   By: Megan  Zare M.D.   On: 12/03/2023 18:50           LOS: 1 day   Time spent= 35 mins    Burgess JAYSON Dare, MD [facility]  If 7PM-7AM, please contact night-coverage  12/04/2023, 10:59 AM

## 2023-12-04 NOTE — ED Notes (Addendum)
 Verbal consent by phone received by pts daughter Bailey [name] for pt to received blood products. Pt unable to consent at this time due to confusion.

## 2023-12-04 NOTE — Progress Notes (Signed)
 Patient admitted to floor this pm, arrive from dialysis around 1835. On arrival to floor patient is A&Ox4 , is ambulatory with assist. Skin intact. Spa02 85% on room air, oxygen given, Spa02=95% on 2L nasal cannula.No acute distress noted. Report given to RN-Travis, RN ask to follow up with admission.

## 2023-12-04 NOTE — ED Notes (Signed)
 Call received from blood bank that patients previous blood type does not match patients current blood type. Delay in blood at this time.

## 2023-12-04 NOTE — Consult Note (Addendum)
 [facility] Renal Consultation Note    Indication for Consultation:  Management of ESRD/hemodialysis, anemia, hypertension/volume, and secondary hyperparathyroidism.  HPI: ELLEANNA [name] is a 73 y.o. female with ESRD, T2DM, HTN, PAD, Hx CVA, and baseline dementia who was admitted with hypotension.  Presented to ED on 10/10 via EMS from HD unit with hypotension/weakness. BP 109/50 on admit, afebrile and breathing well on RA. Labs with Na 136, K 3.5, BUN 23, Trop 441, WBC 8.8, Hgb 9, Plts 94. Cardiology consulted for elevated troponin. Felt possible NSTEMI. Echo pending, plan was IV heparin  per notes but then Hgb dropped. Repeat Trops remain high. Hgb dropped 9 -> 6.3 today, FOBT negative. Transfused 1U PRBCs. UA suspicious for UTI.  Seen in ED bed. Known dementia. Denies any CP, dyspnea or new symptoms today. On [place] O2 at the moment.  Dialyzes on MWF schedule at [facility]. Completed most of her HD prior to transfer to ED. LUE AVF has been used without recent issues.  Past Medical History:  Diagnosis Date   Anemia    patient preference - stopped iron     ARF (acute renal failure) 04/2016   dehydration   Chronic kidney disease    stage 5   DDD (degenerative disc disease), lumbar    Depression    Diabetes mellitus    Type II - pt preference - stopped lantus   Diabetic retinopathy (HCC)    Diverticulitis    History of kidney stones    passed- 6   Hypertension    Lacunar infarction (HCC)    Osteomyelitis (HCC)    Polyneuropathy    Vitamin B 12 deficiency 06/09/2017   Vitamin D deficiency    Wears partial dentures    upper   Past Surgical History:  Procedure Laterality Date   ABDOMINAL AORTOGRAM W/LOWER EXTREMITY Right 02/11/2023   Procedure: ABDOMINAL AORTOGRAM W/LOWER EXTREMITY;  Surgeon: Gretta Lonni PARAS, MD;  Location: [facility];  Service: Cardiovascular;  Laterality: Right;   AMPUTATION Right 08/14/2016   Procedure: RIGHT 1ST RAY AMPUTATION  MID-SHAFT;  Surgeon: Harden Jerona GAILS, MD;  Location: [facility] OR;  Service: Orthopedics;  Laterality: Right;   BASCILIC VEIN TRANSPOSITION Left 06/08/2019   Procedure: LEFT BRACHIOCEPHALIC VEIN CREATION;  Surgeon: Gretta Lonni PARAS, MD;  Location: [facility] OR;  Service: Vascular;  Laterality: Left;   CATARACT EXTRACTION  2018   INCISION AND DRAINAGE ABSCESS Left 02/11/2014   Procedure: INCISION AND DRAINAGE ABSCESS Left Buttock;  Surgeon: Krystal Russell, MD;  Location: [facility] ORS;  Service: General;  Laterality: Left;   INSERTION OF DIALYSIS CATHETER Left 09/07/2019   Procedure: INSERTION OF LEFT INTERNAL JUGULAR DIALYSIS CATHETER;  Surgeon: Oris Krystal FALCON, MD;  Location: [facility] OR;  Service: Vascular;  Laterality: Left;   IR REMOVAL TUN CV CATH W/O FL  09/04/2019   LAPAROSCOPIC SMALL BOWEL RESECTION N/A 05/09/2016   Procedure: LAPAROSCOPIC SMALL BOWEL RESECTION;  Surgeon: Elspeth Schultze, MD;  Location: [facility] ORS;  Service: General;  Laterality: N/A;   LAPAROSCOPY N/A 05/09/2016   Procedure: LAPAROSCOPY DIAGNOSTIC, LYSIS OF ADHESIONS, SMALL BOWEL RESECTION X 2;  Surgeon: Elspeth Schultze, MD;  Location: [facility] ORS;  Service: General;  Laterality: N/A;   TOE AMPUTATION     left foot great toe   VENOUS ANGIOPLASTY  07/22/2023   Procedure: VENOUS ANGIOPLASTY;  Surgeon: Gretta Lonni PARAS, MD;  Location: [facility];  Service: Cardiovascular;;  cephalic arch   Family History  Problem Relation Age of Onset  Diabetes Mother    Asthma Sister    Social History:  reports that she has never smoked. She has never used smokeless tobacco. She reports that she does not drink alcohol  and does not use drugs.  ROS: As per HPI otherwise negative.  Physical Exam: Vitals:   12/04/23 0829 12/04/23 0900 12/04/23 1026 12/04/23 1027  BP: (!) 100/52 (!) 102/48 113/62 113/62  Pulse:  62 64 64  Resp:  17 16 16   Temp: (!) 97.5 F (36.4 C)  97.8 F (36.6 C) 97.8 F (36.6 C)  TempSrc:   Oral Oral  SpO2:  96% 98% 99%     General: Well  developed, well nourished, in no acute distress. Baseline dementia. [place] O2 in place Head: Normocephalic, atraumatic, sclera non-icteric, mucus membranes are moist. Neck: Supple without lymphadenopathy/masses. JVD not elevated. Lungs: Clear in B upper lobes, faint R base rales Heart: RRR, 3/6 murmur Abdomen: Soft, non-tender, non-distended with normoactive bowel sounds.  Musculoskeletal:  Strength and tone appear normal for age. Lower extremities: No edema or ischemic changes, no open wounds. Neuro: Baseline dementia Dialysis Access: LUE AVF +t/b  No Known Allergies Prior to Admission medications   Medication Sig Start Date End Date Taking? Authorizing Provider  acetaminophen  (TYLENOL ) 500 MG tablet Take 500 mg by mouth every 6 (six) hours as needed for moderate pain (pain score 4-6).   Yes [provider]  amLODipine  (NORVASC ) 10 MG tablet Take 10 mg by mouth at bedtime.   Yes [provider]  ascorbic acid (VITAMIN C) 500 MG tablet Take 500 mg by mouth daily.   Yes [provider]  aspirin  EC 81 MG tablet Take 1 tablet (81 mg total) by mouth daily. Swallow whole. 10/02/22  Yes Rojelio Nest, DO  atorvastatin  (LIPITOR) 40 MG tablet TAKE 1 TABLET(40 MG) BY MOUTH DAILY 09/01/22  Yes Rush Nest, MD  carvedilol  (COREG ) 3.125 MG tablet Take 3.125 mg by mouth 2 (two) times daily. 01/22/23  Yes [provider]  donepezil  (ARICEPT ) 10 MG tablet Take 10 mg by mouth at bedtime.   Yes [provider]  multivitamin (RENA-VIT) TABS tablet Take 1 tablet by mouth daily.   Yes [provider]  ondansetron  (ZOFRAN -ODT) 4 MG disintegrating tablet Take 4 mg by mouth 3 (three) times daily as needed for nausea or vomiting. 10/08/23  Yes [provider]  sevelamer carbonate (RENVELA) 800 MG tablet Take 1,600 mg by mouth 3 (three) times daily.   Yes [provider]   Current Facility-Administered Medications  Medication Dose Route Frequency  Provider Last Rate Last Admin   0.9 %  sodium chloride  infusion (Manually program via Guardrails IV Fluids)   Intravenous Once Dorrell, Robert, MD   Held at 12/04/23 9177   acetaminophen  (TYLENOL ) tablet 650 mg  650 mg Oral Q6H PRN Dena Charleston, MD       Or   acetaminophen  (TYLENOL ) suppository 650 mg  650 mg Rectal Q6H PRN Dena Charleston, MD       NOREEN ON 12/05/2023] aspirin  EC tablet 81 mg  81 mg Oral Daily Osude, Nkiru C, MD       atorvastatin  (LIPITOR) tablet 40 mg  40 mg Oral Daily Dorrell, Robert, MD   40 mg at 12/04/23 1127   carvedilol  (COREG ) tablet 3.125 mg  3.125 mg Oral BID WC Dorrell, Robert, MD   3.125 mg at 12/03/23 2346   cefTRIAXone  (ROCEPHIN ) 2 g in sodium chloride  0.9 % 100 mL IVPB  2 g  Intravenous Q24H Dena Charleston, MD   Stopped at 12/04/23 [phone]   donepezil  (ARICEPT ) tablet 5 mg  5 mg Oral QPM Dena Charleston, MD   5 mg at 12/03/23 2346   glucagon (human recombinant) (GLUCAGEN) injection 1 mg  1 mg Intravenous PRN Amin, Ankit C, MD       guaiFENesin (ROBITUSSIN) 100 MG/5ML liquid 5 mL  5 mL Oral Q4H PRN Amin, Ankit C, MD       ondansetron  (ZOFRAN ) tablet 4 mg  4 mg Oral Q6H PRN Dena Charleston, MD       Or   ondansetron  (ZOFRAN ) injection 4 mg  4 mg Intravenous Q6H PRN Dorrell, Robert, MD       pantoprazole (PROTONIX) injection 40 mg  40 mg Intravenous Q12H Amin, Ankit C, MD       senna-docusate (Senokot-S) tablet 1 tablet  1 tablet Oral QHS PRN Amin, Ankit C, MD       sodium chloride  flush (NS) 0.9 % injection 3 mL  3 mL Intravenous PRN Gretta Lonni PARAS, MD       Current Outpatient Medications  Medication Sig Dispense Refill   acetaminophen  (TYLENOL ) 500 MG tablet Take 500 mg by mouth every 6 (six) hours as needed for moderate pain (pain score 4-6).     amLODipine  (NORVASC ) 10 MG tablet Take 10 mg by mouth at bedtime.     ascorbic acid (VITAMIN C) 500 MG tablet Take 500 mg by mouth daily.     aspirin  EC 81 MG tablet Take 1 tablet (81 mg total) by mouth daily.  Swallow whole. 120 tablet 0   atorvastatin  (LIPITOR) 40 MG tablet TAKE 1 TABLET(40 MG) BY MOUTH DAILY 90 tablet 0   carvedilol  (COREG ) 3.125 MG tablet Take 3.125 mg by mouth 2 (two) times daily.     donepezil  (ARICEPT ) 10 MG tablet Take 10 mg by mouth at bedtime.     multivitamin (RENA-VIT) TABS tablet Take 1 tablet by mouth daily.     ondansetron  (ZOFRAN -ODT) 4 MG disintegrating tablet Take 4 mg by mouth 3 (three) times daily as needed for nausea or vomiting.     sevelamer carbonate (RENVELA) 800 MG tablet Take 1,600 mg by mouth 3 (three) times daily.     Labs: Basic Metabolic Panel: Recent Labs  Lab 12/03/23 1626 12/04/23 0120  NA 136 138  K 3.5 3.7  CL 95* 95*  CO2 27 25  GLUCOSE 100* 105*  BUN 23 27*  CREATININE 2.97* 3.39*  CALCIUM  8.2* 8.1*   Liver Function Tests: Recent Labs  Lab 12/03/23 1626  AST 43*  ALT 136*  ALKPHOS 76  BILITOT 1.3*  PROT 6.7  ALBUMIN 3.2*   CBC: Recent Labs  Lab 12/03/23 1626 12/04/23 0120  WBC 8.8 11.6*  HGB 9.0* 6.3*  HCT 30.1* 20.7*  MCV 105.6* 103.0*  PLT 94* 131*   CBG: Recent Labs  Lab 12/03/23 1745  GLUCAP 84   Iron  Studies:  Recent Labs    12/03/23 1817  IRON  37  TIBC 183*  FERRITIN 1,317*   Studies/Results: DG Chest Port 1 View Result Date: 12/03/2023 CLINICAL DATA:  Weakness EXAM: PORTABLE CHEST 1 VIEW COMPARISON:  Chest radiograph February 12, 2023. FINDINGS: Cardiomediastinal silhouette is enlarged and increased in size to prior. Aortic knob is calcified. Bilateral vascular congestion and prominent right hilum. Blunting of left costophrenic angle suggestive of small effusion/atelectasis. No acute osseous abnormality. IMPRESSION: Cardiomegaly and pulmonary congestion, progressed to prior. New left small pleural effusion  is suspected. Electronically Signed   By: Megan  Zare M.D.   On: 12/03/2023 18:50   Dialysis Orders:  MWF - [facility] *Completed 3hr HD on 10/10 - post-HD weight 51.6kg 3:30hr, 350/500, EDW 48.7kg,  3K/2.5Ca bath, LUE AVF, heparin  2000 unit bolus - Mircera 75mcg IV q 2 weeks - last 9/29 - Hectoral 4mcg IV q HD  Assessment/Plan:  NSTEMI: Cardiology consulted, echo pending. ?UTI: Per primary.  ESRD: Completed most of HD on Friday prior to admit. Does have mild pulm edema on exam, but she is stable on [place] O2. Going for  CT angio and echo today - will circle back, possibly dialyze tonight if everything ok with work-up. More than likely next HD will be on Monday 10/13.  Hypertension/volume: Hypotensive on admit, home meds on hold. + pulm edema on imaging.  Anemia of ESRD: Hgb 9 -> 6.3 today, concerning for bleed. 1U RPBCs given, FOBT negative. For CT angio today.  Metabolic bone disease: Ca ok, Phos pending.  T2DM  Dementia  Izetta Boehringer, Bailey [name] 12/04/2023, 11:29 AM  [employer]

## 2023-12-04 NOTE — ED Notes (Signed)
 RN placed pt on 3 L nasal canula d/t fluctuating pulse ox readings 88-93.  Pt currently pulse ox 93%

## 2023-12-04 NOTE — ED Notes (Signed)
 Blood bank let nurse know blood is ready, will inform next shift during report.

## 2023-12-04 NOTE — Progress Notes (Signed)
 Progress Note  Patient Name: Bailey [name] Date of Encounter: 12/04/2023  Primary Cardiologist:   None   Subjective   She denies chest pain.  She denies SOB.  She does not report any evidence of GI bleeding.  She does have dementia.   Inpatient Medications    Scheduled Meds:  sodium chloride    Intravenous Once   amLODipine   10 mg Oral Daily   [START ON 12/05/2023] aspirin  EC  81 mg Oral Daily   atorvastatin   40 mg Oral Daily   carvedilol   3.125 mg Oral BID WC   donepezil   5 mg Oral QPM   Continuous Infusions:  cefTRIAXone  (ROCEPHIN )  IV Stopped (12/04/23 0452)   PRN Meds: acetaminophen  **OR** acetaminophen , ondansetron  **OR** ondansetron  (ZOFRAN ) IV, sodium chloride  flush   Vital Signs    Vitals:   12/04/23 0330 12/04/23 0450 12/04/23 0650 12/04/23 0658  BP: (!) 120/99     Pulse: 67   66  Resp: 12   18  Temp:   98.4 F (36.9 C)   TempSrc:   Oral   SpO2: (!) 89% 97%  93%   No intake or output data in the 24 hours ending 12/04/23 0740 There were no vitals filed for this visit.  Telemetry    NSR - Personally Reviewed  ECG    NSR, rate 68, ST depression and T wave inversion noted in the lateral leads.  This is not different than yesterday.  This is more pronounced than February 22, 2023.    - Personally Reviewed  Physical Exam   GEN: No acute distress.   Neck: No  JVD Cardiac: RRR, 3/6 pansystolic murmur, no diastolic murmurs, rubs, or gallops.  Respiratory:     Decreased breath sounds with scattered wheezing. GI: Soft, nontender, non-distended  MS: No  edema; No deformity. Neuro:  Nonfocal  Psych: Normal affect   Labs    Chemistry Recent Labs  Lab 12/03/23 1626 12/04/23 0120  NA 136 138  K 3.5 3.7  CL 95* 95*  CO2 27 25  GLUCOSE 100* 105*  BUN 23 27*  CREATININE 2.97* 3.39*  CALCIUM  8.2* 8.1*  PROT 6.7  --   ALBUMIN 3.2*  --   AST 43*  --   ALT 136*  --   ALKPHOS 76  --   BILITOT 1.3*  --   GFRNONAA 16* 14*  ANIONGAP 14 18*      Hematology Recent Labs  Lab 12/03/23 1626 12/04/23 0120  WBC 8.8 11.6*  RBC 2.85* 2.01*  HGB 9.0* 6.3*  HCT 30.1* 20.7*  MCV 105.6* 103.0*  MCH 31.6 31.3  MCHC 29.9* 30.4  RDW 18.3* 18.4*  PLT 94* 131*    Cardiac EnzymesNo results for input(s): TROPONINI in the last 168 hours. No results for input(s): TROPIPOC in the last 168 hours.   BNPNo results for input(s): BNP, PROBNP in the last 168 hours.   DDimer No results for input(s): DDIMER in the last 168 hours.   Radiology    DG Chest Port 1 View Result Date: 12/03/2023 CLINICAL DATA:  Weakness EXAM: PORTABLE CHEST 1 VIEW COMPARISON:  Chest radiograph February 12, 2023. FINDINGS: Cardiomediastinal silhouette is enlarged and increased in size to prior. Aortic knob is calcified. Bilateral vascular congestion and prominent right hilum. Blunting of left costophrenic angle suggestive of small effusion/atelectasis. No acute osseous abnormality. IMPRESSION: Cardiomegaly and pulmonary congestion, progressed to prior. New left small pleural effusion is suspected. Electronically Signed   By: Megan  Zare  M.D.   On: 12/03/2023 18:50    Cardiac Studies   Echo pending.   Patient Profile     73 y.o. female with  PAD completed by foot ulcers and amputation, ESRD MWF,  DM. HTN .  She was brought to the ED with hypotension in dialysis.  She is noted to have an elevated trop.   Assessment & Plan    Elevated troponin:  On ASA.  Heparin  discontinued.  Echo pending.   Trop elevated and flat.  He has a new anemia.  Not an interventional candidate given significant drop in Hgb.  Can hold ASA with if she is thought to have active bleeding.  EKG has changes as above but not different than yesterday and no active ischemic symptoms.   Anemia:  Receiving transfusion.  I agree that the goal is Hgb greater than 8.  Hypertension:  BP was low.  OK to continue beta blocker for SBP greater than 100.     For questions or updates, please contact  [facility] Please consult [email or web address] for contact info under Cardiology/STEMI.   Signed, Lynwood Schilling, MD  12/04/2023, 7:40 AM

## 2023-12-04 NOTE — H&P (Signed)
 History and Physical    Bailey [name] FMW:[id number] DOB: 1951/02/07 DOA: 12/03/2023  PCP: Arloa Jarvis, NP   Chief Complaint: hypotensive  HPI: Bailey [name] is a 73 y.o. female with medical history significant of CKD stage V on ESRD, prior stroke who presented to the emergency department due to inability to complete her dialysis session due to low blood pressure.  Patient denies any symptoms.  She was comfortable and denying complaints.  They were unable to complete dialysis session because her blood pressures were low so she was sent to the ER for further assessment.  On arrival she was afebrile hemodynamically stable.  Lab's were obtained on presentation which showed an creatinine 2.97, AST 43, ALT 836, WBC 8.8, hemoglobin 9.0, platelets 94,000, INR 1.1, troponin 441, 424.  Urinalysis was concerning for infection.  She was admitted for further workup.  Repeat hemoglobin came back 6.3 and patient had a white count 11.6.   Review of Systems: Review of Systems  Constitutional:  Negative for chills, fever and weight loss.  HENT: Negative.    Eyes: Negative.   Respiratory: Negative.    Cardiovascular: Negative.   Gastrointestinal: Negative.   Genitourinary: Negative.   Musculoskeletal: Negative.   Skin: Negative.   Neurological: Negative.   Endo/Heme/Allergies: Negative.   Psychiatric/Behavioral: Negative.       As per HPI otherwise 10 point review of systems negative.   No Known Allergies  Past Medical History:  Diagnosis Date   Anemia    patient preference - stopped iron     ARF (acute renal failure) 04/2016   dehydration   Chronic kidney disease    stage 5   DDD (degenerative disc disease), lumbar    Depression    Diabetes mellitus    Type II - pt preference - stopped lantus   Diabetic retinopathy (HCC)    Diverticulitis    History of kidney stones    passed- 6   Hypertension    Lacunar infarction (HCC)    Osteomyelitis (HCC)    Polyneuropathy    Vitamin B 12  deficiency 06/09/2017   Vitamin D deficiency    Wears partial dentures    upper    Past Surgical History:  Procedure Laterality Date   ABDOMINAL AORTOGRAM W/LOWER EXTREMITY Right 02/11/2023   Procedure: ABDOMINAL AORTOGRAM W/LOWER EXTREMITY;  Surgeon: Gretta Lonni PARAS, MD;  Location: [facility];  Service: Cardiovascular;  Laterality: Right;   AMPUTATION Right 08/14/2016   Procedure: RIGHT 1ST RAY AMPUTATION MID-SHAFT;  Surgeon: Harden Jerona GAILS, MD;  Location: [facility] OR;  Service: Orthopedics;  Laterality: Right;   BASCILIC VEIN TRANSPOSITION Left 06/08/2019   Procedure: LEFT BRACHIOCEPHALIC VEIN CREATION;  Surgeon: Gretta Lonni PARAS, MD;  Location: [facility] OR;  Service: Vascular;  Laterality: Left;   CATARACT EXTRACTION  2018   INCISION AND DRAINAGE ABSCESS Left 02/11/2014   Procedure: INCISION AND DRAINAGE ABSCESS Left Buttock;  Surgeon: Krystal Russell, MD;  Location: [facility] ORS;  Service: General;  Laterality: Left;   INSERTION OF DIALYSIS CATHETER Left 09/07/2019   Procedure: INSERTION OF LEFT INTERNAL JUGULAR DIALYSIS CATHETER;  Surgeon: Oris Krystal FALCON, MD;  Location: [facility] OR;  Service: Vascular;  Laterality: Left;   IR REMOVAL TUN CV CATH W/O FL  09/04/2019   LAPAROSCOPIC SMALL BOWEL RESECTION N/A 05/09/2016   Procedure: LAPAROSCOPIC SMALL BOWEL RESECTION;  Surgeon: Elspeth Schultze, MD;  Location: [facility] ORS;  Service: General;  Laterality: N/A;   LAPAROSCOPY N/A 05/09/2016   Procedure: LAPAROSCOPY DIAGNOSTIC, LYSIS  OF ADHESIONS, SMALL BOWEL RESECTION X 2;  Surgeon: Elspeth Schultze, MD;  Location: [facility] ORS;  Service: General;  Laterality: N/A;   TOE AMPUTATION     left foot great toe   VENOUS ANGIOPLASTY  07/22/2023   Procedure: VENOUS ANGIOPLASTY;  Surgeon: Gretta Lonni PARAS, MD;  Location: [facility];  Service: Cardiovascular;;  cephalic arch     reports that she has never smoked. She has never used smokeless tobacco. She reports that she does not drink alcohol  and does not use  drugs.  Family History  Problem Relation Age of Onset   Diabetes Mother    Asthma Sister     Prior to Admission medications   Medication Sig Start Date End Date Taking? Authorizing Provider  amLODipine  (NORVASC ) 10 MG tablet Take 10 mg by mouth daily.    [provider]  ascorbic acid (VITAMIN C) 500 MG tablet Take 500 mg by mouth daily.    [provider]  aspirin  EC 81 MG tablet Take 1 tablet (81 mg total) by mouth daily. Swallow whole. 10/02/22   Rojelio Nest, DO  atorvastatin  (LIPITOR) 40 MG tablet TAKE 1 TABLET(40 MG) BY MOUTH DAILY 09/01/22   Rush Nest, MD  AURYXIA  1 GM 210 MG(Fe) tablet Take 210 mg by mouth 3 (three) times daily with meals. 02/06/21   [provider]  carvedilol  (COREG ) 6.25 MG tablet Take 3.125 mg by mouth 2 (two) times daily. 01/22/23   [provider]  donepezil  (ARICEPT ) 5 MG tablet Take 1/2 pill daily for 4 weeks, then increase to 1 pill daily Patient taking differently: Take 5 mg by mouth every evening. 12/22/21   Rush Nest, MD  multivitamin (RENA-VIT) TABS tablet Take 1 tablet by mouth daily.    [provider]  olmesartan  (BENICAR ) 40 MG tablet Take 1/2 tablet (20 mg total) by mouth daily. Patient not taking: Reported on 02/05/2023 07/29/22       Physical Exam: Vitals:   12/03/23 2059 12/03/23 2156 12/04/23 0144 12/04/23 0150  BP: (!) 107/47  (!) 100/51   Pulse: 66  66   Resp: 18  (!) 21   Temp:  97.6 F (36.4 C)  98 F (36.7 C)  TempSrc:  Oral  Oral  SpO2: 93%  93%    Physical Exam Constitutional:      Appearance: She is normal weight.  HENT:     Head: Normocephalic.     Nose: Nose normal.     Mouth/Throat:     Mouth: Mucous membranes are moist.     Pharynx: Oropharynx is clear.  Eyes:     Conjunctiva/sclera: Conjunctivae normal.     Pupils: Pupils are equal, round, and reactive to light.  Cardiovascular:     Rate and Rhythm: Normal rate and regular rhythm.     Pulses: Normal pulses.      Heart sounds: Normal heart sounds.  Pulmonary:     Effort: Pulmonary effort is normal.  Abdominal:     General: Abdomen is flat. Bowel sounds are normal.     Palpations: Abdomen is soft.  Musculoskeletal:        General: Normal range of motion.     Cervical back: Normal range of motion.  Skin:    General: Skin is warm.     Capillary Refill: Capillary refill takes less than 2 seconds.  Neurological:     General: No focal deficit present.     Mental Status: She is alert. Mental status is at  baseline.  Psychiatric:        Mood and Affect: Mood normal.       Labs on Admission: I have personally reviewed the patients's labs and imaging studies.  Assessment/Plan Principal Problem:   ESRD (end stage renal disease) (HCC)   # Hypotension possibly related to urinary tract infection - Urinalysis showed many bacteria - Patient denies any symptoms  Plan: Continue ceftriaxone   # ESRD on hemodialysis-will need nephrology consultation in the morning  # Acute on chronic anemia-no overt bleeding.  Will transfuse to hemoglobin goal of 8  # Elevated troponin-likely related to dialysis and demand.  Will transfuse to hemoglobin goal of 8 and continue to monitor for chest pain  # Thrombocytopenia-continue to monitor  # Hypertension-continue amlodipine , carvedilol   # Cognitive impairment-continue donepezil   # History of stroke-continue aspirin   # Hyperlipidemia-continue Lipitor   Admission status: Inpatient Telemetry Medical  Certification: The appropriate patient status for this patient is INPATIENT. Inpatient status is judged to be reasonable and necessary in order to provide the required intensity of service to ensure the patient's safety. The patient's presenting symptoms, physical exam findings, and initial radiographic and laboratory data in the context of their chronic comorbidities is felt to place them at high risk for further clinical deterioration. Furthermore, it is not  anticipated that the patient will be medically stable for discharge from the hospital within 2 midnights of admission.   * I certify that at the point of admission it is my clinical judgment that the patient will require inpatient hospital care spanning beyond 2 midnights from the point of admission due to high intensity of service, high risk for further deterioration and high frequency of surveillance required.DEWAINE Lamar Dess MD [facility] If 7PM-7AM, please contact night-coverage [email or web address]  12/04/2023, 3:33 AM

## 2023-12-04 NOTE — Procedures (Signed)
 Received patient in bed to unit.  Alert and oriented.  Informed consent signed and in chart.   TX duration:  Patient tolerated well.  Transported back to the room  Alert, without acute distress.  Hand-off given to patient's nurse.   Access used: Left AVF Access issues: None  Total UF removed: 2 L Medication(s) given: None Post HD weight: N/A  Pt transported from ED. Pt alert to self but pleasantly confused. Pt tolerated tx well with no issues. Pt has been admitted and being transported to room on 7M-07.    Geremy Rister S Shanisha Lech [facility]

## 2023-12-04 NOTE — ED Notes (Signed)
 Patient transported to CT

## 2023-12-04 NOTE — Progress Notes (Signed)
 PHARMACY - ANTICOAGULATION CONSULT NOTE  Pharmacy Consult for heparin  Indication: chest pain/ACS  No Known Allergies  Patient Measurements:    Vital Signs: Temp: 98 F (36.7 C) (10/11 0150) Temp Source: Oral (10/11 0150) BP: 100/51 (10/11 0144) Pulse Rate: 66 (10/11 0144)  Labs: Recent Labs    12/03/23 1626 12/03/23 1817 12/04/23 0120  HGB 9.0*  --  6.3*  HCT 30.1*  --  20.7*  PLT 94*  --  131*  LABPROT 15.0  --   --   INR 1.1  --   --   CREATININE 2.97*  --  3.39*  TROPONINIHS  --  441*  --     CrCl cannot be calculated (Unknown ideal weight.).   Assessment: 89 yoF presented after being found hypotensive in dialysis. Pharmacy consulted to dose heparin  for ACS given elevated trop and EKG changes.  -Hgb 9> repeat Hgb 6.3 (Hgb 12 2 weeks ago), plts 94 >131 -No PTA anticoagulation -Discussed with cardiology will hold on initiating heparin  at this time  Goal of Therapy:  Heparin  level 0.3-0.7 units/ml Monitor platelets by anticoagulation protocol: Yes   Plan:  -Hold on starting heparin  gtt -F/u CBC and stable Hgb   Lynwood Poplar, PharmD, BCPS Clinical Pharmacist 12/04/2023 2:16 AM

## 2023-12-04 NOTE — Procedures (Signed)
 HD Note:  Some information was entered later than the data was gathered due to patient care needs. The stated time with the data is accurate.  Transported patient in bed to unit with two RNs as blood was being administered at the time.   Alert and oriented to self only.  Patient reassured often that she was safe and cared for.  Re oriented frequently.   Informed consent obtained from patient's son via phone with 2 nurses as witness, signed and in chart.   Access used: upper left arm fistula Access issues: None  Patient tolerated treatment well.  Patient did require reminding that she needed to not attempt to rise from the bed.  Patient pleasant with redirection.  TX duration:  Alert, without acute distress.  Total UF removed: 2000 ml  Hand-off given to patient's nurse.   Transported back to the room   Palestine Mosco L. Lenon, RN [facility].

## 2023-12-05 ENCOUNTER — Encounter (HOSPITAL_COMMUNITY): Payer: Self-pay | Admitting: Internal Medicine

## 2023-12-05 ENCOUNTER — Inpatient Hospital Stay (HOSPITAL_COMMUNITY)

## 2023-12-05 ENCOUNTER — Encounter: Payer: Self-pay | Admitting: Hematology and Oncology

## 2023-12-05 ENCOUNTER — Other Ambulatory Visit (HOSPITAL_COMMUNITY): Payer: Self-pay

## 2023-12-05 DIAGNOSIS — R7989 Other specified abnormal findings of blood chemistry: Secondary | ICD-10-CM | POA: Diagnosis not present

## 2023-12-05 DIAGNOSIS — N186 End stage renal disease: Secondary | ICD-10-CM | POA: Diagnosis not present

## 2023-12-05 DIAGNOSIS — I428 Other cardiomyopathies: Secondary | ICD-10-CM | POA: Diagnosis not present

## 2023-12-05 DIAGNOSIS — I10 Essential (primary) hypertension: Secondary | ICD-10-CM | POA: Diagnosis not present

## 2023-12-05 DIAGNOSIS — D649 Anemia, unspecified: Secondary | ICD-10-CM | POA: Diagnosis not present

## 2023-12-05 LAB — MAGNESIUM: Magnesium: 2 mg/dL (ref 1.7–2.4)

## 2023-12-05 LAB — TYPE AND SCREEN
ABO/RH(D): A NEG
Antibody Screen: NEGATIVE
Unit division: 0
Unit division: 0
Weak D: POSITIVE

## 2023-12-05 LAB — BASIC METABOLIC PANEL WITH GFR
Anion gap: 20 — ABNORMAL HIGH (ref 5–15)
BUN: 26 mg/dL — ABNORMAL HIGH (ref 8–23)
CO2: 24 mmol/L (ref 22–32)
Calcium: 8.6 mg/dL — ABNORMAL LOW (ref 8.9–10.3)
Chloride: 94 mmol/L — ABNORMAL LOW (ref 98–111)
Creatinine, Ser: 3.48 mg/dL — ABNORMAL HIGH (ref 0.44–1.00)
GFR, Estimated: 13 mL/min — ABNORMAL LOW (ref >60)
Glucose, Bld: 119 mg/dL — ABNORMAL HIGH (ref 70–99)
Potassium: 4.4 mmol/L (ref 3.5–5.1)
Sodium: 138 mmol/L (ref 135–145)

## 2023-12-05 LAB — BPAM RBC
Blood Product Expiration Date: 202510282359
Blood Product Expiration Date: 202510282359
ISSUE DATE / TIME: 202510110748
ISSUE DATE / TIME: 202510111230
Unit Type and Rh: 600
Unit Type and Rh: 600

## 2023-12-05 LAB — ECHOCARDIOGRAM COMPLETE
AR max vel: 0.39 cm2
AV Area VTI: 0.42 cm2
AV Area mean vel: 0.38 cm2
AV Mean grad: 47 mmHg
AV Peak grad: 75 mmHg
Ao pk vel: 4.33 m/s
Area-P 1/2: 2.22 cm2
Est EF: 55
Height: 64 in
MV VTI: 0.78 cm2
S' Lateral: 3.91 cm
Weight: 1728.41 [oz_av]

## 2023-12-05 LAB — CBC
HCT: 34.1 % — ABNORMAL LOW (ref 36.0–46.0)
Hemoglobin: 11 g/dL — ABNORMAL LOW (ref 12.0–15.0)
MCH: 30.6 pg (ref 26.0–34.0)
MCHC: 32.3 g/dL (ref 30.0–36.0)
MCV: 94.7 fL (ref 80.0–100.0)
Platelets: 101 K/uL — ABNORMAL LOW (ref 150–400)
RBC: 3.6 MIL/uL — ABNORMAL LOW (ref 3.87–5.11)
RDW: 20.7 % — ABNORMAL HIGH (ref 11.5–15.5)
WBC: 8.1 K/uL (ref 4.0–10.5)
nRBC: 0 % (ref 0.0–0.2)

## 2023-12-05 LAB — PHOSPHORUS: Phosphorus: 5.4 mg/dL — ABNORMAL HIGH (ref 2.5–4.6)

## 2023-12-05 LAB — MRSA NEXT GEN BY PCR, NASAL: MRSA by PCR Next Gen: NOT DETECTED

## 2023-12-05 MED ORDER — PANTOPRAZOLE SODIUM 40 MG PO TBEC
40.0000 mg | DELAYED_RELEASE_TABLET | Freq: Every day | ORAL | 0 refills | Status: DC
  Filled 2023-12-05: qty 30, 30d supply, fill #0

## 2023-12-05 MED ORDER — PANTOPRAZOLE SODIUM 40 MG PO TBEC
40.0000 mg | DELAYED_RELEASE_TABLET | Freq: Two times a day (BID) | ORAL | Status: DC
Start: 2023-12-05 — End: 2023-12-06
  Administered 2023-12-05 – 2023-12-06 (×3): 40 mg via ORAL
  Filled 2023-12-05 (×3): qty 1

## 2023-12-05 NOTE — Progress Notes (Signed)
 [facility] Progress Note   Subjective:   Seen in room. Curled up, says she is tired. No CP/dyspnea. We did do a short HD yesterday, 2L off. CT angio yesterday without any GI bleed and Hgb much improved today after 2U PRBCs.  Objective Vitals:   12/04/23 1900 12/04/23 1919 12/05/23 0441 12/05/23 0909  BP:  (!) 112/57 99/69 (!) 115/54  Pulse:  64 61 (!) 58  Resp:  18 20   Temp:  97.6 F (36.4 C) 97.6 F (36.4 C) 97.9 F (36.6 C)  TempSrc:  Oral Oral Oral  SpO2:  100% 98% 99%  Weight: 49 kg     Height: 5' 4 (1.626 m)      Physical Exam General: Well appearing, NAD. [place] O2 in place. Baseline severe dementia Heart: RRR, 2/6 murmur Lungs: CTAB Abdomen: soft Extremities: no LE edema Dialysis Access: LUE AVF +t/b  Additional Objective Labs: Basic Metabolic Panel: Recent Labs  Lab 12/03/23 1626 12/04/23 0120  NA 136 138  K 3.5 3.7  CL 95* 95*  CO2 27 25  GLUCOSE 100* 105*  BUN 23 27*  CREATININE 2.97* 3.39*  CALCIUM  8.2* 8.1*   Liver Function Tests: Recent Labs  Lab 12/03/23 1626  AST 43*  ALT 136*  ALKPHOS 76  BILITOT 1.3*  PROT 6.7  ALBUMIN 3.2*   CBC: Recent Labs  Lab 12/03/23 1626 12/04/23 0120 12/04/23 0811 12/04/23 2129 12/05/23 0447  WBC 8.8 11.6* 9.6 9.5 8.1  HGB 9.0* 6.3* 11.3* 11.4* 11.0*  HCT 30.1* 20.7* 35.3* 36.0 34.1*  MCV 105.6* 103.0* 94.4 95.5 94.7  PLT 94* 131* 117* 110* 101*   CBG: Recent Labs  Lab 12/03/23 1745 12/04/23 1834  GLUCAP 84 113*   Iron  Studies:  Recent Labs    12/03/23 1817  IRON  37  TIBC 183*  FERRITIN 1,317*   Studies/Results: CT ANGIO GI BLEED Result Date: 12/04/2023 EXAM: CTA ABDOMEN AND PELVIS WITH CONTRAST 12/04/2023 12:06:54 PM TECHNIQUE: CTA images of the abdomen and pelvis with 75 mL of iohexol  (OMNIPAQUE ) 350 MG/ML intravenous contrast. Three-dimensional MIP/volume rendered formations were performed. Automated exposure control, iterative reconstruction, and/or weight based  adjustment of the mA/kV was utilized to reduce the radiation dose to as low as reasonably achievable. COMPARISON: Renal stone protocol 05/10/2021 and CT abdomen and pelvis with contrast 04/27/2021. CLINICAL HISTORY: Rule out GI bleed. Chief complaints: Hypotension. FINDINGS: VASCULATURE: GI BLEED: No active extravasation of contrast within the GI tract. AORTA: Atherosclerotic calcifications are present in the abdominal aorta. No aneurysm is present. No dissection. CELIAC TRUNK: Calcifications at the origin of the celiac artery without significant stenosis. SUPERIOR MESENTERIC ARTERY: Calcifications at the origin of the SMA result in less than 50% stenosis relative to the more distal vessel. Diffuse distal calcifications are present. INFERIOR MESENTERIC ARTERY: Atherosclerotic calcifications are present in the IMA. The origin is unremarkable. RENAL ARTERIES: Calcifications are present at the origin of the renal arteries, left greater than right. Calcifications extend into the arteries bilaterally without focal stenosis or aneurysm. ILIAC ARTERIES: No acute finding. No occlusion or significant stenosis. ABDOMEN/PELVIS: LOWER CHEST: The heart is mildly enlarged. Stable right effusion and associated atelectasis is present. Mild-dependent atelectasis is present bilaterally. LIVER: The liver is unremarkable. GALLBLADDER AND BILE DUCTS: Gallbladder is unremarkable. No biliary ductal dilatation. SPLEEN: The spleen is unremarkable. PANCREAS: The pancreas is unremarkable. ADRENAL GLANDS: Bilateral adrenal glands demonstrate no acute abnormality. KIDNEYS, URETERS AND BLADDER: Renal atrophy is present bilaterally. No discrete lesions are present. No  stones in the kidneys or ureters. No hydronephrosis. No perinephric or periureteral stranding. Urinary bladder is unremarkable. GI AND BOWEL: Stomach and duodenal sweep demonstrate no acute abnormality. There is no bowel obstruction. No abnormal bowel wall thickening or distension.  REPRODUCTIVE: Reproductive organs are unremarkable. PERITONEUM AND RETROPERITONEUM: No ascites or free air. LYMPH NODES: No lymphadenopathy. BONES AND SOFT TISSUES: No acute abnormality of the bones. No acute soft tissue abnormality. IMPRESSION: 1. No active GI bleeding. 2. Atherosclerotic disease with less than 50% stenosis at the SMA origin; no aneurysm or hemodynamically significant stenosis elsewhere. Electronically signed by: Lonni Necessary MD 12/04/2023 12:39 PM EDT RP Workstation: [id number]   DG Chest Port 1 View Result Date: 12/03/2023 CLINICAL DATA:  Weakness EXAM: PORTABLE CHEST 1 VIEW COMPARISON:  Chest radiograph February 12, 2023. FINDINGS: Cardiomediastinal silhouette is enlarged and increased in size to prior. Aortic knob is calcified. Bilateral vascular congestion and prominent right hilum. Blunting of left costophrenic angle suggestive of small effusion/atelectasis. No acute osseous abnormality. IMPRESSION: Cardiomegaly and pulmonary congestion, progressed to prior. New left small pleural effusion is suspected. Electronically Signed   By: Megan  Zare M.D.   On: 12/03/2023 18:50   Medications:  cefTRIAXone  (ROCEPHIN )  IV 2 g (12/05/23 0334)    sodium chloride    Intravenous Once   aspirin  EC  81 mg Oral Daily   atorvastatin   40 mg Oral Daily   carvedilol   3.125 mg Oral BID WC   Chlorhexidine  Gluconate Cloth  6 each Topical Q0600   donepezil   5 mg Oral QPM   pantoprazole (PROTONIX) IV  40 mg Intravenous Q12H    Dialysis Orders MWF - AF *Completed 3hr HD on 10/10 - post-HD weight 51.6kg 3:30hr, 350/500, EDW 48.7kg, 3K/2.5Ca bath, LUE AVF, heparin  2000 unit bolus - Mircera 75mcg IV q 2 weeks - last 9/29 - Hectoral 4mcg IV q HD   Assessment/Plan:  NSTEMI: Cardiology consulted, echo pending. ?UTI: Getting Ceftriaxone  course.  ESRD: Usual MWF schedule. S/p HD yesterday evening for pulm edema, 2L off. Next HD tomorrow (Monday 10/13).  Hypertension/volume: Hypotensive on  admit, home meds held. Pulm edema on imaging - better with HD.  Anemia of ESRD: Hgb 9 -> 6.3 today, concerning for bleed. 2U PRBCs. CT angio negative. Hgb much better today.  Metabolic bone disease: Ca ok, Phos pending.  T2DM  Dementia     Izetta Boehringer, DEVONNA 12/05/2023, 9:11 AM  [employer]

## 2023-12-05 NOTE — Evaluation (Signed)
 Physical Therapy Evaluation Patient Details Name: REATA PETROV MRN: [id number] DOB: [birth year] Today's Date: 12/05/2023  History of Present Illness  73 year old female  presented to the hospital from dialysis center due to low blood pressure.  Unable to complete full session.  Upon arrival hemodynamically stable.  She was found to have urinary tract infection and slight elevation in troponins. Past medical history of CKD stage V/ESRD, DDD, DM 2, HTN, HLD.  Clinical Impression  Pt presents with admitting diagnosis above. Pt today was able to ambulate in hallway with RW CGA. PTA pt reports living at home with her husband where she was Mod I with a rollator however family does endorse a history of falls. Per family in room, plan is to move pt in with them once she is discharged. Recommend HHPT upon DC with RW. PT will continue to follow. Pt would benefit from continued mobility with mobility specialist during acute stay.          If plan is discharge home, recommend the following: A little help with walking and/or transfers;A little help with bathing/dressing/bathroom;Assistance with cooking/housework;Direct supervision/assist for medications management;Assist for transportation;Help with stairs or ramp for entrance;Supervision due to cognitive status   Can travel by private vehicle        Equipment Recommendations Rolling walker (2 wheels)  Recommendations for Other Services  OT consult    Functional Status Assessment Patient has had a recent decline in their functional status and demonstrates the ability to make significant improvements in function in a reasonable and predictable amount of time.     Precautions / Restrictions Precautions Precautions: Fall Recall of Precautions/Restrictions: Intact Restrictions Weight Bearing Restrictions Per Provider Order: No      Mobility  Bed Mobility Overal bed mobility: Needs Assistance Bed Mobility: Supine to Sit, Sit to Supine      Supine to sit: Supervision Sit to supine: Supervision        Transfers Overall transfer level: Needs assistance Equipment used: Rolling walker (2 wheels) Transfers: Sit to/from Stand Sit to Stand: Contact guard assist           General transfer comment: Cues for hand placement    Ambulation/Gait Ambulation/Gait assistance: Contact guard assist Gait Distance (Feet): 40 Feet Assistive device: Rolling walker (2 wheels) Gait Pattern/deviations: Decreased stride length, Step-through pattern, Trunk flexed Gait velocity: decreased     General Gait Details: Slowed step through pattern. no LOB noted.  Stairs            Wheelchair Mobility     Tilt Bed    Modified Rankin (Stroke Patients Only)       Balance Overall balance assessment: Mild deficits observed, not formally tested                                           Pertinent Vitals/Pain Pain Assessment Pain Assessment: No/denies pain    Home Living Family/patient expects to be discharged to:: Private residence Living Arrangements: Children Available Help at Discharge: Family;Available 24 hours/day Type of Home: House Home Access: Stairs to enter   Entrance Stairs-Number of Steps: 1 Alternate Level Stairs-Number of Steps:  (Chair lift) Home Layout: Two level Home Equipment: Educational psychologist (4 wheels);Toilet riser Additional Comments: Plans to move in with son and daughter in law.    Prior Function Prior Level of Function : Needs assist  Mobility Comments: Per family pt mostly sedentary however uses Rollator for household distances ADLs Comments: Supervision and cues from family     Extremity/Trunk Assessment   Upper Extremity Assessment Upper Extremity Assessment: Overall WFL for tasks assessed    Lower Extremity Assessment Lower Extremity Assessment: Overall WFL for tasks assessed    Cervical / Trunk Assessment Cervical / Trunk Assessment: Kyphotic   Communication   Communication Communication: No apparent difficulties    Cognition Arousal: Alert Behavior During Therapy: Flat affect                           PT - Cognition Comments: Per family in room pt has been more confused lately. A&Ox3 today. Following commands: Intact       Cueing Cueing Techniques: Verbal cues, Tactile cues     General Comments General comments (skin integrity, edema, etc.): SpO2 96% on 1L. Difficulty obtaining pleth due to cold hands.    Exercises     Assessment/Plan    PT Assessment Patient needs continued PT services  PT Problem List Decreased strength;Decreased range of motion;Decreased activity tolerance;Decreased balance;Decreased mobility;Decreased coordination;Decreased cognition;Decreased knowledge of use of DME;Decreased safety awareness;Decreased knowledge of precautions;Cardiopulmonary status limiting activity       PT Treatment Interventions DME instruction;Gait training;Stair training;Functional mobility training;Therapeutic activities;Balance training;Therapeutic exercise;Neuromuscular re-education;Cognitive remediation;Patient/family education    PT Goals (Current goals can be found in the Care Plan section)  Acute Rehab PT Goals Patient Stated Goal: To get stronger PT Goal Formulation: With patient Time For Goal Achievement: 12/19/23 Potential to Achieve Goals: Good    Frequency Min 2X/week     Co-evaluation               AM-PAC PT 6 Clicks Mobility  Outcome Measure Help needed turning from your back to your side while in a flat bed without using bedrails?: A Little Help needed moving from lying on your back to sitting on the side of a flat bed without using bedrails?: A Little Help needed moving to and from a bed to a chair (including a wheelchair)?: A Little Help needed standing up from a chair using your arms (e.g., wheelchair or bedside chair)?: A Little Help needed to walk in hospital room?: A  Little Help needed climbing 3-5 steps with a railing? : A Little 6 Click Score: 18    End of Session Equipment Utilized During Treatment: Gait belt;Oxygen Activity Tolerance: Patient tolerated treatment well Patient left: in bed;with call bell/phone within reach;with family/visitor present Nurse Communication: Mobility status PT Visit Diagnosis: Other abnormalities of gait and mobility (R26.89)    Time: 8474-8452 PT Time Calculation (min) (ACUTE ONLY): 22 min   Charges:   PT Evaluation $PT Eval Moderate Complexity: 1 Mod   PT General Charges $$ ACUTE PT VISIT: 1 Visit         Mehr Depaoli B, PT, DPT Acute Rehab Services [phone]   Yanett Conkright 12/05/2023, 4:32 PM

## 2023-12-05 NOTE — Progress Notes (Signed)
  Echocardiogram 2D Echocardiogram has been performed.  Tinnie FORBES Gosling RDCS 12/05/2023, 12:41 PM

## 2023-12-05 NOTE — Progress Notes (Signed)
 PHARMACIST - PHYSICIAN COMMUNICATION  DR:   Caleen  CONCERNING: IV to Oral Route Change Policy  RECOMMENDATION: This patient is receiving protonix by the intravenous route.  Based on criteria approved by the Pharmacy and Therapeutics Committee, the intravenous medication(s) is/are being converted to the equivalent oral dose form(s).   DESCRIPTION: These criteria include: The patient is eating (either orally or via tube) and/or has been taking other orally administered medications for a least 24 hours The patient has no evidence of active gastrointestinal bleeding or impaired GI absorption (gastrectomy, short bowel, patient on TNA or NPO).  If you have questions about this conversion, please contact the Pharmacy Department  []   [phone] )  [facility] []   [phone] )  [facility] [x]   [phone] )  [facility] []   [phone] )  [facility] []   [phone] )  [facility]

## 2023-12-05 NOTE — Progress Notes (Signed)
 PROGRESS NOTE    Bailey [name]  FMW:[id number] DOB: 28-Jul-1950 DOA: 12/03/2023 PCP: Arloa Jarvis, NP    Brief Narrative:   73 year old with history of CKD stage V/ESRD, DDD, DM 2, HTN, HLD presented to the hospital from dialysis center due to low blood pressure.  Unable to complete full session.  Upon arrival hemodynamically stable.  She was found to have urinary tract infection and slight elevation in troponins.  Started on IV Rocephin , nephrology and cardiology were consulted.  She also had drop in hemoglobin without evidence of any bleeding therefore required PRBC transfusion, CTA GI bleed was negative.  Overall she was feeling well in the hospital tolerated IV Rocephin , blood pressure improved.  She was also dialyzed during this hospitalization.  There was a transient drop in hemoglobin work workup including CTA GI bleed was negative.  Case was discussed with GI who recommended outpatient follow-up.  Assessment & Plan:  Hypotension Urinary tract infection - Blood pressure has now improved.  Urine cultures not sent, already on IV Rocephin .  ESRD on hemodialysis - Nephrology consulted.  Status post HD 10/11.  Acute on chronic anemia Thrombocytopenia -No obvious signs of bleeding.  Baseline hemoglobin 10, continues to drop hemoglobin, this morning down to 6.3.  2 units of PRBC transfusion ordered.  Hemoglobin is now back up to 11.0 and stable - CTA GI bleed is negative for any active bleeding - Discussed with GI, Dr. Leigh.  As long as hemoglobin remained stable, patient can follow-up outpatient but will be available if hemodynamically becomes unstable.  Bone marrow biopsy 2020 showed hypercellular bone marrow with trilineage marrow process  Elevated troponin/NSTEMI type 2 - Suspect demand ischemia as patient is on dialysis.  Seen by cardiology, echocardiogram is pending  Essential hypertension -Norvasc , Coreg .  IV as needed  Diabetes mellitus type 2 - Not on any home  medications.  History of PAD Foot ulcers/amputations - Aspirin  and statin  Cognitive impairment - On donepezil   History of CVA Hyperlipidemia - On aspirin  and statin   DVT prophylaxis: SCDs Start: 12/03/23 2242    Code Status: Full Code Family Communication:   Status is: Inpatient Remains inpatient appropriate because: Discharge pending echocardiogram and cardiology clearance PT Follow up Recs:   Subjective: Seen at bedside does not have any complaints at this time.  Examination:  General exam: Appears calm and comfortable  Respiratory system: Clear to auscultation. Respiratory effort normal. Cardiovascular system: S1 & S2 heard, RRR. No JVD, murmurs, rubs, gallops or clicks. No pedal edema. Gastrointestinal system: Abdomen is nondistended, soft and nontender. No organomegaly or masses felt. Normal bowel sounds heard. Central nervous system: Alert and oriented. No focal neurological deficits. Extremities: Symmetric 5 x 5 power. Skin: No rashes, lesions or ulcers Psychiatry: Judgement and insight appear normal. Mood & affect appropriate.                Diet Orders (From admission, onward)     Start     Ordered   12/03/23 2243  Diet regular Room service appropriate? Yes; Fluid consistency: Thin  Diet effective now       Question Answer Comment  Room service appropriate? Yes   Fluid consistency: Thin      12/03/23 2242            Objective: Vitals:   12/04/23 1919 12/05/23 0441 12/05/23 0909 12/05/23 0945  BP: (!) 112/57 99/69 (!) 115/54   Pulse: 64 61 (!) 58 62  Resp: 18 20  Temp: 97.6 F (36.4 C) 97.6 F (36.4 C) 97.9 F (36.6 C)   TempSrc: Oral Oral Oral   SpO2: 100% 98% 99%   Weight:      Height:        Intake/Output Summary (Last 24 hours) at 12/05/2023 1102 Last data filed at 12/05/2023 0900 Gross per 24 hour  Intake 866.41 ml  Output 2000 ml  Net -1133.59 ml   Filed Weights   12/04/23 1900  Weight: 49 kg    Scheduled  Meds:  sodium chloride    Intravenous Once   aspirin  EC  81 mg Oral Daily   atorvastatin   40 mg Oral Daily   carvedilol   3.125 mg Oral BID WC   Chlorhexidine  Gluconate Cloth  6 each Topical Q0600   donepezil   5 mg Oral QPM   pantoprazole  40 mg Oral BID   Continuous Infusions:  cefTRIAXone  (ROCEPHIN )  IV 2 g (12/05/23 0334)    Nutritional status     Body mass index is 18.54 kg/m.  Data Reviewed:   CBC: Recent Labs  Lab 12/03/23 1626 12/04/23 0120 12/04/23 0811 12/04/23 2129 12/05/23 0447  WBC 8.8 11.6* 9.6 9.5 8.1  HGB 9.0* 6.3* 11.3* 11.4* 11.0*  HCT 30.1* 20.7* 35.3* 36.0 34.1*  MCV 105.6* 103.0* 94.4 95.5 94.7  PLT 94* 131* 117* 110* 101*   Basic Metabolic Panel: Recent Labs  Lab 12/03/23 1626 12/04/23 0120 12/05/23 0834  NA 136 138 138  K 3.5 3.7 4.4  CL 95* 95* 94*  CO2 27 25 24   GLUCOSE 100* 105* 119*  BUN 23 27* 26*  CREATININE 2.97* 3.39* 3.48*  CALCIUM  8.2* 8.1* 8.6*  MG  --   --  2.0  PHOS  --   --  5.4*   GFR: Estimated Creatinine Clearance: 10.8 mL/min (A) (by C-G formula based on SCr of 3.48 mg/dL (H)). Liver Function Tests: Recent Labs  Lab 12/03/23 1626  AST 43*  ALT 136*  ALKPHOS 76  BILITOT 1.3*  PROT 6.7  ALBUMIN 3.2*   No results for input(s): LIPASE, AMYLASE in the last 168 hours. No results for input(s): AMMONIA in the last 168 hours. Coagulation Profile: Recent Labs  Lab 12/03/23 1626  INR 1.1   Cardiac Enzymes: No results for input(s): CKTOTAL, CKMB, CKMBINDEX, TROPONINI in the last 168 hours. BNP (last 3 results) No results for input(s): PROBNP in the last 8760 hours. HbA1C: No results for input(s): HGBA1C in the last 72 hours. CBG: Recent Labs  Lab 12/03/23 1745 12/04/23 1834  GLUCAP 84 113*   Lipid Profile: No results for input(s): CHOL, HDL, LDLCALC, TRIG, CHOLHDL, LDLDIRECT in the last 72 hours. Thyroid  Function Tests: No results for input(s): TSH, T4TOTAL, FREET4,  T3FREE, THYROIDAB in the last 72 hours. Anemia Panel: Recent Labs    12/03/23 1817  FERRITIN 1,317*  TIBC 183*  IRON  37   Sepsis Labs: No results for input(s): PROCALCITON, LATICACIDVEN in the last 168 hours.  Recent Results (from the past 240 hours)  MRSA Next Gen by PCR, Nasal     Status: None   Collection Time: 12/05/23  6:29 AM   Specimen: Nasal Mucosa; Nasal Swab  Result Value Ref Range Status   MRSA by PCR Next Gen NOT DETECTED NOT DETECTED Final    Comment: (NOTE) The GeneXpert MRSA Assay (FDA approved for NASAL specimens only), is one component of a comprehensive MRSA colonization surveillance program. It is not intended to diagnose MRSA infection nor to guide or  monitor treatment for MRSA infections. Test performance is not FDA approved in patients less than 27 years old. Performed at [facility], [address], [place], [place] [address]          Radiology Studies: CT ANGIO GI BLEED Result Date: 12/04/2023 EXAM: CTA ABDOMEN AND PELVIS WITH CONTRAST 12/04/2023 12:06:54 PM TECHNIQUE: CTA images of the abdomen and pelvis with 75 mL of iohexol  (OMNIPAQUE ) 350 MG/ML intravenous contrast. Three-dimensional MIP/volume rendered formations were performed. Automated exposure control, iterative reconstruction, and/or weight based adjustment of the mA/kV was utilized to reduce the radiation dose to as low as reasonably achievable. COMPARISON: Renal stone protocol 05/10/2021 and CT abdomen and pelvis with contrast 04/27/2021. CLINICAL HISTORY: Rule out GI bleed. Chief complaints: Hypotension. FINDINGS: VASCULATURE: GI BLEED: No active extravasation of contrast within the GI tract. AORTA: Atherosclerotic calcifications are present in the abdominal aorta. No aneurysm is present. No dissection. CELIAC TRUNK: Calcifications at the origin of the celiac artery without significant stenosis. SUPERIOR MESENTERIC ARTERY: Calcifications at the origin of the SMA result in less  than 50% stenosis relative to the more distal vessel. Diffuse distal calcifications are present. INFERIOR MESENTERIC ARTERY: Atherosclerotic calcifications are present in the IMA. The origin is unremarkable. RENAL ARTERIES: Calcifications are present at the origin of the renal arteries, left greater than right. Calcifications extend into the arteries bilaterally without focal stenosis or aneurysm. ILIAC ARTERIES: No acute finding. No occlusion or significant stenosis. ABDOMEN/PELVIS: LOWER CHEST: The heart is mildly enlarged. Stable right effusion and associated atelectasis is present. Mild-dependent atelectasis is present bilaterally. LIVER: The liver is unremarkable. GALLBLADDER AND BILE DUCTS: Gallbladder is unremarkable. No biliary ductal dilatation. SPLEEN: The spleen is unremarkable. PANCREAS: The pancreas is unremarkable. ADRENAL GLANDS: Bilateral adrenal glands demonstrate no acute abnormality. KIDNEYS, URETERS AND BLADDER: Renal atrophy is present bilaterally. No discrete lesions are present. No stones in the kidneys or ureters. No hydronephrosis. No perinephric or periureteral stranding. Urinary bladder is unremarkable. GI AND BOWEL: Stomach and duodenal sweep demonstrate no acute abnormality. There is no bowel obstruction. No abnormal bowel wall thickening or distension. REPRODUCTIVE: Reproductive organs are unremarkable. PERITONEUM AND RETROPERITONEUM: No ascites or free air. LYMPH NODES: No lymphadenopathy. BONES AND SOFT TISSUES: No acute abnormality of the bones. No acute soft tissue abnormality. IMPRESSION: 1. No active GI bleeding. 2. Atherosclerotic disease with less than 50% stenosis at the SMA origin; no aneurysm or hemodynamically significant stenosis elsewhere. Electronically signed by: Lonni Necessary MD 12/04/2023 12:39 PM EDT RP Workstation: [id number]   DG Chest Port 1 View Result Date: 12/03/2023 CLINICAL DATA:  Weakness EXAM: PORTABLE CHEST 1 VIEW COMPARISON:  Chest radiograph  February 12, 2023. FINDINGS: Cardiomediastinal silhouette is enlarged and increased in size to prior. Aortic knob is calcified. Bilateral vascular congestion and prominent right hilum. Blunting of left costophrenic angle suggestive of small effusion/atelectasis. No acute osseous abnormality. IMPRESSION: Cardiomegaly and pulmonary congestion, progressed to prior. New left small pleural effusion is suspected. Electronically Signed   By: Megan  Zare M.D.   On: 12/03/2023 18:50           LOS: 2 days   Time spent= 35 mins    Burgess JAYSON Dare, MD [facility]  If 7PM-7AM, please contact night-coverage  12/05/2023, 11:02 AM

## 2023-12-05 NOTE — Plan of Care (Signed)

## 2023-12-05 NOTE — Progress Notes (Signed)
 Progress Note  Patient Name: Bailey [name] Date of Encounter: 12/05/2023  Primary Cardiologist:   None   Subjective   She denies any chest pain or SOB.     Inpatient Medications    Scheduled Meds:  sodium chloride    Intravenous Once   aspirin  EC  81 mg Oral Daily   atorvastatin   40 mg Oral Daily   carvedilol   3.125 mg Oral BID WC   Chlorhexidine  Gluconate Cloth  6 each Topical Q0600   donepezil   5 mg Oral QPM   pantoprazole (PROTONIX) IV  40 mg Intravenous Q12H   Continuous Infusions:  cefTRIAXone  (ROCEPHIN )  IV 2 g (12/05/23 0334)   PRN Meds: acetaminophen  **OR** acetaminophen , glucagon (human recombinant), guaiFENesin, haloperidol lactate, ondansetron  **OR** ondansetron  (ZOFRAN ) IV, senna-docusate   Vital Signs    Vitals:   12/04/23 1800 12/04/23 1900 12/04/23 1919 12/05/23 0441  BP: (!) 109/58  (!) 112/57 99/69  Pulse: 65  64 61  Resp:   18 20  Temp: 97.6 F (36.4 C)  97.6 F (36.4 C) 97.6 F (36.4 C)  TempSrc:   Oral Oral  SpO2: 95%  100% 98%  Weight:  49 kg    Height:  5' 4 (1.626 m)      Intake/Output Summary (Last 24 hours) at 12/05/2023 0823 Last data filed at 12/05/2023 0600 Gross per 24 hour  Intake 821.41 ml  Output 2000 ml  Net -1178.59 ml   Filed Weights   12/04/23 1900  Weight: 49 kg    Telemetry    NSR - Personally Reviewed  ECG    NA    - Personally Reviewed  Physical Exam   GEN: No  acute distress.   Neck: No  JVD Cardiac: RRR, pansystolic murmur, no diastolic murmurs, rubs, or gallops.  Respiratory: Clear   to auscultation bilaterally. GI: Soft, nontender, non-distended, normal bowel sounds  MS:  No edema; No deformity. Neuro:   Nonfocal  Psych: Oriented and appropriate    Labs    Chemistry Recent Labs  Lab 12/03/23 1626 12/04/23 0120  NA 136 138  K 3.5 3.7  CL 95* 95*  CO2 27 25  GLUCOSE 100* 105*  BUN 23 27*  CREATININE 2.97* 3.39*  CALCIUM  8.2* 8.1*  PROT 6.7  --   ALBUMIN 3.2*  --   AST 43*   --   ALT 136*  --   ALKPHOS 76  --   BILITOT 1.3*  --   GFRNONAA 16* 14*  ANIONGAP 14 18*     Hematology Recent Labs  Lab 12/04/23 0811 12/04/23 2129 12/05/23 0447  WBC 9.6 9.5 8.1  RBC 3.74* 3.77* 3.60*  HGB 11.3* 11.4* 11.0*  HCT 35.3* 36.0 34.1*  MCV 94.4 95.5 94.7  MCH 30.2 30.2 30.6  MCHC 32.0 31.7 32.3  RDW 20.7* 21.1* 20.7*  PLT 117* 110* 101*    Cardiac EnzymesNo results for input(s): TROPONINI in the last 168 hours. No results for input(s): TROPIPOC in the last 168 hours.   BNPNo results for input(s): BNP, PROBNP in the last 168 hours.   DDimer No results for input(s): DDIMER in the last 168 hours.   Radiology    CT ANGIO GI BLEED Result Date: 12/04/2023 EXAM: CTA ABDOMEN AND PELVIS WITH CONTRAST 12/04/2023 12:06:54 PM TECHNIQUE: CTA images of the abdomen and pelvis with 75 mL of iohexol  (OMNIPAQUE ) 350 MG/ML intravenous contrast. Three-dimensional MIP/volume rendered formations were performed. Automated exposure control, iterative reconstruction, and/or weight based  adjustment of the mA/kV was utilized to reduce the radiation dose to as low as reasonably achievable. COMPARISON: Renal stone protocol 05/10/2021 and CT abdomen and pelvis with contrast 04/27/2021. CLINICAL HISTORY: Rule out GI bleed. Chief complaints: Hypotension. FINDINGS: VASCULATURE: GI BLEED: No active extravasation of contrast within the GI tract. AORTA: Atherosclerotic calcifications are present in the abdominal aorta. No aneurysm is present. No dissection. CELIAC TRUNK: Calcifications at the origin of the celiac artery without significant stenosis. SUPERIOR MESENTERIC ARTERY: Calcifications at the origin of the SMA result in less than 50% stenosis relative to the more distal vessel. Diffuse distal calcifications are present. INFERIOR MESENTERIC ARTERY: Atherosclerotic calcifications are present in the IMA. The origin is unremarkable. RENAL ARTERIES: Calcifications are present at the origin of  the renal arteries, left greater than right. Calcifications extend into the arteries bilaterally without focal stenosis or aneurysm. ILIAC ARTERIES: No acute finding. No occlusion or significant stenosis. ABDOMEN/PELVIS: LOWER CHEST: The heart is mildly enlarged. Stable right effusion and associated atelectasis is present. Mild-dependent atelectasis is present bilaterally. LIVER: The liver is unremarkable. GALLBLADDER AND BILE DUCTS: Gallbladder is unremarkable. No biliary ductal dilatation. SPLEEN: The spleen is unremarkable. PANCREAS: The pancreas is unremarkable. ADRENAL GLANDS: Bilateral adrenal glands demonstrate no acute abnormality. KIDNEYS, URETERS AND BLADDER: Renal atrophy is present bilaterally. No discrete lesions are present. No stones in the kidneys or ureters. No hydronephrosis. No perinephric or periureteral stranding. Urinary bladder is unremarkable. GI AND BOWEL: Stomach and duodenal sweep demonstrate no acute abnormality. There is no bowel obstruction. No abnormal bowel wall thickening or distension. REPRODUCTIVE: Reproductive organs are unremarkable. PERITONEUM AND RETROPERITONEUM: No ascites or free air. LYMPH NODES: No lymphadenopathy. BONES AND SOFT TISSUES: No acute abnormality of the bones. No acute soft tissue abnormality. IMPRESSION: 1. No active GI bleeding. 2. Atherosclerotic disease with less than 50% stenosis at the SMA origin; no aneurysm or hemodynamically significant stenosis elsewhere. Electronically signed by: Lonni Necessary MD 12/04/2023 12:39 PM EDT RP Workstation: [id number]   DG Chest Port 1 View Result Date: 12/03/2023 CLINICAL DATA:  Weakness EXAM: PORTABLE CHEST 1 VIEW COMPARISON:  Chest radiograph February 12, 2023. FINDINGS: Cardiomediastinal silhouette is enlarged and increased in size to prior. Aortic knob is calcified. Bilateral vascular congestion and prominent right hilum. Blunting of left costophrenic angle suggestive of small effusion/atelectasis. No acute  osseous abnormality. IMPRESSION: Cardiomegaly and pulmonary congestion, progressed to prior. New left small pleural effusion is suspected. Electronically Signed   By: Megan  Zare M.D.   On: 12/03/2023 18:50    Cardiac Studies   Echo pending.   Patient Profile     73 y.o. female with  PAD completed by foot ulcers and amputation, ESRD MWF,  DM. HTN .  She was brought to the ED with hypotension in dialysis.  She is noted to have an elevated trop.   Assessment & Plan    Elevated troponin:  On ASA.  Heparin  discontinued.  Echo pending.   Trop elevated and flat.  She has a new anemia.  Not an interventional candidate given significant drop in Hgb.   Anemia:   Good response to transfusion.  CT without active bleeding.  Continue ASA and follow.  Guaiac negative.    Hypertension:  BP was low.  OK to continue beta blocker for SBP greater than 100.    ESRD   For questions or updates, please contact [facility] Please consult [email or web address] for contact info under Cardiology/STEMI.   Signed, Lynwood Schilling, MD  12/05/2023, 8:23 AM

## 2023-12-06 ENCOUNTER — Other Ambulatory Visit (HOSPITAL_COMMUNITY): Payer: Self-pay

## 2023-12-06 DIAGNOSIS — I350 Nonrheumatic aortic (valve) stenosis: Secondary | ICD-10-CM

## 2023-12-06 DIAGNOSIS — N186 End stage renal disease: Secondary | ICD-10-CM | POA: Diagnosis not present

## 2023-12-06 DIAGNOSIS — D649 Anemia, unspecified: Secondary | ICD-10-CM | POA: Diagnosis not present

## 2023-12-06 DIAGNOSIS — R7989 Other specified abnormal findings of blood chemistry: Secondary | ICD-10-CM | POA: Diagnosis not present

## 2023-12-06 LAB — MAGNESIUM: Magnesium: 2.2 mg/dL (ref 1.7–2.4)

## 2023-12-06 LAB — CBC
HCT: 33.3 % — ABNORMAL LOW (ref 36.0–46.0)
Hemoglobin: 10.7 g/dL — ABNORMAL LOW (ref 12.0–15.0)
MCH: 30.5 pg (ref 26.0–34.0)
MCHC: 32.1 g/dL (ref 30.0–36.0)
MCV: 94.9 fL (ref 80.0–100.0)
Platelets: 108 K/uL — ABNORMAL LOW (ref 150–400)
RBC: 3.51 MIL/uL — ABNORMAL LOW (ref 3.87–5.11)
RDW: 19.9 % — ABNORMAL HIGH (ref 11.5–15.5)
WBC: 7.4 K/uL (ref 4.0–10.5)
nRBC: 0 % (ref 0.0–0.2)

## 2023-12-06 LAB — BASIC METABOLIC PANEL WITH GFR
Anion gap: 15 (ref 5–15)
BUN: 35 mg/dL — ABNORMAL HIGH (ref 8–23)
CO2: 26 mmol/L (ref 22–32)
Calcium: 8.5 mg/dL — ABNORMAL LOW (ref 8.9–10.3)
Chloride: 93 mmol/L — ABNORMAL LOW (ref 98–111)
Creatinine, Ser: 4.61 mg/dL — ABNORMAL HIGH (ref 0.44–1.00)
GFR, Estimated: 9 mL/min — ABNORMAL LOW (ref >60)
Glucose, Bld: 150 mg/dL — ABNORMAL HIGH (ref 70–99)
Potassium: 4.2 mmol/L (ref 3.5–5.1)
Sodium: 134 mmol/L — ABNORMAL LOW (ref 135–145)

## 2023-12-06 NOTE — Progress Notes (Addendum)
 Progress Note  Patient Name: Bailey [name] Date of Encounter: 12/06/2023 [facility] Cardiologist: Madonna Large, DO   Interval Summary   Patient reported no issues. Denied syncope, shortness of breath, chest pain, abdominal discomfort, and peripheral edema.  Does not wear oxygen at home, was on [place] during interview while receiving HD  Vital Signs Vitals:   12/06/23 0900 12/06/23 0930 12/06/23 1000 12/06/23 1030  BP: (!) 111/51 (!) 106/52 (!) 113/49 (!) 107/50  Pulse: (!) 56 (!) 56 (!) 56 (!) 55  Resp: 19 18 16 18   Temp:      TempSrc:      SpO2: 96% 99% 96% 97%  Weight:      Height:        Intake/Output Summary (Last 24 hours) at 12/06/2023 1039 Last data filed at 12/06/2023 0226 Gross per 24 hour  Intake --  Output 0 ml  Net 0 ml      12/06/2023    8:26 AM 12/04/2023    7:00 PM 02/12/2023    4:47 PM  Last 3 Weights  Weight (lbs) 110 lb 14.3 oz 108 lb 0.4 oz 110 lb 3.7 oz  Weight (kg) 50.3 kg 49 kg 50 kg      Telemetry/ECG  Sinus bradycardia HR 57 - Personally Reviewed  Physical Exam  GEN: No acute distress.   Neck: unable to assess JVD in HD room Cardiac: RRR, harsh systolic murmur heard best at sternal border 4-5/6.  Respiratory: Clear to auscultation bilaterally. GI: Soft, nontender, non-distended  MS: No edema  Assessment & Plan  Bailey [name] is a 73 y.o. female with hx of hypertension, T2DM, ESRD on HD MWF, PAD c/b foot ulcers and amputation, dementia and hx of CVA who presented to the ED on 10/10 has she had hypotension that hindered her ability to complete HD. She was asymptomatic. In the ED BP: 109/50. She was noted to have mildly elevated LFTS and T bili. Troponin 441-> 424 -> 462. ECG showed new ST changes in V5-V6. Cardiology consulted.   NSTEMI [Type 2 suspected] Acute on chronic anemia Suspected to be demand ischemia in the setting of hypotension in a patient on HD that was terminated early. She was initially started on IV heparin   given possible concern for NSTEMI Type I, this has now been discontinued. On admission her hgb was noted to be down trending [hgb reached 6.3] with thrombocytopenia; she has now received 2 units of PRBC with good response. Work-up as been negative thus far for active bleed. Patient denied chest pain.  Echo showed LVEF 55% with no RWMA. Moderate concentric LVH. Severely dilated LA. See valve disease below.  Will hold off on ischemia evaluation as I do not suspect coronary etiology, additionally patient would not be a cardiac cath candidate with her acute on chronic anemia and thrombocytopenia.  Continue ASA 81 mg as able with current hematologic findings listed above.  Severe aortic stenosis Moderate mitral regurgitation Could be contributing to hypotension seen this admission. Could consider TAVR in the future though would not pursue work-up  this admission with acute on chronic anemia requiring blood transfusions.  Will need to be careful with volume management with her AS.  D/w structural heart team, will plan outpatient f/u  Hx of hypertension Hypotension BP: 113/49 PTA medications have been held at times 2/2 hypotension, this includes amlodipine  and coreg  Suspected to be 2/2 UTI. Patient is on dialysis, will need to closely monitor outpatient. Once UTI resolved, consider re-initiation of  PTA medications in outpatient setting.  PAD s/p great toe amputation Mild bilateral carotid artery stenosis.  Hx of CVA ASA as above.   Hyperlipidemia Continue Lipitor 40 mg  Per primary UTI Acute on chronic anemia Thrombocytopenia Dementia ESRD on HD T2DM Hx of CVA    For questions or updates, please contact [facility] Please consult [email or web address] for contact info under       Signed, Leontine LOISE Salen, PA-C    Patient seen and examined.  Agree with above documentation.  On exam, patient is alert and oriented x2 (did not know year), regular rate and rhythm, 3/6 systolic  murmur, lungs CTAB, no LE edema or JVD.  Echo appears near critical aortic stenosis.  Unfortunately not a good candidate for TAVR at this time given unexplained anemia.  Does appear has responded well to transfusion and no clear source of bleeding.  Will plan outpatient follow-up in structural heart clinic, can consider TAVR evaluation if hemoglobin stable, though with her comorbidities may not be a good candidate for TAVR  Lonni LITTIE Nanas, MD

## 2023-12-06 NOTE — Evaluation (Addendum)
 Occupational Therapy Evaluation Patient Details Name: Bailey [name] MRN: [id number] DOB: 05-Mar-1950 Today's Date: 12/06/2023   History of Present Illness   73 year old female  presented to the hospital from dialysis center due to low blood pressure.  Unable to complete full session.  Upon arrival hemodynamically stable.  She was found to have UTI and slight elevation in troponins, suspect demand ischemia. Past medical history of CKD stage V/ESRD, DDD, DM 2, HTN, HLD.     Clinical Impressions PTA, pt lives with spouse though noted plans to move in with son and daughter in law upon DC. Pt typically ambulatory with rollator and able to manage ADLs w/ supervision. Pt presents now fairly close to this reported baseline though limited in OOB activity d/t dizziness post HD. Overall, pt requires Setup for UB ADL and CGA-Min A for LB ADLs. Pt requires CGA for transfers using RW. Anticipate good progression towards OT goals while admitted to DC home w/ HHOT follow up. Recommend consistent supervision for daily routine given cognitive deficits.     If plan is discharge home, recommend the following:   A little help with walking and/or transfers;A little help with bathing/dressing/bathroom;Direct supervision/assist for medications management;Direct supervision/assist for financial management;Supervision due to cognitive status     Functional Status Assessment   Patient has had a recent decline in their functional status and demonstrates the ability to make significant improvements in function in a reasonable and predictable amount of time.     Equipment Recommendations   None recommended by OT     Recommendations for Other Services         Precautions/Restrictions   Precautions Precautions: Fall Recall of Precautions/Restrictions: Impaired Precaution/Restrictions Comments: monitor dizziness Restrictions Weight Bearing Restrictions Per Provider Order: No     Mobility Bed  Mobility Overal bed mobility: Modified Independent Bed Mobility: Supine to Sit, Sit to Supine                Transfers Overall transfer level: Needs assistance Equipment used: Rolling walker (2 wheels) Transfers: Sit to/from Stand Sit to Stand: Contact guard assist, Min assist           General transfer comment: initially Min A but able to progress to CGA on second trial. Limited by dizziness in standing today      Balance Overall balance assessment: Mild deficits observed, not formally tested                                         ADL either performed or assessed with clinical judgement   ADL Overall ADL's : Needs assistance/impaired Eating/Feeding: Independent   Grooming: Set up;Sitting   Upper Body Bathing: Set up;Sitting   Lower Body Bathing: Contact guard assist;Sitting/lateral leans;Sit to/from stand   Upper Body Dressing : Set up;Sitting   Lower Body Dressing: Minimal assistance;Sitting/lateral leans;Sit to/from stand   Toilet Transfer: Contact guard assist;Stand-pivot;BSC/3in1;Rolling walker (2 wheels)   Toileting- Clothing Manipulation and Hygiene: Contact guard assist;Sitting/lateral lean;Sit to/from stand               Vision Ability to See in Adequate Light: 0 Adequate Patient Visual Report: No change from baseline Vision Assessment?: No apparent visual deficits     Perception         Praxis         Pertinent Vitals/Pain Pain Assessment Pain Assessment: No/denies pain     Extremity/Trunk Assessment  Upper Extremity Assessment Upper Extremity Assessment: Overall WFL for tasks assessed;Right hand dominant   Lower Extremity Assessment Lower Extremity Assessment: Defer to PT evaluation   Cervical / Trunk Assessment Cervical / Trunk Assessment: Normal   Communication Communication Communication: No apparent difficulties   Cognition Arousal: Alert Behavior During Therapy: WFL for tasks  assessed/performed Cognition: Cognition impaired   Orientation impairments: Situation Awareness: Intellectual awareness intact, Online awareness impaired Memory impairment (select all impairments): Short-term memory, Working [employer] fast streamer, Conservation officer, historic buildings Attention impairment (select first level of impairment): Sustained attention Executive functioning impairment (select all impairments): Sequencing, Reasoning, Organization, Problem solving OT - Cognition Comments: aware it is October, repetitive questions/statements at times. poor memory as pt could not recall initial education on reason for SCDs, etc. fair insight into safety concerns/dizziness                 Following commands: Intact       Cueing  General Comments   Cueing Techniques: Verbal cues;Tactile cues      Exercises     Shoulder Instructions      Home Living Family/patient expects to be discharged to:: Private residence Living Arrangements: Children;Spouse/significant other Available Help at Discharge: Family;Available 24 hours/day Type of Home: House Home Access: Stairs to enter [employer] of Steps: 1   Home Layout: Two level     Bathroom Shower/Tub: Producer, television/film/video: Standard     Home Equipment: Educational psychologist (4 wheels);Toilet riser   Additional Comments: Plans to move in with son and daughter in law.      Prior Functioning/Environment Prior Level of Function : Needs assist             Mobility Comments: Per family pt mostly sedentary however uses Rollator for household distances ADLs Comments: Supervision and cues from family but overall able to manage ADLs    OT Problem List: Decreased activity tolerance;Impaired balance (sitting and/or standing);Decreased cognition;Decreased knowledge of use of DME or AE   OT Treatment/Interventions: Self-care/ADL training;Therapeutic exercise;Energy conservation;DME and/or AE instruction;Therapeutic  activities;Patient/family education;Balance training      OT Goals(Current goals can be found in the care plan section)   Acute Rehab OT Goals Patient Stated Goal: walk more when dizziness subsides OT Goal Formulation: With patient Time For Goal Achievement: 12/20/23 Potential to Achieve Goals: Good ADL Goals Pt Will Perform Grooming: standing;with set-up Pt Will Perform Lower Body Dressing: with set-up;sit to/from stand;sitting/lateral leans Pt Will Transfer to Toilet: with supervision;ambulating Additional ADL Goal #1: Pt to verbalize at least 3 fall prevention precautions to implement at home if dizziness felt   OT Frequency:  Min 2X/week    Co-evaluation              AM-PAC OT 6 Clicks Daily Activity     Outcome Measure Help from another person eating meals?: None Help from another person taking care of personal grooming?: A Little Help from another person toileting, which includes using toliet, bedpan, or urinal?: A Little Help from another person bathing (including washing, rinsing, drying)?: A Little Help from another person to put on and taking off regular upper body clothing?: A Little Help from another person to put on and taking off regular lower body clothing?: A Little 6 Click Score: 19   End of Session Equipment Utilized During Treatment: Rolling walker (2 wheels);Gait belt Nurse Communication: Mobility status;Other (comment) (son called during session; reports need to relay message to change HD centers to [facility])  Activity Tolerance: Treatment limited  secondary to medical complications (Comment) Patient left: in bed;with call bell/phone within reach;with bed alarm set  OT Visit Diagnosis: Unsteadiness on feet (R26.81);Other abnormalities of gait and mobility (R26.89);Muscle weakness (generalized) (M62.81);Other symptoms and signs involving cognitive function                Time: 1342-1405 OT Time Calculation (min): 23 min Charges:  OT General  Charges $OT Visit: 1 Visit OT Evaluation $OT Eval Low Complexity: 1 Low  Mliss NOVAK, OTR/L Acute Rehab Services Office: [phone]   Mliss Fish 12/06/2023, 2:24 PM

## 2023-12-06 NOTE — Progress Notes (Signed)
 [facility] Progress Note   Subjective:    Seen and examined patient on HD. Tolerating UFG 1.5L. BP is 109/51. She denies SOB, CP, or N/V.  Objective Vitals:   12/06/23 0930 12/06/23 1000 12/06/23 1030 12/06/23 1059  BP: (!) 106/52 (!) 113/49 (!) 107/50 (!) 108/47  Pulse: (!) 56 (!) 56 (!) 55 (!) 55  Resp: 18 16 18 18   Temp:      TempSrc:      SpO2: 99% 96% 97% 100%  Weight:      Height:       Physical Exam General: Well appearing, NAD. [place] O2 in place. Baseline severe dementia Heart: RRR, 2/6 murmur Lungs: CTAB Abdomen: soft Extremities: no LE edema Dialysis Access: LUE AVF +t/b  Filed Weights   12/04/23 1900 12/06/23 0826  Weight: 49 kg 50.3 kg    Intake/Output Summary (Last 24 hours) at 12/06/2023 1121 Last data filed at 12/06/2023 0226 Gross per 24 hour  Intake --  Output 0 ml  Net 0 ml    Additional Objective Labs: Basic Metabolic Panel: Recent Labs  Lab 12/04/23 0120 12/05/23 0834 12/06/23 0446  NA 138 138 134*  K 3.7 4.4 4.2  CL 95* 94* 93*  CO2 25 24 26   GLUCOSE 105* 119* 150*  BUN 27* 26* 35*  CREATININE 3.39* 3.48* 4.61*  CALCIUM  8.1* 8.6* 8.5*  PHOS  --  5.4*  --    Liver Function Tests: Recent Labs  Lab 12/03/23 1626  AST 43*  ALT 136*  ALKPHOS 76  BILITOT 1.3*  PROT 6.7  ALBUMIN 3.2*   No results for input(s): LIPASE, AMYLASE in the last 168 hours. CBC: Recent Labs  Lab 12/04/23 0120 12/04/23 0811 12/04/23 2129 12/05/23 0447 12/06/23 0446  WBC 11.6* 9.6 9.5 8.1 7.4  HGB 6.3* 11.3* 11.4* 11.0* 10.7*  HCT 20.7* 35.3* 36.0 34.1* 33.3*  MCV 103.0* 94.4 95.5 94.7 94.9  PLT 131* 117* 110* 101* 108*   Blood Culture    Component Value Date/Time   SDES URINE, CLEAN CATCH 09/30/2022 1016   SPECREQUEST  09/30/2022 1016    NONE Performed at [facility], [address], [place], [place] [address]    CULT MULTIPLE SPECIES PRESENT, SUGGEST RECOLLECTION (A) 09/30/2022 1016   REPTSTATUS 10/03/2022 FINAL  09/30/2022 1016    Cardiac Enzymes: No results for input(s): CKTOTAL, CKMB, CKMBINDEX, TROPONINI in the last 168 hours. CBG: Recent Labs  Lab 12/03/23 1745 12/04/23 1834  GLUCAP 84 113*   Iron  Studies:  Recent Labs    12/03/23 1817  IRON  37  TIBC 183*  FERRITIN 1,317*   Lab Results  Component Value Date   INR 1.1 12/03/2023   INR 1.1 09/30/2022   INR 1.2 07/11/2022   Studies/Results: ECHOCARDIOGRAM COMPLETE Result Date: 12/05/2023    ECHOCARDIOGRAM REPORT   Patient Name:   Bailey [name] Date of Exam: 12/05/2023 Medical Rec #:  [id number]      Height:       64.0 in Accession #:    [id number]     Weight:       108.0 lb Date of Birth:  12/23/1950      BSA:          1.506 m Patient Age:    73 years       BP:           115/54 mmHg Patient Gender: F  HR:           56 bpm. Exam Location:  Inpatient Procedure: 2D Echo, Cardiac Doppler and Color Doppler (Both Spectral and Color            Flow Doppler were utilized during procedure). Indications:    Cardiomyopathy Uncpecified I42.9  History:        Patient has prior history of Echocardiogram examinations, most                 recent 07/12/2022.  Sonographer:    Tinnie Gosling RDCS Referring Phys: [id number] ROLLO JONELLE LOUDER IMPRESSIONS  1. Left ventricular ejection fraction, by estimation, is 55%. The left ventricle has normal function. The left ventricle has no regional wall motion abnormalities. There is moderate concentric left ventricular hypertrophy. Left ventricular diastolic parameters are indeterminate.  2. Right ventricular systolic function is normal. The right ventricular size is normal. Tricuspid regurgitation signal is inadequate for assessing PA pressure.  3. Left atrial size was severely dilated.  4. The mitral valve is degenerative. Moderate mitral valve regurgitation.  5. The aortic valve is tricuspid. There is moderate calcification of the aortic valve. Aortic valve regurgitation is trivial. Severe aortic valve  stenosis. Aortic valve mean gradient measures 47.0 mmHg. Dimentionless index 0.14.  6. The inferior vena cava is dilated in size with <50% respiratory variability, suggesting right atrial pressure of 15 mmHg. Comparison(s): Prior images reviewed side by side. LVEF approximately 55%. Aortic stenosis has progressed, severe with mean AV gradient 47 mmHg and DI 0.14. FINDINGS  Left Ventricle: Left ventricular ejection fraction, by estimation, is 55%. The left ventricle has normal function. The left ventricle has no regional wall motion abnormalities. The left ventricular internal cavity size was normal in size. There is moderate concentric left ventricular hypertrophy. Left ventricular diastolic function could not be evaluated due to mitral annular calcification (moderate or greater). Left ventricular diastolic parameters are indeterminate. Right Ventricle: The right ventricular size is normal. No increase in right ventricular wall thickness. Right ventricular systolic function is normal. Tricuspid regurgitation signal is inadequate for assessing PA pressure. Left Atrium: Left atrial size was severely dilated. Right Atrium: Right atrial size was normal in size. Pericardium: Trivial pericardial effusion is present. The pericardial effusion is anterior to the right ventricle. Mitral Valve: The mitral valve is degenerative in appearance. Moderate mitral valve regurgitation. MV peak gradient, 13.7 mmHg. The mean mitral valve gradient is 4.0 mmHg. Tricuspid Valve: The tricuspid valve is grossly normal. Tricuspid valve regurgitation is mild. Aortic Valve: The aortic valve is tricuspid. There is moderate calcification of the aortic valve. There is mild to moderate aortic valve annular calcification. Aortic valve regurgitation is trivial. Severe aortic stenosis is present. Aortic valve mean gradient measures 47.0 mmHg. Aortic valve peak gradient measures 75.0 mmHg. Aortic valve area, by VTI measures 0.42 cm. Pulmonic Valve:  The pulmonic valve was grossly normal. Pulmonic valve regurgitation is trivial. Aorta: The aortic root and ascending aorta are structurally normal, with no evidence of dilitation. Venous: The inferior vena cava is dilated in size with less than 50% respiratory variability, suggesting right atrial pressure of 15 mmHg. IAS/Shunts: No atrial level shunt detected by color flow Doppler. Additional Comments: 3D was performed not requiring image post processing on an independent workstation and was indeterminate.  LEFT VENTRICLE PLAX 2D LVIDd:         4.94 cm   Diastology LVIDs:         3.91 cm   LV e' medial:  3.37 cm/s LV PW:         1.38 cm   LV E/e' medial:  51.3 LV IVS:        1.38 cm   LV e' lateral:   3.37 cm/s LVOT diam:     1.94 cm   LV E/e' lateral: 51.3 LV SV:         46 LV SV Index:   30 LVOT Area:     2.96 cm  RIGHT VENTRICLE             IVC RV S prime:     12.90 cm/s  IVC diam: 2.94 cm TAPSE (M-mode): 2.1 cm LEFT ATRIUM              Index        RIGHT ATRIUM           Index LA diam:        4.30 cm  2.86 cm/m   RA Area:     17.10 cm LA Vol (A2C):   128.0 ml 85.01 ml/m  RA Volume:   45.50 ml  30.22 ml/m LA Vol (A4C):   77.6 ml  51.53 ml/m LA Biplane Vol: 103.0 ml 68.40 ml/m  AORTIC VALVE AV Area (Vmax):    0.39 cm AV Area (Vmean):   0.38 cm AV Area (VTI):     0.42 cm AV Vmax:           433.00 cm/s AV Vmean:          326.000 cm/s AV VTI:            1.080 m AV Peak Grad:      75.0 mmHg AV Mean Grad:      47.0 mmHg LVOT Vmax:         56.70 cm/s LVOT Vmean:        41.500 cm/s LVOT VTI:          0.154 m LVOT/AV VTI ratio: 0.14  AORTA Ao Root diam: 2.67 cm Ao Asc diam:  3.66 cm MITRAL VALVE MV Area (PHT): 2.22 cm     SHUNTS MV Area VTI:   0.78 cm     Systemic VTI:  0.15 m MV Peak grad:  13.7 mmHg    Systemic Diam: 1.94 cm MV Mean grad:  4.0 mmHg MV Vmax:       1.85 m/s MV Vmean:      95.6 cm/s MV Decel Time: 341 msec MV E velocity: 173.00 cm/s MV A velocity: 87.30 cm/s MV E/A ratio:  1.98 Jayson Sierras MD Electronically signed by Jayson Sierras MD Signature Date/Time: 12/05/2023/1:41:05 PM    Final    CT ANGIO GI BLEED Result Date: 12/04/2023 EXAM: CTA ABDOMEN AND PELVIS WITH CONTRAST 12/04/2023 12:06:54 PM TECHNIQUE: CTA images of the abdomen and pelvis with 75 mL of iohexol  (OMNIPAQUE ) 350 MG/ML intravenous contrast. Three-dimensional MIP/volume rendered formations were performed. Automated exposure control, iterative reconstruction, and/or weight based adjustment of the mA/kV was utilized to reduce the radiation dose to as low as reasonably achievable. COMPARISON: Renal stone protocol 05/10/2021 and CT abdomen and pelvis with contrast 04/27/2021. CLINICAL HISTORY: Rule out GI bleed. Chief complaints: Hypotension. FINDINGS: VASCULATURE: GI BLEED: No active extravasation of contrast within the GI tract. AORTA: Atherosclerotic calcifications are present in the abdominal aorta. No aneurysm is present. No dissection. CELIAC TRUNK: Calcifications at the origin of the celiac artery without significant stenosis. SUPERIOR MESENTERIC ARTERY: Calcifications at the origin of the SMA result in  less than 50% stenosis relative to the more distal vessel. Diffuse distal calcifications are present. INFERIOR MESENTERIC ARTERY: Atherosclerotic calcifications are present in the IMA. The origin is unremarkable. RENAL ARTERIES: Calcifications are present at the origin of the renal arteries, left greater than right. Calcifications extend into the arteries bilaterally without focal stenosis or aneurysm. ILIAC ARTERIES: No acute finding. No occlusion or significant stenosis. ABDOMEN/PELVIS: LOWER CHEST: The heart is mildly enlarged. Stable right effusion and associated atelectasis is present. Mild-dependent atelectasis is present bilaterally. LIVER: The liver is unremarkable. GALLBLADDER AND BILE DUCTS: Gallbladder is unremarkable. No biliary ductal dilatation. SPLEEN: The spleen is unremarkable. PANCREAS: The pancreas is  unremarkable. ADRENAL GLANDS: Bilateral adrenal glands demonstrate no acute abnormality. KIDNEYS, URETERS AND BLADDER: Renal atrophy is present bilaterally. No discrete lesions are present. No stones in the kidneys or ureters. No hydronephrosis. No perinephric or periureteral stranding. Urinary bladder is unremarkable. GI AND BOWEL: Stomach and duodenal sweep demonstrate no acute abnormality. There is no bowel obstruction. No abnormal bowel wall thickening or distension. REPRODUCTIVE: Reproductive organs are unremarkable. PERITONEUM AND RETROPERITONEUM: No ascites or free air. LYMPH NODES: No lymphadenopathy. BONES AND SOFT TISSUES: No acute abnormality of the bones. No acute soft tissue abnormality. IMPRESSION: 1. No active GI bleeding. 2. Atherosclerotic disease with less than 50% stenosis at the SMA origin; no aneurysm or hemodynamically significant stenosis elsewhere. Electronically signed by: Lonni Necessary MD 12/04/2023 12:39 PM EDT RP Workstation: [id number]    Medications:  cefTRIAXone  (ROCEPHIN )  IV 2 g (12/06/23 0544)    sodium chloride    Intravenous Once   aspirin  EC  81 mg Oral Daily   atorvastatin   40 mg Oral Daily   Chlorhexidine  Gluconate Cloth  6 each Topical Q0600   donepezil   5 mg Oral QPM   pantoprazole  40 mg Oral BID    Dialysis Orders: MWF - AF *Completed 3hr HD on 10/10 - post-HD weight 51.6kg 3:30hr, 350/500, EDW 48.7kg, 3K/2.5Ca bath, LUE AVF, heparin  2000 unit bolus - Mircera 75mcg IV q 2 weeks - last 9/29 - Hectoral 4mcg IV q HD  Assessment/Plan: NSTEMI: Cardiology consulted, echo pending. ?UTI: Getting Ceftriaxone  course. ESRD: Usual MWF schedule. S/p HD yesterday evening for pulm edema, 2L off. On HD. Hypertension/volume: Hypotensive on admit, home meds held. Pulm edema on imaging - better with HD. Anemia of ESRD: S/p 2U PRBCs so far. CT angio negative. Hgb much better today-now 10.7. Metabolic bone disease: Ca ok, Phos pending. T2DM Dementia  Charmaine Piety, NP [facility] 12/06/2023,11:21 AM  LOS: 3 days

## 2023-12-06 NOTE — Progress Notes (Signed)
   12/06/23 1148  Vitals  Temp 97.8 F (36.6 C)  Pulse Rate (!) 57  Resp 19  BP (!) 119/57  SpO2 97 %  O2 Device Nasal Cannula  Weight 48.7 kg  Type of Weight Pre-Dialysis  Oxygen Therapy  O2 Flow Rate (L/min) 1 L/min  Patient Activity (if Appropriate) In bed  Post Treatment  Dialyzer Clearance Lightly streaked  Hemodialysis Intake (mL) 0 mL  Liters Processed 72  Fluid Removed (mL) 1500 mL  Tolerated HD Treatment Yes  AVG/AVF Arterial Site Held (minutes) 10 minutes  AVG/AVF Venous Site Held (minutes) 10 minutes   Received patient in bed to unit.  Alert and oriented. To pewrson---pleasantly confused Informed consent signed and in chart.   TX duration:3.0 per md order today  Patient tolerated well.  Transported back to the room  Alert, without acute distress.  Hand-off given to patient's nurse.   Access used: LUAF Access issues: no complicatiuons  Total UF removed: 1500 Medication(s) given: none   Delon LITTIE [facility]

## 2023-12-06 NOTE — Progress Notes (Signed)
 PROGRESS NOTE    Bailey [name]  FMW:[id number] DOB: [birth year] DOA: 12/03/2023 PCP: Arloa Jarvis, NP    Brief Narrative:   73 year old with history of CKD stage V/ESRD, DDD, DM 2, HTN, HLD presented to the hospital from dialysis center due to low blood pressure.  Unable to complete full session.  Upon arrival hemodynamically stable.  She was found to have urinary tract infection and slight elevation in troponins.  Started on IV Rocephin , nephrology and cardiology were consulted.  She also had drop in hemoglobin without evidence of any bleeding therefore required PRBC transfusion, CTA GI bleed was negative.  Overall she was feeling well in the hospital tolerated IV Rocephin , blood pressure improved.  She was also dialyzed during this hospitalization.  There was a transient drop in hemoglobin work workup including CTA GI bleed was negative.  Case was discussed with GI who recommended outpatient follow-up.  Assessment & Plan:  Hypotension Urinary tract infection - I wonder this initial hypotension was secondary to placing her on dialysis in the setting of severe aortic stenosis.  Certainly could have been UTI but this has not been treated with 3 days of IV Rocephin  and patient is overall asymptomatic therefore we do not need any further antibiotics for now.  ESRD on hemodialysis - Nephrology consulted.  Status post HD 10/11.  May end up requiring midodrine prior to her hemodialysis sessions.  Elevated troponin/NSTEMI type 2 Severe aortic stenosis - Suspect demand ischemia echocardiogram shows preserved EF with severe aortic stenosis which has now progressed from last year.  I have read discussed with cardiology this morning who will discuss with their structural team regarding how to address his severe AAS  Acute on chronic anemia Thrombocytopenia -No obvious signs of bleeding.  Baseline hemoglobin 10, continues to drop hemoglobin, this morning down to 6.3.  2 units of PRBC transfusion  ordered.  Hemoglobin is now back up to 11.0 and stable - CTA GI bleed is negative for any active bleeding - Discussed with GI, Dr. Leigh.  As long as hemoglobin remained stable, patient can follow-up outpatient but will be available if hemodynamically becomes unstable.  Bone marrow biopsy 2020 showed hypercellular bone marrow with trilineage marrow process  Essential hypertension - For now hold off on antihypertensives as blood pressure is on the softer side  Diabetes mellitus type 2 - Not on any home medications.  History of PAD Foot ulcers/amputations - Aspirin  and statin  Cognitive impairment - On donepezil   History of CVA Hyperlipidemia - On aspirin  and statin  DVT prophylaxis: SCDs Start: 12/03/23 2242    Code Status: Full Code Family Communication:  Tammy, uptodate.  Status is: Inpatient Remains inpatient appropriate because: Hopefully discharge in next 24 to 48 hours  PT Follow up Recs: Home Health Pt10/01/2024 1500; f89f completed.   Subjective: Seen in HD no complaints Examination:  General exam: Appears calm and comfortable  Respiratory system: Clear to auscultation. Respiratory effort normal. Cardiovascular system: S1 & S2 heard, RRR. No JVD, murmurs, rubs, gallops or clicks. No pedal edema. Gastrointestinal system: Abdomen is nondistended, soft and nontender. No organomegaly or masses felt. Normal bowel sounds heard. Central nervous system: Alert and oriented. No focal neurological deficits. Extremities: Symmetric 5 x 5 power. Skin: No rashes, lesions or ulcers Psychiatry: Judgement and insight appear normal. Mood & affect appropriate.                Diet Orders (From admission, onward)     Start  Ordered   12/03/23 2243  Diet regular Room service appropriate? Yes; Fluid consistency: Thin  Diet effective now       Question Answer Comment  Room service appropriate? Yes   Fluid consistency: Thin      12/03/23 2242             Objective: Vitals:   12/06/23 0930 12/06/23 1000 12/06/23 1030 12/06/23 1059  BP: (!) 106/52 (!) 113/49 (!) 107/50 (!) 108/47  Pulse: (!) 56 (!) 56 (!) 55 (!) 55  Resp: 18 16 18 18   Temp:      TempSrc:      SpO2: 99% 96% 97% 100%  Weight:      Height:        Intake/Output Summary (Last 24 hours) at 12/06/2023 1111 Last data filed at 12/06/2023 0226 Gross per 24 hour  Intake --  Output 0 ml  Net 0 ml   Filed Weights   12/04/23 1900 12/06/23 0826  Weight: 49 kg 50.3 kg    Scheduled Meds:  sodium chloride    Intravenous Once   aspirin  EC  81 mg Oral Daily   atorvastatin   40 mg Oral Daily   Chlorhexidine  Gluconate Cloth  6 each Topical Q0600   donepezil   5 mg Oral QPM   pantoprazole  40 mg Oral BID   Continuous Infusions:  cefTRIAXone  (ROCEPHIN )  IV 2 g (12/06/23 0544)    Nutritional status     Body mass index is 19.03 kg/m.  Data Reviewed:   CBC: Recent Labs  Lab 12/04/23 0120 12/04/23 0811 12/04/23 2129 12/05/23 0447 12/06/23 0446  WBC 11.6* 9.6 9.5 8.1 7.4  HGB 6.3* 11.3* 11.4* 11.0* 10.7*  HCT 20.7* 35.3* 36.0 34.1* 33.3*  MCV 103.0* 94.4 95.5 94.7 94.9  PLT 131* 117* 110* 101* 108*   Basic Metabolic Panel: Recent Labs  Lab 12/03/23 1626 12/04/23 0120 12/05/23 0834 12/06/23 0446  NA 136 138 138 134*  K 3.5 3.7 4.4 4.2  CL 95* 95* 94* 93*  CO2 27 25 24 26   GLUCOSE 100* 105* 119* 150*  BUN 23 27* 26* 35*  CREATININE 2.97* 3.39* 3.48* 4.61*  CALCIUM  8.2* 8.1* 8.6* 8.5*  MG  --   --  2.0 2.2  PHOS  --   --  5.4*  --    GFR: Estimated Creatinine Clearance: 8.4 mL/min (A) (by C-G formula based on SCr of 4.61 mg/dL (H)). Liver Function Tests: Recent Labs  Lab 12/03/23 1626  AST 43*  ALT 136*  ALKPHOS 76  BILITOT 1.3*  PROT 6.7  ALBUMIN 3.2*   No results for input(s): LIPASE, AMYLASE in the last 168 hours. No results for input(s): AMMONIA in the last 168 hours. Coagulation Profile: Recent Labs  Lab 12/03/23 1626  INR  1.1   Cardiac Enzymes: No results for input(s): CKTOTAL, CKMB, CKMBINDEX, TROPONINI in the last 168 hours. BNP (last 3 results) No results for input(s): PROBNP in the last 8760 hours. HbA1C: No results for input(s): HGBA1C in the last 72 hours. CBG: Recent Labs  Lab 12/03/23 1745 12/04/23 1834  GLUCAP 84 113*   Lipid Profile: No results for input(s): CHOL, HDL, LDLCALC, TRIG, CHOLHDL, LDLDIRECT in the last 72 hours. Thyroid  Function Tests: No results for input(s): TSH, T4TOTAL, FREET4, T3FREE, THYROIDAB in the last 72 hours. Anemia Panel: Recent Labs    12/03/23 1817  FERRITIN 1,317*  TIBC 183*  IRON  37   Sepsis Labs: No results for input(s): PROCALCITON, LATICACIDVEN in the  last 168 hours.  Recent Results (from the past 240 hours)  MRSA Next Gen by PCR, Nasal     Status: None   Collection Time: 12/05/23  6:29 AM   Specimen: Nasal Mucosa; Nasal Swab  Result Value Ref Range Status   MRSA by PCR Next Gen NOT DETECTED NOT DETECTED Final    Comment: (NOTE) The GeneXpert MRSA Assay (FDA approved for NASAL specimens only), is one component of a comprehensive MRSA colonization surveillance program. It is not intended to diagnose MRSA infection nor to guide or monitor treatment for MRSA infections. Test performance is not FDA approved in patients less than 32 years old. Performed at [facility], [address] [place], [place] [address]          Radiology Studies: ECHOCARDIOGRAM COMPLETE Result Date: 12/05/2023    ECHOCARDIOGRAM REPORT   Patient Name:   NEENAH CANTER [facility] Date of Exam: 12/05/2023 Medical Rec #:  [id number]      Height:       64.0 in Accession #:    [id number]     Weight:       108.0 lb Date of Birth:  05-25-1950      BSA:          1.506 m Patient Age:    73 years       BP:           115/54 mmHg Patient Gender: F              HR:           56 bpm. Exam Location:  Inpatient Procedure: 2D Echo, Cardiac Doppler and  Color Doppler (Both Spectral and Color            Flow Doppler were utilized during procedure). Indications:    Cardiomyopathy Uncpecified I42.9  History:        Patient has prior history of Echocardiogram examinations, most                 recent 07/12/2022.  Sonographer:    Tinnie Gosling RDCS Referring Phys: [id number] ROLLO JONELLE LOUDER IMPRESSIONS  1. Left ventricular ejection fraction, by estimation, is 55%. The left ventricle has normal function. The left ventricle has no regional wall motion abnormalities. There is moderate concentric left ventricular hypertrophy. Left ventricular diastolic parameters are indeterminate.  2. Right ventricular systolic function is normal. The right ventricular size is normal. Tricuspid regurgitation signal is inadequate for assessing PA pressure.  3. Left atrial size was severely dilated.  4. The mitral valve is degenerative. Moderate mitral valve regurgitation.  5. The aortic valve is tricuspid. There is moderate calcification of the aortic valve. Aortic valve regurgitation is trivial. Severe aortic valve stenosis. Aortic valve mean gradient measures 47.0 mmHg. Dimentionless index 0.14.  6. The inferior vena cava is dilated in size with <50% respiratory variability, suggesting right atrial pressure of 15 mmHg. Comparison(s): Prior images reviewed side by side. LVEF approximately 55%. Aortic stenosis has progressed, severe with mean AV gradient 47 mmHg and DI 0.14. FINDINGS  Left Ventricle: Left ventricular ejection fraction, by estimation, is 55%. The left ventricle has normal function. The left ventricle has no regional wall motion abnormalities. The left ventricular internal cavity size was normal in size. There is moderate concentric left ventricular hypertrophy. Left ventricular diastolic function could not be evaluated due to mitral annular calcification (moderate or greater). Left ventricular diastolic parameters are indeterminate. Right Ventricle: The right ventricular size  is normal. No  increase in right ventricular wall thickness. Right ventricular systolic function is normal. Tricuspid regurgitation signal is inadequate for assessing PA pressure. Left Atrium: Left atrial size was severely dilated. Right Atrium: Right atrial size was normal in size. Pericardium: Trivial pericardial effusion is present. The pericardial effusion is anterior to the right ventricle. Mitral Valve: The mitral valve is degenerative in appearance. Moderate mitral valve regurgitation. MV peak gradient, 13.7 mmHg. The mean mitral valve gradient is 4.0 mmHg. Tricuspid Valve: The tricuspid valve is grossly normal. Tricuspid valve regurgitation is mild. Aortic Valve: The aortic valve is tricuspid. There is moderate calcification of the aortic valve. There is mild to moderate aortic valve annular calcification. Aortic valve regurgitation is trivial. Severe aortic stenosis is present. Aortic valve mean gradient measures 47.0 mmHg. Aortic valve peak gradient measures 75.0 mmHg. Aortic valve area, by VTI measures 0.42 cm. Pulmonic Valve: The pulmonic valve was grossly normal. Pulmonic valve regurgitation is trivial. Aorta: The aortic root and ascending aorta are structurally normal, with no evidence of dilitation. Venous: The inferior vena cava is dilated in size with less than 50% respiratory variability, suggesting right atrial pressure of 15 mmHg. IAS/Shunts: No atrial level shunt detected by color flow Doppler. Additional Comments: 3D was performed not requiring image post processing on an independent workstation and was indeterminate.  LEFT VENTRICLE PLAX 2D LVIDd:         4.94 cm   Diastology LVIDs:         3.91 cm   LV e' medial:    3.37 cm/s LV PW:         1.38 cm   LV E/e' medial:  51.3 LV IVS:        1.38 cm   LV e' lateral:   3.37 cm/s LVOT diam:     1.94 cm   LV E/e' lateral: 51.3 LV SV:         46 LV SV Index:   30 LVOT Area:     2.96 cm  RIGHT VENTRICLE             IVC RV S prime:     12.90 cm/s  IVC  diam: 2.94 cm TAPSE (M-mode): 2.1 cm LEFT ATRIUM              Index        RIGHT ATRIUM           Index LA diam:        4.30 cm  2.86 cm/m   RA Area:     17.10 cm LA Vol (A2C):   128.0 ml 85.01 ml/m  RA Volume:   45.50 ml  30.22 ml/m LA Vol (A4C):   77.6 ml  51.53 ml/m LA Biplane Vol: 103.0 ml 68.40 ml/m  AORTIC VALVE AV Area (Vmax):    0.39 cm AV Area (Vmean):   0.38 cm AV Area (VTI):     0.42 cm AV Vmax:           433.00 cm/s AV Vmean:          326.000 cm/s AV VTI:            1.080 m AV Peak Grad:      75.0 mmHg AV Mean Grad:      47.0 mmHg LVOT Vmax:         56.70 cm/s LVOT Vmean:        41.500 cm/s LVOT VTI:          0.154 m LVOT/AV  VTI ratio: 0.14  AORTA Ao Root diam: 2.67 cm Ao Asc diam:  3.66 cm MITRAL VALVE MV Area (PHT): 2.22 cm     SHUNTS MV Area VTI:   0.78 cm     Systemic VTI:  0.15 m MV Peak grad:  13.7 mmHg    Systemic Diam: 1.94 cm MV Mean grad:  4.0 mmHg MV Vmax:       1.85 m/s MV Vmean:      95.6 cm/s MV Decel Time: 341 msec MV E velocity: 173.00 cm/s MV A velocity: 87.30 cm/s MV E/A ratio:  1.98 Jayson Sierras MD Electronically signed by Jayson Sierras MD Signature Date/Time: 12/05/2023/1:41:05 PM    Final    CT ANGIO GI BLEED Result Date: 12/04/2023 EXAM: CTA ABDOMEN AND PELVIS WITH CONTRAST 12/04/2023 12:06:54 PM TECHNIQUE: CTA images of the abdomen and pelvis with 75 mL of iohexol  (OMNIPAQUE ) 350 MG/ML intravenous contrast. Three-dimensional MIP/volume rendered formations were performed. Automated exposure control, iterative reconstruction, and/or weight based adjustment of the mA/kV was utilized to reduce the radiation dose to as low as reasonably achievable. COMPARISON: Renal stone protocol 05/10/2021 and CT abdomen and pelvis with contrast 04/27/2021. CLINICAL HISTORY: Rule out GI bleed. Chief complaints: Hypotension. FINDINGS: VASCULATURE: GI BLEED: No active extravasation of contrast within the GI tract. AORTA: Atherosclerotic calcifications are present in the abdominal  aorta. No aneurysm is present. No dissection. CELIAC TRUNK: Calcifications at the origin of the celiac artery without significant stenosis. SUPERIOR MESENTERIC ARTERY: Calcifications at the origin of the SMA result in less than 50% stenosis relative to the more distal vessel. Diffuse distal calcifications are present. INFERIOR MESENTERIC ARTERY: Atherosclerotic calcifications are present in the IMA. The origin is unremarkable. RENAL ARTERIES: Calcifications are present at the origin of the renal arteries, left greater than right. Calcifications extend into the arteries bilaterally without focal stenosis or aneurysm. ILIAC ARTERIES: No acute finding. No occlusion or significant stenosis. ABDOMEN/PELVIS: LOWER CHEST: The heart is mildly enlarged. Stable right effusion and associated atelectasis is present. Mild-dependent atelectasis is present bilaterally. LIVER: The liver is unremarkable. GALLBLADDER AND BILE DUCTS: Gallbladder is unremarkable. No biliary ductal dilatation. SPLEEN: The spleen is unremarkable. PANCREAS: The pancreas is unremarkable. ADRENAL GLANDS: Bilateral adrenal glands demonstrate no acute abnormality. KIDNEYS, URETERS AND BLADDER: Renal atrophy is present bilaterally. No discrete lesions are present. No stones in the kidneys or ureters. No hydronephrosis. No perinephric or periureteral stranding. Urinary bladder is unremarkable. GI AND BOWEL: Stomach and duodenal sweep demonstrate no acute abnormality. There is no bowel obstruction. No abnormal bowel wall thickening or distension. REPRODUCTIVE: Reproductive organs are unremarkable. PERITONEUM AND RETROPERITONEUM: No ascites or free air. LYMPH NODES: No lymphadenopathy. BONES AND SOFT TISSUES: No acute abnormality of the bones. No acute soft tissue abnormality. IMPRESSION: 1. No active GI bleeding. 2. Atherosclerotic disease with less than 50% stenosis at the SMA origin; no aneurysm or hemodynamically significant stenosis elsewhere. Electronically  signed by: Lonni Necessary MD 12/04/2023 12:39 PM EDT RP Workstation: [id number]           LOS: 3 days   Time spent= 35 mins    Burgess JAYSON Dare, MD [facility]  If 7PM-7AM, please contact night-coverage  12/06/2023, 11:11 AM

## 2023-12-06 NOTE — Progress Notes (Signed)
 OT Cancellation Note  Patient Details Name: Bailey [name] MRN: [id number] DOB: [birth year]   Cancelled Treatment:    Reason Eval/Treat Not Completed: Patient at procedure or test/ unavailable Off unit for HD. Will follow up at a later time for OT eval.  Mliss Fish 12/06/2023, 9:28 AM

## 2023-12-06 NOTE — Progress Notes (Addendum)
 Pt receives out-pt HD at [facility] on MWF 12:05 pm chair time. Will assist as needed.  Randine Mungo Dialysis Navigator [phone]  Addendum at 2:29 pm: Contacted by staff saying that pt will go live with son at d/c and son is requesting a HD clinic closer to son's home. Son is requesting [facility] [place]. Spoke to pt's son via phone. Son provided his address and confirms he wants pt to go to [facility] [place] on MWF 2nd shift if possible. Spoke to staff at [facility] who report they have requested a transfer to [facility] [place] on MWF. Current clinic to contact [place] clinic to see if pt can be accepted and start on Wed or Fri of this week. Will await determination from [facility] regarding transfer request. Update provided to team via secure chat.

## 2023-12-06 NOTE — Progress Notes (Signed)
 0730: This nurse receives report from [place], Charity fundraiser at this time. Possible d/c today.  0800: Patient off of floor at this time, for HD. Per [place], Unit Hall  1158: Report from [facility]:  3 Hour run time, 1500 cc off, Sinus Brady on telemetry, 97% on 1L (no SOB), 119/57, patient resting, placed in transport  Patient returns to floor, vitals taken by Q, NT, this nurse to bedside to assess patient:   Afternoon Assessment:  Endorses: chronic confusion  Denies: HA, N/V, Lightheadedness/Dizziness, CP/SOB, N/T, GI/GU s/s,cough/congestion   1350: Therapy bedside working with patient, patient medications placed bedside for patient with requested Ginger-Ale  1700: This nurse to bedside to administer medication. Patient's PIV is removed and intact catheter is present on bedside table. RAC site assessed, no bleeding or leakage present. When asked about IV, pt states I don't know what happened   1750: This nurse to patient's room. Todd, son, present and states he is her to take patient home. This nurse unaware of such arrangements. Care team contacted, Dr. Caleen states this is okay and d/c order placed.   Please utilize this progress note for this nurses assessment (by exception). In the event that no assessment is charted within flowsheets.   Aayra Hornbaker, RN

## 2023-12-07 ENCOUNTER — Other Ambulatory Visit (HOSPITAL_COMMUNITY): Payer: Self-pay

## 2023-12-07 LAB — HEPATITIS B SURFACE ANTIBODY, QUANTITATIVE: Hep B S AB Quant (Post): 729 m[IU]/mL

## 2023-12-07 NOTE — TOC Transition Note (Signed)
 Transition of Care - Initial Contact after Hospitalization  Date of discharge: 12/06/2023  Date of contact: 12/07/23  Method: Phone Spoke to: Patient's Husband  Patient's husband contacted to discuss transition of care from recent inpatient hospitalization. Patient was admitted to [facility] from 12/03/23 to 12/06/23 discharge diagnosis of hypotension with possible UTI and NSTEMI.  The discharge medication list was reviewed. Patient's husband understands the changes and has no concerns.   Patient will return to her outpatient HD unit on: Wednesday 12/08/23 at [facility].  Charmaine Piety, NP

## 2023-12-07 NOTE — Progress Notes (Signed)
 Late Note Entry- December 07, 2023  Pt d/c yesterday evening. Contacted [facility] this morning to be advised of pt's d/c and that pt will need to resume at clinic on normal days/time until a determination from [facility] is known. Renal NP sent orders to clinic. Contacted pt's son via phone this morning as well. Son advised that pt has not been accepted at the [facility] [place] clinic at this time and that pt will need to resume at her normal clinic. Son aware that pt's clinic staff will provide updates regarding determination from [place] as information is received. Son aware pt will need to resume at [facility] tomorrow.   Randine Mungo Dialysis Navigator [phone]

## 2023-12-07 NOTE — Discharge Planning (Signed)
 [facility] Patient Discharge Orders- [facility] CLINIC: [facility]  Patient's name: Bailey [name] Admit/DC Dates: 12/03/2023 - 12/06/2023  Discharge Diagnoses: UTI - completed Ceftriaxone  course  NSTEMI/elevated troponins - severe aortic stenosis, ECHO pending Dementia  Aranesp : Given: No    Last Hgb: 10.7 PRBC's Given: Yes  Date/# of units: 2 unit PRBCs given on 10/11 ESA dose for discharge: Continue mircera 75 mcg IV q 2 weeks  IV Iron  dose at discharge: N/A  Heparin  change: No  EDW Change: No  Bath Change: No  Access intervention/Change: No  Hectorol  change: No  Discharge Labs: Calcium  8.5  Phosphorus 5.4   K+ 4.2  IV Antibiotics: No ABXs required with HD. Appears she completed Ceftriaxone  course at hosp  On Coumadin?: No   OTHER/APPTS/LAB ORDERS:  Please note plan for patient to live with son at dc. She's supoosed to be transferred to [facility] [place]. Appears SW is working on this transfer.    D/C Meds to be reconciled by nurse after every discharge.  Completed By: Charmaine Piety, NP  Reviewed by: MD:______ RN_______

## 2023-12-07 NOTE — Discharge Summary (Signed)
 Physician Discharge Summary  Bailey [name] FMW:[id number] DOB: [birth year] DOA: 12/03/2023  PCP: Arloa Jarvis, NP  Admit date: 12/03/2023 Discharge date: 12/06/23  Admitted From: Home Disposition: Home  Recommendations for Outpatient Follow-up:  Follow up with PCP in 1-2 weeks Please obtain BMP/CBC in one week your next doctors visit.  Outpatient follow-up with cardiology   Discharge Condition: Stable CODE STATUS: Full Diet recommendation: Diabetic/renal  Brief/Interim Summary: Brief Narrative:   73 year old with history of CKD stage V/ESRD, DDD, DM 2, HTN, HLD presented to the hospital from dialysis center due to low blood pressure.  Unable to complete full session.  Upon arrival hemodynamically stable.  She was found to have urinary tract infection and slight elevation in troponins.  Started on IV Rocephin , nephrology and cardiology were consulted.  She also had drop in hemoglobin without evidence of any bleeding therefore required PRBC transfusion, CTA GI bleed was negative.  Overall she was feeling well in the hospital tolerated IV Rocephin , blood pressure improved.  She was also dialyzed during this hospitalization.  There was a transient drop in hemoglobin work workup including CTA GI bleed was negative.  Case was discussed with GI who recommended outpatient follow-up.  Post HD she will did well and was cleared for discharge with outpatient follow-up.  Assessment & Plan:  Hypotension Urinary tract infection - I wonder this initial hypotension was secondary to placing her on dialysis in the setting of severe aortic stenosis.  Certainly could have been UTI but this has not been treated with 3 days of IV Rocephin  and patient is overall asymptomatic therefore we do not need any further antibiotics for now.  ESRD on hemodialysis - Nephrology consulted.  Status post HD 10/11.  May end up requiring midodrine prior to her hemodialysis sessions.  Elevated troponin/NSTEMI type  2 Severe aortic stenosis - Suspect demand ischemia echocardiogram shows preserved EF with severe aortic stenosis which has now progressed from last year.  Discussed with cardiology who recommends outpatient follow-up with structural heart team.  Acute on chronic anemia Thrombocytopenia -No obvious signs of bleeding.  Baseline hemoglobin 10, continues to drop hemoglobin, this morning down to 6.3.  2 units of PRBC transfusion ordered.  Hemoglobin is now back up to 11.0 and stable - CTA GI bleed is negative for any active bleeding - Discussed with GI, Dr. Leigh.  As long as hemoglobin remained stable, patient can follow-up outpatient but will be available if hemodynamically becomes unstable.  Bone marrow biopsy 2020 showed hypercellular bone marrow with trilineage marrow process  Essential hypertension - For now hold off on antihypertensives as blood pressure is on the softer side  Diabetes mellitus type 2 - Not on any home medications.  History of PAD Foot ulcers/amputations - Aspirin  and statin  Cognitive impairment - On donepezil   History of CVA Hyperlipidemia - On aspirin  and statin  DVT prophylaxis: SCDs Start: 12/03/23 2242    Code Status: Full Code Family Communication:  Tammy, uptodate.  Status is: Inpatient Discharge  PT Follow up Recs: Home Health Pt10/01/2024 1500; f65f completed.   Subjective: Seen in HD no complaints Examination:  General exam: Appears calm and comfortable  Respiratory system: Clear to auscultation. Respiratory effort normal. Cardiovascular system: S1 & S2 heard, RRR. No JVD, murmurs, rubs, gallops or clicks. No pedal edema. Gastrointestinal system: Abdomen is nondistended, soft and nontender. No organomegaly or masses felt. Normal bowel sounds heard. Central nervous system: Alert and oriented. No focal neurological deficits. Extremities: Symmetric 5 x 5 power. Skin:  No rashes, lesions or ulcers Psychiatry: Judgement and insight appear  normal. Mood & affect appropriate.    Discharge Diagnoses:  Principal Problem:   ESRD (end stage renal disease) (HCC) Active Problems:   Anemia   Severe aortic stenosis   Elevated troponin      Discharge Exam: Vitals:   12/06/23 1240 12/06/23 1642  BP: (!) 108/56 (!) 107/58  Pulse: 60 66  Resp: 19 19  Temp: 98.3 F (36.8 C) 98.1 F (36.7 C)  SpO2: 96% 95%   Vitals:   12/06/23 1146 12/06/23 1148 12/06/23 1240 12/06/23 1642  BP: (!) 117/56 (!) 119/57 (!) 108/56 (!) 107/58  Pulse: (!) 56 (!) 57 60 66  Resp: 18 19 19 19   Temp:  97.8 F (36.6 C) 98.3 F (36.8 C) 98.1 F (36.7 C)  TempSrc:      SpO2: 98% 97% 96% 95%  Weight:  48.7 kg    Height:          Discharge Instructions   Allergies as of 12/06/2023   No Known Allergies      Medication List     TAKE these medications    acetaminophen  500 MG tablet Commonly known as: TYLENOL  Take 500 mg by mouth every 6 (six) hours as needed for moderate pain (pain score 4-6).   amLODipine  10 MG tablet Commonly known as: NORVASC  Take 10 mg by mouth at bedtime.   ascorbic acid 500 MG tablet Commonly known as: VITAMIN C Take 500 mg by mouth daily.   aspirin  EC 81 MG tablet Take 1 tablet (81 mg total) by mouth daily. Swallow whole.   atorvastatin  40 MG tablet Commonly known as: LIPITOR TAKE 1 TABLET(40 MG) BY MOUTH DAILY   carvedilol  3.125 MG tablet Commonly known as: COREG  Take 3.125 mg by mouth 2 (two) times daily.   donepezil  10 MG tablet Commonly known as: ARICEPT  Take 10 mg by mouth at bedtime.   multivitamin Tabs tablet Take 1 tablet by mouth daily.   ondansetron  4 MG disintegrating tablet Commonly known as: ZOFRAN -ODT Take 4 mg by mouth 3 (three) times daily as needed for nausea or vomiting.   pantoprazole 40 MG tablet Commonly known as: PROTONIX Take 1 tablet (40 mg total) by mouth daily before breakfast.   sevelamer carbonate 800 MG tablet Commonly known as: RENVELA Take 1,600 mg by  mouth 3 (three) times daily.        Follow-up Information     Arloa Jarvis, NP Follow up in 1 week(s).   Specialty: Nurse Practitioner Contact information: [address] [place] [address] [phone]         [facility] Follow up.   Specialty: Home Health Services Why: Someone will call you to schedule first home visit. Contact information: [address] [place] [address] [phone]                No Known Allergies  You were cared for by a hospitalist during your hospital stay. If you have any questions about your discharge medications or the care you received while you were in the hospital after you are discharged, you can call the unit and asked to speak with the hospitalist on call if the hospitalist that took care of you is not available. Once you are discharged, your primary care physician will handle any further medical issues. Please note that no refills for any discharge medications will be authorized once you are discharged, as it  is imperative that you return to your primary care physician (or establish a relationship with a primary care physician if you do not have one) for your aftercare needs so that they can reassess your need for medications and monitor your lab values.  You were cared for by a hospitalist during your hospital stay. If you have any questions about your discharge medications or the care you received while you were in the hospital after you are discharged, you can call the unit and asked to speak with the hospitalist on call if the hospitalist that took care of you is not available. Once you are discharged, your primary care physician will handle any further medical issues. Please note that NO REFILLS for any discharge medications will be authorized once you are discharged, as it is imperative that you return to your primary care physician (or establish a relationship with a primary care physician if you do  not have one) for your aftercare needs so that they can reassess your need for medications and monitor your lab values.  Please request your Prim.MD to go over all Hospital Tests and Procedure/Radiological results at the follow up, please get all Hospital records sent to your Prim MD by signing hospital release before you go home.  Get CBC, CMP, 2 view Chest X ray checked  by Primary MD during your next visit or SNF MD in 5-7 days ( we routinely change or add medications that can affect your baseline labs and fluid status, therefore we recommend that you get the mentioned basic workup next visit with your PCP, your PCP may decide not to get them or add new tests based on their clinical decision)  On your next visit with your primary care physician please Get Medicines reviewed and adjusted.  If you experience worsening of your admission symptoms, develop shortness of breath, life threatening emergency, suicidal or homicidal thoughts you must seek medical attention immediately by calling 911 or calling your MD immediately  if symptoms less severe.  You Must read complete instructions/literature along with all the possible adverse reactions/side effects for all the Medicines you take and that have been prescribed to you. Take any new Medicines after you have completely understood and accpet all the possible adverse reactions/side effects.   Do not drive, operate heavy machinery, perform activities at heights, swimming or participation in water activities or provide baby sitting services if your were admitted for syncope or siezures until you have seen by Primary MD or a Neurologist and advised to do so again.  Do not drive when taking Pain medications.   Procedures/Studies: ECHOCARDIOGRAM COMPLETE Result Date: 12/05/2023    ECHOCARDIOGRAM REPORT   Patient Name:   TAWNY Bailey [name] [facility] Date of Exam: 12/05/2023 Medical Rec #:  [id number]      Height:       64.0 in Accession #:    [id number]     Weight:        108.0 lb Date of Birth:  [birth year]      BSA:          1.506 m Patient Age:    73 years       BP:           115/54 mmHg Patient Gender: F              HR:           56 bpm. Exam Location:  Inpatient Procedure: 2D Echo, Cardiac Doppler and Color Doppler (Both Spectral and Color  Flow Doppler were utilized during procedure). Indications:    Cardiomyopathy Uncpecified I42.9  History:        Patient has prior history of Echocardiogram examinations, most                 recent 07/12/2022.  Sonographer:    Tinnie Gosling RDCS Referring Phys: [id number] ROLLO JONELLE LOUDER IMPRESSIONS  1. Left ventricular ejection fraction, by estimation, is 55%. The left ventricle has normal function. The left ventricle has no regional wall motion abnormalities. There is moderate concentric left ventricular hypertrophy. Left ventricular diastolic parameters are indeterminate.  2. Right ventricular systolic function is normal. The right ventricular size is normal. Tricuspid regurgitation signal is inadequate for assessing PA pressure.  3. Left atrial size was severely dilated.  4. The mitral valve is degenerative. Moderate mitral valve regurgitation.  5. The aortic valve is tricuspid. There is moderate calcification of the aortic valve. Aortic valve regurgitation is trivial. Severe aortic valve stenosis. Aortic valve mean gradient measures 47.0 mmHg. Dimentionless index 0.14.  6. The inferior vena cava is dilated in size with <50% respiratory variability, suggesting right atrial pressure of 15 mmHg. Comparison(s): Prior images reviewed side by side. LVEF approximately 55%. Aortic stenosis has progressed, severe with mean AV gradient 47 mmHg and DI 0.14. FINDINGS  Left Ventricle: Left ventricular ejection fraction, by estimation, is 55%. The left ventricle has normal function. The left ventricle has no regional wall motion abnormalities. The left ventricular internal cavity size was normal in size. There is moderate concentric left  ventricular hypertrophy. Left ventricular diastolic function could not be evaluated due to mitral annular calcification (moderate or greater). Left ventricular diastolic parameters are indeterminate. Right Ventricle: The right ventricular size is normal. No increase in right ventricular wall thickness. Right ventricular systolic function is normal. Tricuspid regurgitation signal is inadequate for assessing PA pressure. Left Atrium: Left atrial size was severely dilated. Right Atrium: Right atrial size was normal in size. Pericardium: Trivial pericardial effusion is present. The pericardial effusion is anterior to the right ventricle. Mitral Valve: The mitral valve is degenerative in appearance. Moderate mitral valve regurgitation. MV peak gradient, 13.7 mmHg. The mean mitral valve gradient is 4.0 mmHg. Tricuspid Valve: The tricuspid valve is grossly normal. Tricuspid valve regurgitation is mild. Aortic Valve: The aortic valve is tricuspid. There is moderate calcification of the aortic valve. There is mild to moderate aortic valve annular calcification. Aortic valve regurgitation is trivial. Severe aortic stenosis is present. Aortic valve mean gradient measures 47.0 mmHg. Aortic valve peak gradient measures 75.0 mmHg. Aortic valve area, by VTI measures 0.42 cm. Pulmonic Valve: The pulmonic valve was grossly normal. Pulmonic valve regurgitation is trivial. Aorta: The aortic root and ascending aorta are structurally normal, with no evidence of dilitation. Venous: The inferior vena cava is dilated in size with less than 50% respiratory variability, suggesting right atrial pressure of 15 mmHg. IAS/Shunts: No atrial level shunt detected by color flow Doppler. Additional Comments: 3D was performed not requiring image post processing on an independent workstation and was indeterminate.  LEFT VENTRICLE PLAX 2D LVIDd:         4.94 cm   Diastology LVIDs:         3.91 cm   LV e' medial:    3.37 cm/s LV PW:         1.38 cm   LV  E/e' medial:  51.3 LV IVS:        1.38 cm   LV e' lateral:  3.37 cm/s LVOT diam:     1.94 cm   LV E/e' lateral: 51.3 LV SV:         46 LV SV Index:   30 LVOT Area:     2.96 cm  RIGHT VENTRICLE             IVC RV S prime:     12.90 cm/s  IVC diam: 2.94 cm TAPSE (M-mode): 2.1 cm LEFT ATRIUM              Index        RIGHT ATRIUM           Index LA diam:        4.30 cm  2.86 cm/m   RA Area:     17.10 cm LA Vol (A2C):   128.0 ml 85.01 ml/m  RA Volume:   45.50 ml  30.22 ml/m LA Vol (A4C):   77.6 ml  51.53 ml/m LA Biplane Vol: 103.0 ml 68.40 ml/m  AORTIC VALVE AV Area (Vmax):    0.39 cm AV Area (Vmean):   0.38 cm AV Area (VTI):     0.42 cm AV Vmax:           433.00 cm/s AV Vmean:          326.000 cm/s AV VTI:            1.080 m AV Peak Grad:      75.0 mmHg AV Mean Grad:      47.0 mmHg LVOT Vmax:         56.70 cm/s LVOT Vmean:        41.500 cm/s LVOT VTI:          0.154 m LVOT/AV VTI ratio: 0.14  AORTA Ao Root diam: 2.67 cm Ao Asc diam:  3.66 cm MITRAL VALVE MV Area (PHT): 2.22 cm     SHUNTS MV Area VTI:   0.78 cm     Systemic VTI:  0.15 m MV Peak grad:  13.7 mmHg    Systemic Diam: 1.94 cm MV Mean grad:  4.0 mmHg MV Vmax:       1.85 m/s MV Vmean:      95.6 cm/s MV Decel Time: 341 msec MV E velocity: 173.00 cm/s MV A velocity: 87.30 cm/s MV E/A ratio:  1.98 Jayson Sierras MD Electronically signed by Jayson Sierras MD Signature Date/Time: 12/05/2023/1:41:05 PM    Final    CT ANGIO GI BLEED Result Date: 12/04/2023 EXAM: CTA ABDOMEN AND PELVIS WITH CONTRAST 12/04/2023 12:06:54 PM TECHNIQUE: CTA images of the abdomen and pelvis with 75 mL of iohexol  (OMNIPAQUE ) 350 MG/ML intravenous contrast. Three-dimensional MIP/volume rendered formations were performed. Automated exposure control, iterative reconstruction, and/or weight based adjustment of the mA/kV was utilized to reduce the radiation dose to as low as reasonably achievable. COMPARISON: Renal stone protocol 05/10/2021 and CT abdomen and pelvis with  contrast 04/27/2021. CLINICAL HISTORY: Rule out GI bleed. Chief complaints: Hypotension. FINDINGS: VASCULATURE: GI BLEED: No active extravasation of contrast within the GI tract. AORTA: Atherosclerotic calcifications are present in the abdominal aorta. No aneurysm is present. No dissection. CELIAC TRUNK: Calcifications at the origin of the celiac artery without significant stenosis. SUPERIOR MESENTERIC ARTERY: Calcifications at the origin of the SMA result in less than 50% stenosis relative to the more distal vessel. Diffuse distal calcifications are present. INFERIOR MESENTERIC ARTERY: Atherosclerotic calcifications are present in the IMA. The origin is unremarkable. RENAL ARTERIES: Calcifications are present at the origin of the  renal arteries, left greater than right. Calcifications extend into the arteries bilaterally without focal stenosis or aneurysm. ILIAC ARTERIES: No acute finding. No occlusion or significant stenosis. ABDOMEN/PELVIS: LOWER CHEST: The heart is mildly enlarged. Stable right effusion and associated atelectasis is present. Mild-dependent atelectasis is present bilaterally. LIVER: The liver is unremarkable. GALLBLADDER AND BILE DUCTS: Gallbladder is unremarkable. No biliary ductal dilatation. SPLEEN: The spleen is unremarkable. PANCREAS: The pancreas is unremarkable. ADRENAL GLANDS: Bilateral adrenal glands demonstrate no acute abnormality. KIDNEYS, URETERS AND BLADDER: Renal atrophy is present bilaterally. No discrete lesions are present. No stones in the kidneys or ureters. No hydronephrosis. No perinephric or periureteral stranding. Urinary bladder is unremarkable. GI AND BOWEL: Stomach and duodenal sweep demonstrate no acute abnormality. There is no bowel obstruction. No abnormal bowel wall thickening or distension. REPRODUCTIVE: Reproductive organs are unremarkable. PERITONEUM AND RETROPERITONEUM: No ascites or free air. LYMPH NODES: No lymphadenopathy. BONES AND SOFT TISSUES: No acute  abnormality of the bones. No acute soft tissue abnormality. IMPRESSION: 1. No active GI bleeding. 2. Atherosclerotic disease with less than 50% stenosis at the SMA origin; no aneurysm or hemodynamically significant stenosis elsewhere. Electronically signed by: Lonni Necessary MD 12/04/2023 12:39 PM EDT RP Workstation: [id number]   DG Chest Port 1 View Result Date: 12/03/2023 CLINICAL DATA:  Weakness EXAM: PORTABLE CHEST 1 VIEW COMPARISON:  Chest radiograph February 12, 2023. FINDINGS: Cardiomediastinal silhouette is enlarged and increased in size to prior. Aortic knob is calcified. Bilateral vascular congestion and prominent right hilum. Blunting of left costophrenic angle suggestive of small effusion/atelectasis. No acute osseous abnormality. IMPRESSION: Cardiomegaly and pulmonary congestion, progressed to prior. New left small pleural effusion is suspected. Electronically Signed   By: Megan  Zare M.D.   On: 12/03/2023 18:50   CT Lumbar Spine Wo Contrast Result Date: 11/15/2023 EXAM: CT OF THE LUMBAR SPINE WITHOUT CONTRAST 11/15/2023 07:26:02 PM TECHNIQUE: CT of the lumbar spine was performed without the administration of intravenous contrast. Multiplanar reformatted images are provided for review. Automated exposure control, iterative reconstruction, and/or weight based adjustment of the mA/kV was utilized to reduce the radiation dose to as low as reasonably achievable. COMPARISON: None available. CLINICAL HISTORY: Back trauma, no prior imaging (Age >= 16y). Patient BIB [facility] from home, fell 6 days ago, has sacral and L hip pain with standing, rotation. Hx of dementia, lives at home with husband. FINDINGS: BONES AND ALIGNMENT: Normal vertebral body heights. No acute fracture or suspicious bone lesion. Normal alignment. DEGENERATIVE CHANGES: Mild degenerative changes at L5-S1. SOFT TISSUES: No acute abnormality. Multiple bowel sutures in the right mid abdomen. VASCULATURE: Atherosclerotic calcifications  of the abdominal aorta and branch vessels. STOMACH AND BOWEL: Extensive colonic diverticulosis, without evidence of diverticulitis. IMPRESSION: 1. No acute findings. 2. Mild degenerative changes at L5-S1. Electronically signed by: Pinkie Pebbles MD 11/15/2023 08:06 PM EDT RP Workstation: [id number]   CT PELVIS WO CONTRAST Result Date: 11/15/2023 CLINICAL DATA:  Fall. EXAM: CT PELVIS WITHOUT CONTRAST TECHNIQUE: Multidetector CT imaging of the pelvis was performed following the standard protocol without intravenous contrast. RADIATION DOSE REDUCTION: This exam was performed according to the departmental dose-optimization program which includes automated exposure control, adjustment of the mA and/or kV according to patient size and/or use of iterative reconstruction technique. COMPARISON:  CT abdomen and pelvis 04/27/2021. FINDINGS: Urinary Tract: There is diffuse bladder wall thickening versus normal under distention. Bowel: No acute bowel pathology. There is diffuse colonic diverticulosis. Small bowel anastomosis is present. Vascular/Lymphatic: There are extensive vascular calcifications including peripheral vascular calcifications  throughout the abdomen and pelvis. Visualized aorta is normal in size. No enlarged lymph nodes are identified. Reproductive: There are prominent vascular calcifications in the uterus. Adnexa are within normal limits. Other: Pelvic floor relaxation present. There is no ascites or focal abdominal wall hernia. Musculoskeletal: The bones are diffusely osteopenic. There is no acute fracture or dislocation identified. No focal osseous lesions are seen. Degenerative changes affect the bilateral sacroiliac joints. IMPRESSION: 1. No acute fracture or dislocation. 2. Diffuse bladder wall thickening versus normal under distention. Correlate clinically for cystitis. 3. Colonic diverticulosis. 4. Pelvic floor relaxation. 5. Extensive vascular calcifications. Electronically Signed   By: Greig Pique M.D.   On: 11/15/2023 19:53   DG Hip Unilat W or Wo Pelvis 2-3 Views Left Result Date: 11/15/2023 CLINICAL DATA:  Fall 6 days ago. EXAM: DG HIP (WITH OR WITHOUT PELVIS) 2-3V LEFT COMPARISON:  Left hip x-ray 07/27/2021. FINDINGS: The bones are diffusely osteopenic. There is no acute fracture or dislocation identified. Joint spaces are well maintained. Extensive peripheral vascular calcifications are present. IMPRESSION: 1. No acute fracture or dislocation. 2. Evaluation is limited by osteopenia. If there is high clinical concern for occult fracture, CT would be more sensitive. Electronically Signed   By: Greig Pique M.D.   On: 11/15/2023 17:21     The results of significant diagnostics from this hospitalization (including imaging, microbiology, ancillary and laboratory) are listed below for reference.     Microbiology: Recent Results (from the past 240 hours)  MRSA Next Gen by PCR, Nasal     Status: None   Collection Time: 12/05/23  6:29 AM   Specimen: Nasal Mucosa; Nasal Swab  Result Value Ref Range Status   MRSA by PCR Next Gen NOT DETECTED NOT DETECTED Final    Comment: (NOTE) The GeneXpert MRSA Assay (FDA approved for NASAL specimens only), is one component of a comprehensive MRSA colonization surveillance program. It is not intended to diagnose MRSA infection nor to guide or monitor treatment for MRSA infections. Test performance is not FDA approved in patients less than 70 years old. Performed at [facility], [address] [place], [place] [address]      Labs: BNP (last 3 results) No results for input(s): BNP in the last 8760 hours. Basic Metabolic Panel: Recent Labs  Lab 12/03/23 1626 12/04/23 0120 12/05/23 0834 12/06/23 0446  NA 136 138 138 134*  K 3.5 3.7 4.4 4.2  CL 95* 95* 94* 93*  CO2 27 25 24 26   GLUCOSE 100* 105* 119* 150*  BUN 23 27* 26* 35*  CREATININE 2.97* 3.39* 3.48* 4.61*  CALCIUM  8.2* 8.1* 8.6* 8.5*  MG  --   --  2.0 2.2  PHOS   --   --  5.4*  --    Liver Function Tests: Recent Labs  Lab 12/03/23 1626  AST 43*  ALT 136*  ALKPHOS 76  BILITOT 1.3*  PROT 6.7  ALBUMIN 3.2*   No results for input(s): LIPASE, AMYLASE in the last 168 hours. No results for input(s): AMMONIA in the last 168 hours. CBC: Recent Labs  Lab 12/04/23 0120 12/04/23 0811 12/04/23 2129 12/05/23 0447 12/06/23 0446  WBC 11.6* 9.6 9.5 8.1 7.4  HGB 6.3* 11.3* 11.4* 11.0* 10.7*  HCT 20.7* 35.3* 36.0 34.1* 33.3*  MCV 103.0* 94.4 95.5 94.7 94.9  PLT 131* 117* 110* 101* 108*   Cardiac Enzymes: No results for input(s): CKTOTAL, CKMB, CKMBINDEX, TROPONINI in the last 168 hours. BNP: Invalid input(s): POCBNP CBG:  Recent Labs  Lab 12/03/23 1745 12/04/23 1834  GLUCAP 84 113*   D-Dimer No results for input(s): DDIMER in the last 72 hours. Hgb A1c No results for input(s): HGBA1C in the last 72 hours. Lipid Profile No results for input(s): CHOL, HDL, LDLCALC, TRIG, CHOLHDL, LDLDIRECT in the last 72 hours. Thyroid  function studies No results for input(s): TSH, T4TOTAL, T3FREE, THYROIDAB in the last 72 hours.  Invalid input(s): FREET3 Anemia work up No results for input(s): VITAMINB12, FOLATE, FERRITIN, TIBC, IRON , RETICCTPCT in the last 72 hours. Urinalysis    Component Value Date/Time   COLORURINE AMBER (A) 12/04/2023 0120   APPEARANCEUR TURBID (A) 12/04/2023 0120   LABSPEC 1.015 12/04/2023 0120   PHURINE 7.0 12/04/2023 0120   GLUCOSEU NEGATIVE 12/04/2023 0120   HGBUR SMALL (A) 12/04/2023 0120   BILIRUBINUR NEGATIVE 12/04/2023 0120   BILIRUBINUR small 02/10/2014 1127   KETONESUR NEGATIVE 12/04/2023 0120   PROTEINUR 100 (A) 12/04/2023 0120   UROBILINOGEN 1.0 02/10/2014 2233   NITRITE NEGATIVE 12/04/2023 0120   LEUKOCYTESUR TRACE (A) 12/04/2023 0120   Sepsis Labs Recent Labs  Lab 12/04/23 0811 12/04/23 2129 12/05/23 0447 12/06/23 0446  WBC 9.6 9.5 8.1 7.4    Microbiology Recent Results (from the past 240 hours)  MRSA Next Gen by PCR, Nasal     Status: None   Collection Time: 12/05/23  6:29 AM   Specimen: Nasal Mucosa; Nasal Swab  Result Value Ref Range Status   MRSA by PCR Next Gen NOT DETECTED NOT DETECTED Final    Comment: (NOTE) The GeneXpert MRSA Assay (FDA approved for NASAL specimens only), is one component of a comprehensive MRSA colonization surveillance program. It is not intended to diagnose MRSA infection nor to guide or monitor treatment for MRSA infections. Test performance is not FDA approved in patients less than 43 years old. Performed at [facility], [address] [place], [place] [address]      Time coordinating discharge:  I have spent 35 minutes face to face with the patient and on the ward discussing the patients care, assessment, plan and disposition with other care givers. >50% of the time was devoted counseling the patient about the risks and benefits of treatment/Discharge disposition and coordinating care.   SIGNED:   Burgess JAYSON Dare, MD  [facility] 12/07/2023, 1:32 PM   If 7PM-7AM, please contact night-coverage

## 2023-12-07 NOTE — Progress Notes (Deleted)
 Cardiology Office Note   Date:  12/07/2023  ID:  Bailey [name], DOB [birth year], MRN [id number] PCP: Arloa Jarvis, NP  [facility] Providers Cardiologist:  Madonna Large, DO      History of Present Illness Bailey [name] is a 73 y.o. female with a past medical history of type 2 DM, HTN, GERD, ESRD, recent type 2 NSTEMI, anemia, severe aortic stenosis, moderate mitral regurgitation, PAD, history of CVA, HLD. Patient followed by Dr. Large and presents today for a hospital follow up appointment  She has a history of PAD and has been followed by Vascular surgery in the past. Previously had bilateral great toe amputations.   Patient previously had echocardiogram in 06/2017 that showed EF 65-70%, no regional wall motion abnormalities, normal RV systolic function, normal PA systolic pressure, mild MR, moderate AS. Carotid ultrasounds at that time showed 1-39% stenosis in bilateral ICAs  Patient was admitted from 10/10-10/13/25. Had presented with low BP from dialysis and had been unable to complete full session. She was found to have a UTI, treated with IV rocephin . He had a drop in hemoglobin. hsTn [id number]. Echocardiogram 12/05/23 showed EF 55% with no regional wall motion abnormalities, moderate LVH, normal RV systolic function, moderate MR, severe aortic stenosis with mean gradient 47 and DI 0.14. Patient was treated with IV heparin , but this was discontinued as it was suspected trop elevation was demand. She also became anemic with hemoglobin as low as 6.3. Patient was not felt to be a cath candidate due to anemia and thrombocytopenia. Referred to structural heart team at [facility]   Severe AS  Moderate MR  - echocardiogram 10/12 showed moderate MR, severe aortic stenosis with mean gradient 47 and DI 0.14 - Seeing Dr. Wonda on 10/30   Recent type 2 NSTEMI  - Admitted 10/10-10/13 with UTI, anemia with hemoglobin 6.3. hsTn 558>575>537 - Echocardiogram 12/05/23 showed EF 55% with no regional  wall motion abnormalities, moderate LVH, normal RV systolic function - Patient was treated with IV heparin , but this was stopped due to anemia. Not a cath candidate due to anemia and thrombocytopenia   PAD s/p bilateral great toe amputations  Mild bilateral carotid artery stenosis  History of CVA  HLD - Lipid panel from 06/2022 showed LDL 46, HDL 37, triglycerides 91, total cholesterol 101  - Continue lipitor 40 mg daily, ASA 81 mg daily   ESRD on HD  - Followed by nephrology   Anemia  - Anemia as low as 6.3 during recent admission. Had improved to 10.7 on 10/13  -   ROS: ***  Studies Reviewed      *** Risk Assessment/Calculations {Does this patient have ATRIAL FIBRILLATION?[phone]}         Physical Exam VS:  LMP  (LMP Unknown)        Wt Readings from Last 3 Encounters:  12/06/23 107 lb 5.8 oz (48.7 kg)  02/12/23 110 lb 3.7 oz (50 kg)  02/11/23 110 lb (49.9 kg)    GEN: Well nourished, well developed in no acute distress NECK: No JVD; No carotid bruits CARDIAC: ***RRR, no murmurs, rubs, gallops RESPIRATORY:  Clear to auscultation without rales, wheezing or rhonchi  ABDOMEN: Soft, non-tender, non-distended EXTREMITIES:  No edema; No deformity   ASSESSMENT AND PLAN ***    {Are you ordering a CV Procedure (e.g. stress test, cath, DCCV, TEE, etc)?   Press F2        :[phone]}  Dispo: ***  Signed, Rollo FABIENE Louder, PA-C

## 2023-12-08 DIAGNOSIS — N186 End stage renal disease: Secondary | ICD-10-CM | POA: Diagnosis not present

## 2023-12-08 DIAGNOSIS — Z992 Dependence on renal dialysis: Secondary | ICD-10-CM | POA: Diagnosis not present

## 2023-12-09 DIAGNOSIS — I120 Hypertensive chronic kidney disease with stage 5 chronic kidney disease or end stage renal disease: Secondary | ICD-10-CM | POA: Diagnosis not present

## 2023-12-09 DIAGNOSIS — E11319 Type 2 diabetes mellitus with unspecified diabetic retinopathy without macular edema: Secondary | ICD-10-CM | POA: Diagnosis not present

## 2023-12-09 DIAGNOSIS — Z7982 Long term (current) use of aspirin: Secondary | ICD-10-CM | POA: Diagnosis not present

## 2023-12-09 DIAGNOSIS — I350 Nonrheumatic aortic (valve) stenosis: Secondary | ICD-10-CM | POA: Diagnosis not present

## 2023-12-09 DIAGNOSIS — I214 Non-ST elevation (NSTEMI) myocardial infarction: Secondary | ICD-10-CM | POA: Diagnosis not present

## 2023-12-09 DIAGNOSIS — N390 Urinary tract infection, site not specified: Secondary | ICD-10-CM | POA: Diagnosis not present

## 2023-12-09 DIAGNOSIS — R32 Unspecified urinary incontinence: Secondary | ICD-10-CM | POA: Diagnosis not present

## 2023-12-09 DIAGNOSIS — F32A Depression, unspecified: Secondary | ICD-10-CM | POA: Diagnosis not present

## 2023-12-09 DIAGNOSIS — Z89412 Acquired absence of left great toe: Secondary | ICD-10-CM | POA: Diagnosis not present

## 2023-12-09 DIAGNOSIS — E559 Vitamin D deficiency, unspecified: Secondary | ICD-10-CM | POA: Diagnosis not present

## 2023-12-09 DIAGNOSIS — Z89411 Acquired absence of right great toe: Secondary | ICD-10-CM | POA: Diagnosis not present

## 2023-12-09 DIAGNOSIS — N179 Acute kidney failure, unspecified: Secondary | ICD-10-CM | POA: Diagnosis not present

## 2023-12-09 DIAGNOSIS — E538 Deficiency of other specified B group vitamins: Secondary | ICD-10-CM | POA: Diagnosis not present

## 2023-12-09 DIAGNOSIS — E1151 Type 2 diabetes mellitus with diabetic peripheral angiopathy without gangrene: Secondary | ICD-10-CM | POA: Diagnosis not present

## 2023-12-09 DIAGNOSIS — E440 Moderate protein-calorie malnutrition: Secondary | ICD-10-CM | POA: Diagnosis not present

## 2023-12-09 DIAGNOSIS — E1122 Type 2 diabetes mellitus with diabetic chronic kidney disease: Secondary | ICD-10-CM | POA: Diagnosis not present

## 2023-12-09 DIAGNOSIS — Z8673 Personal history of transient ischemic attack (TIA), and cerebral infarction without residual deficits: Secondary | ICD-10-CM | POA: Diagnosis not present

## 2023-12-09 DIAGNOSIS — N186 End stage renal disease: Secondary | ICD-10-CM | POA: Diagnosis not present

## 2023-12-09 DIAGNOSIS — M51369 Other intervertebral disc degeneration, lumbar region without mention of lumbar back pain or lower extremity pain: Secondary | ICD-10-CM | POA: Diagnosis not present

## 2023-12-09 DIAGNOSIS — I959 Hypotension, unspecified: Secondary | ICD-10-CM | POA: Diagnosis not present

## 2023-12-09 DIAGNOSIS — E1142 Type 2 diabetes mellitus with diabetic polyneuropathy: Secondary | ICD-10-CM | POA: Diagnosis not present

## 2023-12-09 DIAGNOSIS — Z992 Dependence on renal dialysis: Secondary | ICD-10-CM | POA: Diagnosis not present

## 2023-12-09 DIAGNOSIS — D696 Thrombocytopenia, unspecified: Secondary | ICD-10-CM | POA: Diagnosis not present

## 2023-12-09 DIAGNOSIS — E785 Hyperlipidemia, unspecified: Secondary | ICD-10-CM | POA: Diagnosis not present

## 2023-12-09 DIAGNOSIS — D631 Anemia in chronic kidney disease: Secondary | ICD-10-CM | POA: Diagnosis not present

## 2023-12-10 DIAGNOSIS — N186 End stage renal disease: Secondary | ICD-10-CM | POA: Diagnosis not present

## 2023-12-10 DIAGNOSIS — Z992 Dependence on renal dialysis: Secondary | ICD-10-CM | POA: Diagnosis not present

## 2023-12-11 DIAGNOSIS — R579 Shock, unspecified: Secondary | ICD-10-CM | POA: Diagnosis not present

## 2023-12-11 DIAGNOSIS — I120 Hypertensive chronic kidney disease with stage 5 chronic kidney disease or end stage renal disease: Secondary | ICD-10-CM | POA: Diagnosis not present

## 2023-12-11 DIAGNOSIS — I350 Nonrheumatic aortic (valve) stenosis: Secondary | ICD-10-CM | POA: Diagnosis present

## 2023-12-11 DIAGNOSIS — R1030 Lower abdominal pain, unspecified: Secondary | ICD-10-CM | POA: Diagnosis not present

## 2023-12-11 DIAGNOSIS — D696 Thrombocytopenia, unspecified: Secondary | ICD-10-CM | POA: Diagnosis present

## 2023-12-11 DIAGNOSIS — E872 Acidosis, unspecified: Secondary | ICD-10-CM | POA: Diagnosis present

## 2023-12-11 DIAGNOSIS — R931 Abnormal findings on diagnostic imaging of heart and coronary circulation: Secondary | ICD-10-CM | POA: Diagnosis not present

## 2023-12-11 DIAGNOSIS — I21A1 Myocardial infarction type 2: Secondary | ICD-10-CM | POA: Diagnosis present

## 2023-12-11 DIAGNOSIS — R918 Other nonspecific abnormal finding of lung field: Secondary | ICD-10-CM | POA: Diagnosis not present

## 2023-12-11 DIAGNOSIS — I340 Nonrheumatic mitral (valve) insufficiency: Secondary | ICD-10-CM | POA: Diagnosis not present

## 2023-12-11 DIAGNOSIS — Z452 Encounter for adjustment and management of vascular access device: Secondary | ICD-10-CM | POA: Diagnosis not present

## 2023-12-11 DIAGNOSIS — I132 Hypertensive heart and chronic kidney disease with heart failure and with stage 5 chronic kidney disease, or end stage renal disease: Secondary | ICD-10-CM | POA: Diagnosis present

## 2023-12-11 DIAGNOSIS — R0902 Hypoxemia: Secondary | ICD-10-CM | POA: Diagnosis not present

## 2023-12-11 DIAGNOSIS — N390 Urinary tract infection, site not specified: Secondary | ICD-10-CM | POA: Diagnosis present

## 2023-12-11 DIAGNOSIS — E1122 Type 2 diabetes mellitus with diabetic chronic kidney disease: Secondary | ICD-10-CM | POA: Diagnosis present

## 2023-12-11 DIAGNOSIS — R570 Cardiogenic shock: Secondary | ICD-10-CM | POA: Diagnosis present

## 2023-12-11 DIAGNOSIS — A419 Sepsis, unspecified organism: Secondary | ICD-10-CM | POA: Diagnosis not present

## 2023-12-11 DIAGNOSIS — D649 Anemia, unspecified: Secondary | ICD-10-CM | POA: Diagnosis present

## 2023-12-11 DIAGNOSIS — Z992 Dependence on renal dialysis: Secondary | ICD-10-CM | POA: Diagnosis not present

## 2023-12-11 DIAGNOSIS — Z66 Do not resuscitate: Secondary | ICD-10-CM | POA: Diagnosis present

## 2023-12-11 DIAGNOSIS — J811 Chronic pulmonary edema: Secondary | ICD-10-CM | POA: Diagnosis present

## 2023-12-11 DIAGNOSIS — I959 Hypotension, unspecified: Secondary | ICD-10-CM | POA: Diagnosis not present

## 2023-12-11 DIAGNOSIS — I5021 Acute systolic (congestive) heart failure: Secondary | ICD-10-CM | POA: Diagnosis present

## 2023-12-11 DIAGNOSIS — R6521 Severe sepsis with septic shock: Secondary | ICD-10-CM | POA: Diagnosis not present

## 2023-12-11 DIAGNOSIS — Z87440 Personal history of urinary (tract) infections: Secondary | ICD-10-CM | POA: Diagnosis not present

## 2023-12-11 DIAGNOSIS — E875 Hyperkalemia: Secondary | ICD-10-CM | POA: Diagnosis present

## 2023-12-11 DIAGNOSIS — F03A0 Unspecified dementia, mild, without behavioral disturbance, psychotic disturbance, mood disturbance, and anxiety: Secondary | ICD-10-CM | POA: Diagnosis present

## 2023-12-11 DIAGNOSIS — N186 End stage renal disease: Secondary | ICD-10-CM | POA: Diagnosis present

## 2023-12-11 DIAGNOSIS — G8929 Other chronic pain: Secondary | ICD-10-CM | POA: Diagnosis present

## 2023-12-11 DIAGNOSIS — J9601 Acute respiratory failure with hypoxia: Secondary | ICD-10-CM | POA: Diagnosis present

## 2023-12-21 ENCOUNTER — Ambulatory Visit: Admitting: Cardiology

## 2023-12-23 ENCOUNTER — Ambulatory Visit: Admitting: Cardiovascular Disease

## 2023-12-25 DEATH — deceased

## 2024-02-07 ENCOUNTER — Other Ambulatory Visit (HOSPITAL_COMMUNITY): Payer: Self-pay
